# Patient Record
Sex: Male | Born: 1950 | Race: Black or African American | Hispanic: No | Smoking: Never smoker
Health system: Southern US, Community
[De-identification: ages and names within clinical notes are randomized; demographics above are authoritative.]

## PROBLEM LIST (undated history)

## (undated) DIAGNOSIS — N401 Enlarged prostate with lower urinary tract symptoms: Secondary | ICD-10-CM

## (undated) DIAGNOSIS — K3184 Gastroparesis: Secondary | ICD-10-CM

## (undated) DIAGNOSIS — G4730 Sleep apnea, unspecified: Secondary | ICD-10-CM

## (undated) DIAGNOSIS — N183 Chronic kidney disease, stage 3 unspecified: Secondary | ICD-10-CM

## (undated) DIAGNOSIS — M549 Dorsalgia, unspecified: Secondary | ICD-10-CM

## (undated) DIAGNOSIS — T8859XA Other complications of anesthesia, initial encounter: Secondary | ICD-10-CM

## (undated) DIAGNOSIS — K648 Other hemorrhoids: Secondary | ICD-10-CM

## (undated) DIAGNOSIS — E785 Hyperlipidemia, unspecified: Secondary | ICD-10-CM

## (undated) DIAGNOSIS — R42 Dizziness and giddiness: Secondary | ICD-10-CM

## (undated) DIAGNOSIS — I2510 Atherosclerotic heart disease of native coronary artery without angina pectoris: Secondary | ICD-10-CM

## (undated) DIAGNOSIS — I209 Angina pectoris, unspecified: Secondary | ICD-10-CM

## (undated) DIAGNOSIS — R5383 Other fatigue: Secondary | ICD-10-CM

## (undated) DIAGNOSIS — N138 Other obstructive and reflux uropathy: Secondary | ICD-10-CM

## (undated) DIAGNOSIS — I509 Heart failure, unspecified: Secondary | ICD-10-CM

## (undated) DIAGNOSIS — K625 Hemorrhage of anus and rectum: Secondary | ICD-10-CM

## (undated) DIAGNOSIS — T4145XA Adverse effect of unspecified anesthetic, initial encounter: Secondary | ICD-10-CM

## (undated) DIAGNOSIS — R6883 Chills (without fever): Secondary | ICD-10-CM

## (undated) DIAGNOSIS — Z8679 Personal history of other diseases of the circulatory system: Secondary | ICD-10-CM

## (undated) DIAGNOSIS — R0609 Other forms of dyspnea: Secondary | ICD-10-CM

## (undated) DIAGNOSIS — F4310 Post-traumatic stress disorder, unspecified: Secondary | ICD-10-CM

## (undated) DIAGNOSIS — T7840XA Allergy, unspecified, initial encounter: Secondary | ICD-10-CM

## (undated) DIAGNOSIS — C61 Malignant neoplasm of prostate: Secondary | ICD-10-CM

## (undated) DIAGNOSIS — R1310 Dysphagia, unspecified: Secondary | ICD-10-CM

## (undated) DIAGNOSIS — R110 Nausea: Secondary | ICD-10-CM

## (undated) DIAGNOSIS — I639 Cerebral infarction, unspecified: Secondary | ICD-10-CM

## (undated) DIAGNOSIS — I219 Acute myocardial infarction, unspecified: Secondary | ICD-10-CM

## (undated) DIAGNOSIS — R51 Headache: Secondary | ICD-10-CM

## (undated) DIAGNOSIS — R5381 Other malaise: Secondary | ICD-10-CM

## (undated) DIAGNOSIS — E669 Obesity, unspecified: Secondary | ICD-10-CM

## (undated) DIAGNOSIS — R0989 Other specified symptoms and signs involving the circulatory and respiratory systems: Secondary | ICD-10-CM

## (undated) DIAGNOSIS — I10 Essential (primary) hypertension: Secondary | ICD-10-CM

## (undated) DIAGNOSIS — M1990 Unspecified osteoarthritis, unspecified site: Secondary | ICD-10-CM

## (undated) HISTORY — DX: Allergy, unspecified, initial encounter: T78.40XA

## (undated) HISTORY — DX: Hyperlipidemia, unspecified: E78.5

## (undated) HISTORY — DX: Atherosclerotic heart disease of native coronary artery without angina pectoris: I25.10

## (undated) HISTORY — DX: Heart failure, unspecified: I50.9

## (undated) HISTORY — DX: Other hemorrhoids: K64.8

## (undated) HISTORY — DX: Post-traumatic stress disorder, unspecified: F43.10

## (undated) HISTORY — DX: Acute myocardial infarction, unspecified: I21.9

## (undated) HISTORY — DX: Nausea: R11.0

## (undated) HISTORY — PX: PROSTATE BIOPSY: SHX241

## (undated) HISTORY — PX: HEMORRHOID SURGERY: SHX153

## (undated) HISTORY — DX: Unspecified osteoarthritis, unspecified site: M19.90

## (undated) HISTORY — DX: Chronic kidney disease, stage 3 (moderate): N18.3

## (undated) HISTORY — DX: Dizziness and giddiness: R42

## (undated) HISTORY — DX: Other specified symptoms and signs involving the circulatory and respiratory systems: R09.89

## (undated) HISTORY — DX: Other obstructive and reflux uropathy: N13.8

## (undated) HISTORY — PX: AV FISTULA PLACEMENT: SHX1204

## (undated) HISTORY — DX: Benign prostatic hyperplasia with lower urinary tract symptoms: N40.1

## (undated) HISTORY — DX: Cerebral infarction, unspecified: I63.9

## (undated) HISTORY — DX: Chronic kidney disease, stage 3 unspecified: N18.30

## (undated) HISTORY — DX: Chills (without fever): R68.83

## (undated) HISTORY — DX: Other malaise: R53.81

## (undated) HISTORY — DX: Personal history of other diseases of the circulatory system: Z86.79

## (undated) HISTORY — PX: OTHER SURGICAL HISTORY: SHX169

## (undated) HISTORY — PX: ANAL FISSURECTOMY: SUR608

## (undated) HISTORY — DX: Hemorrhage of anus and rectum: K62.5

## (undated) HISTORY — DX: Obesity, unspecified: E66.9

## (undated) HISTORY — DX: Headache: R51

## (undated) HISTORY — DX: Gastroparesis: K31.84

## (undated) HISTORY — DX: Dorsalgia, unspecified: M54.9

## (undated) HISTORY — DX: Dysphagia, unspecified: R13.10

## (undated) HISTORY — DX: Other specified symptoms and signs involving the circulatory and respiratory systems: R06.09

## (undated) HISTORY — DX: Essential (primary) hypertension: I10

## (undated) HISTORY — DX: Other fatigue: R53.83

## (undated) HISTORY — PX: CARDIAC CATHETERIZATION: SHX172

---

## 2000-05-25 DIAGNOSIS — I219 Acute myocardial infarction, unspecified: Secondary | ICD-10-CM

## 2000-05-25 HISTORY — DX: Acute myocardial infarction, unspecified: I21.9

## 2000-06-30 ENCOUNTER — Encounter: Payer: Self-pay | Admitting: Emergency Medicine

## 2000-06-30 ENCOUNTER — Emergency Department (HOSPITAL_COMMUNITY): Admission: EM | Admit: 2000-06-30 | Discharge: 2000-06-30 | Payer: Self-pay | Admitting: Emergency Medicine

## 2000-07-01 ENCOUNTER — Encounter: Payer: Self-pay | Admitting: Family Medicine

## 2000-07-01 ENCOUNTER — Inpatient Hospital Stay (HOSPITAL_COMMUNITY): Admission: EM | Admit: 2000-07-01 | Discharge: 2000-07-05 | Payer: Self-pay | Admitting: Emergency Medicine

## 2000-07-02 ENCOUNTER — Encounter: Payer: Self-pay | Admitting: Internal Medicine

## 2000-07-02 ENCOUNTER — Encounter: Payer: Self-pay | Admitting: Family Medicine

## 2000-07-05 ENCOUNTER — Encounter: Payer: Self-pay | Admitting: Family Medicine

## 2001-03-31 ENCOUNTER — Encounter: Payer: Self-pay | Admitting: Emergency Medicine

## 2001-04-01 ENCOUNTER — Inpatient Hospital Stay (HOSPITAL_COMMUNITY): Admission: EM | Admit: 2001-04-01 | Discharge: 2001-04-03 | Payer: Self-pay | Admitting: Internal Medicine

## 2001-07-13 ENCOUNTER — Inpatient Hospital Stay (HOSPITAL_COMMUNITY): Admission: EM | Admit: 2001-07-13 | Discharge: 2001-07-15 | Payer: Self-pay | Admitting: Emergency Medicine

## 2001-07-13 ENCOUNTER — Encounter: Payer: Self-pay | Admitting: Emergency Medicine

## 2002-01-11 ENCOUNTER — Encounter: Payer: Self-pay | Admitting: Gastroenterology

## 2002-01-19 ENCOUNTER — Encounter: Payer: Self-pay | Admitting: *Deleted

## 2002-01-19 ENCOUNTER — Ambulatory Visit (HOSPITAL_COMMUNITY): Admission: RE | Admit: 2002-01-19 | Discharge: 2002-01-19 | Payer: Self-pay | Admitting: Gastroenterology

## 2002-01-19 ENCOUNTER — Emergency Department (HOSPITAL_COMMUNITY): Admission: EM | Admit: 2002-01-19 | Discharge: 2002-01-19 | Payer: Self-pay | Admitting: Emergency Medicine

## 2002-01-20 ENCOUNTER — Encounter: Payer: Self-pay | Admitting: *Deleted

## 2002-05-07 ENCOUNTER — Emergency Department (HOSPITAL_COMMUNITY): Admission: EM | Admit: 2002-05-07 | Discharge: 2002-05-07 | Payer: Self-pay | Admitting: Emergency Medicine

## 2002-05-07 ENCOUNTER — Encounter: Payer: Self-pay | Admitting: Emergency Medicine

## 2002-06-27 ENCOUNTER — Inpatient Hospital Stay (HOSPITAL_COMMUNITY): Admission: AD | Admit: 2002-06-27 | Discharge: 2002-06-28 | Payer: Self-pay | Admitting: Cardiology

## 2002-06-27 ENCOUNTER — Encounter: Payer: Self-pay | Admitting: Cardiology

## 2002-06-28 ENCOUNTER — Encounter: Payer: Self-pay | Admitting: Cardiology

## 2002-08-12 ENCOUNTER — Ambulatory Visit (HOSPITAL_COMMUNITY): Admission: RE | Admit: 2002-08-12 | Discharge: 2002-08-12 | Payer: Self-pay | Admitting: General Surgery

## 2002-09-04 ENCOUNTER — Encounter (HOSPITAL_COMMUNITY): Admission: RE | Admit: 2002-09-04 | Discharge: 2002-12-03 | Payer: Self-pay | Admitting: Cardiology

## 2002-12-04 ENCOUNTER — Encounter (HOSPITAL_COMMUNITY): Admission: RE | Admit: 2002-12-04 | Discharge: 2003-02-23 | Payer: Self-pay | Admitting: Cardiology

## 2002-12-29 ENCOUNTER — Encounter: Payer: Self-pay | Admitting: Emergency Medicine

## 2002-12-29 ENCOUNTER — Inpatient Hospital Stay (HOSPITAL_COMMUNITY): Admission: EM | Admit: 2002-12-29 | Discharge: 2003-01-01 | Payer: Self-pay | Admitting: Emergency Medicine

## 2003-01-01 ENCOUNTER — Encounter: Payer: Self-pay | Admitting: Cardiology

## 2003-03-12 ENCOUNTER — Encounter: Payer: Self-pay | Admitting: Emergency Medicine

## 2003-03-12 ENCOUNTER — Emergency Department (HOSPITAL_COMMUNITY): Admission: EM | Admit: 2003-03-12 | Discharge: 2003-03-12 | Payer: Self-pay | Admitting: Emergency Medicine

## 2003-05-01 ENCOUNTER — Ambulatory Visit (HOSPITAL_COMMUNITY): Admission: RE | Admit: 2003-05-01 | Discharge: 2003-05-01 | Payer: Self-pay | Admitting: Gastroenterology

## 2003-05-09 ENCOUNTER — Ambulatory Visit (HOSPITAL_COMMUNITY): Admission: RE | Admit: 2003-05-09 | Discharge: 2003-05-09 | Payer: Self-pay | Admitting: Gastroenterology

## 2003-07-16 ENCOUNTER — Ambulatory Visit (HOSPITAL_COMMUNITY): Admission: RE | Admit: 2003-07-16 | Discharge: 2003-07-16 | Payer: Self-pay | Admitting: Gastroenterology

## 2003-10-18 ENCOUNTER — Ambulatory Visit (HOSPITAL_COMMUNITY): Admission: RE | Admit: 2003-10-18 | Discharge: 2003-10-18 | Payer: Self-pay | Admitting: Cardiology

## 2003-11-15 ENCOUNTER — Ambulatory Visit (HOSPITAL_COMMUNITY): Admission: RE | Admit: 2003-11-15 | Discharge: 2003-11-15 | Payer: Self-pay | Admitting: General Surgery

## 2003-11-23 ENCOUNTER — Ambulatory Visit (HOSPITAL_COMMUNITY): Admission: RE | Admit: 2003-11-23 | Discharge: 2003-11-23 | Payer: Self-pay | Admitting: Cardiology

## 2004-01-03 ENCOUNTER — Ambulatory Visit (HOSPITAL_BASED_OUTPATIENT_CLINIC_OR_DEPARTMENT_OTHER): Admission: RE | Admit: 2004-01-03 | Discharge: 2004-01-03 | Payer: Self-pay | Admitting: Internal Medicine

## 2004-02-11 ENCOUNTER — Ambulatory Visit (HOSPITAL_COMMUNITY): Admission: RE | Admit: 2004-02-11 | Discharge: 2004-02-11 | Payer: Self-pay | Admitting: General Surgery

## 2004-03-25 ENCOUNTER — Ambulatory Visit: Payer: Self-pay | Admitting: Internal Medicine

## 2004-04-01 ENCOUNTER — Encounter: Admission: RE | Admit: 2004-04-01 | Discharge: 2004-04-01 | Payer: Self-pay | Admitting: Neurology

## 2004-04-10 ENCOUNTER — Encounter: Admission: RE | Admit: 2004-04-10 | Discharge: 2004-04-10 | Payer: Self-pay | Admitting: Neurology

## 2004-04-28 ENCOUNTER — Encounter: Admission: RE | Admit: 2004-04-28 | Discharge: 2004-04-28 | Payer: Self-pay | Admitting: Neurology

## 2004-05-12 ENCOUNTER — Ambulatory Visit: Payer: Self-pay | Admitting: Internal Medicine

## 2004-06-02 ENCOUNTER — Ambulatory Visit: Payer: Self-pay | Admitting: Internal Medicine

## 2004-06-09 ENCOUNTER — Ambulatory Visit: Payer: Self-pay | Admitting: Internal Medicine

## 2004-06-16 ENCOUNTER — Ambulatory Visit (HOSPITAL_BASED_OUTPATIENT_CLINIC_OR_DEPARTMENT_OTHER): Admission: RE | Admit: 2004-06-16 | Discharge: 2004-06-16 | Payer: Self-pay | Admitting: Internal Medicine

## 2004-06-16 ENCOUNTER — Ambulatory Visit: Payer: Self-pay | Admitting: Pulmonary Disease

## 2004-06-17 ENCOUNTER — Ambulatory Visit: Payer: Self-pay | Admitting: Cardiology

## 2004-07-01 ENCOUNTER — Ambulatory Visit: Payer: Self-pay | Admitting: Internal Medicine

## 2004-07-12 ENCOUNTER — Ambulatory Visit: Payer: Self-pay | Admitting: Cardiology

## 2004-07-13 ENCOUNTER — Inpatient Hospital Stay (HOSPITAL_COMMUNITY): Admission: EM | Admit: 2004-07-13 | Discharge: 2004-07-16 | Payer: Self-pay | Admitting: Emergency Medicine

## 2004-07-15 ENCOUNTER — Encounter: Payer: Self-pay | Admitting: Cardiology

## 2004-07-18 ENCOUNTER — Ambulatory Visit: Payer: Self-pay | Admitting: Internal Medicine

## 2004-07-25 ENCOUNTER — Ambulatory Visit: Payer: Self-pay | Admitting: Cardiology

## 2004-07-30 ENCOUNTER — Ambulatory Visit: Payer: Self-pay | Admitting: Internal Medicine

## 2004-08-04 ENCOUNTER — Encounter: Admission: RE | Admit: 2004-08-04 | Discharge: 2004-08-04 | Payer: Self-pay | Admitting: Internal Medicine

## 2004-08-20 ENCOUNTER — Ambulatory Visit: Payer: Self-pay | Admitting: Internal Medicine

## 2004-08-27 ENCOUNTER — Ambulatory Visit: Payer: Self-pay | Admitting: Cardiology

## 2004-09-03 ENCOUNTER — Ambulatory Visit: Payer: Self-pay | Admitting: Internal Medicine

## 2004-09-09 ENCOUNTER — Ambulatory Visit: Payer: Self-pay | Admitting: Cardiology

## 2004-09-22 ENCOUNTER — Ambulatory Visit: Payer: Self-pay | Admitting: Cardiology

## 2004-09-23 ENCOUNTER — Ambulatory Visit: Payer: Self-pay | Admitting: Internal Medicine

## 2004-10-16 ENCOUNTER — Ambulatory Visit: Payer: Self-pay | Admitting: Cardiology

## 2004-11-24 ENCOUNTER — Ambulatory Visit: Payer: Self-pay | Admitting: Internal Medicine

## 2004-12-25 ENCOUNTER — Ambulatory Visit: Payer: Self-pay | Admitting: Internal Medicine

## 2005-02-05 ENCOUNTER — Ambulatory Visit: Payer: Self-pay | Admitting: Internal Medicine

## 2005-02-24 ENCOUNTER — Ambulatory Visit: Payer: Self-pay | Admitting: Cardiology

## 2005-02-25 ENCOUNTER — Ambulatory Visit: Payer: Self-pay

## 2005-04-03 ENCOUNTER — Ambulatory Visit: Payer: Self-pay | Admitting: Internal Medicine

## 2005-04-06 ENCOUNTER — Ambulatory Visit: Payer: Self-pay | Admitting: Cardiology

## 2005-04-07 ENCOUNTER — Ambulatory Visit: Payer: Self-pay | Admitting: Internal Medicine

## 2005-05-07 ENCOUNTER — Ambulatory Visit: Payer: Self-pay | Admitting: Internal Medicine

## 2005-05-28 ENCOUNTER — Ambulatory Visit: Payer: Self-pay | Admitting: Internal Medicine

## 2005-06-22 ENCOUNTER — Ambulatory Visit: Payer: Self-pay | Admitting: Cardiology

## 2005-07-07 ENCOUNTER — Ambulatory Visit: Payer: Self-pay | Admitting: Internal Medicine

## 2005-07-27 ENCOUNTER — Ambulatory Visit: Payer: Self-pay | Admitting: Internal Medicine

## 2005-08-03 ENCOUNTER — Ambulatory Visit: Payer: Self-pay | Admitting: Internal Medicine

## 2005-08-17 ENCOUNTER — Ambulatory Visit: Payer: Self-pay | Admitting: Internal Medicine

## 2005-09-03 ENCOUNTER — Ambulatory Visit: Payer: Self-pay | Admitting: Cardiology

## 2005-09-21 ENCOUNTER — Ambulatory Visit: Payer: Self-pay | Admitting: Cardiology

## 2005-09-21 ENCOUNTER — Inpatient Hospital Stay (HOSPITAL_COMMUNITY): Admission: EM | Admit: 2005-09-21 | Discharge: 2005-09-26 | Payer: Self-pay | Admitting: Emergency Medicine

## 2005-09-24 ENCOUNTER — Ambulatory Visit: Payer: Self-pay | Admitting: Physical Medicine & Rehabilitation

## 2005-10-01 ENCOUNTER — Encounter: Admission: RE | Admit: 2005-10-01 | Discharge: 2005-11-26 | Payer: Self-pay | Admitting: Neurology

## 2005-10-15 ENCOUNTER — Ambulatory Visit: Payer: Self-pay | Admitting: Cardiology

## 2005-10-15 ENCOUNTER — Ambulatory Visit: Payer: Self-pay | Admitting: Internal Medicine

## 2005-12-01 ENCOUNTER — Encounter: Admission: RE | Admit: 2005-12-01 | Discharge: 2006-03-01 | Payer: Self-pay | Admitting: Neurology

## 2005-12-16 ENCOUNTER — Ambulatory Visit: Payer: Self-pay | Admitting: Internal Medicine

## 2005-12-16 ENCOUNTER — Encounter: Admission: RE | Admit: 2005-12-16 | Discharge: 2005-12-16 | Payer: Self-pay | Admitting: Internal Medicine

## 2006-02-16 ENCOUNTER — Ambulatory Visit: Payer: Self-pay | Admitting: Internal Medicine

## 2006-03-02 ENCOUNTER — Encounter: Admission: RE | Admit: 2006-03-02 | Discharge: 2006-04-13 | Payer: Self-pay | Admitting: Neurology

## 2006-04-27 ENCOUNTER — Ambulatory Visit: Payer: Self-pay | Admitting: Internal Medicine

## 2006-04-27 LAB — CONVERTED CEMR LAB
ALT: 19 units/L (ref 0–40)
AST: 19 units/L (ref 0–37)
Albumin: 4 g/dL (ref 3.5–5.2)
Alkaline Phosphatase: 70 units/L (ref 39–117)
VLDL: 75 mg/dL — ABNORMAL HIGH (ref 0–40)

## 2006-05-10 ENCOUNTER — Ambulatory Visit: Payer: Self-pay | Admitting: Cardiology

## 2006-06-14 ENCOUNTER — Ambulatory Visit: Payer: Self-pay | Admitting: Internal Medicine

## 2006-06-15 LAB — CONVERTED CEMR LAB
Creatinine, Ser: 2.2 mg/dL — ABNORMAL HIGH (ref 0.4–1.5)
Potassium: 3.8 meq/L (ref 3.5–5.1)

## 2006-08-12 ENCOUNTER — Ambulatory Visit: Payer: Self-pay | Admitting: Internal Medicine

## 2006-11-17 ENCOUNTER — Ambulatory Visit: Payer: Self-pay | Admitting: Cardiology

## 2006-12-02 ENCOUNTER — Encounter: Payer: Self-pay | Admitting: Internal Medicine

## 2006-12-27 ENCOUNTER — Encounter: Payer: Self-pay | Admitting: Internal Medicine

## 2006-12-30 ENCOUNTER — Encounter: Payer: Self-pay | Admitting: Internal Medicine

## 2007-01-17 ENCOUNTER — Ambulatory Visit: Payer: Self-pay | Admitting: Internal Medicine

## 2007-01-17 DIAGNOSIS — Z8673 Personal history of transient ischemic attack (TIA), and cerebral infarction without residual deficits: Secondary | ICD-10-CM | POA: Insufficient documentation

## 2007-01-17 DIAGNOSIS — I639 Cerebral infarction, unspecified: Secondary | ICD-10-CM | POA: Insufficient documentation

## 2007-01-17 DIAGNOSIS — I10 Essential (primary) hypertension: Secondary | ICD-10-CM | POA: Insufficient documentation

## 2007-01-31 ENCOUNTER — Ambulatory Visit: Payer: Self-pay

## 2007-02-02 ENCOUNTER — Ambulatory Visit: Payer: Self-pay

## 2007-02-18 ENCOUNTER — Ambulatory Visit: Payer: Self-pay | Admitting: Cardiology

## 2007-02-28 ENCOUNTER — Ambulatory Visit: Payer: Self-pay

## 2007-03-01 ENCOUNTER — Ambulatory Visit: Payer: Self-pay | Admitting: Internal Medicine

## 2007-03-01 DIAGNOSIS — J309 Allergic rhinitis, unspecified: Secondary | ICD-10-CM | POA: Insufficient documentation

## 2007-03-29 ENCOUNTER — Telehealth (INDEPENDENT_AMBULATORY_CARE_PROVIDER_SITE_OTHER): Payer: Self-pay | Admitting: *Deleted

## 2007-04-12 ENCOUNTER — Ambulatory Visit: Payer: Self-pay | Admitting: Cardiology

## 2007-06-09 ENCOUNTER — Ambulatory Visit: Payer: Self-pay | Admitting: Internal Medicine

## 2007-06-21 ENCOUNTER — Encounter: Payer: Self-pay | Admitting: Internal Medicine

## 2007-06-22 ENCOUNTER — Encounter: Payer: Self-pay | Admitting: Internal Medicine

## 2007-07-13 ENCOUNTER — Encounter: Payer: Self-pay | Admitting: Internal Medicine

## 2007-07-14 ENCOUNTER — Ambulatory Visit: Payer: Self-pay | Admitting: Gastroenterology

## 2007-07-26 ENCOUNTER — Ambulatory Visit (HOSPITAL_COMMUNITY): Admission: RE | Admit: 2007-07-26 | Discharge: 2007-07-26 | Payer: Self-pay | Admitting: Gastroenterology

## 2007-07-26 ENCOUNTER — Encounter: Payer: Self-pay | Admitting: Internal Medicine

## 2007-08-12 ENCOUNTER — Ambulatory Visit: Payer: Self-pay | Admitting: Gastroenterology

## 2007-08-18 ENCOUNTER — Ambulatory Visit (HOSPITAL_COMMUNITY): Admission: RE | Admit: 2007-08-18 | Discharge: 2007-08-18 | Payer: Self-pay | Admitting: Gastroenterology

## 2007-08-25 ENCOUNTER — Encounter: Payer: Self-pay | Admitting: Internal Medicine

## 2007-08-31 ENCOUNTER — Ambulatory Visit: Payer: Self-pay | Admitting: Gastroenterology

## 2007-09-05 ENCOUNTER — Ambulatory Visit: Payer: Self-pay | Admitting: Internal Medicine

## 2007-09-05 DIAGNOSIS — E0843 Diabetes mellitus due to underlying condition with diabetic autonomic (poly)neuropathy: Secondary | ICD-10-CM | POA: Insufficient documentation

## 2007-09-23 ENCOUNTER — Telehealth: Payer: Self-pay | Admitting: Gastroenterology

## 2007-09-23 ENCOUNTER — Encounter: Payer: Self-pay | Admitting: Gastroenterology

## 2007-10-19 ENCOUNTER — Ambulatory Visit: Payer: Self-pay | Admitting: Cardiology

## 2007-10-24 ENCOUNTER — Telehealth: Payer: Self-pay | Admitting: Internal Medicine

## 2007-11-03 ENCOUNTER — Ambulatory Visit: Payer: Self-pay | Admitting: Internal Medicine

## 2007-11-03 DIAGNOSIS — F4310 Post-traumatic stress disorder, unspecified: Secondary | ICD-10-CM | POA: Insufficient documentation

## 2007-11-03 LAB — CONVERTED CEMR LAB
Chloride: 99 meq/L (ref 96–112)
Creatinine, Ser: 2 mg/dL — ABNORMAL HIGH (ref 0.4–1.5)
Creatinine,U: 95.7 mg/dL
GFR calc non Af Amer: 37 mL/min
Hgb A1c MFr Bld: 8.6 % — ABNORMAL HIGH (ref 4.6–6.0)
Microalb Creat Ratio: 395 mg/g — ABNORMAL HIGH (ref 0.0–30.0)
Potassium: 3.2 meq/L — ABNORMAL LOW (ref 3.5–5.1)

## 2007-12-15 ENCOUNTER — Ambulatory Visit: Payer: Self-pay | Admitting: Internal Medicine

## 2007-12-15 DIAGNOSIS — M545 Low back pain, unspecified: Secondary | ICD-10-CM | POA: Insufficient documentation

## 2007-12-16 ENCOUNTER — Telehealth: Payer: Self-pay | Admitting: Internal Medicine

## 2007-12-21 ENCOUNTER — Encounter: Payer: Self-pay | Admitting: Internal Medicine

## 2007-12-28 ENCOUNTER — Encounter: Payer: Self-pay | Admitting: Internal Medicine

## 2008-01-11 ENCOUNTER — Encounter: Payer: Self-pay | Admitting: Internal Medicine

## 2008-01-16 ENCOUNTER — Ambulatory Visit (HOSPITAL_COMMUNITY): Admission: RE | Admit: 2008-01-16 | Discharge: 2008-01-16 | Payer: Self-pay | Admitting: Sports Medicine

## 2008-01-23 ENCOUNTER — Encounter: Admission: RE | Admit: 2008-01-23 | Discharge: 2008-01-23 | Payer: Self-pay | Admitting: Sports Medicine

## 2008-02-20 ENCOUNTER — Ambulatory Visit: Payer: Self-pay | Admitting: Internal Medicine

## 2008-04-11 ENCOUNTER — Ambulatory Visit: Payer: Self-pay | Admitting: Internal Medicine

## 2008-04-11 LAB — CONVERTED CEMR LAB
ALT: 14 units/L (ref 0–53)
AST: 16 units/L (ref 0–37)
Albumin: 3.5 g/dL (ref 3.5–5.2)
BUN: 29 mg/dL — ABNORMAL HIGH (ref 6–23)
Basophils Relative: 0.4 % (ref 0.0–3.0)
CO2: 27 meq/L (ref 19–32)
Chloride: 104 meq/L (ref 96–112)
Creatinine, Ser: 2.3 mg/dL — ABNORMAL HIGH (ref 0.4–1.5)
Eosinophils Absolute: 0.2 10*3/uL (ref 0.0–0.7)
Eosinophils Relative: 4.4 % (ref 0.0–5.0)
GFR calc non Af Amer: 31 mL/min
Glucose, Bld: 190 mg/dL — ABNORMAL HIGH (ref 70–99)
Hgb A1c MFr Bld: 8.9 % — ABNORMAL HIGH (ref 4.6–6.0)
MCV: 82.4 fL (ref 78.0–100.0)
Monocytes Relative: 9.5 % (ref 3.0–12.0)
Neutrophils Relative %: 63.5 % (ref 43.0–77.0)
RBC: 5.09 M/uL (ref 4.22–5.81)
Total Protein: 6.8 g/dL (ref 6.0–8.3)
VLDL: 36 mg/dL (ref 0–40)
WBC: 5.2 10*3/uL (ref 4.5–10.5)

## 2008-04-18 ENCOUNTER — Ambulatory Visit: Payer: Self-pay | Admitting: Internal Medicine

## 2008-06-19 ENCOUNTER — Ambulatory Visit: Payer: Self-pay | Admitting: Internal Medicine

## 2008-06-19 DIAGNOSIS — K3184 Gastroparesis: Secondary | ICD-10-CM | POA: Insufficient documentation

## 2008-06-25 LAB — CONVERTED CEMR LAB
BUN: 18 mg/dL (ref 6–23)
Calcium: 9.1 mg/dL (ref 8.4–10.5)
Creatinine, Ser: 2 mg/dL — ABNORMAL HIGH (ref 0.4–1.5)
GFR calc Af Amer: 44 mL/min
GFR calc non Af Amer: 37 mL/min
Hgb A1c MFr Bld: 9.3 % — ABNORMAL HIGH (ref 4.6–6.0)

## 2008-07-09 ENCOUNTER — Telehealth: Payer: Self-pay | Admitting: Internal Medicine

## 2008-07-11 ENCOUNTER — Ambulatory Visit: Payer: Self-pay | Admitting: Cardiology

## 2008-07-25 ENCOUNTER — Ambulatory Visit: Payer: Self-pay | Admitting: Internal Medicine

## 2008-09-18 ENCOUNTER — Ambulatory Visit: Payer: Self-pay | Admitting: Internal Medicine

## 2008-09-18 LAB — CONVERTED CEMR LAB
BUN: 21 mg/dL (ref 6–23)
Bilirubin, Direct: 0 mg/dL (ref 0.0–0.3)
Calcium: 8.9 mg/dL (ref 8.4–10.5)
Cholesterol: 142 mg/dL (ref 0–200)
Creatinine, Ser: 2.4 mg/dL — ABNORMAL HIGH (ref 0.4–1.5)
HDL: 35.8 mg/dL — ABNORMAL LOW (ref >39.00)
Hgb A1c MFr Bld: 8.2 % — ABNORMAL HIGH (ref 4.6–6.5)
Total Bilirubin: 0.8 mg/dL (ref 0.3–1.2)
Total CHOL/HDL Ratio: 4
Total Protein: 6.7 g/dL (ref 6.0–8.3)
Triglycerides: 252 mg/dL — ABNORMAL HIGH (ref 0.0–149.0)

## 2008-09-25 ENCOUNTER — Ambulatory Visit: Payer: Self-pay | Admitting: Internal Medicine

## 2008-09-25 DIAGNOSIS — R110 Nausea: Secondary | ICD-10-CM | POA: Insufficient documentation

## 2008-09-25 DIAGNOSIS — N400 Enlarged prostate without lower urinary tract symptoms: Secondary | ICD-10-CM | POA: Insufficient documentation

## 2008-09-25 LAB — CONVERTED CEMR LAB: Lipase: 16 units/L (ref 11.0–59.0)

## 2008-09-27 ENCOUNTER — Telehealth: Payer: Self-pay | Admitting: Speech Pathology

## 2008-10-16 DIAGNOSIS — K648 Other hemorrhoids: Secondary | ICD-10-CM

## 2008-10-16 HISTORY — DX: Other hemorrhoids: K64.8

## 2008-10-17 ENCOUNTER — Encounter: Payer: Self-pay | Admitting: Cardiology

## 2008-10-17 ENCOUNTER — Encounter: Payer: Self-pay | Admitting: Internal Medicine

## 2008-10-18 ENCOUNTER — Ambulatory Visit: Payer: Self-pay | Admitting: Gastroenterology

## 2008-10-18 DIAGNOSIS — R1310 Dysphagia, unspecified: Secondary | ICD-10-CM | POA: Insufficient documentation

## 2008-10-30 ENCOUNTER — Telehealth: Payer: Self-pay | Admitting: Gastroenterology

## 2008-11-16 ENCOUNTER — Encounter: Payer: Self-pay | Admitting: Internal Medicine

## 2008-11-16 ENCOUNTER — Ambulatory Visit (HOSPITAL_COMMUNITY): Admission: RE | Admit: 2008-11-16 | Discharge: 2008-11-16 | Payer: Self-pay | Admitting: Gastroenterology

## 2008-11-16 ENCOUNTER — Ambulatory Visit: Payer: Self-pay | Admitting: Gastroenterology

## 2008-11-19 ENCOUNTER — Ambulatory Visit: Payer: Self-pay | Admitting: Surgery

## 2008-12-05 ENCOUNTER — Telehealth: Payer: Self-pay | Admitting: Gastroenterology

## 2008-12-13 ENCOUNTER — Encounter (INDEPENDENT_AMBULATORY_CARE_PROVIDER_SITE_OTHER): Payer: Self-pay | Admitting: *Deleted

## 2008-12-17 ENCOUNTER — Encounter: Payer: Self-pay | Admitting: Cardiology

## 2008-12-17 ENCOUNTER — Ambulatory Visit: Payer: Self-pay | Admitting: Gastroenterology

## 2008-12-17 ENCOUNTER — Encounter: Payer: Self-pay | Admitting: Internal Medicine

## 2008-12-18 ENCOUNTER — Ambulatory Visit: Payer: Self-pay | Admitting: Vascular Surgery

## 2009-01-08 ENCOUNTER — Ambulatory Visit: Payer: Self-pay | Admitting: Cardiology

## 2009-01-08 DIAGNOSIS — R079 Chest pain, unspecified: Secondary | ICD-10-CM | POA: Insufficient documentation

## 2009-01-14 ENCOUNTER — Encounter: Payer: Self-pay | Admitting: Cardiology

## 2009-01-16 ENCOUNTER — Telehealth (INDEPENDENT_AMBULATORY_CARE_PROVIDER_SITE_OTHER): Payer: Self-pay

## 2009-01-17 ENCOUNTER — Encounter: Payer: Self-pay | Admitting: Cardiology

## 2009-01-17 ENCOUNTER — Ambulatory Visit: Payer: Self-pay

## 2009-02-04 ENCOUNTER — Ambulatory Visit: Payer: Self-pay | Admitting: Gastroenterology

## 2009-02-27 ENCOUNTER — Ambulatory Visit: Payer: Self-pay | Admitting: Internal Medicine

## 2009-02-27 DIAGNOSIS — N186 End stage renal disease: Secondary | ICD-10-CM | POA: Insufficient documentation

## 2009-02-27 DIAGNOSIS — Z992 Dependence on renal dialysis: Secondary | ICD-10-CM

## 2009-02-27 LAB — CONVERTED CEMR LAB
CO2: 29 meq/L (ref 19–32)
Calcium: 8.9 mg/dL (ref 8.4–10.5)
Chloride: 102 meq/L (ref 96–112)
Creatinine,U: 66.8 mg/dL
Glucose, Bld: 107 mg/dL — ABNORMAL HIGH (ref 70–99)
Microalb, Ur: 32.5 mg/dL — ABNORMAL HIGH (ref 0.0–1.9)
Sodium: 141 meq/L (ref 135–145)

## 2009-03-06 ENCOUNTER — Encounter (INDEPENDENT_AMBULATORY_CARE_PROVIDER_SITE_OTHER): Payer: Self-pay | Admitting: *Deleted

## 2009-03-06 ENCOUNTER — Encounter: Payer: Self-pay | Admitting: Cardiology

## 2009-03-21 ENCOUNTER — Telehealth (INDEPENDENT_AMBULATORY_CARE_PROVIDER_SITE_OTHER): Payer: Self-pay | Admitting: *Deleted

## 2009-03-22 ENCOUNTER — Ambulatory Visit (HOSPITAL_COMMUNITY): Admission: RE | Admit: 2009-03-22 | Discharge: 2009-03-22 | Payer: Self-pay | Admitting: Vascular Surgery

## 2009-03-22 ENCOUNTER — Ambulatory Visit: Payer: Self-pay | Admitting: Vascular Surgery

## 2009-03-26 ENCOUNTER — Encounter: Admission: RE | Admit: 2009-03-26 | Discharge: 2009-03-26 | Payer: Self-pay | Admitting: Sports Medicine

## 2009-03-27 ENCOUNTER — Ambulatory Visit: Payer: Self-pay | Admitting: Internal Medicine

## 2009-03-27 DIAGNOSIS — K625 Hemorrhage of anus and rectum: Secondary | ICD-10-CM | POA: Insufficient documentation

## 2009-03-27 LAB — HM DIABETES FOOT EXAM

## 2009-04-08 ENCOUNTER — Ambulatory Visit: Payer: Self-pay | Admitting: Cardiology

## 2009-04-08 ENCOUNTER — Telehealth: Payer: Self-pay | Admitting: Cardiology

## 2009-04-08 DIAGNOSIS — R5381 Other malaise: Secondary | ICD-10-CM | POA: Insufficient documentation

## 2009-04-08 DIAGNOSIS — R5383 Other fatigue: Secondary | ICD-10-CM

## 2009-04-09 DIAGNOSIS — I2510 Atherosclerotic heart disease of native coronary artery without angina pectoris: Secondary | ICD-10-CM | POA: Insufficient documentation

## 2009-04-15 ENCOUNTER — Telehealth (INDEPENDENT_AMBULATORY_CARE_PROVIDER_SITE_OTHER): Payer: Self-pay | Admitting: *Deleted

## 2009-04-19 ENCOUNTER — Ambulatory Visit (HOSPITAL_COMMUNITY): Admission: RE | Admit: 2009-04-19 | Discharge: 2009-04-19 | Payer: Self-pay | Admitting: Cardiology

## 2009-04-19 ENCOUNTER — Ambulatory Visit: Payer: Self-pay

## 2009-04-19 ENCOUNTER — Ambulatory Visit: Payer: Self-pay | Admitting: Internal Medicine

## 2009-04-19 ENCOUNTER — Ambulatory Visit: Payer: Self-pay | Admitting: Cardiology

## 2009-04-19 ENCOUNTER — Encounter: Payer: Self-pay | Admitting: Cardiology

## 2009-04-22 ENCOUNTER — Ambulatory Visit: Payer: Self-pay | Admitting: Cardiology

## 2009-04-23 LAB — CONVERTED CEMR LAB
AST: 11 units/L (ref 0–37)
Albumin: 3.7 g/dL (ref 3.5–5.2)
Bilirubin, Direct: 0.1 mg/dL (ref 0.0–0.3)
CO2: 30 meq/L (ref 19–32)
Calcium: 8.6 mg/dL (ref 8.4–10.5)
Chloride: 103 meq/L (ref 96–112)
Glucose, Bld: 101 mg/dL — ABNORMAL HIGH (ref 70–99)
HDL: 36 mg/dL — ABNORMAL LOW (ref >39)
Hemoglobin: 14.3 g/dL (ref 13.0–17.0)
LDL Cholesterol: 56 mg/dL (ref 0–99)
Lymphocytes Relative: 30 % (ref 12–46)
Lymphs Abs: 1 10*3/uL (ref 0.7–4.0)
MCHC: 32.7 g/dL (ref 30.0–36.0)
Monocytes Absolute: 0.4 10*3/uL (ref 0.1–1.0)
Monocytes Relative: 10 % (ref 3–12)
Neutro Abs: 2 10*3/uL (ref 1.7–7.7)
Neutrophils Relative %: 56 % (ref 43–77)
Potassium: 4 meq/L (ref 3.5–5.3)
Pro B Natriuretic peptide (BNP): 47 pg/mL (ref 0.0–100.0)
RBC: 5.36 M/uL (ref 4.22–5.81)
Sodium: 140 meq/L (ref 135–145)
TSH: 1.677 microintl units/mL (ref 0.350–4.500)
Total Bilirubin: 0.5 mg/dL (ref 0.3–1.2)
Total CHOL/HDL Ratio: 3.2
VLDL: 24 mg/dL (ref 0–40)
WBC: 3.6 10*3/uL — ABNORMAL LOW (ref 4.0–10.5)

## 2009-04-24 LAB — HM DIABETES EYE EXAM

## 2009-04-26 ENCOUNTER — Encounter (INDEPENDENT_AMBULATORY_CARE_PROVIDER_SITE_OTHER): Payer: Self-pay | Admitting: *Deleted

## 2009-05-07 ENCOUNTER — Ambulatory Visit: Payer: Self-pay | Admitting: Vascular Surgery

## 2009-06-28 ENCOUNTER — Encounter: Payer: Self-pay | Admitting: Cardiology

## 2009-06-28 ENCOUNTER — Encounter: Payer: Self-pay | Admitting: Internal Medicine

## 2009-07-10 ENCOUNTER — Ambulatory Visit: Payer: Self-pay | Admitting: Internal Medicine

## 2009-07-10 DIAGNOSIS — G4733 Obstructive sleep apnea (adult) (pediatric): Secondary | ICD-10-CM | POA: Insufficient documentation

## 2009-07-10 DIAGNOSIS — E291 Testicular hypofunction: Secondary | ICD-10-CM | POA: Insufficient documentation

## 2009-07-10 LAB — CONVERTED CEMR LAB
BUN: 30 mg/dL — ABNORMAL HIGH (ref 6–23)
Creatinine, Ser: 3.2 mg/dL — ABNORMAL HIGH (ref 0.4–1.5)
GFR calc non Af Amer: 25.55 mL/min (ref >60)
Hgb A1c MFr Bld: 7.9 % — ABNORMAL HIGH (ref 4.6–6.5)
Potassium: 4 meq/L (ref 3.5–5.1)
Testosterone: 159.69 ng/dL — ABNORMAL LOW (ref 350.00–890.00)

## 2009-09-25 ENCOUNTER — Encounter: Payer: Self-pay | Admitting: Internal Medicine

## 2009-09-25 ENCOUNTER — Encounter: Payer: Self-pay | Admitting: Cardiology

## 2009-11-12 ENCOUNTER — Telehealth: Payer: Self-pay | Admitting: Internal Medicine

## 2009-12-10 ENCOUNTER — Ambulatory Visit: Payer: Self-pay | Admitting: Internal Medicine

## 2009-12-16 LAB — CONVERTED CEMR LAB
BUN: 25 mg/dL — ABNORMAL HIGH (ref 6–23)
Creatinine, Ser: 2.9 mg/dL — ABNORMAL HIGH (ref 0.4–1.5)
GFR calc non Af Amer: 28.7 mL/min (ref >60)
Potassium: 3.9 meq/L (ref 3.5–5.1)

## 2009-12-20 ENCOUNTER — Encounter: Payer: Self-pay | Admitting: Internal Medicine

## 2009-12-22 DIAGNOSIS — M109 Gout, unspecified: Secondary | ICD-10-CM | POA: Insufficient documentation

## 2010-01-06 ENCOUNTER — Encounter: Payer: Self-pay | Admitting: Internal Medicine

## 2010-01-08 ENCOUNTER — Ambulatory Visit (HOSPITAL_BASED_OUTPATIENT_CLINIC_OR_DEPARTMENT_OTHER): Admission: RE | Admit: 2010-01-08 | Discharge: 2010-01-08 | Payer: Self-pay | Admitting: Internal Medicine

## 2010-01-08 ENCOUNTER — Encounter: Payer: Self-pay | Admitting: Internal Medicine

## 2010-01-15 ENCOUNTER — Ambulatory Visit: Payer: Self-pay | Admitting: Cardiology

## 2010-01-16 ENCOUNTER — Ambulatory Visit: Payer: Self-pay | Admitting: Pulmonary Disease

## 2010-01-28 ENCOUNTER — Telehealth: Payer: Self-pay | Admitting: Internal Medicine

## 2010-03-20 ENCOUNTER — Telehealth: Payer: Self-pay | Admitting: Internal Medicine

## 2010-04-25 ENCOUNTER — Telehealth: Payer: Self-pay | Admitting: Internal Medicine

## 2010-04-25 ENCOUNTER — Encounter: Payer: Self-pay | Admitting: Cardiology

## 2010-05-01 ENCOUNTER — Ambulatory Visit: Payer: Self-pay | Admitting: Internal Medicine

## 2010-05-01 LAB — CONVERTED CEMR LAB: Hgb A1c MFr Bld: 15.1 % — ABNORMAL HIGH (ref 4.6–6.5)

## 2010-06-16 ENCOUNTER — Encounter: Payer: Self-pay | Admitting: Sports Medicine

## 2010-06-26 NOTE — Progress Notes (Signed)
Summary: BS running high  Phone Note Call from Patient Call back at [phone] (cell)   Caller: Patient Call For: Stacie Glaze MD Reason for Call: Acute Illness Summary of Call: VM from pt reporting he had appt with nephrologist Dr Gaspar Skeeters this morning.  His fasting glucose was 569.  His appt was 8:45.  He had breakfast at noon and his BS now is 195 after his humulog. Pt has scheduled OV for 12/8, "he feels awful". Initial call taken by: Sid Falcon LPN,  April 25, 2010 3:20 PM  Follow-up for Phone Call        per dr Lovell Sheehan- increase humolog to 40 before breakfast ,lunch and dinner Follow-up by: Willy Eddy, LPN,  April 25, 2010 4:07 PM  Additional Follow-up for Phone Call Additional follow up Details #1::        pt informed me when I called him he had not taken his humalog thel night before becaiuse he didnt eat supper,but he did take his lantus Additional Follow-up by: Willy Eddy, LPN,  April 25, 2010 4:45 PM

## 2010-06-26 NOTE — Letter (Signed)
Summary: [facility] Office Note  [facility] Office Note   Imported By: Roderic Ovens 10/17/2009 15:32:38  _____________________________________________________________________  External Attachment:    Type:   Image     Comment:   External Document

## 2010-06-26 NOTE — Letter (Signed)
Summary: [facility]  [facility]   Imported By: Maryln Gottron 11/07/2009 15:26:47  _____________________________________________________________________  External Attachment:    Type:   Image     Comment:   External Document

## 2010-06-26 NOTE — Letter (Signed)
Summary: [facility]  [facility]   Imported By: Maryln Gottron 10/17/2009 15:04:50  _____________________________________________________________________  External Attachment:    Type:   Image     Comment:   External Document

## 2010-06-26 NOTE — Assessment & Plan Note (Signed)
Summary: 2 mo rov/mm----PT [facility] // RS   Vital Signs:  Patient profile:   60 year old male Height:      70 inches Weight:      292 pounds BMI:     42.05 Temp:     98.2 degrees F oral Pulse rate:   72 / minute Resp:     14 per minute BP sitting:   124 / 70  (left arm) Cuff size:   large  Vitals Entered By: Willy Eddy, LPN (July 10, 2009 10:39 AM) CC: roa   Primary Care Provider:  Darryll Capers, MD  CC:  roa.  History of Present Illness: The pt has seen two specialty clinics with [place] of lipids, liver, BNP creatinine that has been stable He is frustrated by his lack of energy, he reports episodic hypoglycemia and increased frequencies if he skip meals of is late to eat. his weight still is an issue the chest pain have largely stopped but his activity is low the notes from Dr's wall and renal are reviewed with the patient  Preventive Screening-Counseling & Management  Alcohol-Tobacco     Smoking Status: never  Problems Prior to Update: 1)  Cad, Native Vessel  (ICD-414.01) 2)  Fatigue / Malaise  (ICD-780.79) 3)  Chest Pain  (ICD-786.50) 4)  Dyspnea On Exertion/chronic  (ICD-786.09) 5)  Hypertension  (ICD-401.9) 6)  Transient Ischemic Attack, Hx of  (ICD-V12.50) 7)  Dysphagia Unspecified  (ICD-787.20) 8)  Internal Hemorrhoids  (ICD-455.0) 9)  Benign Prostatic Hypertrophy, With Urinary Obstruction  (ICD-600.01) 10)  Nausea  (ICD-787.02) 11)  Gastroparesis  (ICD-536.3) 12)  Back Pain, Chronic  (ICD-724.5) 13)  Chronic Kidney Disease Stage Iii (MODERATE)  (ICD-585.3) 14)  Posttraumatic Stress Disorder  (ICD-309.81) 15)  Diabetes Mellitus, Type II  (ICD-250.00) 16)  Hx of Rectal Bleeding  (ICD-569.3) 17)  Allergic Rhinitis  (ICD-477.9)  Current Problems (verified): 1)  Cad, Native Vessel  (ICD-414.01) 2)  Fatigue / Malaise  (ICD-780.79) 3)  Chest Pain  (ICD-786.50) 4)  Dyspnea On Exertion/chronic  (ICD-786.09) 5)  Hypertension  (ICD-401.9) 6)  Transient  Ischemic Attack, Hx of  (ICD-V12.50) 7)  Dysphagia Unspecified  (ICD-787.20) 8)  Internal Hemorrhoids  (ICD-455.0) 9)  Benign Prostatic Hypertrophy, With Urinary Obstruction  (ICD-600.01) 10)  Nausea  (ICD-787.02) 11)  Gastroparesis  (ICD-536.3) 12)  Back Pain, Chronic  (ICD-724.5) 13)  Chronic Kidney Disease Stage Iii (MODERATE)  (ICD-585.3) 14)  Posttraumatic Stress Disorder  (ICD-309.81) 15)  Diabetes Mellitus, Type II  (ICD-250.00) 16)  Hx of Rectal Bleeding  (ICD-569.3) 17)  Allergic Rhinitis  (ICD-477.9)  Medications Prior to Update: 1)  Adult Aspirin Low Strength 81 Mg  Tbdp (Aspirin) .... One By Mouth Daily 2)  Plavix 75 Mg  Tabs (Clopidogrel Bisulfate) .... One By Mouth Daily 3)  Crestor 40 Mg Tabs (Rosuvastatin Calcium) .... 1/2 Once Daily 4)  Hydralazine Hcl 50 Mg  Tabs (Hydralazine Hcl) .[name].. 1  By Mouth Bid 5)  Testosterone Cypionate 200 Mg/ml  Oil (Testosterone Cypionate) .... Every 2 Weeks 6)  Amlodipine Besylate 10 Mg  Tabs (Amlodipine Besylate) .... One By Mouth Daily 7)  Humalog Mix 50/50 Pen 50-50 % Susp (Insulin Lispro Prot & Lispro) .... 40 U Before Breakfast and Before Dinner 8)  Lantus 100 Unit/ml  Soln (Insulin Glargine) .... 90 Units At Bedtime 9)  Flomax 0.4 Mg  Cp24 (Tamsulosin Hcl) .... One By Mouth Daily 10)  Torsemide 100 Mg  Tabs (Torsemide) .... One By  Mouth in Am and 1/2 By Mouth in Pm 11)  Valium 5 Mg  Tabs (Diazepam) .... Once Daily As Needed 12)  Vesicare 10 Mg  Tabs (Solifenacin Succinate) .... Once Daily 13)  Calcitriol 0.25 Mcg  Caps (Calcitriol) .... One Tablet By Mouth Once Daily 14)  Klor-Con M20 20 Meq  Cr-Tabs (Potassium Chloride Crys Cr) .... One By Mouth Daily 15)  Bd U/f Short Pen Needle 31g X 8 Mm Misc (Insulin Pen Needle) .... To Use Qid With Insulin Pen 16)  Finasteride 5 Mg Tabs (Finasteride) .... One Tablet By Mouth Once Daily 17)  Isosorbide Mononitrate Cr 60 Mg Xr24h-Tab (Isosorbide Mononitrate) .... Take One & 1/2  Tablets By Mouth  Daily 18)  Metoprolol Tartrate 50 Mg Tabs (Metoprolol Tartrate) .... Take 150mg  Two Times A Day 19)  Vesicare 5 Mg Tabs (Solifenacin Succinate) .... Take 1 Tablet By Mouth Once A Day  Current Medications (verified): 1)  Adult Aspirin Low Strength 81 Mg  Tbdp (Aspirin) .... One By Mouth Daily 2)  Plavix 75 Mg  Tabs (Clopidogrel Bisulfate) .... One By Mouth Daily 3)  Crestor 40 Mg Tabs (Rosuvastatin Calcium) .... 1/2 Once Daily 4)  Hydralazine Hcl 50 Mg  Tabs (Hydralazine Hcl) .[name].. 1  By Mouth Bid 5)  Testosterone Cypionate 200 Mg/ml  Oil (Testosterone Cypionate) .... Every 2 Weeks 6)  Amlodipine Besylate 10 Mg  Tabs (Amlodipine Besylate) .... One By Mouth Daily 7)  Humalog Mix 50/50 Pen 50-50 % Susp (Insulin Lispro Prot & Lispro) .... 40 U Before Breakfast and Before Dinner 8)  Lantus 100 Unit/ml  Soln (Insulin Glargine) .... 90 Units At Bedtime 9)  Flomax 0.4 Mg  Cp24 (Tamsulosin Hcl) .... One By Mouth Daily 10)  Torsemide 100 Mg  Tabs (Torsemide) .... One By Mouth in Am and 1/2 By Mouth in Pm 11)  Valium 5 Mg  Tabs (Diazepam) .... Once Daily As Needed 12)  Vesicare 10 Mg  Tabs (Solifenacin Succinate) .... Once Daily 13)  Calcitriol 0.25 Mcg  Caps (Calcitriol) .... One Tablet By Mouth Once Daily 14)  Klor-Con M20 20 Meq  Cr-Tabs (Potassium Chloride Crys Cr) .... One By Mouth Daily 15)  Bd U/f Short Pen Needle 31g X 8 Mm Misc (Insulin Pen Needle) .... To Use Qid With Insulin Pen 16)  Finasteride 5 Mg Tabs (Finasteride) .... One Tablet By Mouth Once Daily 17)  Isosorbide Mononitrate Cr 60 Mg Xr24h-Tab (Isosorbide Mononitrate) .... Take One & 1/2  Tablets By Mouth Daily 18)  Metoprolol Tartrate 50 Mg Tabs (Metoprolol Tartrate) .... Take 150mg  Two Times A Day 19)  Vesicare 5 Mg Tabs (Solifenacin Succinate) .... Take 1 Tablet By Mouth Once A Day  Allergies (verified): No Known Drug Allergies  Past History:  Family History: Last updated: 02/04/2009 No FH of Colon Cancer: Family History of  Diabetes: 2 sisters, 4 aunts, 2 uncles Family History of Heart Disease:   Social History: Last updated: 01/04/2009 Never Smoked Alcohol use-no Retired Married quuit drinking caffeine May 4th.  Risk Factors: Exercise: yes (02/20/2008)  Risk Factors: Smoking Status: never (07/10/2009)  Past medical, surgical, family and social histories (including risk factors) reviewed, and no changes noted (except as noted below).  Past Medical History: Reviewed history from 04/09/2009 and no changes required. CAD, NATIVE VESSEL (ICD-414.01) FATIGUE / MALAISE (ICD-780.79) CHEST PAIN (ICD-786.50) DYSPNEA ON EXERTION/CHRONIC (ICD-786.09) HYPERTENSION (ICD-401.9) TRANSIENT ISCHEMIC ATTACK, HX OF (ICD-V12.50) DYSPHAGIA UNSPECIFIED (ICD-787.20) INTERNAL HEMORRHOIDS (ICD-455.0) BENIGN PROSTATIC HYPERTROPHY, WITH URINARY OBSTRUCTION (ICD-600.01) NAUSEA (  ICD-787.02) GASTROPARESIS (ICD-536.3) BACK PAIN, CHRONIC (ICD-724.5) CHRONIC KIDNEY DISEASE STAGE III (MODERATE) (ICD-585.3) POSTTRAUMATIC STRESS DISORDER (ICD-309.81) DIABETES MELLITUS, TYPE II (ICD-250.00) Hx of RECTAL BLEEDING (ICD-569.3) ALLERGIC RHINITIS (ICD-477.9)    Past Surgical History: Reviewed history from 01/04/2009 and no changes required. Hemorrhoidectomy fissure  Family History: Reviewed history from 02/04/2009 and no changes required. No FH of Colon Cancer: Family History of Diabetes: 2 sisters, 4 aunts, 2 uncles Family History of Heart Disease:   Social History: Reviewed history from 01/04/2009 and no changes required. Never Smoked Alcohol use-no Retired Married quuit drinking caffeine May 4th.  Review of Systems       The patient complains of dyspnea on exertion, headaches, and depression.  The patient denies anorexia, fever, weight loss, weight gain, vision loss, decreased hearing, hoarseness, chest pain, syncope, peripheral edema, prolonged cough, hemoptysis, abdominal pain, melena, hematochezia, severe  indigestion/heartburn, hematuria, incontinence, genital sores, muscle weakness, suspicious skin lesions, transient blindness, difficulty walking, unusual weight change, abnormal bleeding, enlarged lymph nodes, angioedema, breast masses, and testicular masses.         abdominal distention  Physical Exam  General:  obese.   Head:  normocephalic.   Eyes:  pupils equal and pupils round.   Ears:  R ear normal and L ear normal.   Nose:  no external deformity and no nasal discharge.   Mouth:  good dentition and pharynx pink and moist.   Neck:  Neck supple, no JVD. No masses, thyromegaly or abnormal cervical nodes. Lungs:  normal respiratory effort and no wheezes.   Heart:  normal rate and regular rhythm.   Abdomen:  soft, no rigidity, and distended.   Msk:  no joint tenderness and no joint swelling.   Extremities:  1+ left pedal edema and 1+ right pedal edema.   Neurologic:  alert & oriented X3 and gait normal.     Impression & Recommendations:  Problem # 1:  HYPERTENSION (ICD-401.9) Assessment Unchanged stable His updated medication list for this problem includes:    Hydralazine Hcl 50 Mg Tabs (Hydralazine hcl) .[name]... 1  by mouth bid    Amlodipine Besylate 10 Mg Tabs (Amlodipine besylate) ..... One by mouth daily    Torsemide 100 Mg Tabs (Torsemide) ..... One by mouth in am and 1/2 by mouth in pm    Metoprolol Tartrate 50 Mg Tabs (Metoprolol tartrate) .[name]... Take 150mg  two times a day  BP today: 124/70 Prior BP: 140/70 (04/22/2009)  Prior 10 Yr Risk Heart Disease: N/A (06/19/2008)  Labs Reviewed: K+: 4.0 (04/19/2009) Creat: : 2.61 (04/19/2009)   Chol: 116 (04/19/2009)   HDL: 36 (04/19/2009)   LDL: 56 (04/19/2009)   TG: 120 (04/19/2009)  Problem # 2:  CHRONIC KIDNEY DISEASE STAGE III (MODERATE) (ICD-585.3)  follow by Dr Eliott Nine stable creatinine weight stable  Labs Reviewed: BUN: 22 (04/19/2009)   Cr: 2.61 (04/19/2009)    Hgb: 14.3 (04/19/2009)   Hct: 43.8 (04/19/2009)   Ca++:  8.6 (04/19/2009)    TP: 6.2 (04/19/2009)   Alb: 3.7 (04/19/2009)  Problem # 3:  DIABETES MELLITUS, TYPE II (ICD-250.00)  His updated medication list for this problem includes:    Adult Aspirin Low Strength 81 Mg Tbdp (Aspirin) ..... One by mouth daily    Humalog Mix 50/50 Pen 50-50 % Susp (Insulin lispro prot & lispro) .Marland KitchenMarland KitchenMarland KitchenMarland Kitchen 40 u before breakfast and before dinner    Lantus 100 Unit/ml Soln (Insulin glargine) .[name]... 90 units at bedtime  Labs Reviewed: Creat: 2.61 (04/19/2009)  Last Eye Exam: normal (04/24/2009) Reviewed HgBA1c results: 7.8 (02/27/2009)  8.2 (09/18/2008)  Orders: Venipuncture (21308) TLB-BMP (Basic Metabolic Panel-BMET) (80048-METABOL) TLB-A1C / Hgb A1C (Glycohemoglobin) (83036-A1C)  Problem # 4:  SLEEP APNEA, OBSTRUCTIVE, MODERATE (ICD-327.23) has been on C pap for 4 years with no titration ( he has planned a visit with the [place]) we discussed changing to  local provider  Complete Medication List: 1)  Adult Aspirin Low Strength 81 Mg Tbdp (Aspirin) .... One by mouth daily 2)  Plavix 75 Mg Tabs (Clopidogrel bisulfate) .... One by mouth daily 3)  Crestor 40 Mg Tabs (Rosuvastatin calcium) .... 1/2 once daily 4)  Hydralazine Hcl 50 Mg Tabs (Hydralazine hcl) .[name].. 1  by mouth bid 5)  Testosterone Cypionate 200 Mg/ml Oil (Testosterone cypionate) .... Every 2 weeks 6)  Amlodipine Besylate 10 Mg Tabs (Amlodipine besylate) .... One by mouth daily 7)  Humalog Mix 50/50 Pen 50-50 % Susp (Insulin lispro prot & lispro) .... 40 u before breakfast and before dinner 8)  Lantus 100 Unit/ml Soln (Insulin glargine) .... 90 units at bedtime 9)  Flomax 0.4 Mg Cp24 (Tamsulosin hcl) .... One by mouth daily 10)  Torsemide 100 Mg Tabs (Torsemide) .... One by mouth in am and 1/2 by mouth in pm 11)  Valium 5 Mg Tabs (Diazepam) .... Once daily as needed 12)  Vesicare 10 Mg Tabs (Solifenacin succinate) .... Once daily 13)  Calcitriol 0.25 Mcg Caps (Calcitriol) .... One tablet by mouth once  daily 14)  Klor-con M20 20 Meq Cr-tabs (Potassium chloride crys cr) .... One by mouth daily 15)  Bd U/f Short Pen Needle 31g X 8 Mm Misc (Insulin pen needle) .... To use qid with insulin pen 16)  Finasteride 5 Mg Tabs (Finasteride) .... One tablet by mouth once daily 17)  Isosorbide Mononitrate Cr 60 Mg Xr24h-tab (Isosorbide mononitrate) .... Take one & 1/2  tablets by mouth daily 18)  Metoprolol Tartrate 50 Mg Tabs (Metoprolol tartrate) .... Take 150mg  two times a day 19)  Vesicare 5 Mg Tabs (Solifenacin succinate) .... Take 1 tablet by mouth once a day  Other Orders: TLB-Testosterone, Total (84403-TESTO)  Patient Instructions: 1)  Please schedule a follow-up appointment in 2 months. referral to teaching

## 2010-06-26 NOTE — Assessment & Plan Note (Signed)
Summary: follow up 6 months/mt  Medications Added VESICARE 5 MG TABS (SOLIFENACIN SUCCINATE) 1 tab once daily LOPRESSOR 50 MG TABS (METOPROLOL TARTRATE) 3 tabs two times a day DEPO-TESTOSTERONE 200 MG/ML OIL (TESTOSTERONE CYPIONATE) 1 injection q 2 wks      Allergies Added: NKDA  Visit Type:  6 mo f/u Referring Provider:  n/a Primary Provider:  Darryll Capers, MD  CC:  chest pain pt states he always has this says mostly when he goes up hill...sob.[name]..Marland Kitchenno edema.  History of Present Illness: Mr. Bones comes in today for followup of his coronary disease.  He is having no symptoms of angina or having to use much nitroglycerin. His blood pressures are best seen it. He has been walking quite a bit.  His creatinine is stable at 2.7 followed by nephrology. Blood work reviewed from today her office as well as Dr. Lovell Sheehan with patient.  Clinical Reports Reviewed:  Cardiac Cath:  11/23/2003: Cardiac Cath Findings:   CONCLUSIONS:  1. Nonobstructive coronary artery disease, but 40% narrowing of the diagonal     branch to the LAD, 70% narrowing in the ramus branches is circulatory,     and 90% stenosis in a small right ventricular branch of a nondominant     right coronary artery.  2. Marked left ventricular hypertrophy most probably related to hypertensive     cardiovascular disease with an elevated left ventricular pressure of 41.   RECOMMENDATIONS:  The patient has mainly nonobstructive coronary disease;  and, I think, this can be managed medically and it is not likely that he  __________ before symptoms.  He does have marked LVH and this may cause him  symptoms of exertional dyspnea.                                             Charlies Constable, M.D. [facility]  07/14/2001: Cardiac Cath Findings:  LEFT VENTRICULOGRAM: The left ventriculogram was obtained in the RAO projection. The EF was 65% with normal to hyperdynamic wall motion.  CONCLUSION: Single-vessel coronary artery disease in a  nondominant right coronary. This is not changed from his previous catheterization in November 2002.  PLAN: At this point, we should continue medical management. We could consider adding long-acting nitrate to his medical regimen. Dictated by:   Rollene Rotunda, M.D. [facility] Attending Physician:  Ron Parker DD:  07/14/01 TD:  07/15/01 Job: [id number]  04/01/2001: Cardiac Cath Findings:  IMPRESSIONS: 1. Significantly elevated left ventricular end-diastolic pressure secondary    to left ventricular hypertrophy. 2. Two-vessel coronary artery disease as described.  There is moderate but    nonobstructive disease in the obtuse marginal branch.  There is a diffuse    subtotal occlusion of the right ventricular branch of a nondominant    right coronary artery as described.  Because of the small size of this    vessel and diffuse nature of the disease, it is not amenable to    percutaneous intervention.  PLAN:  The patient will be managed with aggressive medical therapy.  He will be started on low-dose aspirin and long-term Plavix.  He will also be given aggressive anti-anginal therapy. Dictated by:   Daisey Must, M.D. [facility] Attending Physician:  Ron Parker DD:  04/01/01 TD:  04/03/01 Job: [id number]   Current Medications (verified): 1)  Adult Aspirin Low Strength 81 Mg  Tbdp (  Aspirin) .... One By Mouth Daily 2)  Plavix 75 Mg  Tabs (Clopidogrel Bisulfate) .... One By Mouth Daily 3)  Crestor 40 Mg Tabs (Rosuvastatin Calcium) .... 1/2 Once Daily 4)  Hydralazine Hcl 50 Mg  Tabs (Hydralazine Hcl) .[name].. 1  By Mouth Bid 5)  Amlodipine Besylate 10 Mg  Tabs (Amlodipine Besylate) .... One By Mouth Daily 6)  Humalog Mix 50/50 Pen 50-50 % Susp (Insulin Lispro Prot & Lispro) .... 40 U Before Lunch and Before Dinner ([employer] 7)  Lantus 100 Unit/ml  Soln (Insulin Glargine) .... 90 Units At Bedtime 8)  Flomax 0.4 Mg  Cp24 (Tamsulosin Hcl) .... One By Mouth  Daily 9)  Torsemide 100 Mg  Tabs (Torsemide) .... One By Mouth in Am and 1/2 By Mouth in Pm 10)  Valium 5 Mg  Tabs (Diazepam) .... Once Daily As Needed 11)  Vesicare 5 Mg Tabs (Solifenacin Succinate) .[name].. 1 Tab Once Daily 12)  Calcitriol 0.25 Mcg  Caps (Calcitriol) .... One Tablet By Mouth Once Daily 13)  Bd U/f Short Pen Needle 31g X 8 Mm Misc (Insulin Pen Needle) .... To Use Qid With Insulin Pen 14)  Finasteride 5 Mg Tabs (Finasteride) .... One Tablet By Mouth Once Daily 15)  Lopressor 50 Mg Tabs (Metoprolol Tartrate) .... 3 Tabs Two Times A Day 16)  Allopurinol 100 Mg Tabs (Allopurinol) .... One By Mouth A Bed Time 17)  Depo-Testosterone 200 Mg/ml Oil (Testosterone Cypionate) .[name].. 1 Injection Q 2 Wks  Allergies (verified): No Known Drug Allergies  Review of Systems       negative other than history of present illness  Vital Signs:  Patient profile:   60 year old male Height:      70 inches Weight:      291.8 pounds BMI:     42.02 Pulse rate:   65 / minute Pulse rhythm:   irregular BP sitting:   120 / 70  (right arm) Cuff size:   large  Vitals Entered By: Danielle Rankin, CMA (January 15, 2010 11:03 AM)  Physical Exam  General:  obese.   Head:  normocephalic and atraumatic Eyes:  wears glasses, baseline Neck:  Neck supple, no JVD. No masses, thyromegaly or abnormal cervical nodes. Lungs:  Clear bilaterally to auscultation and percussion. Heart:  PMI poorly appreciated, normal S1-S2, no gallop, carotids full without bruits Msk:  decreased ROM.   Pulses:  pulses normal in all 4 extremities Extremities:  trace left pedal edema and trace right pedal edema.   Neurologic:  Alert and oriented x 3. Skin:  Intact without lesions or rashes. Psych:  Normal affect.   EKG  Procedure date:  01/15/2010  Findings:      normal sinus rhythm with first-degree AV block, LVH with strain lateral changes. No acute changes.  Impression & Recommendations:  Problem # 1:  CAD, NATIVE VESSEL  (ICD-414.01) Assessment Unchanged  His updated medication list for this problem includes:    Adult Aspirin Low Strength 81 Mg Tbdp (Aspirin) ..... One by mouth daily    Plavix 75 Mg Tabs (Clopidogrel bisulfate) ..... One by mouth daily    Amlodipine Besylate 10 Mg Tabs (Amlodipine besylate) ..... One by mouth daily    Lopressor 50 Mg Tabs (Metoprolol tartrate) .Marland KitchenMarland KitchenMarland KitchenMarland Kitchen 3 tabs two times a day  Orders: EKG w/ Interpretation (93000)  Problem # 2:  CHEST PAIN (ICD-786.50) Assessment: Improved  His updated medication list for this problem includes:    Adult Aspirin Low  Strength 81 Mg Tbdp (Aspirin) ..... One by mouth daily    Plavix 75 Mg Tabs (Clopidogrel bisulfate) ..... One by mouth daily    Amlodipine Besylate 10 Mg Tabs (Amlodipine besylate) ..... One by mouth daily    Lopressor 50 Mg Tabs (Metoprolol tartrate) .Marland KitchenMarland KitchenMarland KitchenMarland Kitchen 3 tabs two times a day  Problem # 3:  HYPERTENSION (ICD-401.9) Assessment: Improved  His updated medication list for this problem includes:    Adult Aspirin Low Strength 81 Mg Tbdp (Aspirin) ..... One by mouth daily    Hydralazine Hcl 50 Mg Tabs (Hydralazine hcl) .[name]... 1  by mouth bid    Amlodipine Besylate 10 Mg Tabs (Amlodipine besylate) ..... One by mouth daily    Torsemide 100 Mg Tabs (Torsemide) ..... One by mouth in am and 1/2 by mouth in pm    Lopressor 50 Mg Tabs (Metoprolol tartrate) .Marland KitchenMarland KitchenMarland KitchenMarland Kitchen 3 tabs two times a day  Problem # 4:  CHRONIC KIDNEY DISEASE STAGE III (MODERATE) (ICD-585.3) Assessment: Unchanged  Problem # 5:  DIABETES MELLITUS, TYPE II (ICD-250.00) Assessment: Unchanged  His updated medication list for this problem includes:    Adult Aspirin Low Strength 81 Mg Tbdp (Aspirin) ..... One by mouth daily    Humalog Mix 50/50 Pen 50-50 % Susp (Insulin lispro prot & lispro) .Marland KitchenMarland KitchenMarland KitchenMarland Kitchen 40 u before lunch and before dinner (the two largest meals.)    Lantus 100 Unit/ml Soln (Insulin glargine) .[name]... 90 units at bedtime  Patient Instructions: 1)  Your physician  recommends that you schedule a follow-up appointment in: 6 months with Dr. Daleen Squibb 2)  Your physician recommends that you continue on your current medications as directed. Please refer to the Current Medication list given to you today.

## 2010-06-26 NOTE — Letter (Signed)
Summary: [facility] Office Note  [facility] Office Note   Imported By: Roderic Ovens 08/09/2009 14:19:49  _____________________________________________________________________  External Attachment:    Type:   Image     Comment:   External Document

## 2010-06-26 NOTE — Progress Notes (Signed)
Summary: SLEEP RESULT TEST  Phone Note Call from Patient Call back at Home Phone [phone]   Caller: Patient Call For: Stacie Glaze MD Summary of Call: PT NEEDS RESULTS OF SLEEP STUDY DONE AT [facility] ON 01-08-2010 ALSO PT IS REQ TO PICK UP A COPY OF REPORT TO TAKE TO [facility] Initial call taken by: Heron Sabins,  January 28, 2010 12:58 PM  Follow-up for Phone Call        debbie the sleep study is not in EMR check echart and see if it is complete if not call pulmonary to request results Follow-up by: Stacie Glaze MD,  January 29, 2010 9:04 AM  Additional Follow-up for Phone Call Additional follow up Details #1::        Results in [place], and on your desk, Dr. Lovell Sheehan. Copy made for pt. Additional Follow-up by: Lynann Beaver CMA,  January 29, 2010 10:30 AM

## 2010-06-26 NOTE — Progress Notes (Signed)
Summary: elevated BS  LMTCB 11/12/1009  Phone Note Call from Patient   Caller: Patient Call For: Stacie Glaze MD Reason for Call: Acute Illness Summary of Call: Pt states that his BSs are running high......Marland Kitchenhighest in 2 weeks :  425, today 368.......lowest 168.  Taking Lantus 90 units q hs, and 40 units of 50//50 Humalog before breakfast and before last meal at night. Complaining of nausea q am and not eating 3 meals.'[phone]  Ran out of Zofran.  Cannot eat anything before 11 am.  Weight holding steady.  Complaining of edema, and nephologist is working with him on that.  Initial call taken by: Lynann Beaver CMA,  November 12, 2009 10:45 AM  Follow-up for Phone Call        pts should change the humalog to the two meals that are larger ( lunch and dinner and not take it with breakfast since he gets up at 10 or 11 moniter the fingersticks before each injectons and record them caqll in these readings after 7 days to determin the adjustments insulin Follow-up by: Stacie Glaze MD,  November 12, 2009 1:38 PM  Additional Follow-up for Phone Call Additional follow up Details #1::        [facility] Additional Follow-up by: Lynann Beaver CMA,  November 12, 2009 1:59 PM    Additional Follow-up for Phone Call Additional follow up Details #2::    Pt given Dr. Lovell Sheehan instructions. Follow-up by: Lynann Beaver CMA,  November 12, 2009 3:49 PM  New/Updated Medications: HUMALOG MIX 50/50 PEN 50-50 % SUSP (INSULIN LISPRO PROT & LISPRO) 40 u before lunch and before dinner (the two largest meals.)

## 2010-06-26 NOTE — Assessment & Plan Note (Signed)
Summary: F/U ON SLEEP STUDY, DIABETIC CONCERNS // RS   Vital Signs:  Patient profile:   60 year old male Height:      70 inches Weight:      276 pounds BMI:     39.75 Temp:     98.2 degrees F oral Pulse rate:   72 / minute Resp:     16 per minute BP sitting:   140 / 70 Cuff size:   large  Vitals Entered By: Willy Eddy, LPN (May 01, 2010 10:53 AM) CC: roa- pt still only takies h umalog twice a day- was told to take before breakfast lunch, and supper but pt states he doesnt eat breakfast until 1pm because of nausea and then just eats supper--fasting bs this am was276, Hypertension Management Is Patient Diabetic? Yes Did you bring your meter with you today? No   Primary Care Provider:  Darryll Capers, MD  CC:  roa- pt still only takies h umalog twice a day- was told to take before breakfast lunch, and supper but pt states he doesnt eat breakfast until 1pm because of nausea and then just eats supper--fasting bs this am was276, and Hypertension Management.  History of Present Illness: the pt took the sleep study to the [place] and has a new mask but this has not improved the quality of his sleep. the working diagnosis is that pain interfers with sleep andhe hsa been using the valium every night. The pt's blood sugar was 564 at the nephrologist. he did not take the insulin ( he skipped dinner) The  process of skipping meals is contributing to his fatigue! The barrier to eating is the nausea and GERD finger stick this AM was 276  Hypertension History:      He denies headache, chest pain, palpitations, dyspnea with exertion, orthopnea, PND, peripheral edema, visual symptoms, neurologic problems, syncope, and side effects from treatment.        Positive major cardiovascular risk factors include male age 38 years old or older, diabetes, and hypertension.  Negative major cardiovascular risk factors include non-tobacco-user status.        Positive history for target organ damage include  ASHD (either angina/prior MI/prior CABG) and renal insufficiency.     Preventive Screening-Counseling & Management  Alcohol-Tobacco     Smoking Status: never  Problems Prior to Update: 1)  Gout, Unspecified  (ICD-274.9) 2)  Testicular Hypofunction  (ICD-257.2) 3)  Sleep Apnea, Obstructive, Moderate  (ICD-327.23) 4)  Cad, Native Vessel  (ICD-414.01) 5)  Fatigue / Malaise  (ICD-780.79) 6)  Chest Pain  (ICD-786.50) 7)  Dyspnea On Exertion/chronic  (ICD-786.09) 8)  Hypertension  (ICD-401.9) 9)  Transient Ischemic Attack, Hx of  (ICD-V12.50) 10)  Dysphagia Unspecified  (ICD-787.20) 11)  Internal Hemorrhoids  (ICD-455.0) 12)  Benign Prostatic Hypertrophy, With Urinary Obstruction  (ICD-600.01) 13)  Nausea  (ICD-787.02) 14)  Gastroparesis  (ICD-536.3) 15)  Back Pain, Chronic  (ICD-724.5) 16)  Chronic Kidney Disease Stage Iii (MODERATE)  (ICD-585.3) 17)  Posttraumatic Stress Disorder  (ICD-309.81) 18)  Diabetes Mellitus, Type II  (ICD-250.00) 19)  Hx of Rectal Bleeding  (ICD-569.3) 20)  Allergic Rhinitis  (ICD-477.9)  Current Problems (verified): 1)  Gout, Unspecified  (ICD-274.9) 2)  Testicular Hypofunction  (ICD-257.2) 3)  Sleep Apnea, Obstructive, Moderate  (ICD-327.23) 4)  Cad, Native Vessel  (ICD-414.01) 5)  Fatigue / Malaise  (ICD-780.79) 6)  Chest Pain  (ICD-786.50) 7)  Dyspnea On Exertion/chronic  (ICD-786.09) 8)  Hypertension  (ICD-401.9) 9)  Transient Ischemic Attack, Hx of  (ICD-V12.50) 10)  Dysphagia Unspecified  (ICD-787.20) 11)  Internal Hemorrhoids  (ICD-455.0) 12)  Benign Prostatic Hypertrophy, With Urinary Obstruction  (ICD-600.01) 13)  Nausea  (ICD-787.02) 14)  Gastroparesis  (ICD-536.3) 15)  Back Pain, Chronic  (ICD-724.5) 16)  Chronic Kidney Disease Stage Iii (MODERATE)  (ICD-585.3) 17)  Posttraumatic Stress Disorder  (ICD-309.81) 18)  Diabetes Mellitus, Type II  (ICD-250.00) 19)  Hx of Rectal Bleeding  (ICD-569.3) 20)  Allergic Rhinitis   (ICD-477.9)  Medications Prior to Update: 1)  Adult Aspirin Low Strength 81 Mg  Tbdp (Aspirin) .... One By Mouth Daily 2)  Plavix 75 Mg  Tabs (Clopidogrel Bisulfate) .... One By Mouth Daily 3)  Crestor 40 Mg Tabs (Rosuvastatin Calcium) .... 1/2 Once Daily 4)  Hydralazine Hcl 50 Mg  Tabs (Hydralazine Hcl) .[name].. 1  By Mouth Bid 5)  Amlodipine Besylate 10 Mg  Tabs (Amlodipine Besylate) .... One By Mouth Daily 6)  Humalog Mix 50/50 Pen 50-50 % Susp (Insulin Lispro Prot & Lispro) .... 40 U Before Lunch and Before Dinner ([employer] 7)  Lantus 100 Unit/ml  Soln (Insulin Glargine) .... 90 Units At Bedtime 8)  Flomax 0.4 Mg  Cp24 (Tamsulosin Hcl) .... One By Mouth Daily 9)  Torsemide 100 Mg  Tabs (Torsemide) .... One By Mouth in Am and 1/2 By Mouth in Pm 10)  Valium 5 Mg  Tabs (Diazepam) .... Once Daily As Needed 11)  Vesicare 5 Mg Tabs (Solifenacin Succinate) .[name].. 1 Tab Once Daily 12)  Calcitriol 0.25 Mcg  Caps (Calcitriol) .... One Tablet By Mouth Once Daily 13)  Bd U/f Short Pen Needle 31g X 8 Mm Misc (Insulin Pen Needle) .... To Use Qid With Insulin Pen 14)  Finasteride 5 Mg Tabs (Finasteride) .... One Tablet By Mouth Once Daily 15)  Lopressor 50 Mg Tabs (Metoprolol Tartrate) .... 3 Tabs Two Times A Day 16)  Allopurinol 100 Mg Tabs (Allopurinol) .... One By Mouth A Bed Time 17)  Depo-Testosterone 200 Mg/ml Oil (Testosterone Cypionate) .[name].. 1 Injection Q 2 Wks  Current Medications (verified): 1)  Adult Aspirin Low Strength 81 Mg  Tbdp (Aspirin) .... One By Mouth Daily 2)  Plavix 75 Mg  Tabs (Clopidogrel Bisulfate) .... One By Mouth Daily 3)  Crestor 40 Mg Tabs (Rosuvastatin Calcium) .... 1/2 Once Daily 4)  Hydralazine Hcl 50 Mg  Tabs (Hydralazine Hcl) .[name].. 1  By Mouth Bid 5)  Amlodipine Besylate 10 Mg  Tabs (Amlodipine Besylate) .... One By Mouth Daily 6)  Humalog Mix 50/50 Pen 50-50 % Susp (Insulin Lispro Prot & Lispro) .... 40 U Before Lunch and Before Dinner ([employer] 7)  Lantus 100 Unit/ml  Soln (Insulin Glargine) .... 90 Units At Bedtime 8)  Flomax 0.4 Mg  Cp24 (Tamsulosin Hcl) .... One By Mouth Daily 9)  Torsemide 100 Mg  Tabs (Torsemide) .... One By Mouth in Am and 1/2 By Mouth in Pm 10)  Valium 5 Mg  Tabs (Diazepam) .... Once Daily As Needed 11)  Vesicare 5 Mg Tabs (Solifenacin Succinate) .[name].. 1 Tab Once Daily 12)  Calcitriol 0.25 Mcg  Caps (Calcitriol) .... One Tablet By Mouth Once Daily 13)  Bd U/f Short Pen Needle 31g X 8 Mm Misc (Insulin Pen Needle) .... To Use Qid With Insulin Pen 14)  Finasteride 5 Mg Tabs (Finasteride) .... One Tablet By Mouth Once Daily 15)  Lopressor 50 Mg Tabs (Metoprolol Tartrate) .... 3 Tabs Two Times  A Day 16)  Allopurinol 100 Mg Tabs (Allopurinol) .... One By Mouth A Bed Time 17)  Depo-Testosterone 200 Mg/ml Oil (Testosterone Cypionate) .[name].. 1 Injection Q 2 Wks 18)  Metoclopramide Hcl 5 Mg Tabs (Metoclopramide Hcl) .... One By Mouth Three Times A Day 30 Min Before Meals  Allergies (verified): No Known Drug Allergies  Past History:  Family History: Last updated: 02/04/2009 No FH of Colon Cancer: Family History of Diabetes: 2 sisters, 4 aunts, 2 uncles Family History of Heart Disease:   Social History: Last updated: 01/04/2009 Never Smoked Alcohol use-no Retired Married quuit drinking caffeine May 4th.  Risk Factors: Exercise: yes (02/20/2008)  Risk Factors: Smoking Status: never (05/01/2010)  Past medical, surgical, family and social histories (including risk factors) reviewed, and no changes noted (except as noted below).  Past Medical History: Reviewed history from 04/09/2009 and no changes required. CAD, NATIVE VESSEL (ICD-414.01) FATIGUE / MALAISE (ICD-780.79) CHEST PAIN (ICD-786.50) DYSPNEA ON EXERTION/CHRONIC (ICD-786.09) HYPERTENSION (ICD-401.9) TRANSIENT ISCHEMIC ATTACK, HX OF (ICD-V12.50) DYSPHAGIA UNSPECIFIED (ICD-787.20) INTERNAL HEMORRHOIDS (ICD-455.0) BENIGN PROSTATIC  HYPERTROPHY, WITH URINARY OBSTRUCTION (ICD-600.01) NAUSEA (ICD-787.02) GASTROPARESIS (ICD-536.3) BACK PAIN, CHRONIC (ICD-724.5) CHRONIC KIDNEY DISEASE STAGE III (MODERATE) (ICD-585.3) POSTTRAUMATIC STRESS DISORDER (ICD-309.81) DIABETES MELLITUS, TYPE II (ICD-250.00) Hx of RECTAL BLEEDING (ICD-569.3) ALLERGIC RHINITIS (ICD-477.9)    Past Surgical History: Reviewed history from 01/04/2009 and no changes required. Hemorrhoidectomy fissure  Family History: Reviewed history from 02/04/2009 and no changes required. No FH of Colon Cancer: Family History of Diabetes: 2 sisters, 4 aunts, 2 uncles Family History of Heart Disease:   Social History: Reviewed history from 01/04/2009 and no changes required. Never Smoked Alcohol use-no Retired Married quuit drinking caffeine May 4th.  Review of Systems       The patient complains of abdominal pain and severe indigestion/heartburn.  The patient denies anorexia, fever, weight loss, weight gain, vision loss, decreased hearing, hoarseness, chest pain, syncope, dyspnea on exertion, peripheral edema, prolonged cough, headaches, hemoptysis, melena, hematochezia, hematuria, incontinence, genital sores, muscle weakness, suspicious skin lesions, transient blindness, difficulty walking, depression, unusual weight change, abnormal bleeding, enlarged lymph nodes, angioedema, breast masses, and testicular masses.    Physical Exam  General:  obese.   Head:  normocephalic.   Eyes:  pupils equal and pupils round.   Ears:  R ear normal and L ear normal.   Mouth:  good dentition and pharynx pink and moist.   Neck:  Neck supple, no JVD. No masses, thyromegaly or abnormal cervical nodes. Lungs:  normal respiratory effort and no wheezes.   Heart:  normal rate and regular rhythm.   Abdomen:  soft, no rigidity, and distended.   Msk:  no joint tenderness and no joint swelling.   Extremities:  1+ left pedal edema and 1+ right pedal edema.   Neurologic:  alert  & oriented X3 and gait normal.     Impression & Recommendations:  Problem # 1:  GASTROPARESIS (ICD-536.3) without the reglan the nausea and reflux has caused him to skip meals and skip insulin dosing with high record CBGs as high as 500. He need to eat 3 squares.... and we have added the reglan back at 5 mg three times a day and discussed potential side eefects  Problem # 2:  FATIGUE / MALAISE (ICD-780.79) due to meals and lack of exercize  Problem # 3:  CHRONIC KIDNEY DISEASE STAGE III (MODERATE) (ICD-585.3)  Labs Reviewed: BUN: 25 (12/10/2009)   Cr: 2.9 (12/10/2009)    Hgb: 14.3 (04/19/2009)   Hct: 43.8 (  04/19/2009)   Ca++: 8.7 (12/10/2009)    TP: 6.2 (04/19/2009)   Alb: 3.7 (04/19/2009)  Problem # 4:  DIABETES MELLITUS, TYPE II (ICD-250.00)  need to eat three meals a da and get the insulin His updated medication list for this problem includes:    Adult Aspirin Low Strength 81 Mg Tbdp (Aspirin) ..... One by mouth daily    Humalog Mix 50/50 Pen 50-50 % Susp (Insulin lispro prot & lispro) .Marland KitchenMarland KitchenMarland KitchenMarland Kitchen 40 u before lunch and before dinner (the two largest meals.)    Lantus 100 Unit/ml Soln (Insulin glargine) .[name]... 90 units at bedtime  Labs Reviewed: Creat: 2.9 (12/10/2009)     Last Eye Exam: normal (04/24/2009) Reviewed HgBA1c results: 7.9 (07/10/2009)  7.8 (02/27/2009)  Orders: Venipuncture (74081) TLB-A1C / Hgb A1C (Glycohemoglobin) (83036-A1C)  Complete Medication List: 1)  Adult Aspirin Low Strength 81 Mg Tbdp (Aspirin) .... One by mouth daily 2)  Plavix 75 Mg Tabs (Clopidogrel bisulfate) .... One by mouth daily 3)  Crestor 40 Mg Tabs (Rosuvastatin calcium) .... 1/2 once daily 4)  Hydralazine Hcl 50 Mg Tabs (Hydralazine hcl) .[name].. 1  by mouth bid 5)  Amlodipine Besylate 10 Mg Tabs (Amlodipine besylate) .... One by mouth daily 6)  Humalog Mix 50/50 Pen 50-50 % Susp (Insulin lispro prot & lispro) .... 40 u before lunch and before dinner (the two largest meals.) 7)  Lantus 100  Unit/ml Soln (Insulin glargine) .... 90 units at bedtime 8)  Flomax 0.4 Mg Cp24 (Tamsulosin hcl) .... One by mouth daily 9)  Torsemide 100 Mg Tabs (Torsemide) .... One by mouth in am and 1/2 by mouth in pm 10)  Valium 5 Mg Tabs (Diazepam) .... Once daily as needed 11)  Vesicare 5 Mg Tabs (Solifenacin succinate) .[name].. 1 tab once daily 12)  Calcitriol 0.25 Mcg Caps (Calcitriol) .... One tablet by mouth once daily 13)  Bd U/f Short Pen Needle 31g X 8 Mm Misc (Insulin pen needle) .... To use qid with insulin pen 14)  Finasteride 5 Mg Tabs (Finasteride) .... One tablet by mouth once daily 15)  Lopressor 50 Mg Tabs (Metoprolol tartrate) .... 3 tabs two times a day 16)  Allopurinol 100 Mg Tabs (Allopurinol) .... One by mouth a bed time 17)  Depo-testosterone 200 Mg/ml Oil (Testosterone cypionate) .[name].. 1 injection q 2 wks 18)  Metoclopramide Hcl 5 Mg Tabs (Metoclopramide hcl) .... One by mouth three times a day 30 min before meals  Hypertension Assessment/Plan:      The patient's hypertensive risk group is category C: Target organ damage and/or diabetes.  Today's blood pressure is 140/70.  His blood pressure goal is < 130/80.  Patient Instructions: 1)  Please schedule a follow-up appointment in 3 months. Prescriptions: METOCLOPRAMIDE HCL 5 MG TABS (METOCLOPRAMIDE HCL) one by mouth three times a day 30 min before meals  #90 x 6   Entered and Authorized by:   Stacie Glaze MD   Signed by:   Stacie Glaze MD on 05/01/2010   Method used:   Electronically to        [id number] (retail)       [address]       [place], [place]  [address]       Ph: [phone] or [phone]       Fax: [phone]   RxID:   [id number]    Orders Added: 1)  Est. Patient Level IV [47654] 2)  Venipuncture [65035] 3)  TLB-A1C / Hgb  A1C (Glycohemoglobin) [83036-A1C]  Appended Document: Orders Update     Clinical Lists Changes  Orders: Added new Service order of Specimen Handling (04540) -  Signed

## 2010-06-26 NOTE — Letter (Signed)
Summary: [facility] Patient Note   [facility] Patient Note   Imported By: Roderic Ovens 05/13/2010 12:15:38  _____________________________________________________________________  External Attachment:    Type:   Image     Comment:   External Document

## 2010-06-26 NOTE — Progress Notes (Signed)
Summary: REQUEST FOR RESULTS  Phone Note Call from Patient   Caller: Patient  [phone] Reason for Call: Acute Illness Summary of Call: Pt called to advise that he hasn't received the results of his sleep study - advised that he needs same so that he can send a copy to [place]...Marland KitchenMarland Kitchen Pt would like a return call at  [phone].  Initial call taken by: Debbra Riding,  March 20, 2010 2:47 PM  Follow-up for Phone Call        PT INFORMED- COPY UP FRONT READY FOR PICK UP Follow-up by: Willy Eddy, LPN,  March 20, 2010 3:08 PM

## 2010-06-26 NOTE — Letter (Signed)
Summary: [facility]  [facility]   Imported By: Maryln Gottron 08/01/2009 15:32:19  _____________________________________________________________________  External Attachment:    Type:   Image     Comment:   External Document

## 2010-06-26 NOTE — Letter (Signed)
Summary: [facility]  [facility]   Imported By: Maryln Gottron 02/17/2010 12:14:03  _____________________________________________________________________  External Attachment:    Type:   Image     Comment:   External Document

## 2010-06-26 NOTE — Assessment & Plan Note (Signed)
Summary: FOLLOW UP/CJR   Vital Signs:  Patient profile:   60 year old male Height:      70 inches Weight:      288 pounds BMI:     41.47 Temp:     98.2 degrees F oral Pulse rate:   80 / minute Pulse rhythm:   irregular Resp:     16 per minute BP sitting:   144 / 66  (left arm) Cuff size:   large  Vitals Entered By: Willy Eddy, LPN (December 10, 2009 4:34 PM) CC: roa-c/o elevated blood sugar due to being prednisone for gout, Hypertension Management Is Patient Diabetic? Yes Did you bring your meter with you today? No   Primary Care Provider:  Darryll Capers, MD  CC:  roa-c/o elevated blood sugar due to being prednisone for gout and Hypertension Management.  History of Present Illness: has cut out red meaqt and has not had any gout has not been exercizing due to increased pain knees feet and back ( relationship between gout and OA?) having trouble at night tolerating the c pap when sleeps on his side hip and should bursidis pain are a problem the CBG's are better   Hypertension History:      He denies headache, chest pain, palpitations, dyspnea with exertion, orthopnea, PND, peripheral edema, visual symptoms, neurologic problems, syncope, and side effects from treatment.        Positive major cardiovascular risk factors include male age 69 years old or older, diabetes, and hypertension.  Negative major cardiovascular risk factors include non-tobacco-user status.        Positive history for target organ damage include ASHD (either angina/prior MI/prior CABG) and renal insufficiency.     Preventive Screening-Counseling & Management  Alcohol-Tobacco     Smoking Status: never  Problems Prior to Update: 1)  Testicular Hypofunction  (ICD-257.2) 2)  Sleep Apnea, Obstructive, Moderate  (ICD-327.23) 3)  Cad, Native Vessel  (ICD-414.01) 4)  Fatigue / Malaise  (ICD-780.79) 5)  Chest Pain  (ICD-786.50) 6)  Dyspnea On Exertion/chronic  (ICD-786.09) 7)  Hypertension   (ICD-401.9) 8)  Transient Ischemic Attack, Hx of  (ICD-V12.50) 9)  Dysphagia Unspecified  (ICD-787.20) 10)  Internal Hemorrhoids  (ICD-455.0) 11)  Benign Prostatic Hypertrophy, With Urinary Obstruction  (ICD-600.01) 12)  Nausea  (ICD-787.02) 13)  Gastroparesis  (ICD-536.3) 14)  Back Pain, Chronic  (ICD-724.5) 15)  Chronic Kidney Disease Stage Iii (MODERATE)  (ICD-585.3) 16)  Posttraumatic Stress Disorder  (ICD-309.81) 17)  Diabetes Mellitus, Type II  (ICD-250.00) 18)  Hx of Rectal Bleeding  (ICD-569.3) 19)  Allergic Rhinitis  (ICD-477.9)  Current Problems (verified): 1)  Testicular Hypofunction  (ICD-257.2) 2)  Sleep Apnea, Obstructive, Moderate  (ICD-327.23) 3)  Cad, Native Vessel  (ICD-414.01) 4)  Fatigue / Malaise  (ICD-780.79) 5)  Chest Pain  (ICD-786.50) 6)  Dyspnea On Exertion/chronic  (ICD-786.09) 7)  Hypertension  (ICD-401.9) 8)  Transient Ischemic Attack, Hx of  (ICD-V12.50) 9)  Dysphagia Unspecified  (ICD-787.20) 10)  Internal Hemorrhoids  (ICD-455.0) 11)  Benign Prostatic Hypertrophy, With Urinary Obstruction  (ICD-600.01) 12)  Nausea  (ICD-787.02) 13)  Gastroparesis  (ICD-536.3) 14)  Back Pain, Chronic  (ICD-724.5) 15)  Chronic Kidney Disease Stage Iii (MODERATE)  (ICD-585.3) 16)  Posttraumatic Stress Disorder  (ICD-309.81) 17)  Diabetes Mellitus, Type II  (ICD-250.00) 18)  Hx of Rectal Bleeding  (ICD-569.3) 19)  Allergic Rhinitis  (ICD-477.9)  Medications Prior to Update: 1)  Adult Aspirin Low Strength 81 Mg  Tbdp (Aspirin) .Marland KitchenMarland KitchenMarland Kitchen  One By Mouth Daily 2)  Plavix 75 Mg  Tabs (Clopidogrel Bisulfate) .... One By Mouth Daily 3)  Crestor 40 Mg Tabs (Rosuvastatin Calcium) .... 1/2 Once Daily 4)  Hydralazine Hcl 50 Mg  Tabs (Hydralazine Hcl) .[name].. 1  By Mouth Bid 5)  Testosterone Cypionate 200 Mg/ml  Oil (Testosterone Cypionate) .... Every 2 Weeks 6)  Amlodipine Besylate 10 Mg  Tabs (Amlodipine Besylate) .... One By Mouth Daily 7)  Humalog Mix 50/50 Pen 50-50 % Susp  (Insulin Lispro Prot & Lispro) .... 40 U Before Lunch and Before Dinner ([employer] 8)  Lantus 100 Unit/ml  Soln (Insulin Glargine) .... 90 Units At Bedtime 9)  Flomax 0.4 Mg  Cp24 (Tamsulosin Hcl) .... One By Mouth Daily 10)  Torsemide 100 Mg  Tabs (Torsemide) .... One By Mouth in Am and 1/2 By Mouth in Pm 11)  Valium 5 Mg  Tabs (Diazepam) .... Once Daily As Needed 12)  Vesicare 10 Mg  Tabs (Solifenacin Succinate) .... Once Daily 13)  Calcitriol 0.25 Mcg  Caps (Calcitriol) .... One Tablet By Mouth Once Daily 14)  Klor-Con M20 20 Meq  Cr-Tabs (Potassium Chloride Crys Cr) .... One By Mouth Daily 15)  Bd U/f Short Pen Needle 31g X 8 Mm Misc (Insulin Pen Needle) .... To Use Qid With Insulin Pen 16)  Finasteride 5 Mg Tabs (Finasteride) .... One Tablet By Mouth Once Daily 17)  Isosorbide Mononitrate Cr 60 Mg Xr24h-Tab (Isosorbide Mononitrate) .... Take One & 1/2  Tablets By Mouth Daily 18)  Metoprolol Tartrate 50 Mg Tabs (Metoprolol Tartrate) .... Take 1 Two Times A Day 19)  Vesicare 5 Mg Tabs (Solifenacin Succinate) .... Take 1 Tablet By Mouth Once A Day  Current Medications (verified): 1)  Adult Aspirin Low Strength 81 Mg  Tbdp (Aspirin) .... One By Mouth Daily 2)  Plavix 75 Mg  Tabs (Clopidogrel Bisulfate) .... One By Mouth Daily 3)  Crestor 40 Mg Tabs (Rosuvastatin Calcium) .... 1/2 Once Daily 4)  Hydralazine Hcl 50 Mg  Tabs (Hydralazine Hcl) .[name].. 1  By Mouth Bid 5)  Amlodipine Besylate 10 Mg  Tabs (Amlodipine Besylate) .... One By Mouth Daily 6)  Humalog Mix 50/50 Pen 50-50 % Susp (Insulin Lispro Prot & Lispro) .... 40 U Before Lunch and Before Dinner ([employer] 7)  Lantus 100 Unit/ml  Soln (Insulin Glargine) .... 90 Units At Bedtime 8)  Flomax 0.4 Mg  Cp24 (Tamsulosin Hcl) .... One By Mouth Daily 9)  Torsemide 100 Mg  Tabs (Torsemide) .... One By Mouth in Am and 1/2 By Mouth in Pm 10)  Valium 5 Mg  Tabs (Diazepam) .... Once Daily As Needed 11)  Vesicare 10 Mg   Tabs (Solifenacin Succinate) .... Once Daily 12)  Calcitriol 0.25 Mcg  Caps (Calcitriol) .... One Tablet By Mouth Once Daily 13)  Bd U/f Short Pen Needle 31g X 8 Mm Misc (Insulin Pen Needle) .... To Use Qid With Insulin Pen 14)  Finasteride 5 Mg Tabs (Finasteride) .... One Tablet By Mouth Once Daily 15)  Metoprolol Tartrate 50 Mg Tabs (Metoprolol Tartrate) .... Take 1 Two Times A Day 16)  Allopurinol 100 Mg Tabs (Allopurinol) .... One By Mouth A Bed Time  Allergies (verified): No Known Drug Allergies  Past History:  Family History: Last updated: 02/04/2009 No FH of Colon Cancer: Family History of Diabetes: 2 sisters, 4 aunts, 2 uncles Family History of Heart Disease:   Social History: Last updated: 01/04/2009 Never Smoked Alcohol  use-no Retired Married quuit drinking caffeine May 4th.  Risk Factors: Exercise: yes (02/20/2008)  Risk Factors: Smoking Status: never (12/10/2009)  Past medical, surgical, family and social histories (including risk factors) reviewed, and no changes noted (except as noted below).  Past Medical History: Reviewed history from 04/09/2009 and no changes required. CAD, NATIVE VESSEL (ICD-414.01) FATIGUE / MALAISE (ICD-780.79) CHEST PAIN (ICD-786.50) DYSPNEA ON EXERTION/CHRONIC (ICD-786.09) HYPERTENSION (ICD-401.9) TRANSIENT ISCHEMIC ATTACK, HX OF (ICD-V12.50) DYSPHAGIA UNSPECIFIED (ICD-787.20) INTERNAL HEMORRHOIDS (ICD-455.0) BENIGN PROSTATIC HYPERTROPHY, WITH URINARY OBSTRUCTION (ICD-600.01) NAUSEA (ICD-787.02) GASTROPARESIS (ICD-536.3) BACK PAIN, CHRONIC (ICD-724.5) CHRONIC KIDNEY DISEASE STAGE III (MODERATE) (ICD-585.3) POSTTRAUMATIC STRESS DISORDER (ICD-309.81) DIABETES MELLITUS, TYPE II (ICD-250.00) Hx of RECTAL BLEEDING (ICD-569.3) ALLERGIC RHINITIS (ICD-477.9)    Past Surgical History: Reviewed history from 01/04/2009 and no changes required. Hemorrhoidectomy fissure  Family History: Reviewed history from 02/04/2009 and no  changes required. No FH of Colon Cancer: Family History of Diabetes: 2 sisters, 4 aunts, 2 uncles Family History of Heart Disease:   Social History: Reviewed history from 01/04/2009 and no changes required. Never Smoked Alcohol use-no Retired Married quuit drinking caffeine May 4th.  Review of Systems  The patient denies anorexia, fever, weight loss, weight gain, vision loss, decreased hearing, hoarseness, chest pain, syncope, dyspnea on exertion, peripheral edema, prolonged cough, headaches, hemoptysis, abdominal pain, melena, hematochezia, severe indigestion/heartburn, hematuria, incontinence, genital sores, muscle weakness, suspicious skin lesions, transient blindness, difficulty walking, depression, unusual weight change, abnormal bleeding, enlarged lymph nodes, angioedema, and breast masses.    Physical Exam  General:  obese.   Head:  normocephalic.   Eyes:  pupils equal and pupils round.   Ears:  R ear normal and L ear normal.   Nose:  no external deformity and no nasal discharge.   Mouth:  good dentition and pharynx pink and moist.   Neck:  Neck supple, no JVD. No masses, thyromegaly or abnormal cervical nodes. Lungs:  normal respiratory effort and no wheezes.   Heart:  normal rate and regular rhythm.   Abdomen:  soft, no rigidity, and distended.     Impression & Recommendations:  Problem # 1:  SLEEP APNEA, OBSTRUCTIVE, MODERATE (ICD-327.23) Assessment Deteriorated this may be compicatioing his CHF Orders: Sleep Disorder Referral (Sleep Disorder)  Problem # 2:  HYPERTENSION (ICD-401.9) Assessment: Unchanged weight and exercize are keys His updated medication list for this problem includes:    Hydralazine Hcl 50 Mg Tabs (Hydralazine hcl) .[name]... 1  by mouth bid    Amlodipine Besylate 10 Mg Tabs (Amlodipine besylate) ..... One by mouth daily    Torsemide 100 Mg Tabs (Torsemide) ..... One by mouth in am and 1/2 by mouth in pm    Metoprolol Tartrate 50 Mg Tabs  (Metoprolol tartrate) .[name]... Take 1 two times a day  BP today: 144/66 Prior BP: 124/70 (07/10/2009)  Prior 10 Yr Risk Heart Disease: N/A (06/19/2008)  Labs Reviewed: K+: 4.0 (07/10/2009) Creat: : 3.2 (07/10/2009)   Chol: 116 (04/19/2009)   HDL: 36 (04/19/2009)   LDL: 56 (04/19/2009)   TG: 120 (04/19/2009)  Problem # 3:  BACK PAIN, CHRONIC (ICD-724.5)  His updated medication list for this problem includes:    Adult Aspirin Low Strength 81 Mg Tbdp (Aspirin) ..... One by mouth daily  Discussed use of moist heat or ice, modified activities, medications, and stretching/strengthening exercises. Back care instructions given. To be seen in 2 weeks if no improvement; sooner if worsening of symptoms.   Problem # 4:  CHRONIC KIDNEY DISEASE STAGE III (MODERATE) (ICD-585.3)  Labs Reviewed: BUN: 30 (07/10/2009)   Cr: 3.2 (07/10/2009)    Hgb: 14.3 (04/19/2009)   Hct: 43.8 (04/19/2009)   Ca++: 8.9 (07/10/2009)    TP: 6.2 (04/19/2009)   Alb: 3.7 (04/19/2009)  Problem # 5:  GOUT, UNSPECIFIED (ICD-274.9) trial to see if low grade gout may be part of his OA pain His updated medication list for this problem includes:    Allopurinol 100 Mg Tabs (Allopurinol) ..... One by mouth a bed time  Elevate extremity; warm compresses, symptomatic relief and medication as directed.   Complete Medication List: 1)  Adult Aspirin Low Strength 81 Mg Tbdp (Aspirin) .... One by mouth daily 2)  Plavix 75 Mg Tabs (Clopidogrel bisulfate) .... One by mouth daily 3)  Crestor 40 Mg Tabs (Rosuvastatin calcium) .... 1/2 once daily 4)  Hydralazine Hcl 50 Mg Tabs (Hydralazine hcl) .[name].. 1  by mouth bid 5)  Amlodipine Besylate 10 Mg Tabs (Amlodipine besylate) .... One by mouth daily 6)  Humalog Mix 50/50 Pen 50-50 % Susp (Insulin lispro prot & lispro) .... 40 u before lunch and before dinner (the two largest meals.) 7)  Lantus 100 Unit/ml Soln (Insulin glargine) .... 90 units at bedtime 8)  Flomax 0.4 Mg Cp24 (Tamsulosin hcl)  .... One by mouth daily 9)  Torsemide 100 Mg Tabs (Torsemide) .... One by mouth in am and 1/2 by mouth in pm 10)  Valium 5 Mg Tabs (Diazepam) .... Once daily as needed 11)  Vesicare 10 Mg Tabs (Solifenacin succinate) .... Once daily 12)  Calcitriol 0.25 Mcg Caps (Calcitriol) .... One tablet by mouth once daily 13)  Bd U/f Short Pen Needle 31g X 8 Mm Misc (Insulin pen needle) .... To use qid with insulin pen 14)  Finasteride 5 Mg Tabs (Finasteride) .... One tablet by mouth once daily 15)  Metoprolol Tartrate 50 Mg Tabs (Metoprolol tartrate) .... Take 1 two times a day 16)  Allopurinol 100 Mg Tabs (Allopurinol) .... One by mouth a bed time  Other Orders: Specimen Handling (82956) TLB-BMP (Basic Metabolic Panel-BMET) (80048-METABOL)  Hypertension Assessment/Plan:      The patient's hypertensive risk group is category C: Target organ damage and/or diabetes.  Today's blood pressure is 144/66.  His blood pressure goal is < 130/80.  Patient Instructions: 1)  Please schedule a follow-up appointment in 2 months. Prescriptions: ALLOPURINOL 100 MG TABS (ALLOPURINOL) one by mouth a bed time  #30 x 11   Entered and Authorized by:   Stacie Glaze MD   Signed by:   Stacie Glaze MD on 12/10/2009   Method used:   Electronically to        [id number]* (retail)       [address]       [place], [place]  [address]       Ph: [phone] or [phone]       Fax: [phone]   RxID:   [id number] ALLOPURINOL 100 MG TABS (ALLOPURINOL) one by mouth a bed time  #30 x 11   Entered and Authorized by:   Stacie Glaze MD   Signed by:   Stacie Glaze MD on 12/10/2009   Method used:   Electronically to        [facility]* (retail)       [address]       [address] Shipping/mailing       [place], [place]  [address]  Ph: [phone]       Fax: [phone]   RxID:   [id number]

## 2010-07-31 ENCOUNTER — Encounter: Payer: Self-pay | Admitting: Internal Medicine

## 2010-08-01 ENCOUNTER — Ambulatory Visit: Payer: Self-pay | Admitting: Internal Medicine

## 2010-08-01 DIAGNOSIS — Z0289 Encounter for other administrative examinations: Secondary | ICD-10-CM

## 2010-08-05 ENCOUNTER — Other Ambulatory Visit: Payer: Self-pay | Admitting: Internal Medicine

## 2010-08-25 ENCOUNTER — Telehealth: Payer: Self-pay | Admitting: Internal Medicine

## 2010-08-25 NOTE — Telephone Encounter (Signed)
lmoam to return call °

## 2010-08-25 NOTE — Telephone Encounter (Signed)
Per d rjenkins- please order aic,lipid and liver

## 2010-08-25 NOTE — Telephone Encounter (Signed)
Walk in------pt has an appt on 10-15-2010 and would like to do labs for dm. Please order labs.

## 2010-08-28 LAB — GLUCOSE, CAPILLARY

## 2010-08-28 LAB — POCT I-STAT 4, (NA,K, GLUC, HGB,HCT)
Hemoglobin: 16.7 g/dL (ref 13.0–17.0)
Sodium: 140 mEq/L (ref 135–145)

## 2010-09-01 LAB — GLUCOSE, CAPILLARY: Glucose-Capillary: 148 mg/dL — ABNORMAL HIGH (ref 70–99)

## 2010-09-23 ENCOUNTER — Ambulatory Visit (INDEPENDENT_AMBULATORY_CARE_PROVIDER_SITE_OTHER): Payer: Medicare Other | Admitting: Vascular Surgery

## 2010-09-23 DIAGNOSIS — N186 End stage renal disease: Secondary | ICD-10-CM

## 2010-09-24 NOTE — Assessment & Plan Note (Signed)
OFFICE VISIT  [name], [name] DOB:  24-Oct-1950                                       09/23/2010 [id number]#:[id number]  Patient presents today for evaluation of AV access.  He is a pleasant 50- year black male with progressive renal insufficiency.  He had a left Cimino radiocephalic fistula placed by Dr. Hart Rochester in October 2010.  He has had stable renal function but recently has had some worsening of this.  He is seen today for evaluation of his access.  He reports that this in the past had an easily palpable thrill and less so recently.  PAST MEDICAL HISTORY:  Significant for hypertension, secondary hyperparathyroidism, insulin-dependent diabetes, coronary artery disease, morbid obesity, sleep apnea.  The only prior surgery was his left Cimino fistula.  SOCIAL HISTORY:  He is married with 3 children.  He is retired.  He does not smoke or drink alcohol.  FAMILY HISTORY:  Significant for premature atherosclerotic disease in his father and siblings.  REVIEW OF SYSTEMS:  Positive for weight loss.  He weighs 265 pounds.  He is 5 feet 10 inches tall.  PHYSICAL EXAMINATION:  A well-developed and well-nourished black male appearing his stated age in no acute stress.  Blood pressure 121/73, pulse 70, respirations 18.  His temperature is 98.6.  He is in no acute distress.  HEENT:  Normal.  He does have a palpable radial pulse bilaterally.  He does have a thrill in the wrist just above his Cimino fistula creation.  Neurologic:  No focal weakness or paresthesias.  Skin without ulcers or rashes.  I imaged his vein with SonoSite.  His cephalic vein is patent but small from the level of the arterial venous fistula up through the forearm. The vein becomes much better caliber at the antecubital space in the upper arm, although it is slightly deep in this area.  I discussed options with patient.  I do not see any evidence of correctable difficulty with his wrist  fistula, specifically no tributaries branches.  I have recommended placement of a new left upper arm AV fistula due to the large caliber of cephalic vein in the above- elbow position.  He understands the possibility of nonmaturation.  He wishes to defer this until after his son is out of home-schooling, in the first week of June.  He is scheduled for surgery on June 4 as an outpatient at [facility].    Larina Earthly, M.D. Electronically Signed  TFE/MEDQ  D:  09/23/2010  T:  09/24/2010  Job:  [id number]  cc:   Duke Salvia. Eliott Nine, M.D.

## 2010-09-26 ENCOUNTER — Emergency Department (HOSPITAL_COMMUNITY): Payer: Medicare Other

## 2010-09-26 ENCOUNTER — Emergency Department (HOSPITAL_COMMUNITY)
Admission: EM | Admit: 2010-09-26 | Discharge: 2010-09-26 | Disposition: A | Discharge status: Home / Self Care | Payer: Medicare Other | Attending: Emergency Medicine | Admitting: Emergency Medicine

## 2010-09-26 DIAGNOSIS — Z794 Long term (current) use of insulin: Secondary | ICD-10-CM | POA: Insufficient documentation

## 2010-09-26 DIAGNOSIS — I10 Essential (primary) hypertension: Secondary | ICD-10-CM | POA: Insufficient documentation

## 2010-09-26 DIAGNOSIS — E119 Type 2 diabetes mellitus without complications: Secondary | ICD-10-CM | POA: Insufficient documentation

## 2010-09-26 DIAGNOSIS — K219 Gastro-esophageal reflux disease without esophagitis: Secondary | ICD-10-CM | POA: Insufficient documentation

## 2010-09-26 DIAGNOSIS — M25579 Pain in unspecified ankle and joints of unspecified foot: Secondary | ICD-10-CM | POA: Insufficient documentation

## 2010-09-26 DIAGNOSIS — R0789 Other chest pain: Secondary | ICD-10-CM | POA: Insufficient documentation

## 2010-09-26 DIAGNOSIS — I2510 Atherosclerotic heart disease of native coronary artery without angina pectoris: Secondary | ICD-10-CM | POA: Insufficient documentation

## 2010-09-26 DIAGNOSIS — Y92009 Unspecified place in unspecified non-institutional (private) residence as the place of occurrence of the external cause: Secondary | ICD-10-CM | POA: Insufficient documentation

## 2010-09-26 DIAGNOSIS — W1789XA Other fall from one level to another, initial encounter: Secondary | ICD-10-CM | POA: Insufficient documentation

## 2010-10-07 NOTE — Assessment & Plan Note (Signed)
[facility]                            CARDIOLOGY OFFICE NOTE   NAME:[name], [name]                      MRN:          [id number]  DATE:10/19/2007                            DOB:          06/24/1950    Kiegan returns today for further management of his complex cardiac  history.   PROBLEM LIST:  1. Coronary artery disease.  He still has exertional angina.  His      stress Myoview February 02, 2007, was negative for scar ischemia.      His EF was around 36%.  2-D echocardiogram actually showed a normal      left ventricular systolic function, EF 65-70% dated February 28, 2007.  He is having no rest angina.  He has no orthopnea or PND.      His blood pressure has been under better control.  2. History of chronic diastolic congestive heart failure.  3. Severe and difficult to control hypertension.  4. Chronic renal insufficiency.  His last creatinine was 2.1 when      checked by Dr. Eliott Nine.  5. Hyperlipidemia followed by Dr. Lovell Sheehan.   MEDICATIONS:  His medications are listed in the chart and typed out for  his review today.  I have reviewed these and agree with his current plan  though it is extensive.   PHYSICAL EXAMINATION:  VITAL SIGNS:  His blood pressure today is 124/80.  This is remarkably good for Greggory Stallion.  His pulse is 72 and regular.  His  weight is 283 up 5.  HEENT:  Unchanged.  NECK:  Carotid upstrokes were equal bilaterally without bruits, no JVD.  Thyroid is not enlarged.  Trachea is midline.  LUNGS:  Clear.  HEART:  His PMI is difficult to palpate.  He has normal S1 and S2  without gallop.  ABDOMEN:  Exam is protuberant, good bowel sounds.  Organomegaly could  not be adequately assessed.  EXTREMITIES:  Reveal no cyanosis, clubbing or edema.  Pulses are  present.  NEUROLOGICAL:  Exam is intact.   I think Thaddus is doing pretty darn well considering.  He does have some  exertional chest discomfort which he probably will have  with his small  vessel disease.  I do not think there is any utility in repeating  another stress Myoview at this time.   He is on an excellent medical program and we have reviewed this today at  length.  I will plan on seeing him back again in 6 months.     Thomas C. Daleen Squibb, MD, [facility]  Electronically Signed    TCW/MedQ  DD: 10/19/2007  DT: 10/19/2007  Job #: [id number]   cc:   Duke Salvia. Eliott Nine, M.D.  Stacie Glaze, MD

## 2010-10-07 NOTE — Assessment & Plan Note (Signed)
OFFICE VISIT   [name], [name]  DOB:  05/06/1951                                       12/18/2008  EAVWU#:[id number]   This patient has been evaluated in the past for vascular access by Dr.  Darrick Penna 2-1/2  years ago.  He returns now with creatinine 2.4 and end-  stage renal disease but not yet on hemodialysis.  He is to be evaluated  for access at this time.[name]  He is right-handed.  He has never had  hemodialysis in the past.  A vein mapping  was performed today which  revealed an excellent cephalic vein on the left ranging in size from  0.49 to 0.68 cm in size.  This is larger than the cephalic vein on the  right side.   PHYSICAL EXAMINATION:  He does appear to have adequate cephalic vein in  the forearm for Cimino fistula and has an excellent brachial and radial  pulse palpable.  Blood pressure today is 130/66, heart rate 84,  respirations 14.   I have discussed with him the fistula and offered him a few options to  schedule this and he will check at home as to when he can schedule this  and be back in touch with [place] for a left forearm fistula.   Quita Skye Hart Rochester, M.D.  Electronically Signed   JDL/MEDQ  D:  12/18/2008  T:  12/19/2008  Job:  [id number]   cc:   Aram Beecham B. Eliott Nine, M.D.

## 2010-10-07 NOTE — Assessment & Plan Note (Signed)
[facility]                            CARDIOLOGY OFFICE NOTE   NAME:[name], [name]                      MRN:          [id number]  DATE:02/18/2007                            DOB:          11-28-1950    [name] comes in today because of increasing exertional chest tightness,  shortness of breath.   Because of this, we performed a stress Myoview on February 02, 2007.  He only exercised for 3 minutes and 32 seconds, though he showed no sign  or scar of ischemia.  His EF calculated at 36%.  This is comparable to a  stress study in 2006.  His EF is probably better than represented by the  stress scan.  Echocardiogram was recommended.   His last catheterization was November 23, 2003, which showed a 70% lesion in  a ramus branch, 40% in the diagonal, 90% in a small nondominant right.  His EF at that time was 60%.   His weight has increased, though [facility] without problems.  He has had some exertional chest  tightness, however, which is kind of intermittent, not consistent.   MEDICATIONS:  1. Amlodipine 10 mg daily.  2. Lopressor 150 mg p.o. b.i.d.  3. Aspirin 81 mg daily.  4. Plavix 75 mg daily.  5. Reglan 10 mg q.i.d.  6. Vytorin 10/40 daily.  7. Hydralazine 50 mg p.o. t.i.d.  8. Torsemide 100 mg in the morning, 50 in the evening.  9. Insulin.   His blood pressure today is 149/90.  His pulse is 64,  EKG shows normal  sinus rhythm with profound ST segment changes which are stable.  His  weight is 289 which is actually similar to what it was in December 2007.  HEENT:  He is bearded.  Normocephalic, atraumatic.  PERRLA.  Sclerae  muddy.  NECK:  Supple.  Carotids are full without bruits.  There is no JVD.  Thyroid is not enlarged.  Trachea is midline.  LUNGS:  Clear.  HEART:  A poorly appreciated PMI, normal S1 and S2 without gallop.  ABDOMEN:  Very protuberant.  No organomegaly appreciated.  EXTREMITIES:   Minimal edema.  Pulses were present bilaterally.  NEURO:  Intact.   ASSESSMENT:  [name] is having more exertional chest discomfort which  sounds like it could be some angina.  However, it is not always  consistent.  His stress Myoview is certainly comforting.   PLAN:  1. Add isosorbide mononitrate 60 mg p.o. q.a.m.  2. Check 2 D echocardiogram to assist left ventricular function.  3. See me back in about 2-3 weeks.     Thomas C. Daleen Squibb, MD, [facility]  Electronically Signed    TCW/MedQ  DD: 02/18/2007  DT: 02/18/2007  Job #: [id number]   cc:   Duke Salvia. Eliott Nine, M.D.  Orie Rout, M.D.

## 2010-10-07 NOTE — Procedures (Signed)
CEPHALIC VEIN MAPPING   INDICATION:  Preop evaluation.   HISTORY:  Chronic kidney disease.   EXAM:   The right cephalic vein is compressible.   Diameter measurements range from 0.23 to 0.57 cm.   The left cephalic vein is compressible.   Diameter measurements range from 0.49 to 0.81 cm.   See attached worksheet for all measurements.   IMPRESSION:  Patent bilateral cephalic veins with diameters as described  above and on the attached work sheet.   ___________________________________________  V. Charlena Cross, MD   CH/MEDQ  D:  11/19/2008  T:  11/19/2008  Job:  [id number]

## 2010-10-07 NOTE — Assessment & Plan Note (Signed)
[facility]                            CARDIOLOGY OFFICE NOTE   NAME:[name], [name]                      MRN:          [id number]  DATE:04/12/2007                            DOB:          10-17-1950    [name] comes in today for close followup of his multiple medical  problems.  Please see my note from February 18, 2007.   He had been having a fair amount of exertional angina.  We performed a  stress Myoview February 02, 2007, which showed his EF to be 36% with no  ischemia.  An echocardiogram was recommended to check on his left  ventricular function, which on February 28, 2007 showed normal left  ventricular systolic function.  He had mild LVH.   His Imdur was adjusted and his exertional angina is significantly  better.   MEDICATIONS:  Unchanged since last visit.  Please refer to that.  His  Imdur is at 60 mg a day.   He denies any orthopnea, PND, or peripheral edema.  He actually has been  walking on a regular basis.   EXAM:  Blood pressure today is 146/89, pulse 63 and regular, weight is  291.  HEENT:  Unchanged.  Carotid upstrokes are equal bilaterally without bruits.  No JVD.  Thyroid is not enlarged.  Trachea is midline.  LUNGS:  Clear to auscultation.  HEART:  Reveals a poorly-appreciated PMI.  He has a normal S1, S2  without S4.  ABDOMEN:  Protuberant with good bowel sounds.  EXTREMITIES:  No edema.  Pulses are present, but reduced.  NEURO:  Exam is intact.   [name] is stable from my standpoint.  I made no change in his  program.  I will see him back in 3 months.     Thomas C. Daleen Squibb, MD, [facility]  Electronically Signed    TCW/MedQ  DD: 04/12/2007  DT: 04/12/2007  Job #: [id number]   cc:   Aram Beecham B. Eliott Nine, M.D.

## 2010-10-07 NOTE — Assessment & Plan Note (Signed)
[facility]                         GASTROENTEROLOGY OFFICE NOTE   NAME:[name], [name]                      MRN:          [id number]  DATE:07/14/2007                            DOB:          1950-09-25    REASON FOR CONSULTATION:  Rectal bleeding and leakage.   HISTORY OF PRESENT ILLNESS:  [name] is a 60 year old African-  American male referred through the courtesy of Dr. Lovell Sheehan for  evaluation. He is suffering from constant rectal bleeding, consistent of  small amounts of blood staining his underclothes and coating his stools  and in the toilet water. He has trouble cleaning himself after a bowel  movement. He underwent a sphincterotomy for an anal fissure  approximately 4 years ago. He has also had banding in the past. He  underwent full colonoscopy by Dr. Charna Elizabeth in 2003. He reports no  change in his bowel habits. He is without rectal pain or itching. He  also complains of nausea. This is a constant and chronic problem. He  takes Reglan.   PAST MEDICAL HISTORY:  1. Hepatitis.  2. Hypertension.  3. Diabetes.  4. Coronary artery disease.  5. Status post myocardial infarction.  6. Arthritis.  7. Sleep apnea.  8. Renal insufficiency.  9. Status post hemorrhoidectomy.   FAMILY HISTORY:  Pertinent for heart disease in parents and siblings.   MEDICATIONS:  Include insulin, amlodipine, Lopressor, baby aspirin,  Plavix, Reglan, Vytorin, Torsemide, Vesicare and Flomax.   ALLERGIES:  CODEINE.   SOCIAL HISTORY:  He neither smokes or drinks. He is married and retired.   REVIEW OF SYSTEMS:  Positive for joint pain, back pain, and sleeping  problems.   PHYSICAL EXAMINATION:  VITAL SIGNS:  Pulse 60, blood pressure 128/70,  weight 278.  HEENT:  Extraocular movements intact.  Pupils equal, round, and reactive  to light and accommodation.  Sclerae are anicteric. Conjunctivae are  pink.  NECK:  Supple without tyromegaly, adenopathy, or  carotid bruits.  CHEST:  Clear to auscultation and percussion without adventitious  sounds.  CARDIAC:  Regular rhythm; normal S1, S2. There are no murmurs, gallops,  or rubs.  EXTREMITIES:  Full range of motion. No cyanosis, clubbing, or edema.  ABDOMEN:  Bowel sounds are normoactive.  Abdomen is soft, nontender.  There is a dime-sized midline reducible hernia, just superior to the  umbilicus. There are no abdominal masses or organomegaly.  RECTAL:  He has a fissure in the posterior midline that is relatively  non-tender. There are no rectal masses. Stool is Hemoccult negative.   IMPRESSION:  1. Rectal bleeding and soilage. I am not certain that this if from his      fissure in the absence of rectal pain. Internal hemorrhoids could      be causing his symptoms. More proximal colonic bleeding is less      likely.  2. Nausea. He could have an ulcer or non-ulcer dyspepsia, or      persistent gastroparesis despite Reglan.  3. Diabetes.  4. Coronary artery disease.   RECOMMENDATIONS:  1. Anusol HC suppositories.  2. Upper endoscopy and sigmoidoscopy (  to be done at the same time.)     [name], MD,FACG  Electronically Signed    RDK/MedQ  DD: 07/14/2007  DT: 07/14/2007  Job #: [id number]   cc:   Stacie Glaze, MD

## 2010-10-07 NOTE — Assessment & Plan Note (Signed)
[facility]                            CARDIOLOGY OFFICE NOTE   NAME:Top, [name]                      MRN:          [id number]  DATE:11/17/2006                            DOB:          Mar 10, 1951    [name] comes in today for followup and management of the following  issues.  1. Coronary artery disease.  He has his chronic stable angina.  He has      had no rest or nocturnal angina.  He has moderate left ventricular      systolic function with an EF of 35% to 40%.  2. Chronic systolic congestive heart failure.  3. Refractory hypertension which is actually under better control at      present.  4. Obesity.  5. Type 2 diabetes.  6. History of CVA, status post thrombolytic therapy.  7. Hyperlipidemia.   He has stopped working in [employer].  He is living off of his  retirement and disability.  He enjoys gardening which has relaxed him  quite a bit.  He says his blood pressure is the best it has ever been.   CURRENT MEDICATIONS:  1. Amlodipine 10 mg a day.  2. Lopressor 150 mg b.i.d.  3. Aspirin 81 mg a day.  4. Plavix 75 mg a day.  5. Reglan 10 mg q.i.d.  6. Vytorin 10/40 daily.  7. Hydralazine 50 mg p.o. t.i.d.  8. Humalog as directed.  9. Torsemide 100 mg in the morning, 50 mg in the evening.  10.Lantus 60 units at bedtime.  11.Vesicare 5 mg one cap daily.  12.Flomax 0.4 mg p.o. daily.  13.Testosterone every 2 weeks.   PHYSICAL EXAMINATION:  He is in no acute distress.  He is very pleasant  as always.  His blood pressure is 129/82, his pulse is 60 and regular.  His weight  is 290, down 6.  HEENT:  He wears glasses, sclerae are slightly muddy.  PERRLA.  Extraocular movements intact.  Facial symmetry is normal.  NECK:  Supple.  Carotid upstrokes are equal bilaterally without bruits.  There is no JVD.  Thyroid is not enlarged, trachea is midline.  LUNGS:  Clear.  HEART:  Reveals a poorly appreciated PMI.  He has a normal S1,  S2  without gallop.  ABDOMINAL EXAM:  Protuberant, good bowel sounds.  Organomegaly cannot be  assessed.  EXTREMITIES:  Reveal no obvious edema.  Pulses were present.  NEURO EXAM:  Intact.   EKG shows sinus brady with deep T-wave inversion in 1 aVL, also T-wave  inversion at V5 and V6.  Compared to previous ECG there has been no  significant change.   ASSESSMENT AND PLAN:  [name] is stable from a cardiovascular  standpoint.  I renewed sublingual nitroglycerin spray.  I have also  renewed his Plavix so he can get this through the [place].   I will plan on seeing him back again in October 2008.  At that time, he  will probably need a stress Myoview.     Thomas C. Daleen Squibb, MD, [facility]  Electronically Signed  TCW/MedQ  DD: 11/17/2006  DT: 11/17/2006  Job #: [id number]   cc:   Duke Salvia. Eliott Nine, M.D.

## 2010-10-07 NOTE — Assessment & Plan Note (Signed)
[facility]                         GASTROENTEROLOGY OFFICE NOTE   NAME:[name], [name]                      MRN:          [id number]  DATE:08/31/2007                            DOB:          [birth year]    PROBLEMS:  1. Nausea.  2. Anal fissure.   [name] has returned for evaluation of above.  Upper endoscopy was  unrevealing.  Gastric emptying scan was also normal.  [name]  continues to complain of persistent nausea.  Anoscopy and sigmoidoscopy  demonstrated an anal fissure.  [name] continues to complain of  rectal itching and discomfort following defecation.  He is scheduled to  see Dr. Ovidio Kin for consideration of a sphincterotomy.  He  continues on rectal suppositories.  He is on no new medications.   PHYSICAL EXAMINATION:  He is a healthy-appearing male.  Pulse 68, blood  pressure 120/76, weight 278.   IMPRESSION:  1. Persistent nausea.  Despite his negative gastric emptying scan, I      suspect that he may have gastroparesis causing his nausea.      Medication effect is also a consideration, though he has been on      his current medications for over a year.  2. Symptomatic anal fissure.   RECOMMENDATIONS:  1. Increase Reglan to 20 mg one-half hour a.c. and h.s.  2. Botox injection of his anal fissure.  If this does not improve his      symptoms sufficiently, he will keep his appointment with Dr. Ezzard Standing      for consideration of a sphincterotomy.     Barbette Hair. Arlyce Dice, MD,FACG  Electronically Signed    RDK/MedQ  DD: 08/31/2007  DT: 08/31/2007  Job #: [id number]

## 2010-10-07 NOTE — Assessment & Plan Note (Signed)
[facility]                            CARDIOLOGY OFFICE NOTE   NAME:[name], [name]                      MRN:          [id number]  DATE:07/11/2008                            DOB:          August 05, 1950    [name] comes in today for followup.   He had some exertional angina today while moving a couch with his son.  He was clearly in an isometric maneuver.   He has had really no other angina to speak of.  He does have his chronic  dyspnea on exertion.   The good news is that his blood pressure has been under remarkably good  control.  His creatinine is stable at around 2.0 per his visit with Dr.  Eliott Nine according to him.   He is on extensive medications which are outlined in the chart.  The  changes are he is now on higher dose of Avandamet 08/998 mg daily.  His  Lantus has been increased to 90 units nightly.  His Lopressor is now at  50 q.i.d., and his Vytorin has been discontinued.  He is on Crestor 20  mg p.o. daily.   Last stress Myoview was in September 2009 which was stable.   PHYSICAL EXAMINATION:  VITAL SIGNS:  His blood pressure today is 122/80,  his pulse is 61 and regular.  His electrocardiogram shows sinus rhythm  with ST-segment changes, particularly laterally which are basically  baseline.  He has an old inferior wall infarct pattern which is stable.  His weight is 283 which is stable.  HEENT:  Other than a beard and arcus senilis muddy sclerae are is  baseline.  NECK:  Supple.  Carotid upstrokes are equal bilaterally without bruits.  There is no JVD.  Thyroid is not enlarged.  CHEST:  Lungs are clear to auscultation.  HEART:  A poorly appreciated PMI.  Normal S1 and S2.  No gallop.  ABDOMEN:  Protuberant.  Good bowel sounds.  Organomegaly could not be  adequately assessed.  EXTREMITIES:  No significant edema.  Pulses were present, but reduced.  NEUROLOGIC:  Grossly intact.  SKIN:  Unremarkable.   ASSESSMENT AND PLAN:  [name]  is doing well for his baseline situation.  I am delighted with his blood pressure.  I have advised him to avoid  isometric lifting.  He will continue to carry his sublingual  nitroglycerin.  I will see him back in 6 months, at which time we will  need to objectively assess his coronary disease.    Thomas C. Daleen Squibb, MD, [facility]  Electronically Signed   TCW/MedQ  DD: 07/11/2008  DT: 07/11/2008  Job #: [id number]

## 2010-10-07 NOTE — Assessment & Plan Note (Signed)
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  Patient is not yet on hemodialysis.  On exam today the  fistula has a good pulse and palpable thrill with good flow up to the  antecubital area.  There is no evidence of steal distally.  His hand is  well-perfused.  Does occasionally have some numbness in digits 3, 4, and  5 of the left hand, but I do not think these are related to circulation.  This fistula could be utilized around February 1 if necessary but he is  not yet on dialysis and hopefully will have plenty of time to mature  before it is needed.  Return to see [place] on a p.r.n. basis.   Quita Skye Hart [place], M.D.  Electronically Signed   JDL/MEDQ  D:  05/07/2009  T:  05/08/2009  Job:  [id number]   cc:   Duke Salvia. Eliott Nine, M.D.

## 2010-10-08 ENCOUNTER — Other Ambulatory Visit (INDEPENDENT_AMBULATORY_CARE_PROVIDER_SITE_OTHER): Payer: Medicare Other | Admitting: Internal Medicine

## 2010-10-08 DIAGNOSIS — E119 Type 2 diabetes mellitus without complications: Secondary | ICD-10-CM

## 2010-10-08 LAB — BASIC METABOLIC PANEL
BUN: 56 mg/dL — ABNORMAL HIGH (ref 6–23)
Chloride: 88 mEq/L — ABNORMAL LOW (ref 96–112)
Creatinine, Ser: 4.8 mg/dL (ref 0.4–1.5)
Glucose, Bld: 277 mg/dL — ABNORMAL HIGH (ref 70–99)
Potassium: 3.1 mEq/L — ABNORMAL LOW (ref 3.5–5.1)

## 2010-10-08 LAB — HEMOGLOBIN A1C: Hgb A1c MFr Bld: 12.9 % — ABNORMAL HIGH (ref 4.6–6.5)

## 2010-10-08 LAB — LIPID PANEL
HDL: 47.9 mg/dL (ref >39.00)
VLDL: 113.8 mg/dL — ABNORMAL HIGH (ref 0.0–40.0)

## 2010-10-08 LAB — HEPATIC FUNCTION PANEL
ALT: 11 U/L (ref 0–53)
Alkaline Phosphatase: 92 U/L (ref 39–117)
Bilirubin, Direct: 0.1 mg/dL (ref 0.0–0.3)
Total Bilirubin: 0.4 mg/dL (ref 0.3–1.2)
Total Protein: 6.8 g/dL (ref 6.0–8.3)

## 2010-10-08 LAB — LDL CHOLESTEROL, DIRECT: Direct LDL: 99.5 mg/dL

## 2010-10-10 NOTE — Discharge Summary (Signed)
[name], [name] NO.:  [id number]   MEDICAL RECORD NO.:  [id number]                   PATIENT TYPE:  INP   LOCATION:  2004                                 FACILITY:  [facility]   PHYSICIAN:  Olga Millers, M.D.                DATE OF BIRTH:  01-29-1951   DATE OF ADMISSION:  12/29/2002  DATE OF DISCHARGE:  01/01/2003                           DISCHARGE SUMMARY - REFERRING   PROCEDURES:  Adenosine Cardiolite on January 01, 2003.   REASON FOR ADMISSION:  Please refer to dictated admission note.   LABORATORY DATA:  Cardiac enzymes:  Marginally elevated total CPK at 198 on  admission, otherwise normal, multiple MB and troponin-I marker levels.  BNP  less then 30.  Lipid profile:  Total cholesterol 170, triglycerides 273, HDL  53, LDL 62 (ratio 3.2).  TSH 2.03.  Normal CBC on admission.  INR 0.9.  Normal electrolytes.  BUN 17, creatinine 1.7 on admission.  Normal liver  enzymes.  Hemoglobin A1C of 6.9.   Chest x-ray:  Cardiomegaly;  mild pulmonary vascular congestion;  stable,  mild bronchitic changes.   HOSPITAL COURSE:  Following presentation to the emergency room with  complaint of progressive chest pain, in the context of known non-critical  coronary artery disease, the patient was admitted for further evaluation.  Multiple serial cardiac markers were normal.   Although the patient's presenting symptoms were felt to be typical/atypical,  he continued to have recurrent chest heaviness with radiation to the neck  which awoke him.  Electrocardiogram remained unchanged.  However, given the  recurrent symptoms, initial plan was to cancel the scheduled stress test and  to proceed with re-look coronary angiogram.  During the patient's mild renal  insufficiency, he was placed on hydration therapy and treated with Mucomyst  as well.   Repeat cardiac markers were again all within normal limits.  Upon further  discussion with the patient and his wife,  however, it was felt that they  would prefer not proceeding with re-look coronary angiogram.  He reported no  further chest pain, and, given that more then 24 hours had elapsed, Dr.  Diona Browner felt that the patient was stable enough to proceed with the  original plan of a stress Cardiolite.   The patient was scheduled for an Adenosine Cardiolite on scheduled day of  discharge.  He did note some throat tightness and sharp chest pain during  the procedure, but this resolved after recovery.  No significant ST changes  from the chronic, baseline T-wave changes were noted.  Subsequent review of  perfusion images revealed no evidence of ischemia with calculated ejection  fraction of 46%.  There was question of inferior wall scar versus  diaphragmatic attenuation.   No further cardiac workup was recommended, and plan was to continue with  medical therapy.   Medication adjustments this admission:  Adjustment of Humibid LA.   DISCHARGE MEDICATIONS:  1. Humibid LA 600 mg b.i.d. (new).  2. Amaryl 2 mg b.i.d.  3. Metoprolol 100 mg q.a.m./50 mg q.p.m.  4. Furosemide 40 mg b.i.d.  5. Diovan 320 mg daily.  6. Catapres TTS 0.2 mg every week.  7. Prilosec 20 mg daily.  8. Aspirin 81 mg daily.  9. Felodipine 5 mg b.i.d.  10.      Lipitor 40 mg daily.  11.      Pyridoxine 15 mg daily.  12.      Folic acid 2.5 mg daily.  13.      Cyanocobalamin 1 mg daily.  14.      Nitro-Dur (0.2 mg) p.r.n.  15.      Nitrostat 0.4 mg p.r.n.   ACTIVITY:  The patient is to resume baseline level of activity as tolerated.   DIET:  Maintain a low fat/cholesterol diet.   FOLLOWUP:  The patient is scheduled to follow up with [facility] on January 15, 2003, at 4 p.m.   DISCHARGE DIAGNOSES:  1. Non-ischemic chest pain.     a. Normal serial cardiac markers.     b. Negative Adenosine Cardiolite;  ejection fraction of 46%, January 01, 2003.     c. Non-critical coronary artery  disease/hyperdynamic left ventricular        function, coronary angiogram in February 2003.  2. Mild chronic renal insufficiency.  3. Hypertension.  4. Type 2 diabetes mellitus.  5. Dyslipidemia.  6. Gastroesophageal reflux disease.  7. Obesity.      Gene Serpe, P.A. [facility]                      Olga Millers, M.D.    GS/MEDQ  D:  01/01/2003  T:  01/02/2003  Job:  [id number]   cc:   Stacie Glaze, M.D. [facility]

## 2010-10-10 NOTE — Discharge Summary (Signed)
[name], CANNING NO.:  [id number]   MEDICAL RECORD NO.:  [id number]          PATIENT TYPE:  INP   LOCATION:  2017                         FACILITY:  [facility]   PHYSICIAN:  Olga Millers, M.D. [facility] OF BIRTH:  [birth year]   DATE OF ADMISSION:  09/21/2005  DATE OF DISCHARGE:  09/26/2005                           DISCHARGE SUMMARY - REFERRING   DISCHARGE DIAGNOSES:  1.  Left side cerebrovascular accident treated with tPA.  2.  Malignant hypertension.  3.  Chronic renal insufficiency.  4.  Moderate severe left ventricular hypertrophy.  5.  Hyperlipidemia.  6.  Obesity.  7.  Noncompliance.  8.  Hypokalemia.   History as noted below.   BRIEF HISTORY:  [name] is a 60 year old African-American male who  presents with consistent chest discomfort relieved with rest and possibly a  sublingual nitroglycerin.  However, approximately 4 days prior to admission  he noticed a change in the type of discomfort and he has taken 12  nitroglycerins over the preceding 4 days.  He feels that they occur with  minimal exertion; and he states that his discomfort begins in his head,  radiating down into his right eye and then into his chest and right arm.  He  feels that this is different from his usual angina.  He saw Dr. Marrian Salvage in  the office, today, and his systolic blood pressure was noted to be elevated;  thus, his admission.  At the time of admission to [facility] it was 224/137.   History is notable for coronary artery disease by catheterization in 2005  with recommended medical therapy and an EF of 50-55% by echocardiogram in  February 2006.  Diabetes, hypertension, hyperlipidemia, chronic renal  insufficiency with creatinine recently greater than 3, GERD, obstructive  sleep apnea on CPAP, osteoarthritis, peripheral neuropathy, history of  hepatitis, possibly a rectal bleed secondary to hemorrhoids, and GI  intolerance to aspirin.   LABORATORY DATA:  EKGs  throughout his stay showed normal sinus rhythm, left  axis deviation, LVH, left anterior fascicular block slightly delayed R wave,  nonspecific ST-T wave changes.   Chest x-ray, on admission, showed stable cardiomegaly without acute  findings.   Head CT:  Showed __________ decreased attenuation in the left cerebellar  hemisphere likely a remote infarct.  There is a third focus of indeterminate  low density in a left cerebellar hemisphere; it may be artifactual due to  adjacent beam hardening artifact. MRI was further recommended.  Mild atrophy  and chronic microvascular ischemic changes.  MRI was performed; it showed  mild chronic changes, no acute abnormality.  There is mild, atherosclerotic  disease in the right posterior cerebral artery with several areas of  stenosis, otherwise negative.  There is also mild internal carotid artery  stenosis bilaterally with both vertebral arteries that were patent.   Admission weight was 261.8 pounds; discharge weight is in kilograms of  115.6.  Admission H&H was 16.5 and 49.6, normal indices, platelets 257, WBCs  4.3.  Subsequent hematologies were essentially unremarkable.  At the time of  discharge H&H was 16.3/49.4, normal indices, platelets 315,  WBCs 5.0.  Admission PTT was 29, PT 12.5.  Sodium was 138, potassium 4.0, BUN 17,  creatinine 1.8, glucose 190, normal LFTs.  Albumin was slightly low at 3.3.  Magnesium was 1.9, calcium 9.3.  Subsequent chemistries showed an increase  in his BUN and creatinine on May 1; it was 27 and 3.2;  May 3 it was 43 and  3.3; on May 4 it was 42 and 3.1; and on May 5 it was 39 and 2.8.  Hemoglobin  A1c on May 2 it was 8.6; and on May 5 it was 8.5.  CK MBs were negative x2  troponin was 0.05 on admission.  BNP was 89.  TSH on admission was 4.218.  Fasting lipids on the 1st showed a total cholesterol of 238, triglycerides  204, HDL 47, LDL 150.  Homocystine levels were elevated at 20.4 on the 2nd.   HOSPITAL COURSE:   Mr. Brodhead was admitted to the unit 2000 by Lavella Hammock  and Dr. Daleen Squibb.  A cardiology consult was obtained.  CT was obtained prior to  beginning heparin, given his hypertension.  Renal consult was obtained on  09/21/2005.  Medications were adjusted and nitroglycerin drip was gradually  decreased.  The patient's wife states that the patient does not take his  medications on a regular basis; and he occasionally skips doses, although  the patient denied this.  There was also reports that the patient eats what  he wants.  Pharmacy assisted with medication adjustments and recommendation  given the use of polypharmacy.  The troponin was increased with a elevated  LDL.  With further medications his blood pressures improved.  Nephrology  continued to believe that his malignant hypertension was a large issue of  compliance of medications and diet.  Nutrition also saw the patient to  remind him of a low salt, diabetic diet.  Neurology consult was obtained on  May 1 for stroke.  He was found to have some right upper extremity and  lower extremity weakness; and right facial droop; tPA was administered.  Echocardiogram was performed; however, the reports are not to in the  computer at the time of this dictation.  Dr. Daleen Squibb noted moderate to severe  LVH.  Carotid duplexes were also obtained and revealed a right 40-60% ICA  stenosis nothing on the left.  Over the next several days his medications  were adjusted, as well as  potassium supplementation for low potassium.  Renal agreed with holding Avapro and minoxidil. OT and PT assisted with  ambulation.  By May 4 his blood pressure improved significantly.  Medications continued to be further adjusted; and discharge planning was  begun.  Home health with case management was assisted for PT and OT as well  as diabetic blood pressure needs.  By Sep 26, 2005 neurology, nephrology, and  cardiology all felt that the patient could be discharged  home.  DISPOSITION:  He was asked to maintain an ADA, renal, and low salt diet.  His activities were not restricted.  He will receive PT and OT sessions as  an outpatient.  He was asked to bring all medications to all appointments to  avoid polypharmacy.   DISCHARGE MEDICATIONS:  His new medications at the time of this chart  include:  1.  Amiodarone 5 mg daily.  2.  His Protonix was increased to 40 mg b.i.d.  3.  Reglan increased to 10 mg q.a.c. and h.s.  4.  Vytorin was increased to 10/80 q.h.s.  5.  His Demadex was decreased to 50 mg b.i.d.  6.  He was asked to continue Amaryl 2 mg b.i.d.  7.  Lopressor 150 mg b.i.d.  8.  Avandia 4 mg daily.  9.  Aspirin 81 mg daily.  10. Plavix 75 daily.  11. __________  75 mg t.i.d. as needed.  12. Fibercon 625 two tablets daily.  13. Lantus 30 units q.h.s.  14. Phenergan 25 mg b.i.d. p.r.n.  15. Darvocet p.r.n.  16. Lunesta 2 mg q.h.s. p.r.n.  17. He also received new prescription for potassium 20 mEq every day for 1      week per renal.   FOLLOWUP:  He will follow up with Dr. Daleen Squibb on May 24 at 12:15.  He was asked  to arrange follow up appointment with Dr. Pearlean Brownie for 2-3 months; and to  arrange follow up with Dr. Eliott Nine at [facility].  I have left a message  at the office that they need to call Mr. Askari at home to arrange a BMET  this week to reassess his potassium and his kidney function.  He was asked  to begin a blood pressure diary, and to bring these to all appointments as  well.   DISCHARGE TIME:  Greater than 30 minutes.      Joellyn Rued, P.A. [facility]    ______________________________  Olga Millers, M.D. [facility]    EW/MEDQ  D:  09/26/2005  T:  09/28/2005  Job:  [id number]   cc:   Thomas C. Wall, M.D.  [address]  [address]  [place]  [place] [address]   in Neurology Dr. Wynonia [facility]   Dr. Lovell Sheehan

## 2010-10-10 NOTE — H&P (Signed)
[place]  Patient:    [name], [name] Visit Number: [id number] MRN: [id number]          Service Type: MED Location: [phone] Attending Physician:  Ron Parker Dictated by:   Doylene Canning. Ladona Ridgel, M.D. [facility] Admit Date:  04/01/2001 Discharge Date: 04/03/2001   CC:         Stacie Glaze, M.D. [facility]   History and Physical  ADMITTING DIAGNOSIS:  Unstable angina.  HISTORY OF PRESENT ILLNESS:  The patient is a very pleasant 60 year old male with a history of coronary artery disease, hypertension (for 30 years), who is admitted for evaluation of chest pain.  The patient has a history of catheterization back in November of 2002 and at that time, he had high-grade non-dominant RCA disease and was treated medically.  His LV function was supranormal.  Since then, he has had stable exertional chest pain symptoms, typically developing shortness of breath and chest pressure when he exerts himself but resolves then with rest.  Today, he did some heavy manual labor and 15 minutes later while driving home, developed squeezing substernal chest pain radiating to the neck and jaw.  The pain also radiated into the shoulder and he also noted some tingling in his teeth.  The pain persisted until he took nitroglycerin, which resulted in resolution of his symptoms.  He denies any associated fevers, chills, diaphoresis, syncope or near-syncope.  He is admitted for additional evaluation.  PAST MEDICAL HISTORY: 1. Diabetes mellitus times a year. 2. He has a history of hypertension x24 years. 3. He has a history of chronic renal insufficiency presumed secondary to    #1 and 2. 4. He has a history of hyperlipidemia.  FAMILY HISTORY:  Notable for a mother who is alive and well, a father who died of renal and coronary disease at 34.  He has two brothers, one of whom died of complications of kidney dysfunction and heart disease.  He had one sister who died of a  massive MI at age 33.  SOCIAL HISTORY:  The patient is married.  He denies tobacco or ethanol abuse and works for [employer].  REVIEW OF SYSTEMS:  He denies any hearing problems.  He wears glasses for visual acuity.  He denies any swallowing problems.  He does have reflux symptoms but denies hematemesis, constipation or diarrhea or vomiting.  He has nocturia x3 but no hematuria.  He has arthritis in his ankles, feet, knees and hips.  Otherwise, his review of systems is negative.  MEDICATIONS: 1. Toprol-XL 100 mg a day. 2. Norvasc 10 mg a day. 3. Zocor 20 mg a day. 4. Glyburide 5 mg in the a.m. and p.m. 5. Aspirin per day. 6. Aciphex per day.  PHYSICAL EXAMINATION:  GENERAL:  He is a pleasant, well-appearing, moderately obese middle-aged man in no distress.  VITAL SIGNS:  His blood pressure today was 154/86.  The pulse is between 70 and 80.  The respirations are 16.  NECK:  No jugular venous distention.  There was no thyromegaly.  The trachea was midline.  HEENT:  Normocephalic and atraumatic.  Pupils equal and round.  Oropharynx was moist.  LUNGS:  Clear bilaterally to auscultation.  There was an S4 gallop.  There were no murmurs appreciated.  ABDOMEN:  Soft, nontender, nondistended.  EXTREMITIES:  No clubbing, cyanosis, or edema.  Pulses were 2+ and symmetric.  NEUROLOGIC:  He was alert and oriented x3 and his cranial nerves were grossly intact  and the strength was symmetric and normal throughout.  LABORATORY AND ACCESSORY DATA:  EKG demonstrates normal sinus rhythm with LVH and ST-T changes consistent with strain.  Compared to the previous EKGs, his strain changes are not as severe today.  His labs include a creatinine of 1.8, a platelet count of 238,000, a white count of 5.7.  IMPRESSION: 1. Recurrent chest pain and shortness of breath with symptoms worrisome for    unstable angina. 2. Known coronary artery disease with high-grade non-dominant right  coronary    artery disease. 3. Hypertension. 4. Diabetes. 5. Chronic renal insufficiency. 6. Hyperlipidemia.  DISCUSSION:  We will plan to admit the patient for serial cardiac enzymes.  If they are negative, we will proceed with exercise Cardiolite stress testing. If they are positive, then we would recommend left heart catheterization with pre-hydration prior to the catheterization secondary to his chronic renal insufficiency. Dictated by:   Doylene Canning. Ladona Ridgel, M.D. [facility] Attending Physician:  Ron Parker DD:  07/13/01 TD:  07/14/01 Job: [id number]

## 2010-10-10 NOTE — Discharge Summary (Signed)
[place]  Patient:    [name], [name]                        MRN: [id number] Adm. Date:  [id number] Disc. Date: [id number] Attending:  Angelena Sole. Dictator:   Janora Norlander, A.N.P.-C CC:         Dr. Geoffery [facility]  Dr. Georgina [facility]   Discharge Summary  ADMISSION NUMBER:  [id number].  DATE OF BIRTH:  05/31/1950.  ADMISSION DIAGNOSES: 1. Atypical chest pain. 2. Abdominal pain.  DISCHARGE DIAGNOSIS:  Malignant hypertension.  MEDICATIONS ON DISCHARGE: 1. Toprol XL one p.o. q.d. 2. Protonix 40 mg one p.o. q.d. 3. Accupril 40 mg p.o. q.d. first dose was July 05, 2000.  BRIEF HISTORY OF ADMISSION:  On July 01, 2000, the patient was admitted complaining of chest pain and abdominal pain.  He also complained of cough, congestion and shortness of breath and right-sided pain.  He complained of left-sided chest pressure radiating into his neck.  He felt very weak.  He states that he had leg pain and he felt that his legs were going to collapse. He was admitted via the ER to rule out an MI, rule out SBO, rule out PE.  He received serial EKGs, CPK, troponin.  His troponin  was 0.08 x 2 then normalized to 0.4.  His CPKs normalized.  He had an echo demonstrating overall left ventricular systolic function was normal, left ventricular ejection fraction was estimated in the 55 to 65% range, no left ventricular regional wall motion abnormalities, left ventricular wall thickness was markedly increased.  There was a mild MVR, there was normal left ventricular function, severe concentric left ventricular hypertrophy, __________ SAN, no LVOT gradient, mild MR.  On July 02, 2000, he also had noninvasive cardiology exercise stress test which was clinically positive but electrically negative for ischemia.  GI:  GI was consulted on July 03, 2000.  Examination was held pending results of cardiology. He received his CAT scan  of the abdomen and pelvis on July 04, 2000, the results of which were normal.  July 05, 2000, on examination the patients vital signs are 97.8; his orthostatics are lying down 170/100 with pulse of 86, sitting 204/107 with pulse of 76, standing 180/100 with pulse of 80. The patient reports that he experienced mild dizziness with standing.  His labs are sodium 140, potassium 4.4, glucose 112.  His creatinine is 1.8, BUN is 19.  PTT is 31.  CBC is normal except RDW of 15.3.  He is awake, alert and oriented x 3, bed and in chair.  Heart is regular rate and rhythm without a murmur.  Lungs are clear to auscultation bilaterally.  His abdomen has bowel sounds x 4.  He is experiencing abdominal pain over the right lower quadrant directly over some musculature.  Other than that, his abdomen is benign.  He is being discharged on the above medications.  ACTIVITY:  His activity is to increase as tolerated.  DIET:  His diet is to increase as tolerated.  FOLLOW-UP:  He is to follow up with Dr. Lovell Sheehan in two weeks. DD:  07/05/00 TD:  07/06/00 Job: [id number]

## 2010-10-10 NOTE — Assessment & Plan Note (Signed)
[facility]                            CARDIOLOGY OFFICE NOTE   NAME:[name], [name]                      MRN:          [id number]  DATE:05/10/2006                            DOB:          [birth year]    HISTORY OF PRESENT ILLNESS:  Mr.  [name] returns today for further  management of the following issues:  1. Coronary artery disease.  He is having no angina.  He has moderate      left ventricular systolic dysfunction with an EF around 35-40%.  2. Refractory hypertension.  3. Obesity.  4. Type 2 diabetes.  5. History of CVA status post thrombolytic therapy.  6. Hyperlipidemia.   I saw him last Oct 15, 2005.  He was recovering from his stroke.  He is  now enrolled at [facility] in the Diabetes  Rehabilitation and Management Program.  This was arranged by his wife  who is a nurse over there.   He is off Avandia per my recommendation.  He is now on Duetact which is  a combination of Actos and Amaryl.  He says his blood sugars are not  doing well.   CURRENT MEDICATIONS:  1. Amlodipine 10 mg daily.  2. Duetact 30/2 mg daily.  3. Lopressor 150 mg p.o. b.i.d.  4. Aspirin 81 mg daily.  5. Plavix 75 mg daily.  6. Reglan 10 mg q.i.d.  7. Vytorin 10/40 q.p.m.  8. Torsemide 100 mg in the morning, 50 mg in the evening.  9. Lantus 30 units a day.  10.Hydralazine 50 mg t.i.d.  11.Vesicare 5 mg daily.  12.Flomax 0.4 mg daily.  13.Androderm 5 mg daily.   PHYSICAL EXAMINATION:  GENERAL:  He looks good today.  His weight,  unfortunately, has gone upwards to 296.  VITAL SIGNS:  Blood pressure 148/77, pulse 70 and regular.  HEENT:  Unremarkable.  Carotid upstrokes are equal bilaterally with a  systolic sound on the right side.  LUNGS:  Clear.  HEART:  Nondisplaced PMI.  Normal S1, S2.  ABDOMEN:  Protuberant, good bowel sounds.  EXTREMITIES:  Edema 1+, left greater than right.  Pulses are poorly  appreciated.   STUDIES:   Electrocardiogram shows sinus rhythm with deep T wave ST  changes in the lateral leads.  This is unchanged.   ASSESSMENT/PLAN:  [name] seems to be doing fairly well from his  Cardiovascular status.  He has all kinds of ongoing issues as is usual.  I am delighted he is in the diabetic program.  Hopefully, he can loose  some weight and tighten his blood sugars.  His blood pressure has been  running around 130.  I have renewed his torsemide until he gets his  script from the [place].  I have made no other changes.  I plan on seeing him  back in six months.     Thomas C. Daleen Squibb, MD, [facility]     TCW/MedQ  DD: 05/10/2006  DT: 05/11/2006  Job #: [id number]   cc:   Duke Salvia. Eliott Nine, M.D.

## 2010-10-10 NOTE — Op Note (Signed)
[name], [name]                           ACCOUNT NO.:  [id number]   MEDICAL RECORD NO.:  [id number]                   PATIENT TYPE:  AMB   LOCATION:  ENDO                                 FACILITY:  [facility]   PHYSICIAN:  Charna Elizabeth, M.D.                   DATE OF BIRTH:  1950/07/02   DATE OF PROCEDURE:  01/11/2002  DATE OF DISCHARGE:                                 OPERATIVE REPORT   PROCEDURE PERFORMED:  Colonoscopy.   ENDOSCOPIST:  Charna Elizabeth, M.D.   INSTRUMENT USED:  Olympus video colonoscope.   INDICATIONS FOR PROCEDURE:  This 60 year old African-American male with  history of rectal bleeding.  Rule out colonic polyps, masses, hemorrhoids,  etc.   PREPROCEDURE PREPARATION:  Informed consent was procured from the patient.  The patient was fasted for eight hours prior to the procedure and prepped  with a bottle of magnesium citrate and a gallon of NuLytely the night prior  to the procedure.  </PREPROCDUREPREPROCEDURE  VITAL SIGNS:  The patient had stable vital signs.  NECK:  Supple.  CHEST:  Clear to auscultation.  S1 and S2 regular.  ABDOMEN:  Soft with normal bowel sounds.   DESCRIPTION OF PROCEDURE:  The patient was placed in the left lateral  decubitus position and sedated wtih 66 mg of Demwith and 6 mg of Versed  intravenously.  Once the patient was adequately sedated and maintained on  low-flow oxygen and continuous cardiac monitoring, the Olympus video  colonoscope was advanced from the rectum to the cecum and terminal ileum  without difficulty.  There was some residual stool in the colon.  Multiple  washings were done.  Visualization was adequate.  Prominent inflamed  internal hemorrhoids were seen on retroflexion.  These were moderate in  size.  There was no evidence of fresh bleeding from these hemorrhoids.  The  rest of the colonic mucosa of the cecum appeared normal except for a few  right-sided diverticula.  The terminal ileum was normal in  appearance.   IMPRESSION:  1. Moderate size inflamed internal hemorrhoids.  2. A few scattered right-sided diverticula.  3. Normal-appearing left and transverse colon and terminal ileum.   RECOMMENDATIONS:  1. Anusol-HC 2.5% suppositories have been advised for the patient, 1 p.o. q.     h.s. #30 have been presh. s.ed.  2. A high-fiber diet has been advocated.  3. Liberal fluid intake has been advised, along with a high-fiber diet.  4.     Outpatient followup in the next 2-3 weeks.  Further recommendations as     needed.  5. Repeat colorectal cancer screening in the next 5-10 years unless the     patient develops any abnormal symptoms in the interim.  Charna Elizabeth, M.D.    JM/MEDQ  D:  01/19/2002  T:  01/23/2002  Job:  [id number]   cc:   Stacie Glaze, M.D. [facility]   Maisie Fus C. Wall, M.D. [facility]

## 2010-10-10 NOTE — Discharge Summary (Signed)
NAMECHRISTIE, COPLEY NO.:  [id number]   MEDICAL RECORD NO.:  [id number]                   PATIENT TYPE:  INP   LOCATION:  3301                                 FACILITY:  [facility]   PHYSICIAN:  Learta Codding, M.D. [facility]             DATE OF BIRTH:  05-Aug-1950   DATE OF ADMISSION:  06/27/2002  DATE OF DISCHARGE:  06/28/2002                           DISCHARGE SUMMARY - REFERRING   PROCEDURES:  Exercise stress Cardiolite on 06/28/02.   REASON FOR ADMISSION:  The patient is a 60 year old male, with known  coronary artery disease by previous catheterizations, treated medically, and  followed by Dr. Valera Castle, with multiple cardiac risk factors, and chronic  renal insufficiency, who presented to the office with symptoms suggestive of  crescendo angina pectoris.  He was admitted directly to [facility]  for further evaluation and treatment of unstable angina pectoris.  Please  refer to dictated admission note for full details.   LABORATORY DATA:  CBC normal.  INR 0.9.  Sodium 137, potassium 3.7, glucose  82, BUN 18, creatinine 1.8 on admission.  Liver enzymes normal.  BUN 16,  creatinine 1.7 at discharge.  Potassium 3.3 at discharge.  Cardiac enzymes:  Normal MB and troponin-I markers (x3).  Lipid profile:  Total cholesterol  208, triglycerides 207, HDL 57, LDL 110 (ratio 3.6).  TSH 3.2.   HOSPITAL COURSE:  The patient was admitted directly from the office, where  he was started on intravenous nitroglycerin, and placed on intravenous  heparin at time of arrival.  Aspirin and beta blocker were continued.   Serial cardiac enzymes were negative for myocardial infarction, and the  patient was thus referred for a non-invasive workup.   The patient successfully completed an adequate treadmill test achieving 86%  of PMHR with 8.8 METS.  He exercised a total of 7 minutes and 15 seconds  with only mild chest pressure at peak exercise which resolved  spontaneously  during early recovery.  Blood pressure rose from 128/82 baseline to a  maximum of 196/99.  There were no significant ST changes from baseline.  Perfusion imaging revealed no evidence of ischemia;  calculated ejection  fraction was 29% with global and inferior wall hypokinesis.   These results were reviewed with Dr. Andee Lineman who recommended an outpatient 2-  D echocardiogram.  The patient had a prior history of normal, and in fact,  hyperdynamic left ventricular function by previous catheterization in 2/03.   Medication adjustments this admission:  Addition of Imdur 60 mg daily to  previous home medication regimen.   The patient was treated with supplemental potassium prior to discharge.   DISCHARGE MEDICATIONS:  1. Imdur 60 mg daily (new).  2. Aspirin 81 mg daily.  3. Aciphex 20 mg daily.  4. Glyburide 2.5 mg b.i.d.  5. Norvasc 10 mg daily.  6. Zocor 20 mg q.h.s.  7. Diovan 320 mg daily.  8. Toprol XL 100 mg b.i.d.  9. Lasix 40 mg b.i.d.  10.      Foltx one tablet daily.  11.      Nortriptyline as previous directed.   FOLLOWUP:  The patient is scheduled to follow up with Gene Serpe, P.A.-C.,  on Wednesday, 07/12/02 at 3 p.m.  Arrangements will be made for a 2-D  echocardiogram to be done prior to the office visit.   DISCHARGE DIAGNOSES:  1. Non-ischemic chest pain.     a. Normal serial cardiac markers.     b. Non-ischemic adequate exercise stress Cardiolite.  2. Single vessel coronary artery disease.     a. Previous catheterization in 2/03:  50% proximal right coronary artery;        80% long triple RV branch- cardiac catheterization 2/03.     b. Hyperdynamic left ventricular function.  3. Hypertension.  4. Chronic renal insufficiency.  5. Type 2 diabetes mellitus.  6. Dyslipidemia.  7. Hypokalemia.  8. Obesity.       Gene Serpe, P.A. [facility]                      Learta Codding, M.D. [facility]    GS/MEDQ  D:  06/28/2002  T:  06/28/2002  Job:  [id number]   cc:    Stacie Glaze, M.D. [facility]

## 2010-10-10 NOTE — Discharge Summary (Signed)
[facility]  Patient:    [name], [name] Visit Number: [id number] MRN: [id number]          Service Type: Attending:  Doylene Canning. Ladona Ridgel, M.D. [facility] Dictated by:   Delton See, P.A. Adm. Date:  07/13/01 Disc. Date: 07/15/01   CC:         Stacie Glaze, M.D. [facility]   Discharge Summary  BRIEF HISTORY:  This is a 60 year old male who was admitted to [facility] July 13, 2001, after he presented for evaluation of chest pain. The patient has a history of coronary artery disease as well as hypertension. He had a catheterization in November 2002 which showed high-grade nondominant RCA disease which was treated medically.  His left ventricular function was supranormal.  Since then, the patient had a exertional chest pain symptoms. He was admitted by Dr. Ladona Ridgel for further evaluation.  PAST MEDICAL HISTORY:  Significant for coronary artery disease, hypertension, diabetes mellitus, chronic renal insufficiency, and history of hyperlipidemia.  SOCIAL HISTORY:  The patient is married.  He denies tobacco or alcohol use. He works for [employer] in [place].  FAMILY HISTORY:  The patients father died from renal and coronary artery disease at age 27.  His mother is alive and well.  He has two brothers, one of whom died from complication of kidney dysfunction and heart disease.  He had one sister who died from a massive MI at age 68.  HOSPITAL COURSE:  As noted, this patient was admitted for further evaluation of chest pain.  He underwent cardiac catheterization on July 14, 2001, performed by Dr. Antoine Poche.  The LAD had a 30% mid stenosis.  The first diagonal had luminal irregularities.  The circumflex had irregularities  The first obtuse marginal was a large vessel and had a 40% lesion.  The RCA was nondominant and had a proximal 50% lesion.  The large RV branch had a long 80% lesion.  The ejection fraction was estimated to be 65%.  Dr.  Lindaann Slough impression was that the patient had single-vessel coronary artery disease in an nondominant RCA.  He recommended continued medical management.  The patient was discharged on July 15, 2001, in improved condition following adjustments of his medications.  LABORATORY DATA:  An EKG showed normal sinus rhythm with left atrial enlargement with ST-T wave abnormalities consistent with lateral ischemia.  A CBC was within normal limits.  Chemistries were unremarkable except for a glucose of 136.  On admission, creatinine was 1.8; this improved to 1.6. Cardiac enzymes were negative.  DISCHARGE MEDICATIONS: 1. The patients Toprol was increased to 100 mg a.m., 50 mg p.m. He was told to continue his other home medications as previously taken.  These included: 2. Norvasc 10 mg daily. 3. Zocor 20 mg daily. 4. Glyburide 5 mg b.i.d. 5. Aspirin daily. 6. Aciphex daily.  FOLLOWUP:  The patient was to follow up with Dr. Daleen Squibb on July 26, 2001.  PROBLEM LIST ON DISCHARGE: 1. Chest pain, myocardial infarction ruled out. 2. Cardiac catheterization performed this admission.  Please see results    as noted above.  Continued medical therapy recommended. 3. History of hypertension. 4. History of diabetes mellitus. 5. Chronic renal insufficiency. 6. History of hyperlipidemia. Dictated by:   Delton See, P.A. Attending:  Doylene Canning. Ladona Ridgel, M.D. [facility] DD:  09/06/01 TD:  09/06/01 Job: [id number]

## 2010-10-10 NOTE — Op Note (Signed)
NAME:  [name], [name] NO.:  [id number]   MEDICAL RECORD NO.:  [id number]                   PATIENT TYPE:  AMB   LOCATION:  DAY                                  FACILITY:  [facility]   PHYSICIAN:  Anselm Pancoast. Zachery Dakins, M.D.          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   POSTOPERATIVE DIAGNOSIS:  Chronic anal pain status post perirectal abscess.   OPERATION:  Examination under anesthesia with anal ultrasound and internal  sphincterotomy, general anesthesia, lithotomy position.   HISTORY:  [name] is a 60 year old, very heavy male who I first met  approximately 6-8 months ago when he had a large perirectal abscess and this  was drained in the operating room here at [facility].  At that  time, that demonstrated a large superficial abscess and a superficial  posterior fissure and I did I&D on the area and did a partial internal  sphincterotomy.  The patient appeared to be doing well and has been followed  but recently, he has had recurrent episodes of pain.  I saw him on one  occasion probably 2-3 months ago and he was having marked sphincter spasm  connected to the healing area of fluctuation and I sent him over to have a  rectal CT to see if we could demonstrate a perirectal abscess and there was  no abscess noted.  I saw him back in the office more recently and could feel  an area posterior that felt more like a fissure than an actual abscess and  he was, again, having episodes of pain but said he had had no purulent  bleeding or drainage.  I recommended that we examine him under anesthesia  and he is here for the planned procedure.  The patient is on Plavix because  of coronary artery stents and this has been held for approximately five  days.   DESCRIPTION OF PROCEDURE:   The patient was taken to the operating suite and  given a dose of antibiotics preoperatively and positioned on the OR table  and general anesthesia with an LOA tube.  I then placed him up in a  lithotomy position with yellow thin stirrups.  With an anoscope, you could  see the obvious posterior fissure where the old abscess had had I&D with the  sphincter muscle fibers exposed.  At this point, I took the anal ultrasound  and inserted it into the patient and then looked ands the only thing that we  could see is this little defect that I think is actually the fissure.  As  far as the orientation with the patient being in the lithotomy position  instead of left lateral position, trying to be sure that the area that we  were seeing was the  actual little gap in the skin and not an actual abscess,  we were trying to convince ourselves.  I then put a little hydrogen peroxide  with a blunt angiocath in the little area and then rescoped him and then I  could see the peroxide in the area but could not see actually in the other  deep abscess around the anus.  Then, with the bullet retractor and under  direct vision looking at all quadrants and palpation, etc., I could not see  anything else.  With this, I used some Betadine and used a cannula to go  under the sphincter and this is probably still the internal sphincter and  divided it with cautery.  I did not put any anesthetic agents in the area  but terminated the procedure at that point.  The patient is now in the  recovery room and states his  anal pain appears to have been subsided and, of course, I have done nothing  except examine him and the internal sphincter so, hopefully, this is a  recurrent posterior anal fissure as the source of his pain and not that of  an actual abscess.  I will follow him in the office and he will continue on  the sitz baths and tube soaks and stool softeners.      WJW/MEDQ  D:  02/11/2004  T:  02/11/2004  Job:   [id number]

## 2010-10-10 NOTE — Discharge Summary (Signed)
NAMEJERELD, [name] NO.:  [id number]   MEDICAL RECORD NO.:  [id number]          PATIENT TYPE:  INP   LOCATION:  2040                         FACILITY:  [facility]   PHYSICIAN:  [name] Dictator       DATE OF BIRTH:  [birth year]   DATE OF ADMISSION:  07/12/2004  DATE OF DISCHARGE:  07/16/2004                                 DISCHARGE SUMMARY   CARDIOLOGIST:  [name], M.D.   PRIMARY CARE PHYSICIAN:  Dr. Lovell [name].   DISCHARGING DIAGNOSES:  1.  Hypertensive urgency, improved.  2.  Renal insufficiency.  3.  Diabetes.  4.  Hypercholesterolemia.   PAST MEDICAL HISTORY:  1.  Coronary artery disease.  Last catheterized in July of 2005 where he was      found to have a 90% RV branch, a 40% D1 lesion and a 70% OM1 lesion,      which was felt not to be amendable to percutaneous revascularization.  2.  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  3.  Hyperlipidemia.  4.  Hypertension.  5.  Diabetes.  6.  Chronic renal insufficiency with a baseline creatinine between 1.7-1.9.   PROCEDURES THIS ADMISSION:  1.  2-D echocardiogram on July 15, 2004, showing an EF of 50-55%.  Study      inadequate for further evaluation of left ventricular regional [name]      motion.  Left ventricular [name] thickness was moderately to markedly      increased.  2.  Myoview stress test on July 13, 2004, with EF calculated at 32%.  No      evidence of myocardial ischemia.  Please note that echocardiogram was      done after the stress test was done with improved ejection fraction.   DISPOSITION:  The patient is being discharged home.   DISCHARGE MEDICATIONS:  He is to take his Lasix 80 mg p.o. b.i.d.  Otherwise  his medications will continue as prior to this admission, including:  1.  Amaryl 2 mg p.o. b.i.d.  2.  Aspirin 81 mg daily.  3.  Celexa daily.  4.  Vitamin B injections monthly.  5.  Imdur 60 mg  daily.  6.  Plavix 75 mg daily.  7.  Metoprolol 100 mg b.i.d.  8.  Diovan 160 mg daily.  9.  Clonidine 0.2 mg b.i.d.  10. Protonix 20 mg b.i.d.  11. Vytorin 10 mg daily.  12. Zetia 20 mg daily.  13. Simvastatin one tablet daily.  14. Folic acid 2.5 mg b.i.d.  15. Lunesta p.r.n.  16. Darvocet p.r.n.  17. Diltiazem 360 mg daily.  18. Lyrica 75 mg b.i.d. for peripheral neuropathy.   ACTIVITY:  Pain management as previously.   DIET:  As previously.   BLOOD WORK:  He will have a BMET drawn on Monday.   FOLLOWUP:  Followup appointment with Dr. Daleen [name] on July 25, 2004, at 11:30  a.m.  He will follow up with Dr. Lovell [name] for his primary care issues.  HISTORY OF PRESENT ILLNESS:  This 60 year old gentleman with a history of  nonobstructive coronary artery disease presented to the emergency room  complaining of angina and presyncope.  The patient stated that he had had  pain on and off the last few weeks with increased stress at work.  He notes  that his pain worsened on the morning and he took a nitroglycerin with some  relief.  Then later in the day developed more significant chest pain,  prompting him to check his blood pressure, which was noted to be 200/100.  The patient presented to the emergency room.  The initial EKG showed a  normal sinus rhythm with a rate of 77.  He had some fairly large impressive  T-wave inversions which are old.  Blood pressure initially 160-200/100-114  diastolic in the emergency room.  The patient was admitted.   HOSPITAL COURSE:  Cardiac enzymes cycled negative enzymes.  BP urgency  treated with medication.  The patient also noted a significant weight gain  and was treated with IV diuretics, clonidine being increased.  Cardiolite  stress test results as stated.  Echocardiogram results as stated.  On the  day of discharge, the patient was much improved.  Denied any chest pain or  shortness of breath.  Blood pressure 122/76.  Weight 285.  Laboratory work   with hemoglobin 14, hematocrit 41, potassium 3.8, BUN 16 and creatinine 2.0.  The patient is being discharged home with medications as stated above and  followup with Dr. Daleen [name]      MB/MEDQ  D:  07/16/2004  T:  07/16/2004  Job:  [id number]   cc:   [name], M.D.   Dr. Lovell [name]

## 2010-10-10 NOTE — Cardiovascular Report (Signed)
NAME:  [name], [name] NO.:  [id number]   MEDICAL RECORD NO.:  [id number]                   PATIENT TYPE:  OIB   LOCATION:  2899                                 FACILITY:  [facility]   PHYSICIAN:  Charlies Constable, M.D. [facility]              DATE OF BIRTH:  Aug 19, 1950   DATE OF PROCEDURE:  11/23/2003  DATE OF DISCHARGE:  11/23/2003                              CARDIAC CATHETERIZATION   CLINICAL HISTORY:  Mr. Mchan is 60 years old and has previously documented,  mostly nonobstructive coronary artery disease. He also has hypertension,  obesity, and marked LVH on ventriculography.  He has been having chest pain  recently and had a Cardiolite scan in May which was negative. Because of the  persistent symptoms he was brought in for evaluation angiography.  He has  also had an MRA to evaluate him for renal artery stenosis which was  negative.   CARDIOLOGIST:  Charlies Constable, M.D.   PROCEDURE:  The procedure was performed via the right femoral artery  utilizing an arterial sheath and 6-French preformed coronary catheters. A  front wall puncture was performed, and Omnipaque contrast was used. The  patient tolerated the procedure well, and left the laboratory in  satisfactory condition. The __________ were closed at the end of the  procedure.   RESULTS:  1. The aortic pressure was 157/101 with a mean of 126.  2. Left ventricular pressure 157/41.   LEFT MAIN CORONARY ARTERY:  The left main coronary artery was free of  significant disease.   LEFT ANTERIOR DESCENDING ARTERY:  The left anterior descending artery gave  rise to a diagonal branch, 2 sets of perforators and a second smaller  diagonal branch.  There was 40% narrowing in the first diagonal branch.   CIRCUMFLEX ARTERY: The circumflex artery gave rise to a ramus branch a  posterolateral branch, and a posterior descending branch.  There was 70%  narrowing of the ramus branch.   RIGHT CORONARY ARTERY: The right  coronary artery is a small, nondominant,  vessel that gave rise to 2 right ventricular branches, 1 of which had a 90%  stenosis.   LEFT VENTRICULOGRAM:  The left ventriculogram performed in the RAO  projection showed vigorous wall motion with marked left ventricular  hypertrophy and an estimated ejection fraction of 60-70%.   CONCLUSIONS:  1. Nonobstructive coronary artery disease, but 40% narrowing of the diagonal     branch to the LAD, 70% narrowing in the ramus branches is circulatory,     and 90% stenosis in a small right ventricular branch of a nondominant     right coronary artery.  2. Marked left ventricular hypertrophy most probably related to hypertensive     cardiovascular disease with an elevated left ventricular pressure of 41.   RECOMMENDATIONS:  The patient has mainly nonobstructive coronary disease;  and, I think, this can be managed medically and it is not  likely that he  __________ before symptoms.  He does have marked LVH and this may cause him  symptoms of exertional dyspnea.                                               Charlies Constable, M.D. [facility]    BB/MEDQ  D:  11/23/2003  T:  11/25/2003  Job:  [id number]   cc:   Thomas C. Wall, M.D.   Stacie Glaze, M.D. [facility]   Charlies Constable, M.D. [facility]   Cardiac Cath Lab

## 2010-10-10 NOTE — Procedures (Signed)
NAMEALBERTA, CAIRNS NO.:  [id number]   MEDICAL RECORD NO.:  [id number]          PATIENT TYPE:  OUT   LOCATION:  SLEEP CENTER                 FACILITY[facility]  [facility]   PHYSICIAN:  Marcelyn Bruins, M.D. [facility] DATE OF BIRTH:  [birth year]   DATE OF STUDY:  06/16/2004                              NOCTURNAL POLYSOMNOGRAM   REFERRING PHYSICIAN:  Dr. Darryll Capers   DATE OF STUDY:  June 16, 2004.   INDICATION FOR THE STUDY:  Known obstructive sleep apnea with hypersomnia.  The patient returns for re-titration. Epworth score is 10.   SLEEP ARCHITECTURE:  The patient had total sleep time 316 minutes with sleep  efficiency of 79%. He had adequate REM but never achieved slow wave sleep.  Sleep onset latency was normal and REM latency was somewhat shortened.   IMPRESSION:  1.  Good control of previously diagnosed obstructive sleep apnea with a CPAP      pressure of 19-22 cm. The patient required this amount of pressure not      only to control obstructive events but also snoring. He used an Theatre manager during the study.  2.  Rare premature atrial contractions and premature ventricular      contractions.  3.  Moderate numbers of leg jerks with very little sleep disruption.      KC/MEDQ  D:  06/18/2004 12:31:23  T:  06/18/2004 13:48:16  Job:  [id number]

## 2010-10-10 NOTE — Cardiovascular Report (Signed)
. [facility]  Patient:    [name], [name] Visit Number: [id number] MRN: [id number]          Service Type: MED Location: [id number] Attending Physician:  Ron Parker Dictated by:   Rollene Rotunda, M.D. [facility] Proc. Date: 07/14/01 Admit Date:  04/01/2001 Discharge Date: 04/03/2001   CC:         Stacie Glaze, M.D. [facility]  Maisie Fus C. Wall, M.D. [facility]   Cardiac Catheterization  DATE OF BIRTH: [birth year]  PRIMARY PHYSICIAN: Stacie Glaze, M.D.  CARDIOLOGIST: Jesse Sans. Wall, M.D.  PROCEDURE: Left heart cardiac catheterization/coronary arteriography.  INDICATIONS: Evaluate patient with chest pain and known coronary disease.  DESCRIPTION OF PROCEDURE: Left heart catheterization was performed via the right femoral artery.  The artery was cannulated using an anterior wall puncture.  A #6 French arterial sheath was inserted via the modified Seldinger technique.  Preformed Judkins and a pigtail catheter were utilized.  The patient tolerated the procedure well and left the lab in stable condition.  RESULTS:  HEMODYNAMICS: LV 159/38, AO 159/96.  CORONARY ARTERIOGRAPHY: Left main: The left main coronary artery was normal.  Left anterior descending: The LAD had 30% mid stenosis. The first diagonal had luminal irregularities.  Circumflex: The circumflex in AV groove had luminal irregularities. There was a large first obtuse marginal with a long 40% stenosis.  Right coronary artery: The right coronary artery was nondominant. It had proximal 50% stenosis. There was a large RV branch with a long 80% stenosis.  LEFT VENTRICULOGRAM: The left ventriculogram was obtained in the RAO projection. The EF was 65% with normal to hyperdynamic wall motion.  CONCLUSION: Single-vessel coronary artery disease in a nondominant right coronary. This is not changed from his previous catheterization in November 2002.  PLAN: At this point, we should continue  medical management. We could consider adding long-acting nitrate to his medical regimen. Dictated by:   Rollene Rotunda, M.D. [facility] Attending Physician:  Ron Parker DD:  07/14/01 TD:  07/15/01 Job: [id number]

## 2010-10-10 NOTE — Consult Note (Signed)
[place]  Patient:    [name], [name]                        MRN: [id number] Proc. Date: 07/02/00 Adm. Date:  [id number] Disc. Date: [id number] Attending:  Shelba Flake CC:         Veneda Melter, M.D. [facility]  Stacie Glaze, M.D. [facility]   Consultation Report  DATE OF BIRTH:  Sep 22, 1950  REASON FOR CONSULTATION:  Abdominal pain and left-sided chest pain.  ASSESSMENT:  1. Epigastric and periumbilical pain with an essentially normal gallbladder     ultrasound, rule out biliary dyskinesia versus peptic ulcer disease.  2. Chest pain with no evidence of ischemia, discussed with Dietrich Pates, M.D.     (that is somewhat confusing and is to be reevaluated by Dr. Jens Som in     the a.m.).  3. Hypertension for the last 30 years.  4. History of adult onset diabetes mellitus diagnosed earlier this year.  5. Renal insufficiency with creatinine of 2 on admission.  6. History of recent bronchitis treated with Z-Pak.  7. History of aspirin and Codeine intolerance.  8. History of occasional bright red blood per rectum secondary to     hemorrhoids.  9. 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 10. History of polyp removed several years ago from the colon. 11. History of multiple catheterizations done in the past, one of which at     [facility] and one at [facility]. 12. 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  RECOMMENDATIONS: 1. Will plan to do a Hiatus scan with half and half to calculate the    gallbladder ejection fraction once acute cardiac issues have been sorted    out. 2. If the hiatus is unrevealing, a CT scan of the abdomen and pelvis will    be required considering the patients creatinine normalizes. 3. I would continue Protonix and would change IV to p.o. Protonix. 4. EGD if hiatus scan and CT scan are unrevealing.  DISCUSSION:  [name] is a very pleasant,  60 year old African-American male who has had a previous history of chest pain, longstanding history of hypertension, recently diagnosed diabetes, who presented to [facility] emergency room on June 30, 2000, with chest pain associated with shortness of breath, diaphoresis, nausea, and weakness especially in the lower extremities.  His acute symptoms started about two weeks ago when he started experiencing nausea postprandially associated with abdominal discomfort and weakness.  He has also had several bouts of vomiting without coffeeground emesis or hematemesis.  He denies any melenic stool.  He has noted some blood and mucous in the stool which he attributes to his hemorrhoids.  His symptoms got progressively worse until June 27, 2000, when he experienced some chest discomfort and saw Dr. Ardeth Perfect in consultation (and because Dr. Lovell Sheehan, his primary care Younis Mathey, was not available).  The patient was diagnosed with acute bronchitis and started on a Z-Pak.  He subsequently took two to three Excedrins per day for three days starting February 4, to June 30, 2000, for fever.  He denies previous history of ulcer, jaundice, or colitis.  He does have a history of hepatitis, but cannot tell me the details on that.  This was several years ago. He denies history of any genitourinary complaints at this time.  He has no history of headaches.  He has occasional  dizziness and has had what he describes as two near syncopal events on June 30, 2000, prompting him to come to the hospital.  There were no acute ENT, dental, or ocular complaints. He denies any vascular or allergic problems except for an intolerance to aspirin and Codeine.  ALLERGIES:  GI upset with ASPIRIN.  Known allergy to CODEINE.  MEDICATIONS: (At home). 1. Norvasc. 2. Toprol. 3. Glucophage.  MEDICATIONS:  (In the hospital). 1. Heparin. 2. Protonix. 3. Ambien. 4. Nitroglycerin. 5. Zithromax. 6.  Prinivil. 7. Lasix. 8. Norvasc. 9. Toprol.  PAST MEDICAL HISTORY:  See list above.  SOCIAL HISTORY:  The patient is married and lives with his [place].  He is employed by [employer] as a Naval architect.  He has three children.  He denies history of alcohol, tobacco, or street drugs.  FAMILY HISTORY:  The patients father died at 78 of heart disease and renal failure.  The patients mother is in her 42s and is healthy.  He has two siblings who died in their late 34s of heart disease and renal failure as well. He has a sister who has had an aortic aneurysm repaired recently and a brother who suffers from diabetes and renal problems as well.  There is no known family history of cancer.  REVIEW OF SYSTEMS:  Chest pain with left upper extremity discomfort and neck pain on the left side.  Epigastric and periumbilical pain with nausea and vomiting.  Diarrhea off and on for last 10 days, alternating with some constipation.  History of occasional bright red blood per rectum, question hemorrhoids.  PHYSICAL EXAMINATION:  GENERAL:  Middle-aged African-American male lying comfortably in bed in no acute distress, wearing glasses.  VITAL SIGNS:  Temperature 98.7, blood pressure 130/80, pulse 78, respiratory rate 20 per minute.  HEENT:  PERRLA.  EOMI.  Oral mucosa without exudate.  NECK:  Supple.  No JVD, lymphadenopathy.  CHEST:  Clear to auscultation. No rales, rhonchi, or wheezing.  HEART:  S1 and S2 regular with no murmur.  ABDOMEN:  Soft with epigastric, periumbilical, right upper quadrant tenderness on palpation with guarding.  No rebound or rigidity.  No hepatosplenomegaly. No masses palpable.  RECTAL:  Refused by the patient as he states he had already had three such examinations on admission, however, no such documentation is available on the chart.  LABORATORY DATA:  White count 4.1 on admission with hemoglobin of 18.2,  hematocrit 54.5,  platelet count 203.  Today hemoglobin 15.4 with MCV 79.8, platelets 198,000.  Sodium from July 01, 2000, was 137, potassium 3.9, chloride 104, CO2 27, glucose 131, BUN 19, creatinine 2, total bilirubin 1.3, alkaline phosphatase 56, AST 34, ALT 24, total protein 5.9, albumin 2.7, calcium 8.5, amylase 47 and lipase 25.  CK-MB on admission, total CK 262 down to 152 today, CK-MB was 1 today and was 1.5 on admission.  Cholesterol was 178 mg/dl with triglycerides of 629, HDL 36, and LDL 107.  Considering this data and the fact that the gallbladder ultrasound was normal, recommendations have been made.  The patient is to be seen by Hedwig Morton. Juanda Chance, M.D. in crosscoverage over the weekend.  She will make further recommendations on my behalf.  I will continue to follow the patient with cardiology and internal medicine service and make further recommendations as need arises. DD:  07/02/00 TD:  07/04/00 Job: [id number]

## 2010-10-10 NOTE — Procedures (Signed)
NAME:  [name], [name] NO.:  [id number]   MEDICAL RECORD NO.:  [id number]          PATIENT TYPE:  OUT   LOCATION:  SLEEP CENTER                 FACILITY:  [facility]   PHYSICIAN:  Marcelyn Bruins, M.D. [facility] DATE OF BIRTH:  1950/12/07   DATE OF ADMISSION:  01/03/2004  DATE OF DISCHARGE:  01/03/2004                              NOCTURNAL POLYSOMNOGRAM   REFERRING PHYSICIAN:  Stacie Glaze, M.D.   INDICATION FOR THE STUDY:  Hypersomnia with sleep apnea.   SLEEP ARCHITECTURE:  The patient had a total sleep time of only 265 minutes.  There was paucity of REM and no slow wave sleep.  The patient had a very  rapid sleep onset at 2 minutes but had very prolong REM onset at 294  minutes.  The sleep was quite fragmented.   IMPRESSION:  1. Slit night study reveals moderate to severe obstructive sleep apnea with     81 obstructive events in the first 142 minutes of sleep.  This gave the     patient respiratory disturbance index extrapolated over that time period     to 34 per hour.  There was mild oxygen desaturation to 87%.  The events     were not positional, nor were they primarily REM related.  Severe snoring     was noted prior to the initiation of CPAP.  By split night protocol, the     patient was then placed on a large Respironics full face mask, and the     pressure was titrated to a final level of 16 cm with good control of     obstructive events and snoring, even through REM.  2. No clinically significant cardiac arrhythmias.  3. The patient had significant back pain throughout the study which     contributed to sleep disruption.                                   ______________________________                                Marcelyn Bruins, M.D. [facility]     KC/MEDQ  D:  01/15/2004 11:57:59  T:  01/16/2004 12:53:07  Job:  [id number]   cc:   Stacie Glaze, M.D. [facility]

## 2010-10-10 NOTE — H&P (Signed)
[name], [name] NO.:  [id number]   MEDICAL RECORD NO.:  [id number]          PATIENT TYPE:  INP   LOCATION:  2040                         FACILITY:  [facility]   PHYSICIAN:  Anna Genre. Maisie Fus, M.D. [facility] OF BIRTH:  04-Aug-1950   DATE OF ADMISSION:  07/12/2004  DATE OF DISCHARGE:                                HISTORY & PHYSICAL   PRIMARY CARDIOLOGIST:  Dr. Fuller Song.   PRIMARY MEDICAL DOCTOR:  Dr. Darryll Capers.   CHIEF COMPLAINT:  Angina/presyncope.   HISTORY OF PRESENT ILLNESS:  This is a 60 year old gentleman with a history  of nonobstructive coronary disease. Last catheterization in July of 2005,  where he was found to have a 90% RV branch, a 40% D1 lesion in his LAD, and  a 70% OM-1 lesion which was felt not to be amenable to percutaneous  revascularization.  He says his catheter in July he had done reasonably well  until recently in October when he was diagnosed with significant peripheral  neuropathy for which he has taken multiple medications.  His neuropathy was  thought to be secondary to degenerative disk disease as well as his  diabetes.  He notes that over the last few weeks particularly, he has had  more associated chest pain, mostly with exertion. 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  He notes that his  pain worsened this morning and took a nitroglycerin with relief and later on  in the day developed more significant chest pain, prompting him to check his  blood pressure at home. His wife who is an R. N. took his blood pressure and  noted it to be in the 200/100's and advised him to report to emergency room.   PAST MEDICAL HISTORY:  1.  Notable for coronary artery disease. He had a catheterization which was      described previously.  2.  History of  hyperlipidemia.  3.  Hypertension.  4.  Diabetes mellitus. He had a functional study in May of 2005 which was      reportedly negative.  5.  History of chronic renal insufficiency with a baseline creatinine that      ranged between 1.7 and 1.9.   MEDICATIONS:  1.  Amaryl 2 mg b.i.d.  2.  Lasix 40 mg b.i.d.  3.  Aspirin 81 daily.  4.  Celexa 20 mg daily.  5.  Vitamin B12 injections on a monthly basis.  6.  Imdur 60 mg daily.  7.  Plavix 75 mg daily.  8.  Metoprolol 100 mg b.i.d.  9.  Diovan 160 mg daily.  10. Clonidine 0.2 mg b.i.d.  11. Protonix 20 mg b.i.d.  12. Pamine forte 5 mg b.i.d.  13. Vytorin 10 mg.  14. Zetia 20 mg.  15. Simvastatin 1 tablet daily.  16. Folic acid 2.5 mg b.i.d.  17. Lunesta 2 mg p.r.n.  18. Darvocet 1 tablet q.4h for  back pain.  19. Diltiazem 360 mg daily.  20. Lyrica 75 mg b.i.d. for peripheral neuropathy.   SOCIAL HISTORY:  The patient lives in [place] with his wife. 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   FAMILY HISTORY:  Significant for coronary artery disease in her mother and  his father both of whom are deceased. Significant coronary disease in his  siblings. Two brothers and two sisters are deceased as well.   REVIEW OF SYSTEMS:  As per his cardiac HPI. He also has had some weakness  and numbness from a neuro psyche stand-point and has some neuropathy. He  also had an episode today which sounds like presyncope. The remainder of his  review of systems is negative. He is a full code.   PHYSICAL EXAMINATION:  VITAL SIGNS:  Pulse is in the 70's, respiratory rate  18, blood pressure has ranged from 160 to 200 over 100 to 114 diastolic in  the emergency department. Respiratory rate is 18.  GENERAL:  He is obese, not in apparent distress.  HEENT EXAM:  Within normal limits.  NECK:  Supple, there was no appreciable thyromegaly although difficult to  assess secondary to his neck size. His  JVP could be assessed. He had no  audible bruits. His pulse is 2+ and symmetric bilaterally with a normal  upstroke.  CARDIOVASCULAR EXAM:  Revealed a normal S1, S2, he did not have any audible  murmurs, rubs, or gallops.  His PMI appeared intact.  LUNGS:  Clear to auscultation bilaterally.  SKIN EXAM:  Without any significant abnormalities.  ABDOMEN:  Was soft, nontender, obese, no appreciable organomegaly.  GU/RECTAL:  Exams were not performed.  EXTREMITIES:  Showed 2+ edema, his peripheral pulses are 2+ and symmetric  bilaterally.  NEUROLOGIC:  He does have decreased sensation bilaterally in the lower  extremities and decreased proprioception on the right greater then left. The  remainder of his neurologic exam was intact.   EKG showed a rate of 77, normal sinus rhythm, he has left axis deviation.  His PR, QRS and QTC intervals were within normal limits. He has some lateral  fairly impressive T wave inversions which are old. He does have poor R wave  progression and possibly and old anteroseptal myocardial infarction.   His laboratory values were notable for a hematocrit of 50. His chem-7 is  notable for a creatinine of 1.9 which is close to his baseline.  Hemoglobin  A1C in January was 6.7. His cardiac markers - first set was negative, a  troponin I less than 0.05, MB of 1.1. His last cholesterol noted in January  of 2006: Total cholesterol of 153, triglycerides of 146, no LDL or HDL  reported.   ASSESSMENT/PLAN:  A 60 year old gentleman with history of coronary artery  disease, hypertension, obesity, hyperlipidemia, who presents with angina in  the setting of a hypertensive urgency. It is my opinion that the patient's  symptoms are primarily related to his hypertensive episode rather than true  angina. However he does have a history of coronary artery disease and a strong family history, so cannot rule out ischemia at this time.   Plan will be to monitor his blood pressure  inhouse. He reports compliance  with his home regimen. His blood pressure has been quite episodic here in  the emergency department. He reports at home it mostly runs in the 120's  over 80's. I have asked him to  document a log of that and will adjust his  blood pressure medications as appropriate during this hospitalization.  Currently he is receiving Clonidine 0.2 mg twice daily, Diltiazem 360 mg  daily, Metoprolol 100 mg twice daily, and Lasix 40 mg twice daily.   In the emergency department he had received one dose of Clonidine 0.2 mg  without resolution of his elevated blood pressure and currently is being  treated with Hydralazine 25 mg IV x1.  If his blood pressure continues to be  elevated I would recommend the following:  Switch his Diltiazem to  amlodipine 10 mg daily. Could titrate his Clonidine or add Hydralazine which  may be difficult to take as it is a t.i.d. drug. Also consider adding  Minoxidil to his regimen.  Given the episodic nature of his blood pressure,  would screen for pheochromocytoma with urine metanephrine's as well as a VMA  level. This is likely essential hypertension, however would hate to miss a  pheochromocytoma. The patient likely will need a functional study based on  his anginal symptoms. Will defer to his primary cardiologist to make that  decision.      KLT/MEDQ  D:  07/13/2004  T:  07/13/2004  Job:  [id number]   cc:   Thomas C. Wall, M.D.   Stacie Glaze, M.D. [facility]

## 2010-10-10 NOTE — H&P (Signed)
NAMEJEVEN, Ronald NO.:  [id number]   MEDICAL RECORD NO.:  [id number]                   PATIENT TYPE:  INP   LOCATION:  3301                                 FACILITY:  [facility]   PHYSICIAN:  Learta Codding, M.D. [facility]             DATE OF BIRTH:  07/26/1950   DATE OF ADMISSION:  06/27/2002  DATE OF DISCHARGE:                                HISTORY & PHYSICAL   REFERRING PHYSICIAN:  Dr. Darryll Capers.   CARDIOLOGIST:  Dr. Juanito Doom.   CURRENT COMPLAINTS:  New-onset substernal chest pain in a patient with known  coronary artery disease.   HISTORY OF PRESENT ILLNESS:  The patient is a 60 year old male followed by  Dr. Daleen Squibb with known coronary artery disease.  The patient has had two prior  cardiac catheterizations, most recent one July 13, 2001.  The patient  has multiple risk factors for coronary artery disease including diabetes  mellitus, hypertension, hypercholesterolemia, and a family history of CAD.  The patient was seen today in the office as an add-on due to symptoms of  crescendo angina.  The patient reports that although he is under a lot of  stress, the last three weeks or so he has experienced substernal chest  pressure which has been exertional in nature.  He describes it as a sharp  pain with some radiation to the jaw and the upper extremities and associated  shortness of breath over the last week or so, particularly the last two  days.  He reported chest pain at rest somewhat similar in nature to the pain  he experienced prior to his last cardiac catheterization.  He took up to  three nitroglycerin with relief of his symptoms.  He does not feel that his  symptoms are related to reflux.  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  In the office today he  still continues to complain of some chest pressure, albeit with new change  on his electrocardiogram.  The patient has a very abnormal  baseline EKG due  to left ventricular hypertrophy with marked strain pattern, but in carefully  reviewing the EKG there is no definite change from his prior tracings.   ALLERGIES:  CODEINE.   MEDICATIONS:  1. Aspirin 81 mg a day.  2. Aciphex 20 mg a day.  3. Glyburide 2.5 mg p.o. b.i.d.  4. Norvasc 10 mg a day.  5. Foltx.  6. Zocor 20 mg p.o. q.h.s.  7. Nortriptyline.  8. Diovan 320 mg p.o. daily.  9. Toprol XL 100 mg p.o. b.i.d.  10.      Lasix 40 mg p.o. b.i.d.   PAST MEDICAL HISTORY:  1. CAD treated medically.  Catheterization November 2002, and     catheterization February 2003.  Normal left main coronary artery, 30%     LAD, 40% first obtuse marginal,  50% proximal RCA which is a nondominant     vessel with long 80% stenosis in RV branch, ejection fraction was 65%.  2. History of hypertension.  3. Diabetes mellitus.  4. Hypercholesterolemia.  5. Chronic renal insufficiency at 1.8.  6. History of arthritis.  7. History of rectal bleed secondary to hemorrhoids.   SOCIAL HISTORY:  The patient lives in [place].  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  He is married and a 75-year-old son.  He  denies alcohol use or tobacco use.   FAMILY HISTORY:  Notable for father died at 51 of renal disease and three  brothers who died from cerebral aneurysm and one sister with lung  aneurysm.   REVIEW OF SYSTEMS:  No fever or chills.  No headache or sore throat.  Dyspnea on exertion as outlined above.  No frequency or dysuria.  No myalgia  or arthralgia.  Prior history of reflux symptoms.   PHYSICAL EXAMINATION:  VITAL SIGNS:  Blood pressure 159/94, heart rate is 58  beats per minute, respirations are 16 and unlabored.  GENERAL:  The patient is a 60 year old African-American male in no apparent  distress.  HEENT:  [place]/AT.  PERRLA.  NECK:  Supple.  No carotid upstroke, no carotid bruits.  HEART:  Regular rate and rhythm.  Normal S2 and S2.  No murmurs, rubs,  or  gallops.  LUNGS:  Clear.  SKIN:  No rash or lesions.  ABDOMEN:  Soft and nontender.  GENITOURINARY, RECTAL:  Deferred.  EXTREMITIES:  With 2+ peripheral pulses.  No cyanosis, clubbing, or edema.  NEUROLOGIC:  Patient alert, oriented.  Grossly nonfocal.   LABORATORY DATA:  Chest x-ray is pending.  Will be obtained in the hospital.   EKG:  Normal sinus rhythm.  Heart rate 58 beats per minute.  Left  ventricular hypertrophy.  Marked strain pattern.   Laboratories are pending.   IMPRESSION AND PLAN:  1. Rule out crescendo angina pectoris:  The patient's symptoms are     suspicious for angina, particularly in light of multiple risk factors.     However, symptoms are similar to his prior presentation both in 2003 and     2002, when he had  nonobstructive coronary artery disease.  I do feel     this course of action is to make sure the patient is treated aggressively     medically.  Will admit him to the hospital.  He will be started on     nitrates and heparin.  If he rules out for myocardial infarction and has     negative enzymes, we will obtain __________ /Cardiolite study in the     morning.  However, if enzymes are positive the patient will need to     proceed with repeat cardiac catheterization and should be given adequate     hydration if the latter option is selected.  2. Hypertension, poorly controlled:  Will adjust his medications with the     addition of nitrate.  3. Chronic renal insufficiency:  Baseline creatinine 1.8-2.  4. Diabetes mellitus.  5. Hypercholesterolemia.  6. Obesity.    DISPOSITION:  Will admit the patient to the hospital and rule out myocardial  infarction.  If positive enzymes will proceed with cardiac catheterization  after adequate hydration with the patient at high risk for contracting  __________ nephropathy.  Learta Codding, M.D. [facility]   GED/MEDQ  D:  06/27/2002  T:  06/27/2002  Job:  [id number]    cc:   Stacie Glaze, M.D. [facility]   Jesse Sans. Wall, M.D. [facility]  [address]  [place]  [place] [phone]  Fax: 1

## 2010-10-10 NOTE — Consult Note (Signed)
[name], CZAJKA NO.:  [id number]   MEDICAL RECORD NO.:  [id number]          PATIENT TYPE:  INP   LOCATION:  3107                         FACILITY:  [facility]   PHYSICIAN:  Broadus [name] T. Pickard II, MDDATE OF BIRTH:  1951-01-19   DATE OF CONSULTATION:  09/21/2005  DATE OF DISCHARGE:                                   CONSULTATION   REQUESTING PHYSICIAN:  Jesse Sans. Wall, M.D., cardiology.   REASON FOR CONSULTATION:  Chronic kidney disease and malignant hypertension.   HISTORY OF PRESENT ILLNESS:  Patient is a 60 year old African-American male  with a history of nonobstructive coronary artery disease with end-stage III  chronic kidney disease, who presents to the ED today with increasing  frequency of chest pain.  He describes it as similar to the characteristic  of his normal chest pain but more intense, associated with dyspnea and  radiation to his head, neck, and right arm.  It is related to minimal  exertion.  He also reports pain in his right eye.  Diovan was recently held  secondary to his elevated creatinine.   PAST MEDICAL HISTORY:  1.  Malignant hypertension.  Patient has had an MRA negative in May, 2005      for renal artery stenosis.  He has also had a workup negative for      pheochromocytoma in February, 2006.  2.  Chronic kidney disease with creatinine of 2.2 at baseline, stage III.      Holding Diovan for a creatinine elevated at 3.5 on July 22, 2005.  3.  Hyperlipidemia.  4.  Coronary artery disease.  Patient had a cath in July, 2005 that showed a      90% occlusion of the RCA, 40% occlusion of the D1 branch, and a 70%      occlusion of the OM I branch.  Echo of June 26, 2004 shows an EF of      50-55%, a Myoview of 2006 shows no ischemia with an EF of 32%.  5.  Obstructive sleep apnea, on CPAP.  6.  Diabetes mellitus.  7.  Perirectal cyst.  8.  Secondary hyperparathyroidism.  9.  Left ventricular systolic dysfunction/congestive heart  failure.   MEDICATIONS:  1.  Metoprolol 100 mg p.o. b.i.d.  2.  Torsemide 100 mg p.o. q.a.m., 50 mg p.o. q.p.m.  3.  Catapres 1 patch per week.  4.  Metolazone 2.5 mg p.o. daily.  5.  Minoxidil 10 mg p.o. b.i.d.  6.  __________ 20/40 b.i.d.  7.  Hydralazine 50 mg p.o. t.i.d.  8.  Diovan 160 mg p.o. b.i.d., which is being held.  9.  Diltiazem 360 mg p.o. daily.  10. Ergocalciferol 50,000 units weekly.  11. Plavix 75 mg p.o. q.h.s.  12. Lantus 30 units subcu q.h.s.  13. Vytorin 10/40 1 p.o. daily.  14. Isosorbide 60 mg p.o. b.i.d.  15. Aspirin 81 mg p.o. daily.  16. Protonix 20 mg p.o. b.i.d.  17. Avandia 4 mg p.o. daily.  18. Amaryl 2 mg p.o. b.i.d.  19. Lunesta p.r.n.  20. Citalopram p.r.n.  21. Lyrica  75 mg t.i.d. p.r.n.  22. Fiber-Con daily.  23. Cyanocobalamin 1 mg p.o. daily.  24. Folic acid 2.5 mg p.o. daily.  25. Pyridoxine 50 mg p.o. daily.  26. Morphine 30 mg p.o. b.i.d. p.r.n.  27. Reglan 5 mg p.o. q.a.c. and q.h.s.   ALLERGIES:  CODEINE.   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  He denies  any tobacco, alcohol or drugs.  He is married to his second wife.  He has a  32-year-old son with her and two daughters with his first wife.   FAMILY HISTORY:  Dad had chronic kidney disease and died from that.  Mother  died at age 12.  He had two sisters who are both dead with chronic kidney  disease.  One sister did have a PE.  He also has two brothers who are dead,  who had chronic kidney disease.  One brother died from an aneurysm.   PHYSICAL EXAMINATION:  VITAL SIGNS:  Temperature 97.2, pulse 91, respiratory  rate 20, blood pressure 163/123.  CBG is 194, satting at 93% on room air.  Weight is 118.8 kg.  HEENT:  Atraumatic and normocephalic.  Pupils are equal, round and reactive  to light.  Extraocular movements are intact.  NECK:  No JVD.  No thyromegaly.  No lymphadenopathy.  PULMONARY:  Clear to auscultation bilaterally.  No wheezes, crackles,  or  rales.  CARDIOVASCULAR:  Regular rate and rhythm.  Distant S1 and S2 with no  murmurs, rubs or gallops.  ABDOMEN:  Soft, nontender, nondistended.  Positive bowel sounds.  EXTREMITIES:  No clubbing, cyanosis or edema.  NEUROLOGIC:  Cranial nerves II-XII are grossly intact and moves all four  extremities.   CT of the head shows two lesions in the left cerebellum.  They are possible  remote infarcts.  He has mild atrophy and some chronic white matter disease.   Chest x-ray shows stable cardiomegaly.   LABS:  White count is 4.3, hemoglobin is 16.5, hematocrit is 49.6.  PT is  12.5, INR 0.9, PTT 29.  Sodium 138, potassium 4, chloride 107, bicarb 25,  BUN 17, creatinine 1.8, glucose 190, AST 28, ALT 12, total protein 6.3,  albumin 3.3, calcium 9.3, mag 1.9, CK 135, MB 2.5, index 1.9.  Troponin  0.05.   ASSESSMENT/PLAN:  Patient is a 60 year old African-American male with chest  pain unrelated to exertion, headache, and malignant hypertension.  Problem 1:  Chronic kidney disease, stage III:  He is at his baseline and  actually slightly better off the Diovan.  We will consider resuming the  Diovan since the patient's creatinine is much better, and he is already on  high dose Lotrel.  We will follow his renal panel daily once he restarts the  Diovan.  Problem 2:  Malignant hypertension:  At present, he is on a nitroglycerin  drip.  We are holding isosorbide.  He is on his hydralazine, clonidine,  Minoxidil, Lotrel, Cardizem, Demadex, metoprolol, and metolazone.  We will  resume these meds and watch for decreasing trend and the possible ability to  wean off the nitroglycerin drip as tolerated.  Agree with increasing the  Lopressor, given his heart rate from 100 b.i.d. to 150 b.i.d.  We will also  reconsider restarting the Diovan, just given the fact that his renal  function is so good, he is already taking 40 mg of benazepril twice daily with no problems.  Problem 3:  Chest pain, rule  out.  Per cardiology, likely secondary to  increased after-load strain.  Will follow cardiac enzymes.  Problem 4:  Diabetes mellitus:  On Lantus.  Problem 5:  Secondary hyperparathyroidism:  Will resume his ergocalciferol  in the hospital.  Problem 6:  Headaches:  This is likely secondary to a nitroglycerin.  Will  follow.  Head CT is negative.  He does have a family history of aneurysms  but headache is not classic.  Consider MRA of the head if it worsens.  Also  consider ophthalmology consult if eye pain worsens or returns.  Right now,  he denies any symptoms consistent with acute angle-closure glaucoma.  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  He  is not on any drips for that.  I do doubt acute angle-closure.  This was  likely referred from the chest pain and hypertension and is not complicated  by the nitroglycerin drip.      Broadus [name] T. Pamalee Leyden, MD     WTP/MEDQ  D:  09/23/2005  T:  09/24/2005  Job:  [id number]

## 2010-10-10 NOTE — Cardiovascular Report (Signed)
[place]  Patient:    Ronald, [name] Visit Number: [id number] MRN: [id number]          Service Type: MED Location: [id number] Attending Physician:  Ron Parker Dictated by:   Daisey Must, M.D. [facility] Proc. Date: 04/01/01 Admit Date:  04/01/2001 Discharge Date: 04/03/2001   CC:         Stacie Glaze, M.D. [facility]  Luis Abed, M.D. [facility]  Cardiac Catheterization Lab   Cardiac Catheterization  PROCEDURE:  Left heart catheterization with coronary angiography.  CARDIOLOGIST:  Daisey Must, M.D. [facility]  INDICATION:  [name] is a 60 year old male with diabetes and hypertension. He was admitted to the hospital with recurrent episodes of substernal chest pain.  He has history of known severe left ventricular hypertrophy.  He also has chronic renal insufficiency with creatinine of 2.0.  However, he has had recurrent episodes of substernal chest pain while in the hospital.  An EKG showed anterolateral T wave inversions.  Because of recurrent nature of his chest pain, we opted to proceed with cardiac catheterization.  The patient was pretreated with Mucomyst and intravenous fluids.  PROCEDURE NOTE:  A 6-French sheath was placed in the right femoral artery. Catheters utilized included a 6-French JL4, 6-French JR4, and 6-French AL1 ultimately used to engage the right coronary artery because it had a high anterior takeoff.  Left ventricular pressure was measured with an angled pigtail.  Contrast was Omnipaque.  There were no complications.  CATHETERIZATION RESULTS:  HEMODYNAMICS:  Left ventricular pressure 148/38, aortic pressure 148/100. There was minimal aortic valve gradient on pullback.  Left ventriculography was not performed due to renal insufficiency.  CORONARY ANGIOGRAPHY: (Left dominant).  Left main is normal.  Left anterior descending artery has a 30% stenosis in the mid vessel.  The LAD is a large vessel which curls  the Apex and supplies the distal portion of the inferior septum.  The LAD gives rise to a normal size first diagonal which has a 30% stenosis proximally and a small second diagonal which has a 50% stenosis proximally.  The left circumflex is a large, dominant vessel.  In the very distal vessel is a 30% stenosis prior to the second posterolateral branch.  The circumflex gives rise to a large first obtuse marginal which has a diffuse 60% stenosis. There was a small second obtuse marginal.  The distal circumflex gives rise to a normal size first posterolateral branch, normal size second posterolateral branch, and a small posterior descending artery.  The right coronary artery is a relatively small codominant vessel.  It does give rise to a long but slender right ventricular branch which does appear to give some small terminal branches too the mid portion of the inferior septum. In the proximal to mid portion of the right ventricular branch, there is a diffuse 99% stenosis with TIMI-2 flow beyond this.  Further down in the right ventricular branch are tandem 60% stenoses followed by an 80 to 90% stenosis in the very distal portion of this branch.  IMPRESSIONS: 1. Significantly elevated left ventricular end-diastolic pressure secondary    to left ventricular hypertrophy. 2. Two-vessel coronary artery disease as described.  There is moderate but    nonobstructive disease in the obtuse marginal branch.  There is a diffuse    subtotal occlusion of the right ventricular branch of a nondominant    right coronary artery as described.  Because of the small size of this  vessel and diffuse nature of the disease, it is not amenable to    percutaneous intervention.  PLAN:  The patient will be managed with aggressive medical therapy.  He will be started on low-dose aspirin and long-term Plavix.  He will also be given aggressive anti-anginal therapy. Dictated by:   Daisey Must, M.D.  [facility] Attending Physician:  Ron Parker DD:  04/01/01 TD:  04/03/01 Job: [id number]

## 2010-10-10 NOTE — H&P (Signed)
[name], KULZER NO.:  [id number]   MEDICAL RECORD NO.:  [id number]          PATIENT TYPE:  INP   LOCATION:  1829                         FACILITY:  [facility]   PHYSICIAN:  Jesse Sans. Wall, M.D.   DATE OF BIRTH:  Oct 01, 1950   DATE OF ADMISSION:  09/21/2005  DATE OF DISCHARGE:                                HISTORY & PHYSICAL   PRIMARY CARE PHYSICIAN:  Dr. Lovell Sheehan   PRIMARY CARDIOLOGIST:  Dr. Daleen Squibb   NEPHROLOGIST:  Dr. Eliott Nine   CHIEF COMPLAINT:  Chest pain.   HISTORY OF PRESENT ILLNESS:  [name] is a 60 year old African-American  male with a history of nonobstructive coronary artery disease with medical  therapy recommended by catheterization in 2005.  He states that he has been  having consistent exertional chest pain which is generally relieved by rest  and possibly one sublingual nitroglycerin.  However, 4 days ago he began  experiencing an increase in his usual chest pain as well as a change in the  quality of the pain.  He has had to take 12 nitroglycerin tablets over the  last 4 days and each episode on Saturday and Sunday required three  sublingual nitroglycerin to relieve.  His symptoms are with minimal exertion  and the pain has changed in that it is now starting in his head, radiating  down into his right eye, then down into his chest and down his right arm.  His exertional angina is generally substernal or on the left side of his  chest and radiates down his left arm at its most severe.  [name] states  that the chest pain he has been having over the last 4 days reaches a 9/10  at its worst and associated with shortness of breath.  He has never had head  or eye pain before and never had pain in his right arm before either.  He is  currently symptom free.  He saw his nephrologist, Dr. Dunham, today and  because his systolic was so high and because he was having recurrent  episodes of chest pain he was sent to the emergency room.   PAST MEDICAL  HISTORY:  1.  Status post cardiac catheterization in 2005 with a 40% first diagonal,      70 % OM-1 and a 90% RV branch; medical therapy recommended.  2.  Status post echocardiogram February 2006 with an EF of 50-55%.  3.  Diabetes.  4.  Hypertension.  5.  Hyperlipidemia.  6.  Family history of coronary artery disease.  7.  Chronic renal insufficiency with creatinine recently greater than 3.  8.  Gastroesophageal reflux disease symptoms.  9.  Obstructive sleep apnea on CPAP.  10. Osteoarthritis.  11. Peripheral neuropathy.  12. 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  13. History of rectal bleed secondary to hemorrhoids.   SURGICAL HISTORY:  He has had perirectal abscess and repair as well as  cardiac catheterizations, colonoscopy, and polypectomy.   ALLERGIES:  He has had  GI symptoms secondary to ASPIRIN and is allergic or  intolerant to CODEINE.   CURRENT MEDICATIONS:  1.  Amaryl 4 mg one-half tablet at 6 a.m. and q.a.c. supper  2.  Metoprolol 100 mg one-and-a-half tablets daily at 6 a.m. and at supper.  3.  Torsemide 100 mg q.a.m. and one-half tablet at supper.  4.  Avandia 4 mg at 6 a.m.  5.  Catapres patch 0.4 on Sundays.  6.  Protonix 20 mg b.i.d.  7.  Aspirin 81 mg a day.  8.  Isosorbide 60 mg in a.m. and 5 p.m.  9.  Vytorin 10/40 daily at supper.  10. Pyridoxine 50 mg, folic acid 1 mg and cyanocobalamin 1000 mg are taken      inconsistently  11. Lunesta 2 mg q.h.s. p.r.n.  12. Plavix 75 mg daily at supper time.  13. Darvocet p.r.n.  14. Fibercon two tablets at 6 a.m.  15. Cardizem CD 360 mg daily at suppertime.  16. Lyrica 75 mg t.i.d. p.r.n.  17. Metolazone 2.5 mg at 10 a.m.  18. Lotrel 20/40 at 10 a.m. and daily at suppertime.  19. Celexa p.r.n., rarely taken.  20. Reglan 10 mg q.h.s. only.  21. Phenergan 25 mg b.i.d. p.r.n.  22..  Minoxidil 5 mg one tablet at 6 a.m.,  one tablet 10 a.m., and two  tablets at supper.  1.  Morphine p.r.n.  2.  Hydralazine 50 mg one tablet 6 a.m., one tablet 10 a.m., and one tablet      at supper.  3.  Lantus 30 units q.h.s.   SOCIAL HISTORY:  He lives in [place] with his wife and is not currently  working.  He has no history of alcohol, tobacco or drug abuse.   FAMILY HISTORY:  His mother died at age 65 without a history of heart  disease.  His father died at age 34 with a history of heart disease.  His  siblings are all deceased, four died of aneurysms and one died of a heart  attack.   REVIEW OF SYSTEMS:  He has some vision loss.  He has some numbness that is  episodic to the right side of his face.  He has chronic arthralgias and back  pain.  He takes Phenergan in the morning to keep from vomiting but says that  he generally throws up once or twice a day and feels that it is secondary to  all of his medication.  He has occasional reflux symptoms.  He was not aware  that he was supposed to be taking the Reglan four times a day.  The chest  pain is described above.  His lower extremity edema has resolved; he is very  happy about that.  Review of systems is otherwise negative.   PHYSICAL EXAMINATION:  VITAL SIGNS:  Temperature is 98.3, blood pressure is  224/137 with a heart rate of 98, respiratory rate 22, O2 saturation 98% on  room air.  GENERAL:  He is a well-developed, obese African-American male in no acute  distress.  HEENT:  Head is normocephalic and atraumatic with pupils equal, round,  reactive to light and accommodation.  Extraocular movements intact.  Sclerae  clear.  Nares without discharge.  NECK:  There is no lymphadenopathy, thyromegaly, bruit, or JVD noted.  CARDIOVASCULAR:  His heart is regular in rate and rhythm with S1, S2 and a  soft systolic murmur is noted.  His distal pulses are 2+ and no femoral  bruits are appreciated.  LUNGS:  Clear to auscultation bilaterally.  SKIN:  No rashes or  lesions are noted.  ABDOMEN:  Soft and nontender with active bowel sounds and hepatosplenomegaly  by percussion.  EXTREMITIES:  There is no cyanosis, clubbing or edema.  MUSCULOSKELETAL:  There is no joint deformity or effusions, no spine or CVA  tenderness.  NEUROLOGIC:  He is alert and oriented with cranial nerves II-XII grossly  intact.   Chest x-ray shows stable cardiac enlargement with no acute disease.  EKG:  Sinus rhythm, rate 99, no acute changes.   LABORATORY VALUES:  Hemoglobin 16.5, hematocrit 49.6, wbc's 4.3, platelets  257.  INR 0.9, PTT 29.  Sodium 138, potassium 4.3, chloride 104, CO2 30, BUN  18, creatinine 2.0, glucose 256.  Other CMET values within normal limits  except for an albumin minimally low at 3.3.  Initial CK-MB 135/2.5 with a  troponin of 0.05 and a BNP of 89.   IMPRESSION:  1.  Chest pain:  This is probably subendocardial ischemia.  His CK-MB will      be cycled.  His troponins are likely chronically elevated secondary to      his renal insufficiency.  Will recheck an echocardiogram as well.  At      this time, continued medical therapy for coronary artery disease is his      best option.  Will add IV nitroglycerin and hold his Imdur.  2.  Hypertension:  He will be continued on home medications and Dr. Elza Rafter      group will be consulted for further assistance in managing his pressure.  3.  Episodic facial numbness:  Will check a CT scan without contrast.  Will      add heparin to his medication regimen once his blood pressure is better      controlled.  4.  Obesity:  Will ask nutrition to see regarding heart-healthy diet.  Mr.      Yadav states that he eats between 1800 and 2200 calories per day and is      compliant with a diabetic diet.  Will get the nutritionist to review      this with him.  5.  Compliance:  Mr. Nester states that he is compliant with his medications      but he also says he has had some problems with nausea and vomiting which       he feels is secondary to the medication.  He takes Phenergan on a p.r.n.      basis for this but he was also supposed to be taking Reglan q.a.c. and      h.s. and was not aware of this and has only been taking it q.h.s.  We      will change the Reglan to the prescribed dosage and will follow this.   Mr. Withers is otherwise stable and will be continued on his home  medications.   Dr. Juanito Doom saw the patient and determined the plan of care.      Theodore Demark, P.A. [facility]      Thomas C. Wall, M.D.  Electronically Signed   RB/MEDQ  D:  09/21/2005  T:  09/21/2005  Job:  [id number]

## 2010-10-10 NOTE — H&P (Signed)
NAMEORA, MCNATT NO.:  [id number]   MEDICAL RECORD NO.:  [id number]                   PATIENT TYPE:  INP   LOCATION:  2004                                 FACILITY:  [facility]   PHYSICIAN:  Olga Millers, M.D.                DATE OF BIRTH:  1951-04-05   DATE OF ADMISSION:  12/29/2002  DATE OF DISCHARGE:                                HISTORY & PHYSICAL   HISTORY OF PRESENT ILLNESS:  Mr. Gadson is a 60 year old male, with a  history of noncritical coronary artery disease, followed by Dr. Valera Castle,  who was last hospitalized here in February of this year for chest pain.  Serial cardiac markers were normal, and he underwent a stress Cardiolite  test which showed no evidence of ischemia but depressed LV function.  Since  then, he has enrolled and completed a cardiac rehabilitation program.   The patient now presents with new-onset chest discomfort at rest.  Although  he reports intermittent history of exertional chest discomfort relieved with  rest, this morning he awoke with upper chest discomfort.  This was a new  finding for him.  The chest discomfort was associated with radiation to the  left neck and shoulder region but with no radiation to the upper  extremities.  He did report associated dyspnea but no diaphoresis or nausea.  He took a total of six sublingual nitroglycerin tablets during the course of  the day, all resulting in initial prompt relief.  However, his symptoms were  recurrent and, following consultation with the office, he was directed to  the emergency room.   On presentation the patient noted some mild chest pain which was then  relieved after initiation of intravenous nitroglycerin.  He is currently  pain-free.  His electrocardiogram shows a deep symmetric T-wave inversion  anterolaterally.  This is unchanged since his previous study.   ALLERGIES:  CODEINE.   CURRENT MEDICATIONS:  1. Amaryl 2 mg b.i.d.  2. Metoprolol 100  mg q.a.m., 50 mg q.p.m.  3. Furosemide 40 mg b.i.d.  4. Diovan 320 mg daily.  5. Catapres-TTS 0.2 mg each week.  6. Prilosec 20 mg daily.  7. Aspirin 81 mg daily.  8. Felodipine 5 mg b.i.d.  9. Nitro-Dur 0.2 mg daily.  10.      Lipitor 40 mg daily.  11.      Pyridoxine 15 mg daily.  12.      Folic acid 2.5 mg daily.  13.      Cyanocobalamin 1 mg daily.   PAST MEDICAL HISTORY:  1. Coronary artery disease.     A. Noncritical coronary artery disease/hyperdynamic LV function, cardiac        catheterization February 2003.     B. Nonischemic exercise Cardiolite; EF 29%, February 2004.  2. Hypertension.  3. Type 2 diabetes mellitus.  4. Dyslipidemia.  5. Mild renal insufficiency.  6.  Gastroesophageal reflux disease.  7. History of rectal bleed.     A. Secondary to hemorrhoids.  8. Status post rectal abscess surgery.   REVIEW OF SYSTEMS:  Negative for any recent fever, productive cough, or  chills.  Notes progressive exertional dyspnea over the recent past.  Denies  any orthopnea, PND, or pedal edema.  Denies any recent epistaxis,  hemoptysis, hematochezia, or melena.  Denies any recent symptoms of  heartburn.  Otherwise, as per HPI.  Remaining systems negative.   SOCIAL HISTORY:  Denies tobacco or alcohol use.   FAMILY HISTORY:  Father deceased at age 23, history of renal and coronary  artery disease.  Has three brothers who are deceased, with reported history  of cerebral aneurysm.  One sister deceased, again, with reported history of  an aneurysm.   LABORATORY DATA:  WBC 4.6, HGB 15, HCT 45.4, platelets 226.  Sodium 140,  potassium 4.2, glucose 102, BUN 17, creatinine 1.7.  Total CPK 198.  MB and  troponin I currently pending.   Electrocardiogram:  NSR at 60 bpm with left axis deviation; deep symmetric T-  wave inversion anterolaterally (unchanged since February 2003.   Chest x-ray:  Mild vascular congestion; mild chronic bronchitic changes.   PHYSICAL EXAMINATION:  VITAL  SIGNS:  Blood pressure 173/87, pulse 68,  respirations 20, temperature 99.  SaO2 of 98% (room air).  GENERAL:  The patient is a 60 year old male in no apparent distress.  HEENT:  Normocephalic, atraumatic.  NECK:  There are equal bilateral carotid pulses without bruits.  LUNGS:  Diminished breath sounds, without crackles or wheezes.  HEART:  Regular rate and rhythm (S1, S2).  No murmurs or rubs or gallops.  ABDOMEN:  Protuberant, nontender.  Intact bowel sounds.  EXTREMITIES:  Preserved distal pulses with no significant pedal edema.  NEUROLOGIC:  Alert and oriented.   IMPRESSION:  The patient is a 60 year old male presenting with symptoms  typical/atypical for ischemic etiology, with known history of noncritical  coronary artery disease treated medically, and multiple cardiac risk  factors.    PLAN:  The patient will be admitted, continued on aspirin and beta blocker,  and maintained on intravenous nitroglycerin and heparin.  The plan is to  proceed with an exercise stress Cardiolite in the morning if serial cardiac  markers are normal.  If the patient rules in for myocardial infarction, then  recommendation is to proceed with diagnostic coronary angiogram on Monday.      Gene Serpe, P.A. [facility]                      Olga Millers, M.D.    GS/MEDQ  D:  12/29/2002  T:  12/29/2002  Job:  [id number]   cc:   Stacie Glaze, M.D. [facility]

## 2010-10-10 NOTE — Op Note (Signed)
NAMEZACHARIAH, [name] NO.:  [id number]   MEDICAL RECORD NO.:  [id number]                   PATIENT TYPE:  OUT   LOCATION:  DFTL                                 FACILITY:  [facility]   PHYSICIAN:  Anselm Pancoast. Zachery Dakins, M.D.          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   POSTOPERATIVE DIAGNOSIS:  Anal fistula with abscess.   OPERATION:  1. Internal sphincterotomy.  2. Drainage of abscess, posterior.   SURGEON:  Anselm Pancoast. Zachery Dakins, M.D.   HISTORY:  [name] is a 60 year old, 260 pound, male, whom I saw in the  office yesterday after being referred by Stacie Glaze, M.D. for  hemorrhoids of about six weeks duration.  The patient states that he will  have bleeding off and on.  He had a lot of anal pain and will sometimes have  purulent drainage and on examination, he was in fairly marked sphincter  spasm, but you could see frank purulence coming from an internal opening  within the anal canal.  As far as trying to do a definite anoscopic exam  because of the pain, it was very limited visibility but with the frank  purulence coming from this opening, I recommend that we do an examination  under anesthesia and drainage of the infection and possibly a  sphincterotomy.  The patient is a known cardiac patient, having for the last  month been off work because of angina but had been released to return to  work, and I contacted [employer]. [name], M.D., who stated that yes he knew the  patient well, and he basically was a stable angina patient, and it would be  satisfactory to proceed with surgery if surgery was needed.  The patient has  been NPO after midnight.  I did start him on Augmentin yesterday, and he  comes in this morning.   DESCRIPTION OF PROCEDURE:  He first voided, and then we  gave him 3 g of  Unasyn and positioned on the OR table, induction of general anesthesia via  endotracheal tube.  We put him the [place] stirrups and then with the  patient relaxed, you could see this internal opening posterior that today  does not have the purulence like it was yesterday, and he has some stool in  his rectum.  We did a careful rectal examination, revealing the various  quadrants, etc.  I could not locate any other actual pockets of undrained  infection, and I elected to place a hemostat under the sphincter exteriorly  and then do a sphincterotomy with cautery.  I extended the skin incision to  the outside a little so that now when he has a bowel movement, it will not  have this little pocket that he pushes stool in  that is causing this  infection off and on.  Hemostasis was done with the cautery.  I did not  actually place any chromic sutures.  As he was kind of becoming lighter, I  again reexamined him and could not find any evidence of what I think is an  actually undrained pocket of infection in any of the quadrants and also  watching to make sure that we did not get the purulence like was coming out  yesterday.  I feel no further drainage is necessary, but I will be  rechecking him in the office early next week to make sure there are not  other areas that are not visible because of his large size.  Surgery was  completed.  He was placed back flat on the OR table.  I did place Xylocaine  ointment and a gauze 4 x 4  to kind of minimize the immediate spasm as I had not injected any Xylocaine  in the perianal sphincter, etc.  The surgery completed.  If he is stable in  the recovery room, I think he can be released, continue on his chronic  medications plus Tylox for pain, and I will see him back in the office for a  follow-up in approximately three days.                                               Anselm Pancoast. Zachery Dakins, M.D.    WJW/MEDQ  D:  08/12/2002  T:   08/12/2002  Job:  [id number]   cc:   Stacie Glaze, M.D. [facility]   [name], M.D. [facility]

## 2010-10-10 NOTE — Discharge Summary (Signed)
[place]  Patient:    [name], [name] Visit Number: [id number] MRN: [id number]          Service Type: Attending:  Luis Abed, M.D. [facility] Dictated by:   Delton See, P.A. Adm. Date:  03/31/01 Disc. Date: 04/03/01   CC:         Stacie Glaze, M.D. [facility]   Referring Physician Discharge [place]  BRIEF HISTORY:  [name] is a 60 year old male admitted to [facility] on March 31, 2001 by Dr. Lovell Sheehan for evaluation of chest pain.  He was seen in consultation by Dr. Myrtis Ser and eventually transferred to the cardiology service.  PAST MEDICAL HISTORY:  The patient had a Cardiolite in February 2002 that showed no ischemia, EF 38%, with diaphragmatic attenuation.  He had a 2-D echo around that same time that showed an ejection fraction of 55-65% with severe LVH with near cavity obliteration but no left ventricular outflow tract gradient.  A catheterization was not recommended at that time.  However, apparently the patient was recently seen at [facility] and it was felt that a cardiac catheterization was needed; however, this was not performed.  Other history significant for hypertension, diabetes mellitus, CODEINE intolerance, history of hepatitis, and recurrent chest pain, as well as anxiety.  ALLERGIES:  ASPIRIN and CODEINE.  SOCIAL HISTORY:  The patient lives with his wife in [place].  He does not smoke or use alcohol.  HOSPITAL COURSE:  As noted, this patient was admitted for evaluation of chest pain.  He was seen in consultation by Dr. Myrtis Ser.  A decision was made to proceed with cardiac catheterization on April 01, 2001.  This was performed by Dr. Gerri Spore.  The patient was found to have two-vessel coronary artery disease; however, medical treatment was recommended.  The patients medications were adjusted and arrangements were made for discharge in improved condition on April 03, 2001.  LABORATORY DATA:  A CBC  on November 10 revealed hemoglobin 14.6; hematocrit 44.3; wbcs 4.6; platelets 198,000.  Chemistries on November 9 were unremarkable except for a creatinine of 1.8; the BUN was 16.  Cardiac enzymes were negative.  A cholesterol profile revealed cholesterol 211, triglycerides 301, HDL 46, LDL 105, very low density lipids were elevated at 60.  A CT scan of the chest was negative for pulmonary embolus.  Lower extremity exam was negative for DVT.  DISCHARGE MEDICATIONS:  1. Metoprolol 100 mg b.i.d.  2. Aciphex as previously taken.  3. Glyburide 5 mg as previously taken.  4. Nitroglycerin p.r.n.  5. The patient was taken off lisinopril.  6. He was to continue enteric-coated aspirin 81 mg daily.  7. Plavix 75 mg daily.  8. Norvasc 10 mg daily.  9. Catapres 0.1 mg b.i.d. 10. Imdur 30 mg daily.  ACTIVITY:  As tolerated.  DIET:  Low salt/low fat diet.  WOUND CARE:  He was told to call the office if he had any increased pain, swelling, or bleeding from his groin.  FOLLOW-UP:  He was to follow up with Dr. Daleen Squibb and Dr. Lovell Sheehan.  PROBLEM LIST AT TIME OF DISCHARGE:  1. Cardiac catheterization performed April 01, 2001 revealing two-vessel     coronary artery disease; medical treatment recommended.  There was a     diffuse subtotal occlusion of the right ventricular branch of a     nondominant right coronary artery which was not felt to be amenable     for intervention.  A small second  diagonal off the left anterior     descending artery had a 50% stenosis.  There were several 30% stenoses in     the left anterior descending artery.  The circumflex had an obtuse     marginal that had diffuse 60% stenosis.  2. Hypertension.  3. Diabetes mellitus.  4. Anxiety.  5. History of hepatitis B.  6. History of peptic ulcer disease.  7. 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 Dictated  by:   Delton See, P.A. Attending:  Luis Abed, M.D. [facility] DD:  09/22/01 TD:  09/22/01 Job: [id number]

## 2010-10-14 ENCOUNTER — Ambulatory Visit (INDEPENDENT_AMBULATORY_CARE_PROVIDER_SITE_OTHER): Payer: Medicare Other | Admitting: Internal Medicine

## 2010-10-14 ENCOUNTER — Encounter: Payer: Self-pay | Admitting: Internal Medicine

## 2010-10-14 DIAGNOSIS — R110 Nausea: Secondary | ICD-10-CM

## 2010-10-14 DIAGNOSIS — E119 Type 2 diabetes mellitus without complications: Secondary | ICD-10-CM

## 2010-10-14 DIAGNOSIS — G5790 Unspecified mononeuropathy of unspecified lower limb: Secondary | ICD-10-CM

## 2010-10-14 DIAGNOSIS — E1140 Type 2 diabetes mellitus with diabetic neuropathy, unspecified: Secondary | ICD-10-CM | POA: Insufficient documentation

## 2010-10-14 DIAGNOSIS — M549 Dorsalgia, unspecified: Secondary | ICD-10-CM

## 2010-10-14 DIAGNOSIS — N183 Chronic kidney disease, stage 3 unspecified: Secondary | ICD-10-CM

## 2010-10-14 MED ORDER — PREGABALIN 75 MG PO CAPS: 75.0000 mg | ORAL_CAPSULE | Freq: Two times a day (BID) | ORAL | Status: DC

## 2010-10-14 MED ORDER — LINAGLIPTIN 5 MG PO TABS: 5.0000 mg | ORAL_TABLET | Freq: Every day | ORAL | Status: DC

## 2010-10-14 MED ORDER — ONDANSETRON HCL 4 MG PO TABS: 4.0000 mg | ORAL_TABLET | Freq: Every day | ORAL | Status: DC | PRN

## 2010-10-14 NOTE — Assessment & Plan Note (Signed)
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

## 2010-10-14 NOTE — Progress Notes (Signed)
Subjective:    Patient ID: [name], male    DOB: February 12, 1951, 60 y.o.   MRN: [id number]  HPI is an extremely ill-appearing 60 year old was having trouble with his gait numbness in his feet pain down his legs bilaterally that has worsened dramatically over the past several weeks.  He states he saw his renal doctor and a pull and edema BX was increased. Over the past 2 months we have seen a marked worsening of his diabetes renal failure has worsened a shunt was placed and failed in his right arm.  He denies any fever chills but has persistent nausea and vomiting and states that his by mouth intake has been poor. Review of laboratory data shows marked worsening creatinine marked increase of BUN uncle is his electrolyte abnormalities.  I suspect that pending dialysis as needed    Review of Systems Review of systems was significant for positive findings in all systems he has felt extremely weak and lethargic he has pain radiating down his legs edema in his lower extremities he has had some shortness of breath denies chest pain he has had nausea and vomiting but denies diarrhea he has had urinary frequency and increased thirst he has had some moments of confusion but is oriented to person place and time today    Past Medical History  Diagnosis Date  . Coronary atherosclerosis of native coronary artery   . Other malaise and fatigue   . Chest pain, unspecified   . Other dyspnea and respiratory abnormality   . Unspecified essential hypertension   . Personal history of unspecified circulatory disease   . Dysphagia, unspecified   . Internal hemorrhoids without mention of complication   . Hypertrophy of prostate with urinary obstruction and other lower urinary tract symptoms (LUTS)   . Nausea alone   . Gastroparesis   . Backache, unspecified   . Chronic kidney disease, stage III (moderate)   . Posttraumatic stress disorder   . Hemorrhage of rectum and anus   . Allergy    Past Surgical  History  Procedure Date  . Hemorrhoid surgery   . Anal fissurectomy     reports that he has never smoked. He does not have any smokeless tobacco history on file. He reports that he does not drink alcohol or use illicit drugs. family history includes Diabetes in his maternal aunt, maternal uncle, paternal aunt, paternal uncle, and sister and Heart disease in an unspecified family member. No Known Allergies  Objective:   Physical Exam    BP 110/70  Pulse 72  Temp 98.2 F (36.8 C)  Resp 20  Ht 5\' 10"  (1.778 m)  Wt 254 lb (115.214 kg)  BMI 36.45 kg/m2 On physical examination he is a obese African American male in moderate distress he has central abdominal obesity his vital signs were reviewed nursing note noted HEENT showed arcus senilis neck was supple without JVD or bruit lung fields were clear heart examination revealed a regular rate and rhythm abdomen was distended and tense without focal masses extremity examination revealed 1+ edema neurological examination of the revealed him to be appropriate alert and oriented with full cognition    Assessment & Plan:  I am concerned about impending complete renal failure with need for urgent dialysis to bump up in his creatinine after his Demadex was increased is of great concern I have adjusted his diuretic dose given him instructions for by mouth hydration medications to control his nausea added a new diabetic agent to improve  control that we'll not need renal dosing Placed a referral back to his renal doctor for an as soon as possible consult. Added Lyrica for the numbness and burning in his feet I discussed with the patient a very low threshold for presentation at the emergency room for admission should the adjustment of the diuretic not result in improvement of symptoms and instructed him to go to the emergency room we will see if we can get his renal doctor to see him this week and ordered a basic metabolic panel for next Tuesday for a  followup visit with [place] 45 minutes were spent face-to-face with this patient discussing his problems and counseling him on appropriate care of which over half was for counseling

## 2010-10-14 NOTE — Assessment & Plan Note (Signed)
Control the underlying disease by improving diabetic control significant worsening of diabetic control the last year with the onset of renal failure. Control neuropathic pain with Lyrica begin with an evening dose of 75mg  titrate  to 75 mg twice a day

## 2010-10-14 NOTE — Patient Instructions (Signed)
Do not take the evening dose of Demadex then tomorrow begin taking one tablet in the morning and one half tablet in the evening you may use the new nausea medications to control your nausea so that he can increase his fluid intake.  You will have lab work in one week and see me in 2 weeks we have asked Dr. Elza Rafter office to get you an appointment within that timeframe as well

## 2010-10-14 NOTE — Assessment & Plan Note (Signed)
Markedly elevated A1c from 15.1 has reduced to 12.9 but still extremely elevated with the perception that he is not eating D2 chronic nausea.  Fasting glucose in the 277 range with severe electrolyte abnormality due to hyperglycemia dehydration and alkalosis due to contraction of volume.  Triglycerides are 569.  Intervention will include control and nausea so he can orally hydrate if not we may have to use IV hydration monitor potassium after IV hydration severe worsening of renal function noted. I do not have access to the labs from the renal physician from the second week in April but his creatinine has increased from 2.9 to 4.8

## 2010-10-18 ENCOUNTER — Ambulatory Visit
Admission: RE | Admit: 2010-10-18 | Discharge: 2010-10-18 | Disposition: A | Discharge status: Home / Self Care | Payer: Medicare Other | Source: Ambulatory Visit | Attending: Internal Medicine | Admitting: Internal Medicine

## 2010-10-18 DIAGNOSIS — M549 Dorsalgia, unspecified: Secondary | ICD-10-CM

## 2010-10-22 ENCOUNTER — Other Ambulatory Visit (INDEPENDENT_AMBULATORY_CARE_PROVIDER_SITE_OTHER): Payer: Medicare Other

## 2010-10-22 DIAGNOSIS — E119 Type 2 diabetes mellitus without complications: Secondary | ICD-10-CM

## 2010-10-22 LAB — BASIC METABOLIC PANEL
BUN: 45 mg/dL — ABNORMAL HIGH (ref 6–23)
CO2: 31 mEq/L (ref 19–32)
Calcium: 9.3 mg/dL (ref 8.4–10.5)
Chloride: 98 mEq/L (ref 96–112)
Creatinine, Ser: 5.8 mg/dL (ref 0.4–1.5)

## 2010-10-23 ENCOUNTER — Ambulatory Visit (HOSPITAL_COMMUNITY)
Admission: RE | Admit: 2010-10-23 | Discharge: 2010-10-23 | Disposition: A | Discharge status: Home / Self Care | Payer: Medicare Other | Source: Ambulatory Visit | Attending: Vascular Surgery | Admitting: Vascular Surgery

## 2010-10-23 ENCOUNTER — Encounter (HOSPITAL_COMMUNITY)
Admission: RE | Admit: 2010-10-23 | Discharge: 2010-10-23 | Disposition: A | Discharge status: Home / Self Care | Payer: Medicare Other | Source: Ambulatory Visit | Attending: Vascular Surgery | Admitting: Vascular Surgery

## 2010-10-23 ENCOUNTER — Other Ambulatory Visit: Payer: Self-pay | Admitting: Vascular Surgery

## 2010-10-23 DIAGNOSIS — N186 End stage renal disease: Secondary | ICD-10-CM

## 2010-10-23 DIAGNOSIS — Z01811 Encounter for preprocedural respiratory examination: Secondary | ICD-10-CM | POA: Insufficient documentation

## 2010-10-23 DIAGNOSIS — Z01810 Encounter for preprocedural cardiovascular examination: Secondary | ICD-10-CM | POA: Insufficient documentation

## 2010-10-23 DIAGNOSIS — I517 Cardiomegaly: Secondary | ICD-10-CM | POA: Insufficient documentation

## 2010-10-23 DIAGNOSIS — Z01812 Encounter for preprocedural laboratory examination: Secondary | ICD-10-CM | POA: Insufficient documentation

## 2010-10-23 LAB — POCT I-STAT 4, (NA,K, GLUC, HGB,HCT)
Glucose, Bld: 186 mg/dL — ABNORMAL HIGH (ref 70–99)
HCT: 44 % (ref 39.0–52.0)
Hemoglobin: 15 g/dL (ref 13.0–17.0)

## 2010-10-24 ENCOUNTER — Other Ambulatory Visit: Payer: Self-pay | Admitting: Internal Medicine

## 2010-10-24 DIAGNOSIS — M5416 Radiculopathy, lumbar region: Secondary | ICD-10-CM

## 2010-10-27 ENCOUNTER — Ambulatory Visit (HOSPITAL_COMMUNITY)
Admission: RE | Admit: 2010-10-27 | Discharge: 2010-10-27 | Disposition: A | Discharge status: Home / Self Care | Payer: Medicare Other | Source: Ambulatory Visit | Attending: Vascular Surgery | Admitting: Vascular Surgery

## 2010-10-27 DIAGNOSIS — I252 Old myocardial infarction: Secondary | ICD-10-CM | POA: Insufficient documentation

## 2010-10-27 DIAGNOSIS — N189 Chronic kidney disease, unspecified: Secondary | ICD-10-CM | POA: Insufficient documentation

## 2010-10-27 DIAGNOSIS — N186 End stage renal disease: Secondary | ICD-10-CM

## 2010-10-27 DIAGNOSIS — K219 Gastro-esophageal reflux disease without esophagitis: Secondary | ICD-10-CM | POA: Insufficient documentation

## 2010-10-27 DIAGNOSIS — E1129 Type 2 diabetes mellitus with other diabetic kidney complication: Secondary | ICD-10-CM | POA: Insufficient documentation

## 2010-10-27 DIAGNOSIS — I120 Hypertensive chronic kidney disease with stage 5 chronic kidney disease or end stage renal disease: Secondary | ICD-10-CM

## 2010-10-27 DIAGNOSIS — I129 Hypertensive chronic kidney disease with stage 1 through stage 4 chronic kidney disease, or unspecified chronic kidney disease: Secondary | ICD-10-CM | POA: Insufficient documentation

## 2010-10-27 DIAGNOSIS — G4733 Obstructive sleep apnea (adult) (pediatric): Secondary | ICD-10-CM | POA: Insufficient documentation

## 2010-10-27 LAB — GLUCOSE, CAPILLARY
Glucose-Capillary: 109 mg/dL — ABNORMAL HIGH (ref 70–99)
Glucose-Capillary: 96 mg/dL (ref 70–99)
Glucose-Capillary: 73 mg/dL (ref 70–99)

## 2010-10-29 ENCOUNTER — Encounter: Payer: Self-pay | Admitting: Internal Medicine

## 2010-10-29 ENCOUNTER — Ambulatory Visit (INDEPENDENT_AMBULATORY_CARE_PROVIDER_SITE_OTHER): Payer: Medicare Other | Admitting: Internal Medicine

## 2010-10-29 VITALS — BP 116/68 | HR 76 | Temp 98.8°F | Resp 16 | Ht 70.0 in | Wt 272.0 lb

## 2010-10-29 DIAGNOSIS — I10 Essential (primary) hypertension: Secondary | ICD-10-CM

## 2010-10-29 DIAGNOSIS — E876 Hypokalemia: Secondary | ICD-10-CM

## 2010-10-29 DIAGNOSIS — G4733 Obstructive sleep apnea (adult) (pediatric): Secondary | ICD-10-CM

## 2010-10-29 DIAGNOSIS — E119 Type 2 diabetes mellitus without complications: Secondary | ICD-10-CM

## 2010-10-29 LAB — POCT I-STAT 4, (NA,K, GLUC, HGB,HCT)
Glucose, Bld: 92 mg/dL (ref 70–99)
HCT: 43 % (ref 39.0–52.0)
Hemoglobin: 14.6 g/dL (ref 13.0–17.0)

## 2010-10-29 MED ORDER — POTASSIUM CHLORIDE ER 8 MEQ PO TBCR: 8.0000 meq | EXTENDED_RELEASE_TABLET | Freq: Two times a day (BID) | ORAL | Status: DC

## 2010-10-29 NOTE — Progress Notes (Signed)
Subjective:    Patient ID: [name], male    DOB: December 14, 1950, 60 y.o.   MRN: [id number]  HPI Patient seen for followup of chronic kidney disease.  He had a left antecubital fistula placed. It will not be usable for 12 weeks.  During this hospitalization he was noted to have a low potassium of 2.9 which will need to correct today but his capillary blood glucoses have been doing quite well in the 100 range prior to surgery He still predialysis with good blood pressure control. He believes his blood sugar has been in good control since his surgery.  He presents for a wound check and for appropriate monitoring   Review of Systems  Constitutional: Negative for fever and fatigue.  HENT: Positive for congestion and rhinorrhea. Negative for hearing loss, neck pain and postnasal drip.   Eyes: Negative for discharge, redness and visual disturbance.  Respiratory: Negative for cough, shortness of breath and wheezing.   Cardiovascular: Positive for leg swelling.  Gastrointestinal: Negative for abdominal pain, constipation and abdominal distention.  Genitourinary: Negative for urgency and frequency.  Musculoskeletal: Negative for joint swelling and arthralgias.  Skin: Negative for color change and rash.       History and place on left antecubital  Neurological: Negative for weakness and light-headedness.  Hematological: Negative for adenopathy.  Psychiatric/Behavioral: Negative for behavioral problems.   Past Medical History  Diagnosis Date  . Coronary atherosclerosis of native coronary artery   . Other malaise and fatigue   . Chest pain, unspecified   . Other dyspnea and respiratory abnormality   . Unspecified essential hypertension   . Personal history of unspecified circulatory disease   . Dysphagia, unspecified   . Internal hemorrhoids without mention of complication   . Hypertrophy of prostate with urinary obstruction and other lower urinary tract symptoms (LUTS)   . Nausea  alone   . Gastroparesis   . Backache, unspecified   . Chronic kidney disease, stage III (moderate)   . Posttraumatic stress disorder   . Hemorrhage of rectum and anus   . Allergy    Past Surgical History  Procedure Date  . Hemorrhoid surgery   . Anal fissurectomy     reports that he has never smoked. He does not have any smokeless tobacco history on file. He reports that he does not drink alcohol or use illicit drugs. family history includes Diabetes in his maternal aunt, maternal uncle, paternal aunt, paternal uncle, and sister and Heart disease in an unspecified family member. No Known Allergies     Objective:   Physical Exam    Blood pressure 116/68, pulse 76, temperature 98.8 F (37.1 C), resp. rate 16, height 5\' 10"  (1.778 m), weight 272 lb (123.378 kg).   On physical examination he is a pleasant 60 year old male in no apparent distress.  He has surgical site on his left antecubital site is well approximated without signs of infection Steri-Strips remain in place there is a palpable thrill.  His lungs are clear to auscultation and percussion his heart examination revealed regular rate and rhythm abdomen is soft but protuberant extremity examination reveals persistent edema  Assessment & Plan:  Patient had extensive blood work drawn by Dr. Eliott Nine yesterday therefore we'll not repeat much of the blood work however his potassium was 2.9 at Hospital and was low before that so we will need to replace his potassium.  I am hopeful that Dr. Eliott Nine do a hemoglobin A1c and her blood work  His blood  pressure is stable  His blood sugars appear to be stable  His fistula appears to be functioning. Replace the  potassium

## 2010-11-04 ENCOUNTER — Ambulatory Visit (HOSPITAL_COMMUNITY): Payer: Medicare Other | Attending: Nephrology

## 2010-11-04 DIAGNOSIS — E876 Hypokalemia: Secondary | ICD-10-CM | POA: Insufficient documentation

## 2010-11-11 NOTE — Op Note (Signed)
  [name], [name] NO.:  [id number]  MEDICAL RECORD NO.:  [id number]  LOCATION:  [facility]                         FACILITY:  [facility]  PHYSICIAN:  Larina Earthly, M.D.    DATE OF BIRTH:  Mar 15, 1951  DATE OF PROCEDURE:  10/27/2010 DATE OF DISCHARGE:  10/27/2010                              OPERATIVE REPORT   PREOPERATIVE DIAGNOSIS:  Chronic renal insufficiency.  POSTOPERATIVE DIAGNOSIS:  Chronic renal insufficiency.  PROCEDURE:  Left upper arm arteriovenous fistula creation.  SURGEON:  Larina Earthly, MD  ASSISTANT:  Pecola Leisure, PA-C  ANESTHESIA:  MAC.  COMPLICATIONS:  None.  DISPOSITION:  To recovery in stable.  PROCEDURE IN DETAIL:  The patient was taken to the operating room and placed in supine position where the left arm was prepped and draped in the usual sterile fashion.  An incision was made over the antecubital space after imaging the vein with ultrasound showing a large cephalic vein.  The vein was mobilized as the antecubital vein.  The basilic vein was ligated with 2-0 silk ties and divided as it came off the antecubital plane.  The brachial artery was exposed through the same incision and was of excellent caliber.  The cephalic vein was ligated distally and was divided and was mobilized to the level of the brachial artery.  The brachial artery was occluded proximal and distally and was opened with an 11 blade and extended longitudinally with Potts scissors. The vein was cut to appropriate length.  It was spatulated and sewn end to side to the artery with a running 6-0 Prolene suture.  Clamp was removed and excellent thrill was noted.  The wound was irrigated with saline.  Hemostasis was achieve with electrocautery.  Wound was closed with 3-0 Vicryl on the subcutaneous and subcuticular tissue.  Benzoin and Steri-Strips were applied.     Larina Earthly, M.D.     TFE/MEDQ  D:  10/27/2010  T:  10/28/2010  Job:   [id number]  Electronically Signed by Evvie Behrmann M.D. on 11/11/2010 01:10:54 PM

## 2010-11-25 ENCOUNTER — Ambulatory Visit (INDEPENDENT_AMBULATORY_CARE_PROVIDER_SITE_OTHER): Payer: Medicare Other | Admitting: Vascular Surgery

## 2010-11-25 DIAGNOSIS — N186 End stage renal disease: Secondary | ICD-10-CM

## 2010-11-25 NOTE — Assessment & Plan Note (Signed)
OFFICE VISIT  [name], NEEDLE DOB:  [birth year]                                       11/25/2010 WGNFA#:[id number]  The patient presents today for followup of his left upper arm AV fistula creation by myself on 10/27/2010.  He has an excellent thrill in his cephalic vein fistula and good Shavonte Zhao maturation.  He does have some tortuosity.  Despite his obesity the vein is relatively superficial.  He will continue observation of this.  His incision itself is well-healed and has no further discomfort related to the surgical incision.  I plan to see him again in 2 months for continued followup.  He did have a question regarding peritoneal dialysis and I have asked him to address this with Dr. Eliott Nine.    Larina Earthly, M.D. Electronically Signed  TFE/MEDQ  D:  11/25/2010  T:  11/25/2010  Job:  [id number]  cc:   Duke Salvia. Eliott Nine, M.D.

## 2010-12-10 ENCOUNTER — Other Ambulatory Visit: Payer: Self-pay | Admitting: Internal Medicine

## 2010-12-29 ENCOUNTER — Ambulatory Visit (INDEPENDENT_AMBULATORY_CARE_PROVIDER_SITE_OTHER): Payer: Medicare Other | Admitting: Internal Medicine

## 2010-12-29 ENCOUNTER — Encounter: Payer: Self-pay | Admitting: Internal Medicine

## 2010-12-29 VITALS — BP 132/70 | Temp 98.2°F | Ht 70.5 in | Wt 277.0 lb

## 2010-12-29 DIAGNOSIS — E1165 Type 2 diabetes mellitus with hyperglycemia: Secondary | ICD-10-CM

## 2010-12-29 DIAGNOSIS — I10 Essential (primary) hypertension: Secondary | ICD-10-CM

## 2010-12-29 DIAGNOSIS — E1169 Type 2 diabetes mellitus with other specified complication: Secondary | ICD-10-CM

## 2010-12-29 DIAGNOSIS — G4733 Obstructive sleep apnea (adult) (pediatric): Secondary | ICD-10-CM

## 2010-12-29 DIAGNOSIS — E785 Hyperlipidemia, unspecified: Secondary | ICD-10-CM

## 2010-12-29 LAB — LIPID PANEL
Cholesterol: 219 mg/dL — ABNORMAL HIGH (ref 0–200)
VLDL: 39.6 mg/dL (ref 0.0–40.0)

## 2010-12-29 LAB — LDL CHOLESTEROL, DIRECT: Direct LDL: 136.5 mg/dL

## 2010-12-29 LAB — HEMOGLOBIN A1C: Hgb A1c MFr Bld: 10 % — ABNORMAL HIGH (ref 4.6–6.5)

## 2010-12-29 NOTE — Progress Notes (Signed)
  Subjective:    Patient ID: [name], male    DOB: Dec 03, 1950, 60 y.o.   MRN: [id number]  HPI Follow up DM and HTN Acute complaint of hoarseness decreased appetite and missing insulin due to skipping meals Resumed testosterone  Was treated with iron IV by renal New fistula with swelling     Review of Systems  Constitutional: Negative for fever and fatigue.  HENT: Negative for hearing loss, congestion, neck pain and postnasal drip.   Eyes: Negative for discharge, redness and visual disturbance.  Respiratory: Negative for cough, shortness of breath and wheezing.   Cardiovascular: Negative for leg swelling.  Gastrointestinal: Negative for abdominal pain, constipation and abdominal distention.  Genitourinary: Negative for urgency and frequency.  Musculoskeletal: Negative for joint swelling and arthralgias.  Skin: Negative for color change and rash.  Neurological: Negative for weakness and light-headedness.  Hematological: Negative for adenopathy.  Psychiatric/Behavioral: Negative for behavioral problems.   Past Medical History  Diagnosis Date  . Coronary atherosclerosis of native coronary artery   . Other malaise and fatigue   . Chest pain, unspecified   . Other dyspnea and respiratory abnormality   . Unspecified essential hypertension   . Personal history of unspecified circulatory disease   . Dysphagia, unspecified   . Internal hemorrhoids without mention of complication   . Hypertrophy of prostate with urinary obstruction and other lower urinary tract symptoms (LUTS)   . Nausea alone   . Gastroparesis   . Backache, unspecified   . Chronic kidney disease, stage III (moderate)   . Posttraumatic stress disorder   . Hemorrhage of rectum and anus   . Allergy   . Diabetes mellitus 2004  . Stroke   . CHF (congestive heart failure)   . Myocardial infarction 2002  . Hyperlipidemia   . Arthritis   . Dizziness   . Headache   . Obesity    Past Surgical History    Procedure Date  . Hemorrhoid surgery   . Anal fissurectomy   . Av fistula placement     reports that he has never smoked. He has never used smokeless tobacco. He reports that he does not drink alcohol or use illicit drugs. family history includes Diabetes in his maternal aunt, maternal uncle, paternal aunt, paternal uncle, and sister and Heart disease in his brother, father, sister, and unspecified family member. No Known Allergies     Objective:   Physical Exam  Nursing note and vitals reviewed. Constitutional: He appears well-developed and well-nourished.  HENT:  Head: Normocephalic and atraumatic.  Eyes: Conjunctivae are normal. Pupils are equal, round, and reactive to light.  Neck: Normal range of motion. Neck supple.  Cardiovascular: Normal rate and regular rhythm.   Pulmonary/Chest: Effort normal and breath sounds normal.  Abdominal: Soft. Bowel sounds are normal.          Assessment & Plan:  Patient is followed by the renal service and will begin dialysis soon his fistula has been placed and is working. He has obstructive sleep apnea and is encouraged to faithfully wear his CPAP at night his blood pressure is stable on his current medications he denies any new chest pain shortness of breath PND orthopnea hemoglobin A1c will be monitored today for diabetic monitoring and concerned that he is not eating appropriately based upon weight gain and lack of exercise I believe his A1c will show an elevation to

## 2010-12-31 ENCOUNTER — Encounter: Payer: Self-pay | Admitting: Cardiology

## 2011-01-07 ENCOUNTER — Encounter: Payer: Self-pay | Admitting: Vascular Surgery

## 2011-01-23 ENCOUNTER — Encounter: Payer: Self-pay | Admitting: Vascular Surgery

## 2011-01-27 ENCOUNTER — Encounter: Payer: Self-pay | Admitting: Internal Medicine

## 2011-01-27 ENCOUNTER — Ambulatory Visit: Payer: Medicare Other | Admitting: Vascular Surgery

## 2011-02-02 ENCOUNTER — Encounter: Payer: Self-pay | Admitting: Vascular Surgery

## 2011-02-03 ENCOUNTER — Ambulatory Visit (INDEPENDENT_AMBULATORY_CARE_PROVIDER_SITE_OTHER): Payer: Medicare Other | Admitting: Vascular Surgery

## 2011-02-03 ENCOUNTER — Encounter: Payer: Self-pay | Admitting: Vascular Surgery

## 2011-02-03 VITALS — BP 125/76 | HR 67 | Resp 16 | Wt 265.0 lb

## 2011-02-03 DIAGNOSIS — N186 End stage renal disease: Secondary | ICD-10-CM

## 2011-02-03 NOTE — Progress Notes (Signed)
The patient presents today for followup of his AV fistula. He had a left upper arm fistula creation by myself in 10/27/2010. He has not progressed to renal failure. He has no changes in his medical history social history or review of systems.  Past Medical History  Diagnosis Date  . Coronary atherosclerosis of native coronary artery   . Other malaise and fatigue   . Chest pain, unspecified   . Other dyspnea and respiratory abnormality   . Unspecified essential hypertension   . Personal history of unspecified circulatory disease   . Dysphagia, unspecified   . Internal hemorrhoids without mention of complication   . Hypertrophy of prostate with urinary obstruction and other lower urinary tract symptoms (LUTS)   . Nausea alone   . Gastroparesis   . Backache, unspecified   . Chronic kidney disease, stage III (moderate)   . Posttraumatic stress disorder   . Hemorrhage of rectum and anus   . Allergy   . Diabetes mellitus 2004  . Stroke   . CHF (congestive heart failure)   . Myocardial infarction 2002  . Hyperlipidemia   . Arthritis   . Dizziness   . Headache   . Obesity     History  Substance Use Topics  . Smoking status: Never Smoker   . Smokeless tobacco: Never Used  . Alcohol Use: No    Family History  Problem Relation Age of Onset  . Diabetes Sister   . Heart disease Sister   . Diabetes Maternal Aunt   . Diabetes Maternal Uncle   . Diabetes Paternal Aunt   . Diabetes Paternal Uncle   . Heart disease    . Heart disease Father   . Heart disease Brother     No Known Allergies  Current outpatient prescriptions:allopurinol (ZYLOPRIM) 100 MG tablet, Take 100 mg by mouth 2 (two) times daily. , Disp: , Rfl: ;  aspirin 81 MG tablet, Take 81 mg by mouth daily.  , Disp: , Rfl: ;  calcitRIOL (ROCALTROL) 0.25 MCG capsule, Take 0.5 mcg by mouth daily. , Disp: , Rfl: ;  clopidogrel (PLAVIX) 75 MG tablet, Take 75 mg by mouth daily.  , Disp: , Rfl: ;  diazepam (VALIUM) 5 MG tablet,  Take 5 mg by mouth daily as needed.  , Disp: , Rfl:  HUMALOG MIX 50/50 KWIKPEN (50-50) 100 UNIT/ML SUSP, USE 40 UNITS BEFORE BREAKFAST AND BEFORE DINNER, Disp: 30 Syringe, Rfl: 11;  hydrALAZINE (APRESOLINE) 50 MG tablet, Take 50 mg by mouth 2 (two) times daily.  , Disp: , Rfl: ;  insulin glargine (LANTUS) 100 UNIT/ML injection, Inject 90 Units into the skin at bedtime.  , Disp: , Rfl:  Insulin Pen Needle 31G X 8 MM MISC, by Does not apply route. Bd u/f short pen needle 31gx73mm misc(insulin pen needle)--to use 4 times a day with insulin pen , Disp: , Rfl: ;  metoprolol (LOPRESSOR) 50 MG tablet, Take 50 mg by mouth 2 (two) times daily.  , Disp: , Rfl: ;  ondansetron (ZOFRAN) 4 MG tablet, Take 1 tablet (4 mg total) by mouth daily as needed for nausea., Disp: 30 tablet, Rfl: 1 potassium chloride (KLOR-CON) 8 MEQ tablet, Take 1 tablet (8 mEq total) by mouth 2 (two) times daily., Disp: 60 tablet, Rfl: 11;  pregabalin (LYRICA) 75 MG capsule, Take 1 capsule (75 mg total) by mouth 2 (two) times daily., Disp: 90 capsule, Rfl: 2;  rosuvastatin (CRESTOR) 40 MG tablet, Take 40 mg by mouth daily.  ,  Disp: , Rfl: ;  solifenacin (VESICARE) 5 MG tablet, Take 10 mg by mouth daily.  , Disp: , Rfl:  Tamsulosin HCl (FLOMAX) 0.4 MG CAPS, Take by mouth.  , Disp: , Rfl: ;  testosterone cypionate (DEPO-TESTOSTERONE) 200 MG/ML injection, Inject into the muscle every 14 (fourteen) days.  , Disp: , Rfl: ;  torsemide (DEMADEX) 100 MG tablet, Take 100 mg by mouth 2 (two) times daily at 10 AM and 5 PM. 1 in am and 1/2 in pm, Disp: , Rfl: ;  traMADol (ULTRAM) 50 MG tablet, Take 50 mg by mouth every 6 (six) hours as needed.  , Disp: , Rfl:  VITAMIN D, ERGOCALCIFEROL, PO, Take by mouth daily.  , Disp: , Rfl: ;  amLODipine (NORVASC) 10 MG tablet, Take 10 mg by mouth daily.  , Disp: , Rfl: ;  ISOSORBIDE PO, Take 90 mg by mouth daily.  , Disp: , Rfl:   BP 125/76  Pulse 67  Resp 16  Wt 265 lb (120.203 kg)  There is no height on file to  calculate BMI.  Physical exam: Well-developed obese black male in no acute distress. 2+ left radial pulse. He does have tortuous cephalic vein fistula in the upper arm with excellent thrill. I imaged this with the SonoSite ultrasound. He has very good size maturation of the vein fistula. This is somewhat deep and is quite tortuous.  Impression and plan: I discussed options with the patient. I have recommended revision of his fistula rather than continued observation. I have recommended mobilization of the cephalic vein fistula and translocation to straighten this and make it more superficial. I feel that an excellent chance for long-term use of this left upper arm AV fistula. We will schedule this at his convenience and he will contact our office for surgical scheduling.

## 2011-02-13 ENCOUNTER — Encounter (HOSPITAL_COMMUNITY)
Admission: RE | Admit: 2011-02-13 | Discharge: 2011-02-13 | Disposition: A | Discharge status: Home / Self Care | Payer: Medicare Other | Source: Ambulatory Visit | Attending: Vascular Surgery | Admitting: Vascular Surgery

## 2011-02-23 ENCOUNTER — Ambulatory Visit (HOSPITAL_COMMUNITY)
Admission: RE | Admit: 2011-02-23 | Discharge: 2011-02-23 | Disposition: A | Discharge status: Home / Self Care | Payer: Medicare Other | Source: Ambulatory Visit | Attending: Vascular Surgery | Admitting: Vascular Surgery

## 2011-02-23 DIAGNOSIS — N189 Chronic kidney disease, unspecified: Secondary | ICD-10-CM | POA: Insufficient documentation

## 2011-02-23 DIAGNOSIS — Z01812 Encounter for preprocedural laboratory examination: Secondary | ICD-10-CM | POA: Insufficient documentation

## 2011-02-23 DIAGNOSIS — N186 End stage renal disease: Secondary | ICD-10-CM

## 2011-02-23 LAB — POCT I-STAT 4, (NA,K, GLUC, HGB,HCT)
HCT: 46 % (ref 39.0–52.0)
Hemoglobin: 15.6 g/dL (ref 13.0–17.0)
Sodium: 137 mEq/L (ref 135–145)

## 2011-02-23 LAB — GLUCOSE, CAPILLARY
Glucose-Capillary: 158 mg/dL — ABNORMAL HIGH (ref 70–99)
Glucose-Capillary: 115 mg/dL — ABNORMAL HIGH (ref 70–99)

## 2011-02-27 ENCOUNTER — Emergency Department (HOSPITAL_COMMUNITY): Payer: Medicare Other

## 2011-02-27 ENCOUNTER — Emergency Department (HOSPITAL_COMMUNITY)
Admission: EM | Admit: 2011-02-27 | Discharge: 2011-02-28 | Disposition: A | Discharge status: Home / Self Care | Payer: Medicare Other | Attending: Emergency Medicine | Admitting: Emergency Medicine

## 2011-02-27 DIAGNOSIS — F3289 Other specified depressive episodes: Secondary | ICD-10-CM | POA: Insufficient documentation

## 2011-02-27 DIAGNOSIS — E119 Type 2 diabetes mellitus without complications: Secondary | ICD-10-CM | POA: Insufficient documentation

## 2011-02-27 DIAGNOSIS — I129 Hypertensive chronic kidney disease with stage 1 through stage 4 chronic kidney disease, or unspecified chronic kidney disease: Secondary | ICD-10-CM | POA: Insufficient documentation

## 2011-02-27 DIAGNOSIS — N189 Chronic kidney disease, unspecified: Secondary | ICD-10-CM | POA: Insufficient documentation

## 2011-02-27 DIAGNOSIS — F329 Major depressive disorder, single episode, unspecified: Secondary | ICD-10-CM | POA: Insufficient documentation

## 2011-02-27 DIAGNOSIS — R0602 Shortness of breath: Secondary | ICD-10-CM | POA: Insufficient documentation

## 2011-02-27 DIAGNOSIS — K219 Gastro-esophageal reflux disease without esophagitis: Secondary | ICD-10-CM | POA: Insufficient documentation

## 2011-02-27 DIAGNOSIS — Z79899 Other long term (current) drug therapy: Secondary | ICD-10-CM | POA: Insufficient documentation

## 2011-02-27 DIAGNOSIS — R079 Chest pain, unspecified: Secondary | ICD-10-CM | POA: Insufficient documentation

## 2011-02-27 DIAGNOSIS — Z794 Long term (current) use of insulin: Secondary | ICD-10-CM | POA: Insufficient documentation

## 2011-02-27 DIAGNOSIS — I2510 Atherosclerotic heart disease of native coronary artery without angina pectoris: Secondary | ICD-10-CM | POA: Insufficient documentation

## 2011-02-27 DIAGNOSIS — Z7982 Long term (current) use of aspirin: Secondary | ICD-10-CM | POA: Insufficient documentation

## 2011-02-27 DIAGNOSIS — R51 Headache: Secondary | ICD-10-CM | POA: Insufficient documentation

## 2011-02-27 DIAGNOSIS — M6281 Muscle weakness (generalized): Secondary | ICD-10-CM | POA: Insufficient documentation

## 2011-02-27 DIAGNOSIS — I10 Essential (primary) hypertension: Secondary | ICD-10-CM | POA: Insufficient documentation

## 2011-02-27 LAB — BASIC METABOLIC PANEL
CO2: 32 mEq/L (ref 19–32)
Calcium: 9.4 mg/dL (ref 8.4–10.5)
Chloride: 98 mEq/L (ref 96–112)
GFR calc Af Amer: 15 mL/min — ABNORMAL LOW (ref >90)
Sodium: 138 mEq/L (ref 135–145)

## 2011-02-27 LAB — CBC
HCT: 42.2 % (ref 39.0–52.0)
Hemoglobin: 13.7 g/dL (ref 13.0–17.0)
WBC: 4.7 10*3/uL (ref 4.0–10.5)

## 2011-02-27 LAB — POCT I-STAT TROPONIN I: Troponin i, poc: 0.02 ng/mL (ref 0.00–0.08)

## 2011-02-27 LAB — DIFFERENTIAL
Basophils Absolute: 0 10*3/uL (ref 0.0–0.1)
Basophils Relative: 0 % (ref 0–1)
Lymphocytes Relative: 25 % (ref 12–46)
Neutro Abs: 2.7 10*3/uL (ref 1.7–7.7)
Neutrophils Relative %: 57 % (ref 43–77)

## 2011-03-06 ENCOUNTER — Encounter: Payer: Self-pay | Admitting: Internal Medicine

## 2011-03-09 ENCOUNTER — Encounter: Payer: Self-pay | Admitting: Internal Medicine

## 2011-03-09 ENCOUNTER — Ambulatory Visit (INDEPENDENT_AMBULATORY_CARE_PROVIDER_SITE_OTHER): Payer: Medicare Other | Admitting: Internal Medicine

## 2011-03-09 VITALS — BP 130/80 | HR 80 | Temp 98.2°F | Resp 20 | Ht 70.5 in | Wt 254.0 lb

## 2011-03-09 DIAGNOSIS — J329 Chronic sinusitis, unspecified: Secondary | ICD-10-CM

## 2011-03-09 MED ORDER — DIAZEPAM 5 MG PO TABS: 5.0000 mg | ORAL_TABLET | Freq: Every day | ORAL | Status: DC | PRN

## 2011-03-09 MED ORDER — AZITHROMYCIN 250 MG PO TABS: ORAL_TABLET | ORAL | Status: AC

## 2011-03-09 NOTE — Progress Notes (Signed)
  Subjective:    Patient ID: [name], male    DOB: 1950/07/11, 60 y.o.   MRN: [id number]  HPI  Head ache and post anesthesia head ache Scans normal Symptoms of URI  Review of Systems     Objective:   Physical Exam        Assessment & Plan:  Symptomatic treatment for her upper respiratory tract infection with over-the-counter medication

## 2011-03-09 NOTE — Patient Instructions (Signed)
Robitussin fast max cough and cold liquid takes 1 cap full 3 times a day

## 2011-03-13 NOTE — Op Note (Signed)
  [name], [name] NO.:  [id number]  MEDICAL RECORD NO.:  [id number]  LOCATION:  [facility]                         FACILITY:  [facility]  PHYSICIAN:  Larina Earthly, M.D.    DATE OF BIRTH:  07/27/1950  DATE OF PROCEDURE:  02/23/2011 DATE OF DISCHARGE:  02/23/2011                              OPERATIVE REPORT   PREOPERATIVE DIAGNOSIS:  Chronic renal insufficiency with tortuous and deepened left upper arm arteriovenous fistula.  POSTOPERATIVE DIAGNOSIS:  Chronic renal insufficiency with tortuous and deepened left upper arm arteriovenous fistula.  PROCEDURE:  Cephalic vein transposition.  SURGEON:  Larina Earthly, MD  ASSISTANT:  Newton Pigg, PA-C  ANESTHESIA:  General endotracheal.  COMPLICATIONS:  None.  DISPOSITION:  To recovery room, stable.  INDICATIONS FOR PROCEDURE:  The patient is a 61 year old gentleman with progressive chronic renal insufficiency, had a left upper arm AV fistula creation approximately 2 months ago.  He has had excellent maturation of this fistula.  He is a large man and the vein ran deep under the fascia and was tortuous.  For this reason, the cephalic vein transposition was recommended to the patient.  PROCEDURE IN DETAIL:  The patient was taken to the operating room and placed in supine position where the area of the left arm was prepped and draped in usual sterile fashion.  Incision was made over the antecubital space carried down to isolate the brachial artery to cephalic vein fistula.  The patient had a very large fistula.  Separate incisions were made from this area up to the area of the shoulder and the cephalic vein was mobilized circumferentially and tributary branches were ligated with 3-0 and 4-0 silk ties and divided.  After this was completely mobilized, the vein was marked to prevent twisting.  The vein was occluded at the brachial artery anastomosis and at the shoulder, and the vein was divided at the level of  the brachial artery anastomosis.  The vein was brought out of the tunnel and left intact at the level of the shoulder. The vein was gently dilated and was excellent caliber.  A subcutaneous tunnel was brought from the level of the antecubital space up to the shoulder at the area of the cephalic vein and the cephalic vein was brought through this tunnel.  This was lateral to the skin incisions.  The vein was cut to the appropriate length and sewn end-to- end to the vein at the brachial artery with a running 6-0 Prolene suture.  Clamps were removed and excellent thrill was noted.  The wounds were closed with 3-0 Vicryl in the subcutaneous and subcuticular tissue. Benzoin and Steri-Strips were applied.     Larina Earthly, M.D.     TFE/MEDQ  D:  02/23/2011  T:  02/23/2011  Job:  [id number]  Electronically Signed by TODD EARLY M.D. on 03/13/2011 12:55:06 PM

## 2011-03-25 ENCOUNTER — Encounter: Payer: Self-pay | Admitting: Physician Assistant

## 2011-03-25 ENCOUNTER — Ambulatory Visit (INDEPENDENT_AMBULATORY_CARE_PROVIDER_SITE_OTHER): Payer: Medicare Other | Admitting: Physician Assistant

## 2011-03-25 VITALS — BP 137/74 | HR 90 | Resp 20 | Ht 70.0 in | Wt 240.0 lb

## 2011-03-25 DIAGNOSIS — N186 End stage renal disease: Secondary | ICD-10-CM

## 2011-03-25 NOTE — Progress Notes (Signed)
[facility] OFFICE NOTE  CC:  F/U TO left cephalic vein transposition  HPI:  This is a 60 y.o. male here for f/u to a left cephalic vein transposition.  He states that he has an occasional bit of numbness on the tip of his 5th finger, otherwise motor and sensation are in tact.  He is not yet on HD, but the plan is to start once his fistula is mature according to the pt.  No Known Allergies  Current Outpatient Prescriptions  Medication Sig Dispense Refill  . allopurinol (ZYLOPRIM) 100 MG tablet Take 100 mg by mouth 2 (two) times daily.       [name] aspirin 81 MG tablet Take 81 mg by mouth daily.        . calcitRIOL (ROCALTROL) 0.25 MCG capsule Take 0.5 mcg by mouth daily.       . clopidogrel (PLAVIX) 75 MG tablet Take 75 mg by mouth daily.        . diazepam (VALIUM) 5 MG tablet Take 1 tablet (5 mg total) by mouth daily as needed.  30 tablet  3  . HUMALOG MIX 50/50 KWIKPEN (50-50) 100 UNIT/ML SUSP USE 40 UNITS BEFORE BREAKFAST AND BEFORE DINNER  30 Syringe  11  . hydrOXYzine (ATARAX/VISTARIL) 25 MG tablet Take 25 mg by mouth. 1-2 tabs  In pm       . insulin glargine (LANTUS) 100 UNIT/ML injection Inject 90 Units into the skin at bedtime.        . Insulin Pen Needle 31G X 8 MM MISC by Does not apply route. Bd u/f short pen needle 31gx76mm misc(insulin pen needle)--to use 4 times a day with insulin pen       . ISOSORBIDE PO Take 90 mg by mouth daily.        . metoCLOPramide (REGLAN) 5 MG tablet Take 5 mg by mouth 4 (four) times daily.        . metoprolol (LOPRESSOR) 50 MG tablet Take 50 mg by mouth 2 (two) times daily.        . ondansetron (ZOFRAN) 4 MG tablet Take 1 tablet (4 mg total) by mouth daily as needed for nausea.  30 tablet  1  . potassium chloride (KLOR-CON) 8 MEQ tablet Take 1 tablet (8 mEq total) by mouth 2 (two) times daily.  60 tablet  11  . pregabalin (LYRICA) 75 MG capsule Take 1 capsule (75 mg total) by mouth 2 (two) times daily.  90 capsule  2  . rosuvastatin  (CRESTOR) 40 MG tablet Take 40 mg by mouth daily.        . solifenacin (VESICARE) 5 MG tablet Take 10 mg by mouth daily.        . Tamsulosin HCl (FLOMAX) 0.4 MG CAPS Take by mouth.        . testosterone cypionate (DEPO-TESTOSTERONE) 200 MG/ML injection Inject into the muscle every 14 (fourteen) days.        [name] torsemide (DEMADEX) 100 MG tablet Take 100 mg by mouth 2 (two) times daily at 10 AM and 5 PM. 1 in am and 1/2 in pm      . traMADol (ULTRAM) 50 MG tablet Take 50 mg by mouth every 6 (six) hours as needed.        . triamcinolone (KENALOG) 0.1 % cream Apply topically 2 (two) times daily.        [name] VITAMIN D, ERGOCALCIFEROL, PO Take by mouth daily.  ROS:  See HPI  Physical Exam:  Filed Vitals:   03/25/11 1519  BP: 137/74  Pulse: 90  Resp: 20   His incision is well healed. 2+ left radial pulse. Motor and sensation are intact. He has a good bruit and thrill in his fistula.  A/P:  This is a 60 y.o. male here for f/u to his cephalic vein transposition on 02/23/11.  He is doing well and his AVF may be used.  He will f/u with [place] on a prn basis.  Newton Pigg, PA-C  Clinic MD:  Edilia Bo

## 2011-04-14 ENCOUNTER — Telehealth: Payer: Self-pay | Admitting: *Deleted

## 2011-04-14 NOTE — Telephone Encounter (Signed)
Pt started with diarrhea and vomiting at 230 during dialysis.  Has not stopped, Advised him to call his Nephologist or on call MD re: this as Dr. Lovell Sheehan has left for the day.

## 2011-04-17 NOTE — Telephone Encounter (Signed)
Spoke to pt and he is doing much better.

## 2011-05-11 ENCOUNTER — Ambulatory Visit (INDEPENDENT_AMBULATORY_CARE_PROVIDER_SITE_OTHER): Payer: Medicare Other | Admitting: Internal Medicine

## 2011-05-11 ENCOUNTER — Encounter: Payer: Self-pay | Admitting: Internal Medicine

## 2011-05-11 VITALS — BP 120/70 | HR 76 | Temp 98.3°F | Resp 16 | Ht 68.0 in | Wt 240.0 lb

## 2011-05-11 DIAGNOSIS — I10 Essential (primary) hypertension: Secondary | ICD-10-CM

## 2011-05-11 DIAGNOSIS — E119 Type 2 diabetes mellitus without complications: Secondary | ICD-10-CM

## 2011-05-11 DIAGNOSIS — R5381 Other malaise: Secondary | ICD-10-CM

## 2011-05-11 DIAGNOSIS — N184 Chronic kidney disease, stage 4 (severe): Secondary | ICD-10-CM

## 2011-05-11 DIAGNOSIS — R5383 Other fatigue: Secondary | ICD-10-CM

## 2011-05-11 DIAGNOSIS — K429 Umbilical hernia without obstruction or gangrene: Secondary | ICD-10-CM

## 2011-05-11 MED ORDER — LINAGLIPTIN 5 MG PO TABS: 5.0000 mg | ORAL_TABLET | Freq: Every day | ORAL | Status: DC

## 2011-05-11 NOTE — Progress Notes (Signed)
Subjective:    Patient ID: [name], male    DOB: 1950-06-10, 60 y.o.   MRN: [id number]  HPI This is a 60 year old American male who presents for followup of diabetes on referral from the dialysis clinic.  He has done exceptionally well with dialysis having had severe end stage renal disease her progress for the need for hemofiltration.  The patient's weight is 55 pounds from his maximum weight recorded over the past 2 years and I believe that he is no longer going to be insulin requiring for his diabetes.  He has discontinued the insulin at this point but the elevation in blood glucoses will require some sort of intervention In addition to the lantus  His A1c was 10 in August and 9 last month a dialysis   Review of Systems  Constitutional: Negative for fever and fatigue.  HENT: Negative for hearing loss, congestion, neck pain and postnasal drip.   Eyes: Negative for discharge, redness and visual disturbance.  Respiratory: Negative for cough, shortness of breath and wheezing.   Cardiovascular: Negative for leg swelling.  Gastrointestinal: Negative for abdominal pain, constipation and abdominal distention.  Genitourinary: Negative for urgency and frequency.  Musculoskeletal: Negative for joint swelling and arthralgias.  Skin: Negative for color change and rash.  Neurological: Negative for weakness and light-headedness.  Hematological: Negative for adenopathy.  Psychiatric/Behavioral: Negative for behavioral problems.   Past Medical History  Diagnosis Date  . Coronary atherosclerosis of native coronary artery   . Other malaise and fatigue   . Chest pain, unspecified   . Snoring disorder   . Unspecified essential hypertension   . Personal history of unspecified circulatory disease   . Dysphagia, unspecified   . Internal hemorrhoids without mention of complication   . Hypertrophy of prostate with urinary obstruction and other lower urinary tract symptoms (LUTS)   . Nausea  alone   . Gastroparesis   . Backache, unspecified   . Chronic kidney disease, stage III (moderate)   . Posttraumatic stress disorder   . Hemorrhage of rectum and anus   . Allergy   . Diabetes mellitus 2004  . Stroke   . CHF (congestive heart failure)   . Myocardial infarction 2002  . Hyperlipidemia   . Arthritis   . Dizziness   . Headache   . Obesity     History   Social History  . Marital Status: Married    Spouse Name: N/A    Number of Children: N/A  . Years of Education: N/A   Occupational History  . disabled    Social History Main Topics  . Smoking status: Never Smoker   . Smokeless tobacco: Never Used  . Alcohol Use: No  . Drug Use: No  . Sexually Active: Not Currently   Other Topics Concern  . Not on file   Social History Narrative  . No narrative on file    Past Surgical History  Procedure Date  . Hemorrhoid surgery   . Anal fissurectomy   . Av fistula placement   . Revision of fistula     renal failure    Family History  Problem Relation Age of Onset  . Diabetes Sister   . Heart disease Sister   . Diabetes Maternal Aunt   . Diabetes Maternal Uncle   . Diabetes Paternal Aunt   . Diabetes Paternal Uncle   . Heart disease    . Heart disease Father   . Heart disease Brother     No  Known Allergies  Current Outpatient Prescriptions on File Prior to Visit  Medication Sig Dispense Refill  . allopurinol (ZYLOPRIM) 100 MG tablet Take 100 mg by mouth 2 (two) times daily.       [name] aspirin 81 MG tablet Take 81 mg by mouth daily.        . clopidogrel (PLAVIX) 75 MG tablet Take 75 mg by mouth daily.        . diazepam (VALIUM) 5 MG tablet Take 1 tablet (5 mg total) by mouth daily as needed.  30 tablet  3  . HUMALOG MIX 50/50 KWIKPEN (50-50) 100 UNIT/ML SUSP USE 40 UNITS BEFORE BREAKFAST AND BEFORE DINNER  30 Syringe  11  . hydrOXYzine (ATARAX/VISTARIL) 25 MG tablet Take 25 mg by mouth. 1-2 tabs  In pm       . insulin glargine (LANTUS) 100 UNIT/ML  injection Inject 90 Units into the skin at bedtime.        . Insulin Pen Needle 31G X 8 MM MISC by Does not apply route. Bd u/f short pen needle 31gx65mm misc(insulin pen needle)--to use 4 times a day with insulin pen       . rosuvastatin (CRESTOR) 40 MG tablet Take 40 mg by mouth daily.        . solifenacin (VESICARE) 5 MG tablet Take 10 mg by mouth daily.        . Tamsulosin HCl (FLOMAX) 0.4 MG CAPS Take by mouth.        . testosterone cypionate (DEPO-TESTOSTERONE) 200 MG/ML injection Inject into the muscle every 14 (fourteen) days.        [name] torsemide (DEMADEX) 100 MG tablet Take 100 mg by mouth 2 (two) times daily at 10 AM and 5 PM. 1 in am and 1/2 in pm      . traMADol (ULTRAM) 50 MG tablet Take 50 mg by mouth every 6 (six) hours as needed.        . triamcinolone (KENALOG) 0.1 % cream Apply topically 2 (two) times daily.          BP 120/70  Pulse 76  Temp 98.3 F (36.8 C)  Resp 16  Ht 5\' 8"  (1.727 m)  Wt 240 lb (108.863 kg)  BMI 36.49 kg/m2        Objective:   Physical Exam  Nursing note and vitals reviewed. Constitutional: He appears well-developed and well-nourished.  HENT:  Head: Normocephalic and atraumatic.  Eyes: Conjunctivae are normal. Pupils are equal, round, and reactive to light.  Neck: Normal range of motion. Neck supple.  Cardiovascular: Normal rate and regular rhythm.   Pulmonary/Chest: Effort normal and breath sounds normal.  Abdominal: Soft. Bowel sounds are normal.          Assessment & Plan:    Added tradjenta 5 mg for control of DM Weight loss has significantly imporoved control issues Want to resume testosterone here Gout stable blood pressure stable   There medications past medical history social history problem list and allergies were reviewed in detail.   Goals were established with regard to weight loss exercise diet in compliance with medications

## 2011-05-11 NOTE — Patient Instructions (Signed)
The patient is instructed to continue all medications as prescribed. Schedule followup with check out clerk upon leaving the clinic  

## 2011-06-05 ENCOUNTER — Ambulatory Visit (INDEPENDENT_AMBULATORY_CARE_PROVIDER_SITE_OTHER): Payer: Medicare Other | Admitting: Surgery

## 2011-06-05 ENCOUNTER — Encounter (INDEPENDENT_AMBULATORY_CARE_PROVIDER_SITE_OTHER): Payer: Self-pay | Admitting: Surgery

## 2011-06-05 DIAGNOSIS — K439 Ventral hernia without obstruction or gangrene: Secondary | ICD-10-CM

## 2011-06-05 NOTE — Progress Notes (Signed)
Patient ID: [name], male   DOB: 06/03/1950, 61 y.o.   MRN: [id number]  Chief Complaint  Patient presents with  . Other    Eval peri-umbilicial hernia    HPI [name] is a 61 y.o. male.   HPI The patient presents today at the request of Dr. [name] Jenkins due to a hernia above his umbilicus. This has been present for at least 7 years. The patient has lost a considerable amount of weight and the hernia is much more prominent. He is beginning because mild discomfort and becoming difficult for the patient to reduce. He denies any recent nausea or vomiting. He is on hemodialysis and doing well. His diabetes is much improved with his weight loss. He denies any nausea vomiting or severe abdominal pain.  Past Medical History  Diagnosis Date  . Coronary atherosclerosis of native coronary artery   . Other malaise and fatigue   . Chest pain, unspecified   . Snoring disorder   . Unspecified essential hypertension   . Personal history of unspecified circulatory disease   . Dysphagia, unspecified   . Internal hemorrhoids without mention of complication   . Hypertrophy of prostate with urinary obstruction and other lower urinary tract symptoms (LUTS)   . Nausea alone   . Gastroparesis   . Backache, unspecified   . Chronic kidney disease, stage III (moderate)   . Posttraumatic stress disorder   . Hemorrhage of rectum and anus   . Allergy   . Diabetes mellitus 2004  . Stroke   . CHF (congestive heart failure)   . Myocardial infarction 2002  . Hyperlipidemia   . Arthritis   . Dizziness   . Headache   . Obesity   . Chills     Past Surgical History  Procedure Date  . Hemorrhoid surgery   . Anal fissurectomy   . Av fistula placement   . Revision of fistula     renal failure    Family History  Problem Relation Age of Onset  . Diabetes Sister   . Heart disease Sister   . Diabetes Maternal Aunt   . Diabetes Maternal Uncle   . Diabetes Paternal Aunt   . Diabetes Paternal  Uncle   . Heart disease    . Heart disease Father   . Heart disease Brother     Social History History  Substance Use Topics  . Smoking status: Never Smoker   . Smokeless tobacco: Never Used  . Alcohol Use: No    No Known Allergies  Current Outpatient Prescriptions  Medication Sig Dispense Refill  . allopurinol (ZYLOPRIM) 100 MG tablet Take 100 mg by mouth 2 (two) times daily.       . aspirin 81 MG tablet Take 81 mg by mouth daily.        . clopidogrel (PLAVIX) 75 MG tablet Take 75 mg by mouth daily.        . diazepam (VALIUM) 5 MG tablet Take 1 tablet (5 mg total) by mouth daily as needed.  30 tablet  3  . hydrOXYzine (ATARAX/VISTARIL) 25 MG tablet Take 25 mg by mouth. 1-2 tabs  In pm       . insulin glargine (LANTUS) 100 UNIT/ML injection Inject 90 Units into the skin at bedtime.        . Insulin Pen Needle 31G X 8 MM MISC by Does not apply route. Bd u/f short pen needle 31gx8mm misc(insulin pen needle)--to use 4 times a day with   insulin pen       . linagliptin (TRADJENTA) 5 MG TABS tablet Take 1 tablet (5 mg total) by mouth daily.  30 tablet  0  . rosuvastatin (CRESTOR) 40 MG tablet Take 40 mg by mouth daily.        . solifenacin (VESICARE) 5 MG tablet Take 10 mg by mouth daily.        . Tamsulosin HCl (FLOMAX) 0.4 MG CAPS Take by mouth.        . testosterone cypionate (DEPO-TESTOSTERONE) 200 MG/ML injection Inject into the muscle every 14 (fourteen) days.        . torsemide (DEMADEX) 100 MG tablet Take 100 mg by mouth 2 (two) times daily at 10 AM and 5 PM. 1 in am and 1/2 in pm      . traMADol (ULTRAM) 50 MG tablet Take 50 mg by mouth every 6 (six) hours as needed.        . triamcinolone (KENALOG) 0.1 % cream Apply topically 2 (two) times daily.          Review of Systems Review of Systems  Constitutional: Positive for unexpected weight change. Negative for fever and fatigue.  HENT: Negative.   Eyes: Negative.   Respiratory: Negative.   Cardiovascular: Negative.     Gastrointestinal: Negative.   Genitourinary: Negative.   Musculoskeletal: Negative.   Neurological: Negative.   Hematological: Negative.   Psychiatric/Behavioral: Negative.     Blood pressure 146/98, pulse 72, temperature 97.8 F (36.6 C), temperature source Temporal, resp. rate 18, height 5' 10" (1.778 m), weight 240 lb 6.4 oz (109.045 kg).  Physical Exam Physical Exam  Constitutional: He is oriented to person, place, and time. He appears well-developed and well-nourished.  HENT:  Head: Normocephalic and atraumatic.  Eyes: EOM are normal. Pupils are equal, round, and reactive to light.  Neck: Normal range of motion. Neck supple. No tracheal deviation present.  Cardiovascular: Normal rate and regular rhythm.  Exam reveals no gallop and no friction rub.   Murmur heard. Pulmonary/Chest: Effort normal and breath sounds normal. No stridor.  Abdominal: Soft. Bowel sounds are normal. He exhibits no distension. There is no tenderness.    Musculoskeletal: Normal range of motion.       Arms: Neurological: He is alert and oriented to person, place, and time. He has normal strength. He is not disoriented. Gait normal. GCS eye subscore is 4. GCS verbal subscore is 5. GCS motor subscore is 6.  Skin: Skin is warm and dry.  Psychiatric: He has a normal mood and affect. His speech is normal and behavior is normal.    Data Reviewed Dr Jenkin's notes Assessment    2 CM ventral hernia symptomatic    Plan    I had a lengthy discussion with the patient today about options of repair versus observation as well as the pros and cons of each. He has multiple medical problems including renal failure brought he is well managed and well-controlled and doing well with dialysis. The hernia is more symptomatic and he would like it repaired I think is reasonable. Risks, benefits and nonoperative treatments discussed. He would like to proceed with repair of his 2 cm ventral hernia just above his  umbilicus.The risk of hernia repair include bleeding,  Infection,   Recurrence of the hernia,  Mesh use, chronic pain,  Organ injury,  Bowel injury,  Bladder injury,   nerve injury with numbness around the incision,  Death,  and worsening of preexisting  medical   problems.  The alternatives to surgery have been discussed as well..  Long term expectations of both operative and non operative treatments have been discussed.   The patient agrees to proceed.       Jady Braggs A. 06/05/2011, 10:56 AM    

## 2011-06-05 NOTE — Patient Instructions (Signed)
Hernia °A hernia occurs when an internal organ pushes out through a weak spot in the abdominal wall. Hernias most commonly occur in the groin and around the navel. Hernias often can be pushed back into place (reduced). Most hernias tend to get worse over time. Some abdominal hernias can get stuck in the opening (irreducible or incarcerated hernia) and cannot be reduced. An irreducible abdominal hernia which is tightly squeezed into the opening is at risk for impaired blood supply (strangulated hernia). A strangulated hernia is a medical emergency. Because of the risk for an irreducible or strangulated hernia, surgery may be recommended to repair a hernia. °CAUSES  °· Heavy lifting.  °· Prolonged coughing.  °· Straining to have a bowel movement.  °· A cut (incision) made during an abdominal surgery.  °HOME CARE INSTRUCTIONS  °· Bed rest is not required. You may continue your normal activities.  °· Avoid lifting more than 10 pounds (4.5 kg) or straining.  °· Cough gently. If you are a smoker it is best to stop. Even the best hernia repair can break down with the continual strain of coughing. Even if you do not have your hernia repaired, a cough will continue to aggravate the problem.  °· Do not wear anything tight over your hernia. Do not try to keep it in with an outside bandage or truss. These can damage abdominal contents if they are trapped within the hernia sac.  °· Eat a normal diet.  °· Avoid constipation. Straining over long periods of time will increase hernia size and encourage breakdown of repairs. If you cannot do this with diet alone, stool softeners may be used.  °SEEK IMMEDIATE MEDICAL CARE IF:  °· You have a fever.  °· You develop increasing abdominal pain.  °· You feel nauseous or vomit.  °· Your hernia is stuck outside the abdomen, looks discolored, feels hard, or is tender.  °· You have any changes in your bowel habits or in the hernia that are unusual for you.  °· You have increased pain or  swelling around the hernia.  °· You cannot push the hernia back in place by applying gentle pressure while lying down.  °MAKE SURE YOU:  °· Understand these instructions.  °· Will watch your condition.  °· Will get help right away if you are not doing well or get worse.  °Document Released: 05/11/2005 Document Revised: 01/21/2011 Document Reviewed: 12/29/2007 °[employer]® Patient Information ©2012 [employer]. °

## 2011-06-18 ENCOUNTER — Encounter (HOSPITAL_COMMUNITY): Payer: Self-pay | Admitting: Respiratory Therapy

## 2011-06-24 ENCOUNTER — Encounter (HOSPITAL_COMMUNITY): Payer: Self-pay

## 2011-06-24 ENCOUNTER — Encounter (HOSPITAL_COMMUNITY)
Admission: RE | Admit: 2011-06-24 | Discharge: 2011-06-24 | Disposition: A | Discharge status: Home / Self Care | Payer: Medicare Other | Source: Ambulatory Visit | Attending: Surgery | Admitting: Surgery

## 2011-06-24 HISTORY — DX: Sleep apnea, unspecified: G47.30

## 2011-06-24 HISTORY — DX: Adverse effect of unspecified anesthetic, initial encounter: T41.45XA

## 2011-06-24 HISTORY — DX: Other complications of anesthesia, initial encounter: T88.59XA

## 2011-06-24 HISTORY — DX: Angina pectoris, unspecified: I20.9

## 2011-06-24 LAB — CBC
Hemoglobin: 15.7 g/dL (ref 13.0–17.0)
MCV: 85.1 fL (ref 78.0–100.0)
Platelets: 250 10*3/uL (ref 150–400)
RBC: 5.69 MIL/uL (ref 4.22–5.81)
WBC: 6.3 10*3/uL (ref 4.0–10.5)

## 2011-06-24 LAB — COMPREHENSIVE METABOLIC PANEL
ALT: 14 U/L (ref 0–53)
AST: 13 U/L (ref 0–37)
CO2: 32 mEq/L (ref 19–32)
Chloride: 94 mEq/L — ABNORMAL LOW (ref 96–112)
GFR calc Af Amer: 13 mL/min — ABNORMAL LOW (ref >90)
GFR calc non Af Amer: 11 mL/min — ABNORMAL LOW (ref >90)
Glucose, Bld: 129 mg/dL — ABNORMAL HIGH (ref 70–99)
Sodium: 139 mEq/L (ref 135–145)
Total Bilirubin: 0.5 mg/dL (ref 0.3–1.2)

## 2011-06-24 LAB — DIFFERENTIAL
Basophils Absolute: 0 10*3/uL (ref 0.0–0.1)
Basophils Relative: 0 % (ref 0–1)
Eosinophils Absolute: 0.1 10*3/uL (ref 0.0–0.7)
Eosinophils Relative: 2 % (ref 0–5)
Monocytes Absolute: 0.9 10*3/uL (ref 0.1–1.0)

## 2011-06-24 NOTE — Pre-Procedure Instructions (Signed)
20 RONI SCOW  06/24/2011   Your procedure is scheduled on:  Wed, Feb 6 @ 1000 AM  Report to [facility] at 0800 AM.  Call this number if you have problems the morning of surgery: [phone]   Remember:   Do not eat food:After Midnight.  May have clear liquids: up to 4 Hours before arrival.  Clear liquids include soda, tea, black coffee, apple or grape juice, broth.  Take these medicines the morning of surgery with A SIP OF WATER: Allopurinol,Diazepam,Flomax,and Tramadol(if needed)   Do not wear jewelry, make-up or nail polish.  Do not wear lotions, powders, or perfumes. You may wear deodorant.  Do not shave 48 hours prior to surgery.  Do not bring valuables to the hospital.  Contacts, dentures or bridgework may not be worn into surgery.  Leave suitcase in the car. After surgery it may be brought to your room.  For patients admitted to the hospital, checkout time is 11:00 AM the day of discharge.   Patients discharged the day of surgery will not be allowed to drive home.  Name and phone number of your driver:   Special Instructions: CHG Shower Use Special Wash: 1/2 bottle night before surgery and 1/2 bottle morning of surgery.   Please read over the following fact sheets that you were given: Pain Booklet, Coughing and Deep Breathing, MRSA Information and Surgical Site Infection Prevention

## 2011-06-25 NOTE — Consult Note (Signed)
Anesthesia:  Patient is a 61 year old male scheduled for a ventral hernia repair on 07/01/11.  History includes non-obstructive CAD with marked LVH (by 11/23/03 cath), dysphagia, BPH, gastroparesis, PTSD, DM on insulin, HLD, CHF, obesity with recent weight loss, CVA, HTN, ESRD on HD (LUE AVF), OSA (Sleep Study 01/08/10 under Notes tab).  His PCP is Dr. Darryll Capers.  His Cardiologist is Dr. Daleen Squibb with last visit being on 01/15/10 (See Encounters Tab).  EKG on 02/27/11 showed NSR, possible LAE, LAD, possible anterior infarct, lateral T wave abnormality/inversion.  Lateral T wave abnormality was also present on his last [facility] EKG from August 2011.  No CV symptoms were reported at his PAT visit.  Echo on 04/19/09 showed: - Left ventricle: LV cavity near apex is small. There is near cavity obliteration in systole. The cavity size was normal. Wall thickness was increased in a pattern of moderate LVH. Systolic function was vigorous. The estimated ejection fraction was in the range of 65% to 70%. Features are consistent with a pseudonormal left ventricular filling pattern, with concomitant abnormal relaxation and increased filling pressure (grade 2 diastolic dysfunction). - Aorta: Aortic root is normal at 34 mm. 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  His last stress test was in 2006.  CXR from 02/27/11 showed "Prominent cardiomediastinal contours, similar to prior. Mild bibasilar left greater than right scarring versus atelectasis. Bihilar fullness is likely vascular in origin as without significant interval change. No pleural effusion or pneumothorax. No acute osseous abnormality.   Labs noted.  He will get an ISTAT 4 on arrival.  I reviewed his cardiac history and echocardiogram findings  with Anesthesiologist Dr. Krista Blue.  If no new CV symptoms, then plan to proceed.

## 2011-06-30 MED ORDER — CEFAZOLIN SODIUM-DEXTROSE 2-3 GM-% IV SOLR
2.0000 g | INTRAVENOUS | Status: AC
  Administered 2011-07-01: 2 g via INTRAVENOUS
  Filled 2011-06-30: qty 50

## 2011-06-30 MED ORDER — CHLORHEXIDINE GLUCONATE 4 % EX LIQD
1.0000 "application " | Freq: Once | CUTANEOUS | Status: DC
  Filled 2011-06-30: qty 15

## 2011-06-30 MED ORDER — CEFAZOLIN SODIUM 1-5 GM-% IV SOLN: 1.0000 g | INTRAVENOUS | Status: DC

## 2011-07-01 ENCOUNTER — Encounter (HOSPITAL_COMMUNITY): Payer: Self-pay | Admitting: Vascular Surgery

## 2011-07-01 ENCOUNTER — Encounter (HOSPITAL_COMMUNITY): Payer: Self-pay | Admitting: *Deleted

## 2011-07-01 ENCOUNTER — Encounter (HOSPITAL_COMMUNITY)
Admission: RE | Disposition: A | Discharge status: Home / Self Care | Payer: Self-pay | Source: Ambulatory Visit | Attending: Surgery

## 2011-07-01 ENCOUNTER — Ambulatory Visit (HOSPITAL_COMMUNITY)
Admission: RE | Admit: 2011-07-01 | Discharge: 2011-07-01 | Disposition: A | Discharge status: Home / Self Care | Payer: Medicare Other | Source: Ambulatory Visit | Attending: Surgery | Admitting: Surgery

## 2011-07-01 ENCOUNTER — Ambulatory Visit (HOSPITAL_COMMUNITY): Payer: Medicare Other | Admitting: Vascular Surgery

## 2011-07-01 DIAGNOSIS — Z01812 Encounter for preprocedural laboratory examination: Secondary | ICD-10-CM | POA: Insufficient documentation

## 2011-07-01 DIAGNOSIS — Z8679 Personal history of other diseases of the circulatory system: Secondary | ICD-10-CM

## 2011-07-01 DIAGNOSIS — I129 Hypertensive chronic kidney disease with stage 1 through stage 4 chronic kidney disease, or unspecified chronic kidney disease: Secondary | ICD-10-CM | POA: Insufficient documentation

## 2011-07-01 DIAGNOSIS — K625 Hemorrhage of anus and rectum: Secondary | ICD-10-CM

## 2011-07-01 DIAGNOSIS — R079 Chest pain, unspecified: Secondary | ICD-10-CM

## 2011-07-01 DIAGNOSIS — G4733 Obstructive sleep apnea (adult) (pediatric): Secondary | ICD-10-CM

## 2011-07-01 DIAGNOSIS — I10 Essential (primary) hypertension: Secondary | ICD-10-CM

## 2011-07-01 DIAGNOSIS — N138 Other obstructive and reflux uropathy: Secondary | ICD-10-CM

## 2011-07-01 DIAGNOSIS — J309 Allergic rhinitis, unspecified: Secondary | ICD-10-CM

## 2011-07-01 DIAGNOSIS — R51 Headache: Secondary | ICD-10-CM | POA: Insufficient documentation

## 2011-07-01 DIAGNOSIS — I252 Old myocardial infarction: Secondary | ICD-10-CM | POA: Insufficient documentation

## 2011-07-01 DIAGNOSIS — I69998 Other sequelae following unspecified cerebrovascular disease: Secondary | ICD-10-CM | POA: Insufficient documentation

## 2011-07-01 DIAGNOSIS — Z992 Dependence on renal dialysis: Secondary | ICD-10-CM | POA: Insufficient documentation

## 2011-07-01 DIAGNOSIS — K3184 Gastroparesis: Secondary | ICD-10-CM

## 2011-07-01 DIAGNOSIS — M109 Gout, unspecified: Secondary | ICD-10-CM

## 2011-07-01 DIAGNOSIS — R110 Nausea: Secondary | ICD-10-CM

## 2011-07-01 DIAGNOSIS — E119 Type 2 diabetes mellitus without complications: Secondary | ICD-10-CM

## 2011-07-01 DIAGNOSIS — R1310 Dysphagia, unspecified: Secondary | ICD-10-CM

## 2011-07-01 DIAGNOSIS — I509 Heart failure, unspecified: Secondary | ICD-10-CM | POA: Insufficient documentation

## 2011-07-01 DIAGNOSIS — I209 Angina pectoris, unspecified: Secondary | ICD-10-CM | POA: Insufficient documentation

## 2011-07-01 DIAGNOSIS — N189 Chronic kidney disease, unspecified: Secondary | ICD-10-CM | POA: Insufficient documentation

## 2011-07-01 DIAGNOSIS — R5383 Other fatigue: Secondary | ICD-10-CM

## 2011-07-01 DIAGNOSIS — E109 Type 1 diabetes mellitus without complications: Secondary | ICD-10-CM | POA: Insufficient documentation

## 2011-07-01 DIAGNOSIS — I2510 Atherosclerotic heart disease of native coronary artery without angina pectoris: Secondary | ICD-10-CM

## 2011-07-01 DIAGNOSIS — G4730 Sleep apnea, unspecified: Secondary | ICD-10-CM | POA: Insufficient documentation

## 2011-07-01 DIAGNOSIS — N401 Enlarged prostate with lower urinary tract symptoms: Secondary | ICD-10-CM

## 2011-07-01 DIAGNOSIS — E291 Testicular hypofunction: Secondary | ICD-10-CM

## 2011-07-01 DIAGNOSIS — K648 Other hemorrhoids: Secondary | ICD-10-CM

## 2011-07-01 DIAGNOSIS — G5790 Unspecified mononeuropathy of unspecified lower limb: Secondary | ICD-10-CM

## 2011-07-01 DIAGNOSIS — K439 Ventral hernia without obstruction or gangrene: Secondary | ICD-10-CM | POA: Insufficient documentation

## 2011-07-01 DIAGNOSIS — F4310 Post-traumatic stress disorder, unspecified: Secondary | ICD-10-CM

## 2011-07-01 DIAGNOSIS — R5381 Other malaise: Secondary | ICD-10-CM

## 2011-07-01 DIAGNOSIS — N184 Chronic kidney disease, stage 4 (severe): Secondary | ICD-10-CM

## 2011-07-01 DIAGNOSIS — M549 Dorsalgia, unspecified: Secondary | ICD-10-CM

## 2011-07-01 HISTORY — PX: VENTRAL HERNIA REPAIR: SHX424

## 2011-07-01 SURGERY — REPAIR, HERNIA, VENTRAL
Anesthesia: General | Site: Abdomen | Wound class: Clean

## 2011-07-01 MED ORDER — OXYCODONE HCL 5 MG PO TABS: 5.0000 mg | ORAL_TABLET | ORAL | Status: DC | PRN

## 2011-07-01 MED ORDER — PROPOFOL 10 MG/ML IV EMUL
INTRAVENOUS | Status: DC | PRN
  Administered 2011-07-01: 200 mg via INTRAVENOUS

## 2011-07-01 MED ORDER — ONDANSETRON HCL 4 MG/2ML IJ SOLN
INTRAMUSCULAR | Status: DC | PRN
  Administered 2011-07-01: 4 mg via INTRAVENOUS

## 2011-07-01 MED ORDER — SODIUM CHLORIDE 0.9 % IJ SOLN: 3.0000 mL | Freq: Two times a day (BID) | INTRAMUSCULAR | Status: DC

## 2011-07-01 MED ORDER — HYDROMORPHONE HCL PF 1 MG/ML IJ SOLN: 0.2500 mg | INTRAMUSCULAR | Status: DC | PRN

## 2011-07-01 MED ORDER — 0.9 % SODIUM CHLORIDE (POUR BTL) OPTIME
TOPICAL | Status: DC | PRN
  Administered 2011-07-01: 1000 mL

## 2011-07-01 MED ORDER — SODIUM CHLORIDE 0.9 % IJ SOLN: 3.0000 mL | INTRAMUSCULAR | Status: DC | PRN

## 2011-07-01 MED ORDER — PROMETHAZINE HCL 25 MG/ML IJ SOLN: 12.5000 mg | Freq: Four times a day (QID) | INTRAMUSCULAR | Status: DC | PRN

## 2011-07-01 MED ORDER — FENTANYL CITRATE 0.05 MG/ML IJ SOLN
INTRAMUSCULAR | Status: DC | PRN
  Administered 2011-07-01: 150 ug via INTRAVENOUS

## 2011-07-01 MED ORDER — ACETAMINOPHEN 325 MG PO TABS: 650.0000 mg | ORAL_TABLET | ORAL | Status: DC | PRN

## 2011-07-01 MED ORDER — NEOSTIGMINE METHYLSULFATE 1 MG/ML IJ SOLN
INTRAMUSCULAR | Status: DC | PRN
  Administered 2011-07-01: 4 mg via INTRAVENOUS

## 2011-07-01 MED ORDER — VECURONIUM BROMIDE 10 MG IV SOLR
INTRAVENOUS | Status: DC | PRN
  Administered 2011-07-01: 5 mg via INTRAVENOUS

## 2011-07-01 MED ORDER — GLYCOPYRROLATE 0.2 MG/ML IJ SOLN
INTRAMUSCULAR | Status: DC | PRN
  Administered 2011-07-01: .8 mg via INTRAVENOUS

## 2011-07-01 MED ORDER — ONDANSETRON HCL 4 MG/2ML IJ SOLN: 4.0000 mg | Freq: Four times a day (QID) | INTRAMUSCULAR | Status: DC | PRN

## 2011-07-01 MED ORDER — MORPHINE SULFATE 2 MG/ML IJ SOLN: 1.0000 mg | INTRAMUSCULAR | Status: DC | PRN

## 2011-07-01 MED ORDER — SODIUM CHLORIDE 0.9 % IV SOLN
INTRAVENOUS | Status: DC
  Administered 2011-07-01: 10:00:00 via INTRAVENOUS

## 2011-07-01 MED ORDER — DROPERIDOL 2.5 MG/ML IJ SOLN: 0.6250 mg | INTRAMUSCULAR | Status: DC | PRN

## 2011-07-01 MED ORDER — ACETAMINOPHEN 650 MG RE SUPP: 650.0000 mg | RECTAL | Status: DC | PRN

## 2011-07-01 MED ORDER — BUPIVACAINE-EPINEPHRINE PF 0.25-1:200000 % IJ SOLN
INTRAMUSCULAR | Status: DC | PRN
  Administered 2011-07-01: 30 mL

## 2011-07-01 MED ORDER — HYDROCODONE-ACETAMINOPHEN 5-500 MG PO TABS
1.0000 | ORAL_TABLET | ORAL | Status: DC | PRN
Start: 2011-07-01 — End: 2011-07-20

## 2011-07-01 MED ORDER — SODIUM CHLORIDE 0.9 % IV SOLN
INTRAVENOUS | Status: DC | PRN
  Administered 2011-07-01: 10:00:00 via INTRAVENOUS

## 2011-07-01 MED ORDER — SODIUM CHLORIDE 0.9 % IV SOLN: 250.0000 mL | INTRAVENOUS | Status: DC | PRN

## 2011-07-01 SURGICAL SUPPLY — 47 items
ADH SKN CLS APL DERMABOND .7 (GAUZE/BANDAGES/DRESSINGS) ×1
BLADE SURG ROTATE 9660 (MISCELLANEOUS) IMPLANT
CANISTER SUCTION 2500CC (MISCELLANEOUS) ×2 IMPLANT
CHLORAPREP W/TINT 26ML (MISCELLANEOUS) ×2 IMPLANT
CLOTH BEACON ORANGE TIMEOUT ST (SAFETY) ×2 IMPLANT
COVER SURGICAL LIGHT HANDLE (MISCELLANEOUS) ×2 IMPLANT
DERMABOND ADVANCED (GAUZE/BANDAGES/DRESSINGS) ×1
DERMABOND ADVANCED .7 DNX12 (GAUZE/BANDAGES/DRESSINGS) IMPLANT
DRAPE LAPAROSCOPIC ABDOMINAL (DRAPES) ×2 IMPLANT
DRAPE UTILITY 15X26 W/TAPE STR (DRAPE) ×4 IMPLANT
ELECT CAUTERY BLADE 6.4 (BLADE) ×2 IMPLANT
ELECT REM PT RETURN 9FT ADLT (ELECTROSURGICAL) ×2
ELECTRODE REM PT RTRN 9FT ADLT (ELECTROSURGICAL) ×1 IMPLANT
GLOVE BIO SURGEON STRL SZ8 (GLOVE) ×2 IMPLANT
GLOVE BIOGEL PI IND STRL 7.0 (GLOVE) IMPLANT
GLOVE BIOGEL PI IND STRL 8 (GLOVE) ×1 IMPLANT
GLOVE BIOGEL PI INDICATOR 7.0 (GLOVE) ×2
GLOVE BIOGEL PI INDICATOR 8 (GLOVE) ×2
GLOVE SURG SS PI 6.5 STRL IVOR (GLOVE) ×2 IMPLANT
GOWN STRL NON-REIN LRG LVL3 (GOWN DISPOSABLE) ×6 IMPLANT
KIT BASIN OR (CUSTOM PROCEDURE TRAY) ×2 IMPLANT
KIT ROOM TURNOVER OR (KITS) ×2 IMPLANT
NDL HYPO 25GX1X1/2 BEV (NEEDLE) IMPLANT
NEEDLE HYPO 25GX1X1/2 BEV (NEEDLE) ×2 IMPLANT
NS IRRIG 1000ML POUR BTL (IV SOLUTION) ×2 IMPLANT
PACK GENERAL/GYN (CUSTOM PROCEDURE TRAY) ×2 IMPLANT
PAD ARMBOARD 7.5X6 YLW CONV (MISCELLANEOUS) ×3 IMPLANT
PATCH VENTRAL SMALL 4.3 (Mesh Specialty) ×1 IMPLANT
SPONGE GAUZE 4X4 12PLY (GAUZE/BANDAGES/DRESSINGS) IMPLANT
STAPLER VISISTAT 35W (STAPLE) IMPLANT
SUT MNCRL AB 4-0 PS2 18 (SUTURE) ×1 IMPLANT
SUT NOVA 1 T20/GS 25DT (SUTURE) ×2 IMPLANT
SUT PDS AB 1 TP1 96 (SUTURE) IMPLANT
SUT PROLENE 1 CT (SUTURE) IMPLANT
SUT SILK 2 0 (SUTURE)
SUT SILK 2-0 18XBRD TIE 12 (SUTURE) IMPLANT
SUT SILK 3 0 (SUTURE)
SUT SILK 3-0 18XBRD TIE 12 (SUTURE) IMPLANT
SUT VIC AB 3-0 54X BRD REEL (SUTURE) IMPLANT
SUT VIC AB 3-0 BRD 54 (SUTURE)
SUT VIC AB 3-0 SH 27 (SUTURE) ×2
SUT VIC AB 3-0 SH 27XBRD (SUTURE) IMPLANT
SYR CONTROL 10ML LL (SYRINGE) ×1 IMPLANT
TOWEL OR 17X24 6PK STRL BLUE (TOWEL DISPOSABLE) ×2 IMPLANT
TOWEL OR 17X26 10 PK STRL BLUE (TOWEL DISPOSABLE) ×2 IMPLANT
TRAY FOLEY CATH 14FRSI W/METER (CATHETERS) IMPLANT
WATER STERILE IRR 1000ML POUR (IV SOLUTION) IMPLANT

## 2011-07-01 NOTE — Interval H&P Note (Signed)
History and Physical Interval Note:  07/01/2011 10:11 AM  [name]  has presented today for surgery, with the diagnosis of ventral hernia  The various methods of treatment have been discussed with the patient and family. After consideration of risks, benefits and other options for treatment, the patient has consented to  Procedure(s): HERNIA REPAIR VENTRAL ADULT as a surgical intervention .  The patients' history has been reviewed, patient examined, no change in status, stable for surgery.  I have reviewed the patients' chart and labs.  Questions were answered to the patient's satisfaction.     [name] A.

## 2011-07-01 NOTE — Progress Notes (Signed)
Pt has good thrill and bruit noted in left upper arm...Marland KitchenMarland KitchenMarland Kitchenumbilical incision clean & dry, covered with dermabond.Marland KitchenMarland KitchenMarland Kitchen

## 2011-07-01 NOTE — Preoperative (Signed)
Beta Blockers   Reason not to administer Beta Blockers:Not Applicable 

## 2011-07-01 NOTE — Anesthesia Postprocedure Evaluation (Signed)
Anesthesia Post Note  Patient: [name]  Procedure(s) Performed:  HERNIA REPAIR VENTRAL ADULT  Anesthesia type: general  Patient location: PACU  Post pain: Pain level controlled  Post assessment: Patient's Cardiovascular Status Stable  Last Vitals:  Filed Vitals:   07/01/11 1249  BP: 149/92  Pulse: 79  Temp: 36.1 C  Resp: 20    Post vital signs: Reviewed and stable  Level of consciousness: sedated  Complications: No apparent anesthesia complications

## 2011-07-01 NOTE — H&P (View-Only) (Signed)
Patient ID: [name], male   DOB: 1950-09-26, 61 y.o.   MRN: [id number]  Chief Complaint  Patient presents with  . Other    Eval peri-umbilicial hernia    HPI [name] is a 61 y.o. male.   HPI The patient presents today at the request of Dr. Darryll Capers due to a hernia above his umbilicus. This has been present for at least 7 years. The patient has lost a considerable amount of weight and the hernia is much more prominent. He is beginning because mild discomfort and becoming difficult for the patient to reduce. He denies any recent nausea or vomiting. He is on hemodialysis and doing well. His diabetes is much improved with his weight loss. He denies any nausea vomiting or severe abdominal pain.  Past Medical History  Diagnosis Date  . Coronary atherosclerosis of native coronary artery   . Other malaise and fatigue   . Chest pain, unspecified   . Snoring disorder   . Unspecified essential hypertension   . Personal history of unspecified circulatory disease   . Dysphagia, unspecified   . Internal hemorrhoids without mention of complication   . Hypertrophy of prostate with urinary obstruction and other lower urinary tract symptoms (LUTS)   . Nausea alone   . Gastroparesis   . Backache, unspecified   . Chronic kidney disease, stage III (moderate)   . Posttraumatic stress disorder   . Hemorrhage of rectum and anus   . Allergy   . Diabetes mellitus 2004  . Stroke   . CHF (congestive heart failure)   . Myocardial infarction 2002  . Hyperlipidemia   . Arthritis   . Dizziness   . Headache   . Obesity   . Chills     Past Surgical History  Procedure Date  . Hemorrhoid surgery   . Anal fissurectomy   . Av fistula placement   . Revision of fistula     renal failure    Family History  Problem Relation Age of Onset  . Diabetes Sister   . Heart disease Sister   . Diabetes Maternal Aunt   . Diabetes Maternal Uncle   . Diabetes Paternal Aunt   . Diabetes Paternal  Uncle   . Heart disease    . Heart disease Father   . Heart disease Brother     Social History History  Substance Use Topics  . Smoking status: Never Smoker   . Smokeless tobacco: Never Used  . Alcohol Use: No    No Known Allergies  Current Outpatient Prescriptions  Medication Sig Dispense Refill  . allopurinol (ZYLOPRIM) 100 MG tablet Take 100 mg by mouth 2 (two) times daily.       [name] aspirin 81 MG tablet Take 81 mg by mouth daily.        . clopidogrel (PLAVIX) 75 MG tablet Take 75 mg by mouth daily.        . diazepam (VALIUM) 5 MG tablet Take 1 tablet (5 mg total) by mouth daily as needed.  30 tablet  3  . hydrOXYzine (ATARAX/VISTARIL) 25 MG tablet Take 25 mg by mouth. 1-2 tabs  In pm       . insulin glargine (LANTUS) 100 UNIT/ML injection Inject 90 Units into the skin at bedtime.        . Insulin Pen Needle 31G X 8 MM MISC by Does not apply route. Bd u/f short pen needle 31gx31mm misc(insulin pen needle)--to use 4 times a day with  insulin pen       . linagliptin (TRADJENTA) 5 MG TABS tablet Take 1 tablet (5 mg total) by mouth daily.  30 tablet  0  . rosuvastatin (CRESTOR) 40 MG tablet Take 40 mg by mouth daily.        . solifenacin (VESICARE) 5 MG tablet Take 10 mg by mouth daily.        . Tamsulosin HCl (FLOMAX) 0.4 MG CAPS Take by mouth.        . testosterone cypionate (DEPO-TESTOSTERONE) 200 MG/ML injection Inject into the muscle every 14 (fourteen) days.        [name] torsemide (DEMADEX) 100 MG tablet Take 100 mg by mouth 2 (two) times daily at 10 AM and 5 PM. 1 in am and 1/2 in pm      . traMADol (ULTRAM) 50 MG tablet Take 50 mg by mouth every 6 (six) hours as needed.        . triamcinolone (KENALOG) 0.1 % cream Apply topically 2 (two) times daily.          Review of Systems Review of Systems  Constitutional: Positive for unexpected weight change. Negative for fever and fatigue.  HENT: Negative.   Eyes: Negative.   Respiratory: Negative.   Cardiovascular: Negative.     Gastrointestinal: Negative.   Genitourinary: Negative.   Musculoskeletal: Negative.   Neurological: Negative.   Hematological: Negative.   Psychiatric/Behavioral: Negative.     Blood pressure 146/98, pulse 72, temperature 97.8 F (36.6 C), temperature source Temporal, resp. rate 18, height 5\' 10"  (1.778 m), weight 240 lb 6.4 oz (109.045 kg).  Physical Exam Physical Exam  Constitutional: He is oriented to person, place, and time. He appears well-developed and well-nourished.  HENT:  Head: Normocephalic and atraumatic.  Eyes: EOM are normal. Pupils are equal, round, and reactive to light.  Neck: Normal range of motion. Neck supple. No tracheal deviation present.  Cardiovascular: Normal rate and regular rhythm.  Exam reveals no gallop and no friction rub.   Murmur heard. Pulmonary/Chest: Effort normal and breath sounds normal. No stridor.  Abdominal: Soft. Bowel sounds are normal. He exhibits no distension. There is no tenderness.    Musculoskeletal: Normal range of motion.       Arms: Neurological: He is alert and oriented to person, place, and time. He has normal strength. He is not disoriented. Gait normal. GCS eye subscore is 4. GCS verbal subscore is 5. GCS motor subscore is 6.  Skin: Skin is warm and dry.  Psychiatric: He has a normal mood and affect. His speech is normal and behavior is normal.    Data Reviewed Dr York Ram notes Assessment    2 CM ventral hernia symptomatic    Plan    I had a lengthy discussion with the patient today about options of repair versus observation as well as the pros and cons of each. He has multiple medical problems including renal failure brought he is well managed and well-controlled and doing well with dialysis. The hernia is more symptomatic and he would like it repaired I think is reasonable. Risks, benefits and nonoperative treatments discussed. He would like to proceed with repair of his 2 cm ventral hernia just above his  umbilicus.The risk of hernia repair include bleeding,  Infection,   Recurrence of the hernia,  Mesh use, chronic pain,  Organ injury,  Bowel injury,  Bladder injury,   nerve injury with numbness around the incision,  Death,  and worsening of preexisting  medical  problems.  The alternatives to surgery have been discussed as well..  Long term expectations of both operative and non operative treatments have been discussed.   The patient agrees to proceed.       Gaile Allmon A. 06/05/2011, 10:56 AM

## 2011-07-01 NOTE — Op Note (Signed)
Preoperative diagnosis: 2 cm ventral hernia symptomatic  Postoperative diagnosis: Same  Procedure: Repair of 2 cm ventral hernia with mesh  Surgeon: Harriette Bouillon M.D.  Anesthesia: Gen. Endotracheal anesthesia with 0.25% Sensorcaine with epinephrine  Drains: None  EBL: Less than 50 cc  Specimens none  IV fluids: Approximately 500 cc crystalloid  Drains none  Indications for procedure: The patient is a 61 year old male with a symptomatic 2 cm ventral hernia just above his umbilicus. This is causing discomfort and he wished to have it fixed. Risks, benefits, and alternative therapies were discussed.The risk of hernia repair include bleeding,  Infection,   Recurrence of the hernia,  Mesh use, chronic pain,  Organ injury,  Bowel injury,  Bladder injury,   nerve injury with numbness around the incision,  Death,  and worsening of preexisting  medical problems.  The alternatives to surgery have been discussed as well..  Long term expectations of both operative and non operative treatments have been discussed.   The patient agrees to proceed.  Description of procedure: The patient was seen in the holding area. Questions are answered. He is taken back to the operating room in the supine position. After induction of general endotracheal anesthesia, the abdomen was prepped and draped in a sterile fashion. Timeout was done. He received IV antibiotics. The hernia defect was just above the umbilicus and measured 2 cm. Incision was made in the midline above the umbilicus. Dissection was carried down hernia sac was identified. The fascia was cleaned off around the hernia sac in the axial defect measured approximately 15 mm. Pre-peritoneal fat was in the hernia sac and this was reduced. A 4.3 by 4.3 cm proceed mesh was used. This was secured to the undersurface of the fascia with #1 Novafil suture. The fascia was closed over the mesh with #1 Novafil. The wound was irrigated. Deep tissue closed with 3-0 Vicryl.  4 Monocryl used to close skin. Dermabond applied. All final counts sponge, needles and this was found to be correct. The patient was awoke taken to recovery in satisfactory condition.

## 2011-07-01 NOTE — Anesthesia Preprocedure Evaluation (Addendum)
Anesthesia Evaluation  Patient identified by MRN, date of birth, ID band Patient awake    Reviewed: Allergy & Precautions, H&P , NPO status , Patient's Chart, lab work & pertinent test results, reviewed documented beta blocker date and time   Airway Mallampati: II TM Distance: >3 FB     Dental  (+) Teeth Intact and Dental Advisory Given   Pulmonary sleep apnea ,  clear to auscultation  Pulmonary exam normal       Cardiovascular hypertension, - angina+ CAD, + Past MI and +CHF Regular Normal    Neuro/Psych  Headaches, PSYCHIATRIC DISORDERS CVA, Residual Symptoms    GI/Hepatic negative GI ROS, Neg liver ROS,   Endo/Other  Diabetes mellitus-, Well Controlled, Type 1, Insulin Dependent  Renal/GU CRF and DialysisRenal disease     Musculoskeletal   Abdominal   Peds  Hematology   Anesthesia Other Findings   Reproductive/Obstetrics                           Anesthesia Physical Anesthesia Plan  ASA: III  Anesthesia Plan: General   Post-op Pain Management:    Induction: Intravenous  Airway Management Planned: LMA and Oral ETT  Additional Equipment:   Intra-op Plan:   Post-operative Plan: Extubation in OR  Informed Consent: I have reviewed the patients History and Physical, chart, labs and discussed the procedure including the risks, benefits and alternatives for the proposed anesthesia with the patient or authorized representative who has indicated his/her understanding and acceptance.   Dental advisory given and Dental Advisory Given  Plan Discussed with: CRNA, Anesthesiologist and Surgeon  Anesthesia Plan Comments:        Anesthesia Quick Evaluation

## 2011-07-01 NOTE — Anesthesia Procedure Notes (Signed)
Procedure Name: Intubation Date/Time: 07/01/2011 10:26 AM Performed by: Carmela Rima Pre-anesthesia Checklist: Patient identified, Timeout performed, Emergency Drugs available, Suction available and Patient being monitored Patient Re-evaluated:Patient Re-evaluated prior to inductionOxygen Delivery Method: Circle System Utilized Preoxygenation: Pre-oxygenation with 100% oxygen Intubation Type: IV induction Ventilation: Mask ventilation without difficulty Laryngoscope Size: Mac and 3 Grade View: Grade I Tube type: Oral Tube size: 7.5 mm Number of attempts: 1 Placement Confirmation: ETT inserted through vocal cords under direct vision Secured at: 24 cm Tube secured with: Tape Dental Injury: Teeth and Oropharynx as per pre-operative assessment

## 2011-07-01 NOTE — Transfer of Care (Signed)
Immediate Anesthesia Transfer of Care Note  Patient: [name]  Procedure(s) Performed:  HERNIA REPAIR VENTRAL ADULT  Patient Location: PACU  Anesthesia Type: General  Level of Consciousness: awake, alert  and oriented  Airway & Oxygen Therapy: Patient Spontanous Breathing and Patient connected to face mask oxygen  Post-op Assessment: Report given to PACU RN  Post vital signs: Reviewed and stable  Complications: No apparent anesthesia complications

## 2011-07-02 LAB — POCT I-STAT 4, (NA,K, GLUC, HGB,HCT)
Glucose, Bld: 164 mg/dL — ABNORMAL HIGH (ref 70–99)
HCT: 47 % (ref 39.0–52.0)
Hemoglobin: 16 g/dL (ref 13.0–17.0)

## 2011-07-03 ENCOUNTER — Encounter (HOSPITAL_COMMUNITY): Payer: Self-pay | Admitting: Surgery

## 2011-07-10 ENCOUNTER — Ambulatory Visit: Payer: Self-pay | Admitting: Cardiology

## 2011-07-15 ENCOUNTER — Ambulatory Visit (INDEPENDENT_AMBULATORY_CARE_PROVIDER_SITE_OTHER): Payer: Medicare Other | Admitting: Internal Medicine

## 2011-07-15 ENCOUNTER — Encounter: Payer: Self-pay | Admitting: Internal Medicine

## 2011-07-15 VITALS — BP 140/78 | HR 76 | Temp 98.2°F | Resp 18 | Ht 70.0 in | Wt 232.0 lb

## 2011-07-15 DIAGNOSIS — E785 Hyperlipidemia, unspecified: Secondary | ICD-10-CM

## 2011-07-15 DIAGNOSIS — E668 Other obesity: Secondary | ICD-10-CM

## 2011-07-15 DIAGNOSIS — IMO0001 Reserved for inherently not codable concepts without codable children: Secondary | ICD-10-CM

## 2011-07-15 DIAGNOSIS — T887XXA Unspecified adverse effect of drug or medicament, initial encounter: Secondary | ICD-10-CM

## 2011-07-15 DIAGNOSIS — E349 Endocrine disorder, unspecified: Secondary | ICD-10-CM

## 2011-07-15 DIAGNOSIS — I10 Essential (primary) hypertension: Secondary | ICD-10-CM

## 2011-07-15 NOTE — Patient Instructions (Signed)
The patient is instructed to continue all medications as prescribed. Schedule followup with check out clerk upon leaving the clinic  

## 2011-07-15 NOTE — Progress Notes (Signed)
Subjective:    Patient ID: [name], male    DOB: 12-09-1950, 61 y.o.   MRN: [id number]  HPI Since the patient has been on dialysis he is feeling significantly better his weight continues to gradually decline with exercise and and dietary control.  He is followed by [place] for his diabetes and for a history of gout as well as testosterone replacement.  He also uses a CPAP for sleep apnea. His CBGs are ranging between 80 and 120. He believes that his A1c is around 6 at the dialysis center we do not have access to that number    Review of Systems  Constitutional: Negative for fever and fatigue.  HENT: Negative for hearing loss, congestion, neck pain and postnasal drip.   Eyes: Negative for discharge, redness and visual disturbance.  Respiratory: Negative for cough, shortness of breath and wheezing.   Cardiovascular: Negative for leg swelling.  Gastrointestinal: Negative for abdominal pain, constipation and abdominal distention.  Genitourinary: Negative for urgency and frequency.  Musculoskeletal: Negative for joint swelling and arthralgias.  Skin: Negative for color change and rash.  Neurological: Negative for weakness and light-headedness.  Hematological: Negative for adenopathy.  Psychiatric/Behavioral: Negative for behavioral problems.   Past Medical History  Diagnosis Date  . Coronary atherosclerosis of native coronary artery   . Other malaise and fatigue   . Chest pain, unspecified   . Other dyspnea and respiratory abnormality   . Personal history of unspecified circulatory disease   . Dysphagia, unspecified   . Internal hemorrhoids without mention of complication   . Hypertrophy of prostate with urinary obstruction and other lower urinary tract symptoms (LUTS)   . Nausea alone   . Gastroparesis   . Backache, unspecified   . Posttraumatic stress disorder   . Hemorrhage of rectum and anus   . Allergy   . Diabetes mellitus 2004  . CHF (congestive heart failure)   .  Hyperlipidemia   . Arthritis   . Dizziness   . Headache   . Obesity   . Chills   . Complication of anesthesia     01/2011 could not eat,, hospt x2, was placed on hd and cleared up  . Myocardial infarction 2002  . Stroke     2007  . Unspecified essential hypertension     hx htn   . Chronic kidney disease, stage III (moderate)     HD T- TH-SAT  . Sleep apnea     USES CPAP   . Angina     LAST HAD CP 2 MO AGO  DR WALL [place]    History   Social History  . Marital Status: Married    Spouse Name: N/A    Number of Children: N/A  . Years of Education: N/A   Occupational History  . disabled    Social History Main Topics  . Smoking status: Never Smoker   . Smokeless tobacco: Never Used  . Alcohol Use: No  . Drug Use: No  . Sexually Active: Not Currently   Other Topics Concern  . Not on file   Social History Narrative  . No narrative on file    Past Surgical History  Procedure Date  . Hemorrhoid surgery   . Anal fissurectomy   . Av fistula placement   . Revision of fistula     renal failure  . Cardiac catheterization     2005 DR BRODIE   . Ventral hernia repair 07/01/2011    Procedure: HERNIA REPAIR VENTRAL  ADULT;  Surgeon: Clovis Pu. Cornett, MD;  Location: [facility] OR;  Service: General;  Laterality: N/A;    Family History  Problem Relation Age of Onset  . Diabetes Sister   . Heart disease Sister   . Diabetes Maternal Aunt   . Diabetes Maternal Uncle   . Diabetes Paternal Aunt   . Diabetes Paternal Uncle   . Heart disease    . Heart disease Father   . Heart disease Brother     No Known Allergies  Current Outpatient Prescriptions on File Prior to Visit  Medication Sig Dispense Refill  . allopurinol (ZYLOPRIM) 100 MG tablet Take 100 mg by mouth 2 (two) times daily.       [name] aspirin 81 MG tablet Take 81 mg by mouth daily.        . clopidogrel (PLAVIX) 75 MG tablet Take 75 mg by mouth daily.        . diazepam (VALIUM) 5 MG tablet Take 5 mg by mouth daily as  needed.      [name] HYDROcodone-acetaminophen (VICODIN) 5-500 MG per tablet Take 1 tablet by mouth every 4 (four) hours as needed for pain.  30 tablet  0  . insulin glargine (LANTUS) 100 UNIT/ML injection Inject 90 Units into the skin at bedtime.        . Insulin Pen Needle 31G X 8 MM MISC by Does not apply route. Bd u/f short pen needle 31gx14mm misc(insulin pen needle)--to use 4 times a day with insulin pen       . linagliptin (TRADJENTA) 5 MG TABS tablet Take 5 mg by mouth daily.      . rosuvastatin (CRESTOR) 40 MG tablet Take 40 mg by mouth daily.        . solifenacin (VESICARE) 5 MG tablet Take 5 mg by mouth daily.       . Tamsulosin HCl (FLOMAX) 0.4 MG CAPS Take 0.4 mg by mouth daily.       [name] testosterone cypionate (DEPO-TESTOSTERONE) 200 MG/ML injection Inject into the muscle every 14 (fourteen) days.        . traMADol (ULTRAM) 50 MG tablet Take 50 mg by mouth every 6 (six) hours as needed.        . triamcinolone (KENALOG) 0.1 % cream Apply topically 2 (two) times daily.          BP 140/78  Pulse 76  Temp 98.2 F (36.8 C)  Resp 18  Ht 5\' 10"  (1.778 m)  Wt 232 lb (105.235 kg)  BMI 33.29 kg/m2       Objective:   Physical Exam  Nursing note and vitals reviewed. Constitutional: He appears well-developed and well-nourished.  HENT:  Head: Normocephalic and atraumatic.  Eyes: Conjunctivae are normal. Pupils are equal, round, and reactive to light.  Neck: Normal range of motion. Neck supple.  Cardiovascular: Normal rate and regular rhythm.   Pulmonary/Chest: Effort normal and breath sounds normal.  Abdominal: Soft. Bowel sounds are normal.          Assessment & Plan:  Patient's blood pressure is well controlled he continues to do well at dialysis.  His diabetes is well controlled on his current medication with stable A1c in good CBGs. He is currently on Crestor 40 mg by mouth daily he is on aspirin for anticoagulation and he is on a combination of Lantus and Tragenta  He  reports no hypoglycemic  Episodes  He has done well postoperatively and has a followup appointment with his surgeon  next week

## 2011-07-16 ENCOUNTER — Other Ambulatory Visit: Payer: Medicare Other

## 2011-07-20 ENCOUNTER — Encounter (INDEPENDENT_AMBULATORY_CARE_PROVIDER_SITE_OTHER): Payer: Self-pay | Admitting: Surgery

## 2011-07-20 ENCOUNTER — Ambulatory Visit (INDEPENDENT_AMBULATORY_CARE_PROVIDER_SITE_OTHER): Payer: Medicare Other | Admitting: Surgery

## 2011-07-20 VITALS — BP 140/80 | HR 80 | Temp 98.2°F | Resp 12 | Ht 70.0 in | Wt 232.0 lb

## 2011-07-20 DIAGNOSIS — Z9889 Other specified postprocedural states: Secondary | ICD-10-CM

## 2011-07-20 NOTE — Patient Instructions (Signed)
Resume full activity in 2 weeks.  

## 2011-07-20 NOTE — Progress Notes (Signed)
NAME: [name]                                            DOB: 09-03-1950 DATE: 07/20/2011                                                  MRN: [id number]  CC: Post op   HPI: This patient comes in for post op follow-up.Heunderwent ventral hernia repair with mesh on 07/01/2011 He feels that he is doing well.  PE: General: The patient appears to be healthy, NAD Wound clean dry intact  DATA REVIEWED:  IMPRESSION: The patient is doing well S/P ventral hernia    PLAN: Return prn.  Resume full activity in 2 weeks

## 2011-07-24 ENCOUNTER — Other Ambulatory Visit: Payer: Self-pay | Admitting: Internal Medicine

## 2011-07-24 MED ORDER — DIAZEPAM 5 MG PO TABS: 5.0000 mg | ORAL_TABLET | Freq: Every day | ORAL | Status: DC | PRN

## 2011-07-24 NOTE — Telephone Encounter (Signed)
[facility]. Pt would like a 30 day supply call into [facility]

## 2011-07-29 ENCOUNTER — Other Ambulatory Visit: Payer: Medicare Other

## 2011-07-29 LAB — BASIC METABOLIC PANEL
CO2: 33 mEq/L — ABNORMAL HIGH (ref 19–32)
Calcium: 9.1 mg/dL (ref 8.4–10.5)
Glucose, Bld: 214 mg/dL — ABNORMAL HIGH (ref 70–99)
Sodium: 137 mEq/L (ref 135–145)

## 2011-07-29 LAB — HEPATIC FUNCTION PANEL
AST: 14 U/L (ref 0–37)
Alkaline Phosphatase: 65 U/L (ref 39–117)
Total Bilirubin: 0.4 mg/dL (ref 0.3–1.2)

## 2011-07-29 LAB — LIPID PANEL: Total CHOL/HDL Ratio: 5

## 2011-07-29 NOTE — Progress Notes (Signed)
Addended by: Rita Ohara R on: 07/29/2011 01:22 PM   Modules accepted: Orders

## 2011-07-30 LAB — TESTOSTERONE, FREE, TOTAL, SHBG
Sex Hormone Binding: 27 nmol/L (ref 13–71)
Testosterone: 232.77 ng/dL — ABNORMAL LOW (ref 250–890)

## 2011-10-14 ENCOUNTER — Ambulatory Visit: Payer: Medicare Other | Admitting: Internal Medicine

## 2011-10-26 ENCOUNTER — Ambulatory Visit (INDEPENDENT_AMBULATORY_CARE_PROVIDER_SITE_OTHER): Payer: Medicare Other | Admitting: Internal Medicine

## 2011-10-26 ENCOUNTER — Encounter: Payer: Self-pay | Admitting: Internal Medicine

## 2011-10-26 VITALS — BP 122/70 | HR 72 | Temp 98.2°F | Resp 72 | Ht 69.75 in | Wt 224.0 lb

## 2011-10-26 DIAGNOSIS — I10 Essential (primary) hypertension: Secondary | ICD-10-CM

## 2011-10-26 DIAGNOSIS — N184 Chronic kidney disease, stage 4 (severe): Secondary | ICD-10-CM

## 2011-10-26 DIAGNOSIS — G4733 Obstructive sleep apnea (adult) (pediatric): Secondary | ICD-10-CM

## 2011-10-26 DIAGNOSIS — E119 Type 2 diabetes mellitus without complications: Secondary | ICD-10-CM

## 2011-10-26 MED ORDER — OMEGA 3 1000 MG PO CAPS: 1.0000 | ORAL_CAPSULE | Freq: Every day | ORAL | Status: DC

## 2011-10-26 NOTE — Patient Instructions (Signed)
The patient is instructed to continue all medications as prescribed. Schedule followup with check out clerk upon leaving the clinic  

## 2011-10-26 NOTE — Progress Notes (Signed)
  Subjective:    Patient ID: [name], male    DOB: [birth year], 61 y.o.   MRN: [id number]  HPI  The patient is a 61 year old male who is followed for chronic renal failure on dialysis hypertension hyperlipidemia and adult onset diabetes.  He is significantly improved and controlled with his blood pressures diabetes with dialysis he states that his A1c is down to a single digit and at that he would bring those records to his next office visit his weight is down his primary complaint today is persistent per for neuropathy and we discussed the use of omega-3 supplements and peripheral neuropathy  Review of Systems  Constitutional: Negative for fever and fatigue.  HENT: Negative for hearing loss, congestion, neck pain and postnasal drip.   Eyes: Negative for discharge, redness and visual disturbance.  Respiratory: Negative for cough, shortness of breath and wheezing.   Cardiovascular: Negative for leg swelling.  Gastrointestinal: Negative for abdominal pain, constipation and abdominal distention.  Genitourinary: Negative for urgency and frequency.  Musculoskeletal: Negative for joint swelling and arthralgias.  Skin: Negative for color change and rash.  Neurological: Negative for weakness and light-headedness.  Hematological: Negative for adenopathy.  Psychiatric/Behavioral: Negative for behavioral problems.       Objective:   Physical Exam  Nursing note and vitals reviewed. Constitutional: He appears well-developed and well-nourished.  HENT:  Head: Normocephalic and atraumatic.  Eyes: Conjunctivae are normal. Pupils are equal, round, and reactive to light.  Neck: Normal range of motion. Neck supple.  Cardiovascular: Normal rate and regular rhythm.   Pulmonary/Chest: Effort normal and breath sounds normal.  Abdominal: Soft. Bowel sounds are normal.          Assessment & Plan:  Stable blood pressure on dialysis.  He has begun a phosphate binder and Calcitrol for calcium  and phosphorus metabolism.  He has persistent peripheral neuropathy for which we're using a omega-3 supplement.  He also continues to use tramadol as needed for pain. We are told that the continued weight loss will result in the ability to titrate some of his medicines

## 2011-12-28 ENCOUNTER — Encounter: Payer: Self-pay | Admitting: Family

## 2011-12-28 ENCOUNTER — Telehealth: Payer: Self-pay | Admitting: Internal Medicine

## 2011-12-28 ENCOUNTER — Ambulatory Visit (INDEPENDENT_AMBULATORY_CARE_PROVIDER_SITE_OTHER): Payer: Medicare Other | Admitting: Family

## 2011-12-28 VITALS — BP 156/90 | HR 51 | Temp 98.1°F | Wt 226.0 lb

## 2011-12-28 DIAGNOSIS — M25552 Pain in left hip: Secondary | ICD-10-CM

## 2011-12-28 DIAGNOSIS — M25559 Pain in unspecified hip: Secondary | ICD-10-CM

## 2011-12-28 DIAGNOSIS — E119 Type 2 diabetes mellitus without complications: Secondary | ICD-10-CM

## 2011-12-28 MED ORDER — METHYLPREDNISOLONE 4 MG PO KIT: PACK | ORAL | Status: AC

## 2011-12-28 MED ORDER — METHYLPREDNISOLONE ACETATE 80 MG/ML IJ SUSP
80.0000 mg | Freq: Once | INTRAMUSCULAR | Status: AC
  Administered 2011-12-28: 80 mg via INTRAMUSCULAR

## 2011-12-28 NOTE — Progress Notes (Signed)
Subjective:    Patient ID: [name], male    DOB: [birth year], 61 y.o.   MRN: [id number]  HPI 61 year old African American male, patient of Dr. Lovell Sheehan is in today with complaints of pain in his left hip and left leg x2-3 weeks. Rates the pain a 10 out of 10, describes it as constant, that radiates down the left leg. In the past he's responded well to tramadol and Valium that is not helping. He denies any recent injury. He has a history of degenerative disc disease and low back pain. At one point he was getting injections into his back to help with those symptoms, but has not had one in about 3-4 years.   Review of Systems  Constitutional: Negative.   HENT: Negative.   Eyes: Negative.   Cardiovascular: Negative.   Gastrointestinal: Negative.   Genitourinary: Negative.   Musculoskeletal: Positive for arthralgias.       Left hip and leg pain.  Skin: Negative.   Neurological: Negative.   Hematological: Negative.   Psychiatric/Behavioral: Negative.    Past Medical History  Diagnosis Date  . Coronary atherosclerosis of native coronary artery   . Other malaise and fatigue   . Chest pain, unspecified   . Other dyspnea and respiratory abnormality   . Personal history of unspecified circulatory disease   . Dysphagia, unspecified   . Internal hemorrhoids without mention of complication   . Hypertrophy of prostate with urinary obstruction and other lower urinary tract symptoms (LUTS)   . Nausea alone   . Gastroparesis   . Backache, unspecified   . Posttraumatic stress disorder   . Hemorrhage of rectum and anus   . Allergy   . Diabetes mellitus 2004  . CHF (congestive heart failure)   . Hyperlipidemia   . Arthritis   . Dizziness   . Headache   . Obesity   . Chills   . Complication of anesthesia     01/2011 could not eat,, hospt x2, was placed on hd and cleared up  . Myocardial infarction 2002  . Stroke     2007  . Unspecified essential hypertension     hx htn   . Chronic  kidney disease, stage III (moderate)     HD T- TH-SAT  . Sleep apnea     USES CPAP   . Angina     LAST HAD CP 2 MO AGO  DR WALL [place]    History   Social History  . Marital Status: Married    Spouse Name: N/A    Number of Children: N/A  . Years of Education: N/A   Occupational History  . disabled    Social History Main Topics  . Smoking status: Never Smoker   . Smokeless tobacco: Never Used  . Alcohol Use: No  . Drug Use: No  . Sexually Active: Not Currently   Other Topics Concern  . Not on file   Social History Narrative  . No narrative on file    Past Surgical History  Procedure Date  . Hemorrhoid surgery   . Anal fissurectomy   . Av fistula placement   . Revision of fistula     renal failure  . Cardiac catheterization     2005 DR BRODIE   . Ventral hernia repair 07/01/2011    Procedure: HERNIA REPAIR VENTRAL ADULT;  Surgeon: Clovis Pu. Cornett, MD;  Location: [facility] OR;  Service: General;  Laterality: N/A;    Family History  Problem Relation Age  of Onset  . Diabetes Sister   . Heart disease Sister   . Diabetes Maternal Aunt   . Diabetes Maternal Uncle   . Diabetes Paternal Aunt   . Diabetes Paternal Uncle   . Heart disease    . Heart disease Father   . Heart disease Brother     No Known Allergies  Current Outpatient Prescriptions on File Prior to Visit  Medication Sig Dispense Refill  . allopurinol (ZYLOPRIM) 100 MG tablet Take 100 mg by mouth 2 (two) times daily.       [name] aspirin 81 MG tablet Take 81 mg by mouth daily.        . calcium acetate, Phos Binder, (PHOSLYRA) 667 MG/5ML SOLN Take by mouth 3 (three) times daily with meals.      . clopidogrel (PLAVIX) 75 MG tablet Take 75 mg by mouth daily.        . diazepam (VALIUM) 5 MG tablet Take 1 tablet (5 mg total) by mouth daily as needed.  30 tablet  1  . insulin glargine (LANTUS) 100 UNIT/ML injection Inject 90 Units into the skin at bedtime.        . Insulin Pen Needle 31G X 8 MM MISC by Does not  apply route. Bd u/f short pen needle 31gx29mm misc(insulin pen needle)--to use 4 times a day with insulin pen       . linagliptin (TRADJENTA) 5 MG TABS tablet Take 5 mg by mouth daily.      . multivitamin (RENA-VIT) TABS tablet Take 1 tablet by mouth daily.      . OMEGA 3 1000 MG CAPS Take 1 capsule (1,000 mg total) by mouth daily.  90 each    . rosuvastatin (CRESTOR) 40 MG tablet Take 40 mg by mouth daily.        . solifenacin (VESICARE) 5 MG tablet Take 5 mg by mouth daily.       . Tamsulosin HCl (FLOMAX) 0.4 MG CAPS Take 0.4 mg by mouth daily.       [name] testosterone cypionate (DEPO-TESTOSTERONE) 200 MG/ML injection Inject into the muscle every 14 (fourteen) days.        . traMADol (ULTRAM) 50 MG tablet Take 50 mg by mouth every 6 (six) hours as needed.        . triamcinolone (KENALOG) 0.1 % cream Apply topically 2 (two) times daily.         No current facility-administered medications on file prior to visit.    BP 156/90  Pulse 51  Temp 98.1 F (36.7 C) (Oral)  Wt 226 lb (102.513 kg)  SpO2 94%chart    Objective:   Physical Exam  Constitutional: He is oriented to person, place, and time. He appears well-developed and well-nourished.  Neck: Normal range of motion. Neck supple.  Cardiovascular: Normal rate, regular rhythm and normal heart sounds.   Pulmonary/Chest: Effort normal and breath sounds normal.  Abdominal: Soft. Bowel sounds are normal.  Musculoskeletal: Normal range of motion.       Tenderness to palpation of the left greater trochanter. He has very limited range of motion to his left hip due to pain. Pedal pulses are 2 out of 2 skin is warm and dry. Popliteal pulses 2/2  Neurological: He is alert and oriented to person, place, and time.  Skin: Skin is warm and dry.  Psychiatric: He has a normal mood and affect.          Assessment & Plan:  Assessment: Left hip  pain, left leg pain, type 2 diabetes  Plan: Depo-Medrol 80 mg IM x1. Medrol Dosepak as directed over the  next 6 days. Consider an MRI. X-ray of the left hip. I believe the origin of his pain is his left. However, we'll consider an MRI of the low back of his symptoms persist. As of note, patient's unable to tolerate narcotic medications.

## 2011-12-28 NOTE — Patient Instructions (Signed)
Hip Pain  The hips join the upper legs to the lower pelvis. The bones, cartilage, tendons, and muscles of the hip joint perform a lot of work each day holding your body weight and allowing you to move around.  Hip pain is a common symptom. It can range from a minor ache to severe pain on 1 or both hips. Pain may be felt on the inside of the hip joint near the groin, or the outside near the buttocks and upper thigh. There may be swelling or stiffness as well. It occurs more often when a person walks or performs activity. There are many reasons hip pain can develop.  CAUSES   It is important to work with your caregiver to identify the cause since many conditions can impact the bones, cartilage, muscles, and tendons of the hips. Causes for hip pain include:   Broken (fractured) bones.   Separation of the thighbone from the hip socket (dislocation).   Torn cartilage of the hip joint.   Swelling (inflammation) of a tendon (tendonitis), the sac within the hip joint (bursitis), or a joint.   A weakening in the abdominal wall (hernia), affecting the nerves to the hip.   Arthritis in the hip joint or lining of the hip joint.   Pinched nerves in the back, hip, or upper thigh.   A bulging disc in the spine (herniated disc).   Rarely, bone infection or cancer.  DIAGNOSIS   The location of your hip pain will help your caregiver understand what may be causing the pain. A diagnosis is based on your medical history, your symptoms, results from your physical exam, and results from diagnostic tests. Diagnostic tests may include X-ray exams, a computerized magnetic scan (magnetic resonance imaging, MRI), or bone scan.  TREATMENT   Treatment will depend on the cause of your hip pain. Treatment may include:   Limiting activities and resting until symptoms improve.   Crutches or other walking supports (a cane or brace).   Ice, elevation, and compression.   Physical therapy or home exercises.   Shoe inserts or special  shoes.   Losing weight.   Medications to reduce pain.   Undergoing surgery.  HOME CARE INSTRUCTIONS    Only take over-the-counter or prescription medicines for pain, discomfort, or fever as directed by your caregiver.   Put ice on the injured area:   Put ice in a plastic bag.   Place a towel between your skin and the bag.   Leave the ice on for 15 to 20 minutes at a time, 3 to 4 times a day.   Keep your leg raised (elevated) when possible to lessen swelling.   Avoid activities that cause pain.   Follow specific exercises as directed by your caregiver.   Sleep with a pillow between your legs on your most comfortable side.   Record how often you have hip pain, the location of the pain, and what it feels like. This information may be helpful to you and your caregiver.   Ask your caregiver about returning to work or sports and whether you should drive.   Follow up with your caregiver for further exams, therapy, or testing as directed.  SEEK MEDICAL CARE IF:    Your pain or swelling continues or worsens after 1 week.   You are feeling unwell or have chills.   You have increasing difficulty with walking.   You have a loss of sensation or other new symptoms.   You have questions   or concerns.  SEEK IMMEDIATE MEDICAL CARE IF:    You cannot put weight on the affected hip.   You have fallen.   You have a sudden increase in pain and swelling in your hip.   You have a fever.  MAKE SURE YOU:    Understand these instructions.   Will watch your condition.   Will get help right away if you are not doing well or get worse.  Document Released: 10/29/2009 Document Revised: 04/30/2011 Document Reviewed: 10/29/2009  [employer] Patient Information 2012 [employer]

## 2011-12-28 NOTE — Telephone Encounter (Signed)
Caller: [name]/Patient; Primary Care Physician: Darryll Capers; Juliann Pulse number: [phone]; ; ; Emergent Call regarding Severe Pain in the left Leg. Pt Is On Dialysis.  Onset of pain 3 weeks ago, but pain has worsened over past 3 sessions of dialysis, and on arising AM 12/26/11 notes it is very painful to bear weight on the left leg.  States discussed this with Dr. Lovell Sheehan and he was considering doing an MRI, and patient would like to pursue this.  States he is not sleeping due to pain, and does not know how much tramedol he can safely take.  Pain radiates from top of left hip down the thigh to the top of the left foot.  Per protocol, emergent symptoms denied; advised appt within 24 hours.   Appointment sched 12/28/11 0945 with Ms. Orvan Falconer.

## 2012-01-07 ENCOUNTER — Telehealth: Payer: Self-pay | Admitting: Family Medicine

## 2012-01-07 NOTE — Telephone Encounter (Signed)
Notification from Radiology: this pt did not complete the hip xray ordered on 12/28/11.

## 2012-01-12 ENCOUNTER — Other Ambulatory Visit: Payer: Self-pay | Admitting: Sports Medicine

## 2012-01-12 DIAGNOSIS — M549 Dorsalgia, unspecified: Secondary | ICD-10-CM

## 2012-01-13 ENCOUNTER — Ambulatory Visit: Payer: Medicare Other | Admitting: Internal Medicine

## 2012-01-14 ENCOUNTER — Telehealth: Payer: Self-pay | Admitting: Internal Medicine

## 2012-01-14 NOTE — Telephone Encounter (Signed)
Patient called stating that he would like to be referred to a Mental Health professional for depression and some indifferent thoughts(per pt non-suicidal). Patient states he has a sense of hopelessness due to feeling overwhelming with bills and illnesses. Please assist.

## 2012-01-15 ENCOUNTER — Other Ambulatory Visit: Payer: Self-pay | Admitting: Internal Medicine

## 2012-01-15 DIAGNOSIS — F32A Depression, unspecified: Secondary | ICD-10-CM

## 2012-01-15 DIAGNOSIS — F329 Major depressive disorder, single episode, unspecified: Secondary | ICD-10-CM

## 2012-01-15 NOTE — Telephone Encounter (Signed)
Referral to terri for pt to see susan

## 2012-01-20 ENCOUNTER — Ambulatory Visit (INDEPENDENT_AMBULATORY_CARE_PROVIDER_SITE_OTHER): Payer: 59 | Admitting: Licensed Clinical Social Worker

## 2012-01-20 DIAGNOSIS — F411 Generalized anxiety disorder: Secondary | ICD-10-CM

## 2012-01-20 DIAGNOSIS — F4310 Post-traumatic stress disorder, unspecified: Secondary | ICD-10-CM

## 2012-01-20 DIAGNOSIS — F331 Major depressive disorder, recurrent, moderate: Secondary | ICD-10-CM

## 2012-01-22 ENCOUNTER — Encounter: Payer: Self-pay | Admitting: Internal Medicine

## 2012-01-22 ENCOUNTER — Ambulatory Visit (INDEPENDENT_AMBULATORY_CARE_PROVIDER_SITE_OTHER): Payer: Medicare Other | Admitting: Internal Medicine

## 2012-01-22 VITALS — BP 120/86 | HR 58 | Temp 98.7°F | Wt 218.0 lb

## 2012-01-22 DIAGNOSIS — E119 Type 2 diabetes mellitus without complications: Secondary | ICD-10-CM

## 2012-01-22 DIAGNOSIS — Z9181 History of falling: Secondary | ICD-10-CM

## 2012-01-22 DIAGNOSIS — M549 Dorsalgia, unspecified: Secondary | ICD-10-CM

## 2012-01-22 DIAGNOSIS — G5790 Unspecified mononeuropathy of unspecified lower limb: Secondary | ICD-10-CM

## 2012-01-22 DIAGNOSIS — I10 Essential (primary) hypertension: Secondary | ICD-10-CM

## 2012-01-22 NOTE — Progress Notes (Signed)
Subjective:    Patient ID: [name], male    DOB: 1950/07/07, 61 y.o.   MRN: [id number]  HPI Patient is a 61 year old male who is followed for diabetes chronic back pain failure.  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  In multilevel degeneration.  He appears to be having increased episodes of fall and fall risk due to the instability of his gait and progressive weakness and inability to exercise   Review of Systems  Constitutional: Positive for activity change and fatigue. Negative for fever.  HENT: Positive for congestion and rhinorrhea. Negative for hearing loss, neck pain and postnasal drip.   Eyes: Negative for discharge, redness and visual disturbance.  Respiratory: Positive for shortness of breath. Negative for cough and wheezing.   Cardiovascular: Negative for leg swelling.  Gastrointestinal: Negative for abdominal pain, constipation and abdominal distention.  Genitourinary: Negative for urgency and frequency.  Musculoskeletal: Positive for myalgias, back pain and joint swelling. Negative for arthralgias.  Skin: Negative for color change and rash.  Neurological: Negative for weakness and light-headedness.  Hematological: Negative for adenopathy.  Psychiatric/Behavioral: Positive for dysphoric mood. Negative for behavioral problems.   Past Medical History  Diagnosis Date  . Coronary atherosclerosis of native coronary artery   . Other malaise and fatigue   . Chest pain, unspecified   . Other dyspnea and respiratory abnormality   . Personal history of unspecified  circulatory disease   . Dysphagia, unspecified   . Internal hemorrhoids without mention of complication   . Hypertrophy of prostate with urinary obstruction and other lower urinary tract symptoms (LUTS)   . Nausea alone   . Gastroparesis   . Backache, unspecified   . Posttraumatic stress disorder   . Hemorrhage of rectum and anus   . Allergy   . Diabetes mellitus 2004  . CHF (congestive heart failure)   . Hyperlipidemia   . Arthritis   . Dizziness   . Headache   . Obesity   . Chills   . Complication of anesthesia     01/2011 could not eat,, hospt x2, was placed on hd and cleared up  . Myocardial infarction 2002  . Stroke     2007  . Unspecified essential hypertension     hx htn   . Chronic kidney disease, stage III (moderate)     HD T- TH-SAT  . Sleep apnea     USES CPAP   . Angina     LAST HAD CP 2 MO AGO  DR WALL [place]    History   Social History  . Marital Status: Married    Spouse Name: N/A    Number of Children: N/A  . Years of Education: N/A   Occupational History  . disabled    Social History Main Topics  . Smoking status: Never Smoker   . Smokeless tobacco: Never Used  . Alcohol Use: No  . Drug Use: No  . Sexually Active: Not Currently   Other Topics Concern  . Not on file   Social History Narrative  . No narrative on file  Past Surgical History  Procedure Date  . Hemorrhoid surgery   . Anal fissurectomy   . Av fistula placement   . Revision of fistula     renal failure  . Cardiac catheterization     2005 DR BRODIE   . Ventral hernia repair 07/01/2011    Procedure: HERNIA REPAIR VENTRAL ADULT;  Surgeon: Clovis Pu. Cornett, MD;  Location: [facility] OR;  Service: General;  Laterality: N/A;    Family History  Problem Relation Age of Onset  . Diabetes Sister   . Heart disease Sister   . Diabetes Maternal Aunt   . Diabetes Maternal Uncle   . Diabetes Paternal Aunt   . Diabetes Paternal Uncle   . Heart disease    . Heart disease Father   .  Heart disease Brother     No Known Allergies  Current Outpatient Prescriptions on File Prior to Visit  Medication Sig Dispense Refill  . Testosterone 1.25 GM/ACT (1%) GEL Place onto the skin.      [name] allopurinol (ZYLOPRIM) 100 MG tablet Take 100 mg by mouth 2 (two) times daily.       [name] aspirin 81 MG tablet Take 81 mg by mouth daily.        . calcium acetate, Phos Binder, (PHOSLYRA) 667 MG/5ML SOLN Take by mouth 3 (three) times daily with meals.      . clopidogrel (PLAVIX) 75 MG tablet Take 75 mg by mouth daily.        . diazepam (VALIUM) 5 MG tablet Take 1 tablet (5 mg total) by mouth daily as needed.  30 tablet  1  . insulin glargine (LANTUS) 100 UNIT/ML injection Inject 90 Units into the skin at bedtime.        . Insulin Pen Needle 31G X 8 MM MISC by Does not apply route. Bd u/f short pen needle 31gx73mm misc(insulin pen needle)--to use 4 times a day with insulin pen       . linagliptin (TRADJENTA) 5 MG TABS tablet Take 5 mg by mouth daily.      . multivitamin (RENA-VIT) TABS tablet Take 1 tablet by mouth daily.      . OMEGA 3 1000 MG CAPS Take 1 capsule (1,000 mg total) by mouth daily.  90 each    . rosuvastatin (CRESTOR) 40 MG tablet Take 40 mg by mouth daily.        . solifenacin (VESICARE) 5 MG tablet Take 5 mg by mouth daily.       . Tamsulosin HCl (FLOMAX) 0.4 MG CAPS Take 0.4 mg by mouth daily.       . traMADol (ULTRAM) 50 MG tablet Take 50 mg by mouth every 6 (six) hours as needed.        . triamcinolone (KENALOG) 0.1 % cream Apply topically 2 (two) times daily.          BP 120/86  Pulse 58  Temp 98.7 F (37.1 C) (Oral)  Wt 218 lb (98.884 kg)       Objective:   Physical Exam  Nursing note and vitals reviewed. Constitutional: He appears well-developed and well-nourished.  HENT:  Head: Normocephalic and atraumatic.  Eyes: Conjunctivae and EOM are normal.  Neck: Normal range of motion. Neck supple.  Cardiovascular: Normal rate.   Murmur heard. Pulmonary/Chest: He has  no wheezes. He has no rales.  Abdominal: He exhibits no distension. There is no tenderness.  Musculoskeletal: He exhibits tenderness.  Neurological: He is alert.  Psychiatric:  depressed          Assessment & Plan:  Depressed chronic medical condition and home stresses.  I do recommend we consider beginning a low-dose antidepressant one that might help with pain such as Cymbalta we'll start with samples of Cymbalta 30 mg one by mouth daily area and this may also help the back pain he has a combined coiled back pain which has to do with degenerative arthritis multilevel disc disease in balance and gait due to residual muscle weakness from stroke and peripheral neuropathy from diabetes.  The combination of these for factors created instability of gait and a high risk for fall as evidenced by the frequency of his falls have increased.  I will prescribe a rolling walker with a seat to aid in fall prevention.  I'm concerned that the Crestor may be contributing to some of the muscle pain even in light of his lipids at this point I think we should hold Crestor for a six-week period of time and see a pain is significantly reduced there is a significant reduction in pain we will attempt to find another modality of control his lipids if there is no change in his pain we will resume the Crestor at the previous dose

## 2012-01-22 NOTE — Patient Instructions (Signed)
Hold Crestor for at least a month to monitor the amount of inflammatory pain you're experiencing it significantly improves continue to hold the Crestor if you notice no difference in the level of musculoskeletal pain resume the Crestor for protection of your lipids.  We'll begin Cymbalta 30 mg by mouth daily for the next 6-8 weeks I will give you samples this should help with both pain tolerance and back pain

## 2012-01-27 ENCOUNTER — Ambulatory Visit
Admission: RE | Admit: 2012-01-27 | Discharge: 2012-01-27 | Disposition: A | Discharge status: Home / Self Care | Payer: Medicare Other | Source: Ambulatory Visit | Attending: Sports Medicine | Admitting: Sports Medicine

## 2012-01-27 ENCOUNTER — Ambulatory Visit (INDEPENDENT_AMBULATORY_CARE_PROVIDER_SITE_OTHER): Payer: 59 | Admitting: Licensed Clinical Social Worker

## 2012-01-27 DIAGNOSIS — M549 Dorsalgia, unspecified: Secondary | ICD-10-CM

## 2012-01-27 DIAGNOSIS — F331 Major depressive disorder, recurrent, moderate: Secondary | ICD-10-CM

## 2012-01-27 DIAGNOSIS — F4310 Post-traumatic stress disorder, unspecified: Secondary | ICD-10-CM

## 2012-01-27 MED ORDER — METHYLPREDNISOLONE ACETATE 40 MG/ML INJ SUSP (RADIOLOG
120.0000 mg | Freq: Once | INTRAMUSCULAR | Status: AC
  Administered 2012-01-27: 120 mg via EPIDURAL

## 2012-01-27 MED ORDER — IOHEXOL 180 MG/ML  SOLN
1.0000 mL | Freq: Once | INTRAMUSCULAR | Status: AC | PRN
  Administered 2012-01-27: 1 mL via EPIDURAL

## 2012-02-10 DIAGNOSIS — N2581 Secondary hyperparathyroidism of renal origin: Secondary | ICD-10-CM | POA: Insufficient documentation

## 2012-02-10 DIAGNOSIS — Z862 Personal history of diseases of the blood and blood-forming organs and certain disorders involving the immune mechanism: Secondary | ICD-10-CM | POA: Insufficient documentation

## 2012-02-17 ENCOUNTER — Ambulatory Visit: Payer: Self-pay | Admitting: Licensed Clinical Social Worker

## 2012-02-19 ENCOUNTER — Ambulatory Visit: Payer: Self-pay | Admitting: Physician Assistant

## 2012-03-03 ENCOUNTER — Encounter (HOSPITAL_COMMUNITY): Payer: Self-pay | Admitting: *Deleted

## 2012-03-03 ENCOUNTER — Emergency Department (HOSPITAL_COMMUNITY)
Admission: EM | Admit: 2012-03-03 | Discharge: 2012-03-04 | Disposition: A | Discharge status: Home / Self Care | Payer: Medicare Other | Attending: Emergency Medicine | Admitting: Emergency Medicine

## 2012-03-03 DIAGNOSIS — Z794 Long term (current) use of insulin: Secondary | ICD-10-CM | POA: Insufficient documentation

## 2012-03-03 DIAGNOSIS — E119 Type 2 diabetes mellitus without complications: Secondary | ICD-10-CM | POA: Insufficient documentation

## 2012-03-03 DIAGNOSIS — R404 Transient alteration of awareness: Secondary | ICD-10-CM | POA: Insufficient documentation

## 2012-03-03 DIAGNOSIS — N183 Chronic kidney disease, stage 3 unspecified: Secondary | ICD-10-CM | POA: Insufficient documentation

## 2012-03-03 DIAGNOSIS — M25559 Pain in unspecified hip: Secondary | ICD-10-CM | POA: Insufficient documentation

## 2012-03-03 DIAGNOSIS — M545 Low back pain, unspecified: Secondary | ICD-10-CM | POA: Insufficient documentation

## 2012-03-03 DIAGNOSIS — R29898 Other symptoms and signs involving the musculoskeletal system: Secondary | ICD-10-CM | POA: Insufficient documentation

## 2012-03-03 DIAGNOSIS — Z8673 Personal history of transient ischemic attack (TIA), and cerebral infarction without residual deficits: Secondary | ICD-10-CM | POA: Insufficient documentation

## 2012-03-03 DIAGNOSIS — E785 Hyperlipidemia, unspecified: Secondary | ICD-10-CM | POA: Insufficient documentation

## 2012-03-03 DIAGNOSIS — G4733 Obstructive sleep apnea (adult) (pediatric): Secondary | ICD-10-CM | POA: Insufficient documentation

## 2012-03-03 DIAGNOSIS — W010XXA Fall on same level from slipping, tripping and stumbling without subsequent striking against object, initial encounter: Secondary | ICD-10-CM | POA: Insufficient documentation

## 2012-03-03 DIAGNOSIS — W19XXXA Unspecified fall, initial encounter: Secondary | ICD-10-CM

## 2012-03-03 DIAGNOSIS — M542 Cervicalgia: Secondary | ICD-10-CM | POA: Insufficient documentation

## 2012-03-03 DIAGNOSIS — Z7982 Long term (current) use of aspirin: Secondary | ICD-10-CM | POA: Insufficient documentation

## 2012-03-03 DIAGNOSIS — M129 Arthropathy, unspecified: Secondary | ICD-10-CM | POA: Insufficient documentation

## 2012-03-03 DIAGNOSIS — S161XXA Strain of muscle, fascia and tendon at neck level, initial encounter: Secondary | ICD-10-CM

## 2012-03-03 DIAGNOSIS — R51 Headache: Secondary | ICD-10-CM | POA: Insufficient documentation

## 2012-03-03 DIAGNOSIS — I509 Heart failure, unspecified: Secondary | ICD-10-CM | POA: Insufficient documentation

## 2012-03-03 DIAGNOSIS — I2510 Atherosclerotic heart disease of native coronary artery without angina pectoris: Secondary | ICD-10-CM | POA: Insufficient documentation

## 2012-03-03 DIAGNOSIS — I129 Hypertensive chronic kidney disease with stage 1 through stage 4 chronic kidney disease, or unspecified chronic kidney disease: Secondary | ICD-10-CM | POA: Insufficient documentation

## 2012-03-03 DIAGNOSIS — Z79899 Other long term (current) drug therapy: Secondary | ICD-10-CM | POA: Insufficient documentation

## 2012-03-03 DIAGNOSIS — S0990XA Unspecified injury of head, initial encounter: Secondary | ICD-10-CM | POA: Insufficient documentation

## 2012-03-03 DIAGNOSIS — Y92009 Unspecified place in unspecified non-institutional (private) residence as the place of occurrence of the external cause: Secondary | ICD-10-CM | POA: Insufficient documentation

## 2012-03-03 DIAGNOSIS — S139XXA Sprain of joints and ligaments of unspecified parts of neck, initial encounter: Secondary | ICD-10-CM | POA: Insufficient documentation

## 2012-03-03 NOTE — ED Notes (Addendum)
Patient transported to CT 

## 2012-03-03 NOTE — ED Provider Notes (Signed)
History     CSN: [id number]  Arrival date & time 03/03/12  2319   First MD Initiated Contact with Patient 03/03/12 2334      Chief Complaint  Patient presents with  . Fall    (Consider location/radiation/quality/duration/timing/severity/associated sxs/prior treatment) HPI Comments: Mr. Kniskern was standing in his kitchen preparing tea when he slipped, falling backwards, striking his head against a washing machine.  He reports that he blacked-out and was laying on the floor for some time before waking up.  He realized he was on the floor but was unable to stand on his own.  He alerted family to his condition by banging on the washer until help arrived.  He reports that he has had progressively worsening weakness in his legs and would have used his wheelchair except his kitchen is a confined area that is inaccessible to the chair forcing him to use the walker.  He denies any blood loss, CP, or SOB.  He is certain that it was an awkward step combined with the weakness in his legs and not a syncopal event.  Patient is a 61 y.o. male presenting with fall. The history is provided by the patient. No language interpreter was used.  Fall The accident occurred 1 to 2 hours ago. The fall occurred while walking and while standing. He landed on a hard floor. There was no blood loss. The point of impact was the head and right shoulder. The pain is present in the head and neck. He was not ambulatory at the scene. There was no entrapment after the fall. Associated symptoms include headaches and loss of consciousness. Pertinent negatives include no visual change, no fever, no numbness, no abdominal pain, no bowel incontinence, no nausea, no vomiting, no hearing loss and no tingling. Treatment on scene includes a c-collar and a backboard.    Past Medical History  Diagnosis Date  . Coronary atherosclerosis of native coronary artery   . Other malaise and fatigue   . Chest pain, unspecified   . Other dyspnea  and respiratory abnormality   . Personal history of unspecified circulatory disease   . Dysphagia, unspecified   . Internal hemorrhoids without mention of complication   . Hypertrophy of prostate with urinary obstruction and other lower urinary tract symptoms (LUTS)   . Nausea alone   . Gastroparesis   . Backache, unspecified   . Posttraumatic stress disorder   . Hemorrhage of rectum and anus   . Allergy   . Diabetes mellitus 2004  . CHF (congestive heart failure)   . Hyperlipidemia   . Arthritis   . Dizziness   . Headache   . Obesity   . Chills   . Complication of anesthesia     01/2011 could not eat,, hospt x2, was placed on hd and cleared up  . Myocardial infarction 2002  . Stroke     2007  . Unspecified essential hypertension     hx htn   . Chronic kidney disease, stage III (moderate)     HD T- TH-SAT  . Sleep apnea     USES CPAP   . Angina     LAST HAD CP 2 MO AGO  DR WALL [place]    Past Surgical History  Procedure Date  . Hemorrhoid surgery   . Anal fissurectomy   . Av fistula placement   . Revision of fistula     renal failure  . Cardiac catheterization     2005 DR BRODIE   .  Ventral hernia repair 07/01/2011    Procedure: HERNIA REPAIR VENTRAL ADULT;  Surgeon: Clovis Pu. Cornett, MD;  Location: [facility] OR;  Service: General;  Laterality: N/A;    Family History  Problem Relation Age of Onset  . Diabetes Sister   . Heart disease Sister   . Diabetes Maternal Aunt   . Diabetes Maternal Uncle   . Diabetes Paternal Aunt   . Diabetes Paternal Uncle   . Heart disease    . Heart disease Father   . Heart disease Brother     History  Substance Use Topics  . Smoking status: Never Smoker   . Smokeless tobacco: Never Used  . Alcohol Use: No      Review of Systems  Constitutional: Negative for fever.  HENT: Positive for neck pain.   Gastrointestinal: Negative for nausea, vomiting, abdominal pain and bowel incontinence.  Musculoskeletal: Positive for back pain,  arthralgias and gait problem.        Chronic, none acute  Neurological: Positive for loss of consciousness and headaches. Negative for tingling and numbness.  All other systems reviewed and are negative.    Allergies  Review of patient's allergies indicates no known allergies.  Home Medications   Current Outpatient Rx  Name Route Sig Dispense Refill  . ALLOPURINOL 100 MG PO TABS Oral Take 100 mg by mouth 2 (two) times daily.     . ASPIRIN 81 MG PO TABS Oral Take 81 mg by mouth daily.      [name] CALCIUM ACETATE (PHOS BINDER) 667 MG/5ML PO SOLN Oral Take by mouth 3 (three) times daily with meals.    . CLOPIDOGREL BISULFATE 75 MG PO TABS Oral Take 75 mg by mouth daily.      [name] DIAZEPAM 5 MG PO TABS Oral Take 1 tablet (5 mg total) by mouth daily as needed. 30 tablet 1  . INSULIN GLARGINE 100 UNIT/ML [place] SOLN Subcutaneous Inject 90 Units into the skin at bedtime.      . INSULIN PEN NEEDLE 31G X 8 MM MISC Does not apply by Does not apply route. Bd u/f short pen needle 31gx77mm misc(insulin pen needle)--to use 4 times a day with insulin pen     . LINAGLIPTIN 5 MG PO TABS Oral Take 5 mg by mouth daily.    [name] RENA-VITE PO TABS Oral Take 1 tablet by mouth daily.    . OMEGA 3 1000 MG PO CAPS Oral Take 1 capsule (1,000 mg total) by mouth daily. 90 each   . ROSUVASTATIN CALCIUM 40 MG PO TABS Oral Take 40 mg by mouth daily.      [name] SOLIFENACIN SUCCINATE 5 MG PO TABS Oral Take 5 mg by mouth daily.     [name] TAMSULOSIN HCL 0.4 MG PO CAPS Oral Take 0.4 mg by mouth daily.     . TESTOSTERONE 1.25 GM/ACT (1%) TD GEL Transdermal Place onto the skin.    [name] TRAMADOL HCL 50 MG PO TABS Oral Take 50 mg by mouth every 6 (six) hours as needed.      . TRIAMCINOLONE ACETONIDE 0.1 % EX CREA Topical Apply topically 2 (two) times daily.        BP 126/72  Pulse 87  Temp 99 F (37.2 C) (Oral)  Resp 16  SpO2 98%  Physical Exam  Nursing note and vitals reviewed. Constitutional: He is oriented to person, place, and time. Vital  signs are normal. He appears well-developed and well-nourished. He is active and cooperative.  Non-toxic appearance.  He does not have a sickly appearance. He does not appear ill. No distress. Cervical collar in place.  HENT:  Head: Normocephalic.  Right Ear: External ear normal.  Left Ear: External ear normal.  Nose: Nose normal.  Mouth/Throat: Oropharynx is clear and moist. No oropharyngeal exudate.       + occipital pain, no significant swelling or palpable deformity  Eyes: Conjunctivae normal and EOM are normal. Pupils are equal, round, and reactive to light. Right eye exhibits no discharge. Left eye exhibits no discharge. No scleral icterus.  Neck: No JVD present. No tracheal deviation present.  Cardiovascular: Normal rate, regular rhythm, normal heart sounds and intact distal pulses.  Exam reveals no gallop and no friction rub.   No murmur heard. Pulmonary/Chest: Effort normal and breath sounds normal. No stridor. No respiratory distress. He has no wheezes. He has no rales. He exhibits no tenderness.  Abdominal: Soft. Bowel sounds are normal. He exhibits no distension and no mass. There is no tenderness. There is no rebound and no guarding.  Musculoskeletal: Normal range of motion. He exhibits tenderness (at hips and lower back, no deformity, intact ROM). He exhibits no edema.  Lymphadenopathy:    He has no cervical adenopathy.  Neurological: He is alert and oriented to person, place, and time. No cranial nerve deficit.  Skin: Skin is warm and dry. No rash noted. No erythema. No pallor.  Psychiatric: He has a normal mood and affect. His behavior is normal.    ED Course  Procedures (including critical care time)  Labs Reviewed - No data to display No results found.   No diagnosis found.    MDM  Pt presents s/p a mechanical fall.  He had a pos LOC.  He is currently A&O, note stable VS, NAD.  He does take asa and plavix and c/o head and neck pain.  Will obtain a CT of head and  c-spine, place on telemetry, and perform serial exams.  He reports that he has developed an unsteady gait over the last several months.  He first used a walking stick, then a walker, and has now been splitting time between a walker and a wheelchair.  He has been seen by a neurologist and is undergoing testing to further evaluate this issue.  0100.  Pt stable, NAD.  CT negative for acute intercranial or C-spine fracture.  Collar removed, no midline tenderness observed.  Pt uses neurontin, gabapentin, tramadol, and valium for pain currently.  Will not prescribe any new medications. Plan discharge home.      Tobin Chad, MD 03/04/12 [phone]

## 2012-03-03 NOTE — ED Notes (Addendum)
Patient was getting up to make some tea and his leg gave out and he fell backwards hitting his head on washer/dryer.  Patient states that had positive LOC and was out for about an hour.  Patient states that when he woke up from the fall he was banging on the door for help.  Patient now c/o headache and neck pain.  Alert and oriented x 3.  Patient is a dialysis patient.

## 2012-03-04 ENCOUNTER — Emergency Department (HOSPITAL_COMMUNITY): Payer: Medicare Other

## 2012-03-04 ENCOUNTER — Encounter (HOSPITAL_COMMUNITY): Payer: Self-pay | Admitting: Radiology

## 2012-03-04 NOTE — ED Notes (Signed)
Patient returned from CT.  Patient put back on monitor.

## 2012-03-07 ENCOUNTER — Other Ambulatory Visit: Payer: Self-pay | Admitting: Internal Medicine

## 2012-03-07 MED ORDER — INSULIN LISPRO PROT & LISPRO (50-50 MIX) 100 UNIT/ML ~~LOC~~ SUSP: 40.0000 [IU] | Freq: Two times a day (BID) | SUBCUTANEOUS | Status: DC

## 2012-03-07 NOTE — Telephone Encounter (Signed)
Talked with pt and he wants to start back on humulin insulin 50/50 40 units bid because bs in 300 in afternoon- wants to stop tradjenta--ok to do this per dr Lovell Sheehan and med sent in

## 2012-03-07 NOTE — Telephone Encounter (Signed)
Pt needs new rx humalog call into [facility] [address]

## 2012-03-23 ENCOUNTER — Encounter: Payer: Self-pay | Admitting: Internal Medicine

## 2012-03-23 ENCOUNTER — Ambulatory Visit (INDEPENDENT_AMBULATORY_CARE_PROVIDER_SITE_OTHER): Payer: Medicare Other | Admitting: Internal Medicine

## 2012-03-23 VITALS — BP 146/82 | HR 76 | Temp 98.3°F | Resp 18 | Ht 69.75 in | Wt 213.8 lb

## 2012-03-23 DIAGNOSIS — Z9181 History of falling: Secondary | ICD-10-CM

## 2012-03-23 DIAGNOSIS — M109 Gout, unspecified: Secondary | ICD-10-CM

## 2012-03-23 DIAGNOSIS — E119 Type 2 diabetes mellitus without complications: Secondary | ICD-10-CM

## 2012-03-23 DIAGNOSIS — I10 Essential (primary) hypertension: Secondary | ICD-10-CM

## 2012-03-23 DIAGNOSIS — G5790 Unspecified mononeuropathy of unspecified lower limb: Secondary | ICD-10-CM

## 2012-03-23 MED ORDER — INSULIN PEN NEEDLE 31G X 5 MM MISC: 1.0000 | Freq: Two times a day (BID) | Status: DC

## 2012-03-23 MED ORDER — INSULIN LISPRO PROT & LISPRO (75-25 MIX) 100 UNIT/ML ~~LOC~~ SUSP: 50.0000 [IU] | Freq: Two times a day (BID) | SUBCUTANEOUS | Status: DC

## 2012-03-23 NOTE — Progress Notes (Signed)
Subjective:    Patient ID: [name], male    DOB: 1950-10-09, 61 y.o.   MRN: [id number]  HPI  This is a 61 year old male with chronic renal failure hypertension diabetes and most recently increased episodes of falling and instability of gait.  He is followed by neurology and has a history of a stroke.  Patient also has complicating factors of peripheral neuropathy and orthostatic hypotension related to dialysis. Has an acute complaint today of gout in his left great toe he is on allopurinol for uric acid reduction   Review of Systems  Constitutional: Positive for activity change and appetite change. Negative for fever and fatigue.  HENT: Negative for hearing loss, congestion, neck pain and postnasal drip.   Eyes: Negative for discharge, redness and visual disturbance.  Respiratory: Positive for shortness of breath. Negative for cough and wheezing.   Cardiovascular: Negative for leg swelling.  Gastrointestinal: Negative for abdominal pain, constipation and abdominal distention.  Genitourinary: Negative for urgency and frequency.  Musculoskeletal: Positive for joint swelling and gait problem. Negative for arthralgias.  Skin: Negative for color change and rash.  Neurological: Positive for dizziness, syncope, weakness and numbness. Negative for light-headedness.  Hematological: Negative for adenopathy.  Psychiatric/Behavioral: Negative for behavioral problems.       Past Medical History  Diagnosis Date  . Coronary atherosclerosis of native coronary artery   . Other malaise and fatigue   . Chest pain, unspecified   . Other dyspnea and respiratory abnormality   . Personal history of unspecified circulatory disease   . Dysphagia, unspecified   . Internal hemorrhoids without mention of complication   . Hypertrophy of prostate with urinary obstruction and other lower urinary tract symptoms (LUTS)   . Nausea alone   . Gastroparesis   . Backache, unspecified   . Posttraumatic  stress disorder   . Hemorrhage of rectum and anus   . Allergy   . Diabetes mellitus 2004  . CHF (congestive heart failure)   . Hyperlipidemia   . Arthritis   . Dizziness   . Headache   . Obesity   . Chills   . Complication of anesthesia     01/2011 could not eat,, hospt x2, was placed on hd and cleared up  . Myocardial infarction 2002  . Stroke     2007  . Unspecified essential hypertension     hx htn   . Chronic kidney disease, stage III (moderate)     HD T- TH-SAT  . Sleep apnea     USES CPAP   . Angina     LAST HAD CP 2 MO AGO  DR WALL [place]    History   Social History  . Marital Status: Married    Spouse Name: N/A    Number of Children: N/A  . Years of Education: N/A   Occupational History  . disabled    Social History Main Topics  . Smoking status: Never Smoker   . Smokeless tobacco: Never Used  . Alcohol Use: No  . Drug Use: No  . Sexually Active: Not Currently   Other Topics Concern  . Not on file   Social History Narrative  . No narrative on file    Past Surgical History  Procedure Date  . Hemorrhoid surgery   . Anal fissurectomy   . Av fistula placement   . Revision of fistula     renal failure  . Cardiac catheterization     2005 DR BRODIE   .  Ventral hernia repair 07/01/2011    Procedure: HERNIA REPAIR VENTRAL ADULT;  Surgeon: Clovis Pu. Cornett, MD;  Location: [facility] OR;  Service: General;  Laterality: N/A;    Family History  Problem Relation Age of Onset  . Diabetes Sister   . Heart disease Sister   . Diabetes Maternal Aunt   . Diabetes Maternal Uncle   . Diabetes Paternal Aunt   . Diabetes Paternal Uncle   . Heart disease    . Heart disease Father   . Heart disease Brother     No Known Allergies  Current Outpatient Prescriptions on File Prior to Visit  Medication Sig Dispense Refill  . allopurinol (ZYLOPRIM) 100 MG tablet Take 100 mg by mouth 2 (two) times daily.       [name] aspirin 81 MG tablet Take 81 mg by mouth daily.        .  calcium acetate (PHOSLO) 667 MG capsule Take 667 mg by mouth 3 (three) times daily with meals.      . clopidogrel (PLAVIX) 75 MG tablet Take 75 mg by mouth daily.        . diazepam (VALIUM) 5 MG tablet Take 5 mg by mouth every 6 (six) hours as needed. For anxiety      . gabapentin (NEURONTIN) 300 MG capsule Take 300 mg by mouth 2 (two) times daily.      . insulin glargine (LANTUS) 100 UNIT/ML injection Inject 90 Units into the skin at bedtime.        [name] linagliptin (TRADJENTA) 5 MG TABS tablet Take 5 mg by mouth daily.      . metoprolol tartrate (LOPRESSOR) 25 MG tablet Take 12.5 mg by mouth daily.      . multivitamin (RENA-VIT) TABS tablet Take 1 tablet by mouth daily.      [name] omeprazole (PRILOSEC) 20 MG capsule Take 20 mg by mouth daily.      . solifenacin (VESICARE) 5 MG tablet Take 5 mg by mouth daily.       . Tamsulosin HCl (FLOMAX) 0.4 MG CAPS Take 0.4 mg by mouth daily.       [name] testosterone (ANDROGEL) 50 MG/5GM GEL Place 5 g onto the skin daily.      . traMADol (ULTRAM) 50 MG tablet Take 50 mg by mouth every 6 (six) hours as needed. For pain        BP 146/82  Pulse 76  Temp 98.3 F (36.8 C)  Resp 18  Ht 5' 9.75" (1.772 m)  Wt 213 lb 13.5 oz (97 kg)  BMI 30.90 kg/m2     Objective:   Physical Exam  Constitutional: He appears well-developed and well-nourished.  HENT:  Head: Normocephalic and atraumatic.  Eyes: Conjunctivae normal are normal. Pupils are equal, round, and reactive to light.  Neck: Normal range of motion. Neck supple.  Cardiovascular: Normal rate and regular rhythm.   Murmur heard. Pulmonary/Chest: Breath sounds normal.  Abdominal: Soft. Bowel sounds are normal.  Musculoskeletal: He exhibits edema and tenderness.  Skin: Skin is warm and dry.          Assessment & Plan:  Changing his insulin from [phone] for a longer action since he used it twice daily and hopefully better control.  We will continue Lantus at the current dose we'll continue the  drainage and the because it does not have to be adjusted for renal failure.  I believe his falls are multifactorial I believe neuropathy plays a role height with tension related  to his dialysis as well as vascular insufficiency related to his stroke.

## 2012-03-23 NOTE — Patient Instructions (Addendum)
Going to stop the 50-50 insulin mixture and go to 7525 insulin mixture to give you better control centrally inject twice a day

## 2012-04-15 ENCOUNTER — Other Ambulatory Visit: Payer: Self-pay | Admitting: Neurology

## 2012-04-15 DIAGNOSIS — I6990 Unspecified sequelae of unspecified cerebrovascular disease: Secondary | ICD-10-CM

## 2012-04-17 ENCOUNTER — Other Ambulatory Visit: Payer: Self-pay | Admitting: Internal Medicine

## 2012-04-26 ENCOUNTER — Other Ambulatory Visit: Payer: Self-pay | Admitting: Internal Medicine

## 2012-04-27 ENCOUNTER — Telehealth: Payer: Self-pay | Admitting: Internal Medicine

## 2012-04-27 MED ORDER — FLUCONAZOLE 100 MG PO TABS: 100.0000 mg | ORAL_TABLET | Freq: Every day | ORAL | Status: DC

## 2012-04-27 NOTE — Telephone Encounter (Signed)
PER DR Ruby Cola diflucan 100 qd for 7 days-pt informed and med sent in

## 2012-04-27 NOTE — Telephone Encounter (Signed)
Patient Information:  Caller Name: [name]  Phone: [phone]  Patient: [name], [name]  Gender: Male  DOB: 06-28-1950  Age: 61 Years  PCP: [name] (Adults only)   Symptoms  Reason For Call & Symptoms: [name] to dentist on 04/25/12 with dentist telling patient he had a yeast infection.  Had symptoms but didn't think anything of them.  Burning on tongue and roof of mouth, sensitive with coating on roof of mouth--mostly now on tongue and throat.  Reviewed Health History In EMR: Yes  Reviewed Medications In EMR: Yes  Reviewed Allergies In EMR: Yes  Reviewed Surgeries / Procedures: Yes  Date of Onset of Symptoms: 04/14/2012  Treatments Tried: Mouthwash from the past; but has not helped tongue or throat.  Mouth wash was some given by dentist some time ago.  Treatments Tried Worked: No  Guideline(s) Used:  No Protocol Available - Sick Adult  Disposition Per Guideline:   Discuss with PCP and Callback by Nurse Today  Reason For Disposition Reached:   Nursing judgment  Advice Given:  N/A  Office Follow Up:  Does the office need to follow up with this patient?: Yes  Instructions For The Office: Was diagnosed by Dentist on 04/25/12 with oral yeast on tongue and back of throat.  Dentist unable to prescribe treatment was told that PCP would call something in for patient.  RN Note:  Patient is able to eat and drink, but it burns and is uncomfortable.  Sees coating on back of tongue, but dentist says the bad breath and throat sensations is coming from an oral yeast infection.  Was given antibiotic by urologist recently for prostate infection. Triaged using [facility] in [facility]. With a disposition to call provider within 24 hours due to no standing orders.  Patient is requesting something be called in to [facility] on [address] [phone]) to treat the symptoms.  Last visit was 03/23/12  Patient is on dialysis and gets monthly blood work.

## 2012-04-29 ENCOUNTER — Ambulatory Visit
Admission: RE | Admit: 2012-04-29 | Discharge: 2012-04-29 | Disposition: A | Discharge status: Home / Self Care | Payer: Medicare Other | Source: Ambulatory Visit | Attending: Neurology | Admitting: Neurology

## 2012-04-29 DIAGNOSIS — I6990 Unspecified sequelae of unspecified cerebrovascular disease: Secondary | ICD-10-CM

## 2012-05-13 ENCOUNTER — Encounter (HOSPITAL_COMMUNITY): Payer: Self-pay | Admitting: *Deleted

## 2012-05-13 ENCOUNTER — Emergency Department (HOSPITAL_COMMUNITY)
Admission: EM | Admit: 2012-05-13 | Discharge: 2012-05-13 | Disposition: A | Discharge status: Home / Self Care | Payer: Medicare Other | Attending: Emergency Medicine | Admitting: Emergency Medicine

## 2012-05-13 ENCOUNTER — Emergency Department (HOSPITAL_COMMUNITY): Payer: Medicare Other

## 2012-05-13 DIAGNOSIS — I509 Heart failure, unspecified: Secondary | ICD-10-CM | POA: Insufficient documentation

## 2012-05-13 DIAGNOSIS — Z8709 Personal history of other diseases of the respiratory system: Secondary | ICD-10-CM | POA: Insufficient documentation

## 2012-05-13 DIAGNOSIS — R110 Nausea: Secondary | ICD-10-CM | POA: Insufficient documentation

## 2012-05-13 DIAGNOSIS — Z9981 Dependence on supplemental oxygen: Secondary | ICD-10-CM | POA: Insufficient documentation

## 2012-05-13 DIAGNOSIS — Z8719 Personal history of other diseases of the digestive system: Secondary | ICD-10-CM | POA: Insufficient documentation

## 2012-05-13 DIAGNOSIS — Z87448 Personal history of other diseases of urinary system: Secondary | ICD-10-CM | POA: Insufficient documentation

## 2012-05-13 DIAGNOSIS — Z8739 Personal history of other diseases of the musculoskeletal system and connective tissue: Secondary | ICD-10-CM | POA: Insufficient documentation

## 2012-05-13 DIAGNOSIS — Z8673 Personal history of transient ischemic attack (TIA), and cerebral infarction without residual deficits: Secondary | ICD-10-CM | POA: Insufficient documentation

## 2012-05-13 DIAGNOSIS — Z8679 Personal history of other diseases of the circulatory system: Secondary | ICD-10-CM | POA: Insufficient documentation

## 2012-05-13 DIAGNOSIS — E119 Type 2 diabetes mellitus without complications: Secondary | ICD-10-CM | POA: Insufficient documentation

## 2012-05-13 DIAGNOSIS — I2510 Atherosclerotic heart disease of native coronary artery without angina pectoris: Secondary | ICD-10-CM | POA: Insufficient documentation

## 2012-05-13 DIAGNOSIS — Z7982 Long term (current) use of aspirin: Secondary | ICD-10-CM | POA: Insufficient documentation

## 2012-05-13 DIAGNOSIS — W1809XA Striking against other object with subsequent fall, initial encounter: Secondary | ICD-10-CM | POA: Insufficient documentation

## 2012-05-13 DIAGNOSIS — Z7902 Long term (current) use of antithrombotics/antiplatelets: Secondary | ICD-10-CM | POA: Insufficient documentation

## 2012-05-13 DIAGNOSIS — I120 Hypertensive chronic kidney disease with stage 5 chronic kidney disease or end stage renal disease: Secondary | ICD-10-CM | POA: Insufficient documentation

## 2012-05-13 DIAGNOSIS — I252 Old myocardial infarction: Secondary | ICD-10-CM | POA: Insufficient documentation

## 2012-05-13 DIAGNOSIS — E785 Hyperlipidemia, unspecified: Secondary | ICD-10-CM | POA: Insufficient documentation

## 2012-05-13 DIAGNOSIS — Z794 Long term (current) use of insulin: Secondary | ICD-10-CM | POA: Insufficient documentation

## 2012-05-13 DIAGNOSIS — Z79899 Other long term (current) drug therapy: Secondary | ICD-10-CM | POA: Insufficient documentation

## 2012-05-13 DIAGNOSIS — R55 Syncope and collapse: Secondary | ICD-10-CM | POA: Insufficient documentation

## 2012-05-13 DIAGNOSIS — N186 End stage renal disease: Secondary | ICD-10-CM | POA: Insufficient documentation

## 2012-05-13 DIAGNOSIS — R209 Unspecified disturbances of skin sensation: Secondary | ICD-10-CM | POA: Insufficient documentation

## 2012-05-13 DIAGNOSIS — S060X9A Concussion with loss of consciousness of unspecified duration, initial encounter: Secondary | ICD-10-CM | POA: Insufficient documentation

## 2012-05-13 DIAGNOSIS — G4730 Sleep apnea, unspecified: Secondary | ICD-10-CM | POA: Insufficient documentation

## 2012-05-13 DIAGNOSIS — IMO0002 Reserved for concepts with insufficient information to code with codable children: Secondary | ICD-10-CM | POA: Insufficient documentation

## 2012-05-13 DIAGNOSIS — Z992 Dependence on renal dialysis: Secondary | ICD-10-CM | POA: Insufficient documentation

## 2012-05-13 DIAGNOSIS — S0990XA Unspecified injury of head, initial encounter: Secondary | ICD-10-CM

## 2012-05-13 DIAGNOSIS — Y9389 Activity, other specified: Secondary | ICD-10-CM | POA: Insufficient documentation

## 2012-05-13 DIAGNOSIS — Z9861 Coronary angioplasty status: Secondary | ICD-10-CM | POA: Insufficient documentation

## 2012-05-13 DIAGNOSIS — E669 Obesity, unspecified: Secondary | ICD-10-CM | POA: Insufficient documentation

## 2012-05-13 DIAGNOSIS — Y9289 Other specified places as the place of occurrence of the external cause: Secondary | ICD-10-CM | POA: Insufficient documentation

## 2012-05-13 DIAGNOSIS — Z8659 Personal history of other mental and behavioral disorders: Secondary | ICD-10-CM | POA: Insufficient documentation

## 2012-05-13 LAB — CBC WITH DIFFERENTIAL/PLATELET
Basophils Absolute: 0 10*3/uL (ref 0.0–0.1)
Basophils Relative: 0 % (ref 0–1)
Eosinophils Absolute: 0.1 10*3/uL (ref 0.0–0.7)
HCT: 41.2 % (ref 39.0–52.0)
Hemoglobin: 13.9 g/dL (ref 13.0–17.0)
MCH: 29.2 pg (ref 26.0–34.0)
MCHC: 33.7 g/dL (ref 30.0–36.0)
Monocytes Absolute: 0.4 10*3/uL (ref 0.1–1.0)
Monocytes Relative: 8 % (ref 3–12)
Neutrophils Relative %: 57 % (ref 43–77)
RDW: 14.1 % (ref 11.5–15.5)

## 2012-05-13 LAB — BASIC METABOLIC PANEL
BUN: 38 mg/dL — ABNORMAL HIGH (ref 6–23)
Creatinine, Ser: 7.49 mg/dL — ABNORMAL HIGH (ref 0.50–1.35)
GFR calc Af Amer: 8 mL/min — ABNORMAL LOW (ref >90)
GFR calc non Af Amer: 7 mL/min — ABNORMAL LOW (ref >90)

## 2012-05-13 MED ORDER — TRAMADOL HCL 50 MG PO TABS: 50.0000 mg | ORAL_TABLET | Freq: Three times a day (TID) | ORAL | Status: DC | PRN

## 2012-05-13 MED ORDER — OXYCODONE-ACETAMINOPHEN 5-325 MG PO TABS
1.0000 | ORAL_TABLET | Freq: Once | ORAL | Status: AC
  Administered 2012-05-13: 1 via ORAL
  Filled 2012-05-13: qty 1

## 2012-05-13 NOTE — ED Notes (Signed)
Patient transported to X-ray 

## 2012-05-13 NOTE — ED Notes (Signed)
Pt working in backyard this evening, tripped over [employer] wall. Larey Seat backwards and landed on back and hit back of head. Reports LOC for approximately 1 hr. 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 Pt is a dialysis pt with fistula in left arm. Pt is alert and oriented x 4 and neuro intact at baseline. 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

## 2012-05-13 NOTE — ED Provider Notes (Signed)
History     CSN: [id number]  Arrival date & time 05/13/12  1949   First MD Initiated Contact with Patient 05/13/12 1955      Chief Complaint  Patient presents with  . Fall    (Consider location/radiation/quality/duration/timing/severity/associated sxs/prior treatment) HPI The patient presents after a fall with subsequent loss of consciousness.  He states that he was in his usual state of health prior to the fall.  He has a history of significant neuropathy, uses a walker at baseline.  Arrival the patient was not using his walker, tripped, fell.  He struck his head against a brick.  He was unconscious for some time, awoke, and noticed pain in the occiput.  He also noticed a more severe pain in his low back. The patient has baseline poor sensation and strength in his lower extremities.  He states that this is unchanged.  He denies ongoing nausea, vomiting, dyspnea, chest pain, abdominal pain. The patient is a dialysis patient.  He notes no recent concerning events regarding this.  No medication taken for relief thus far.  Symptoms are worse with motion, or palpate  Past Medical History  Diagnosis Date  . Coronary atherosclerosis of native coronary artery   . Other malaise and fatigue   . Chest pain, unspecified   . Other dyspnea and respiratory abnormality   . Personal history of unspecified circulatory disease   . Dysphagia, unspecified   . Internal hemorrhoids without mention of complication   . Hypertrophy of prostate with urinary obstruction and other lower urinary tract symptoms (LUTS)   . Nausea alone   . Gastroparesis   . Backache, unspecified   . Posttraumatic stress disorder   . Hemorrhage of rectum and anus   . Allergy   . Diabetes mellitus 2004  . CHF (congestive heart failure)   . Hyperlipidemia   . Arthritis   . Dizziness   . Headache   . Obesity   . Chills   . Complication of anesthesia     01/2011 could not eat,, hospt x2, was placed on hd and cleared up    . Myocardial infarction 2002  . Stroke     2007  . Unspecified essential hypertension     hx htn   . Chronic kidney disease, stage III (moderate)     HD T- TH-SAT  . Sleep apnea     USES CPAP   . Angina     LAST HAD CP 2 MO AGO  DR WALL [place]    Past Surgical History  Procedure Date  . Hemorrhoid surgery   . Anal fissurectomy   . Av fistula placement   . Revision of fistula     renal failure  . Cardiac catheterization     2005 DR BRODIE   . Ventral hernia repair 07/01/2011    Procedure: HERNIA REPAIR VENTRAL ADULT;  Surgeon: Clovis Pu. Cornett, MD;  Location: [facility] OR;  Service: General;  Laterality: N/A;    Family History  Problem Relation Age of Onset  . Diabetes Sister   . Heart disease Sister   . Diabetes Maternal Aunt   . Diabetes Maternal Uncle   . Diabetes Paternal Aunt   . Diabetes Paternal Uncle   . Heart disease    . Heart disease Father   . Heart disease Brother     History  Substance Use Topics  . Smoking status: Never Smoker   . Smokeless tobacco: Never Used  . Alcohol Use: No  Review of Systems  Constitutional:       Per HPI, otherwise negative  HENT:       Per HPI, otherwise negative  Eyes: Negative.   Respiratory:       Per HPI, otherwise negative  Cardiovascular:       Per HPI, otherwise negative  Gastrointestinal: Positive for nausea. Negative for vomiting.  Genitourinary: Negative.   Musculoskeletal:       Per HPI, otherwise negative  Skin: Negative.   Neurological: Positive for syncope, numbness and headaches. Negative for light-headedness.    Allergies  Review of patient's allergies indicates no known allergies.  Home Medications   Current Outpatient Rx  Name  Route  Sig  Dispense  Refill  . ALLOPURINOL 100 MG PO TABS   Oral   Take 100 mg by mouth 2 (two) times daily.          . ASPIRIN 81 MG PO TABS   Oral   Take 81 mg by mouth daily.           [name] CALCIUM ACETATE 667 MG PO CAPS   Oral   Take 667 mg by mouth  3 (three) times daily with meals.         . CLOPIDOGREL BISULFATE 75 MG PO TABS   Oral   Take 75 mg by mouth at bedtime.          [name] DIAZEPAM 5 MG PO TABS               . GABAPENTIN 300 MG PO CAPS   Oral   Take 300 mg by mouth 2 (two) times daily.         . INSULIN GLARGINE 100 UNIT/ML [place] SOLN   Subcutaneous   Inject 90 Units into the skin at bedtime.           . INSULIN LISPRO PROT & LISPRO (75-25) 100 UNIT/ML [place] SUSP   Subcutaneous   Inject 50 Units into the skin 2 (two) times daily with a meal.   10 mL   12   . LINAGLIPTIN 5 MG PO TABS   Oral   Take 5 mg by mouth daily.         [name] METOPROLOL TARTRATE 25 MG PO TABS   Oral   Take 12.5 mg by mouth daily.         [name] RENA-VITE PO TABS   Oral   Take 1 tablet by mouth daily.         [name] OMEPRAZOLE 20 MG PO CPDR   Oral   Take 20 mg by mouth daily.         [name] SOLIFENACIN SUCCINATE 5 MG PO TABS   Oral   Take 5 mg by mouth daily.          [name] TAMSULOSIN HCL 0.4 MG PO CAPS   Oral   Take 0.4 mg by mouth daily.          . TRAMADOL HCL 50 MG PO TABS   Oral   Take 50 mg by mouth 2 (two) times daily. For pain. Take everyday per patient           BP 144/88  Pulse 80  Temp 98.1 F (36.7 C) (Oral)  Resp 10  SpO2 98%  Physical Exam  Nursing note and vitals reviewed. Constitutional: He is oriented to person, place, and time. He appears well-developed. No distress.  HENT:  Head: Normocephalic and atraumatic.    Eyes:  Conjunctivae normal and EOM are normal.  Neck: Neck supple. No spinous process tenderness and no muscular tenderness present. No erythema and normal range of motion present.  Cardiovascular: Normal rate and regular rhythm.   Pulmonary/Chest: Effort normal. No stridor. No respiratory distress.  Abdominal: He exhibits no distension.  Musculoskeletal: He exhibits no edema.       Arms: Neurological: He is alert and oriented to person, place, and time. He displays atrophy. He displays no  tremor. No cranial nerve deficit or sensory deficit. He exhibits abnormal muscle tone. He displays no seizure activity. Coordination normal.       The patient states that he has no change in his weakness and sensation loss in his lower tremors.  He has 3/5 strength bilaterally, symmetrically.  Skin: Skin is warm and dry.  Psychiatric: He has a normal mood and affect.    ED Course  Procedures (including critical care time)   Labs Reviewed  CBC WITH DIFFERENTIAL  BASIC METABOLIC PANEL   No results found.   No diagnosis found.  Cardiac: 80 sinus rhythm normal Pulse ox 99% room air normal    Date: 05/13/2012  Rate: 81  Rhythm: normal sinus rhythm  QRS Axis: left  Intervals: PR prolonged  ST/T Wave abnormalities: nonspecific T wave changes  Conduction Disutrbances:first-degree A-V block   Narrative Interpretation:   Old EKG Reviewed: changes noted T-wave inversions laterally   MDM  This elderly appearing male, with chronic kidney disease presents after a mechanical fall with subsequent loss of consciousness, that lasted for several minutes.  On exam the patient is awake alert, though he has a notable hematoma to his posterior scalp.  Given this, the patient had CT imaging, as well as lab studies.  The patient's studies were consistent with his prior results, including a demonstration of ongoing renal dysfunction.  The patient is neurologically intact distally, and with no notable CT head findings, and appropriate for discharge with close outpatient followup.   Gerhard Munch, MD 05/13/12 [phone]

## 2012-05-13 NOTE — ED Notes (Signed)
Pt getting dressed.

## 2012-06-13 ENCOUNTER — Other Ambulatory Visit (HOSPITAL_COMMUNITY): Payer: Self-pay | Admitting: Nephrology

## 2012-06-13 DIAGNOSIS — N186 End stage renal disease: Secondary | ICD-10-CM

## 2012-06-15 ENCOUNTER — Ambulatory Visit (HOSPITAL_COMMUNITY)
Admission: RE | Admit: 2012-06-15 | Discharge: 2012-06-15 | Disposition: A | Discharge status: Home / Self Care | Payer: Medicare Other | Source: Ambulatory Visit | Attending: Nephrology | Admitting: Nephrology

## 2012-06-15 DIAGNOSIS — Y832 Surgical operation with anastomosis, bypass or graft as the cause of abnormal reaction of the patient, or of later complication, without mention of misadventure at the time of the procedure: Secondary | ICD-10-CM | POA: Insufficient documentation

## 2012-06-15 DIAGNOSIS — T82898A Other specified complication of vascular prosthetic devices, implants and grafts, initial encounter: Secondary | ICD-10-CM | POA: Insufficient documentation

## 2012-06-15 DIAGNOSIS — N186 End stage renal disease: Secondary | ICD-10-CM

## 2012-06-15 DIAGNOSIS — Z992 Dependence on renal dialysis: Secondary | ICD-10-CM | POA: Insufficient documentation

## 2012-06-15 MED ORDER — IOHEXOL 300 MG/ML  SOLN
100.0000 mL | Freq: Once | INTRAMUSCULAR | Status: AC | PRN
  Administered 2012-06-15: 40 mL via INTRAVENOUS

## 2012-06-22 ENCOUNTER — Ambulatory Visit: Payer: Medicare Other | Admitting: Internal Medicine

## 2012-06-22 ENCOUNTER — Ambulatory Visit: Payer: Self-pay | Admitting: Internal Medicine

## 2012-07-13 ENCOUNTER — Telehealth: Payer: Self-pay | Admitting: Internal Medicine

## 2012-07-13 NOTE — Telephone Encounter (Signed)
Patient called stating that he need samples of tradjenta 5mg  1poqd. Please assist.

## 2012-07-14 NOTE — Telephone Encounter (Signed)
Left message on machine Samples up front

## 2012-08-03 ENCOUNTER — Telehealth: Payer: Self-pay | Admitting: Internal Medicine

## 2012-08-03 NOTE — Telephone Encounter (Signed)
Patient states he needs a "prescription for a handicap placard". He states he was mailed a form for it, but lost it. States that last time, Dr. Lovell Sheehan wrote him a rx for a handicap permit? Please advise and call to.

## 2012-08-03 NOTE — Telephone Encounter (Signed)
Pt will pick up form and bring to be completed

## 2012-08-10 ENCOUNTER — Encounter: Payer: Self-pay | Admitting: Internal Medicine

## 2012-08-10 ENCOUNTER — Ambulatory Visit: Payer: Medicare Other | Admitting: Internal Medicine

## 2012-08-10 ENCOUNTER — Ambulatory Visit (INDEPENDENT_AMBULATORY_CARE_PROVIDER_SITE_OTHER): Payer: Medicare Other | Admitting: Internal Medicine

## 2012-08-10 VITALS — BP 140/80 | HR 70 | Temp 98.4°F | Wt 222.0 lb

## 2012-08-10 DIAGNOSIS — E119 Type 2 diabetes mellitus without complications: Secondary | ICD-10-CM

## 2012-08-10 DIAGNOSIS — R5381 Other malaise: Secondary | ICD-10-CM

## 2012-08-10 DIAGNOSIS — E291 Testicular hypofunction: Secondary | ICD-10-CM

## 2012-08-10 DIAGNOSIS — I10 Essential (primary) hypertension: Secondary | ICD-10-CM

## 2012-08-10 NOTE — Progress Notes (Signed)
Subjective:    Patient ID: [name], male    DOB: 06/15/1950, 62 y.o.   MRN: [id number]  HPI  The patient has DM and ESRD on dialysis Had a hypotensive episode after dialysis [facility]  Review of Systems  Constitutional: Negative for fever and fatigue.  HENT: Negative for hearing loss, congestion, neck pain and postnasal drip.   Eyes: Negative for discharge, redness and visual disturbance.  Respiratory: Negative for cough, shortness of breath and wheezing.   Cardiovascular: Negative for leg swelling.  Gastrointestinal: Negative for abdominal pain, constipation and abdominal distention.  Genitourinary: Negative for urgency and frequency.  Musculoskeletal: Negative for joint swelling and arthralgias.  Skin: Negative for color change and rash.  Neurological: Negative for weakness and light-headedness.  Hematological: Negative for adenopathy.  Psychiatric/Behavioral: Negative for behavioral problems.   Past Medical History  Diagnosis Date  . Coronary atherosclerosis of native coronary artery   . Other malaise and fatigue   . Chest pain, unspecified   . Other dyspnea and respiratory abnormality   . Personal history of unspecified circulatory disease   . Dysphagia, unspecified   . Internal hemorrhoids without mention of complication   . Hypertrophy of prostate with urinary obstruction and other lower urinary tract symptoms (LUTS)   . Nausea alone   . Gastroparesis   . Backache, unspecified   . Posttraumatic stress disorder   . Hemorrhage of rectum and anus   . Allergy   . Diabetes mellitus 2004  . CHF (congestive heart failure)   . Hyperlipidemia   . Arthritis   . Dizziness   . Headache   . Obesity   . Chills   . Complication of anesthesia     01/2011 could not eat,, hospt x2, was placed on hd and cleared up  . Myocardial infarction 2002  . Stroke     2007  . Unspecified essential hypertension     hx htn   . Chronic kidney  disease, stage III (moderate)     HD T- TH-SAT  . Sleep apnea     USES CPAP   . Angina     LAST HAD CP 2 MO AGO  DR WALL [place]    History   Social History  . Marital Status: Married    Spouse Name: N/A    Number of Children: N/A  . Years of Education: N/A   Occupational History  . disabled    Social History Main Topics  . Smoking status: Never Smoker   . Smokeless tobacco: Never Used  . Alcohol Use: No  . Drug Use: No  . Sexually Active: Not Currently   Other Topics Concern  . Not on file   Social History Narrative  . No narrative on file    Past Surgical History  Procedure Laterality Date  . Hemorrhoid surgery    . Anal fissurectomy    . Av fistula placement    . Revision of fistula      renal failure  . Cardiac catheterization      2005 DR BRODIE   . Ventral hernia repair  07/01/2011    Procedure: HERNIA REPAIR VENTRAL ADULT;  Surgeon: Clovis Pu. Cornett, MD;  Location: [facility] OR;  Service: General;  Laterality: N/A;    Family History  Problem Relation Age of Onset  . Diabetes Sister   . Heart disease Sister   . Diabetes Maternal Aunt   . Diabetes Maternal Uncle   .  Diabetes Paternal Aunt   . Diabetes Paternal Uncle   . Heart disease    . Heart disease Father   . Heart disease Brother     No Known Allergies  Current Outpatient Prescriptions on File Prior to Visit  Medication Sig Dispense Refill  . allopurinol (ZYLOPRIM) 100 MG tablet Take 100 mg by mouth 2 (two) times daily.       [name] aspirin 81 MG tablet Take 81 mg by mouth daily.        . calcium acetate (PHOSLO) 667 MG capsule Take 667 mg by mouth 3 (three) times daily with meals.      . clopidogrel (PLAVIX) 75 MG tablet Take 75 mg by mouth at bedtime.       . diazepam (VALIUM) 5 MG tablet       . gabapentin (NEURONTIN) 300 MG capsule Take 300 mg by mouth 2 (two) times daily.      . insulin glargine (LANTUS) 100 UNIT/ML injection Inject 90 Units into the skin at bedtime.        . insulin lispro  protamine-insulin lispro (HUMALOG MIX 75/25 KWIKPEN) (75-25) 100 UNIT/ML SUSP Inject 50 Units into the skin 2 (two) times daily with a meal.  10 mL  12  . linagliptin (TRADJENTA) 5 MG TABS tablet Take 5 mg by mouth daily.      . metoprolol tartrate (LOPRESSOR) 25 MG tablet Take 12.5 mg by mouth daily.      . multivitamin (RENA-VIT) TABS tablet Take 1 tablet by mouth daily.      [name] omeprazole (PRILOSEC) 20 MG capsule Take 20 mg by mouth daily.      . solifenacin (VESICARE) 5 MG tablet Take 5 mg by mouth daily.       . Tamsulosin HCl (FLOMAX) 0.4 MG CAPS Take 0.4 mg by mouth daily.       . traMADol (ULTRAM) 50 MG tablet Take 1 tablet (50 mg total) by mouth every 8 (eight) hours as needed for pain. For pain. Take everyday per patient  15 tablet  0   No current facility-administered medications on file prior to visit.    BP 140/80  Pulse 70  Temp(Src) 98.4 F (36.9 C) (Oral)  Wt 222 lb (100.699 kg)  BMI 32.07 kg/m2        Objective:   Physical Exam  Nursing note and vitals reviewed. Constitutional: He appears well-developed and well-nourished.  HENT:  Head: Normocephalic and atraumatic.  Eyes: Conjunctivae are normal. Pupils are equal, round, and reactive to light.  Neck: Normal range of motion. Neck supple.  Cardiovascular: Normal rate and regular rhythm.   Murmur heard. Pulmonary/Chest: Effort normal and breath sounds normal.  Abdominal: Soft. Bowel sounds are normal.          Assessment & Plan:  The patient has HTN now partly controlled with dialysis and medications The patient has lost weight and has been stable with no reported excessive chest pain The aggressive dialysis yesterday resulted in the weakness from rapid volume shifts Lads to be faxed and we will revied Obesity and HTN contributed to DM and renal failure

## 2012-08-29 ENCOUNTER — Ambulatory Visit: Payer: Medicare Other | Admitting: Internal Medicine

## 2012-08-29 ENCOUNTER — Telehealth: Payer: Self-pay | Admitting: Internal Medicine

## 2012-08-29 MED ORDER — LINAGLIPTIN 5 MG PO TABS: 5.0000 mg | ORAL_TABLET | Freq: Every day | ORAL | Status: DC

## 2012-08-29 NOTE — Telephone Encounter (Signed)
Pt would like more samples of tradjenta 5 mg if none avail [facility] [address]

## 2012-08-29 NOTE — Telephone Encounter (Signed)
None avialable- sent to pharmacy and pt informed

## 2012-09-03 ENCOUNTER — Other Ambulatory Visit: Payer: Self-pay | Admitting: Neurology

## 2012-09-28 ENCOUNTER — Other Ambulatory Visit (HOSPITAL_COMMUNITY): Payer: Self-pay | Admitting: Nephrology

## 2012-09-28 DIAGNOSIS — N186 End stage renal disease: Secondary | ICD-10-CM

## 2012-09-30 ENCOUNTER — Encounter (HOSPITAL_COMMUNITY): Payer: Self-pay

## 2012-09-30 ENCOUNTER — Ambulatory Visit (HOSPITAL_COMMUNITY)
Admission: RE | Admit: 2012-09-30 | Discharge: 2012-09-30 | Disposition: A | Discharge status: Home / Self Care | Payer: Medicare Other | Source: Ambulatory Visit | Attending: Nephrology | Admitting: Nephrology

## 2012-09-30 ENCOUNTER — Other Ambulatory Visit (HOSPITAL_COMMUNITY): Payer: Self-pay | Admitting: Nephrology

## 2012-09-30 ENCOUNTER — Ambulatory Visit (HOSPITAL_COMMUNITY): Admission: RE | Admit: 2012-09-30 | Payer: Medicare Other | Source: Ambulatory Visit

## 2012-09-30 DIAGNOSIS — I509 Heart failure, unspecified: Secondary | ICD-10-CM | POA: Insufficient documentation

## 2012-09-30 DIAGNOSIS — M129 Arthropathy, unspecified: Secondary | ICD-10-CM | POA: Insufficient documentation

## 2012-09-30 DIAGNOSIS — Z794 Long term (current) use of insulin: Secondary | ICD-10-CM | POA: Insufficient documentation

## 2012-09-30 DIAGNOSIS — Z7982 Long term (current) use of aspirin: Secondary | ICD-10-CM | POA: Insufficient documentation

## 2012-09-30 DIAGNOSIS — N138 Other obstructive and reflux uropathy: Secondary | ICD-10-CM | POA: Insufficient documentation

## 2012-09-30 DIAGNOSIS — Z8249 Family history of ischemic heart disease and other diseases of the circulatory system: Secondary | ICD-10-CM | POA: Insufficient documentation

## 2012-09-30 DIAGNOSIS — G4730 Sleep apnea, unspecified: Secondary | ICD-10-CM | POA: Insufficient documentation

## 2012-09-30 DIAGNOSIS — N186 End stage renal disease: Secondary | ICD-10-CM

## 2012-09-30 DIAGNOSIS — E119 Type 2 diabetes mellitus without complications: Secondary | ICD-10-CM | POA: Insufficient documentation

## 2012-09-30 DIAGNOSIS — F4310 Post-traumatic stress disorder, unspecified: Secondary | ICD-10-CM | POA: Insufficient documentation

## 2012-09-30 DIAGNOSIS — E669 Obesity, unspecified: Secondary | ICD-10-CM | POA: Insufficient documentation

## 2012-09-30 DIAGNOSIS — I2510 Atherosclerotic heart disease of native coronary artery without angina pectoris: Secondary | ICD-10-CM | POA: Insufficient documentation

## 2012-09-30 DIAGNOSIS — Z79899 Other long term (current) drug therapy: Secondary | ICD-10-CM | POA: Insufficient documentation

## 2012-09-30 DIAGNOSIS — N401 Enlarged prostate with lower urinary tract symptoms: Secondary | ICD-10-CM | POA: Insufficient documentation

## 2012-09-30 DIAGNOSIS — I871 Compression of vein: Secondary | ICD-10-CM | POA: Insufficient documentation

## 2012-09-30 DIAGNOSIS — I120 Hypertensive chronic kidney disease with stage 5 chronic kidney disease or end stage renal disease: Secondary | ICD-10-CM | POA: Insufficient documentation

## 2012-09-30 DIAGNOSIS — Y832 Surgical operation with anastomosis, bypass or graft as the cause of abnormal reaction of the patient, or of later complication, without mention of misadventure at the time of the procedure: Secondary | ICD-10-CM | POA: Insufficient documentation

## 2012-09-30 DIAGNOSIS — I252 Old myocardial infarction: Secondary | ICD-10-CM | POA: Insufficient documentation

## 2012-09-30 DIAGNOSIS — N139 Obstructive and reflux uropathy, unspecified: Secondary | ICD-10-CM | POA: Insufficient documentation

## 2012-09-30 DIAGNOSIS — T82898A Other specified complication of vascular prosthetic devices, implants and grafts, initial encounter: Secondary | ICD-10-CM | POA: Insufficient documentation

## 2012-09-30 DIAGNOSIS — K3184 Gastroparesis: Secondary | ICD-10-CM | POA: Insufficient documentation

## 2012-09-30 MED ORDER — IOHEXOL 300 MG/ML  SOLN
100.0000 mL | Freq: Once | INTRAMUSCULAR | Status: AC | PRN
  Administered 2012-09-30: 80 mL via INTRAVENOUS

## 2012-09-30 MED ORDER — HEPARIN SODIUM (PORCINE) 1000 UNIT/ML IJ SOLN
INTRAMUSCULAR | Status: AC
  Administered 2012-09-30: 3000 [IU] via INTRAVENOUS
  Filled 2012-09-30: qty 1

## 2012-09-30 MED ORDER — HEPARIN SODIUM (PORCINE) 1000 UNIT/ML IJ SOLN
3000.0000 [IU] | Freq: Once | INTRAMUSCULAR | Status: AC
  Administered 2012-09-30: 3000 [IU] via INTRAVENOUS

## 2012-09-30 NOTE — H&P (Signed)
HPI: [name] is an 62 y.o. male with ESRD and a (L)UE AVF. Has had poor access flows past few HD sessions. Referred for shuntogram and possible intervention if necessary. Shuntogram shows abnormality and pt will require angioplasty, possible stent placement. PMHx and meds reviewed  Past Medical History:  Past Medical History  Diagnosis Date  . Coronary atherosclerosis of native coronary artery   . Other malaise and fatigue   . Chest pain, unspecified   . Other dyspnea and respiratory abnormality   . Personal history of unspecified circulatory disease   . Dysphagia, unspecified   . Internal hemorrhoids without mention of complication   . Hypertrophy of prostate with urinary obstruction and other lower urinary tract symptoms (LUTS)   . Nausea alone   . Gastroparesis   . Backache, unspecified   . Posttraumatic stress disorder   . Hemorrhage of rectum and anus   . Allergy   . Diabetes mellitus 2004  . CHF (congestive heart failure)   . Hyperlipidemia   . Arthritis   . Dizziness   . Headache   . Obesity   . Chills   . Complication of anesthesia     01/2011 could not eat,, hospt x2, was placed on hd and cleared up  . Myocardial infarction 2002  . Stroke     2007  . Unspecified essential hypertension     hx htn   . Chronic kidney disease, stage III (moderate)     HD T- TH-SAT  . Sleep apnea     USES CPAP   . Angina     LAST HAD CP 2 MO AGO  DR WALL [place]    Past Surgical History:  Past Surgical History  Procedure Laterality Date  . Hemorrhoid surgery    . Anal fissurectomy    . Av fistula placement    . Revision of fistula      renal failure  . Cardiac catheterization      2005 DR BRODIE   . Ventral hernia repair  07/01/2011    Procedure: HERNIA REPAIR VENTRAL ADULT;  Surgeon: Clovis Pu. Cornett, MD;  Location: [facility] OR;  Service: General;  Laterality: N/A;    Family History:  Family History  Problem Relation Age of Onset  . Diabetes Sister   . Heart  disease Sister   . Diabetes Maternal Aunt   . Diabetes Maternal Uncle   . Diabetes Paternal Aunt   . Diabetes Paternal Uncle   . Heart disease    . Heart disease Father   . Heart disease Brother     Social History:  reports that he has never smoked. He has never used smokeless tobacco. He reports that he does not drink alcohol or use illicit drugs.  Allergies: No Known Allergies  Medications: Current Outpatient Prescriptions on File Prior to Visit   Medication  Sig  Dispense  Refill   .  allopurinol (ZYLOPRIM) 100 MG tablet  Take 100 mg by mouth 2 (two) times daily.     [name]  aspirin 81 MG tablet  Take 81 mg by mouth daily.     .  calcium acetate (PHOSLO) 667 MG capsule  Take 667 mg by mouth 3 (three) times daily with meals.     .  clopidogrel (PLAVIX) 75 MG tablet  Take 75 mg by mouth at bedtime.     .  diazepam (VALIUM) 5 MG tablet      .  gabapentin (NEURONTIN) 300 MG capsule  Take  300 mg by mouth 2 (two) times daily.     .  insulin glargine (LANTUS) 100 UNIT/ML injection  Inject 90 Units into the skin at bedtime.     .  insulin lispro protamine-insulin lispro (HUMALOG MIX 75/25 KWIKPEN) (75-25) 100 UNIT/ML SUSP  Inject 50 Units into the skin 2 (two) times daily with a meal.  10 mL  12   .  linagliptin (TRADJENTA) 5 MG TABS tablet  Take 5 mg by mouth daily.     .  metoprolol tartrate (LOPRESSOR) 25 MG tablet  Take 12.5 mg by mouth daily.     .  multivitamin (RENA-VIT) TABS tablet  Take 1 tablet by mouth daily.     [name]  omeprazole (PRILOSEC) 20 MG capsule  Take 20 mg by mouth daily.     .  solifenacin (VESICARE) 5 MG tablet  Take 5 mg by mouth daily.     .  Tamsulosin HCl (FLOMAX) 0.4 MG CAPS  Take 0.4 mg by mouth daily.     .  traMADol (ULTRAM) 50 MG tablet  Take 1 tablet (50 mg total) by mouth every 8 (eight) hours as needed for pain. For pain. Take everyday per patient  15 tablet  0      Please HPI for pertinent positives, otherwise complete 10 system ROS negative.  Physical  Exam: There were no vitals taken for this visit. There is no weight on file to calculate BMI.   General Appearance:  Alert, cooperative, no distress, appears stated age  Head:  Normocephalic, without obvious abnormality, atraumatic  ENT: Unremarkable  Neck: Supple, symmetrical, trachea midline,   Lungs:   Clear to auscultation bilaterally, no w/r/r, respirations unlabored without use of accessory muscles.  Chest Wall:  No tenderness or deformity  Heart:  Regular rate and rhythm, S1, S2 normal, no murmur, rub or gallop.   Extremities: Palpable (L)uperr arm AVF, good pulse, thrill  Neurologic: Normal affect, no gross deficits.   No results found for this or any previous visit (from the past 48 hour(s)). No results found.  Assessment/Plan ESRD Poor access flows from (L)UE AVF For completion fistulogram with possible angioplasty, stent Discussed procedure, has been NPO but has no driver and thinks he will be fine without use of sedation Consent signed in chart  Brayton El PA-C 09/30/2012, 10:18 AM

## 2012-10-03 ENCOUNTER — Ambulatory Visit: Payer: Self-pay | Admitting: Nurse Practitioner

## 2012-11-30 ENCOUNTER — Telehealth: Payer: Self-pay | Admitting: Neurology

## 2012-12-12 ENCOUNTER — Ambulatory Visit: Payer: Medicare Other | Admitting: Internal Medicine

## 2012-12-25 ENCOUNTER — Emergency Department (HOSPITAL_COMMUNITY)
Admission: EM | Admit: 2012-12-25 | Discharge: 2012-12-25 | Disposition: A | Discharge status: Home / Self Care | Payer: Medicare Other | Attending: Emergency Medicine | Admitting: Emergency Medicine

## 2012-12-25 ENCOUNTER — Encounter (HOSPITAL_COMMUNITY): Payer: Self-pay | Admitting: Emergency Medicine

## 2012-12-25 DIAGNOSIS — E785 Hyperlipidemia, unspecified: Secondary | ICD-10-CM | POA: Insufficient documentation

## 2012-12-25 DIAGNOSIS — Z992 Dependence on renal dialysis: Secondary | ICD-10-CM | POA: Insufficient documentation

## 2012-12-25 DIAGNOSIS — E1149 Type 2 diabetes mellitus with other diabetic neurological complication: Secondary | ICD-10-CM | POA: Insufficient documentation

## 2012-12-25 DIAGNOSIS — R112 Nausea with vomiting, unspecified: Secondary | ICD-10-CM

## 2012-12-25 DIAGNOSIS — I252 Old myocardial infarction: Secondary | ICD-10-CM | POA: Insufficient documentation

## 2012-12-25 DIAGNOSIS — Z8719 Personal history of other diseases of the digestive system: Secondary | ICD-10-CM | POA: Insufficient documentation

## 2012-12-25 DIAGNOSIS — Z7902 Long term (current) use of antithrombotics/antiplatelets: Secondary | ICD-10-CM | POA: Insufficient documentation

## 2012-12-25 DIAGNOSIS — Z8669 Personal history of other diseases of the nervous system and sense organs: Secondary | ICD-10-CM | POA: Insufficient documentation

## 2012-12-25 DIAGNOSIS — K3184 Gastroparesis: Secondary | ICD-10-CM | POA: Insufficient documentation

## 2012-12-25 DIAGNOSIS — Z7982 Long term (current) use of aspirin: Secondary | ICD-10-CM | POA: Insufficient documentation

## 2012-12-25 DIAGNOSIS — Z79899 Other long term (current) drug therapy: Secondary | ICD-10-CM | POA: Insufficient documentation

## 2012-12-25 DIAGNOSIS — I209 Angina pectoris, unspecified: Secondary | ICD-10-CM | POA: Insufficient documentation

## 2012-12-25 DIAGNOSIS — E669 Obesity, unspecified: Secondary | ICD-10-CM | POA: Insufficient documentation

## 2012-12-25 DIAGNOSIS — N186 End stage renal disease: Secondary | ICD-10-CM | POA: Insufficient documentation

## 2012-12-25 DIAGNOSIS — F4310 Post-traumatic stress disorder, unspecified: Secondary | ICD-10-CM | POA: Insufficient documentation

## 2012-12-25 DIAGNOSIS — I120 Hypertensive chronic kidney disease with stage 5 chronic kidney disease or end stage renal disease: Secondary | ICD-10-CM | POA: Insufficient documentation

## 2012-12-25 DIAGNOSIS — Z794 Long term (current) use of insulin: Secondary | ICD-10-CM | POA: Insufficient documentation

## 2012-12-25 DIAGNOSIS — I509 Heart failure, unspecified: Secondary | ICD-10-CM | POA: Insufficient documentation

## 2012-12-25 DIAGNOSIS — Z8679 Personal history of other diseases of the circulatory system: Secondary | ICD-10-CM | POA: Insufficient documentation

## 2012-12-25 DIAGNOSIS — I2510 Atherosclerotic heart disease of native coronary artery without angina pectoris: Secondary | ICD-10-CM | POA: Insufficient documentation

## 2012-12-25 DIAGNOSIS — G4730 Sleep apnea, unspecified: Secondary | ICD-10-CM | POA: Insufficient documentation

## 2012-12-25 DIAGNOSIS — M549 Dorsalgia, unspecified: Secondary | ICD-10-CM | POA: Insufficient documentation

## 2012-12-25 LAB — CBC
HCT: 45.6 % (ref 39.0–52.0)
Hemoglobin: 15.2 g/dL (ref 13.0–17.0)
MCV: 86.2 fL (ref 78.0–100.0)
RBC: 5.29 MIL/uL (ref 4.22–5.81)
WBC: 4.5 10*3/uL (ref 4.0–10.5)

## 2012-12-25 LAB — COMPREHENSIVE METABOLIC PANEL
BUN: 29 mg/dL — ABNORMAL HIGH (ref 6–23)
CO2: 27 mEq/L (ref 19–32)
Chloride: 91 mEq/L — ABNORMAL LOW (ref 96–112)
Creatinine, Ser: 7.13 mg/dL — ABNORMAL HIGH (ref 0.50–1.35)
GFR calc non Af Amer: 7 mL/min — ABNORMAL LOW (ref >90)
Total Bilirubin: 0.6 mg/dL (ref 0.3–1.2)

## 2012-12-25 LAB — URINALYSIS, ROUTINE W REFLEX MICROSCOPIC
Hgb urine dipstick: NEGATIVE
Ketones, ur: 15 mg/dL — AB
Protein, ur: 100 mg/dL — AB
Urobilinogen, UA: 1 mg/dL (ref 0.0–1.0)

## 2012-12-25 LAB — URINE MICROSCOPIC-ADD ON

## 2012-12-25 MED ORDER — SODIUM CHLORIDE 0.9 % IV BOLUS (SEPSIS)
250.0000 mL | Freq: Once | INTRAVENOUS | Status: AC
  Administered 2012-12-25: 250 mL via INTRAVENOUS

## 2012-12-25 MED ORDER — METOCLOPRAMIDE HCL 10 MG PO TABS: 10.0000 mg | ORAL_TABLET | Freq: Three times a day (TID) | ORAL | Status: DC | PRN

## 2012-12-25 MED ORDER — PANTOPRAZOLE SODIUM 40 MG IV SOLR
40.0000 mg | Freq: Once | INTRAVENOUS | Status: AC
  Administered 2012-12-25: 40 mg via INTRAVENOUS
  Filled 2012-12-25 (×2): qty 40

## 2012-12-25 MED ORDER — CEPHALEXIN 250 MG PO CAPS
500.0000 mg | ORAL_CAPSULE | Freq: Once | ORAL | Status: AC
  Administered 2012-12-25: 500 mg via ORAL
  Filled 2012-12-25: qty 2

## 2012-12-25 MED ORDER — CEPHALEXIN 500 MG PO CAPS: 500.0000 mg | ORAL_CAPSULE | Freq: Every day | ORAL | Status: DC

## 2012-12-25 MED ORDER — ONDANSETRON HCL 4 MG/2ML IJ SOLN
4.0000 mg | Freq: Once | INTRAMUSCULAR | Status: AC
  Administered 2012-12-25: 4 mg via INTRAVENOUS
  Filled 2012-12-25: qty 2

## 2012-12-25 MED ORDER — METOCLOPRAMIDE HCL 5 MG/ML IJ SOLN
10.0000 mg | Freq: Once | INTRAMUSCULAR | Status: AC
  Administered 2012-12-25: 10 mg via INTRAVENOUS
  Filled 2012-12-25: qty 2

## 2012-12-25 NOTE — ED Notes (Signed)
Pt ambulatory upon d/c. Pt given d/c teaching via Josie Dixon, RN. NAD noted upon d/c. Pt leaving with d/c paperwork.

## 2012-12-25 NOTE — ED Notes (Signed)
PT ambulated with baseline gait; VSS; A&Ox3; no signs of distress; respirations even and unlabored; skin warm and dry; no questions upon discharge.  

## 2012-12-25 NOTE — ED Provider Notes (Addendum)
CSN: [id number]     Arrival date & time 12/25/12  1122 History     First MD Initiated Contact with Patient 12/25/12 1129     No chief complaint on file.  (Consider location/radiation/quality/duration/timing/severity/associated sxs/prior Treatment) Patient is a 62 y.o. male presenting with vomiting. The history is provided by the patient.  Emesis Associated symptoms: no abdominal pain, no chills and no headaches   pt with hx esrd/hd t/th/sat, diabetes, gastroparesis, c/o nv for the past 3 days. Episodic. States now w nausea, dry heaves, no actual emesis. Denies any episodes of bloody or bilious emesis. Had a small bm a couple days ago. Denies severe constipation. No abd distension or pain. Denies fever or chills. States at baseline urinates a few times per day, and continues to urinate of normal frequency/amount.  Prior abd surgery includes remote hx umbilical/ventral hernia repair, no swelling or pain at site.   Denies recent change in meds, compliant w normal meds x states hasnt taken today.    Past Medical History  Diagnosis Date  . Coronary atherosclerosis of native coronary artery   . Other malaise and fatigue   . Chest pain, unspecified   . Other dyspnea and respiratory abnormality   . Personal history of unspecified circulatory disease   . Dysphagia, unspecified(787.20)   . Internal hemorrhoids without mention of complication   . Hypertrophy of prostate with urinary obstruction and other lower urinary tract symptoms (LUTS)   . Nausea alone   . Gastroparesis   . Backache, unspecified   . Posttraumatic stress disorder   . Hemorrhage of rectum and anus   . Allergy   . Diabetes mellitus 2004  . CHF (congestive heart failure)   . Hyperlipidemia   . Arthritis   . Dizziness   . Headache(784.0)   . Obesity   . Chills   . Complication of anesthesia     01/2011 could not eat,, hospt x2, was placed on hd and cleared up  . Myocardial infarction 2002  . Stroke     2007  .  Unspecified essential hypertension     hx htn   . Chronic kidney disease, stage III (moderate)     HD T- TH-SAT  . Sleep apnea     USES CPAP   . Angina     LAST HAD CP 2 MO AGO  DR WALL Anderson   Past Surgical History  Procedure Laterality Date  . Hemorrhoid surgery    . Anal fissurectomy    . Av fistula placement    . Revision of fistula      renal failure  . Cardiac catheterization      2005 DR BRODIE   . Ventral hernia repair  07/01/2011    Procedure: HERNIA REPAIR VENTRAL ADULT;  Surgeon: Clovis Pu. Cornett, MD;  Location: [facility] OR;  Service: General;  Laterality: N/A;   Family History  Problem Relation Age of Onset  . Diabetes Sister   . Heart disease Sister   . Diabetes Maternal Aunt   . Diabetes Maternal Uncle   . Diabetes Paternal Aunt   . Diabetes Paternal Uncle   . Heart disease    . Heart disease Father   . Heart disease Brother    History  Substance Use Topics  . Smoking status: Never Smoker   . Smokeless tobacco: Never Used  . Alcohol Use: No    Review of Systems  Constitutional: Negative for fever and chills.  HENT: Negative for neck pain.  Eyes: Negative for redness.  Respiratory: Negative for cough and shortness of breath.   Cardiovascular: Negative for chest pain and leg swelling.  Gastrointestinal: Positive for nausea and vomiting. Negative for abdominal pain.  Genitourinary: Negative for dysuria and flank pain.  Musculoskeletal: Negative for back pain.  Skin: Negative for rash.  Neurological: Negative for headaches.  Hematological: Does not bruise/bleed easily.  Psychiatric/Behavioral: Negative for confusion.    Allergies  Review of patient's allergies indicates no known allergies.  Home Medications   Current Outpatient Rx  Name  Route  Sig  Dispense  Refill  . allopurinol (ZYLOPRIM) 100 MG tablet   Oral   Take 100 mg by mouth 2 (two) times daily.          [name] aspirin 81 MG tablet   Oral   Take 81 mg by mouth daily.           .  calcium acetate (PHOSLO) 667 MG capsule   Oral   Take 667 mg by mouth 3 (three) times daily with meals.         . clopidogrel (PLAVIX) 75 MG tablet   Oral   Take 75 mg by mouth at bedtime.          . diazepam (VALIUM) 5 MG tablet               . gabapentin (NEURONTIN) 300 MG capsule   Oral   Take 300 mg by mouth 2 (two) times daily.         . insulin glargine (LANTUS) 100 UNIT/ML injection   Subcutaneous   Inject 90 Units into the skin at bedtime.           . insulin lispro protamine-insulin lispro (HUMALOG MIX 75/25 KWIKPEN) (75-25) 100 UNIT/ML SUSP   Subcutaneous   Inject 50 Units into the skin 2 (two) times daily with a meal.   10 mL   12   . linagliptin (TRADJENTA) 5 MG TABS tablet   Oral   Take 1 tablet (5 mg total) by mouth daily.   30 tablet   6   . metoprolol tartrate (LOPRESSOR) 25 MG tablet   Oral   Take 12.5 mg by mouth daily.         . multivitamin (RENA-VIT) TABS tablet   Oral   Take 1 tablet by mouth daily.         [name] omeprazole (PRILOSEC) 20 MG capsule   Oral   Take 20 mg by mouth daily.         . solifenacin (VESICARE) 5 MG tablet   Oral   Take 5 mg by mouth daily.          . Tamsulosin HCl (FLOMAX) 0.4 MG CAPS   Oral   Take 0.4 mg by mouth daily.          [name] topiramate (TOPAMAX) 50 MG tablet      TAKE 1 TABLET AT NIGHT FOR 1 WEEK, THEN TWICE A DAY   180 tablet   3   . traMADol (ULTRAM) 50 MG tablet   Oral   Take 1 tablet (50 mg total) by mouth every 8 (eight) hours as needed for pain. For pain. Take everyday per patient   15 tablet   0    BP 142/86  Pulse 80  Temp(Src) 98.8 F (37.1 C) (Oral)  Resp 18  SpO2 100% Physical Exam  Nursing note and vitals reviewed. Constitutional: He is oriented to person, place, and  time. He appears well-developed and well-nourished. No distress.  HENT:  Head: Atraumatic.  Mouth/Throat: Oropharynx is clear and moist.  Eyes: Conjunctivae are normal. No scleral icterus.  Neck:  Neck supple. No tracheal deviation present.  Cardiovascular: Normal rate, regular rhythm, normal heart sounds and intact distal pulses.   Pulmonary/Chest: Effort normal and breath sounds normal. No accessory muscle usage. No respiratory distress.  Abdominal: Soft. Bowel sounds are normal. He exhibits no distension and no mass. There is no tenderness. There is no rebound and no guarding.  No hernia  Genitourinary:  No cva tenderness  Musculoskeletal: Normal range of motion. He exhibits no edema and no tenderness.  Neurological: He is alert and oriented to person, place, and time.  Skin: Skin is warm and dry.  Psychiatric: He has a normal mood and affect.    ED Course   Procedures (including critical care time)  Results for orders placed during the hospital encounter of 12/25/12  CBC      Result Value Range   WBC 4.5  4.0 - 10.5 K/uL   RBC 5.29  4.22 - 5.81 MIL/uL   Hemoglobin 15.2  13.0 - 17.0 g/dL   HCT 40.9  81.1 - 91.4 %   MCV 86.2  78.0 - 100.0 fL   MCH 28.7  26.0 - 34.0 pg   MCHC 33.3  30.0 - 36.0 g/dL   RDW 78.2  95.6 - 21.3 %   Platelets 195  150 - 400 K/uL  COMPREHENSIVE METABOLIC PANEL      Result Value Range   Sodium 135  135 - 145 mEq/L   Potassium 4.6  3.5 - 5.1 mEq/L   Chloride 91 (*) 96 - 112 mEq/L   CO2 27  19 - 32 mEq/L   Glucose, Bld 102 (*) 70 - 99 mg/dL   BUN 29 (*) 6 - 23 mg/dL   Creatinine, Ser 0.86 (*) 0.50 - 1.35 mg/dL   Calcium 57.8  8.4 - 46.9 mg/dL   Total Protein 7.7  6.0 - 8.3 g/dL   Albumin 3.8  3.5 - 5.2 g/dL   AST 19  0 - 37 U/L   ALT 9  0 - 53 U/L   Alkaline Phosphatase 69  39 - 117 U/L   Total Bilirubin 0.6  0.3 - 1.2 mg/dL   GFR calc non Af Amer 7 (*) >90 mL/min   GFR calc Af Amer 8 (*) >90 mL/min  URINALYSIS, ROUTINE W REFLEX MICROSCOPIC      Result Value Range   Color, Urine AMBER (*) YELLOW   APPearance CLOUDY (*) CLEAR   Specific Gravity, Urine 1.017  1.005 - 1.030   pH 6.0  5.0 - 8.0   Glucose, UA NEGATIVE  NEGATIVE mg/dL    Hgb urine dipstick NEGATIVE  NEGATIVE   Bilirubin Urine MODERATE (*) NEGATIVE   Ketones, ur 15 (*) NEGATIVE mg/dL   Protein, ur 629 (*) NEGATIVE mg/dL   Urobilinogen, UA 1.0  0.0 - 1.0 mg/dL   Nitrite NEGATIVE  NEGATIVE   Leukocytes, UA MODERATE (*) NEGATIVE  URINE MICROSCOPIC-ADD ON      Result Value Range   Squamous Epithelial / LPF RARE  RARE   WBC, UA 7-10  <3 WBC/hpf   Casts HYALINE CASTS (*) NEGATIVE   Urine-Other MUCOUS PRESENT         MDM  Iv ns 250 cc bolus.   reglan iv. zofran iv. protonix iv.   Labs.  Reviewed nursing notes and prior  charts for additional history.    Date: 12/25/2012  Rate: 81  Rhythm: normal sinus rhythm  QRS Axis: left  Intervals: normal  ST/T Wave abnormalities: nonspecific ST/T changes  Conduction Disutrbances:none  Narrative Interpretation:   Old EKG Reviewed: unchanged   Additional ns 250 cc iv..  Recheck abd soft nt.    No nv. Tolerating po.    Pt states feels much improved.  Pt appears stable for d/c.   Possible uti on labs. u cx. Keflex po. Pt is hd pt, will rx keflex.     Suzi Roots, MD 12/25/12 [phone]

## 2012-12-25 NOTE — ED Notes (Signed)
Pt states nausea and vomiting since Wednesday. Pt states he is less than his dry weight. Pt went to dialysis yesterday. Pt states feeling sick and weak. Pt alert and mentating appropriately.

## 2012-12-30 ENCOUNTER — Ambulatory Visit: Payer: Medicare Other | Admitting: Internal Medicine

## 2013-01-04 ENCOUNTER — Ambulatory Visit: Payer: Medicare Other | Admitting: Internal Medicine

## 2013-01-13 ENCOUNTER — Ambulatory Visit: Payer: Medicare Other | Admitting: Family

## 2013-01-13 DIAGNOSIS — Z0289 Encounter for other administrative examinations: Secondary | ICD-10-CM

## 2013-04-03 DIAGNOSIS — R93429 Abnormal radiologic findings on diagnostic imaging of unspecified kidney: Secondary | ICD-10-CM | POA: Insufficient documentation

## 2013-04-03 DIAGNOSIS — E669 Obesity, unspecified: Secondary | ICD-10-CM | POA: Insufficient documentation

## 2013-04-03 DIAGNOSIS — Z8673 Personal history of transient ischemic attack (TIA), and cerebral infarction without residual deficits: Secondary | ICD-10-CM | POA: Insufficient documentation

## 2013-04-03 DIAGNOSIS — Z955 Presence of coronary angioplasty implant and graft: Secondary | ICD-10-CM | POA: Insufficient documentation

## 2013-04-03 DIAGNOSIS — N529 Male erectile dysfunction, unspecified: Secondary | ICD-10-CM | POA: Insufficient documentation

## 2013-04-06 ENCOUNTER — Other Ambulatory Visit: Payer: Self-pay | Admitting: Nephrology

## 2013-04-06 DIAGNOSIS — N289 Disorder of kidney and ureter, unspecified: Secondary | ICD-10-CM

## 2013-04-19 ENCOUNTER — Ambulatory Visit
Admission: RE | Admit: 2013-04-19 | Discharge: 2013-04-19 | Disposition: A | Discharge status: Home / Self Care | Payer: Non-veteran care | Source: Ambulatory Visit | Attending: Nephrology | Admitting: Nephrology

## 2013-04-19 DIAGNOSIS — N289 Disorder of kidney and ureter, unspecified: Secondary | ICD-10-CM

## 2013-04-27 ENCOUNTER — Telehealth: Payer: Self-pay | Admitting: Internal Medicine

## 2013-04-27 ENCOUNTER — Other Ambulatory Visit: Payer: Self-pay | Admitting: *Deleted

## 2013-04-27 DIAGNOSIS — E119 Type 2 diabetes mellitus without complications: Secondary | ICD-10-CM

## 2013-04-27 MED ORDER — INSULIN LISPRO PROT & LISPRO (75-25 MIX) 100 UNIT/ML ~~LOC~~ SUSP: 50.0000 [IU] | Freq: Two times a day (BID) | SUBCUTANEOUS | Status: DC

## 2013-04-27 NOTE — Telephone Encounter (Signed)
Done

## 2013-04-27 NOTE — Telephone Encounter (Signed)
Pt request refill: insulin lispro protamine-insulin lispro (HUMALOG MIX 75/25 KWIKPEN) (75-25) 100 UNIT/ML SUSP And the needles that go w/ it. [facility]

## 2013-05-02 ENCOUNTER — Encounter: Payer: Self-pay | Admitting: Neurology

## 2013-05-02 ENCOUNTER — Ambulatory Visit (INDEPENDENT_AMBULATORY_CARE_PROVIDER_SITE_OTHER): Payer: Medicare Other | Admitting: Neurology

## 2013-05-02 ENCOUNTER — Encounter (INDEPENDENT_AMBULATORY_CARE_PROVIDER_SITE_OTHER): Payer: Self-pay

## 2013-05-02 ENCOUNTER — Ambulatory Visit: Payer: Self-pay | Admitting: Neurology

## 2013-05-02 VITALS — BP 121/73 | HR 91 | Temp 99.9°F | Ht 70.0 in | Wt 225.0 lb

## 2013-05-02 DIAGNOSIS — M21372 Foot drop, left foot: Secondary | ICD-10-CM | POA: Insufficient documentation

## 2013-05-02 DIAGNOSIS — M216X9 Other acquired deformities of unspecified foot: Secondary | ICD-10-CM

## 2013-05-02 MED ORDER — TOPIRAMATE 50 MG PO TABS: 100.0000 mg | ORAL_TABLET | Freq: Two times a day (BID) | ORAL | Status: DC

## 2013-05-02 NOTE — Progress Notes (Signed)
[facility] [address] [place]. [place] [address] [phone]       OFFICE FOLLOW-UP NOTE  [name] Date of Birth:  15-Mar-1951 Medical Record Number:  [id number]   HPI:  40 year African American male with chronic low back pain and bilateral sciatica from L5-S1 radiculopathy secondary to degenerative lumbar spine disease with refractory pain despite several epidural steroid injections . He is seen today urgently upon his request as he showed up without an appointment asking to be seen. She was last seen on 06/29/12 by Darrol Angel nurse practitioner. He states in the last couple of months his left leg weakness, numbness, pain and leg giving out has bothered him. He is falling several times because his leg he is weak and gives out from the hip down. He has no control over his toes and her any feeling in his foot. He complains of severe back pain and ankle pain intermittently as well as cramps and pain and paresthesias in his left foot mainly at night. He is currently taking gabapentin 300 mg twice daily and Topamax 50 mg twice daily and tolerating them well. In the past the Topamax dose has not been increased because of concerns from his nephrologist about hurting his kidneys. History trying to get onto the transplant list but the final decision from [facility] is pending. He has not had any recent imaging studies done. He ambulates with a wheeled walker feels that at times is like can give out and is scared that is going to fall.  ROS:   14 system review of systems is positive for fatigue, lites and 30, double vision, like swelling, excessive thirst, rectal bleeding, joint pain, swelling, back pain, aching muscle, muscle cramps, walking difficulty and coordination problems and all other systems are negative.  PMH:  Past Medical History  Diagnosis Date  . Coronary atherosclerosis of native coronary artery   . Other malaise and fatigue   . Chest pain,  unspecified   . Other dyspnea and respiratory abnormality   . Personal history of unspecified circulatory disease   . Dysphagia, unspecified(787.20)   . Internal hemorrhoids without mention of complication   . Hypertrophy of prostate with urinary obstruction and other lower urinary tract symptoms (LUTS)   . Nausea alone   . Gastroparesis   . Backache, unspecified   . Posttraumatic stress disorder   . Hemorrhage of rectum and anus   . Allergy   . Diabetes mellitus 2004  . CHF (congestive heart failure)   . Hyperlipidemia   . Arthritis   . Dizziness   . Headache(784.0)   . Obesity   . Chills   . Complication of anesthesia     01/2011 could not eat,, hospt x2, was placed on hd and cleared up  . Myocardial infarction 2002  . Stroke     2007  . Unspecified essential hypertension     hx htn   . Chronic kidney disease, stage III (moderate)     HD T- TH-SAT  . Sleep apnea     USES CPAP   . Angina     LAST HAD CP 2 MO AGO  DR WALL Burket    Social History:  History   Social History  . Marital Status: Married    Spouse Name: N/A    Number of Children: N/A  . Years of Education: N/A   Occupational History  . disabled    Social History Main Topics  . Smoking status:  Never Smoker   . Smokeless tobacco: Never Used  . Alcohol Use: No  . Drug Use: No  . Sexual Activity: Not Currently   Other Topics Concern  . Not on file   Social History Narrative   Patient lives at home with    Medications:   Current Outpatient Prescriptions on File Prior to Visit  Medication Sig Dispense Refill  . allopurinol (ZYLOPRIM) 100 MG tablet Take 100 mg by mouth 2 (two) times daily.       [name] aspirin 81 MG tablet Take 81 mg by mouth daily.        . calcium acetate (PHOSLO) 667 MG capsule Take 667 mg by mouth 3 (three) times daily with meals.      . clopidogrel (PLAVIX) 75 MG tablet Take 75 mg by mouth at bedtime.       . diazepam (VALIUM) 5 MG tablet Take 5 mg by mouth at bedtime.       .  gabapentin (NEURONTIN) 300 MG capsule Take 300 mg by mouth 2 (two) times daily.      . insulin glargine (LANTUS) 100 UNIT/ML injection Inject 90 Units into the skin at bedtime.        . insulin lispro protamine-lispro (HUMALOG 75/25) (75-25) 100 UNIT/ML SUSP injection Inject 50 Units into the skin 2 (two) times daily with a meal.  30 mL  12  . metoCLOPramide (REGLAN) 10 MG tablet Take 1 tablet (10 mg total) by mouth 3 (three) times daily as needed.  20 tablet  0  . metoprolol tartrate (LOPRESSOR) 25 MG tablet Take 12.5 mg by mouth daily.      . multivitamin (RENA-VIT) TABS tablet Take 1 tablet by mouth every morning.       [name] omeprazole (PRILOSEC) 20 MG capsule Take 20 mg by mouth daily.      . solifenacin (VESICARE) 5 MG tablet Take 5 mg by mouth every morning.       . Tamsulosin HCl (FLOMAX) 0.4 MG CAPS Take 0.4 mg by mouth every morning.       . traMADol (ULTRAM) 50 MG tablet Take 1 tablet (50 mg total) by mouth every 8 (eight) hours as needed for pain. For pain. Take everyday per patient  15 tablet  0   No current facility-administered medications on file prior to visit.    Allergies:  No Known Allergies  Physical Exam General: well developed, well nourished, seated, in no evident distress Head: head normocephalic and atraumatic. Orohparynx benign Neck: supple with no carotid or supraclavicular bruits Cardiovascular: regular rate and rhythm, no murmurs Musculoskeletal: no deformity Skin:  no rash/petichiae Vascular:  Normal pulses all extremities Filed Vitals:   05/02/13 1558  BP: 121/73  Pulse: 91  Temp: 99.9 F (37.7 C)    Neurologic Exam Mental Status: Awake and fully alert. Oriented to place and time. Recent and remote memory intact. Attention span, concentration and fund of knowledge appropriate. Mood and affect appropriate.  Cranial Nerves: Fundoscopic exam reveals sharp disc margins. Pupils equal, briskly reactive to light. Extraocular movements full without nystagmus.  Visual fields full to confrontation. Hearing intact. Facial sensation intact. Face, tongue, palate moves normally and symmetrically.  Motor: Diminished bulk and tone in the left leg from ankle down. Left knee extensors and flexors 4/5 Left foot drop with 0/5 ankle dorsiflexors 1/5 ankle plantar flexors and left leg evertors Left leg invertors are preserved. Sensory.: Diminished touch and pin prick sensation in the left leg from the knee  down and right leg from ankle down. Absent vibration sensation in the left foot and ankle and diminished on the right. Position sense is impaired in the left foot. Romberg sign is positive.  Coordination: Rapid alternating movements normal in all extremities. Finger-to-nose and heel-to-shin performed accurately bilaterally. Gait and Station: Arises from chair without difficulty. Stance is broad-based. Gait demonstrates mild ataxia with left foot drop and dragging of the left leg  Reflexes: 1+ and symmetric except ankle jerks depressed bilaterally. Toes downgoing.       ASSESSMENT: 19 year African American male with chronic low back pain and bilateral sciatica from L5-S1 radiculopathy secondary to degenerative lumbar spine disease with refractory pain despite several epidural steroid injections with new complaints of left leg giving out and foot drop over the last 3 months. Differentials include peroneal neuropathy versus left L5-S1 radiculopathy. Remote history of stroke in 2007. Underlying diabetic peripheral neuropathy as well.     PLAN: Increase Topamax to 50 mg in the morning and 100 mg at night for 1 week and then if tolerated without side effects 100 twice a day to help with this pain and paresthesias. Check EMG nerve conduction studies as well as MRI scan lumbar spine to evaluate his new left foot drop. Continue to use a wheeled walker at all times and avoid falls and injuries. Return for followup in 6 weeks her call earlier if necessary.

## 2013-05-02 NOTE — Patient Instructions (Signed)
Increase Topamax to 50 mg in the morning and 100 mg at night for 1 week and then if tolerated without side effects 100 twice a day to help with this pain and paresthesias. Check EMG nerve conduction studies as well as MRI scan lumbar spine to evaluate his new left foot drop. Continue to use a wheeled walker at all times and avoid falls and injuries. Return for followup in 6 weeks her call earlier if necessary.

## 2013-05-03 ENCOUNTER — Telehealth: Payer: Self-pay | Admitting: Neurology

## 2013-05-03 NOTE — Telephone Encounter (Signed)
Left message for patient to schedule Nerve conduction and EMG per Dr Pearlean Brownie.

## 2013-05-23 ENCOUNTER — Inpatient Hospital Stay
Admission: RE | Admit: 2013-05-23 | Discharge: 2013-05-23 | Disposition: A | Discharge status: Home / Self Care | Payer: Non-veteran care | Source: Ambulatory Visit | Attending: Neurology | Admitting: Neurology

## 2013-05-31 ENCOUNTER — Telehealth: Payer: Self-pay | Admitting: Neurology

## 2013-05-31 MED ORDER — TOPIRAMATE 50 MG PO TABS: 100.0000 mg | ORAL_TABLET | Freq: Two times a day (BID) | ORAL | Status: DC

## 2013-05-31 NOTE — Telephone Encounter (Signed)
It appears Rx ws sent for 15 day supply at last OV in error.  Thee Rx has been updated to a 30 day supply and resent. I called the patient.  He is aware.

## 2013-06-08 ENCOUNTER — Telehealth: Payer: Self-pay | Admitting: Neurology

## 2013-06-08 NOTE — Telephone Encounter (Signed)
Pt is trying to get his Rx's through the [place].  Tye Maryland stated in order to do that Dr Atha Starks would need the most recent offices notes that state why the patient is on Topamax.  Tye Maryland asked if these notes could be faxed to [phone] Attn: Dr Atha Starks or Tye Maryland.  Tye Maryland did not have a direct extension and stated it would a while for anyone to get through to her thru their 800#.

## 2013-06-08 NOTE — Telephone Encounter (Signed)
I do not think we can send OV notes to another practice without a release form.  Will verify this info with Medical Records.

## 2013-06-16 ENCOUNTER — Ambulatory Visit
Admission: RE | Admit: 2013-06-16 | Discharge: 2013-06-16 | Disposition: A | Discharge status: Home / Self Care | Payer: Medicare Other | Source: Ambulatory Visit | Attending: Neurology | Admitting: Neurology

## 2013-06-16 ENCOUNTER — Telehealth: Payer: Self-pay | Admitting: Internal Medicine

## 2013-06-16 DIAGNOSIS — M216X9 Other acquired deformities of unspecified foot: Secondary | ICD-10-CM

## 2013-06-16 DIAGNOSIS — M21372 Foot drop, left foot: Secondary | ICD-10-CM

## 2013-06-16 NOTE — Telephone Encounter (Signed)
Yes ,put them with coumadin clinic on mon or thursday

## 2013-06-16 NOTE — Telephone Encounter (Signed)
Pt will have  6 teeth extractions soon and his dental would like for him to have INR done. Pt is taking plavix. Can I sch?

## 2013-06-16 NOTE — Telephone Encounter (Signed)
Pt sch for monday

## 2013-06-19 ENCOUNTER — Other Ambulatory Visit (INDEPENDENT_AMBULATORY_CARE_PROVIDER_SITE_OTHER): Payer: Medicare Other

## 2013-06-19 ENCOUNTER — Other Ambulatory Visit: Payer: Self-pay | Admitting: *Deleted

## 2013-06-19 ENCOUNTER — Ambulatory Visit: Payer: Medicare Other

## 2013-06-19 DIAGNOSIS — Z9289 Personal history of other medical treatment: Secondary | ICD-10-CM

## 2013-06-19 DIAGNOSIS — Z8673 Personal history of transient ischemic attack (TIA), and cerebral infarction without residual deficits: Secondary | ICD-10-CM

## 2013-06-19 NOTE — Addendum Note (Signed)
Addended by: Donnita Falls on: 06/19/2013 09:13 AM   Modules accepted: Orders

## 2013-06-20 ENCOUNTER — Ambulatory Visit: Payer: Medicare Other | Admitting: Family

## 2013-06-22 ENCOUNTER — Other Ambulatory Visit: Payer: Self-pay

## 2013-06-22 MED ORDER — TOPIRAMATE 50 MG PO TABS: 100.0000 mg | ORAL_TABLET | Freq: Two times a day (BID) | ORAL | Status: DC

## 2013-07-21 ENCOUNTER — Telehealth: Payer: Self-pay | Admitting: Internal Medicine

## 2013-07-21 ENCOUNTER — Other Ambulatory Visit: Payer: Self-pay | Admitting: *Deleted

## 2013-07-21 DIAGNOSIS — E119 Type 2 diabetes mellitus without complications: Secondary | ICD-10-CM

## 2013-07-21 MED ORDER — INSULIN LISPRO PROT & LISPRO (75-25 MIX) 100 UNIT/ML ~~LOC~~ SUSP: 50.0000 [IU] | Freq: Two times a day (BID) | SUBCUTANEOUS | Status: DC

## 2013-07-21 NOTE — Telephone Encounter (Signed)
done

## 2013-07-21 NOTE — Telephone Encounter (Signed)
Pt is needing new rx for insulin lispro protamine-lispro (HUMALOG 75/25) (75-25) 100 UNIT/ML SUSP injection, pt states pharmacy is stating that his prescription is old and they are requesting a new rx. Pt states the reason being is because he only eats 2 meals a day so he is not using it as fast as he should. [facility] [address].

## 2013-08-02 LAB — HM DIABETES EYE EXAM

## 2013-08-08 ENCOUNTER — Telehealth: Payer: Self-pay | Admitting: Neurology

## 2013-08-08 MED ORDER — TOPIRAMATE 100 MG PO TABS: 100.0000 mg | ORAL_TABLET | Freq: Two times a day (BID) | ORAL | Status: DC

## 2013-08-08 NOTE — Telephone Encounter (Signed)
Rx has been updated and sent.  Total dose remains the same.

## 2013-08-08 NOTE — Telephone Encounter (Signed)
[facility] called and stated that the 50 mg tabs for Topomax has been on BO for awhile.  She asked if it could be changed to the 100 mg tabs that they have in stock.  Please call to discuss as necessary.  Thank you.

## 2013-10-24 ENCOUNTER — Encounter: Payer: Self-pay | Admitting: Internal Medicine

## 2013-11-16 ENCOUNTER — Other Ambulatory Visit: Payer: Self-pay | Admitting: *Deleted

## 2013-11-16 MED ORDER — ACCU-CHEK AVIVA DEVI
Status: AC
Start: 2013-11-16 — End: 2014-11-16

## 2013-11-16 MED ORDER — ACCU-CHEK SOFTCLIX LANCETS MISC: Status: DC

## 2013-11-16 MED ORDER — GLUCOSE BLOOD VI STRP: ORAL_STRIP | Status: DC

## 2014-01-03 ENCOUNTER — Ambulatory Visit (INDEPENDENT_AMBULATORY_CARE_PROVIDER_SITE_OTHER): Payer: Medicare Other | Admitting: Family Medicine

## 2014-01-03 ENCOUNTER — Encounter: Payer: Self-pay | Admitting: Family Medicine

## 2014-01-03 VITALS — BP 112/74 | HR 94 | Temp 100.1°F | Ht 69.0 in | Wt 234.0 lb

## 2014-01-03 DIAGNOSIS — M6530 Trigger finger, unspecified finger: Secondary | ICD-10-CM

## 2014-01-03 DIAGNOSIS — M65311 Trigger thumb, right thumb: Secondary | ICD-10-CM | POA: Insufficient documentation

## 2014-01-03 NOTE — Progress Notes (Signed)
Garret Reddish, MD Phone: [phone]  Subjective:   [name] is a 63 y.o. year old very pleasant male patient who presents with the following:  Right thumb pain Left arm has not been largely functional due to dialysis and trouble with coordination.   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  Trouble putting on clothes with buttoning buttons. Son is having to help him get dressed. CMC and MCP joints both very painful and also notices a click with moving IP joint. Pain shoots to the wrist when this happens. Pain stays about 8/10 with sharp pains.  ROS- states was drinking coffee before came in-denies fevers/chills. No redness at joint.   Past Medical History- Previously Dr. Arnoldo Morale had sent him to [facility] orthopedics after trial of celebrex. Had PT and had some improvement for osme time and later converted to tylenol ibuprofen.  ESRD. History of gout on allopurinol and that is usually in the feet. DM followed by endocrine. Left foot drop. PTSD. HTN. Hx TIA. BPH. Neuropathy.   Medications- reviewed and updated Current Outpatient Prescriptions  Medication Sig Dispense Refill  . ACCU-CHEK SOFTCLIX LANCETS lancets Use three times daily  200 each  3  . allopurinol (ZYLOPRIM) 100 MG tablet Take 100 mg by mouth 2 (two) times daily.       [name] aspirin 81 MG tablet Take 81 mg by mouth daily.        . Blood Glucose Monitoring Suppl (ACCU-CHEK AVIVA) device Use as instructed  1 each  0  . calcium acetate (PHOSLO) 667 MG capsule Take 667 mg by mouth 3 (three) times daily with meals.      . clopidogrel (PLAVIX) 75 MG tablet Take 75 mg by mouth at bedtime.       . diazepam (VALIUM) 5 MG tablet Take 5 mg by mouth at bedtime.       . gabapentin (NEURONTIN) 300 MG capsule Take 300 mg by mouth 2 (two) times daily.      [name] glucose blood (ACCU-CHEK AVIVA PLUS) test strip Test three times daily  200 each  3  . insulin glargine (LANTUS) 100 UNIT/ML injection  Inject 90 Units into the skin at bedtime.        . insulin lispro protamine-lispro (HUMALOG 75/25 MIX) (75-25) 100 UNIT/ML SUSP injection Inject 50 Units into the skin 2 (two) times daily with a meal.  30 mL  12  . metoprolol tartrate (LOPRESSOR) 25 MG tablet Take 12.5 mg by mouth daily.      . multivitamin (RENA-VIT) TABS tablet Take 1 tablet by mouth every morning.       [name] omeprazole (PRILOSEC) 20 MG capsule Take 20 mg by mouth daily.      . solifenacin (VESICARE) 5 MG tablet Take 5 mg by mouth every morning.       . Tamsulosin HCl (FLOMAX) 0.4 MG CAPS Take 0.4 mg by mouth every morning.       . topiramate (TOPAMAX) 100 MG tablet Take 1 tablet (100 mg total) by mouth 2 (two) times daily.  60 tablet  5  . traMADol (ULTRAM) 50 MG tablet Take 1 tablet (50 mg total) by mouth every 8 (eight) hours as needed for pain. For pain. Take everyday per patient  15 tablet  0   No current facility-administered medications for this visit.    Objective: BP 112/74  Pulse 94  Temp(Src) 100.1 F (37.8 C)  Ht 5\' 9"  (  1.753 m)  Wt 234 lb (106.142 kg)  BMI 34.54 kg/m2 Gen: NAD, resting comfortably in chair Right hand: Pain on all sides of MCP but most severe on palmar side. Click noted at MCP when patient moves IP joint. Very mild tenderness at [facility]. Full range of motion. Intact distal sensation.   Assessment/Plan:  Trigger thumb of right hand Attempted to place patient in splint but none available in office. Gave handwritten Rx and advised to try [facility]. Advised icing. Refer to Dr. Charlann Boxer of [facility] for consideration of trigger point injection. Patient declined trial of oral steroids (of note is diabetic and we discussed even injections can raise CBGs). Patient states his diabetes has bene well controlled since establishing with endocrine.   No nsaids as ESRD. Tylenol prn.    Orders Placed This Encounter  Procedures  . Ambulatory referral to Sports Medicine     Referral Priority:  Routine    Referral Type:  Consultation    Referred to Provider:  Lyndal Pulley, DO    Number of Visits Requested:  1

## 2014-01-03 NOTE — Assessment & Plan Note (Signed)
Attempted to place patient in splint but none available in office. Gave handwritten Rx and advised to try [facility]. Advised icing. Refer to Dr. Charlann Boxer of [facility] for consideration of trigger point injection. Patient declined trial of oral steroids (of note is diabetic and we discussed even injections can raise CBGs). Patient states his diabetes has bene well controlled since establishing with endocrine.

## 2014-01-03 NOTE — Patient Instructions (Signed)
I am referring you to Dr. Charlann Boxer. He will consider an injection. Please ice and use splint (if they can find you one) until you get to the appointment with him. Drop off your forms and I will complete what I am able to do. Likely, we will need to have our face to face visit in November to complete everything needed.   Trigger Finger Trigger finger (digital tendinitis and stenosing tenosynovitis) is a common disorder that causes an often painful catching of the fingers or thumb. It occurs as a clicking, snapping, or locking of a finger in the palm of the hand. This is caused by a problem with the tendons that flex or bend the fingers sliding smoothly through their sheaths. The condition may occur in any finger or a couple fingers at the same time.  The finger may lock with the finger curled or suddenly straighten out with a snap. This is more common in patients with rheumatoid arthritis and diabetes. Left untreated, the condition may get worse to the point where the finger becomes locked in flexion, like making a fist, or less commonly locked with the finger straightened out. CAUSES   Inflammation and scarring that lead to swelling around the tendon sheath.  Repeated or forceful movements.  Rheumatoid arthritis, an autoimmune disease that affects joints.  Gout.  Diabetes mellitus. SIGNS AND SYMPTOMS  Soreness and swelling of your finger.  A painful clicking or snapping as you bend and straighten your finger. DIAGNOSIS  Your health care provider will do a physical exam of your finger to diagnose trigger finger. TREATMENT   Splinting for 6-8 weeks may be helpful.  Nonsteroidal anti-inflammatory medicines (NSAIDs) can help to relieve the pain and inflammation.  Cortisone injections, along with splinting, may speed up recovery. Several injections may be required. Cortisone may give relief after one injection.  Surgery is another treatment that may be used if conservative treatments do  not work. Surgery can be minor, without incisions (a cut does not have to be made), and can be done with a needle through the skin.  Other surgical choices involve an open procedure in which the surgeon opens the hand through a small incision and cuts the pulley so the tendon can again slide smoothly. Your hand will still work fine. HOME CARE INSTRUCTIONS  Apply ice to the injured area, twice per day:  Put ice in a plastic bag.  Place a towel between your skin and the bag.  Leave the ice on for 20 minutes, 3-4 times a day.  Rest your hand often. MAKE SURE YOU:   Understand these instructions.  Will watch your condition.  Will get help right away if you are not doing well or get worse. Document Released: 02/29/2004 Document Revised: 01/11/2013 Document Reviewed: 10/11/2012 [facility] Patient Information 2015 [place], [place]. This information is not intended to replace advice given to you by your health care provider. Make sure you discuss any questions you have with your health care provider.

## 2014-01-05 ENCOUNTER — Telehealth: Payer: Self-pay | Admitting: Family Medicine

## 2014-01-05 ENCOUNTER — Ambulatory Visit: Payer: Medicare Other | Admitting: Family Medicine

## 2014-01-05 DIAGNOSIS — Z0289 Encounter for other administrative examinations: Secondary | ICD-10-CM

## 2014-01-05 NOTE — Telephone Encounter (Signed)
Patient no showed for new patient appt on 08/14.  Please advise.

## 2014-01-05 NOTE — Telephone Encounter (Signed)
Noted  

## 2014-01-15 ENCOUNTER — Telehealth: Payer: Self-pay | Admitting: Internal Medicine

## 2014-01-15 NOTE — Telephone Encounter (Signed)
erro

## 2014-01-24 ENCOUNTER — Other Ambulatory Visit (INDEPENDENT_AMBULATORY_CARE_PROVIDER_SITE_OTHER): Payer: Medicare Other

## 2014-01-24 ENCOUNTER — Ambulatory Visit (INDEPENDENT_AMBULATORY_CARE_PROVIDER_SITE_OTHER): Payer: Medicare Other | Admitting: Family Medicine

## 2014-01-24 ENCOUNTER — Encounter: Payer: Self-pay | Admitting: Family Medicine

## 2014-01-24 VITALS — BP 120/70 | HR 86 | Ht 70.5 in | Wt 233.0 lb

## 2014-01-24 DIAGNOSIS — M79644 Pain in right finger(s): Secondary | ICD-10-CM

## 2014-01-24 DIAGNOSIS — M65311 Trigger thumb, right thumb: Secondary | ICD-10-CM

## 2014-01-24 DIAGNOSIS — M79609 Pain in unspecified limb: Secondary | ICD-10-CM

## 2014-01-24 DIAGNOSIS — M6530 Trigger finger, unspecified finger: Secondary | ICD-10-CM

## 2014-01-24 NOTE — Progress Notes (Addendum)
  [facility] [address], [place] [address] Phone: [phone] Subjective:    I'm seeing this patient by the request  of:  Dr. Yong Channel CC: Right thumb pain  IOE:[id number] GUNNISON [name] is a 63 y.o. male coming in with complaint of right thumb pain. Patient states that overall he continues to have significant pain in his thumb. Patient states his pain is mostly over the [facility] joint as well as the metacarpal phalangeal joint. The patient states it is very painful for any type of movement. States he is noticing weakness associated with it as well and from time to time if he can get stuck in certain positions. Patient states that the pain is 8/10 in severity. Denies any redness of the joint, denies any swelling. Stitches this is a severe pain they can keep him up at night. Patient was sent to do for medical supply for splint but unfortunately was unable to obtain.     Past medical history, social, surgical and family history all reviewed in electronic medical record.   Review of Systems: No headache, visual changes, nausea, vomiting, diarrhea, constipation, dizziness, abdominal pain, skin rash, fevers, chills, night sweats, weight loss, swollen lymph nodes, body aches, joint swelling, muscle aches, chest pain, shortness of breath, mood changes.   Objective Blood pressure 120/70, pulse 86, height 5' 10.5" (1.791 m), weight 233 lb (105.688 kg), SpO2 96.00%.  General: No apparent distress alert and oriented x3 mood and affect normal, dressed appropriately.  HEENT: Pupils equal, extraocular movements intact  Respiratory: Patient's speak in full sentences and does not appear short of breath  Cardiovascular: No lower extremity edema, non tender, no erythema  Skin: Warm dry intact with no signs of infection or rash on extremities or on axial skeleton.  Abdomen: Soft nontender  Neuro: Cranial nerves II through XII are intact, neurovascularly intact in all  extremities with 2+ DTRs and 2+ pulses.  Lymph: No lymphadenopathy of posterior or anterior cervical chain or axillae bilaterally.  Gait normal with good balance and coordination.  MSK:  Non tender with full range of motion and good stability and symmetric strength and tone of shoulders, elbows, wrist, hip, knee and ankles bilaterally.  Hand exam shows that patient does have severe tenderness over the MCP joint as well as the [facility] joint. Patient does have a triggering of the A2 pulley on the MCP joint. Neurovascularly intact distally.  Limited muscular skeletal ultrasound was performed and interpreted by Hulan Saas, M  Limited ultrasound shows the patient does have a trigger nodule noted at the MCP joint. Patient also notes a moderate to severe osteophytic changes of the [facility] joint and a trigger nodule also noted at the [facility] joint.  Procedure  After verbal consent patient was prepped with alcohol swabs and with a 25-gauge 1 inch needle patient was given injection over the trigger nodule with 0.5 cc of 40 mg/dL of Kenalog.. Patient tolerated the procedure well. Postinjection instructions given.  Impression: Trigger nodule tendon sheath injection successfully done.   Impression and Recommendations:     This case required medical decision making of moderate complexity.

## 2014-01-24 NOTE — Assessment & Plan Note (Signed)
Patient was given injection today and was put in a thumb spica removable brace that he will wear daily and nitroglycerin one week and nightly for one week. Patient was given home exercise program that I think will be beneficial as well. Patient was given a trial topical anti-inflammatories. Patient does have kidney disease but is already on dialysis, history of rectal bleeding but states that this was secondary to herniated. Patient also has a history of transient ischemic attack with coronary artery disease the patient states no other over-the-counter medications have been beneficial and likely topical is more safe and oral anti-inflammatories. Patient will try this as well as vitamin D supplementation. Patient and will come back and see me again in 2-3 weeks for further evaluation and treatment.

## 2014-01-24 NOTE — Patient Instructions (Signed)
It is an honor to meet you.  Ice 20 minutes 2 times a day can help Wear brace day and night for the next week then nightly for a week.  Exercises daily in the handout.  Try the pennsaid up to 2 times daily.  Stop it if it hurts your stomach.  Vitamin D 2000 IU daily Turmeric 500mg  twice daily Tylenol 500mg  3 times daily.  Come back again in 2 weeks to make sure you are better.

## 2014-02-12 ENCOUNTER — Ambulatory Visit: Payer: Medicare Other | Admitting: Family Medicine

## 2014-02-12 DIAGNOSIS — Z0289 Encounter for other administrative examinations: Secondary | ICD-10-CM

## 2014-02-19 ENCOUNTER — Encounter: Payer: Self-pay | Admitting: Family Medicine

## 2014-02-19 ENCOUNTER — Ambulatory Visit (INDEPENDENT_AMBULATORY_CARE_PROVIDER_SITE_OTHER): Payer: Medicare Other | Admitting: Family Medicine

## 2014-02-19 ENCOUNTER — Ambulatory Visit (INDEPENDENT_AMBULATORY_CARE_PROVIDER_SITE_OTHER)
Admission: RE | Admit: 2014-02-19 | Discharge: 2014-02-19 | Disposition: A | Discharge status: Home / Self Care | Payer: Medicare Other | Source: Ambulatory Visit | Attending: Family Medicine | Admitting: Family Medicine

## 2014-02-19 ENCOUNTER — Other Ambulatory Visit (INDEPENDENT_AMBULATORY_CARE_PROVIDER_SITE_OTHER): Payer: Medicare Other

## 2014-02-19 VITALS — BP 132/72 | HR 78 | Ht 70.5 in | Wt 241.0 lb

## 2014-02-19 DIAGNOSIS — M65311 Trigger thumb, right thumb: Secondary | ICD-10-CM

## 2014-02-19 DIAGNOSIS — M19049 Primary osteoarthritis, unspecified hand: Secondary | ICD-10-CM

## 2014-02-19 DIAGNOSIS — M6530 Trigger finger, unspecified finger: Secondary | ICD-10-CM

## 2014-02-19 DIAGNOSIS — M19041 Primary osteoarthritis, right hand: Secondary | ICD-10-CM

## 2014-02-19 NOTE — Assessment & Plan Note (Signed)
Patient does have significant other comorbidities. We did do another injection today but this time in the MCP joint. Patient tolerated the procedure well with more resolution pain than he had previously with the trigger finger injection. We discussed an icing regimen as well as wearing the brace today and that the next 3 days then nightly for 2 weeks. We will get formal physical therapy involved as well. Patient was referred today. Patient can try some topical anti-inflammatory and we discussed not using on a regular basis secondary to his other comorbidities. Patient will try these interventions and come see me again in 3 weeks.  Spent greater than 25 minutes with patient face-to-face and had greater than 50% of counseling including as described above in assessment and plan.

## 2014-02-19 NOTE — Progress Notes (Signed)
  [facility] [place] [address], [place] [address] Phone: [phone] Subjective:    I'm seeing this patient by the request  of:  Dr. Yong Channel CC: Right thumb pain  BTD:[id number] [name] is a 63 y.o. male coming in with complaint of right thumb pain. Patient states that overall he continues to have significant pain in his thumb. Patient states his pain is mostly over the [facility] joint as well as the metacarpal phalangeal joint. Patient was then have a trigger finger. Patient was given an injection and states that he did make some improvement. Patient unfortunately continued to have difficulties. Patient states that he is noticing some mild weakness of this thumbnail. Patient continues to wear the brace and has been doing some exercises but finds it difficult to be motivated to do it on a regular basis. Patient has not been doing the icing. Still taking tramadol fairly regularly for pain relief.   X-rays were ordered and interpreted by me today. X-rays show no significant bony abnormality.    Past medical history, social, surgical and family history all reviewed in electronic medical record.   Review of Systems: No headache, visual changes, nausea, vomiting, diarrhea, constipation, dizziness, abdominal pain, skin rash, fevers, chills, night sweats, weight loss, swollen lymph nodes, body aches, joint swelling, muscle aches, chest pain, shortness of breath, mood changes.   Objective Blood pressure 132/72, pulse 78, weight 241 lb (109.317 kg), SpO2 97.00%.  General: No apparent distress alert and oriented x3 mood and affect normal, dressed appropriately.  HEENT: Pupils equal, extraocular movements intact  Respiratory: Patient's speak in full sentences and does not appear short of breath  Cardiovascular: No lower extremity edema, non tender, no erythema  Skin: Warm dry intact with no signs of infection or rash on extremities or on axial skeleton.  Abdomen: Soft  nontender  Neuro: Cranial nerves II through XII are intact, neurovascularly intact in all extremities with 2+ DTRs and 2+ pulses.  Lymph: No lymphadenopathy of posterior or anterior cervical chain or axillae bilaterally.  Gait normal with good balance and coordination.  MSK:  Non tender with full range of motion and good stability and symmetric strength and tone of shoulders, elbows, wrist, hip, knee and ankles bilaterally.  Hand exam shows that patient does have severe tenderness over the MCP joint as well as the [facility] joint. Patient does have a triggering of the A2 pulley on the MCP joint. More pain over the MCP joint today. Limited muscular skeletal ultrasound was performed and interpreted by Hulan Saas, M  Limited ultrasound shows the patient does have a trigger nodule noted at the MCP joint and severe arthritis.   Procedure  After verbal consent patient was prepped with alcohol swabs and with a 25-gauge 1 inch needle patient was given injection over the trigger nodule with 0.5 cc of 40 mg/dL of Kenalog.. Patient tolerated the procedure well. Postinjection instructions given.  Impression: MP of right thumb injection.     Impression and Recommendations:     This case required medical decision making of moderate complexity.

## 2014-02-19 NOTE — Patient Instructions (Signed)
Good to see you I am sorry you are still having pain.  We tried an injection in the joint this time Get xrays downstairs.  Ice is your friend Brace day and night for next 3 days then nightly Physical therapy will be calling you.  See me again in 3 weeks.

## 2014-02-21 ENCOUNTER — Other Ambulatory Visit: Payer: Self-pay | Admitting: Neurology

## 2014-02-22 LAB — HEMOGLOBIN A1C: Hgb A1c MFr Bld: 6.7 % — AB (ref 4.0–6.0)

## 2014-03-09 ENCOUNTER — Ambulatory Visit: Payer: Medicare Other | Attending: Internal Medicine | Admitting: Occupational Therapy

## 2014-03-26 LAB — BASIC METABOLIC PANEL
BUN: 66 mg/dL — AB (ref 4–21)
SODIUM: 136 mmol/L — AB (ref 137–147)

## 2014-03-26 LAB — HEPATIC FUNCTION PANEL
ALT: 13 U/L (ref 10–40)
AST: 8 U/L — AB (ref 14–40)

## 2014-05-16 ENCOUNTER — Encounter: Payer: Self-pay | Admitting: Family Medicine

## 2014-05-16 ENCOUNTER — Ambulatory Visit (INDEPENDENT_AMBULATORY_CARE_PROVIDER_SITE_OTHER): Payer: Medicare Other | Admitting: Family Medicine

## 2014-05-16 VITALS — BP 120/74 | HR 84 | Temp 99.0°F | Wt 238.0 lb

## 2014-05-16 DIAGNOSIS — G4700 Insomnia, unspecified: Secondary | ICD-10-CM

## 2014-05-16 DIAGNOSIS — F4310 Post-traumatic stress disorder, unspecified: Secondary | ICD-10-CM

## 2014-05-16 DIAGNOSIS — G4733 Obstructive sleep apnea (adult) (pediatric): Secondary | ICD-10-CM

## 2014-05-16 DIAGNOSIS — I10 Essential (primary) hypertension: Secondary | ICD-10-CM

## 2014-05-16 DIAGNOSIS — K219 Gastro-esophageal reflux disease without esophagitis: Secondary | ICD-10-CM | POA: Insufficient documentation

## 2014-05-16 MED ORDER — DIAZEPAM 5 MG PO TABS: 5.0000 mg | ORAL_TABLET | Freq: Every day | ORAL | Status: DC

## 2014-05-16 NOTE — Assessment & Plan Note (Signed)
Well controlled on metoprolol 12.5mg  BID

## 2014-05-16 NOTE — Patient Instructions (Signed)
Refilled valium, blood pressure looks great, sending you for evaluation for CPAP.

## 2014-05-16 NOTE — Assessment & Plan Note (Signed)
We will refer to sleep medicine at this time. Hopeful this issue will be able to get resolved more quickly through this process rather through [place] and he has checked with [facility] and was told it would be covered.

## 2014-05-16 NOTE — Assessment & Plan Note (Signed)
Treated with valium as needed for sleep. Daytime issues are minimal. Refilled valium at this time.

## 2014-05-16 NOTE — Progress Notes (Signed)
Garret Reddish, MD Phone: [phone]  Subjective:  Patient presents today to establish care with me as their new primary care provider. Patient was formerly a patient of Dr. Arnoldo Morale. Chief complaint-noted.   Hypertension-controlled  BP Readings from Last 3 Encounters:  05/16/14 120/74  02/19/14 132/72  01/24/14 120/70  Home BP monitoring-no Compliant with medications-yes without side effects ROS-Denies any CP, HA, SOB, blurry vision, LE edema (occasionally on day before dialysis will have)  OSA- poor control Diagnosed previously by the [place] but was told he would need repeat evaluation before placing him on CPAP. He would like to do this outside the [place] if possible .  ROS-Snores nightly and daytime sleepiness noted.   PTSD/insomnia- stable Daytime issues are minimal unless he sees something on tv. Main trouble is nightmares. Without valium he is unable to sleep and stays up at night.  ROS- no SI/HI  The following were reviewed and entered/updated in epic: Past Medical History  Diagnosis Date  . Coronary atherosclerosis of native coronary artery   . Other malaise and fatigue   . Chest pain, unspecified   . Other dyspnea and respiratory abnormality   . Personal history of unspecified circulatory disease   . Dysphagia, unspecified(787.20)   . Internal hemorrhoids without mention of complication   . Hypertrophy of prostate with urinary obstruction and other lower urinary tract symptoms (LUTS)   . Nausea alone   . Gastroparesis   . Backache, unspecified   . Posttraumatic stress disorder   . Hemorrhage of rectum and anus   . Allergy   . Diabetes mellitus 2004  . CHF (congestive heart failure)   . Hyperlipidemia   . Arthritis   . Dizziness   . Headache(784.0)   . Obesity   . Chills   . Complication of anesthesia     01/2011 could not eat,, hospt x2, was placed on hd and cleared up  . Myocardial infarction 2002  . Stroke     2007  . Unspecified essential hypertension    hx htn   . Chronic kidney disease, stage III (moderate)     HD T- TH-SAT  . Sleep apnea     USES CPAP   . Angina     LAST HAD CP 2 MO AGO  DR WALL [place]  . INTERNAL HEMORRHOIDS 10/16/2008    Qualifier: Diagnosis of  By: Nolon Rod CMA Deborra Medina), Robin     Patient Active Problem List   Diagnosis Date Noted  . CAD, NATIVE VESSEL 04/09/2009    Priority: High  . ESRD on dialysis 02/27/2009    Priority: High  . Poorly controlled diabetes mellitus 09/05/2007    Priority: High  . Gout 12/22/2009    Priority: Medium  . Obstructive sleep apnea 07/10/2009    Priority: Medium  . BPH (benign prostatic hyperplasia) 09/25/2008    Priority: Medium  . Low back pain 12/15/2007    Priority: Medium  . Posttraumatic stress disorder 11/03/2007    Priority: Medium  . Essential hypertension 01/17/2007    Priority: Medium  . CVA (cerebral infarction) 01/17/2007    Priority: Medium  . GERD (gastroesophageal reflux disease) 05/16/2014    Priority: Low  . Left foot drop 05/02/2013    Priority: Low  . Risk for falls 01/22/2012    Priority: Low  . Lower extremity neuropathy 10/14/2010    Priority: Low  . Gastroparesis 06/19/2008    Priority: Low  . Allergic rhinitis 03/01/2007    Priority: Low  . Trigger thumb  of right hand 01/03/2014   Past Surgical History  Procedure Laterality Date  . Hemorrhoid surgery    . Anal fissurectomy    . Av fistula placement    . Revision of fistula      renal failure  . Cardiac catheterization      2005 DR BRODIE  . Ventral hernia repair  07/01/2011    Procedure: HERNIA REPAIR VENTRAL ADULT;  Surgeon: Joyice Faster. Cornett, MD;  Location: [facility];  Service: General;  Laterality: N/A;    Family History  Problem Relation Age of Onset  . Diabetes Sister   . Heart disease Sister   . Diabetes Maternal Aunt   . Diabetes Maternal Uncle   . Diabetes Paternal Aunt   . Diabetes Paternal Uncle   . Heart disease    . Heart disease Father   . Renal Disease Father   .  Heart disease Brother   . Dementia Mother     Medications- reviewed and updated Current Outpatient Prescriptions  Medication Sig Dispense Refill  . ACCU-CHEK SOFTCLIX LANCETS lancets Use three times daily 200 each 3  . allopurinol (ZYLOPRIM) 100 MG tablet Take 100 mg by mouth 2 (two) times daily.     [name] aspirin 81 MG tablet Take 81 mg by mouth daily.      . Blood Glucose Monitoring Suppl (ACCU-CHEK AVIVA) device Use as instructed 1 each 0  . calcium acetate (PHOSLO) 667 MG capsule Take 667 mg by mouth 3 (three) times daily with meals.    . clopidogrel (PLAVIX) 75 MG tablet Take 75 mg by mouth at bedtime.     . gabapentin (NEURONTIN) 300 MG capsule Take 300 mg by mouth 2 (two) times daily.    [name] glucose blood (ACCU-CHEK AVIVA PLUS) test strip Test three times daily 200 each 3  . insulin glargine (LANTUS) 100 UNIT/ML injection Inject 90 Units into the skin at bedtime.      . insulin lispro protamine-lispro (HUMALOG 75/25 MIX) (75-25) 100 UNIT/ML SUSP injection Inject 50 Units into the skin 2 (two) times daily with a meal. 30 mL 12  . metoprolol tartrate (LOPRESSOR) 25 MG tablet Take 12.5 mg by mouth daily.    . multivitamin (RENA-VIT) TABS tablet Take 1 tablet by mouth every morning.     [name] omeprazole (PRILOSEC) 20 MG capsule Take 20 mg by mouth daily.    . solifenacin (VESICARE) 5 MG tablet Take 5 mg by mouth every morning.     . Tamsulosin HCl (FLOMAX) 0.4 MG CAPS Take 0.4 mg by mouth every morning.     . topiramate (TOPAMAX) 100 MG tablet TAKE 1 TABLET (100 MG TOTAL) BY MOUTH 2 (TWO) TIMES DAILY. 180 tablet 0  . diazepam (VALIUM) 5 MG tablet Take 5 mg by mouth at bedtime.     . traMADol (ULTRAM) 50 MG tablet Take 1 tablet (50 mg total) by mouth every 8 (eight) hours as needed for pain. For pain. Take everyday per patient (Patient not taking: Reported on 05/16/2014) 15 tablet 0   Allergies-reviewed and updated No Known Allergies  History   Social History  . Marital Status: Married     Spouse Name: N/A    Number of Children: N/A  . Years of Education: N/A   Occupational History  . disabled    Social History Main Topics  . Smoking status: Never Smoker   . Smokeless tobacco: Never Used  . Alcohol Use: No  . Drug Use: No  . Sexual  Activity: Not Currently   Other Topics Concern  . Not on file   Social History Narrative   Married 1998 but in process of divorce. 97 year old son in 3. 1 granddaughter from [place].       Retired from [employer] 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  CV: RRR no murmurs rubs or gallops Lungs: CTAB no crackles, wheeze, rhonchi Abdomen: soft/nontender/nondistended/normal bowel sounds.  Ext: no edema, AVF left arm Skin: warm, dry Neuro: grossly normal, moves all extremities, some short term memory issues, 4/5 strength right arm   Assessment/Plan:  Essential hypertension Well controlled on metoprolol 12.5mg  BID  Obstructive sleep apnea We will refer to sleep medicine at this time. Hopeful this issue will be able to get resolved more quickly through this process rather through [place] and he has checked with [facility] and was told it would be covered.   Posttraumatic stress disorder Insomnia Treated with valium as needed for sleep. Daytime issues are minimal. Refilled valium at this time.   Orders Placed This Encounter  Procedures  . Ambulatory referral to Sleep Studies    Referral Priority:  Routine    Referral Type:  Consultation    Referral Reason:  Specialty Services Required    Number of Visits Requested:  1   Meds ordered this encounter  Medications  . diazepam (VALIUM) 5 MG tablet    Sig: Take 1 tablet (5 mg total) by mouth at bedtime.     Dispense:  30 tablet    Refill:  5

## 2014-05-25 ENCOUNTER — Other Ambulatory Visit: Payer: Self-pay | Admitting: Neurology

## 2014-06-25 ENCOUNTER — Telehealth: Payer: Self-pay | Admitting: Family Medicine

## 2014-06-25 MED ORDER — GABAPENTIN 300 MG PO CAPS: 300.0000 mg | ORAL_CAPSULE | Freq: Two times a day (BID) | ORAL | Status: DC

## 2014-06-25 NOTE — Telephone Encounter (Signed)
Pt [facility] and will take 10 days to 2 weeks for him to get  Pt would like to know if we can send in 2 wks supply  To [facility] [address]  To get him through to he gets his meds

## 2014-06-26 ENCOUNTER — Institutional Professional Consult (permissible substitution): Payer: Medicare Other | Admitting: Pulmonary Disease

## 2014-06-28 ENCOUNTER — Other Ambulatory Visit: Payer: Self-pay | Admitting: Neurology

## 2014-06-28 NOTE — Telephone Encounter (Signed)
Patient has not been seen in over 1 year.  I called, got no answer.  Left message.

## 2014-07-19 LAB — CBC AND DIFFERENTIAL
HCT: 36 % — AB (ref 41–53)
HEMOGLOBIN: 12.1 g/dL — AB (ref 13.5–17.5)
WBC: 6.5 10^3/mL

## 2014-07-19 LAB — BASIC METABOLIC PANEL
CREATININE: 10.8 mg/dL — AB (ref <=1.3)
Creatinine: 10.8 mg/dL — AB (ref <=1.3)
Glucose: 126 mg/dL
Glucose: 126 mg/dL
Potassium: 4.8 mmol/L (ref 3.4–5.3)
Potassium: 4.8 mmol/L (ref 3.4–5.3)
SODIUM: 143 mmol/L (ref 137–147)
Sodium: 5 mmol/L — AB (ref 137–147)

## 2014-07-25 ENCOUNTER — Encounter: Payer: Self-pay | Admitting: Family Medicine

## 2014-07-25 ENCOUNTER — Other Ambulatory Visit: Payer: Self-pay

## 2014-07-25 ENCOUNTER — Telehealth: Payer: Self-pay | Admitting: Family Medicine

## 2014-07-25 ENCOUNTER — Ambulatory Visit (INDEPENDENT_AMBULATORY_CARE_PROVIDER_SITE_OTHER): Payer: Medicare Other | Admitting: Family Medicine

## 2014-07-25 VITALS — BP 100/80 | HR 70 | Temp 98.2°F | Wt 238.0 lb

## 2014-07-25 DIAGNOSIS — E1165 Type 2 diabetes mellitus with hyperglycemia: Secondary | ICD-10-CM

## 2014-07-25 DIAGNOSIS — G5792 Unspecified mononeuropathy of left lower limb: Secondary | ICD-10-CM | POA: Diagnosis not present

## 2014-07-25 DIAGNOSIS — G5791 Unspecified mononeuropathy of right lower limb: Secondary | ICD-10-CM | POA: Diagnosis not present

## 2014-07-25 DIAGNOSIS — G5793 Unspecified mononeuropathy of bilateral lower limbs: Secondary | ICD-10-CM

## 2014-07-25 MED ORDER — TRAMADOL HCL 50 MG PO TABS: 50.0000 mg | ORAL_TABLET | Freq: Three times a day (TID) | ORAL | Status: DC | PRN

## 2014-07-25 MED ORDER — TOPIRAMATE 100 MG PO TABS: ORAL_TABLET | ORAL | Status: DC

## 2014-07-25 NOTE — Telephone Encounter (Signed)
Dr. Yong Channel is aware of endocrinologist as well as it was updated in Care Teams.

## 2014-07-25 NOTE — Telephone Encounter (Signed)
FYI Pt is calling to let dr hunter know his endocrinologist is dr Jeanann Lewandowsky. Pt will come in and sign medical release form to have records sent to dr hunter

## 2014-07-25 NOTE — Assessment & Plan Note (Signed)
Abstracted last a1c in. Continued neuropathic pain with reasonable control on topamax, gabapentin, and tramadol under care of [facility]. Plan to complete foot exam at next visit.

## 2014-07-25 NOTE — Progress Notes (Signed)
Garret Reddish, MD Phone: [phone]  Subjective:   [name] is a 64 y.o. year old very pleasant male patient who presents with the following:  Neuropathy stable Stable on topamax for years also has tramadol for as needed pain and requests refill until can see neurology next month  Diabetes mellitus controlled Patient sees endocrine but unclear of name. Thought it was [facility] but no records. Does have a1c report from October but has seen endocrine since that time.   ROS- denies worsening leg weakness. no fecal incontinence  Past Medical History- Patient Active Problem List   Diagnosis Date Noted  . CAD, NATIVE VESSEL 04/09/2009    Priority: High  . ESRD on dialysis 02/27/2009    Priority: High  . Poorly controlled diabetes mellitus 09/05/2007    Priority: High  . Gout 12/22/2009    Priority: Medium  . Obstructive sleep apnea 07/10/2009    Priority: Medium  . BPH (benign prostatic hyperplasia) 09/25/2008    Priority: Medium  . Low back pain 12/15/2007    Priority: Medium  . Posttraumatic stress disorder 11/03/2007    Priority: Medium  . Essential hypertension 01/17/2007    Priority: Medium  . CVA (cerebral infarction) 01/17/2007    Priority: Medium  . GERD (gastroesophageal reflux disease) 05/16/2014    Priority: Low  . Trigger thumb of right hand 01/03/2014    Priority: Low  . Left foot drop 05/02/2013    Priority: Low  . Risk for falls 01/22/2012    Priority: Low  . Lower extremity neuropathy 10/14/2010    Priority: Low  . Gastroparesis 06/19/2008    Priority: Low  . Allergic rhinitis 03/01/2007    Priority: Low   Medications- reviewed and updated Current Outpatient Prescriptions  Medication Sig Dispense Refill  . ACCU-CHEK SOFTCLIX LANCETS lancets Use three times daily 200 each 3  . allopurinol (ZYLOPRIM) 100 MG tablet Take 100 mg by mouth 2 (two) times daily.     [name] aspirin 81 MG tablet Take 81 mg by mouth daily.      . Blood Glucose Monitoring  Suppl (ACCU-CHEK AVIVA) device Use as instructed 1 each 0  . calcium acetate (PHOSLO) 667 MG capsule Take 667 mg by mouth 3 (three) times daily with meals.    . clopidogrel (PLAVIX) 75 MG tablet Take 75 mg by mouth at bedtime.     . diazepam (VALIUM) 5 MG tablet Take 1 tablet (5 mg total) by mouth at bedtime. 30 tablet 5  . gabapentin (NEURONTIN) 300 MG capsule Take 1 capsule (300 mg total) by mouth 2 (two) times daily. 28 capsule 0  . glucose blood (ACCU-CHEK AVIVA PLUS) test strip Test three times daily 200 each 3  . insulin glargine (LANTUS) 100 UNIT/ML injection Inject 90 Units into the skin at bedtime.      . insulin lispro protamine-lispro (HUMALOG 75/25 MIX) (75-25) 100 UNIT/ML SUSP injection Inject 50 Units into the skin 2 (two) times daily with a meal. 30 mL 12  . metoprolol tartrate (LOPRESSOR) 25 MG tablet Take 12.5 mg by mouth daily.    . multivitamin (RENA-VIT) TABS tablet Take 1 tablet by mouth every morning.     [name] omeprazole (PRILOSEC) 20 MG capsule Take 20 mg by mouth daily.    . solifenacin (VESICARE) 5 MG tablet Take 5 mg by mouth every morning.     . Tamsulosin HCl (FLOMAX) 0.4 MG CAPS Take 0.4 mg by mouth every morning.     . topiramate (TOPAMAX)  100 MG tablet TAKE 1 TABLET (100 MG TOTAL) BY MOUTH 2 (TWO) TIMES DAILY. 60 tablet 1  . traMADol (ULTRAM) 50 MG tablet Take 1 tablet (50 mg total) by mouth every 8 (eight) hours as needed. For pain. Take everyday per patient 30 tablet 1   No current facility-administered medications for this visit.    Objective: BP 100/80 mmHg  Pulse 70  Temp(Src) 98.2 F (36.8 C) (Oral)  Wt 238 lb (107.956 kg)  SpO2 97% Gen: NAD, resting comfortably, appears older than stated age CV: RRR no murmurs rubs or gallops Lungs: CTAB no crackles, wheeze, rhonchi Abdomen: soft/nontender/nondistended/normal bowel sounds.  Ext: no edema Skin: warm, dry, no rash   Assessment/Plan:  Lower extremity neuropathy Refill x 2 months tramadol and topamax  to allow patient to get into the [place] for neurology follow up.    Poorly controlled diabetes mellitus Abstracted last a1c in. Continued neuropathic pain with reasonable control on topamax, gabapentin, and tramadol under care of [facility]y. Plan to complete foot exam at next visit.    Follow up within a month to update. Lars Mage is looking into reported colonoscopy at [facility] which I cannot find. Received Td and zostavax at [facility] so will have them fax to [place].  Health Maintenance Due  Topic Date Due  . HIV Screening  11/[birth year]/1963  . TETANUS/TDAP  11/23/1967  . COLONOSCOPY  11/23/1998  . ZOSTAVAX  11/22/2008  . FOOT EXAM  03/27/2010   Meds ordered this encounter  Medications  . topiramate (TOPAMAX) 100 MG tablet    Sig: TAKE 1 TABLET (100 MG TOTAL) BY MOUTH 2 (TWO) TIMES DAILY.    Dispense:  60 tablet    Refill:  1    Please Schedule Appt.  Thank you.  . traMADol (ULTRAM) 50 MG tablet    Sig: Take 1 tablet (50 mg total) by mouth every 8 (eight) hours as needed. For pain. Take everyday per patient    Dispense:  30 tablet    Refill:  1

## 2014-07-25 NOTE — Progress Notes (Signed)
Pre visit review using our clinic review tool, if applicable. No additional management support is needed unless otherwise documented below in the visit note. 

## 2014-07-25 NOTE — Patient Instructions (Signed)
Refilled topamax and tramadol until you can get back into neurology  Encourage you to call for sleep evaluation  Try to get copy of colonoscopy  Try to figure out who your endocrinologist is  See me within a month or two and we will continue to update the following. Ok to bring your osn to visit so we can meet.   Health Maintenance Due  Topic Date Due  . HIV Screening  11/23/1963  . TETANUS/TDAP  11/23/1967  . COLONOSCOPY  11/23/1998  . ZOSTAVAX  11/22/2008  . FOOT EXAM  03/27/2010  . HEMOGLOBIN A1C  07/01/2011

## 2014-07-25 NOTE — Assessment & Plan Note (Signed)
Refill x 2 months tramadol and topamax to allow patient to get into the [place] for neurology follow up.

## 2014-07-26 ENCOUNTER — Encounter: Payer: Self-pay | Admitting: Family Medicine

## 2014-07-27 DIAGNOSIS — R972 Elevated prostate specific antigen [PSA]: Secondary | ICD-10-CM | POA: Diagnosis not present

## 2014-08-02 ENCOUNTER — Encounter: Payer: Self-pay | Admitting: Family Medicine

## 2014-08-03 DIAGNOSIS — N138 Other obstructive and reflux uropathy: Secondary | ICD-10-CM | POA: Diagnosis not present

## 2014-08-03 DIAGNOSIS — N2889 Other specified disorders of kidney and ureter: Secondary | ICD-10-CM | POA: Diagnosis not present

## 2014-08-03 DIAGNOSIS — R972 Elevated prostate specific antigen [PSA]: Secondary | ICD-10-CM | POA: Diagnosis not present

## 2014-08-03 DIAGNOSIS — R3912 Poor urinary stream: Secondary | ICD-10-CM | POA: Diagnosis not present

## 2014-08-09 ENCOUNTER — Encounter: Payer: Self-pay | Admitting: Nephrology

## 2014-08-22 ENCOUNTER — Other Ambulatory Visit: Payer: Self-pay | Admitting: *Deleted

## 2014-08-22 MED ORDER — TOPIRAMATE 100 MG PO TABS: ORAL_TABLET | ORAL | Status: DC

## 2014-10-01 DIAGNOSIS — K0262 Dental caries on smooth surface penetrating into dentin: Secondary | ICD-10-CM | POA: Diagnosis not present

## 2014-10-10 DIAGNOSIS — H4322 Crystalline deposits in vitreous body, left eye: Secondary | ICD-10-CM | POA: Diagnosis not present

## 2014-10-10 DIAGNOSIS — H4321 Crystalline deposits in vitreous body, right eye: Secondary | ICD-10-CM | POA: Diagnosis not present

## 2014-10-10 DIAGNOSIS — H524 Presbyopia: Secondary | ICD-10-CM | POA: Diagnosis not present

## 2014-10-10 LAB — HM DIABETES EYE EXAM

## 2014-10-16 ENCOUNTER — Encounter: Payer: Self-pay | Admitting: Family Medicine

## 2014-10-23 ENCOUNTER — Emergency Department (HOSPITAL_COMMUNITY): Payer: Medicare Other

## 2014-10-23 ENCOUNTER — Observation Stay (HOSPITAL_COMMUNITY)
Admission: EM | Admit: 2014-10-23 | Discharge: 2014-10-23 | Disposition: A | Discharge status: Home / Self Care | Payer: Medicare Other | Attending: Internal Medicine | Admitting: Internal Medicine

## 2014-10-23 DIAGNOSIS — N186 End stage renal disease: Secondary | ICD-10-CM | POA: Diagnosis not present

## 2014-10-23 DIAGNOSIS — R079 Chest pain, unspecified: Secondary | ICD-10-CM | POA: Diagnosis present

## 2014-10-23 DIAGNOSIS — I120 Hypertensive chronic kidney disease with stage 5 chronic kidney disease or end stage renal disease: Secondary | ICD-10-CM | POA: Insufficient documentation

## 2014-10-23 DIAGNOSIS — R0789 Other chest pain: Secondary | ICD-10-CM | POA: Diagnosis not present

## 2014-10-23 DIAGNOSIS — I712 Thoracic aortic aneurysm, without rupture, unspecified: Secondary | ICD-10-CM | POA: Diagnosis present

## 2014-10-23 DIAGNOSIS — Z9989 Dependence on other enabling machines and devices: Secondary | ICD-10-CM | POA: Diagnosis not present

## 2014-10-23 DIAGNOSIS — I77810 Thoracic aortic ectasia: Secondary | ICD-10-CM | POA: Diagnosis present

## 2014-10-23 DIAGNOSIS — G4733 Obstructive sleep apnea (adult) (pediatric): Secondary | ICD-10-CM | POA: Diagnosis not present

## 2014-10-23 DIAGNOSIS — I953 Hypotension of hemodialysis: Secondary | ICD-10-CM | POA: Insufficient documentation

## 2014-10-23 DIAGNOSIS — F4310 Post-traumatic stress disorder, unspecified: Secondary | ICD-10-CM | POA: Diagnosis not present

## 2014-10-23 DIAGNOSIS — R252 Cramp and spasm: Secondary | ICD-10-CM | POA: Insufficient documentation

## 2014-10-23 DIAGNOSIS — Z7982 Long term (current) use of aspirin: Secondary | ICD-10-CM | POA: Diagnosis not present

## 2014-10-23 DIAGNOSIS — Z992 Dependence on renal dialysis: Secondary | ICD-10-CM | POA: Diagnosis not present

## 2014-10-23 DIAGNOSIS — I2510 Atherosclerotic heart disease of native coronary artery without angina pectoris: Secondary | ICD-10-CM | POA: Insufficient documentation

## 2014-10-23 DIAGNOSIS — E785 Hyperlipidemia, unspecified: Secondary | ICD-10-CM | POA: Diagnosis not present

## 2014-10-23 DIAGNOSIS — E119 Type 2 diabetes mellitus without complications: Secondary | ICD-10-CM | POA: Diagnosis not present

## 2014-10-23 DIAGNOSIS — E0843 Diabetes mellitus due to underlying condition with diabetic autonomic (poly)neuropathy: Secondary | ICD-10-CM

## 2014-10-23 DIAGNOSIS — I252 Old myocardial infarction: Secondary | ICD-10-CM | POA: Diagnosis not present

## 2014-10-23 DIAGNOSIS — I10 Essential (primary) hypertension: Secondary | ICD-10-CM | POA: Diagnosis present

## 2014-10-23 DIAGNOSIS — R072 Precordial pain: Secondary | ICD-10-CM

## 2014-10-23 DIAGNOSIS — Z794 Long term (current) use of insulin: Secondary | ICD-10-CM | POA: Insufficient documentation

## 2014-10-23 DIAGNOSIS — Z8673 Personal history of transient ischemic attack (TIA), and cerebral infarction without residual deficits: Secondary | ICD-10-CM | POA: Diagnosis not present

## 2014-10-23 DIAGNOSIS — J9811 Atelectasis: Secondary | ICD-10-CM | POA: Diagnosis not present

## 2014-10-23 DIAGNOSIS — Z955 Presence of coronary angioplasty implant and graft: Secondary | ICD-10-CM | POA: Diagnosis not present

## 2014-10-23 DIAGNOSIS — E1140 Type 2 diabetes mellitus with diabetic neuropathy, unspecified: Secondary | ICD-10-CM | POA: Diagnosis present

## 2014-10-23 DIAGNOSIS — I509 Heart failure, unspecified: Secondary | ICD-10-CM | POA: Insufficient documentation

## 2014-10-23 LAB — CBC WITH DIFFERENTIAL/PLATELET
BASOS ABS: 0 10*3/uL (ref 0.0–0.1)
Basophils Relative: 0 % (ref 0–1)
EOS PCT: 2 % (ref 0–5)
Eosinophils Absolute: 0.1 10*3/uL (ref 0.0–0.7)
HCT: 39.8 % (ref 39.0–52.0)
Hemoglobin: 13 g/dL (ref 13.0–17.0)
LYMPHS PCT: 13 % (ref 12–46)
Lymphs Abs: 0.7 10*3/uL (ref 0.7–4.0)
MCH: 29.1 pg (ref 26.0–34.0)
MCHC: 32.7 g/dL (ref 30.0–36.0)
MCV: 89.2 fL (ref 78.0–100.0)
Monocytes Absolute: 0.4 10*3/uL (ref 0.1–1.0)
Monocytes Relative: 8 % (ref 3–12)
NEUTROS PCT: 77 % (ref 43–77)
Neutro Abs: 4.2 10*3/uL (ref 1.7–7.7)
Platelets: 220 10*3/uL (ref 150–400)
RBC: 4.46 MIL/uL (ref 4.22–5.81)
RDW: 15 % (ref 11.5–15.5)
WBC: 5.5 10*3/uL (ref 4.0–10.5)

## 2014-10-23 LAB — TROPONIN I
Troponin I: <0.03 ng/mL (ref <0.031)
Troponin I: <0.03 ng/mL (ref <0.031)

## 2014-10-23 LAB — BASIC METABOLIC PANEL
Anion gap: 16 — ABNORMAL HIGH (ref 5–15)
BUN: 28 mg/dL — ABNORMAL HIGH (ref 6–20)
CALCIUM: 7.9 mg/dL — AB (ref 8.9–10.3)
CHLORIDE: 88 mmol/L — AB (ref 101–111)
CO2: 30 mmol/L (ref 22–32)
CREATININE: 7.06 mg/dL — AB (ref 0.61–1.24)
GFR calc Af Amer: 8 mL/min — ABNORMAL LOW (ref >60)
GFR calc non Af Amer: 7 mL/min — ABNORMAL LOW (ref >60)
GLUCOSE: 112 mg/dL — AB (ref 65–99)
Potassium: 4.1 mmol/L (ref 3.5–5.1)
Sodium: 134 mmol/L — ABNORMAL LOW (ref 135–145)

## 2014-10-23 LAB — CBG MONITORING, ED: Glucose-Capillary: 187 mg/dL — ABNORMAL HIGH (ref 65–99)

## 2014-10-23 MED ORDER — INSULIN ASPART 100 UNIT/ML ~~LOC~~ SOLN
0.0000 [IU] | Freq: Three times a day (TID) | SUBCUTANEOUS | Status: DC
  Administered 2014-10-23: 3 [IU] via SUBCUTANEOUS
  Filled 2014-10-23: qty 1

## 2014-10-23 MED ORDER — TRAMADOL HCL 50 MG PO TABS
50.0000 mg | ORAL_TABLET | Freq: Four times a day (QID) | ORAL | Status: DC | PRN
  Administered 2014-10-23: 50 mg via ORAL
  Filled 2014-10-23: qty 1

## 2014-10-23 MED ORDER — TRAMADOL 5 MG/ML ORAL SUSPENSION: 50.0000 mg | Freq: Four times a day (QID) | ORAL | Status: DC | PRN

## 2014-10-23 MED ORDER — NITROGLYCERIN 2 % TD OINT
1.0000 [in_us] | TOPICAL_OINTMENT | Freq: Once | TRANSDERMAL | Status: AC
  Administered 2014-10-23: 1 [in_us] via TOPICAL
  Filled 2014-10-23: qty 1

## 2014-10-23 MED ORDER — INSULIN ASPART 100 UNIT/ML ~~LOC~~ SOLN: 0.0000 [IU] | Freq: Every day | SUBCUTANEOUS | Status: DC

## 2014-10-23 MED ORDER — NITROGLYCERIN 2 % TD OINT: 1.0000 [in_us] | TOPICAL_OINTMENT | Freq: Four times a day (QID) | TRANSDERMAL | Status: DC

## 2014-10-23 MED ORDER — SODIUM CHLORIDE 0.9 % IV SOLN
Freq: Once | INTRAVENOUS | Status: AC
  Administered 2014-10-23: 12:00:00 via INTRAVENOUS

## 2014-10-23 MED ORDER — NITROGLYCERIN 0.4 MG SL SUBL
0.4000 mg | SUBLINGUAL_TABLET | SUBLINGUAL | Status: DC | PRN
  Administered 2014-10-23 (×2): 0.4 mg via SUBLINGUAL
  Filled 2014-10-23: qty 1

## 2014-10-23 NOTE — ED Provider Notes (Signed)
CSN: [id number]     Arrival date & time 10/23/14  1038 History   First MD Initiated Contact with Patient 10/23/14 1048     Chief Complaint  Patient presents with  . Chest Pain  . Extremity Pain     (Consider location/radiation/quality/duration/timing/severity/associated sxs/prior Treatment) HPI complains of pain in chest, jaw, and bilateral legs, onset 9:42 AM. Crampy in nature. Associated symptoms include shortness of breath and nausea no vomiting no diaphoresis. Brought by EMS. Treated with aspirin en route. No other associated symptoms. . Pain is 8 on a scale of 1-10 presently. Nothing makes symptoms better or worse. Patient had taken his Plavix earlier today. Past Medical History  Diagnosis Date  . Coronary atherosclerosis of native coronary artery   . Other malaise and fatigue   . Chest pain, unspecified   . Other dyspnea and respiratory abnormality   . Personal history of unspecified circulatory disease   . Dysphagia, unspecified(787.20)   . Internal hemorrhoids without mention of complication   . Hypertrophy of prostate with urinary obstruction and other lower urinary tract symptoms (LUTS)   . Nausea alone   . Gastroparesis   . Backache, unspecified   . Posttraumatic stress disorder   . Hemorrhage of rectum and anus   . Allergy   . Diabetes mellitus 2004  . CHF (congestive heart failure)   . Hyperlipidemia   . Arthritis   . Dizziness   . Headache(784.0)   . Obesity   . Chills   . Complication of anesthesia     01/2011 could not eat,, hospt x2, was placed on hd and cleared up  . Myocardial infarction 2002  . Stroke     2007  . Unspecified essential hypertension     hx htn   . Chronic kidney disease, stage III (moderate)     HD T- TH-SAT  . Sleep apnea     USES CPAP   . Angina     LAST HAD CP 2 MO AGO  DR [facility] [place]  . INTERNAL HEMORRHOIDS 10/16/2008    Qualifier: Diagnosis of  By: Nolon Rod CMA Deborra Medina), Shirlean Mylar     Past Surgical History  Procedure Laterality  Date  . Hemorrhoid surgery    . Anal fissurectomy    . Av fistula placement    . Revision of fistula      renal failure  . Cardiac catheterization      2005 DR BRODIE  . Ventral hernia repair  07/01/2011    Procedure: HERNIA REPAIR VENTRAL ADULT;  Surgeon: Joyice Faster. Cornett, MD;  Location: [place] OR;  Service: General;  Laterality: N/A;   Family History  Problem Relation Age of Onset  . Diabetes Sister   . Heart disease Sister   . Diabetes Maternal Aunt   . Diabetes Maternal Uncle   . Diabetes Paternal Aunt   . Diabetes Paternal Uncle   . Heart disease    . Heart disease Father   . Renal Disease Father   . Heart disease Brother   . Dementia Mother    History  Substance Use Topics  . Smoking status: Never Smoker   . Smokeless tobacco: Never Used  . Alcohol Use: No    Review of Systems  Constitutional: Negative.   HENT: Negative.   Respiratory: Positive for shortness of breath.   Cardiovascular: Positive for chest pain.  Gastrointestinal: Positive for nausea.  Musculoskeletal: Positive for myalgias and gait problem.       Chronic limp right leg  Skin: Negative.   Neurological: Positive for numbness.       Chronic numbness of right leg  Psychiatric/Behavioral: Negative.   All other systems reviewed and are negative.     Allergies  Review of patient's allergies indicates no known allergies.  Home Medications   Prior to Admission medications   Medication Sig Start Date End Date Taking? Authorizing Provider  ACCU-CHEK SOFTCLIX LANCETS lancets Use three times daily 11/16/13   Ricard Dillon, MD  allopurinol (ZYLOPRIM) 100 MG tablet Take 100 mg by mouth 2 (two) times daily.     Historical Provider, MD  aspirin 81 MG tablet Take 81 mg by mouth daily.      Historical Provider, MD  Blood Glucose Monitoring Suppl (ACCU-CHEK AVIVA) device Use as instructed 11/16/13 11/16/14  Ricard Dillon, MD  calcium acetate (PHOSLO) 667 MG capsule Take 667 mg by mouth 3 (three) times daily  with meals.    Historical Provider, MD  clopidogrel (PLAVIX) 75 MG tablet Take 75 mg by mouth at bedtime.     Historical Provider, MD  diazepam (VALIUM) 5 MG tablet Take 1 tablet (5 mg total) by mouth at bedtime. 05/16/14   [name], MD  gabapentin (NEURONTIN) 300 MG capsule Take 1 capsule (300 mg total) by mouth 2 (two) times daily. 06/25/14   [name], MD  glucose blood (ACCU-CHEK AVIVA PLUS) test strip Test three times daily 11/16/13   Ricard Dillon, MD  insulin glargine (LANTUS) 100 UNIT/ML injection Inject 90 Units into the skin at bedtime.      Historical Provider, MD  insulin lispro protamine-lispro (HUMALOG 75/25 MIX) (75-25) 100 UNIT/ML SUSP injection Inject 50 Units into the skin 2 (two) times daily with a meal. 07/21/13   Ricard Dillon, MD  metoprolol tartrate (LOPRESSOR) 25 MG tablet Take 12.5 mg by mouth daily.    Historical Provider, MD  multivitamin (RENA-VIT) TABS tablet Take 1 tablet by mouth every morning.     Historical Provider, MD  omeprazole (PRILOSEC) 20 MG capsule Take 20 mg by mouth daily.    Historical Provider, MD  solifenacin (VESICARE) 5 MG tablet Take 5 mg by mouth every morning.     Historical Provider, MD  Tamsulosin HCl (FLOMAX) 0.4 MG CAPS Take 0.4 mg by mouth every morning.     Historical Provider, MD  topiramate (TOPAMAX) 100 MG tablet TAKE 1 TABLET (100 MG TOTAL) BY MOUTH 2 (TWO) TIMES DAILY. 08/22/14   [name], MD  traMADol (ULTRAM) 50 MG tablet Take 1 tablet (50 mg total) by mouth every 8 (eight) hours as needed. For pain. Take everyday per patient 07/25/14   [name], MD   BP 129/74 mmHg  Pulse 77  Temp(Src) 97.9 F (36.6 C) (Oral)  Resp 13  SpO2 100% Physical Exam  Constitutional: He appears well-developed and well-nourished.  HENT:  Head: Normocephalic and atraumatic.  Eyes: Conjunctivae are normal. Pupils are equal, round, and reactive to light.  Neck: Neck supple. No tracheal deviation present. No thyromegaly present.   Cardiovascular: Normal rate and regular rhythm.   No murmur heard. Pulmonary/Chest: Effort normal and breath sounds normal.  Abdominal: Soft. Bowel sounds are normal. He exhibits no distension. There is no tenderness.  Musculoskeletal: Normal range of motion. He exhibits no edema or tenderness.  Dialysis fistula left arm with good thrill  Neurological: He is alert. Coordination normal.  Skin: Skin is warm and dry. No rash noted.  Psychiatric: He has a  normal mood and affect.  Nursing note and vitals reviewed.  ED Course  Procedures (including critical care time) Labs Review Labs Reviewed  CBC WITH DIFFERENTIAL/PLATELET  BASIC METABOLIC PANEL  TROPONIN I    Imaging Review No results found.   EKG Interpretation   Date/Time:  Tuesday Oct 23 2014 10:57:57 EDT Ventricular Rate:  76 PR Interval:  185 QRS Duration: 97 QT Interval:  417 QTC Calculation: 469 R Axis:   -36 Text Interpretation:  Sinus rhythm Ventricular premature complex Inferior  infarct, old Lateral leads are also involved Baseline wander in lead(s) V1  No significant change since last tracing Confirmed by Winfred Leeds  MD, Richa Shor  [phone]) on 10/23/2014 11:06:43 AM      Chest xray viewed by me 12:25 PM pain somewhat improved after treatment with one sublingual nitroglycerin 140 p.m. chest pain free after treatment with 2 sublingual nitroglycerin. chest x-ray viewed by me Results for orders placed or performed during the hospital encounter of 10/23/14  CBC with Differential/Platelet  Result Value Ref Range   WBC 5.5 4.0 - 10.5 K/uL   RBC 4.46 4.22 - 5.81 MIL/uL   Hemoglobin 13.0 13.0 - 17.0 g/dL   HCT 39.8 39.0 - 52.0 %   MCV 89.2 78.0 - 100.0 fL   MCH 29.1 26.0 - 34.0 pg   MCHC 32.7 30.0 - 36.0 g/dL   RDW 15.0 11.5 - 15.5 %   Platelets 220 150 - 400 K/uL   Neutrophils Relative % 77 43 - 77 %   Neutro Abs 4.2 1.7 - 7.7 K/uL   Lymphocytes Relative 13 12 - 46 %   Lymphs Abs 0.7 0.7 - 4.0 K/uL   Monocytes  Relative 8 3 - 12 %   Monocytes Absolute 0.4 0.1 - 1.0 K/uL   Eosinophils Relative 2 0 - 5 %   Eosinophils Absolute 0.1 0.0 - 0.7 K/uL   Basophils Relative 0 0 - 1 %   Basophils Absolute 0.0 0.0 - 0.1 K/uL  Basic metabolic panel  Result Value Ref Range   Sodium 134 (L) 135 - 145 mmol/L   Potassium 4.1 3.5 - 5.1 mmol/L   Chloride 88 (L) 101 - 111 mmol/L   CO2 30 22 - 32 mmol/L   Glucose, Bld 112 (H) 65 - 99 mg/dL   BUN 28 (H) 6 - 20 mg/dL   Creatinine, Ser 7.06 (H) 0.61 - 1.24 mg/dL   Calcium 7.9 (L) 8.9 - 10.3 mg/dL   GFR calc non Af Amer 7 (L) >60 mL/min   GFR calc Af Amer 8 (L) >60 mL/min   Anion gap 16 (H) 5 - 15  Troponin I  Result Value Ref Range   Troponin I <0.03 <0.031 ng/mL   Dg Chest Port 1 View  10/23/2014   CLINICAL DATA:  Development of cramps and soreness in his extremities this morning during dialysis, history CHF, diabetes, essential hypertension, coronary artery disease post MI  EXAM: PORTABLE CHEST - 1 VIEW  COMPARISON:  Portable exam 1146 hours compared to 02/27/2011  FINDINGS: Enlargement of cardiac silhouette.  Mediastinal contours and pulmonary vascularity normal.  With minimal RIGHT basilar atelectasis.  Lungs otherwise clear.  No pleural effusion or pneumothorax.  No acute osseous findings.  IMPRESSION: Enlargement of cardiac silhouette with minimal RIGHT basilar atelectasis.   Electronically Signed   By: Lavonia Dana M.D.   On: 10/23/2014 12:19    MDM  2pm spoke with hospitalist service who will arrange for inpt stay ntg ointment  ordered  Final diagnoses:  Chest pain   Dx chest pain     Orlie Dakin, MD 10/23/14 1416

## 2014-10-23 NOTE — H&P (Deleted)
[facility] Consultation note                                                                                    [name], is a 64 y.o. male  MRN: [id number]   DOB - 29-Aug-1950  Admit Date - 10/23/2014  Outpatient Primary MD for the patient is Garret Reddish, MD  Referring MD: Winfred Leeds / ER  Consulting MD: Burt Knack / Cardiology  With History of -  Past Medical History  Diagnosis Date  . Coronary atherosclerosis of native coronary artery   . Other malaise and fatigue   . Chest pain, unspecified   . Other dyspnea and respiratory abnormality   . Personal history of unspecified circulatory disease   . Dysphagia, unspecified(787.20)   . Internal hemorrhoids without mention of complication   . Hypertrophy of prostate with urinary obstruction and other lower urinary tract symptoms (LUTS)   . Nausea alone   . Gastroparesis   . Backache, unspecified   . Posttraumatic stress disorder   . Hemorrhage of rectum and anus   . Allergy   . Diabetes mellitus 2004  . CHF (congestive heart failure)   . Hyperlipidemia   . Arthritis   . Dizziness   . Headache(784.0)   . Obesity   . Chills   . Complication of anesthesia     01/2011 could not eat,, hospt x2, was placed on hd and cleared up  . Myocardial infarction 2002  . Stroke     2007  . Unspecified essential hypertension     hx htn   . Chronic kidney disease, stage III (moderate)     HD T- TH-SAT  . Sleep apnea     USES CPAP   . Angina     LAST HAD CP 2 MO AGO  DR WALL Adams  . INTERNAL HEMORRHOIDS 10/16/2008    Qualifier: Diagnosis of  By: Nolon Rod CMA Deborra Medina), Shirlean Mylar        Past Surgical History  Procedure Laterality Date  . Hemorrhoid surgery    . Anal fissurectomy    . Av fistula placement    . Revision of fistula      renal failure  . Cardiac catheterization      2005 DR BRODIE  . Ventral hernia repair  07/01/2011    Procedure: HERNIA REPAIR VENTRAL ADULT;  Surgeon: Joyice Faster. Cornett, MD;  Location: [facility];  Service: General;  Laterality: N/A;    in for   Chief Complaint  Patient presents with  . Chest Pain  . 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 Patient presents after developing chest pain while on dialysis. Patient reports he was asleep when he was awakened by the staff who reported that his blood pressure was low.  After awakening he developed leg and feet cramps and then started developing cramps in his left chest which later became more dependent intense and also radiated to the left side. After blood pressure was improved he was given SL nitroglycerin 2 with improvement in the chest pain but he developed a headache. At the same time he was experiencing facial jaw and teeth aching which has not resolved despite nitroglycerin. He said during the episode he became extremely diaphoretic to the point his clothing had to be removed. He is currently not having any chest pain he has nitroglycerin ointment inch in place. He is primarily complaining of the jaw and teeth achiness. He is currently not short of breath and diaphoresis has resolved. EKG in the ER showed no acute ischemic changes and only old inferior infarct this compared with previous EKG August 2014. He was afebrile with blood pressure 129/74 maintain room air sats of 100%. He apparently completed his dialysis today and creatinine 7.06 with an anion gap of 16, troponin less than 0.03, CBC normal, glucose 112. Patient reports that at least one time per week he walks equivalent of 1 mile through his  neighborhood and does not have any chest pain or shortness of breath while walking.   **ADDENDUM** Dr. Burt Knack with Cardiology has evaluated this pt/knows him from redent evaluation. CP resolved and sx's are atypical. If next TNI is negative OK from Cardiology standpoint to dc. No other reason to remain IP so plan is to dc. TNI due no so will ck stat  DC PLAN: 1) Discharge to home 2) no medication changes 3) Resume HD Thursday 4) Follow up with your Cardiologist at [place]   In addition to the HPI above,  No Fever-chills, myalgias or other constitutional symptoms No changes with Vision or hearing, new weakness, tingling, numbness in any extremity, No problems swallowing food or Liquids, indigestion/reflux No Cough or Shortness of Breath, palpitations, orthopnea or DOE No Abdominal pain, N/V; no melena or hematochezia, no dark tarry stools, Bowel movements are regular, No dysuria, hematuria or flank pain No new skin rashes, lesions, masses or bruises, No new joints pains-aches No recent weight gain or loss No polyuria, polydypsia or polyphagia,  *A full 10 point Review of Systems was done, except as stated above, all other Review of Systems were negative.  Social History History  Substance Use Topics  . Smoking status: Never Smoker   . Smokeless tobacco: Never Used  . Alcohol Use: No    Resides at: Private residence  Lives with: Wife  Ambulatory status: Ambulatory for the most part without assistive devices but occasionally requires a walker because of exacerbation of peripheral neuropathy   Family History Family History  Problem Relation Age of Onset  . Diabetes Sister   . Heart disease Sister   . Diabetes Maternal Aunt   . Diabetes Maternal Uncle   . Diabetes Paternal Aunt   . Diabetes Paternal Uncle   . Heart disease    . Heart disease Father   . Renal Disease Father   . Heart disease Brother   . Dementia Mother      Prior to Admission  medications   Medication Sig Start Date End Date Taking? Authorizing Provider  ACCU-CHEK SOFTCLIX LANCETS lancets Use three times daily 11/16/13  Yes Ricard Dillon, MD  allopurinol (ZYLOPRIM) 100 MG tablet Take 100 mg by mouth 2 (two) times daily.    Yes Historical Provider, MD  aspirin 81 MG  tablet Take 81 mg by mouth daily.     Yes Historical Provider, MD  Blood Glucose Monitoring Suppl (ACCU-CHEK AVIVA) device Use as instructed 11/16/13 11/16/14 Yes Ricard Dillon, MD  calcium acetate (PHOSLO) 667 MG capsule Take 667 mg by mouth 3 (three) times daily with meals.   Yes Historical Provider, MD  clopidogrel (PLAVIX) 75 MG tablet Take 75 mg by mouth once.    Yes Historical Provider, MD  diazepam (VALIUM) 5 MG tablet Take 1 tablet (5 mg total) by mouth at bedtime. 05/16/14  Yes [name], MD  gabapentin (NEURONTIN) 300 MG capsule Take 1 capsule (300 mg total) by mouth 2 (two) times daily. 06/25/14  Yes [name], MD  glucose blood (ACCU-CHEK AVIVA PLUS) test strip Test three times daily 11/16/13  Yes Ricard Dillon, MD  insulin glargine (LANTUS) 100 UNIT/ML injection Inject 90 Units into the skin at bedtime.     Yes Historical Provider, MD  insulin lispro protamine-lispro (HUMALOG 75/25 MIX) (75-25) 100 UNIT/ML SUSP injection Inject 50 Units into the skin 2 (two) times daily with a meal. 07/21/13  Yes Ricard Dillon, MD  multivitamin (RENA-VIT) TABS tablet Take 1 tablet by mouth every morning.    Yes Historical Provider, MD  omeprazole (PRILOSEC) 20 MG capsule Take 20 mg by mouth daily.   Yes Historical Provider, MD  solifenacin (VESICARE) 5 MG tablet Take 5 mg by mouth every morning.    Yes Historical Provider, MD  Tamsulosin HCl (FLOMAX) 0.4 MG CAPS Take 0.4 mg by mouth every morning.    Yes Historical Provider, MD  topiramate (TOPAMAX) 100 MG tablet TAKE 1 TABLET (100 MG TOTAL) BY MOUTH 2 (TWO) TIMES DAILY. 08/22/14  Yes [name], MD  traMADol (ULTRAM) 50 MG tablet Take 1 tablet (50  mg total) by mouth every 8 (eight) hours as needed. For pain. Take everyday per patient 07/25/14  Yes [name], MD    No Known Allergies  Physical Exam  Vitals  Blood pressure 102/57, pulse 74, temperature 97.9 F (36.6 C), temperature source Oral, resp. rate 11, SpO2 99 %.   General:  In mild distress as evidenced by headache and ongoing jaw and teeth pain  Psych:  Normal affect, Denies Suicidal or Homicidal ideations, Awake Alert, Oriented X 3. Speech and thought patterns are clear and appropriate, no apparent short term memory deficits; low delivered speech pattern  Neuro:   No focal neurological deficits, CN II through XII intact, Strength 5/5 all 4 extremities, Sensation intact all 4 extremities.  ENT:  Ears and Eyes appear Normal, Conjunctivae clear, PER. Moist oral mucosa without erythema or exudates.  Neck:  Supple, No lymphadenopathy appreciated  Respiratory:  Symmetrical chest wall movement, Good air movement bilaterally, CTAB. Room Air  Cardiac:  RRR, No Murmurs, no LE edema noted, no JVD, No carotid bruits, peripheral pulses palpable at 2+  Abdomen:  Positive bowel sounds, Soft, Non tender, Non distended,  No masses appreciated, no obvious hepatosplenomegaly  Skin:  No Cyanosis, Normal Skin Turgor, No Skin Rash or Bruise.  Extremities: Symmetrical without obvious trauma or injury,  no effusions.  Data Review  CBC  Recent Labs Lab 10/23/14 1131  WBC 5.5  HGB 13.0  HCT 39.8  PLT 220  MCV 89.2  MCH 29.1  MCHC 32.7  RDW 15.0  LYMPHSABS 0.7  MONOABS 0.4  EOSABS 0.1  BASOSABS 0.0    Chemistries   Recent Labs Lab 10/23/14 1131  NA  134*  K 4.1  CL 88*  CO2 30  GLUCOSE 112*  BUN 28*  CREATININE 7.06*  CALCIUM 7.9*    CrCl cannot be calculated (Unknown ideal weight.).  No results for input(s): TSH, T4TOTAL, T3FREE, THYROIDAB in the last 72 hours.  Invalid input(s): FREET3  Coagulation profile No results for input(s): INR, PROTIME in  the last 168 hours.  No results for input(s): DDIMER in the last 72 hours.  Cardiac Enzymes  Recent Labs Lab 10/23/14 1131  TROPONINI <0.03    Invalid input(s): POCBNP  Urinalysis    Component Value Date/Time   COLORURINE AMBER* 12/25/2012 1412   APPEARANCEUR CLOUDY* 12/25/2012 1412   LABSPEC 1.017 12/25/2012 1412   PHURINE 6.0 12/25/2012 1412   GLUCOSEU NEGATIVE 12/25/2012 1412   HGBUR NEGATIVE 12/25/2012 1412   BILIRUBINUR MODERATE* 12/25/2012 1412   KETONESUR 15* 12/25/2012 1412   PROTEINUR 100* 12/25/2012 1412   UROBILINOGEN 1.0 12/25/2012 1412   NITRITE NEGATIVE 12/25/2012 1412   LEUKOCYTESUR MODERATE* 12/25/2012 1412    Imaging results:   Dg Chest Port 1 View  10/23/2014   CLINICAL DATA:  Development of cramps and soreness in his extremities this morning during dialysis, history CHF, diabetes, essential hypertension, coronary artery disease post MI  EXAM: PORTABLE CHEST - 1 VIEW  COMPARISON:  Portable exam 1146 hours compared to 02/27/2011  FINDINGS: Enlargement of cardiac silhouette.  Mediastinal contours and pulmonary vascularity normal.  With minimal RIGHT basilar atelectasis.  Lungs otherwise clear.  No pleural effusion or pneumothorax.  No acute osseous findings.  IMPRESSION: Enlargement of cardiac silhouette with minimal RIGHT basilar atelectasis.   Electronically Signed   By: Lavonia Dana M.D.   On: 10/23/2014 12:19     EKG: (Independently reviewed) sinus rhythm, occasional PVC, old inferior infarct, essentially unchanged from EKG August 2014, QTC 469 ms   Assessment & Plan  Principal Problem:   Chest pain/CAD -s/p DES to marginal vessel at [facility] -Admit to telemetry 3 W. with option to upgrade to stepdown status if recurrent chest pain becomes problematic -Consult cardiology -2-D echocardiogram -Cycle enzymes -Continue preadmission aspirin 81 mg with Plavix -Heart score = 5 -Continue Nitrol paste but may need to discontinue if headache becomes  problematic -Nothing by mouth until evaluated by cardiology  Active Problems:   Diabetes mellitus with insulin therapy -Medication reconciliation has not been completed by pharmacist and patient has both Lantus as well as 75/25 insulin listed -I have ordered the Lantus high-dose at hour of sleep for now -Provide sliding scale insulin -Check hemoglobin A1c    Essential hypertension -Blood pressure currently controlled -Not on definitive anti-hypertensive therapies noting only on Flomax for BPH -Not on beta blocker prior to admission    ESRD on dialysis -Usual dialysis days are TTS -Anticipate will discharge within next 24 hour so did not consult nephrology -Patient is on the transplant list    Obstructive sleep apnea -Provide CPAP in the event patient uses at hour of sleep    Lower extremity neuropathy -Continue preadmission Ultram; a shot reports does not tolerate narcotic pain medications -Continue Neurontin    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 ANP on 10/23/2014 at 2:55 PM  Between 7am to 7pm - Pager - [phone]  After 7pm go to [email or web address] - password [email or web address]  And look for the night coverage person covering me after hours  [facility]

## 2014-10-23 NOTE — Consult Note (Signed)
[facility] Consultation note                                                                                    [name], is a 64 y.o. male  MRN: [id number]   DOB - September 13, 1950  Admit Date - 10/23/2014  Outpatient Primary MD for the patient is Garret Reddish, MD  Referring MD: Winfred Leeds / ER  Consulting MD: Burt Knack / Cardiology  With History of -  Past Medical History  Diagnosis Date  . Coronary atherosclerosis of native coronary artery   . Other malaise and fatigue   . Chest pain, unspecified   . Other dyspnea and respiratory abnormality   . Personal history of unspecified circulatory disease   . Dysphagia, unspecified(787.20)   . Internal hemorrhoids without mention of complication   . Hypertrophy of prostate with urinary obstruction and other lower urinary tract symptoms (LUTS)   . Nausea alone   . Gastroparesis   . Backache, unspecified   . Posttraumatic stress disorder   . Hemorrhage of rectum and anus   . Allergy   . Diabetes mellitus 2004  . CHF (congestive heart failure)   . Hyperlipidemia   . Arthritis   . Dizziness   . Headache(784.0)   . Obesity   . Chills   . Complication of anesthesia     01/2011 could not eat,, hospt x2, was placed on hd and cleared up  . Myocardial infarction 2002  . Stroke     2007  . Unspecified essential hypertension     hx htn   . Chronic kidney disease, stage III (moderate)     HD T- TH-SAT  . Sleep apnea     USES CPAP   . Angina     LAST HAD CP 2 MO AGO  DR WALL   . INTERNAL HEMORRHOIDS 10/16/2008    Qualifier: Diagnosis of  By: Nolon Rod CMA Deborra Medina), Shirlean Mylar        Past Surgical History  Procedure Laterality Date  . Hemorrhoid surgery    . Anal fissurectomy    . Av fistula placement    . Revision of fistula      renal failure  . Cardiac catheterization      2005 DR BRODIE  . Ventral hernia repair  07/01/2011    Procedure: HERNIA REPAIR VENTRAL ADULT;  Surgeon: Joyice Faster. Cornett, MD;  Location: [place];  Service: General;  Laterality: N/A;    in for   Chief Complaint  Patient presents with  . Chest Pain  . 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 Patient presents after developing chest pain while on dialysis. Patient reports he was asleep when he was awakened by the staff who reported that his blood pressure was low.  After awakening he developed leg and feet cramps and then started developing cramps in his left chest which later became more dependent intense and also radiated to the left side. After blood pressure was improved he was given SL nitroglycerin 2 with improvement in the chest pain but he developed a headache. At the same time he was experiencing facial jaw and teeth aching which has not resolved despite nitroglycerin. He said during the episode he became extremely diaphoretic to the point his clothing had to be removed. He is currently not having any chest pain he has nitroglycerin ointment inch in place. He is primarily complaining of the jaw and teeth achiness. He is currently not short of breath and diaphoresis has resolved. EKG in the ER showed no acute ischemic changes and only old inferior infarct this compared with previous EKG August 2014. He was afebrile with blood pressure 129/74 maintain room air sats of 100%. He apparently completed his dialysis today and creatinine 7.06 with an anion gap of 16, troponin less than 0.03, CBC normal, glucose 112. Patient reports that at least one time per week he walks equivalent of 1 mile through his  neighborhood and does not have any chest pain or shortness of breath while walking.   **ADDENDUM** Dr. Burt Knack with Cardiology has evaluated this pt/knows him from recent evaluation. CP resolved and sx's are atypical. If next TNI is negative OK from Cardiology standpoint to dc. No other reason to remain IP so plan is to dc. TNI due now so will ck stat  DC PLAN: 1) Discharge to home 2) no medication changes 3) Resume HD Thursday 4) Follow up with your Cardiologist at [place]   In addition to the HPI above,  No Fever-chills, myalgias or other constitutional symptoms No changes with Vision or hearing, new weakness, tingling, numbness in any extremity, No problems swallowing food or Liquids, indigestion/reflux No Cough or Shortness of Breath, palpitations, orthopnea or DOE No Abdominal pain, N/V; no melena or hematochezia, no dark tarry stools, Bowel movements are regular, No dysuria, hematuria or flank pain No new skin rashes, lesions, masses or bruises, No new joints pains-aches No recent weight gain or loss No polyuria, polydypsia or polyphagia,  *A full 10 point Review of Systems was done, except as stated above, all other Review of Systems were negative.  Social History History  Substance Use Topics  . Smoking status: Never Smoker   . Smokeless tobacco: Never Used  . Alcohol Use: No    Resides at: Private residence  Lives with: Wife  Ambulatory status: Ambulatory for the most part without assistive devices but occasionally requires a walker because of exacerbation of peripheral neuropathy   Family History Family History  Problem Relation Age of Onset  . Diabetes Sister   . Heart disease Sister   . Diabetes Maternal Aunt   . Diabetes Maternal Uncle   . Diabetes Paternal Aunt   . Diabetes Paternal Uncle   . Heart disease    . Heart disease Father   . Renal Disease Father   . Heart disease Brother   . Dementia Mother      Prior to Admission  medications   Medication Sig Start Date End Date Taking? Authorizing Provider  ACCU-CHEK SOFTCLIX LANCETS lancets Use three times daily 11/16/13  Yes Ricard Dillon, MD  allopurinol (ZYLOPRIM) 100 MG tablet Take 100 mg by mouth 2 (two) times daily.    Yes Historical Provider, MD  aspirin 81 MG  tablet Take 81 mg by mouth daily.     Yes Historical Provider, MD  Blood Glucose Monitoring Suppl (ACCU-CHEK AVIVA) device Use as instructed 11/16/13 11/16/14 Yes Ricard Dillon, MD  calcium acetate (PHOSLO) 667 MG capsule Take 667 mg by mouth 3 (three) times daily with meals.   Yes Historical Provider, MD  clopidogrel (PLAVIX) 75 MG tablet Take 75 mg by mouth once.    Yes Historical Provider, MD  diazepam (VALIUM) 5 MG tablet Take 1 tablet (5 mg total) by mouth at bedtime. 05/16/14  Yes [name], MD  gabapentin (NEURONTIN) 300 MG capsule Take 1 capsule (300 mg total) by mouth 2 (two) times daily. 06/25/14  Yes [name], MD  glucose blood (ACCU-CHEK AVIVA PLUS) test strip Test three times daily 11/16/13  Yes Ricard Dillon, MD  insulin glargine (LANTUS) 100 UNIT/ML injection Inject 90 Units into the skin at bedtime.     Yes Historical Provider, MD  insulin lispro protamine-lispro (HUMALOG 75/25 MIX) (75-25) 100 UNIT/ML SUSP injection Inject 50 Units into the skin 2 (two) times daily with a meal. 07/21/13  Yes Ricard Dillon, MD  multivitamin (RENA-VIT) TABS tablet Take 1 tablet by mouth every morning.    Yes Historical Provider, MD  omeprazole (PRILOSEC) 20 MG capsule Take 20 mg by mouth daily.   Yes Historical Provider, MD  solifenacin (VESICARE) 5 MG tablet Take 5 mg by mouth every morning.    Yes Historical Provider, MD  Tamsulosin HCl (FLOMAX) 0.4 MG CAPS Take 0.4 mg by mouth every morning.    Yes Historical Provider, MD  topiramate (TOPAMAX) 100 MG tablet TAKE 1 TABLET (100 MG TOTAL) BY MOUTH 2 (TWO) TIMES DAILY. 08/22/14  Yes [name], MD  traMADol (ULTRAM) 50 MG tablet Take 1 tablet (50  mg total) by mouth every 8 (eight) hours as needed. For pain. Take everyday per patient 07/25/14  Yes [name], MD    No Known Allergies  Physical Exam  Vitals  Blood pressure 112/70, pulse 93, temperature 97.9 F (36.6 C), temperature source Oral, resp. rate 13, SpO2 95 %.   General:  In mild distress as evidenced by headache and ongoing jaw and teeth pain  Psych:  Normal affect, Denies Suicidal or Homicidal ideations, Awake Alert, Oriented X 3. Speech and thought patterns are clear and appropriate, no apparent short term memory deficits; low delivered speech pattern  Neuro:   No focal neurological deficits, CN II through XII intact, Strength 5/5 all 4 extremities, Sensation intact all 4 extremities.  ENT:  Ears and Eyes appear Normal, Conjunctivae clear, PER. Moist oral mucosa without erythema or exudates.  Neck:  Supple, No lymphadenopathy appreciated  Respiratory:  Symmetrical chest wall movement, Good air movement bilaterally, CTAB. Room Air  Cardiac:  RRR, No Murmurs, no LE edema noted, no JVD, No carotid bruits, peripheral pulses palpable at 2+  Abdomen:  Positive bowel sounds, Soft, Non tender, Non distended,  No masses appreciated, no obvious hepatosplenomegaly  Skin:  No Cyanosis, Normal Skin Turgor, No Skin Rash or Bruise.  Extremities: Symmetrical without obvious trauma or injury,  no effusions.  Data Review  CBC  Recent Labs Lab 10/23/14 1131  WBC 5.5  HGB 13.0  HCT 39.8  PLT 220  MCV 89.2  MCH 29.1  MCHC 32.7  RDW 15.0  LYMPHSABS 0.7  MONOABS 0.4  EOSABS 0.1  BASOSABS 0.0    Chemistries   Recent Labs Lab 10/23/14 1131  NA  134*  K 4.1  CL 88*  CO2 30  GLUCOSE 112*  BUN 28*  CREATININE 7.06*  CALCIUM 7.9*    CrCl cannot be calculated (Unknown ideal weight.).  No results for input(s): TSH, T4TOTAL, T3FREE, THYROIDAB in the last 72 hours.  Invalid input(s): FREET3  Coagulation profile No results for input(s): INR, PROTIME in  the last 168 hours.  No results for input(s): DDIMER in the last 72 hours.  Cardiac Enzymes  Recent Labs Lab 10/23/14 1131  TROPONINI <0.03    Invalid input(s): POCBNP  Urinalysis    Component Value Date/Time   COLORURINE AMBER* 12/25/2012 1412   APPEARANCEUR CLOUDY* 12/25/2012 1412   LABSPEC 1.017 12/25/2012 1412   PHURINE 6.0 12/25/2012 1412   GLUCOSEU NEGATIVE 12/25/2012 1412   HGBUR NEGATIVE 12/25/2012 1412   BILIRUBINUR MODERATE* 12/25/2012 1412   KETONESUR 15* 12/25/2012 1412   PROTEINUR 100* 12/25/2012 1412   UROBILINOGEN 1.0 12/25/2012 1412   NITRITE NEGATIVE 12/25/2012 1412   LEUKOCYTESUR MODERATE* 12/25/2012 1412    Imaging results:   Dg Chest Port 1 View  10/23/2014   CLINICAL DATA:  Development of cramps and soreness in his extremities this morning during dialysis, history CHF, diabetes, essential hypertension, coronary artery disease post MI  EXAM: PORTABLE CHEST - 1 VIEW  COMPARISON:  Portable exam 1146 hours compared to 02/27/2011  FINDINGS: Enlargement of cardiac silhouette.  Mediastinal contours and pulmonary vascularity normal.  With minimal RIGHT basilar atelectasis.  Lungs otherwise clear.  No pleural effusion or pneumothorax.  No acute osseous findings.  IMPRESSION: Enlargement of cardiac silhouette with minimal RIGHT basilar atelectasis.   Electronically Signed   By: Lavonia Dana M.D.   On: 10/23/2014 12:19     EKG: (Independently reviewed) sinus rhythm, occasional PVC, old inferior infarct, essentially unchanged from EKG August 2014, QTC 469 ms   Assessment & Plan  Principal Problem:   Chest pain/CAD -s/p DES to marginal vessel at [facility] cardiology-see their note -Cycle enzymes x 1 more collection -Heart score = 5 **see above re: discharge from ER if 2nd TNI normal  Active Problems:   Diabetes mellitus with insulin therapy -Provided sliding scale insulin until dc    Essential hypertension -Blood pressure currently  controlled -Not on definitive anti-hypertensive therapies noting only on Flomax for BPH -Not on beta blocker prior to admission    ESRD on dialysis -Usual dialysis days are TTS -Patient is on the transplant list    Obstructive sleep apnea -Provide CPAP in the event patient uses at hour of sleep    Lower extremity neuropathy -Continue preadmission Ultram -Continue Neurontin    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 ANP on 10/23/2014 at 5:05 PM  Between 7am to 7pm - Pager - [phone]  After 7pm go to [email or web address] - password [email or web address]  And look for the night coverage person covering me after hours  [facility]

## 2014-10-23 NOTE — ED Notes (Signed)
Refused further nitro at this time. States it makes his eyes hurt. Reports it did help his pain go from an 8/10 to a 7/10.

## 2014-10-23 NOTE — ED Notes (Signed)
Pt presents with Chest and Bilateral leg cramping while in dialysis. Pt denies and SHOB, N/V, Diaphoresis. pt received 324 asa by EMS.

## 2014-10-23 NOTE — Consult Note (Signed)
CARDIOLOGY CONSULT NOTE   Patient ID: [name] MRN: [id number], DOB/AGE: 1950/11/26   Admit date: 10/23/2014 Date of Consult: 10/23/2014   Primary Physician: Garret Reddish, MD Primary Cardiologist: Dr. Rich Reining at [place]. Profile  64 year old morbidly obese AA male with PMH of HTN, history of CAD s/p multiple PCI, ESRD on HD TTS (followed by Dr. Lorrene Reid), OSA on CPAP, history of CVA, ascending aortic aneurysm, and history of right carotid artery disease present with CP and hypotension during HD   Problem List  Past Medical History  Diagnosis Date  . Coronary atherosclerosis of native coronary artery   . Other malaise and fatigue   . Chest pain, unspecified   . Other dyspnea and respiratory abnormality   . Personal history of unspecified circulatory disease   . Dysphagia, unspecified(787.20)   . Internal hemorrhoids without mention of complication   . Hypertrophy of prostate with urinary obstruction and other lower urinary tract symptoms (LUTS)   . Nausea alone   . Gastroparesis   . Backache, unspecified   . Posttraumatic stress disorder   . Hemorrhage of rectum and anus   . Allergy   . Diabetes mellitus 2004  . CHF (congestive heart failure)   . Hyperlipidemia   . Arthritis   . Dizziness   . Headache(784.0)   . Obesity   . Chills   . Complication of anesthesia     01/2011 could not eat,, hospt x2, was placed on hd and cleared up  . Myocardial infarction 2002  . Stroke     2007  . Unspecified essential hypertension     hx htn   . Chronic kidney disease, stage III (moderate)     HD T- TH-SAT  . Sleep apnea     USES CPAP   . Angina     LAST HAD CP 2 MO AGO  DR WALL [place]  . INTERNAL HEMORRHOIDS 10/16/2008    Qualifier: Diagnosis of  By: Nolon Rod CMA Deborra Medina), Shirlean Mylar      Past Surgical History  Procedure Laterality Date  . Hemorrhoid surgery    . Anal fissurectomy    . Av fistula placement    . Revision of fistula      renal failure  .  Cardiac catheterization      2005 DR BRODIE  . Ventral hernia repair  07/01/2011    Procedure: HERNIA REPAIR VENTRAL ADULT;  Surgeon: Joyice Faster. Cornett, MD;  Location: [facility] OR;  Service: General;  Laterality: N/A;     Allergies  No Known Allergies  HPI   This is a 64 year old morbidly obese AA male with PMH of HTN, history of CAD s/p multiple PCI, ESRD on HD TTS (followed by Dr. Lorrene Reid), OSA on CPAP, history of CVA, ascending aortic aneurysm, and history of right carotid artery disease. Patient has been previously followed by Dr. Verl Blalock, and has had cardiac catheterization in 2002, 2003 and 2005. He has since switched his cardiologist to Dr. Rich Reining at [facility] after Dr. Verl Blalock retired. His most recent cardiac catheterization in 2014 showed 25% left main, 50% mid LAD, 50% D1, 75% OM1, 50% mid left circumflex, 95% nondominant RCA stenosis. He underwent PCI/DES placement to OM1. He has since been doing okay at home. He denies any strenuous activity but states he has been fairly active for his body size and has not experienced any exertional chest discomfort. He underwent a dobutamine stress echo in November 2014 which was negative for ischemia at target heart  rate. Regular echocardiogram obtained in November 2014 showed EF 65-70%, moderate asymmetric left ventricular hypertrophy, moderately dilated ascending aorta measuring 4.7 cm. He has been seen by cardiothoracic surgery for aneurysm and has been followed up as outpatient in preparation for surgery once size >5.5cm. He has been on the renal transplant list.   According to the patient, during his hemodialysis on TTS, his average ultrafiltration should be between 3.5-4 L. He attended his regular hemodialysis session in the morning of 10/23/2014 and was told that he will need to have 5 L drawn off. During hemodialysis, he initially felt well and eventually fell asleep. He woke up around 9:20 AM during hemodialysis and was told by the nursing staff he had low  blood pressure with systolic blood pressure of 74. That's when he started having a right-sided sharp stabbing like chest pain radiating to the jaw and neck. According to the patient this is different from his previous anginal pain. The episode of chest pain lasted roughly 6 hours before improving. Patient states the chest discomfort felt more like cramping and insist on finishing her hemodialysis, however hemodialysis center decided to contact EMS and transferred the patient to [facility] for further evaluation.  On arrival, significant laboratory finding include creatinine of 7.06, normal CBC, negative troponin. Chest x-ray was negative for acute process other than minimal right basilar atelectasis. EKG showed normal sinus rhythm with T-wave inversion in the lateral leads, and poor R-wave progression. EKG has been unchanged when compared to the previous EKG in August 2014. Cardiology has been consulted for atypical chest pain.   Inpatient Medications  . insulin aspart  0-15 Units Subcutaneous TID WC  . insulin aspart  0-5 Units Subcutaneous QHS  . nitroGLYCERIN  1 inch Topical 4 times per day    Family History Family History  Problem Relation Age of Onset  . Diabetes Sister   . Heart disease Sister   . Diabetes Maternal Aunt   . Diabetes Maternal Uncle   . Diabetes Paternal Aunt   . Diabetes Paternal Uncle   . Heart disease    . Heart disease Father   . Renal Disease Father   . Heart disease Brother   . Dementia Mother      Social History History   Social History  . Marital Status: Married    Spouse Name: N/A  . Number of Children: N/A  . Years of Education: N/A   Occupational History  . disabled    Social History Main Topics  . Smoking status: Never Smoker   . Smokeless tobacco: Never Used  . Alcohol Use: No  . Drug Use: No  . Sexual Activity: Not Currently   Other Topics Concern  . Not on file   Social History Narrative   Married 6845. 73 year old son in  78. 1 granddaughter from [place].       Retired from [employer]. 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  Cardiovascular:  No dyspnea on exertion, edema, orthopnea, palpitations, paroxysmal nocturnal dyspnea. +R sided sharp chest pain worse with palpation. Dermatological: No rash, lesions/masses Respiratory: No cough, dyspnea Urologic: No hematuria, dysuria Abdominal:   No nausea, vomiting, diarrhea, bright red blood per rectum, melena, or hematemesis Neurologic:  No visual changes, wkns, changes in mental status. All  other systems reviewed and are otherwise negative except as noted above.  Physical Exam  Blood pressure 120/71, pulse 84, temperature 97.9 F (36.6 C), temperature source Oral, resp. rate 11, SpO2 94 %.  General: Pleasant, NAD Psych: Normal affect. Neuro: Alert and oriented X 3. Moves all extremities spontaneously. HEENT: Normal  Neck: Supple without bruits or JVD. Lungs:  Resp regular and unlabored. R basilar rale, otherwise CTA Heart: RRR no s3, s4, or murmurs. L arm AVF noted.  Abdomen: Soft, non-tender, non-distended, BS + x 4.  Extremities: No clubbing, cyanosis or edema. DP/PT/Radials 2+ and equal bilaterally.  Labs   Recent Labs  10/23/14 1131  TROPONINI <0.03   Lab Results  Component Value Date   WBC 5.5 10/23/2014   HGB 13.0 10/23/2014   HCT 39.8 10/23/2014   MCV 89.2 10/23/2014   PLT 220 10/23/2014    Recent Labs Lab 10/23/14 1131  NA 134*  K 4.1  CL 88*  CO2 30  BUN 28*  CREATININE 7.06*  CALCIUM 7.9*  GLUCOSE 112*   Lab Results  Component Value Date   CHOL 233* 07/29/2011   HDL 48.20 07/29/2011   LDLCALC 56 04/19/2009   TRIG 223.0* 07/29/2011   No results found for: DDIMER  Radiology/Studies  Dg Chest Port 1 View  10/23/2014   CLINICAL  DATA:  Development of cramps and soreness in his extremities this morning during dialysis, history CHF, diabetes, essential hypertension, coronary artery disease post MI  EXAM: PORTABLE CHEST - 1 VIEW  COMPARISON:  Portable exam 1146 hours compared to 02/27/2011  FINDINGS: Enlargement of cardiac silhouette.  Mediastinal contours and pulmonary vascularity normal.  With minimal RIGHT basilar atelectasis.  Lungs otherwise clear.  No pleural effusion or pneumothorax.  No acute osseous findings.  IMPRESSION: Enlargement of cardiac silhouette with minimal RIGHT basilar atelectasis.   Electronically Signed   By: Lavonia Dana M.D.   On: 10/23/2014 12:19   Cath Result  12/2012 Cardiac Cath Finding COMMENTS: LM 25% LAD mid 50%, D1 50% LCX OM1 75%, mid LCX 50% RCA non dom, 95%  PCI OM1 to 0%, 2.5x32 DES   FINAL RESULTS:  Lesion Detail: 1st Marginal  Pretreatment: Balloon Primary: Drug Eluting Stent  Vessel Results: 1 vessel(s) attempted. 1 vessel(s) successfully dilated.  Lesion Results: 1 lesion(s) attempted. 1 lesion(s) successfully dilated.   Successful single vessel percutaneous coronary intervention  11/23/2003: Cardiac Cath Findings: CONCLUSIONS: 1. Nonobstructive coronary artery disease, but 40% narrowing of the diagonal  branch to the LAD, 70% narrowing in the ramus branches is circulatory,  and 90% stenosis in a small right ventricular branch of a nondominant  right coronary artery. 2. Marked left ventricular hypertrophy most probably related to hypertensive  cardiovascular disease with an elevated left ventricular pressure of 41.  RECOMMENDATIONS: The patient has mainly nonobstructive coronary disease; and, I think, this can be managed medically and it is not likely that he __________ before symptoms. He does have marked LVH and this may cause him symptoms of exertional dyspnea.   Eustace Quail, M.D.  [facility]  07/14/2001: Cardiac Cath Findings: LEFT VENTRICULOGRAM: The left ventriculogram was obtained in the RAO projection. The EF was 65% with normal to hyperdynamic wall motion.  CONCLUSION: Single-vessel coronary artery disease in a nondominant right coronary. This is not changed from his previous catheterization in November 2002.  PLAN: At this point, we should continue medical management. We could consider adding long-acting nitrate to his medical regimen. Dictated by: Minus Breeding, M.D. [place] Attending  Physician: Theressa Millard DD: 07/14/01 TD: 07/15/01 Job: [id number]  04/01/2001: Cardiac Cath Findings: IMPRESSIONS: 1. Significantly elevated left ventricular end-diastolic pressure secondary  to left ventricular hypertrophy. 2. Two-vessel coronary artery disease as described. There is moderate but  nonobstructive disease in the obtuse marginal branch. There is a diffuse  subtotal occlusion of the right ventricular branch of a nondominant  right coronary artery as described. Because of the small size of this  vessel and diffuse nature of the disease, it is not amenable to  percutaneous intervention.  PLAN: The patient will be managed with aggressive medical therapy. He will be started on low-dose aspirin and long-term Plavix. He will also be given aggressive anti-anginal therapy.  ECG  NSR with TWI in lateral leads, unchanged from previous EKG in 2014  ASSESSMENT AND PLAN  1. Atypical chest pain  - Right-sided, worse with palpation, question musculoskeletal vs demand ischemia in the setting hypotension  - different from previous angina.    - low suspicion for ACS, if trop negative overnight, likely can be discharged to followup with his cardiologist in [facility]  2. CAD s/p multiple PCI  - last cath 01/04/2013 25% left main, 50% mid LAD, 50% D1, 75% OM1, 50% mid left circumflex, 95% nondominant RCA stenosis. He underwent PCI/DES  placement to OM1  3.  ESRD on HD TTS (followed by Dr. Lorrene Reid) 4. OSA on CPAP 5. history of CVA 6. ascending aortic aneurysm 7. H/o right carotid artery disease  Signed, Woodward Ku 10/23/2014, 4:29 PM  Patient seen, examined. Available data reviewed. Agree with findings, assessment, and plan as outlined by Almyra Deforest, PA-C. The patient is independently interviewed and examined. He reports severe diffuse muscle spasms including the chest during dialysis today. He notes that more fluid was removed been normal. He continues to have some mild discomfort with tenderness to palpation over the right chest wall. He otherwise feels well now. He denies any recent exertional chest pain or pressure. No shortness of breath or edema. He was noted to be hypotensive today at dialysis. Troponin is negative. Exam is unremarkable with the exception of chest wall tenderness. This patient has highly atypical chest pain. I do not think he requires hospital admission. We are going to repeat a troponin level. If it is negative, he should be able to be safely discharged home. I recommended that he follow-up with his primary physician and cardiologist. Unless he has recurrent chest discomfort, I would not think that stress testing is indicated. His second troponin is currently pending and as long as it is negative, he can be discharged from my perspective.  Sherren Mocha, M.D. 10/23/2014 5:58 PM

## 2014-10-23 NOTE — ED Provider Notes (Signed)
Patient not seen by me. I received a request from admitting physician, Dr. Waldron Labs to discharge the patient. Patient seen by cardiology and internal medicine. I did confirm with Dr. Waldron Labs that he had discussed the case with Dr. Burt Knack, cardiology and it was recommended the patient to be discharged after the second troponin. Second troponin was negative. Patient to be discharged to follow-up with his cardiologist in [place].  Orpah Greek, MD 10/23/14 [phone]

## 2014-10-23 NOTE — ED Notes (Signed)
Spoke with Dr. Waldron Labs who stated that since the patient's troponin was negative he felt comfortable discharging him home.

## 2014-10-23 NOTE — Discharge Instructions (Signed)

## 2014-10-23 NOTE — ED Notes (Signed)
MD at bedside. 

## 2014-10-24 LAB — HEMOGLOBIN A1C
Hgb A1c MFr Bld: 7.6 % — ABNORMAL HIGH (ref 4.8–5.6)
MEAN PLASMA GLUCOSE: 171 mg/dL

## 2014-10-31 DIAGNOSIS — E785 Hyperlipidemia, unspecified: Secondary | ICD-10-CM | POA: Diagnosis not present

## 2014-10-31 DIAGNOSIS — I10 Essential (primary) hypertension: Secondary | ICD-10-CM | POA: Diagnosis not present

## 2014-10-31 DIAGNOSIS — I2510 Atherosclerotic heart disease of native coronary artery without angina pectoris: Secondary | ICD-10-CM | POA: Diagnosis not present

## 2014-11-01 ENCOUNTER — Encounter (HOSPITAL_COMMUNITY): Payer: Self-pay | Admitting: *Deleted

## 2014-11-01 ENCOUNTER — Telehealth: Payer: Self-pay | Admitting: Family Medicine

## 2014-11-01 ENCOUNTER — Emergency Department (HOSPITAL_COMMUNITY): Payer: Medicare Other

## 2014-11-01 ENCOUNTER — Emergency Department (HOSPITAL_COMMUNITY)
Admission: EM | Admit: 2014-11-01 | Discharge: 2014-11-01 | Disposition: A | Discharge status: Home / Self Care | Payer: Medicare Other | Attending: Emergency Medicine | Admitting: Emergency Medicine

## 2014-11-01 ENCOUNTER — Encounter: Payer: Self-pay | Admitting: Family Medicine

## 2014-11-01 DIAGNOSIS — I509 Heart failure, unspecified: Secondary | ICD-10-CM | POA: Diagnosis not present

## 2014-11-01 DIAGNOSIS — Z7982 Long term (current) use of aspirin: Secondary | ICD-10-CM | POA: Insufficient documentation

## 2014-11-01 DIAGNOSIS — I25119 Atherosclerotic heart disease of native coronary artery with unspecified angina pectoris: Secondary | ICD-10-CM | POA: Diagnosis not present

## 2014-11-01 DIAGNOSIS — Z8673 Personal history of transient ischemic attack (TIA), and cerebral infarction without residual deficits: Secondary | ICD-10-CM | POA: Diagnosis not present

## 2014-11-01 DIAGNOSIS — Z792 Long term (current) use of antibiotics: Secondary | ICD-10-CM | POA: Diagnosis not present

## 2014-11-01 DIAGNOSIS — Z87438 Personal history of other diseases of male genital organs: Secondary | ICD-10-CM | POA: Diagnosis not present

## 2014-11-01 DIAGNOSIS — I120 Hypertensive chronic kidney disease with stage 5 chronic kidney disease or end stage renal disease: Secondary | ICD-10-CM | POA: Insufficient documentation

## 2014-11-01 DIAGNOSIS — Z8719 Personal history of other diseases of the digestive system: Secondary | ICD-10-CM | POA: Diagnosis not present

## 2014-11-01 DIAGNOSIS — E119 Type 2 diabetes mellitus without complications: Secondary | ICD-10-CM | POA: Diagnosis not present

## 2014-11-01 DIAGNOSIS — Z7902 Long term (current) use of antithrombotics/antiplatelets: Secondary | ICD-10-CM | POA: Diagnosis not present

## 2014-11-01 DIAGNOSIS — N186 End stage renal disease: Secondary | ICD-10-CM

## 2014-11-01 DIAGNOSIS — Z79899 Other long term (current) drug therapy: Secondary | ICD-10-CM | POA: Insufficient documentation

## 2014-11-01 DIAGNOSIS — Z794 Long term (current) use of insulin: Secondary | ICD-10-CM | POA: Diagnosis not present

## 2014-11-01 DIAGNOSIS — Z9889 Other specified postprocedural states: Secondary | ICD-10-CM | POA: Diagnosis not present

## 2014-11-01 DIAGNOSIS — Z8709 Personal history of other diseases of the respiratory system: Secondary | ICD-10-CM | POA: Diagnosis not present

## 2014-11-01 DIAGNOSIS — I639 Cerebral infarction, unspecified: Secondary | ICD-10-CM

## 2014-11-01 DIAGNOSIS — E785 Hyperlipidemia, unspecified: Secondary | ICD-10-CM | POA: Diagnosis not present

## 2014-11-01 DIAGNOSIS — M1990 Unspecified osteoarthritis, unspecified site: Secondary | ICD-10-CM | POA: Diagnosis not present

## 2014-11-01 DIAGNOSIS — R531 Weakness: Secondary | ICD-10-CM | POA: Insufficient documentation

## 2014-11-01 DIAGNOSIS — E669 Obesity, unspecified: Secondary | ICD-10-CM | POA: Diagnosis not present

## 2014-11-01 DIAGNOSIS — R51 Headache: Secondary | ICD-10-CM | POA: Diagnosis not present

## 2014-11-01 DIAGNOSIS — Z992 Dependence on renal dialysis: Secondary | ICD-10-CM | POA: Diagnosis not present

## 2014-11-01 DIAGNOSIS — R29898 Other symptoms and signs involving the musculoskeletal system: Secondary | ICD-10-CM | POA: Diagnosis not present

## 2014-11-01 LAB — RAPID URINE DRUG SCREEN, HOSP PERFORMED
AMPHETAMINES: NOT DETECTED
BARBITURATES: NOT DETECTED
Benzodiazepines: POSITIVE — AB
COCAINE: NOT DETECTED
Opiates: NOT DETECTED
Tetrahydrocannabinol: NOT DETECTED

## 2014-11-01 LAB — URINALYSIS, ROUTINE W REFLEX MICROSCOPIC
Glucose, UA: NEGATIVE mg/dL
HGB URINE DIPSTICK: NEGATIVE
Ketones, ur: NEGATIVE mg/dL
Nitrite: NEGATIVE
PH: 7.5 (ref 5.0–8.0)
Protein, ur: 100 mg/dL — AB
Specific Gravity, Urine: 1.018 (ref 1.005–1.030)
Urobilinogen, UA: 1 mg/dL (ref 0.0–1.0)

## 2014-11-01 LAB — I-STAT CHEM 8, ED
BUN: 15 mg/dL (ref 6–20)
CHLORIDE: 95 mmol/L — AB (ref 101–111)
CREATININE: 5.5 mg/dL — AB (ref 0.61–1.24)
Calcium, Ion: 0.91 mmol/L — ABNORMAL LOW (ref 1.13–1.30)
GLUCOSE: 122 mg/dL — AB (ref 65–99)
HCT: 44 % (ref 39.0–52.0)
Hemoglobin: 15 g/dL (ref 13.0–17.0)
Potassium: 4.8 mmol/L (ref 3.5–5.1)
SODIUM: 136 mmol/L (ref 135–145)
TCO2: 27 mmol/L (ref 0–100)

## 2014-11-01 LAB — I-STAT TROPONIN, ED: TROPONIN I, POC: 0.03 ng/mL (ref 0.00–0.08)

## 2014-11-01 LAB — URINE MICROSCOPIC-ADD ON

## 2014-11-01 LAB — ETHANOL

## 2014-11-01 MED ORDER — AMOXICILLIN 500 MG PO CAPS
500.0000 mg | ORAL_CAPSULE | Freq: Three times a day (TID) | ORAL | Status: DC
  Administered 2014-11-01: 500 mg via ORAL
  Filled 2014-11-01: qty 1

## 2014-11-01 MED ORDER — ASPIRIN 81 MG PO CHEW
324.0000 mg | CHEWABLE_TABLET | Freq: Once | ORAL | Status: AC
  Administered 2014-11-01: 324 mg via ORAL
  Filled 2014-11-01: qty 4

## 2014-11-01 NOTE — Discharge Instructions (Signed)

## 2014-11-01 NOTE — Consult Note (Signed)
Referring Physician: Pollina    Chief Complaint: Right sided headache and right sided pain  HPI: [name] is an 64 y.o. male who reports that after dialysis today noted pain on his right side and right sided headache.  Reports the headache is above his right eyebrow.  Also feels the sensation is not intact on the right.  This is usually his good side.  He feels as if a neuropathy is starting on the right side.  At baseline he has poor sensation in the left arm and in the left leg particularly below the knee.  Although his symptoms stared quite a bit earlier today he did not present because he did not want to come to he hospital. Initial NIHSS of 5.    Date last known well: Date: 11/01/2014 Time last known well: Time: 12:45 tPA Given: No: Outside time window MRankin: 1  Past Medical History  Diagnosis Date  . Coronary atherosclerosis of native coronary artery   . Other malaise and fatigue   . Chest pain, unspecified   . Other dyspnea and respiratory abnormality   . Personal history of unspecified circulatory disease   . Dysphagia, unspecified(787.20)   . Internal hemorrhoids without mention of complication   . Hypertrophy of prostate with urinary obstruction and other lower urinary tract symptoms (LUTS)   . Nausea alone   . Gastroparesis   . Backache, unspecified   . Posttraumatic stress disorder   . Hemorrhage of rectum and anus   . Allergy   . Diabetes mellitus 2004  . CHF (congestive heart failure)   . Hyperlipidemia   . Arthritis   . Dizziness   . Headache(784.0)   . Obesity   . Chills   . Complication of anesthesia     01/2011 could not eat,, hospt x2, was placed on hd and cleared up  . Myocardial infarction 2002  . Stroke     2007  . Unspecified essential hypertension     hx htn   . Chronic kidney disease, stage III (moderate)     HD T- TH-SAT  . Sleep apnea     USES CPAP   . Angina     LAST HAD CP 2 MO AGO  DR WALL [place]  . INTERNAL HEMORRHOIDS 10/16/2008     Qualifier: Diagnosis of  By: Nolon Rod CMA Deborra Medina), Shirlean Mylar      Past Surgical History  Procedure Laterality Date  . Hemorrhoid surgery    . Anal fissurectomy    . Av fistula placement    . Revision of fistula      renal failure  . Cardiac catheterization      2005 DR BRODIE  . Ventral hernia repair  07/01/2011    Procedure: HERNIA REPAIR VENTRAL ADULT;  Surgeon: Joyice Faster. Cornett, MD;  Location: [facility];  Service: General;  Laterality: N/A;    Family History  Problem Relation Age of Onset  . Diabetes Sister   . Heart disease Sister   . Diabetes Maternal Aunt   . Diabetes Maternal Uncle   . Diabetes Paternal Aunt   . Diabetes Paternal Uncle   . Heart disease    . Heart disease Father   . Renal Disease Father   . Heart disease Brother   . Dementia Mother    Social History:  reports that he has never smoked. He has never used smokeless tobacco. He reports that he does not drink alcohol or use illicit drugs.  Allergies: No Known Allergies  Medications: I have reviewed the patient's current medications. Prior to Admission:  Current outpatient prescriptions:  .  ACCU-CHEK SOFTCLIX LANCETS lancets, Use three times daily, Disp: 200 each, Rfl: 3 .  allopurinol (ZYLOPRIM) 100 MG tablet, Take 100 mg by mouth 2 (two) times daily. , Disp: , Rfl:  .  aspirin 81 MG tablet, Take 81 mg by mouth daily.  , Disp: , Rfl:  .  Blood Glucose Monitoring Suppl (ACCU-CHEK AVIVA) device, Use as instructed, Disp: 1 each, Rfl: 0 .  calcium acetate (PHOSLO) 667 MG capsule, Take 667 mg by mouth 3 (three) times daily with meals., Disp: , Rfl:  .  clopidogrel (PLAVIX) 75 MG tablet, Take 75 mg by mouth once. , Disp: , Rfl:  .  diazepam (VALIUM) 5 MG tablet, Take 1 tablet (5 mg total) by mouth at bedtime., Disp: 30 tablet, Rfl: 5 .  gabapentin (NEURONTIN) 300 MG capsule, Take 1 capsule (300 mg total) by mouth 2 (two) times daily., Disp: 28 capsule, Rfl: 0 .  glucose blood (ACCU-CHEK AVIVA PLUS) test strip,  Test three times daily, Disp: 200 each, Rfl: 3 .  insulin glargine (LANTUS) 100 UNIT/ML injection, Inject 90 Units into the skin at bedtime.  , Disp: , Rfl:  .  insulin lispro protamine-lispro (HUMALOG 75/25 MIX) (75-25) 100 UNIT/ML SUSP injection, Inject 50 Units into the skin 2 (two) times daily with a meal., Disp: 30 mL, Rfl: 12 .  multivitamin (RENA-VIT) TABS tablet, Take 1 tablet by mouth every morning. , Disp: , Rfl:  .  omeprazole (PRILOSEC) 20 MG capsule, Take 20 mg by mouth daily., Disp: , Rfl:  .  solifenacin (VESICARE) 5 MG tablet, Take 5 mg by mouth every morning. , Disp: , Rfl:  .  Tamsulosin HCl (FLOMAX) 0.4 MG CAPS, Take 0.4 mg by mouth every morning. , Disp: , Rfl:  .  topiramate (TOPAMAX) 100 MG tablet, TAKE 1 TABLET (100 MG TOTAL) BY MOUTH 2 (TWO) TIMES DAILY., Disp: 90 tablet, Rfl: 1 .  traMADol (ULTRAM) 50 MG tablet, Take 1 tablet (50 mg total) by mouth every 8 (eight) hours as needed. For pain. Take everyday per patient, Disp: 30 tablet, Rfl: 1  ROS: History obtained from the patient  General ROS: negative for - chills, fatigue, fever, night sweats, weight gain or weight loss Psychological ROS: negative for - behavioral disorder, hallucinations, memory difficulties, mood swings or suicidal ideation Ophthalmic ROS: negative for - blurry vision, double vision, eye pain or loss of vision ENT ROS: negative for - epistaxis, nasal discharge, oral lesions, sore throat, tinnitus or vertigo Allergy and Immunology ROS: negative for - hives or itchy/watery eyes Hematological and Lymphatic ROS: negative for - bleeding problems, bruising or swollen lymph nodes Endocrine ROS: negative for - galactorrhea, hair pattern changes, polydipsia/polyuria or temperature intolerance Respiratory ROS: negative for - cough, hemoptysis, shortness of breath or wheezing Cardiovascular ROS: negative for - chest pain, dyspnea on exertion, edema or irregular heartbeat Gastrointestinal ROS: negative for -  abdominal pain, diarrhea, hematemesis, nausea/vomiting or stool incontinence Genito-Urinary ROS: negative for - dysuria, hematuria, incontinence or urinary frequency/urgency Musculoskeletal ROS: left leg neuropathy Neurological ROS: as noted in HPI Dermatological ROS: negative for rash and skin lesion changes  Physical Examination: Blood pressure 146/92, pulse 83, temperature 98.7 F (37.1 C), temperature source Oral, resp. rate 16, height 5' 10.5" (1.791 m), weight 104.781 kg (231 lb), SpO2 98 %.  HEENT-  Normocephalic, no lesions, without obvious abnormality.  Normal external eye and conjunctiva.  Normal TM's bilaterally.  Normal auditory canals and external ears. Normal external nose, mucus membranes and septum.  Normal pharynx. Cardiovascular- S1, S2 normal, pulses palpable throughout   Lungs- chest clear, no wheezing, rales, normal symmetric air entry Abdomen- soft, non-tender; bowel sounds normal; no masses,  no organomegaly Extremities- no edema Lymph-no adenopathy palpable Musculoskeletal-no joint tenderness, deformity or swelling Skin-warm and dry, no hyperpigmentation, vitiligo, or suspicious lesions  Neurological Examination Mental Status: Alert, oriented, thought content appropriate.  Speech fluent without evidence of aphasia.  Able to follow 3 step commands without difficulty. Cranial Nerves: II: Discs flat bilaterally; Visual fields grossly normal, pupils equal, round, reactive to light and accommodation III,IV, VI: ptosis not present, extra-ocular motions intact bilaterally V,VII: decreased right NLF, facial light touch sensation on the right side of the face VIII: hearing normal bilaterally IX,X: gag reflex present XI: bilateral shoulder shrug XII: midline tongue extension Motor: Right : Upper extremity   5/5    Left:     Upper extremity   5/5  Lower extremity   5/5     Lower extremity   Will not lift off the bed or give any effort, some movement noted   spontaneously Tone and bulk:normal tone throughout; no atrophy noted Sensory: Pinprick and light touch impaired all over but worse on the right as compared to the left except below the left knee where no sensation appreciated Deep Tendon Reflexes: 2+ in the upper extremities and absent in the lower extremities Plantars: Right: mute   Left: mute Cerebellar: normal finger-to-nose testing bilaterally and normal heel-to-shin testing on the right but will not attempt on the left but holds left leg above right without difficulty  Gait: not tested due to lack of cooperation    Laboratory Studies:  Basic Metabolic Panel:  Recent Labs Lab 11/01/14 1743  NA 136  K 4.8  CL 95*  GLUCOSE 122*  BUN 15  CREATININE 5.50*    Liver Function Tests: No results for input(s): AST, ALT, ALKPHOS, BILITOT, PROT, ALBUMIN in the last 168 hours. No results for input(s): LIPASE, AMYLASE in the last 168 hours. No results for input(s): AMMONIA in the last 168 hours.  CBC:  Recent Labs Lab 11/01/14 1743  HGB 15.0  HCT 44.0    Cardiac Enzymes: No results for input(s): CKTOTAL, CKMB, CKMBINDEX, TROPONINI in the last 168 hours.  BNP: Invalid input(s): POCBNP  CBG: No results for input(s): GLUCAP in the last 168 hours.  Microbiology: Results for orders placed or performed during the hospital encounter of 06/24/11  Surgical pcr screen     Status: None   Collection Time: 06/24/11  9:45 AM  Result Value Ref Range Status   MRSA, PCR NEGATIVE NEGATIVE Final   Staphylococcus aureus NEGATIVE NEGATIVE Final    Comment:        The Xpert SA Assay (FDA approved for NASAL specimens only), is one component of a comprehensive surveillance program.  It is not intended to diagnose infection nor to guide or monitor treatment.    Coagulation Studies: No results for input(s): LABPROT, INR in the last 72 hours.  Urinalysis: No results for input(s): COLORURINE, LABSPEC, PHURINE, GLUCOSEU, HGBUR,  BILIRUBINUR, KETONESUR, PROTEINUR, UROBILINOGEN, NITRITE, LEUKOCYTESUR in the last 168 hours.  Invalid input(s): APPERANCEUR  Lipid Panel:    Component Value Date/Time   CHOL 233* 07/29/2011 1322   TRIG 223.0* 07/29/2011 1322   TRIG 373* 04/27/2006 1123   HDL 48.20 07/29/2011 1322   CHOLHDL 5 07/29/2011 1322  CHOLHDL 5.5 CALC 04/27/2006 1123   VLDL 44.6* 07/29/2011 1322   LDLCALC 56 04/19/2009 1729    HgbA1C:  Lab Results  Component Value Date   HGBA1C 7.6* 10/23/2014    Urine Drug Screen:  No results found for: LABOPIA, COCAINSCRNUR, LABBENZ, AMPHETMU, THCU, LABBARB  Alcohol Level: No results for input(s): ETH in the last 168 hours.  Other results: EKG: normal sinus rhythm at 85 bpm, left axis devation.  Imaging: Ct Head Wo Contrast  11/01/2014   CLINICAL DATA:  Code stroke. RIGHT-sided weakness. Last seen normal at 1300 hr.  EXAM: CT HEAD WITHOUT CONTRAST  TECHNIQUE: Contiguous axial images were obtained from the base of the skull through the vertex without intravenous contrast.  COMPARISON:  CT head 04/2012.  FINDINGS: Generalized atrophy premature for age. Chronic microvascular ischemic change of a mild-to-moderate degree.  There is asymmetric hypoattenuation of the posterior limb internal capsule on the LEFT, not present previously. Other areas of suspected acute infarction affect the LEFT frontal operculum and LEFT superior temporal gyrus. Slight hyperattenuation of a sylvian fissure LEFT MCA M2 branch on image 14. No definite large vessel occlusion.  Calvarium is intact. BILATERAL maxillary sinus retention cysts. No sinus air-fluid level.  IMPRESSION: Suspected areas of acute infarction affecting LEFT posterior limb internal capsule, LEFT frontal operculum, and LEFT superior temporal lobe. No large vessel occlusion but suspected hyper attenuation possibly representing thrombus of a LEFT MCA M2 branch.  No intracranial hemorrhage.  Atrophy and small vessel disease similar priors.   Critical Value/emergent results were called by telephone at the time of interpretation on 11/01/2014 at 6:05 pm to Dr. Doy Mince, who verbally acknowledged these results.   Electronically Signed   By: Rolla Flatten M.D.   On: 11/01/2014 18:12    Assessment: 64 y.o. male presenting with complaints of right sided pain and headache.  No new significant weakness noted.  Head CT personally reviewed and shows some areas of hypodensity n the left internal capsule, frontal operculum and superior temporal lobe.  Patient outside time window for tPA.  Neurological examination not consistent with a large vessel event.  Patient on Plavix at home.  Further work up recommended.    Stroke Risk Factors - diabetes mellitus and hyperlipidemia  Plan: 1. HgbA1c, fasting lipid panel 2. MRI, MRA  of the brain without contrast 3. PT consult, OT consult, Speech consult 4. Echocardiogram 5. Carotid dopplers 6. Prophylactic therapy-continue Plavix at 75mg  daily 7. NPO until RN stroke swallow screen 8. Telemetry monitoring 9. Frequent neuro checks  Case discussed with Dr. Charlann Noss, MD [facility] [phone] 11/01/2014, 6:20 PM

## 2014-11-01 NOTE — ED Notes (Signed)
Pt states he completed his dialysis today, drove himself home.  He began having R sided pain and weakness at 1300.  No facial droop.  No weakness.  Pt c/o R sided headache.  Dr Betsey Holiday stated to call code stroke.

## 2014-11-01 NOTE — ED Notes (Signed)
Pt returned from MRI °

## 2014-11-01 NOTE — ED Notes (Signed)
Pt to room from CT  Dr. Doy Mince at bedside for assessment

## 2014-11-01 NOTE — ED Notes (Signed)
Pt stable, ambulatory, states understanding of discharge instructions 

## 2014-11-01 NOTE — ED Provider Notes (Signed)
CSN: [id number]     Arrival date & time 11/01/14  1700 History   First MD Initiated Contact with Patient 11/01/14 1738     Chief Complaint  Patient presents with  . Weakness    post-dialysis     (Consider location/radiation/quality/duration/timing/severity/associated sxs/prior Treatment) HPI Comments: Patient presents to the emergency department for evaluation of weakness. Patient reports that he had a full dialysis session today. He felt slightly weak after the dialysis, but was able to drive himself home. He then started to notice increasing weakness, predominantly right-sided weakness. He also has a right-sided headache. There is no neck pain or stiffness. He does not have a fever.  Patient is a 64 y.o. male presenting with weakness.  Weakness Associated symptoms include headaches.    Past Medical History  Diagnosis Date  . Coronary atherosclerosis of native coronary artery   . Other malaise and fatigue   . Chest pain, unspecified   . Other dyspnea and respiratory abnormality   . Personal history of unspecified circulatory disease   . Dysphagia, unspecified(787.20)   . Internal hemorrhoids without mention of complication   . Hypertrophy of prostate with urinary obstruction and other lower urinary tract symptoms (LUTS)   . Nausea alone   . Gastroparesis   . Backache, unspecified   . Posttraumatic stress disorder   . Hemorrhage of rectum and anus   . Allergy   . Diabetes mellitus 2004  . CHF (congestive heart failure)   . Hyperlipidemia   . Arthritis   . Dizziness   . Headache(784.0)   . Obesity   . Chills   . Complication of anesthesia     01/2011 could not eat,, hospt x2, was placed on hd and cleared up  . Myocardial infarction 2002  . Stroke     2007  . Unspecified essential hypertension     hx htn   . Chronic kidney disease, stage III (moderate)     HD T- TH-SAT  . Sleep apnea     USES CPAP   . Angina     LAST HAD CP 2 MO AGO  DR W[facility] [place] [facility]  . INTERNAL  HEMORRHOIDS 10/16/2008    Qualifier: Diagnosis of  By: Nolon Rod CMA Deborra Medina), Shirlean Mylar     Past Surgical History  Procedure Laterality Date  . Hemorrhoid surgery    . Anal fissurectomy    . Av fistula placement    . Revision of fistula      renal failure  . Cardiac catheterization      2005 DR BRODIE  . Ventral hernia repair  07/01/2011    Procedure: HERNIA REPAIR VENTRAL ADULT;  Surgeon: Joyice Faster. Cornett, MD;  Location: [facility];  Service: General;  Laterality: N/A;   Family History  Problem Relation Age of Onset  . Diabetes Sister   . Heart disease Sister   . Diabetes Maternal Aunt   . Diabetes Maternal Uncle   . Diabetes Paternal Aunt   . Diabetes Paternal Uncle   . Heart disease    . Heart disease Father   . Renal Disease Father   . Heart disease Brother   . Dementia Mother    History  Substance Use Topics  . Smoking status: Never Smoker   . Smokeless tobacco: Never Used  . Alcohol Use: No    Review of Systems  Neurological: Positive for weakness and headaches.  All other systems reviewed and are negative.     Allergies  Review of patient's allergies indicates  no known allergies.  Home Medications   Prior to Admission medications   Medication Sig Start Date End Date Taking? Authorizing Provider  allopurinol (ZYLOPRIM) 100 MG tablet Take 100 mg by mouth 2 (two) times daily.    Yes Historical Provider, MD  amoxicillin (AMOXIL) 500 MG capsule Take 500 mg by mouth 3 (three) times daily. 10/29/14  Yes Historical Provider, MD  aspirin 81 MG tablet Take 81 mg by mouth at bedtime.    Yes Historical Provider, MD  atorvastatin (LIPITOR) 80 MG tablet Take 40 mg by mouth daily.   Yes Historical Provider, MD  calcium acetate (PHOSLO) 667 MG capsule Take 667 mg by mouth 3 (three) times daily with meals.   Yes Historical Provider, MD  cinacalcet (SENSIPAR) 60 MG tablet Take 60 mg by mouth every other day.   Yes Historical Provider, MD  clopidogrel (PLAVIX) 75 MG tablet Take 75 mg by  mouth daily.    Yes Historical Provider, MD  diazepam (VALIUM) 5 MG tablet Take 1 tablet (5 mg total) by mouth at bedtime. 05/16/14  Yes [name], MD  docusate sodium (COLACE) 100 MG capsule Take 100 mg by mouth daily.   Yes Historical Provider, MD  gabapentin (NEURONTIN) 300 MG capsule Take 1 capsule (300 mg total) by mouth 2 (two) times daily. 06/25/14  Yes [name], MD  insulin glargine (LANTUS) 100 UNIT/ML injection Inject 90 Units into the skin at bedtime.     Yes Historical Provider, MD  insulin lispro protamine-lispro (HUMALOG 75/25 MIX) (75-25) 100 UNIT/ML SUSP injection Inject 50 Units into the skin 2 (two) times daily with a meal. Patient taking differently: Inject 15 Units into the skin 2 (two) times daily with a meal.  07/21/13  Yes Ricard Dillon, MD  metoprolol succinate (TOPROL-XL) 25 MG 24 hr tablet Take 12.5 mg by mouth daily.   Yes Historical Provider, MD  multivitamin (RENA-VIT) TABS tablet Take 1 tablet by mouth every morning.    Yes Historical Provider, MD  omeprazole (PRILOSEC) 20 MG capsule Take 20 mg by mouth daily.   Yes Historical Provider, MD  solifenacin (VESICARE) 5 MG tablet Take 5 mg by mouth every morning.    Yes Historical Provider, MD  tadalafil (CIALIS) 5 MG tablet Take 5 mg by mouth every evening.   Yes Historical Provider, MD  Tamsulosin HCl (FLOMAX) 0.4 MG CAPS Take 0.4 mg by mouth every morning.    Yes Historical Provider, MD  topiramate (TOPAMAX) 100 MG tablet TAKE 1 TABLET (100 MG TOTAL) BY MOUTH 2 (TWO) TIMES DAILY. 08/22/14  Yes [name], MD  traMADol (ULTRAM) 50 MG tablet Take 1 tablet (50 mg total) by mouth every 8 (eight) hours as needed. For pain. Take everyday per patient Patient taking differently: Take 50 mg by mouth every 8 (eight) hours as needed (takes with amoxicillin). For pain. Take everyday per patient 07/25/14  Yes [name], MD  ACCU-CHEK [facility] LANCETS lancets Use three times daily 11/16/13   Ricard Dillon, MD   Blood Glucose Monitoring Suppl (ACCU-CHEK AVIVA) device Use as instructed 11/16/13 11/16/14  Ricard Dillon, MD  glucose blood (ACCU-CHEK AVIVA PLUS) test strip Test three times daily 11/16/13   Ricard Dillon, MD   BP 148/85 mmHg  Pulse 68  Temp(Src) 98.5 F (36.9 C) (Oral)  Resp 13  Ht 5' 10.5" (1.791 m)  Wt 231 lb (104.781 kg)  BMI 32.67 kg/m2  SpO2 98% Physical Exam  Constitutional:  He is oriented to person, place, and time. He appears well-developed and well-nourished. No distress.  HENT:  Head: Normocephalic and atraumatic.  Right Ear: Hearing normal.  Left Ear: Hearing normal.  Nose: Nose normal.  Mouth/Throat: Oropharynx is clear and moist and mucous membranes are normal.  Eyes: Conjunctivae and EOM are normal. Pupils are equal, round, and reactive to light.  Neck: Normal range of motion. Neck supple.  Cardiovascular: Regular rhythm, S1 normal and S2 normal.  Exam reveals no gallop and no friction rub.   No murmur heard. Pulmonary/Chest: Effort normal and breath sounds normal. No respiratory distress. He exhibits no tenderness.  Abdominal: Soft. Normal appearance and bowel sounds are normal. There is no hepatosplenomegaly. There is no tenderness. There is no rebound, no guarding, no tenderness at McBurney's point and negative Murphy's sign. No hernia.  Musculoskeletal: Normal range of motion.  Neurological: He is alert and oriented to person, place, and time. He has normal strength. No cranial nerve deficit or sensory deficit. Coordination normal. GCS eye subscore is 4. GCS verbal subscore is 5. GCS motor subscore is 6.  No facial droop noted  Grip strength diminished on right compared to left  No significant decreased sensation noted  Skin: Skin is warm, dry and intact. No rash noted. No cyanosis.  Psychiatric: He has a normal mood and affect. His speech is normal and behavior is normal. Thought content normal.  Nursing note and vitals reviewed.   ED Course  Procedures  (including critical care time) Labs Review Labs Reviewed  URINE RAPID DRUG SCREEN (HOSP PERFORMED) NOT AT [facility] - Abnormal; Notable for the following:    Benzodiazepines POSITIVE (*)    All other components within normal limits  URINALYSIS, ROUTINE W REFLEX MICROSCOPIC (NOT AT [facility]) - Abnormal; Notable for the following:    Color, Urine AMBER (*)    APPearance CLOUDY (*)    Bilirubin Urine SMALL (*)    Protein, ur 100 (*)    Leukocytes, UA SMALL (*)    All other components within normal limits  URINE MICROSCOPIC-ADD ON - Abnormal; Notable for the following:    Casts HYALINE CASTS (*)    All other components within normal limits  I-STAT CHEM 8, ED - Abnormal; Notable for the following:    Chloride 95 (*)    Creatinine, Ser 5.50 (*)    Glucose, Bld 122 (*)    Calcium, Ion 0.91 (*)    All other components within normal limits  ETHANOL  I-STAT TROPOININ, ED  I-STAT TROPOININ, ED  I-STAT CHEM 8, ED    Imaging Review Ct Head Wo Contrast  11/01/2014   CLINICAL DATA:  Code stroke. RIGHT-sided weakness. Last seen normal at 1300 hr.  EXAM: CT HEAD WITHOUT CONTRAST  TECHNIQUE: Contiguous axial images were obtained from the base of the skull through the vertex without intravenous contrast.  COMPARISON:  CT head 04/2012.  FINDINGS: Generalized atrophy premature for age. Chronic microvascular ischemic change of a mild-to-moderate degree.  There is asymmetric hypoattenuation of the posterior limb internal capsule on the LEFT, not present previously. Other areas of suspected acute infarction affect the LEFT frontal operculum and LEFT superior temporal gyrus. Slight hyperattenuation of a sylvian fissure LEFT MCA M2 branch on image 14. No definite large vessel occlusion.  Calvarium is intact. BILATERAL maxillary sinus retention cysts. No sinus air-fluid level.  IMPRESSION: Suspected areas of acute infarction affecting LEFT posterior limb internal capsule, LEFT frontal operculum, and LEFT superior temporal  lobe. No large  vessel occlusion but suspected hyper attenuation possibly representing thrombus of a LEFT MCA M2 branch.  No intracranial hemorrhage.  Atrophy and small vessel disease similar priors.  Critical Value/emergent results were called by telephone at the time of interpretation on 11/01/2014 at 6:05 pm to Dr. Doy Mince, who verbally acknowledged these results.   Electronically Signed   By: Rolla Flatten M.D.   On: 11/01/2014 18:12   Mr Brain Wo Contrast  11/01/2014   CLINICAL DATA:  64 year old male with right side head pain following dialysis today. New abnormal sensation on the right side of his body. Initial encounter.  EXAM: MRI HEAD WITHOUT CONTRAST  TECHNIQUE: Multiplanar, multiecho pulse sequences of the brain and surrounding structures were obtained without intravenous contrast.  COMPARISON:  Head CT without contrast 1734 hr today. Brain MRI 04/29/2012.  FINDINGS: Cerebral volume is not significantly changed since 2013. Major intracranial vascular flow voids are stable. No restricted diffusion to suggest acute infarction. No midline shift, mass effect, evidence of mass lesion, ventriculomegaly, extra-axial collection or acute intracranial hemorrhage. Cervicomedullary junction and pituitary are within normal limits. Negative visualized cervical spine.  Stable minimal to mild for age white matter T2 and FLAIR hyperintensity. No supratentorial encephalomalacia or chronic blood products identified in the brain. Deep gray matter nuclei and brainstem within normal limits. There are small chronic infarcts in the left cerebellum which are unchanged.  Visible internal auditory structures appear normal. Mastoids are clear. Chronic maxillary sinus mucosal thickening, mucous retention cysts. Orbits soft tissues appear normal. Visualized scalp soft tissues are within normal limits. Bone marrow signal is within normal limits.  IMPRESSION: 1.  No acute intracranial abnormality. 2. Stable chronic left cerebellar  ischemia.   Electronically Signed   By: Genevie Ann M.D.   On: 11/01/2014 19:41     EKG Interpretation   Date/Time:  Thursday November 01 2014 17:22:45 EDT Ventricular Rate:  85 PR Interval:  180 QRS Duration: 82 QT Interval:  386 QTC Calculation: 459 R Axis:   -49 Text Interpretation:  Normal sinus rhythm Left axis deviation Low voltage  QRS Cannot rule out Anterior infarct , age undetermined T wave  abnormality, consider lateral ischemia Abnormal ECG No significant change  since last tracing Confirmed by POLLINA  MD, CHRISTOPHER [phone]) on  11/01/2014 5:39:34 PM      MDM   Final diagnoses:  Stroke   generalized weakness  Patient presents to the ER for evaluation of headache, generalized weakness, specifically right-sided weakness. Patient reports symptoms began at dialysis it worsened after he got home. Examination revealed some mild weakness of the right upper extremity compared to the left. Code stroke was initiated. CT showed areas of hypodensity that are concerning for acute stroke. Patient underwent MRI which showed no acute stroke.  Case was discussed with Dr. Armida Sans, on for neurology. Dr. Doy Mince had initially performed the consultation recommended admission based on the CT findings. Dr. Armida Sans, however, confirm that with the negative MRI, patient was safe for discharge, follow-up with PCP. I discussed with the patient, he would prefer to be discharged.    Orpah Greek, MD 11/01/14 2118

## 2014-11-01 NOTE — Code Documentation (Signed)
64 year old male presents to [facility] via pvt vehicle with c/o new right side weakness and headache.  States he noticed some right side weakness and decreased sensation at 1245 today after HD.  He drove himself home - symptoms became worse and he presents to the ED.  Code stroke was called in triage.  NIHSS 5.  LSW 1245 today.  See flow sheet for details.  Dr. Doy Mince present.  No acute treatment -outside tPA window.  Handoff to [employer].

## 2014-11-01 NOTE — Telephone Encounter (Signed)
Patient Name: [name] DOB: 07-03-1950 Initial Comment Caller just left Dialysis , now having pain in arm, unusual pain. Has pain after wards but now more so. Nurse Assessment Nurse: Marcelline Deist, RN, Lynda Date/Time (Eastern Time): 11/01/2014 1:33:51 PM Confirm and document reason for call. If symptomatic, describe symptoms. ---Caller just left Dialysis - hemodialysis. Now, having pain in both arms & into his right leg, unusual pain. Has pain more so on the right arm. Has the patient traveled out of the country within the last 30 days? ---Not Applicable Does the patient require triage? ---Yes Related visit to physician within the last 2 weeks? ---Yes Does the PT have any chronic conditions? (i.e. diabetes, asthma, etc.) ---Yes List chronic conditions. ---on dialysis, stroke hx, heart attacks in past/stent Guidelines Guideline Title Affirmed Question Affirmed Notes Arm Pain [1] Age > 40 AND [2] no obvious cause AND [3] pain even when not moving the arm (Exception: pain is clearly made worse by moving arm or bending neck) Final Disposition User Go to ED Now Marcelline Deist, RN, Lynda Comments Caller describes pain as if he has a headache in his arm. States last week, they had to transport him to the ER following dialysis because they pulled off the wrong amount of fluid & took him off too soon/quickly.

## 2014-11-01 NOTE — ED Notes (Signed)
Dr Doy Mince reading CT

## 2014-11-01 NOTE — ED Notes (Signed)
Pt to MRI at this time.

## 2014-11-01 NOTE — Telephone Encounter (Signed)
Please advise if would like to use home advice for relief or request appointment to be seen.

## 2014-11-01 NOTE — Telephone Encounter (Signed)
This was not brought to my attention before the end of the work day as to needing disposition. Please make sure this information gets to me before all clinical staff is gone if possible. I had asked for it to be triaged at lunch time but did not know of the follow up.   I tried to call patient but the first number did not ring and the second number did not allow message to be left. Reviewed chart at that point and appears patient went to ED

## 2014-11-01 NOTE — Telephone Encounter (Signed)
See below

## 2014-12-19 DIAGNOSIS — Z01818 Encounter for other preprocedural examination: Secondary | ICD-10-CM | POA: Diagnosis not present

## 2014-12-19 DIAGNOSIS — I77819 Aortic ectasia, unspecified site: Secondary | ICD-10-CM | POA: Diagnosis not present

## 2014-12-19 DIAGNOSIS — N2889 Other specified disorders of kidney and ureter: Secondary | ICD-10-CM | POA: Diagnosis not present

## 2014-12-19 DIAGNOSIS — N261 Atrophy of kidney (terminal): Secondary | ICD-10-CM | POA: Diagnosis not present

## 2014-12-19 DIAGNOSIS — I517 Cardiomegaly: Secondary | ICD-10-CM | POA: Diagnosis not present

## 2014-12-19 DIAGNOSIS — Z992 Dependence on renal dialysis: Secondary | ICD-10-CM | POA: Diagnosis not present

## 2014-12-19 DIAGNOSIS — I358 Other nonrheumatic aortic valve disorders: Secondary | ICD-10-CM | POA: Diagnosis not present

## 2014-12-19 DIAGNOSIS — N186 End stage renal disease: Secondary | ICD-10-CM | POA: Diagnosis not present

## 2015-01-15 ENCOUNTER — Inpatient Hospital Stay (HOSPITAL_COMMUNITY)
Admission: EM | Admit: 2015-01-15 | Discharge: 2015-01-16 | DRG: 312 | Disposition: A | Discharge status: Home / Self Care | Payer: Medicare Other | Attending: Internal Medicine | Admitting: Internal Medicine

## 2015-01-15 ENCOUNTER — Encounter (HOSPITAL_COMMUNITY): Payer: Self-pay | Admitting: Emergency Medicine

## 2015-01-15 DIAGNOSIS — Z833 Family history of diabetes mellitus: Secondary | ICD-10-CM | POA: Diagnosis not present

## 2015-01-15 DIAGNOSIS — E669 Obesity, unspecified: Secondary | ICD-10-CM | POA: Diagnosis not present

## 2015-01-15 DIAGNOSIS — G4733 Obstructive sleep apnea (adult) (pediatric): Secondary | ICD-10-CM | POA: Diagnosis present

## 2015-01-15 DIAGNOSIS — I2510 Atherosclerotic heart disease of native coronary artery without angina pectoris: Secondary | ICD-10-CM | POA: Diagnosis not present

## 2015-01-15 DIAGNOSIS — N138 Other obstructive and reflux uropathy: Secondary | ICD-10-CM | POA: Diagnosis present

## 2015-01-15 DIAGNOSIS — R531 Weakness: Secondary | ICD-10-CM | POA: Diagnosis not present

## 2015-01-15 DIAGNOSIS — E785 Hyperlipidemia, unspecified: Secondary | ICD-10-CM | POA: Diagnosis present

## 2015-01-15 DIAGNOSIS — I252 Old myocardial infarction: Secondary | ICD-10-CM | POA: Diagnosis not present

## 2015-01-15 DIAGNOSIS — E1142 Type 2 diabetes mellitus with diabetic polyneuropathy: Secondary | ICD-10-CM | POA: Diagnosis not present

## 2015-01-15 DIAGNOSIS — M1990 Unspecified osteoarthritis, unspecified site: Secondary | ICD-10-CM | POA: Diagnosis present

## 2015-01-15 DIAGNOSIS — Z794 Long term (current) use of insulin: Secondary | ICD-10-CM

## 2015-01-15 DIAGNOSIS — I10 Essential (primary) hypertension: Secondary | ICD-10-CM | POA: Diagnosis not present

## 2015-01-15 DIAGNOSIS — Z8673 Personal history of transient ischemic attack (TIA), and cerebral infarction without residual deficits: Secondary | ICD-10-CM | POA: Diagnosis not present

## 2015-01-15 DIAGNOSIS — K3184 Gastroparesis: Secondary | ICD-10-CM | POA: Diagnosis present

## 2015-01-15 DIAGNOSIS — Z955 Presence of coronary angioplasty implant and graft: Secondary | ICD-10-CM

## 2015-01-15 DIAGNOSIS — Y92009 Unspecified place in unspecified non-institutional (private) residence as the place of occurrence of the external cause: Secondary | ICD-10-CM

## 2015-01-15 DIAGNOSIS — E1122 Type 2 diabetes mellitus with diabetic chronic kidney disease: Secondary | ICD-10-CM | POA: Diagnosis present

## 2015-01-15 DIAGNOSIS — Z8249 Family history of ischemic heart disease and other diseases of the circulatory system: Secondary | ICD-10-CM | POA: Diagnosis not present

## 2015-01-15 DIAGNOSIS — N186 End stage renal disease: Secondary | ICD-10-CM | POA: Diagnosis present

## 2015-01-15 DIAGNOSIS — E1143 Type 2 diabetes mellitus with diabetic autonomic (poly)neuropathy: Secondary | ICD-10-CM | POA: Diagnosis not present

## 2015-01-15 DIAGNOSIS — E1140 Type 2 diabetes mellitus with diabetic neuropathy, unspecified: Secondary | ICD-10-CM | POA: Diagnosis present

## 2015-01-15 DIAGNOSIS — Z7982 Long term (current) use of aspirin: Secondary | ICD-10-CM

## 2015-01-15 DIAGNOSIS — I120 Hypertensive chronic kidney disease with stage 5 chronic kidney disease or end stage renal disease: Secondary | ICD-10-CM | POA: Diagnosis not present

## 2015-01-15 DIAGNOSIS — F4310 Post-traumatic stress disorder, unspecified: Secondary | ICD-10-CM | POA: Diagnosis present

## 2015-01-15 DIAGNOSIS — Z6832 Body mass index (BMI) 32.0-32.9, adult: Secondary | ICD-10-CM | POA: Diagnosis not present

## 2015-01-15 DIAGNOSIS — R1310 Dysphagia, unspecified: Secondary | ICD-10-CM | POA: Diagnosis not present

## 2015-01-15 DIAGNOSIS — E0843 Diabetes mellitus due to underlying condition with diabetic autonomic (poly)neuropathy: Secondary | ICD-10-CM

## 2015-01-15 DIAGNOSIS — I951 Orthostatic hypotension: Secondary | ICD-10-CM | POA: Diagnosis present

## 2015-01-15 DIAGNOSIS — N401 Enlarged prostate with lower urinary tract symptoms: Secondary | ICD-10-CM | POA: Diagnosis not present

## 2015-01-15 DIAGNOSIS — Z7902 Long term (current) use of antithrombotics/antiplatelets: Secondary | ICD-10-CM

## 2015-01-15 DIAGNOSIS — I509 Heart failure, unspecified: Secondary | ICD-10-CM | POA: Diagnosis present

## 2015-01-15 DIAGNOSIS — Z79899 Other long term (current) drug therapy: Secondary | ICD-10-CM

## 2015-01-15 DIAGNOSIS — M109 Gout, unspecified: Secondary | ICD-10-CM | POA: Diagnosis not present

## 2015-01-15 DIAGNOSIS — W19XXXA Unspecified fall, initial encounter: Secondary | ICD-10-CM | POA: Diagnosis present

## 2015-01-15 DIAGNOSIS — E119 Type 2 diabetes mellitus without complications: Secondary | ICD-10-CM

## 2015-01-15 DIAGNOSIS — Z992 Dependence on renal dialysis: Secondary | ICD-10-CM

## 2015-01-15 DIAGNOSIS — Z79891 Long term (current) use of opiate analgesic: Secondary | ICD-10-CM | POA: Diagnosis not present

## 2015-01-15 DIAGNOSIS — I712 Thoracic aortic aneurysm, without rupture, unspecified: Secondary | ICD-10-CM | POA: Diagnosis present

## 2015-01-15 DIAGNOSIS — R079 Chest pain, unspecified: Secondary | ICD-10-CM

## 2015-01-15 DIAGNOSIS — I77810 Thoracic aortic ectasia: Secondary | ICD-10-CM | POA: Diagnosis present

## 2015-01-15 DIAGNOSIS — R404 Transient alteration of awareness: Secondary | ICD-10-CM | POA: Diagnosis not present

## 2015-01-15 LAB — GLUCOSE, CAPILLARY
Glucose-Capillary: 156 mg/dL — ABNORMAL HIGH (ref 65–99)
Glucose-Capillary: 189 mg/dL — ABNORMAL HIGH (ref 65–99)

## 2015-01-15 LAB — CBC
HEMATOCRIT: 41.9 % (ref 39.0–52.0)
Hemoglobin: 13.9 g/dL (ref 13.0–17.0)
MCH: 29.6 pg (ref 26.0–34.0)
MCHC: 33.2 g/dL (ref 30.0–36.0)
MCV: 89.1 fL (ref 78.0–100.0)
Platelets: 266 10*3/uL (ref 150–400)
RBC: 4.7 MIL/uL (ref 4.22–5.81)
RDW: 15.2 % (ref 11.5–15.5)
WBC: 5.3 10*3/uL (ref 4.0–10.5)

## 2015-01-15 LAB — URINE MICROSCOPIC-ADD ON

## 2015-01-15 LAB — BASIC METABOLIC PANEL
Anion gap: 16 — ABNORMAL HIGH (ref 5–15)
BUN: 26 mg/dL — AB (ref 6–20)
CHLORIDE: 93 mmol/L — AB (ref 101–111)
CO2: 25 mmol/L (ref 22–32)
Calcium: 10.3 mg/dL (ref 8.9–10.3)
Creatinine, Ser: 6.43 mg/dL — ABNORMAL HIGH (ref 0.61–1.24)
GFR calc Af Amer: 9 mL/min — ABNORMAL LOW (ref >60)
GFR calc non Af Amer: 8 mL/min — ABNORMAL LOW (ref >60)
GLUCOSE: 96 mg/dL (ref 65–99)
Potassium: 4.4 mmol/L (ref 3.5–5.1)
Sodium: 134 mmol/L — ABNORMAL LOW (ref 135–145)

## 2015-01-15 LAB — URINALYSIS, ROUTINE W REFLEX MICROSCOPIC
BILIRUBIN URINE: NEGATIVE
Glucose, UA: NEGATIVE mg/dL
Ketones, ur: NEGATIVE mg/dL
NITRITE: NEGATIVE
PH: 5.5 (ref 5.0–8.0)
Protein, ur: 30 mg/dL — AB
SPECIFIC GRAVITY, URINE: 1.017 (ref 1.005–1.030)
Urobilinogen, UA: 0.2 mg/dL (ref 0.0–1.0)

## 2015-01-15 LAB — I-STAT TROPONIN, ED: TROPONIN I, POC: 0.04 ng/mL (ref 0.00–0.08)

## 2015-01-15 LAB — CBG MONITORING, ED: Glucose-Capillary: 92 mg/dL (ref 65–99)

## 2015-01-15 MED ORDER — TOPIRAMATE 100 MG PO TABS
100.0000 mg | ORAL_TABLET | Freq: Two times a day (BID) | ORAL | Status: DC
  Administered 2015-01-16: 100 mg via ORAL
  Filled 2015-01-15 (×2): qty 1

## 2015-01-15 MED ORDER — PANTOPRAZOLE SODIUM 40 MG PO TBEC
40.0000 mg | DELAYED_RELEASE_TABLET | Freq: Every day | ORAL | Status: DC
  Administered 2015-01-15 – 2015-01-16 (×2): 40 mg via ORAL
  Filled 2015-01-15 (×2): qty 1

## 2015-01-15 MED ORDER — SODIUM CHLORIDE 0.9 % IJ SOLN: 3.0000 mL | Freq: Two times a day (BID) | INTRAMUSCULAR | Status: DC

## 2015-01-15 MED ORDER — ASPIRIN EC 81 MG PO TBEC
81.0000 mg | DELAYED_RELEASE_TABLET | Freq: Every day | ORAL | Status: DC
  Filled 2015-01-15: qty 1

## 2015-01-15 MED ORDER — DIAZEPAM 5 MG PO TABS
5.0000 mg | ORAL_TABLET | Freq: Every day | ORAL | Status: DC
  Filled 2015-01-15: qty 1

## 2015-01-15 MED ORDER — DARIFENACIN HYDROBROMIDE ER 7.5 MG PO TB24
7.5000 mg | ORAL_TABLET | Freq: Every morning | ORAL | Status: DC
  Administered 2015-01-16: 7.5 mg via ORAL
  Filled 2015-01-15: qty 1

## 2015-01-15 MED ORDER — CALCIUM ACETATE (PHOS BINDER) 667 MG PO CAPS
2668.0000 mg | ORAL_CAPSULE | Freq: Three times a day (TID) | ORAL | Status: DC
  Administered 2015-01-15 – 2015-01-16 (×2): 2668 mg via ORAL
  Filled 2015-01-15 (×2): qty 4

## 2015-01-15 MED ORDER — ATORVASTATIN CALCIUM 40 MG PO TABS
40.0000 mg | ORAL_TABLET | Freq: Every day | ORAL | Status: DC
  Administered 2015-01-16: 40 mg via ORAL
  Filled 2015-01-15 (×2): qty 1

## 2015-01-15 MED ORDER — SODIUM CHLORIDE 0.9 % IV SOLN
INTRAVENOUS | Status: AC
  Administered 2015-01-15: 15:00:00 via INTRAVENOUS

## 2015-01-15 MED ORDER — SODIUM CHLORIDE 0.9 % IV SOLN: INTRAVENOUS | Status: DC

## 2015-01-15 MED ORDER — TRAMADOL HCL 50 MG PO TABS
50.0000 mg | ORAL_TABLET | Freq: Two times a day (BID) | ORAL | Status: DC | PRN
  Administered 2015-01-15 – 2015-01-16 (×2): 50 mg via ORAL
  Filled 2015-01-15 (×2): qty 1

## 2015-01-15 MED ORDER — CLOPIDOGREL BISULFATE 75 MG PO TABS
75.0000 mg | ORAL_TABLET | Freq: Every day | ORAL | Status: DC
  Administered 2015-01-16: 75 mg via ORAL
  Filled 2015-01-15 (×2): qty 1

## 2015-01-15 MED ORDER — ACETAMINOPHEN 650 MG RE SUPP: 650.0000 mg | Freq: Four times a day (QID) | RECTAL | Status: DC | PRN

## 2015-01-15 MED ORDER — ALLOPURINOL 100 MG PO TABS
100.0000 mg | ORAL_TABLET | Freq: Two times a day (BID) | ORAL | Status: DC
  Administered 2015-01-16: 100 mg via ORAL
  Filled 2015-01-15 (×2): qty 1

## 2015-01-15 MED ORDER — GABAPENTIN 300 MG PO CAPS
300.0000 mg | ORAL_CAPSULE | Freq: Two times a day (BID) | ORAL | Status: DC
  Administered 2015-01-16: 300 mg via ORAL
  Filled 2015-01-15 (×2): qty 1

## 2015-01-15 MED ORDER — HEPARIN SODIUM (PORCINE) 5000 UNIT/ML IJ SOLN
5000.0000 [IU] | Freq: Three times a day (TID) | INTRAMUSCULAR | Status: DC
  Administered 2015-01-15 – 2015-01-16 (×2): 5000 [IU] via SUBCUTANEOUS
  Filled 2015-01-15: qty 1

## 2015-01-15 MED ORDER — CINACALCET HCL 30 MG PO TABS
60.0000 mg | ORAL_TABLET | Freq: Every day | ORAL | Status: DC
  Administered 2015-01-15: 60 mg via ORAL
  Filled 2015-01-15: qty 2

## 2015-01-15 MED ORDER — ACETAMINOPHEN 325 MG PO TABS
650.0000 mg | ORAL_TABLET | Freq: Four times a day (QID) | ORAL | Status: DC | PRN
  Filled 2015-01-15: qty 2

## 2015-01-15 MED ORDER — INSULIN ASPART 100 UNIT/ML ~~LOC~~ SOLN: 0.0000 [IU] | Freq: Every day | SUBCUTANEOUS | Status: DC

## 2015-01-15 MED ORDER — RENA-VITE PO TABS
1.0000 | ORAL_TABLET | Freq: Every morning | ORAL | Status: DC
  Filled 2015-01-15: qty 1

## 2015-01-15 MED ORDER — TAMSULOSIN HCL 0.4 MG PO CAPS: 0.4000 mg | ORAL_CAPSULE | Freq: Every morning | ORAL | Status: DC

## 2015-01-15 MED ORDER — INSULIN ASPART 100 UNIT/ML ~~LOC~~ SOLN: 0.0000 [IU] | Freq: Three times a day (TID) | SUBCUTANEOUS | Status: DC

## 2015-01-15 MED ORDER — INSULIN GLARGINE 100 UNIT/ML ~~LOC~~ SOLN
90.0000 [IU] | Freq: Every day | SUBCUTANEOUS | Status: DC
  Administered 2015-01-15: 90 [IU] via SUBCUTANEOUS
  Filled 2015-01-15 (×2): qty 0.9

## 2015-01-15 NOTE — ED Notes (Signed)
MD at bedside. 

## 2015-01-15 NOTE — ED Provider Notes (Signed)
CSN: [id number]     Arrival date & time 01/15/15  1236 History   First MD Initiated Contact with Patient 01/15/15 1250     Chief Complaint  Patient presents with  . Hypotension     (Consider location/radiation/quality/duration/timing/severity/associated sxs/prior Treatment) HPI Patient states his blood pressure got low at dialysis. He did have a full treatment and he reports that they took off quite a bit of volume today. He states that he was very weak at that time and that his blood pressure didn't come above 80 systolic for a while. He notes that this morning before going to dialysis he fell in his driveway. He states that he has a problem with falling due to pre-existing neurologic weakness. 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 Thus he fell forward and did a "five point" roll landing on his shoulders and back. He was able to protect his head and never struck his head. He denies his headache. He reports he does have pain across the backs of his shoulders and upper back as well as his lower back. He does not have any additional shortness of breath or chest pain. He reports he has chronic problems with pain in his lower back and radiating into his legs for which she intermittently gets lumbar injections. Past Medical History  Diagnosis Date  . Coronary atherosclerosis of native coronary artery   . Other malaise and fatigue   . Chest pain, unspecified   . Other dyspnea and respiratory abnormality   . Personal history of unspecified circulatory disease   . Dysphagia, unspecified(787.20)   . Internal hemorrhoids without mention of complication   . Hypertrophy of prostate with urinary obstruction and other lower urinary tract symptoms (LUTS)   . Nausea alone   . Gastroparesis   . Backache, unspecified   . Posttraumatic stress disorder   . Hemorrhage of rectum and anus   . Allergy   . Diabetes mellitus 2004  . CHF (congestive heart failure)    . Hyperlipidemia   . Arthritis   . Dizziness   . Headache(784.0)   . Obesity   . Chills   . Complication of anesthesia     01/2011 could not eat,, hospt x2, was placed on hd and cleared up  . Myocardial infarction 2002  . Stroke     2007  . Unspecified essential hypertension     hx htn   . Chronic kidney disease, stage III (moderate)     HD T- TH-SAT  . Sleep apnea     USES CPAP   . Angina     LAST HAD CP 2 MO AGO  DR WALL [place]  . INTERNAL HEMORRHOIDS 10/16/2008    Qualifier: Diagnosis of  By: Nolon Rod CMA Deborra Medina), Shirlean Mylar     Past Surgical History  Procedure Laterality Date  . Hemorrhoid surgery    . Anal fissurectomy    . Av fistula placement    . Revision of fistula      renal failure  . Cardiac catheterization      2005 DR BRODIE  . Ventral hernia repair  07/01/2011    Procedure: HERNIA REPAIR VENTRAL ADULT;  Surgeon: Joyice Faster. Cornett, MD;  Location: [place] [facility];  Service: General;  Laterality: N/A;   Family History  Problem Relation Age of Onset  . Diabetes Sister   . Heart disease Sister   . Diabetes Maternal Aunt   . Diabetes Maternal Uncle   .  Diabetes Paternal Aunt   . Diabetes Paternal Uncle   . Heart disease    . Heart disease Father   . Renal Disease Father   . Heart disease Brother   . Dementia Mother    Social History  Substance Use Topics  . Smoking status: Never Smoker   . Smokeless tobacco: Never Used  . Alcohol Use: No    Review of Systems  10 Systems reviewed and are negative for acute change except as noted in the HPI.   Allergies  Review of patient's allergies indicates no known allergies.  Home Medications   Prior to Admission medications   Medication Sig Start Date End Date Taking? Authorizing Provider  ACCU-CHEK SOFTCLIX LANCETS lancets Use three times daily 11/16/13   Ricard Dillon, MD  allopurinol (ZYLOPRIM) 100 MG tablet Take 100 mg by mouth 2 (two) times daily.     Historical Provider, MD  amoxicillin (AMOXIL) 500 MG capsule  Take 500 mg by mouth 3 (three) times daily. 10/29/14   Historical Provider, MD  aspirin 81 MG tablet Take 81 mg by mouth at bedtime.     Historical Provider, MD  atorvastatin (LIPITOR) 80 MG tablet Take 40 mg by mouth daily.    Historical Provider, MD  calcium acetate (PHOSLO) 667 MG capsule Take 667 mg by mouth 3 (three) times daily with meals.    Historical Provider, MD  cinacalcet (SENSIPAR) 60 MG tablet Take 60 mg by mouth every other day.    Historical Provider, MD  clopidogrel (PLAVIX) 75 MG tablet Take 75 mg by mouth daily.     Historical Provider, MD  diazepam (VALIUM) 5 MG tablet Take 1 tablet (5 mg total) by mouth at bedtime. 05/16/14   [name], MD  docusate sodium (COLACE) 100 MG capsule Take 100 mg by mouth daily.    Historical Provider, MD  gabapentin (NEURONTIN) 300 MG capsule Take 1 capsule (300 mg total) by mouth 2 (two) times daily. 06/25/14   [name], MD  glucose blood (ACCU-CHEK AVIVA PLUS) test strip Test three times daily 11/16/13   Ricard Dillon, MD  insulin glargine (LANTUS) 100 UNIT/ML injection Inject 90 Units into the skin at bedtime.      Historical Provider, MD  insulin lispro protamine-lispro (HUMALOG 75/25 MIX) (75-25) 100 UNIT/ML SUSP injection Inject 50 Units into the skin 2 (two) times daily with a meal. Patient taking differently: Inject 15 Units into the skin 2 (two) times daily with a meal.  07/21/13   Ricard Dillon, MD  metoprolol succinate (TOPROL-XL) 25 MG 24 hr tablet Take 12.5 mg by mouth daily.    Historical Provider, MD  multivitamin (RENA-VIT) TABS tablet Take 1 tablet by mouth every morning.     Historical Provider, MD  omeprazole (PRILOSEC) 20 MG capsule Take 20 mg by mouth daily.    Historical Provider, MD  solifenacin (VESICARE) 5 MG tablet Take 5 mg by mouth every morning.     Historical Provider, MD  tadalafil (CIALIS) 5 MG tablet Take 5 mg by mouth every evening.    Historical Provider, MD  Tamsulosin HCl (FLOMAX) 0.4 MG CAPS Take 0.4  mg by mouth every morning.     Historical Provider, MD  topiramate (TOPAMAX) 100 MG tablet TAKE 1 TABLET (100 MG TOTAL) BY MOUTH 2 (TWO) TIMES DAILY. 08/22/14   [name], MD  traMADol (ULTRAM) 50 MG tablet Take 1 tablet (50 mg total) by mouth every 8 (eight) hours as  needed. For pain. Take everyday per patient Patient taking differently: Take 50 mg by mouth every 8 (eight) hours as needed (takes with amoxicillin). For pain. Take everyday per patient 07/25/14   [name], MD   BP 104/69 mmHg  Pulse 77  Resp 14  Ht 5\' 10"  (1.778 m)  Wt 229 lb 4.4 oz (103.998 kg)  BMI 32.90 kg/m2  SpO2 98% Physical Exam  Constitutional: He is oriented to person, place, and time. He appears well-developed and well-nourished.  HENT:  Head: Normocephalic and atraumatic.  Eyes: EOM are normal. Pupils are equal, round, and reactive to light.  Neck: Neck supple.  Cardiovascular: Normal rate, regular rhythm, normal heart sounds and intact distal pulses.   Pulmonary/Chest: Effort normal and breath sounds normal.  Abdominal: Soft. Bowel sounds are normal. He exhibits no distension. There is no tenderness.  Musculoskeletal: Normal range of motion. He exhibits no edema.  Object initially there are no contusions or abrasions to the back. He does endorse tenderness to palpation over the upper back diffusely. There is no localizing over the vertebral bodies. No abrasions or contusions to the knees or the elbows. Intact range of motion of both upper and lower extremities.  Neurological: He is alert and oriented to person, place, and time. He has normal strength. No cranial nerve deficit. He exhibits normal muscle tone. Coordination normal. GCS eye subscore is 4. GCS verbal subscore is 5. GCS motor subscore is 6.  Skin: Skin is warm, dry and intact.  Psychiatric: He has a normal mood and affect.    ED Course  Procedures (including critical care time) Labs Review Labs Reviewed  BASIC METABOLIC PANEL -  Abnormal; Notable for the following:    Sodium 134 (*)    Chloride 93 (*)    BUN 26 (*)    Creatinine, Ser 6.43 (*)    GFR calc non Af Amer 8 (*)    GFR calc Af Amer 9 (*)    Anion gap 16 (*)    All other components within normal limits  CBC  URINALYSIS, ROUTINE W REFLEX MICROSCOPIC (NOT AT [facility])  CBG MONITORING, ED  I-STAT TROPOININ, ED    Imaging Review No results found. I have personally reviewed and evaluated these images and lab results as part of my medical decision-making.   EKG Interpretation   Date/Time:  Tuesday January 15 2015 12:53:57 EDT Ventricular Rate:  80 PR Interval:  191 QRS Duration: 95 QT Interval:  376 QTC Calculation: 434 R Axis:   -34 Text Interpretation:  Sinus rhythm Abnormal R-wave progression, late  transition Inferior infarct, old Lateral leads are also involved no  significant change from old Confirmed by Johnney Killian, MD, Jeannie Done [phone]) on  01/15/2015 1:18:47 PM      MDM   Final diagnoses:  Orthostatic hypotension  ESRD (end stage renal disease)  Chest pain, unspecified chest pain type  Fall, initial encounter   Patient had a mechanical fall prior to going to dialysis this morning. He does report he was experiencing some thoracic pain after this fall. His fall did not precipitate his presentation to the emergency department. He went on to dialysis and there is no evident serious traumatic injury. He reports having been able to protect his head and rolled onto his shoulders and back. Objectively, there are no soft tissue injuries and patient has intact range of motion of extremities. After completing his treatment became very lethargic and hypotensive. Patient's systolic blood pressure remained in the 80s. He  was transported via EMS to the emergency department. Here the patient is appropriate and has a clear mental status. He does not give history since suggest infectious etiology or sepsis. Concern is for lethargy likely due to volume depletion  postdialysis and thoracic pain that is likely secondary to strain after the patient's fall, however due to patient's risk factors, consideration is given to possible cardiac related chest pain.    Charlesetta Shanks, MD 01/15/15 1447

## 2015-01-15 NOTE — H&P (Signed)
[facility] History and Physical                                                                                    Markcus Lazenby, is a 64 y.o. male  MRN: [id number]   DOB - 08/25/1950  Admit Date - 01/15/2015  Outpatient Primary MD for the patient is Garret Reddish, MD  Referring MD: Johnney Killian / ER   With History of -  Past Medical History  Diagnosis Date  . Coronary atherosclerosis of native coronary artery   . Other malaise and fatigue   . Chest pain, unspecified   . Other dyspnea and respiratory abnormality   . Personal history of unspecified circulatory disease   . Dysphagia, unspecified(787.20)   . Internal hemorrhoids without mention of complication   . Hypertrophy of prostate with urinary obstruction and other lower urinary tract symptoms (LUTS)   . Nausea alone   . Gastroparesis   . Backache, unspecified   . Posttraumatic stress disorder   . Hemorrhage of rectum and anus   . Allergy   . Diabetes mellitus 2004  . CHF (congestive heart failure)   . Hyperlipidemia   . Arthritis   . Dizziness   . Headache(784.0)   . Obesity   . Chills   . Complication of anesthesia     01/2011 could not eat,, hospt x2, was placed on hd and cleared up  . Myocardial infarction 2002  . Stroke     2007  . Unspecified essential hypertension     hx htn   . Chronic kidney disease, stage III (moderate)     HD T- TH-SAT  . Sleep apnea     USES CPAP   . Angina     LAST HAD CP 2 MO AGO  DR WALL   . INTERNAL HEMORRHOIDS 10/16/2008    Qualifier: Diagnosis of  By: Nolon Rod CMA Deborra Medina), Shirlean Mylar        Past Surgical History  Procedure Laterality Date  . Hemorrhoid surgery    . Anal fissurectomy    . Av fistula placement    . Revision of fistula      renal failure  . Cardiac catheterization      2005 DR BRODIE  . Ventral hernia repair  07/01/2011    Procedure: HERNIA REPAIR VENTRAL ADULT;  Surgeon: Joyice Faster. Cornett, MD;  Location: [facility];  Service: General;  Laterality:  N/A;    in for   Chief Complaint  Patient presents with  . Hypotension     HPI This is a 64-year-old male patient with known chronic kidney disease on dialysis, diabetes on insulin with significant peripheral neuropathy, history of hypertension off medications since on dialysis, known CAD with last drug-eluting stent in 2014 at [facility], sleep apnea, and gout. Patient presents to the ER today with complaints of weakness after dialysis. His systolic blood pressures dipped into the 80s during dialysis and he was weak with standing. He also was weak prior to dialysis today and fell in his driveway before tanning dialysis. Since the fall he is then experiencing pain across the back of his shoulders and upper back as well as  in his lower back. He has not had any palpitations, or chest pain. He reports several weeks of feeling clumsy which he later clarifies as being weak and dizzy. Review of the old records shows that in early June patient had a mild CVA. He has not started any new medications. He is no longer on his Lopressor. He reports a few weeks ago while playing with the child at a local daycare that he caught an upper respiratory viral syndrome from the child. This was primarily manifested as sort throat and rhinorrhea and significant coughing with yellow sputum without fevers. He described this as feeling like the flu. He had previously been prescribed antibiotics for dental abscess during this same time period and completed a week of antibiotics. He is no longer having any upper respiratory symptoms. He is also complaining of significant cramping postdialysis treatment.  In the ER patient was afebrile. His supine blood pressure was 116/70. Patient was somewhat orthostatic when checked with blood pressure dropping to 82/47 with standing, minimal increase in pulse to 82. Patient has been started on IV fluids at 75 mL per hour in the ER. Letter will panel reveals sodium 134, BUN 26 creatinine  6.43 normal potassium, anion gap 16, troponin 0.04, normal CBC, glucose 96. EKG with sinus rhythm low voltage R waves in the inferior leads transitioning somewhat to the septal leads, QTC 434 ms, unchanged from previous EKGs.   Review of Systems   In addition to the HPI above,  No Fever-chills, myalgias or other constitutional symptoms No Headache, changes with Vision or hearing, new weakness, tingling, numbness in any extremity, No problems swallowing food or Liquids, indigestion/reflux No Chest pain, Cough or Shortness of Breath, palpitations, orthopnea or DOE No Abdominal pain, N/V; no melena or hematochezia, no dark tarry stools, Bowel movements are regular, No dysuria, hematuria or flank pain No new skin rashes, lesions, masses or bruises, No new joints pains-aches No recent weight gain or loss No polyuria, polydypsia or polyphagia,  *A full 10 point Review of Systems was done, except as stated above, all other Review of Systems were negative.  Social History Social History  Substance Use Topics  . Smoking status: Never Smoker   . Smokeless tobacco: Never Used  . Alcohol Use: No    Resides at: Private residence  Lives with: Wife  Ambulatory status: Typically without assistive devices   Family History Family History  Problem Relation Age of Onset  . Diabetes Sister   . Heart disease Sister   . Diabetes Maternal Aunt   . Diabetes Maternal Uncle   . Diabetes Paternal Aunt   . Diabetes Paternal Uncle   . Heart disease    . Heart disease Father   . Renal Disease Father   . Heart disease Brother   . Dementia Mother      Prior to Admission medications   Medication Sig Start Date End Date Taking? Authorizing Provider  ACCU-CHEK SOFTCLIX LANCETS lancets Use three times daily 11/16/13   Ricard Dillon, MD  allopurinol (ZYLOPRIM) 100 MG tablet Take 100 mg by mouth 2 (two) times daily.     Historical Provider, MD  amoxicillin (AMOXIL) 500 MG capsule Take 500 mg by mouth  3 (three) times daily. 10/29/14   Historical Provider, MD  aspirin 81 MG tablet Take 81 mg by mouth at bedtime.     Historical Provider, MD  atorvastatin (LIPITOR) 80 MG tablet Take 40 mg by mouth daily.    Historical Provider, MD  calcium acetate (PHOSLO) 667 MG capsule Take 667 mg by mouth 3 (three) times daily with meals.    Historical Provider, MD  cinacalcet (SENSIPAR) 60 MG tablet Take 60 mg by mouth every other day.    Historical Provider, MD  clopidogrel (PLAVIX) 75 MG tablet Take 75 mg by mouth daily.     Historical Provider, MD  diazepam (VALIUM) 5 MG tablet Take 1 tablet (5 mg total) by mouth at bedtime. 05/16/14   [name], MD  docusate sodium (COLACE) 100 MG capsule Take 100 mg by mouth daily.    Historical Provider, MD  gabapentin (NEURONTIN) 300 MG capsule Take 1 capsule (300 mg total) by mouth 2 (two) times daily. 06/25/14   [name], MD  glucose blood (ACCU-CHEK AVIVA PLUS) test strip Test three times daily 11/16/13   Ricard Dillon, MD  insulin glargine (LANTUS) 100 UNIT/ML injection Inject 90 Units into the skin at bedtime.      Historical Provider, MD  insulin lispro protamine-lispro (HUMALOG 75/25 MIX) (75-25) 100 UNIT/ML SUSP injection Inject 50 Units into the skin 2 (two) times daily with a meal. Patient taking differently: Inject 15 Units into the skin 2 (two) times daily with a meal.  07/21/13   Ricard Dillon, MD  metoprolol succinate (TOPROL-XL) 25 MG 24 hr tablet Take 12.5 mg by mouth daily.    Historical Provider, MD  multivitamin (RENA-VIT) TABS tablet Take 1 tablet by mouth every morning.     Historical Provider, MD  omeprazole (PRILOSEC) 20 MG capsule Take 20 mg by mouth daily.    Historical Provider, MD  solifenacin (VESICARE) 5 MG tablet Take 5 mg by mouth every morning.     Historical Provider, MD  tadalafil (CIALIS) 5 MG tablet Take 5 mg by mouth every evening.    Historical Provider, MD  Tamsulosin HCl (FLOMAX) 0.4 MG CAPS Take 0.4 mg by mouth every  morning.     Historical Provider, MD  topiramate (TOPAMAX) 100 MG tablet TAKE 1 TABLET (100 MG TOTAL) BY MOUTH 2 (TWO) TIMES DAILY. 08/22/14   [name], MD  traMADol (ULTRAM) 50 MG tablet Take 1 tablet (50 mg total) by mouth every 8 (eight) hours as needed. For pain. Take everyday per patient Patient taking differently: Take 50 mg by mouth every 8 (eight) hours as needed (takes with amoxicillin). For pain. Take everyday per patient 07/25/14   [name], MD    No Known Allergies  Physical Exam  Vitals  Blood pressure 104/69, pulse 77, resp. rate 14, height 5\' 10"  (1.778 m), weight 229 lb 4.4 oz (103.998 kg), SpO2 98 %.   General:  In no acute distress although mildly lethargic although easily arousable and able to converse appropriately  Psych:  Normal affect, Denies Suicidal or Homicidal ideations,  Oriented X 3. Speech and thought patterns are clear and appropriate, no apparent short term memory deficits  Neuro:   No focal neurological deficits, CN II through XII intact, Strength 5/5 all 4 extremities, Sensation intact all 4 extremities.  ENT:  Ears and Eyes appear Normal, Conjunctivae clear, PER. Moist oral mucosa without erythema or exudates.  Neck:  Supple, No lymphadenopathy appreciated  Respiratory:  Symmetrical chest wall movement, Good air movement bilaterally, CTAB. Room Air  Cardiac:  RRR, No Murmurs, no LE edema noted, no JVD, No carotid bruits, peripheral pulses palpable at 2+  Abdomen:  Positive bowel sounds, Soft, Non tender, Non distended,  No masses appreciated, no  obvious hepatosplenomegaly  Skin:  No Cyanosis, Normal Skin Turgor, No Skin Rash or Bruise.  Extremities: Symmetrical without obvious trauma or injury,  no effusions.  Data Review  CBC  Recent Labs Lab 01/15/15 1308  WBC 5.3  HGB 13.9  HCT 41.9  PLT 266  MCV 89.1  MCH 29.6  MCHC 33.2  RDW 15.2    Chemistries   Recent Labs Lab 01/15/15 1308  NA 134*  K 4.4  CL 93*  CO2  25  GLUCOSE 96  BUN 26*  CREATININE 6.43*  CALCIUM 10.3    estimated creatinine clearance is 13.7 mL/min (by C-G formula based on Cr of 6.43).  No results for input(s): TSH, T4TOTAL, T3FREE, THYROIDAB in the last 72 hours.  Invalid input(s): FREET3  Coagulation profile No results for input(s): INR, PROTIME in the last 168 hours.  No results for input(s): DDIMER in the last 72 hours.  Cardiac Enzymes No results for input(s): CKMB, TROPONINI, MYOGLOBIN in the last 168 hours.  Invalid input(s): CK  Invalid input(s): POCBNP  Urinalysis    Component Value Date/Time   COLORURINE AMBER* 11/01/2014 1957   APPEARANCEUR CLOUDY* 11/01/2014 1957   LABSPEC 1.018 11/01/2014 1957   PHURINE 7.5 11/01/2014 1957   GLUCOSEU NEGATIVE 11/01/2014 1957   HGBUR NEGATIVE 11/01/2014 1957   BILIRUBINUR SMALL* 11/01/2014 1957   KETONESUR NEGATIVE 11/01/2014 1957   PROTEINUR 100* 11/01/2014 1957   UROBILINOGEN 1.0 11/01/2014 1957   NITRITE NEGATIVE 11/01/2014 1957   LEUKOCYTESUR SMALL* 11/01/2014 1957    Imaging results:   No results found.   EKG: (Independently reviewed) sinus rhythm low voltage R waves in the inferior leads transitioning somewhat to the septal leads, QTC 434 ms, unchanged from previous EKGs.   Assessment & Plan  Principal Problem:   Orthostatic hypotension -Admit to telemetry -Reported dry weight 104 kg and apparently started at 308 kg today; uncertain if dry weight needs to change or if patient has developed innominate dysfunction and would benefit from midodrine at least on dialysis days -We'll give normal saline at 75 mL per hour 12 hours then proceed with when necessary boluses for recurrent hypotension -Repeat OVS in a.m. -Did not take any anti-hypertensive medications prior to admission -Also takes Flomax for apparent underlying BPH; since on dialysis if is not making significant amount of urine at baseline likely can discontinue this medication since this can  contribute to orthostatic hypotension  Active Problems:   ESRD on dialysis -Completed a full session of dialysis on 8/23 so not due again until 8/25    Fall at home  -Suspect related to orthostasis prior to dialysis treatment which is further suggestive of underlying autonomic dysfunction -Follow-up on OVS -PT evaluation    Diabetes mellitus with insulin therapy -Medication reconciliation shows patient both on Lantus as well as 75/25 mixed insulin -Need to clarify if is actually taking both of these medications at home -I have only ordered the Lantus with sliding scale insulin -Last hemoglobin A1c was 7.6 in May 2016    Essential hypertension -Has been off antihypertensives medications for greater than 1 year since on dialysis with lower blood pressures while on dialysis    CAD -s/p DES to marginal vessel at [facility] 2014 -No typical chest pain symptoms and admission EKG and troponin negative -No indication to cycle troponin at this time    Obstructive sleep apnea -Monitor for nocturnal hypoxemia    Lower extremity neuropathy -Resume preadmission medications    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 ANP on 01/15/2015 at 3:06 PM  Between 7am to 7pm - Pager - [phone]  After 7pm go to [email or web address] - password [email or web address]  And look for the night coverage person covering me after hours  [facility]  Addendum I personally evaluated patient on 01/15/2015 and agree with the above findings. Mr. Jefferys is a 64 year old gentleman  with a past medical history of chronic kidney disease on hemodialysis, history of coronary artery disease status post percutaneous intervention, hypertension, who was transferred to the emergency department from the dialysis center with complaints of generalized weakness. He was found to be hypotensive having systolic blood pressures in the 80s. On admission he was found to be orthostatic with systolic blood pressures falling by nearly 20 mHg from laying to standing. Will provide gentle IV fluid hydration overnight, repeat orthostatics in a.m. On physical exam he appears weak. On physical examination had regular rate and rhythm, lungs were clear to auscultation. There is no extremity edema.

## 2015-01-15 NOTE — ED Notes (Signed)
Pt arrives via [facility] from dialysis where he had his full treatment today. Dialysis reported that patient became very lethargic, had syncopal episode with LOC, did not report how long patient was out. Reported bp of 80/40. BP 120/84 with ems. Pt also had a fall this am in his driveway and c/o hip and back pain, did not hit his head or have LOC with fall. Pt alert, oriented.

## 2015-01-16 DIAGNOSIS — I951 Orthostatic hypotension: Principal | ICD-10-CM

## 2015-01-16 LAB — BASIC METABOLIC PANEL
Anion gap: 14 (ref 5–15)
BUN: 38 mg/dL — AB (ref 6–20)
CO2: 24 mmol/L (ref 22–32)
CREATININE: 8.37 mg/dL — AB (ref 0.61–1.24)
Calcium: 8.8 mg/dL — ABNORMAL LOW (ref 8.9–10.3)
Chloride: 93 mmol/L — ABNORMAL LOW (ref 101–111)
GFR calc Af Amer: 7 mL/min — ABNORMAL LOW (ref >60)
GFR, EST NON AFRICAN AMERICAN: 6 mL/min — AB (ref >60)
Glucose, Bld: 156 mg/dL — ABNORMAL HIGH (ref 65–99)
POTASSIUM: 5 mmol/L (ref 3.5–5.1)
SODIUM: 131 mmol/L — AB (ref 135–145)

## 2015-01-16 LAB — MRSA PCR SCREENING: MRSA BY PCR: NEGATIVE

## 2015-01-16 LAB — GLUCOSE, CAPILLARY
GLUCOSE-CAPILLARY: 110 mg/dL — AB (ref 65–99)
GLUCOSE-CAPILLARY: 120 mg/dL — AB (ref 65–99)

## 2015-01-16 MED ORDER — RENA-VITE PO TABS: 1.0000 | ORAL_TABLET | Freq: Every day | ORAL | Status: DC

## 2015-01-16 MED ORDER — TAMSULOSIN HCL 0.4 MG PO CAPS
0.4000 mg | ORAL_CAPSULE | Freq: Every day | ORAL | Status: DC
  Administered 2015-01-16: 0.4 mg via ORAL
  Filled 2015-01-16: qty 1

## 2015-01-16 NOTE — Progress Notes (Signed)
[name] to be D/C'd Home per MD order.  Discussed prescriptions and follow up appointments with the patient. Prescriptions given to patient, medication list explained in detail. Pt verbalized understanding.    Medication List    TAKE these medications        ACCU-CHEK SOFTCLIX LANCETS lancets  Use three times daily     allopurinol 100 MG tablet  Commonly known as:  ZYLOPRIM  Take 100 mg by mouth 2 (two) times daily.     aspirin 81 MG tablet  Take 81 mg by mouth at bedtime.     atorvastatin 80 MG tablet  Commonly known as:  LIPITOR  Take 40 mg by mouth daily.     calcium acetate 667 MG capsule  Commonly known as:  PHOSLO  Take 2,668 mg by mouth 3 (three) times daily with meals.     cinacalcet 60 MG tablet  Commonly known as:  SENSIPAR  Take 60 mg by mouth daily.     clopidogrel 75 MG tablet  Commonly known as:  PLAVIX  Take 75 mg by mouth daily.     diazepam 5 MG tablet  Commonly known as:  VALIUM  Take 1 tablet (5 mg total) by mouth at bedtime.     FLOMAX 0.4 MG Caps capsule  Generic drug:  tamsulosin  Take 0.4 mg by mouth every morning.     gabapentin 300 MG capsule  Commonly known as:  NEURONTIN  Take 1 capsule (300 mg total) by mouth 2 (two) times daily.     glucose blood test strip  Commonly known as:  ACCU-CHEK AVIVA PLUS  Test three times daily     insulin glargine 100 UNIT/ML injection  Commonly known as:  LANTUS  Inject 90 Units into the skin at bedtime.     insulin lispro protamine-lispro (75-25) 100 UNIT/ML Susp injection  Commonly known as:  HUMALOG 75/25 MIX  Inject 50 Units into the skin 2 (two) times daily with a meal.     multivitamin Tabs tablet  Take 1 tablet by mouth every morning.     omeprazole 20 MG capsule  Commonly known as:  PRILOSEC  Take 20 mg by mouth daily.     solifenacin 5 MG tablet  Commonly known as:  VESICARE  Take 5 mg by mouth every morning.     topiramate 100 MG tablet  Commonly known as:  TOPAMAX  TAKE  1 TABLET (100 MG TOTAL) BY MOUTH 2 (TWO) TIMES DAILY.     traMADol 50 MG tablet  Commonly known as:  ULTRAM  Take 1 tablet (50 mg total) by mouth every 8 (eight) hours as needed. For pain. Take everyday per patient        Filed Vitals:   01/16/15 1016  BP:   Pulse: 86  Temp: 98.4 F (36.9 C)  Resp: 18    Skin clean, dry and intact without evidence of skin break down, no evidence of skin tears noted. IV catheter discontinued intact. Site without signs and symptoms of complications. Dressing and pressure applied. Pt denies pain at this time. No complaints noted.  An After Visit Summary was printed and given to the patient. Patient escorted via [place], and D/C home via private auto.  Marijean Heath C 01/16/2015 12:28 PM

## 2015-01-16 NOTE — Discharge Instructions (Signed)

## 2015-01-16 NOTE — Evaluation (Signed)
Physical Therapy Evaluation & Discharge Patient Details Name: [name] MRN: [id number] DOB: [birth year] Today's Date: 01/16/2015   History of Present Illness  Patient is a 64 y/o male admitted with syncope after dialysis admitted with orthostatic hypotension.  PMH positive for chronic kidney disease on dialysis, diabetes on insulin with significant peripheral neuropathy, history of hypertension off medications since on dialysis, known CAD with last drug-eluting stent in 2014 at [facility], sleep apnea, and gout  Clinical Impression  Patient presents with mobility limited mainly by pain after fall, but mostly presents at his functional baseline.  Feel no further skilled PT intervention necessary at this time.    Follow Up Recommendations No PT follow up    Equipment Recommendations  None recommended by PT    Recommendations for Other Services       Precautions / Restrictions Precautions Precautions: Fall      Mobility  Bed Mobility Overal bed mobility: Modified Independent             General bed mobility comments: able to sit up straight, but c/o pain so educated on sitting up through sidelying  Transfers Overall transfer level: Modified independent Equipment used: Rolling walker (2 wheeled)                Ambulation/Gait Ambulation/Gait assistance: Modified independent (Device/Increase time) Ambulation Distance (Feet): 400 Feet Assistive device: Rolling walker (2 wheeled) Gait Pattern/deviations: Step-through pattern;Decreased stride length     General Gait Details: balances well with walker and, though slower on turns and c/o UE fatigue with ambulation, did not demonstrate loss of balance or unsafe practices even on turns  Stairs            Wheelchair Mobility    Modified Rankin (Stroke Patients Only)       Balance Overall balance assessment: History of Falls;Needs assistance         Standing balance support: No upper extremity  supported Standing balance-Leahy Scale: Fair Standing balance comment: standing intially without UE support, walker for ambulation                             Pertinent Vitals/Pain Pain Assessment: 0-10 Pain Score: 8  Pain Location: lower back, shoulders Pain Descriptors / Indicators: Aching;Sore Pain Intervention(s): Monitored during session;Patient requesting pain meds-RN notified    Home Living Family/patient expects to be discharged to:: Private residence Living Arrangements: Spouse/significant other Available Help at Discharge: Family Type of Home: House Home Access: Level entry (at back of home)     Home Layout: Able to live on main level with bedroom/bathroom;Multi-level Home Equipment: Walker - 2 wheels      Prior Function Level of Independence: Independent with assistive device(s)         Comments: reports only using walker in the home     Hand Dominance        Extremity/Trunk Assessment               Lower Extremity Assessment: RLE deficits/detail;LLE deficits/detail RLE Deficits / Details: c/o sorness limited manual resistance, strength at least 3+/5 throughout LLE Deficits / Details: c/o sorness limited manual resistance, strength at least 3+/5 throughout     Communication   Communication: No difficulties  Cognition Arousal/Alertness: Awake/alert Behavior During Therapy: WFL for tasks assessed/performed Overall Cognitive Status: Within Functional Limits for tasks assessed  General Comments      Exercises        Assessment/Plan    PT Assessment Patent does not need any further PT services  PT Diagnosis Difficulty walking;Acute pain   PT Problem List    PT Treatment Interventions     PT Goals (Current goals can be found in the Care Plan section) Acute Rehab PT Goals PT Goal Formulation: All assessment and education complete, DC therapy    Frequency     Barriers to discharge         Co-evaluation               End of Session Equipment Utilized During Treatment: Gait belt Activity Tolerance: Patient tolerated treatment well Patient left: in bed;with call bell/phone within reach      Functional Assessment Tool Used: Clinical Judgement Functional Limitation: Mobility: Walking and moving around Mobility: Walking and Moving Around Current Status (U1314): At least 1 percent but less than 20 percent impaired, limited or restricted Mobility: Walking and Moving Around Goal Status [phone]): At least 1 percent but less than 20 percent impaired, limited or restricted Mobility: Walking and Moving Around Discharge Status [phone]): At least 1 percent but less than 20 percent impaired, limited or restricted    Time: 1030-1107 PT Time Calculation (min) (ACUTE ONLY): 37 min   Charges:   PT Evaluation $Initial PT Evaluation Tier I: 1 Procedure PT Treatments $Gait Training: 8-22 mins   PT G Codes:   PT G-Codes **NOT FOR INPATIENT CLASS** Functional Assessment Tool Used: Clinical Judgement Functional Limitation: Mobility: Walking and moving around Mobility: Walking and Moving Around Current Status (Q2060): At least 1 percent but less than 20 percent impaired, limited or restricted Mobility: Walking and Moving Around Goal Status [phone]): At least 1 percent but less than 20 percent impaired, limited or restricted Mobility: Walking and Moving Around Discharge Status [phone]): At least 1 percent but less than 20 percent impaired, limited or restricted    [facility] 01/16/2015, 1:54 PM  [place], [place] 01/16/2015

## 2015-01-16 NOTE — Discharge Summary (Signed)
[facility]  Physician Discharge Summary   Patient ID: [name] MRN: [id number] DOB/AGE: 10-11-1950 64 y.o.  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 Patient is to resume his dialysis starting tomorrow.   DISCHARGE CONDITION: fair  Diet recommendation: As before  Filed Weights   01/15/15 1242  Weight: 103.998 kg (229 lb 4.4 oz)    INITIAL HISTORY: 64 -year-old African-American male with a past medical history of end-stage renal disease on hemodialysis and diabetes along with other medical problems presented with complaints of dizziness after dialysis session.  Consultations:  None  Procedures:  None  HOSPITAL COURSE:   Orthostatic hypotension Possibly due to excessive fluid pull off during dialysis. The EKG did not show any concerning changes. Patient did not have any chest pain. Telemetry does not show any arrhythmias. Patient is feeling much better this morning. He has ambulated without any difficulties. Okay for discharge today. He may go for his usual dialysis tomorrow. If his symptoms recur, then may need to look at his home medications to adjust some of the doses or discontinue some of the medications such as Flomax.  End-stage renal disease on hemodialysis He may resume outpatient dialysis schedule starting tomorrow. Discussed with Dr. Jonnie Finner with nephrology. He agrees with plan.  Recent fall at home Mechanical fall, per patient. Seen by physical therapy. No need for any home health.  History of diabetes mellitus  on insulin Continue with his home medication regimen.  History of essential hypertension Has been off of antihypertensives medications for over a year.  History of coronary artery disease Stable  Other medical conditions are all stable.  Discussed in detail with the patient. Okay for discharge today.    PERTINENT LABS:  The results of significant diagnostics from this hospitalization (including imaging, microbiology, ancillary and laboratory) are listed below for reference.    Microbiology: Recent Results (from the past 240 hour(s))  MRSA PCR Screening     Status: None   Collection Time: 01/16/15  6:44 AM  Result Value Ref Range Status   MRSA by PCR NEGATIVE NEGATIVE Final    Comment:        The GeneXpert MRSA Assay (FDA approved for NASAL specimens only), is one component of a comprehensive MRSA colonization surveillance program. It is not intended to diagnose MRSA infection nor to guide or monitor treatment for MRSA infections.      Labs: Basic Metabolic Panel:  Recent Labs Lab 01/15/15 1308 01/16/15 0513  NA 134* 131*  K 4.4 5.0  CL 93* 93*  CO2 25 24  GLUCOSE 96 156*  BUN 26* 38*  CREATININE 6.43* 8.37*  CALCIUM 10.3 8.8*   CBC:  Recent Labs Lab 01/15/15 1308  WBC 5.3  HGB 13.9  HCT 41.9  MCV 89.1  PLT 266   CBG:  Recent Labs Lab 01/15/15 1308 01/15/15 1823 01/15/15 2102 01/16/15 0744 01/16/15 1133  GLUCAP 92 156* 189* 110* 120*     IMAGING STUDIES No results found.  DISCHARGE EXAMINATION: Filed Vitals:   01/15/15 1730 01/15/15 2048 01/16/15  0631 01/16/15 1016  BP: 119/80  107/79   Pulse: 95 86 78 86  Temp:  98.5 F (36.9 C) 98 F (36.7 C) 98.4 F (36.9 C)  TempSrc:  Oral Oral Oral  Resp: 13 16 18 18   Height:      Weight:      SpO2: 98% 95% 100% 100%   General appearance: alert, cooperative, appears stated age and no distress Resp: clear to auscultation bilaterally Cardio: regular rate and rhythm, S1, S2 normal, no  murmur, click, rub or gallop GI: soft, non-tender; bowel sounds normal; no masses,  no organomegaly  DISPOSITION: Home  Discharge Instructions    Call MD for:  difficulty breathing, headache or visual disturbances    Complete by:  As directed      Call MD for:  extreme fatigue    Complete by:  As directed      Call MD for:  persistant dizziness or light-headedness    Complete by:  As directed      Call MD for:  persistant nausea and vomiting    Complete by:  As directed      Call MD for:  severe uncontrolled pain    Complete by:  As directed      Diet Carb Modified    Complete by:  As directed      Discharge instructions    Complete by:  As directed   Go for dialysis as per usual schedule starting tomorrow 8/25. You medications may need to be readjusted by your doctor if you continue to have symptoms of dizziness after dialysis.   You were cared for by a hospitalist during your hospital stay. If you have any questions about your discharge medications or the care you received while you were in the hospital after you are discharged, you can call the unit and asked to speak with the hospitalist on call if the hospitalist that took care of you is not available. Once you are discharged, your primary care physician will handle any further medical issues. Please note that NO REFILLS for any discharge medications will be authorized once you are discharged, as it is imperative that you return to your primary care physician (or establish a relationship with a primary care physician if you do not have one) for your aftercare needs so that they can reassess your need for medications and monitor your lab values. If you do not have a primary care physician, you can call [phone] for a physician referral.     Increase activity slowly    Complete by:  As directed            ALLERGIES: No Known Allergies   Discharge Medication List as of 01/16/2015 12:38 PM    CONTINUE these medications which have  NOT CHANGED   Details  allopurinol (ZYLOPRIM) 100 MG tablet Take 100 mg by mouth 2 (two) times daily. , Until Discontinued, Historical Med    aspirin 81 MG tablet Take 81 mg by mouth at bedtime. , Until Discontinued, Historical Med    atorvastatin (LIPITOR) 80 MG tablet Take 40 mg by mouth daily., Until Discontinued, Historical Med    calcium acetate (PHOSLO) 667 MG capsule Take 2,668 mg by mouth 3 (three) times daily with meals. , Until Discontinued, Historical Med    cinacalcet (SENSIPAR) 60 MG tablet Take 60 mg by mouth daily. , Until Discontinued, Historical Med    clopidogrel (PLAVIX) 75 MG tablet Take 75 mg by mouth daily. , Until Discontinued,  Historical Med    diazepam (VALIUM) 5 MG tablet Take 1 tablet (5 mg total) by mouth at bedtime., Starting 05/16/2014, Until Discontinued, Print    gabapentin (NEURONTIN) 300 MG capsule Take 1 capsule (300 mg total) by mouth 2 (two) times daily., Starting 06/25/2014, Until Discontinued, Normal    insulin glargine (LANTUS) 100 UNIT/ML injection Inject 90 Units into the skin at bedtime.  , Until Discontinued, Historical Med    insulin lispro protamine-lispro (HUMALOG 75/25 MIX) (75-25) 100 UNIT/ML SUSP injection Inject 50 Units into the skin 2 (two) times daily with a meal., Starting 07/21/2013, Until Discontinued, Normal    multivitamin (RENA-VIT) TABS tablet Take 1 tablet by mouth every morning. , Until Discontinued, Historical Med    omeprazole (PRILOSEC) 20 MG capsule Take 20 mg by mouth daily., Until Discontinued, Historical Med    solifenacin (VESICARE) 5 MG tablet Take 5 mg by mouth every morning. , Until Discontinued, Historical Med    Tamsulosin HCl (FLOMAX) 0.4 MG CAPS Take 0.4 mg by mouth every morning. , Until Discontinued, Historical Med    topiramate (TOPAMAX) 100 MG tablet TAKE 1 TABLET (100 MG TOTAL) BY MOUTH 2 (TWO) TIMES DAILY., Normal    traMADol (ULTRAM) 50 MG tablet Take 1 tablet (50 mg total) by mouth every 8 (eight) hours  as needed. For pain. Take everyday per patient, Starting 07/25/2014, Until Discontinued, Print    ACCU-CHEK SOFTCLIX LANCETS lancets Use three times daily, Normal    glucose blood (ACCU-CHEK AVIVA PLUS) test strip Test three times daily, Normal       Follow-up Information    Follow up with Garret Reddish, MD. Schedule an appointment as soon as possible for a visit in 1 week.   Specialty:  Family Medicine   Why:  post hospitalization follow up   Contact information:   [address] [place] [address] [phone]       TOTAL DISCHARGE TIME: 65 mins  [facility] Pager [phone]  01/16/2015, 3:21 PM

## 2015-01-17 ENCOUNTER — Telehealth: Payer: Self-pay | Admitting: *Deleted

## 2015-01-17 NOTE — Telephone Encounter (Signed)
Attempted to call patient on home/cell number, but unable to leave a message.  Transitional Care Management

## 2015-01-17 NOTE — Telephone Encounter (Signed)
Pt states he is at dialysis . If you could cb 1pm or after he will be at home.

## 2015-01-18 NOTE — Telephone Encounter (Signed)
Patient was discharged 01/16/15 Patient was discharged to home Patient has an appointment 01/21/15 with Dr Yong Channel

## 2015-01-18 NOTE — Telephone Encounter (Signed)
Pt is returning rachel call °

## 2015-01-18 NOTE — Telephone Encounter (Signed)
Transition Care Management Follow-up Telephone Call  How have you been since you were released from the hospital? All right but tired   Do you understand why you were in the hospital? yes   Do you understand the discharge instrcutions? yes  Items Reviewed:  Medications reviewed: yes  Allergies reviewed: yes  Dietary changes reviewed: yes  Referrals reviewed: yes   Functional Questionnaire:   Activities of Daily Living (ADLs):   He states they are independent in the following: ambulation, bathing and hygiene, feeding, continence, grooming, toileting and dressing States they require assistance with the following: none   Any transportation issues/concerns?: no   Any patient concerns? Yes - balance concerns   Confirmed importance and date/time of follow-up visits scheduled: yes   Confirmed with patient if condition begins to worsen call PCP or go to the ER.  Patient was given the Call-a-Nurse line [phone]: yes Spoke with patient and he will call back

## 2015-01-21 ENCOUNTER — Encounter: Payer: Self-pay | Admitting: Family Medicine

## 2015-01-21 ENCOUNTER — Ambulatory Visit (INDEPENDENT_AMBULATORY_CARE_PROVIDER_SITE_OTHER): Payer: Medicare Other | Admitting: Family Medicine

## 2015-01-21 VITALS — BP 118/60 | HR 77 | Temp 98.3°F | Wt 240.0 lb

## 2015-01-21 DIAGNOSIS — R413 Other amnesia: Secondary | ICD-10-CM

## 2015-01-21 DIAGNOSIS — R42 Dizziness and giddiness: Secondary | ICD-10-CM | POA: Diagnosis not present

## 2015-01-21 DIAGNOSIS — R2689 Other abnormalities of gait and mobility: Secondary | ICD-10-CM

## 2015-01-21 DIAGNOSIS — I951 Orthostatic hypotension: Secondary | ICD-10-CM | POA: Diagnosis not present

## 2015-01-21 DIAGNOSIS — G5793 Unspecified mononeuropathy of bilateral lower limbs: Secondary | ICD-10-CM

## 2015-01-21 DIAGNOSIS — R29818 Other symptoms and signs involving the nervous system: Secondary | ICD-10-CM

## 2015-01-21 DIAGNOSIS — G5792 Unspecified mononeuropathy of left lower limb: Secondary | ICD-10-CM

## 2015-01-21 DIAGNOSIS — G5791 Unspecified mononeuropathy of right lower limb: Secondary | ICD-10-CM

## 2015-01-21 MED ORDER — GABAPENTIN 100 MG PO CAPS: 100.0000 mg | ORAL_CAPSULE | Freq: Every day | ORAL | Status: DC

## 2015-01-21 NOTE — Patient Instructions (Signed)
Change gabapentin to 100mg  daily. You are currently taking 600mg  daily and max dose for dialysis patients is 150mg  per uptodate. If Dr. Lorrene Reid is ok with higher doses, please have her send me a note or give [place] a call and I can adjust up.   We are going to make sure you did not have a stroke given continued dizziness issues. We will call you about your referral to get MRI. If you do not hear within 1 week, give [place] a call. Not clear that there would be more we could do if you had a stroke given already on plavix on aspirin but may lead to further investigation.   I would advise follow up in 2-3 weeks (same day slot ok for front desk)

## 2015-01-21 NOTE — Progress Notes (Addendum)
Ronald Reddish, MD  Subjective:  [name] is a 64 y.o. year old very pleasant male patient who presents for transitional care management and hospital follow up for orthostatic hypotension/dizziness. Patient was hospitalized from 01/15/15 to 01/16/15. A TCM phone call was completed on 01/17/15. Medical complexity high.   Review of hospital records shows: 64 year old male with a history of end-stage renal disease on hemodialysis. Patient presented to the hospital with dizziness after dialysis session and was found to have orthostatic hypotension there was concern this was due to excessive fluid in equaled in dialysis. He had EKG that did not show any concerning changes. Patient denied chest pain. No palpitations and telemetry did not show arrhythmias. It was noted that patient was feeling much better the following morning. He ambulated without any difficulty and was deemed safe to discharge home. He was to return to dialysis the next day. Discussed with patient that he had recurrent symptoms he might need to consider adjusting home medicine such as Flomax through PCP. Patient also had a mechanical fall at home previously he was seen by physical therapy and did not need home health services. He was continued on his home diabetes regimen in the hospital. His blood pressure was controlled in the hospital and has been since being on dialysis despite not being on medications. Labs show mild hyponatremia consistent with baseline. Elevated creatinine but known ESRD patient. I stat troponin not elevated. CBC normal. CBGs <200.   Patient tells me today that he was not feeling any better on the day of discharge. He states he just wanted to get out of the hospital so he misinformed medical staff. Patient states he did not want to have dialysis in the hospital in much prefer to have his home location. I strongly advised patient never to lie to medical professionals.   Patient is concerned he may have had another  stroke. He complains of balance issues increasing from baseline. He feels more dizzy than normal. Does not feel different after dialysis. He is using a rolling walker to help get around since getting home. He feels like the left side of his face is weak. States he has had water drip out left corner of his mouth. Also complains of generalized weakness. No significant increased short-term memory loss. Denies any recurrent falls at home despite reported balance issues.  ROS- no chest pain or shortness of breath. He feels overall weak but Denies unilateral extremity weakness more than normal.   Past Medical History-  Patient Active Problem List   Diagnosis Date Noted  . CAD -s/p DES to marginal vessel at [facility] 04/09/2009    Priority: High  . ESRD on dialysis 02/27/2009    Priority: High  . Diabetes mellitus with insulin therapy 09/05/2007    Priority: High  . Gout 12/22/2009    Priority: Medium  . Obstructive sleep apnea 07/10/2009    Priority: Medium  . BPH (benign prostatic hyperplasia) 09/25/2008    Priority: Medium  . Low back pain 12/15/2007    Priority: Medium  . Posttraumatic stress disorder 11/03/2007    Priority: Medium  . Essential hypertension 01/17/2007    Priority: Medium  . CVA (cerebral infarction) 01/17/2007    Priority: Medium  . GERD (gastroesophageal reflux disease) 05/16/2014    Priority: Low  . Trigger thumb of right hand 01/03/2014    Priority: Low  . Left foot drop 05/02/2013    Priority: Low  . Risk for falls 01/22/2012    Priority:  Low  . Lower extremity neuropathy 10/14/2010    Priority: Low  . Gastroparesis 06/19/2008    Priority: Low  . Allergic rhinitis 03/01/2007    Priority: Low  . Orthostatic hypotension 01/15/2015  . Fall at home 01/15/2015  . 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 ACCU-CHEK SOFTCLIX  LANCETS lancets Use three times daily 200 each 3  . allopurinol (ZYLOPRIM) 100 MG tablet Take 100 mg by mouth 2 (two) times daily.     [name] aspirin 81 MG tablet Take 81 mg by mouth at bedtime.     [name] atorvastatin (LIPITOR) 80 MG tablet Take 40 mg by mouth daily.    . calcium acetate (PHOSLO) 667 MG capsule Take 2,668 mg by mouth 3 (three) times daily with meals.     . cinacalcet (SENSIPAR) 60 MG tablet Take 60 mg by mouth daily.     . clopidogrel (PLAVIX) 75 MG tablet Take 75 mg by mouth daily.     . diazepam (VALIUM) 5 MG tablet Take 1 tablet (5 mg total) by mouth at bedtime. 30 tablet 5  . glucose blood (ACCU-CHEK AVIVA PLUS) test strip Test three times daily 200 each 3  . insulin glargine (LANTUS) 100 UNIT/ML injection Inject 90 Units into the skin at bedtime.      . insulin lispro protamine-lispro (HUMALOG 75/25 MIX) (75-25) 100 UNIT/ML SUSP injection Inject 50 Units into the skin 2 (two) times daily with a meal. (Patient taking differently: Inject 15 Units into the skin 2 (two) times daily with a meal. ) 30 mL 12  . multivitamin (RENA-VIT) TABS tablet Take 1 tablet by mouth every morning.     [name] omeprazole (PRILOSEC) 20 MG capsule Take 20 mg by mouth daily.    . solifenacin (VESICARE) 5 MG tablet Take 5 mg by mouth every morning.     . Tamsulosin HCl (FLOMAX) 0.4 MG CAPS Take 0.4 mg by mouth every morning.     . topiramate (TOPAMAX) 100 MG tablet TAKE 1 TABLET (100 MG TOTAL) BY MOUTH 2 (TWO) TIMES DAILY. 90 tablet 1  . traMADol (ULTRAM) 50 MG tablet Take 1 tablet (50 mg total) by mouth every 8 (eight) hours as needed. For pain. Take everyday per patient (Patient taking differently: Take 50 mg by mouth every 8 (eight) hours as needed (takes with amoxicillin). For pain. Take everyday per patient) 30 tablet 1  . gabapentin (NEURONTIN) 100 MG capsule Take 1 capsule (100 mg total) by mouth daily. 30 capsule 3   No current facility-administered medications for this visit.    Objective: BP 118/60  mmHg  Pulse 77  Temp(Src) 98.3 F (36.8 C)  Wt 240 lb (108.863 kg) Gen: NAD, resting comfortably in chair, rolling walker by his side.  CV: RRR no murmurs rubs or gallops, HR remains RRR even when dizzy Lungs: CTAB no crackles, wheeze, rhonchi Abdomen: soft/nontender/nondistended/normal bowel sounds. No rebound or guarding.  Ext: no edema Skin: warm, dry, no rash Neuro: CN II-XII intact- no facial droop on left specifically, sensation and reflexes normal throughout, 4/5 muscle strength in RLE, 4+/5 on RUE. 4/5 LLE, 5/5 LUE. [name] Normal finger to nose. Normal rapid alternating movements. No pronator drift. Falls to right on romberg.   Assessment/Plan:  Dizzy/Short-term memory loss/Balance problem  - Plan: MR Brain Wo Contrast to rule out CVA This should have been done in hospital likely but patient  wanted to leave hospital so did not tell MDs there about persistent symptoms. Patient had CVA 2007 with residual memory issues and some R sided hemiplegia as well as dysphagia at times. Patient effort was poor during exam and unclear what his baseline neuro exam is anyway. I did not note facial droop that patient reported. Already on plavix and aspirin (discussed likely only needs to be on plavix but could discuss with cards). If had stroke through this, plan for neuro eval. Could consider aggrenox. Would need further investigation if stroke positive. Balance issues are regardless of changing positions so doubt this is orthostatic hypotension at this poitn.   Discussed other concerns like flomax and valium contributing to dizziness as well as potential gait and balance training. Patient would liek to pursue MRI before evaluating those further.   Lower extremity neuropathy I had continued the gabapentin prescribed by the [place]. Noted today this is too high given he is on dialysis. Will reduce dose to 600 mg total per day to 100 mg daily max. He is to discuss with his nephrologist   Return precautions  advised.   Orders Placed This Encounter  Procedures  . MR Brain Wo Contrast    EPIC ORDER/ [id number]/NOT CLAUS/NO PREV SX/NO PACEMAKER,STIMULATOR OR DEFIBRILLATOR/NO METAL IN EYES/ PT IS ON DIALYSIS/ PT HAS 2 HEART STENTS/NO PENILE IMPLANT OR OTHER IMPLANT/NO NEEDS/INS-UHC/CLC/PT    Standing Status: Future     Number of Occurrences:      Standing Expiration Date: 03/22/2016    Order Specific Question:  Reason for Exam (SYMPTOM  OR DIAGNOSIS REQUIRED)    Answer:  concern for stroke. Balance issuse, short term memory loss, dizzy    Order Specific Question:  Preferred imaging location?    Answer:  [id number]    Order Specific Question:  Does the patient have a pacemaker or implanted devices?    Answer:  No    Order Specific Question:  What is the patient's sedation requirement?    Answer:  No Sedation    Meds ordered this encounter  Medications  . gabapentin (NEURONTIN) 100 MG capsule    Sig: Take 1 capsule (100 mg total) by mouth daily.    Dispense:  30 capsule    Refill:  3

## 2015-01-22 NOTE — Assessment & Plan Note (Signed)
I had continued the gabapentin prescribed by the [place]. Noted today this is too high given he is on dialysis. Will reduce dose to 600 mg total per day to 100 mg daily max. He is to discuss with his nephrologist

## 2015-02-01 ENCOUNTER — Ambulatory Visit
Admission: RE | Admit: 2015-02-01 | Discharge: 2015-02-01 | Disposition: A | Discharge status: Home / Self Care | Payer: Medicare Other | Source: Ambulatory Visit | Attending: Family Medicine | Admitting: Family Medicine

## 2015-02-01 DIAGNOSIS — R2689 Other abnormalities of gait and mobility: Secondary | ICD-10-CM

## 2015-02-01 DIAGNOSIS — R42 Dizziness and giddiness: Secondary | ICD-10-CM

## 2015-02-01 DIAGNOSIS — R413 Other amnesia: Secondary | ICD-10-CM

## 2015-02-18 ENCOUNTER — Ambulatory Visit: Payer: Medicare Other | Attending: Nephrology | Admitting: Physical Therapy

## 2015-02-18 ENCOUNTER — Encounter: Payer: Self-pay | Admitting: Physical Therapy

## 2015-02-18 DIAGNOSIS — R269 Unspecified abnormalities of gait and mobility: Secondary | ICD-10-CM | POA: Diagnosis not present

## 2015-02-18 DIAGNOSIS — R2681 Unsteadiness on feet: Secondary | ICD-10-CM | POA: Insufficient documentation

## 2015-02-20 ENCOUNTER — Encounter: Payer: Self-pay | Admitting: Physical Therapy

## 2015-02-20 NOTE — Therapy (Signed)
[facility] [address] [place], [place], [address] Phone: [phone]   Fax:  [phone]  Physical Therapy Evaluation  Patient Details  Name: [name] MRN: [id number] Date of Birth: October 29, 1950 Referring Ginnette Gates:  Jamal Maes, MD  Encounter Date: 02/18/2015      PT End of Session - 02/20/15 1110    Visit Number 1  G1   Number of Visits 9   Date for PT Re-Evaluation 03/20/15   Authorization Type [employer] Medicare   PT Start Time 8299   PT Stop Time 1447   PT Time Calculation (min) 42 min      Past Medical History  Diagnosis Date  . Coronary atherosclerosis of native coronary artery   . Other malaise and fatigue   . Chest pain, unspecified   . Other dyspnea and respiratory abnormality   . Personal history of unspecified circulatory disease   . Dysphagia, unspecified(787.20)   . Internal hemorrhoids without mention of complication   . Hypertrophy of prostate with urinary obstruction and other lower urinary tract symptoms (LUTS)   . Nausea alone   . Gastroparesis   . Backache, unspecified   . Posttraumatic stress disorder   . Hemorrhage of rectum and anus   . Allergy   . Diabetes mellitus 2004  . CHF (congestive heart failure)   . Hyperlipidemia   . Arthritis   . Dizziness   . Headache(784.0)   . Obesity   . Chills   . Complication of anesthesia     01/2011 could not eat,, hospt x2, was placed on hd and cleared up  . Myocardial infarction 2002  . Stroke     2007  . Unspecified essential hypertension     hx htn   . Chronic kidney disease, stage III (moderate)     HD T- TH-SAT  . Sleep apnea     USES CPAP   . Angina     LAST HAD CP 2 MO AGO  DR W[facility] [place]  . INTERNAL HEMORRHOIDS 10/16/2008    Qualifier: Diagnosis of  By: Nolon Rod CMA Deborra Medina), Shirlean Mylar      Past Surgical History  Procedure Laterality Date  . Hemorrhoid surgery    . Anal fissurectomy    . Av fistula placement    .  Revision of fistula      renal failure  . Cardiac catheterization      2005 DR BRODIE  . Ventral hernia repair  07/01/2011    Procedure: HERNIA REPAIR VENTRAL ADULT;  Surgeon: Joyice Faster. Cornett, MD;  Location: [facility];  Service: General;  Laterality: N/A;    There were no vitals filed for this visit.  Visit Diagnosis:  Abnormality of gait - Plan: PT plan of care cert/re-cert  Unsteadiness on feet - Plan: PT plan of care cert/re-cert      Subjective Assessment - 02/20/15 1058    Subjective Pt reports feeling of weakness and c/o balance problems with having had several falls; states he usually uses a cane for assist. with amb. but left in a hurry today for this appt and did not bring cane with him ; pt states he is on kidney recpipient list and does not want to have another fall "I have to stay well"  Pertinent History DM:  CHF:  Cardiac catherization 2005; back pain:  PTSD (does not want to work on step negotiation due to PTSD triggers); Peripheral neuropathy; chronic LBP with L5S1 radiculopathy; old CVA with no residual deficits   Patient Stated Goals improve balance and reduce the falls   Currently in Pain? Yes   Pain Score 8    Pain Location Back   Pain Orientation Lower   Pain Descriptors / Indicators Shooting;Sharp   Pain Type Chronic pain   Pain Onset More than a month ago   Pain Frequency Intermittent   Aggravating Factors  unsure   Pain Relieving Factors meditation and taking meds on schedule   Multiple Pain Sites No            [facility] PT Assessment - 02/20/15 0001    Assessment   Medical Diagnosis ESRD   Precautions   Precautions Fall   Balance Screen   Has the patient fallen in the past 6 months Yes   How many times? 4   Has the patient had  a decrease in activity level because of a fear of falling?  No   Is the patient reluctant to leave their home because of a fear of falling?  No   Home Environment   Living Environment Private residence   Type of Bruce Access Stairs to enter   Home Layout Two level   Alternate Level Stairs-Number of Steps 1   Alternate Level Stairs-Rails None   Home Equipment Walker - 4 wheels   Prior Function   Level of Independence Independent   Observation/Other Assessments   Focus on Therapeutic Outcomes (FOTO)  49/100; risk adjusted 46/100  physical fear 19 and ABC 21.5   Ambulation/Gait   Ambulation/Gait Yes   Ambulation/Gait Assistance 6: Modified independent (Device/Increase time)   Ambulation Distance (Feet) 100 Feet   Assistive device None   Gait Pattern Within Functional Limits   Ambulation Surface Level;Indoor   Gait velocity 2.86  11.47 secs no device   Stairs No  pt requests not to address due to PTSD trigger   Berg Balance Test   Sit to Stand Able to stand  independently using hands   Standing Unsupported Able to stand 2 minutes with supervision   Sitting with Back Unsupported but Feet Supported on Floor or Stool Able to sit safely and securely 2 minutes   Stand to Sit Controls descent by using hands   Transfers Able to transfer safely, definite need of hands   Standing Unsupported with Eyes Closed Able to stand 10 seconds with supervision   Standing Ubsupported with Feet Together Needs help to attain position and unable to hold for 15 seconds   From Standing, Reach Forward with Outstretched Arm Can reach confidently >25 cm (10")   From Standing Position, Pick up Object from Floor Unable to try/needs assist to keep balance   From Standing Position, Turn to Look Behind Over each Shoulder Turn sideways only but maintains balance   Turn 360 Degrees Needs close supervision or verbal cueing   Standing Unsupported, Alternately Place Feet on Step/Stool Able to complete >2  steps/needs minimal assist   Standing Unsupported, One Foot in Front Able to take small step independently and hold 30 seconds   Standing on One Leg Tries to lift leg/unable to hold 3 seconds but remains standing independently   Total Score 30   Timed Up and Go Test   Normal TUG (seconds) 12.43  PT Long Term Goals - 02/20/15 1117    PT LONG TERM GOAL #1   Title Improve Berg balance test score to >/= 35/56 to reduce fall risk.  (03-20-15)   Baseline 30/56   Time 4   Period Weeks   Status New   PT LONG TERM GOAL #2   Title Improve TUG score to </= 10 secs for imrpvoed mobility. (03-20-15)   Baseline 12.43 secs no device   Time 4   Period Weeks   Status New   PT LONG TERM GOAL #3   Title Incr. gait velocity to >/= 3.0 ft/sec with cane for incr. gait efficiency.  (03-20-15)   Baseline 2.86 ft/sec no device   Time 4   Period Weeks   Status New   PT LONG TERM GOAL #4   Title Independent in HEP for balance and strengthening exercises.  (03-20-15)   Time 4   Period Weeks   Status New               Plan - 02/20/15 1111    Clinical Impression Statement Pt is a 64 yr old gentleman with ESRD who states he started using a RW about 6 months ago, however is not using any asst. device today due to forgetting his cane; pt states feeling of general weakness with dialysis 3 days/week; pt reports neuropathy in feet with numbness in L foot;  pt states he walks a mile once a week with use of his cane                                                                                                                                                                                                                                                 Pt will benefit from skilled therapeutic intervention in order to improve on the following deficits Abnormal gait;Decreased activity tolerance;Decreased balance;Decreased mobility;Decreased strength;Impaired  sensation;Pain   Rehab Potential Good   PT Frequency 2x / week   PT Duration 4 weeks   PT Treatment/Interventions ADLs/Self Care Home Management;Functional mobility training;Stair training;Gait training;Therapeutic activities;Therapeutic exercise;Balance training;Neuromuscular re-education;Patient/family education   PT Next Visit Plan begin balance HEP and lower extremity strengthening exercises   PT Home Exercise Plan see above   Consulted and Agree with Plan of Care Patient         Problem List Patient Active Problem  List   Diagnosis Date Noted  . Orthostatic hypotension 01/15/2015  . Fall at home 01/15/2015  . 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 GERD (gastroesophageal reflux disease) 05/16/2014  . Trigger thumb of right hand 01/03/2014  . Left foot drop 05/02/2013  . Risk for falls 01/22/2012  . Lower extremity neuropathy 10/14/2010  . Gout 12/22/2009  . Obstructive sleep apnea 07/10/2009  . CAD -s/p DES to marginal vessel at [facility] 2014 04/09/2009  . ESRD on dialysis 02/27/2009  . BPH (benign prostatic hyperplasia) 09/25/2008  . Gastroparesis 06/19/2008  . Low back pain 12/15/2007  . Posttraumatic stress disorder 11/03/2007  . Diabetes mellitus with insulin therapy 09/05/2007  . Allergic rhinitis 03/01/2007  . Essential hypertension 01/17/2007  . CVA (cerebral infarction) 01/17/2007    Dilday, [place], PT 02/20/2015, 11:28 AM  [facility] [address] [place], [place], [address] Phone: [phone]   Fax:  [phone]

## 2015-02-25 ENCOUNTER — Ambulatory Visit: Payer: Medicare Other | Admitting: Physical Therapy

## 2015-02-27 ENCOUNTER — Ambulatory Visit: Payer: Medicare Other | Admitting: Physical Therapy

## 2015-03-01 ENCOUNTER — Ambulatory Visit: Payer: Self-pay | Admitting: Physical Therapy

## 2015-03-04 ENCOUNTER — Ambulatory Visit: Payer: Self-pay | Admitting: Physical Therapy

## 2015-03-06 ENCOUNTER — Ambulatory Visit: Payer: Self-pay | Admitting: Physical Therapy

## 2015-03-11 ENCOUNTER — Ambulatory Visit: Payer: Self-pay | Admitting: Physical Therapy

## 2015-03-13 ENCOUNTER — Ambulatory Visit: Payer: Self-pay | Admitting: Physical Therapy

## 2015-03-18 ENCOUNTER — Ambulatory Visit: Payer: Self-pay | Admitting: Physical Therapy

## 2015-03-20 ENCOUNTER — Ambulatory Visit: Payer: Self-pay | Admitting: Physical Therapy

## 2015-03-20 NOTE — Telephone Encounter (Signed)
Error

## 2015-03-25 ENCOUNTER — Ambulatory Visit: Payer: Self-pay | Admitting: Physical Therapy

## 2015-04-08 ENCOUNTER — Encounter: Payer: Self-pay | Admitting: Family Medicine

## 2015-04-08 ENCOUNTER — Ambulatory Visit (INDEPENDENT_AMBULATORY_CARE_PROVIDER_SITE_OTHER): Payer: Medicare Other | Admitting: Family Medicine

## 2015-04-08 VITALS — BP 122/74 | HR 78 | Temp 98.4°F | Wt 240.0 lb

## 2015-04-08 DIAGNOSIS — M7022 Olecranon bursitis, left elbow: Secondary | ICD-10-CM | POA: Diagnosis not present

## 2015-04-08 NOTE — Progress Notes (Signed)
Garret Reddish, MD  Subjective:  [name] is a 64 y.o. year old very pleasant male patient who presents for/with See problem oriented charting ROS- no shortness of breath, no chest pain. No erythema up or down arm.   Past Medical History-  Patient Active Problem List   Diagnosis Date Noted  . CAD -s/p DES to marginal vessel at [facility] 04/09/2009    Priority: High  . ESRD on dialysis [facility]) 02/27/2009    Priority: High  . Diabetes mellitus with insulin therapy ([place]) 09/05/2007    Priority: High  . Gout 12/22/2009    Priority: Medium  . Obstructive sleep apnea 07/10/2009    Priority: Medium  . BPH (benign prostatic hyperplasia) 09/25/2008    Priority: Medium  . Low back pain 12/15/2007    Priority: Medium  . Posttraumatic stress disorder 11/03/2007    Priority: Medium  . Essential hypertension 01/17/2007    Priority: Medium  . CVA (cerebral infarction) 01/17/2007    Priority: Medium  . GERD (gastroesophageal reflux disease) 05/16/2014    Priority: Low  . Trigger thumb of right hand 01/03/2014    Priority: Low  . Left foot drop 05/02/2013    Priority: Low  . Risk for falls 01/22/2012    Priority: Low  . Lower extremity neuropathy 10/14/2010    Priority: Low  . Gastroparesis 06/19/2008    Priority: Low  . Allergic rhinitis 03/01/2007    Priority: Low  . Orthostatic hypotension 01/15/2015  . Fall at home 01/15/2015  . 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 2014 10/23/2014    Medications- reviewed and updated Current Outpatient Prescriptions  Medication Sig Dispense Refill  . ACCU-CHEK SOFTCLIX LANCETS lancets Use three times daily 200 each 3  . allopurinol (ZYLOPRIM) 100 MG tablet Take 100 mg by mouth 2 (two) times daily.     [name] aspirin 81 MG tablet Take 81 mg by mouth at bedtime.     [name] atorvastatin (LIPITOR) 80 MG tablet Take 40 mg by mouth daily.    . calcium acetate (PHOSLO) 667 MG capsule Take 2,668 mg by mouth 3 (three) times daily with  meals.     . cinacalcet (SENSIPAR) 60 MG tablet Take 60 mg by mouth daily.     . clopidogrel (PLAVIX) 75 MG tablet Take 75 mg by mouth daily.     . diazepam (VALIUM) 5 MG tablet Take 1 tablet (5 mg total) by mouth at bedtime. 30 tablet 5  . gabapentin (NEURONTIN) 100 MG capsule Take 1 capsule (100 mg total) by mouth daily. 30 capsule 3  . glucose blood (ACCU-CHEK AVIVA PLUS) test strip Test three times daily 200 each 3  . insulin glargine (LANTUS) 100 UNIT/ML injection Inject 90 Units into the skin at bedtime.      . insulin lispro protamine-lispro (HUMALOG 75/25 MIX) (75-25) 100 UNIT/ML SUSP injection Inject 50 Units into the skin 2 (two) times daily with a meal. (Patient taking differently: Inject 15 Units into the skin 2 (two) times daily with a meal. ) 30 mL 12  . multivitamin (RENA-VIT) TABS tablet Take 1 tablet by mouth every morning.     [name] omeprazole (PRILOSEC) 20 MG capsule Take 20 mg by mouth daily.    . solifenacin (VESICARE) 5 MG tablet Take 5 mg by mouth every morning.     . Tamsulosin HCl (FLOMAX) 0.4 MG CAPS Take 0.4 mg by mouth every morning.     . topiramate (TOPAMAX) 100 MG tablet TAKE  1 TABLET (100 MG TOTAL) BY MOUTH 2 (TWO) TIMES DAILY. 90 tablet 1  . traMADol (ULTRAM) 50 MG tablet Take 1 tablet (50 mg total) by mouth every 8 (eight) hours as needed. For pain. Take everyday per patient (Patient taking differently: Take 50 mg by mouth every 8 (eight) hours as needed (takes with amoxicillin). For pain. Take everyday per patient) 30 tablet 1   No current facility-administered medications for this visit.    Objective: BP 122/74 mmHg  Pulse 78  Temp(Src) 98.4 F (36.9 C)  Wt 240 lb (108.863 kg) Gen: NAD, resting comfortably CV: RRR no murmurs rubs or gallops Lungs: CTAB no crackles, wheeze, rhonchi Abdomen: soft/nontender/nondistended/normal bowel sounds. No rebound or guarding.  Ext: 1+ edema pitting (dialysis tomorrow) Skin: warm, dry, no rash or erythema over elbow MSK:  mild swelling and effusion over left olecranon bursa. Mild to moderate tenderness to palpation.  Neuro:  using rolling walker  Assessment/Plan:  Olecranon bursitis of left elbow S: 4 days ago Pulled trash cans in with bedroom shoes and right after dialysis One of traschans hit back of his right heel and fell onto left arm Did not hit head. No presyncope or syncope.  Really sore in left arm and side a few days later.  Went to dialysis on Saturday and no issues with graft Left elbow with mild to moderate aching pain. Had apparently had issues years ago where had steroid injection but sounds like was more severe.  Pain getting slightly worse but not significantly.  No treatments have been tried A/P: No signs or symptoms of infection. Obvious traumatic cause of this. Discussed conservative measures with icing elbow x 3 days then switching to heat. Can use tylenol as needed for pain. Regardless- Return precautions advised. Patient does have history of gout but doubt gout given appearance. Also high risk patient on ESRD and has dialysis tomorrow

## 2015-04-08 NOTE — Patient Instructions (Addendum)
Would ice left elbow 3 x a day for 15-20 minutes Can use tylenol as needed for pain Might make sense to just take 650 mg with each meal for next 1-2 days then just use as needed  Elbow Bursitis A bursa is a fluid-filled sac that covers and protects a joint. Bursitis is when the fluid-filled sac gets puffy and sore (inflamed). Elbow bursitis, also called olecranon bursitis, happens over your elbow. This may be caused by:  Injury (acute trauma) to your elbow.   Leaning on hard surfaces for long periods of time.   Infection from an injury that breaks the skin near your elbow.  A bone growth (spur) that forms at the tip of your elbow.   A medical condition that causes inflammation in your body, such as:  Gout.  Rheumatoid arthritis. Sometimes the cause is not known. HOME CARE  Take medicines only as told by your doctor.  If you were prescribed an antibiotic medicine, finish all of it even if you start to feel better.   If your bursitis is caused by an injury, rest your elbow and wear your bandage as told by your doctor. You may also apply ice to the injured area as told by your doctor:   Put ice in a plastic bag.   Place a towel between your skin and the bag.   Leave the ice on for 20 minutes, 2-3 times per day.   Do not do any activities that cause pain to your elbow.  Use elbow pads or wraps to cushion your elbow.  GET HELP IF:  You have a fever.   Your symptoms do not get better with treatment.   Your pain or swelling gets worse.   Your pain or swelling goes away and then comes back.  You have drainage of pus from the swollen area over your elbow.   This information is not intended to replace advice given to you by your health care provider. Make sure you discuss any questions you have with your health care provider.   Document Released: 10/29/2009 Document Revised: 01/30/2015 Document Reviewed: 01/17/2014 Elsevier Interactive Patient Education [employer]

## 2015-04-09 NOTE — Telephone Encounter (Signed)
error 

## 2015-05-01 DIAGNOSIS — Z955 Presence of coronary angioplasty implant and graft: Secondary | ICD-10-CM | POA: Diagnosis not present

## 2015-05-01 DIAGNOSIS — Z7982 Long term (current) use of aspirin: Secondary | ICD-10-CM | POA: Diagnosis not present

## 2015-05-01 DIAGNOSIS — I712 Thoracic aortic aneurysm, without rupture: Secondary | ICD-10-CM | POA: Diagnosis not present

## 2015-05-01 DIAGNOSIS — I252 Old myocardial infarction: Secondary | ICD-10-CM | POA: Diagnosis not present

## 2015-05-01 DIAGNOSIS — I77810 Thoracic aortic ectasia: Secondary | ICD-10-CM | POA: Diagnosis not present

## 2015-05-01 DIAGNOSIS — I7100 Dissection of unspecified site of aorta: Secondary | ICD-10-CM | POA: Diagnosis not present

## 2015-05-01 DIAGNOSIS — I2510 Atherosclerotic heart disease of native coronary artery without angina pectoris: Secondary | ICD-10-CM | POA: Diagnosis not present

## 2015-05-15 DIAGNOSIS — Z95818 Presence of other cardiac implants and grafts: Secondary | ICD-10-CM | POA: Diagnosis not present

## 2015-05-15 DIAGNOSIS — Z992 Dependence on renal dialysis: Secondary | ICD-10-CM | POA: Diagnosis not present

## 2015-05-15 DIAGNOSIS — Q248 Other specified congenital malformations of heart: Secondary | ICD-10-CM | POA: Diagnosis not present

## 2015-05-15 DIAGNOSIS — N2889 Other specified disorders of kidney and ureter: Secondary | ICD-10-CM | POA: Diagnosis not present

## 2015-05-15 DIAGNOSIS — Z8673 Personal history of transient ischemic attack (TIA), and cerebral infarction without residual deficits: Secondary | ICD-10-CM | POA: Diagnosis not present

## 2015-05-15 DIAGNOSIS — N184 Chronic kidney disease, stage 4 (severe): Secondary | ICD-10-CM | POA: Diagnosis not present

## 2015-05-15 DIAGNOSIS — I252 Old myocardial infarction: Secondary | ICD-10-CM | POA: Diagnosis not present

## 2015-05-15 DIAGNOSIS — E1122 Type 2 diabetes mellitus with diabetic chronic kidney disease: Secondary | ICD-10-CM | POA: Diagnosis not present

## 2015-05-15 DIAGNOSIS — N281 Cyst of kidney, acquired: Secondary | ICD-10-CM | POA: Diagnosis not present

## 2015-05-15 DIAGNOSIS — Z7982 Long term (current) use of aspirin: Secondary | ICD-10-CM | POA: Diagnosis not present

## 2015-05-15 DIAGNOSIS — Z794 Long term (current) use of insulin: Secondary | ICD-10-CM | POA: Diagnosis not present

## 2015-05-15 DIAGNOSIS — Z01818 Encounter for other preprocedural examination: Secondary | ICD-10-CM | POA: Diagnosis not present

## 2015-05-15 DIAGNOSIS — Z79899 Other long term (current) drug therapy: Secondary | ICD-10-CM | POA: Diagnosis not present

## 2015-05-15 DIAGNOSIS — N186 End stage renal disease: Secondary | ICD-10-CM | POA: Diagnosis not present

## 2015-05-15 DIAGNOSIS — R972 Elevated prostate specific antigen [PSA]: Secondary | ICD-10-CM | POA: Diagnosis not present

## 2015-05-15 DIAGNOSIS — E785 Hyperlipidemia, unspecified: Secondary | ICD-10-CM | POA: Diagnosis not present

## 2015-05-15 DIAGNOSIS — I120 Hypertensive chronic kidney disease with stage 5 chronic kidney disease or end stage renal disease: Secondary | ICD-10-CM | POA: Diagnosis not present

## 2015-05-15 DIAGNOSIS — N261 Atrophy of kidney (terminal): Secondary | ICD-10-CM | POA: Diagnosis not present

## 2015-05-15 DIAGNOSIS — I2510 Atherosclerotic heart disease of native coronary artery without angina pectoris: Secondary | ICD-10-CM | POA: Diagnosis not present

## 2015-06-06 ENCOUNTER — Emergency Department (HOSPITAL_COMMUNITY): Payer: Medicare Other

## 2015-06-06 ENCOUNTER — Encounter (HOSPITAL_COMMUNITY): Payer: Self-pay

## 2015-06-06 ENCOUNTER — Emergency Department (HOSPITAL_COMMUNITY)
Admission: EM | Admit: 2015-06-06 | Discharge: 2015-06-06 | Disposition: A | Discharge status: Home / Self Care | Payer: Medicare Other | Attending: Emergency Medicine | Admitting: Emergency Medicine

## 2015-06-06 DIAGNOSIS — R0602 Shortness of breath: Secondary | ICD-10-CM | POA: Diagnosis not present

## 2015-06-06 DIAGNOSIS — I712 Thoracic aortic aneurysm, without rupture: Secondary | ICD-10-CM | POA: Diagnosis not present

## 2015-06-06 DIAGNOSIS — Z7982 Long term (current) use of aspirin: Secondary | ICD-10-CM | POA: Diagnosis not present

## 2015-06-06 DIAGNOSIS — I129 Hypertensive chronic kidney disease with stage 1 through stage 4 chronic kidney disease, or unspecified chronic kidney disease: Secondary | ICD-10-CM | POA: Insufficient documentation

## 2015-06-06 DIAGNOSIS — I25119 Atherosclerotic heart disease of native coronary artery with unspecified angina pectoris: Secondary | ICD-10-CM | POA: Insufficient documentation

## 2015-06-06 DIAGNOSIS — R51 Headache: Secondary | ICD-10-CM | POA: Insufficient documentation

## 2015-06-06 DIAGNOSIS — Z79899 Other long term (current) drug therapy: Secondary | ICD-10-CM | POA: Diagnosis not present

## 2015-06-06 DIAGNOSIS — R0789 Other chest pain: Secondary | ICD-10-CM | POA: Diagnosis not present

## 2015-06-06 DIAGNOSIS — G4730 Sleep apnea, unspecified: Secondary | ICD-10-CM | POA: Insufficient documentation

## 2015-06-06 DIAGNOSIS — N183 Chronic kidney disease, stage 3 (moderate): Secondary | ICD-10-CM | POA: Diagnosis not present

## 2015-06-06 DIAGNOSIS — E669 Obesity, unspecified: Secondary | ICD-10-CM | POA: Insufficient documentation

## 2015-06-06 DIAGNOSIS — E785 Hyperlipidemia, unspecified: Secondary | ICD-10-CM | POA: Diagnosis not present

## 2015-06-06 DIAGNOSIS — I509 Heart failure, unspecified: Secondary | ICD-10-CM | POA: Diagnosis not present

## 2015-06-06 DIAGNOSIS — I719 Aortic aneurysm of unspecified site, without rupture: Secondary | ICD-10-CM | POA: Diagnosis not present

## 2015-06-06 DIAGNOSIS — R079 Chest pain, unspecified: Secondary | ICD-10-CM | POA: Diagnosis not present

## 2015-06-06 DIAGNOSIS — I7121 Aneurysm of the ascending aorta, without rupture: Secondary | ICD-10-CM

## 2015-06-06 DIAGNOSIS — I252 Old myocardial infarction: Secondary | ICD-10-CM | POA: Diagnosis not present

## 2015-06-06 DIAGNOSIS — Z7902 Long term (current) use of antithrombotics/antiplatelets: Secondary | ICD-10-CM | POA: Diagnosis not present

## 2015-06-06 DIAGNOSIS — Z9981 Dependence on supplemental oxygen: Secondary | ICD-10-CM | POA: Insufficient documentation

## 2015-06-06 DIAGNOSIS — E119 Type 2 diabetes mellitus without complications: Secondary | ICD-10-CM | POA: Insufficient documentation

## 2015-06-06 LAB — CBC
HEMATOCRIT: 36.4 % — AB (ref 39.0–52.0)
Hemoglobin: 11.9 g/dL — ABNORMAL LOW (ref 13.0–17.0)
MCH: 29.2 pg (ref 26.0–34.0)
MCHC: 32.7 g/dL (ref 30.0–36.0)
MCV: 89.2 fL (ref 78.0–100.0)
Platelets: 202 10*3/uL (ref 150–400)
RBC: 4.08 MIL/uL — ABNORMAL LOW (ref 4.22–5.81)
RDW: 13.8 % (ref 11.5–15.5)
WBC: 4.4 10*3/uL (ref 4.0–10.5)

## 2015-06-06 LAB — BASIC METABOLIC PANEL
Anion gap: 16 — ABNORMAL HIGH (ref 5–15)
BUN: 35 mg/dL — AB (ref 6–20)
CALCIUM: 9.6 mg/dL (ref 8.9–10.3)
CO2: 24 mmol/L (ref 22–32)
Chloride: 99 mmol/L — ABNORMAL LOW (ref 101–111)
Creatinine, Ser: 9.59 mg/dL — ABNORMAL HIGH (ref 0.61–1.24)
GFR calc Af Amer: 6 mL/min — ABNORMAL LOW (ref >60)
GFR, EST NON AFRICAN AMERICAN: 5 mL/min — AB (ref >60)
GLUCOSE: 136 mg/dL — AB (ref 65–99)
POTASSIUM: 4.4 mmol/L (ref 3.5–5.1)
Sodium: 139 mmol/L (ref 135–145)

## 2015-06-06 LAB — I-STAT TROPONIN, ED
TROPONIN I, POC: 0.03 ng/mL (ref 0.00–0.08)
Troponin i, poc: 0.04 ng/mL (ref 0.00–0.08)

## 2015-06-06 LAB — D-DIMER, QUANTITATIVE (NOT AT ARMC): D DIMER QUANT: 0.54 ug{FEU}/mL — AB (ref 0.00–0.50)

## 2015-06-06 MED ORDER — IOHEXOL 350 MG/ML SOLN
100.0000 mL | Freq: Once | INTRAVENOUS | Status: AC | PRN
  Administered 2015-06-06: 100 mL via INTRAVENOUS

## 2015-06-06 MED ORDER — TRAMADOL HCL 50 MG PO TABS
50.0000 mg | ORAL_TABLET | Freq: Once | ORAL | Status: AC
  Administered 2015-06-06: 50 mg via ORAL
  Filled 2015-06-06: qty 1

## 2015-06-06 NOTE — ED Notes (Signed)
Pt states he started having swelling to the left leg earlier yesterday and it is bigger than his right. Has been on his feet shoveling snow. Also reports a headache and some chest pain after arguing at home. Gets dialysis on T, Th, and Sat and will get it tomorrow.

## 2015-06-06 NOTE — ED Notes (Signed)
Transported patient to CT.

## 2015-06-06 NOTE — ED Notes (Signed)
Patient resting at this time, watching television

## 2015-06-06 NOTE — ED Provider Notes (Signed)
CSN: [id number]     Arrival date & time 06/06/15  0056 History  By signing my name below, I, Altamease Oiler, attest that this documentation has been prepared under the direction and in the presence of Merryl Hacker, MD. Electronically Signed: Altamease Oiler, ED Scribe. 06/06/2015. 3:40 AM   Chief Complaint  Patient presents with  . Chest Pain    The history is provided by the patient. No language interpreter was used.   [name] is a 65 y.o. 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 Denies any ripping or tearing nature to the pain. Episode self-limited. Currently chest pain-free. Associated symptoms include headache and an episode of SOB that has resolved. Pt also complains of left leg pain and swelling since yesterday afternoon. He denies injuring the leg but notes sitting awkwardly in his chair while working for 3 hours and shoveling snow afterwards. Reports pain is 7 out of 10. Denies recent hospitalization, long travel, injury.  Past Medical History  Diagnosis Date  . Coronary atherosclerosis of native coronary artery   . Other malaise and fatigue   . Chest pain, unspecified   . Other dyspnea and respiratory abnormality   . Personal history of unspecified circulatory disease   . Dysphagia, unspecified(787.20)   . Internal hemorrhoids without mention of complication   . Hypertrophy of prostate with urinary obstruction and other lower urinary tract symptoms (LUTS)   . Nausea alone   . Gastroparesis   . Backache, unspecified   . Posttraumatic stress disorder   . Hemorrhage of rectum and anus   . Allergy   . Diabetes mellitus 2004  . CHF (congestive heart failure) ([place])   . Hyperlipidemia   . Arthritis   . Dizziness   . Headache(784.0)   . Obesity   . Chills    . Complication of anesthesia     01/2011 could not eat,, hospt x2, was placed on hd and cleared up  . Myocardial infarction ([place]) 2002  . Stroke [facility])     2007  . Unspecified essential hypertension     hx htn   . Chronic kidney disease, stage III (moderate)     HD T- TH-SAT  . Sleep apnea     USES CPAP   . Angina     LAST HAD CP 2 MO AGO  DR WALL [place]  . INTERNAL HEMORRHOIDS 10/16/2008    Qualifier: Diagnosis of  By: Nolon Rod CMA Deborra Medina), Shirlean Mylar     Past Surgical History  Procedure Laterality Date  . Hemorrhoid surgery    . Anal fissurectomy    . Av fistula placement    . Revision of fistula      renal failure  . Cardiac catheterization      2005 DR BRODIE  . Ventral hernia repair  07/01/2011    Procedure: HERNIA REPAIR VENTRAL ADULT;  Surgeon: Joyice Faster. Cornett, MD;  Location: [place] OR;  Service: General;  Laterality: N/A;   Family History  Problem Relation Age of Onset  . Diabetes Sister   . Heart disease Sister   . Diabetes Maternal Aunt   . Diabetes Maternal Uncle   . Diabetes Paternal Aunt   . Diabetes Paternal Uncle   . Heart disease    . Heart disease Father   .  Renal Disease Father   . Heart disease Brother   . Dementia Mother    Social History  Substance Use Topics  . Smoking status: Never Smoker   . Smokeless tobacco: Never Used  . Alcohol Use: No    Review of Systems  Constitutional: Negative for fever and chills.  Respiratory: Positive for shortness of breath. Negative for cough.   Cardiovascular: Positive for chest pain.  Musculoskeletal:       Left leg pain and swelling  Skin: Negative for wound.  Neurological: Positive for headaches.  All other systems reviewed and are negative.  Allergies  Review of patient's allergies indicates no known allergies.  Home Medications   Prior to Admission medications   Medication Sig Start Date End Date Taking? Authorizing Provider  ACCU-CHEK SOFTCLIX LANCETS lancets Use three times daily 11/16/13  Yes Ricard Dillon, MD  allopurinol (ZYLOPRIM) 100 MG tablet Take 100 mg by mouth 2 (two) times daily.    Yes Historical Provider, MD  aspirin 81 MG tablet Take 81 mg by mouth at bedtime.    Yes Historical Provider, MD  atorvastatin (LIPITOR) 80 MG tablet Take 40 mg by mouth daily.   Yes Historical Provider, MD  calcium acetate (PHOSLO) 667 MG capsule Take 2,668 mg by mouth 3 (three) times daily with meals.    Yes Historical Provider, MD  cinacalcet (SENSIPAR) 60 MG tablet Take 60 mg by mouth every other day.    Yes Historical Provider, MD  clopidogrel (PLAVIX) 75 MG tablet Take 75 mg by mouth daily.    Yes Historical Provider, MD  diazepam (VALIUM) 5 MG tablet Take 1 tablet (5 mg total) by mouth at bedtime. 05/16/14  Yes [name], MD  gabapentin (NEURONTIN) 100 MG capsule Take 1 capsule (100 mg total) by mouth daily. 01/21/15  Yes [name], MD  glucose blood (ACCU-CHEK AVIVA PLUS) test strip Test three times daily 11/16/13  Yes Ricard Dillon, MD  insulin glargine (LANTUS) 100 UNIT/ML injection Inject 90 Units into the skin at bedtime.     Yes Historical Provider, MD  insulin lispro protamine-lispro (HUMALOG 75/25 MIX) (75-25) 100 UNIT/ML SUSP injection Inject 50 Units into the skin 2 (two) times daily with a meal. Patient taking differently: Inject 15 Units into the skin 2 (two) times daily with a meal.  07/21/13  Yes Ricard Dillon, MD  multivitamin (RENA-VIT) TABS tablet Take 1 tablet by mouth every morning.    Yes Historical Provider, MD  omeprazole (PRILOSEC) 20 MG capsule Take 20 mg by mouth daily.   Yes Historical Provider, MD  solifenacin (VESICARE) 5 MG tablet Take 5 mg by mouth every morning.    Yes Historical Provider, MD  Tamsulosin HCl (FLOMAX) 0.4 MG CAPS Take 0.4 mg by mouth every morning.    Yes Historical Provider, MD  topiramate (TOPAMAX) 100 MG tablet TAKE 1 TABLET (100 MG TOTAL) BY MOUTH 2 (TWO) TIMES DAILY. Patient taking differently: Take 100 mg by mouth daily.  08/22/14   Yes [name], MD  traMADol (ULTRAM) 50 MG tablet Take 1 tablet (50 mg total) by mouth every 8 (eight) hours as needed. For pain. Take everyday per patient Patient taking differently: Take 50 mg by mouth every 8 (eight) hours as needed (takes with amoxicillin). For pain. Take everyday per patient 07/25/14  Yes [name], MD   BP 120/75 mmHg  Pulse 86  Temp(Src) 98.8 F (37.1 C) (Oral)  Resp 18  SpO2  100% Physical Exam  Constitutional: He is oriented to person, place, and time. He appears well-developed and well-nourished. No distress.  HENT:  Head: Normocephalic and atraumatic.  Cardiovascular: Normal rate, regular rhythm and normal heart sounds.   No murmur heard. Fistula left upper extremity with positive thrill  Pulmonary/Chest: Effort normal and breath sounds normal. No respiratory distress. He has no wheezes. He has no rales.  Abdominal: Soft. Bowel sounds are normal. There is no tenderness. There is no rebound.  Musculoskeletal: He exhibits edema.  2+ bilateral lower extremity pitting edema, left greater than right  Neurological: He is alert and oriented to person, place, and time.  Skin: Skin is warm and dry.  Psychiatric: He has a normal mood and affect.  Nursing note and vitals reviewed.   ED Course  Procedures (including critical care time) DIAGNOSTIC STUDIES: Oxygen Saturation is 100% on RA,  normal by my interpretation.    COORDINATION OF CARE: 3:35 AM Discussed treatment plan which includes lab work, CXR, EKG, and tramadol with pt at bedside and pt agreed to plan.  Labs Review Labs Reviewed  BASIC METABOLIC PANEL - Abnormal; Notable for the following:    Chloride 99 (*)    Glucose, Bld 136 (*)    BUN 35 (*)    Creatinine, Ser 9.59 (*)    GFR calc non Af Amer 5 (*)    GFR calc Af Amer 6 (*)    Anion gap 16 (*)    All other components within normal limits  CBC - Abnormal; Notable for the following:    RBC 4.08 (*)    Hemoglobin 11.9 (*)    HCT  36.4 (*)    All other components within normal limits  D-DIMER, QUANTITATIVE (NOT AT [facility]) - Abnormal; Notable for the following:    D-Dimer, Quant 0.54 (*)    All other components within normal limits  I-STAT TROPOININ, ED  Randolm Idol, ED    Imaging Review Dg Chest 2 View  06/06/2015  CLINICAL DATA:  65 year old male with chest pain and shortness of breath EXAM: CHEST  2 VIEW COMPARISON:  Radiograph dated 10/23/2014 FINDINGS: Two views of the chest do not demonstrate a focal consolidation. There is no pleural effusion or pneumothorax. Minimal bibasilar atelectatic changes. Stable cardiac silhouette. The osseous structures appear unremarkable with IMPRESSION: No active cardiopulmonary disease. Electronically Signed   By: Anner Crete M.D.   On: 06/06/2015 02:22   Ct Angio Chest Pe W/cm &/or Wo Cm  06/06/2015  ADDENDUM REPORT: 06/06/2015 07:21 ADDENDUM: Please note there is no evidence of aortic dissection on this study. However, evaluation of the aorta is somewhat limited due to suboptimal opacification of the aorta as this study was performed with PE protocol. If there is high clinical suspicion for aortic dissection a CT aortogram is recommended for better evaluation. Electronically Signed   By: Anner Crete M.D.   On: 06/06/2015 07:21  06/06/2015  CLINICAL DATA:  65 year old male with shortness of breath and positive D-dimer. EXAM: CT ANGIOGRAPHY CHEST WITH CONTRAST TECHNIQUE: Multidetector CT imaging of the chest was performed using the standard protocol during bolus administration of intravenous contrast. Multiplanar CT image reconstructions and MIPs were obtained to evaluate the vascular anatomy. CONTRAST:  151mL OMNIPAQUE IOHEXOL 350 MG/ML SOLN COMPARISON:  Chest radiograph dated 06/06/2015 FINDINGS: The lungs are clear. No pleural effusion or pneumothorax. The central airways are patent. There is aneurysmal dilatation of the ascending aorta measuring up to 4.7 cm in diameter. No  CT evidence of pulmonary embolism. Top-normal cardiac size. No pericardial effusion. There is coronary vascular calcification. There is no hilar or mediastinal adenopathy. The esophagus is grossly unremarkable. There is asymmetric prominence of the right thyroid lobe. The chest wall soft tissues appear unremarkable. No axillary adenopathy. Mild degenerative changes of the spine. The osseous structures are otherwise intact. Colonic diverticulosis. A 3 mm vascular calcification versus nonobstructing right renal calculus. The visualized upper abdomen is otherwise unremarkable. Review of the MIP images confirms the above findings. IMPRESSION: No CT evidence of pulmonary embolism. A 4.7 cm aneurysmal dilatation of the ascending aorta. Recommend semi-annual imaging followup by CTA or MRA and referral to cardiothoracic surgery if not already obtained. This recommendation follows 2010 ACCF/AHA/AATS/ACR/ASA/SCA/SCAI/SIR/STS/SVM Guidelines for the Diagnosis and Management of Patients With Thoracic Aortic Disease. Circulation. 2010; [id number] Electronically Signed: By: Anner Crete M.D. On: 06/06/2015 06:51   I have personally reviewed and evaluated these images and lab results as part of my medical decision-making.   EKG Interpretation   Date/Time:  Thursday June 06 2015 01:01:55 EST Ventricular Rate:  91 PR Interval:  180 QRS Duration: 86 QT Interval:  372 QTC Calculation: 457 R Axis:   -24 Text Interpretation:  Normal sinus rhythm Low voltage QRS Cannot rule out  Anterior infarct , age undetermined T wave abnormality, consider lateral  ischemia Abnormal ECG Confirmed by HORTON  MD, COURTNEY ([id number]) on  06/06/2015 3:20:21 AM      MDM   Final diagnoses:  Other chest pain  Ascending aortic aneurysm ([place])    Patient presents with leg pain, swelling, and limited episode of chest pain. He does have a history of coronary artery disease. Is currently chest pain-free. Pain started at 10 PM. No  other risk factors for DVT. Initial EKG without change and no evidence of acute ischemia. Troponin, d-dimer, and basic labwork sent. Initial troponin 0.04. D-dimer mildly elevated at 0.53. Given leg swelling, we'll send for PE study. Patient is due for dialysis later today and will have contrast dialyzed off.  Initial troponin was obtained a proximally 4 hours after chest pain onset. This is negative. Patient has been in the waiting room and was not seen until approximately 6 hours after chest pain onset and continues to be chest pain-free. While he has a history of coronary artery disease, feel at this time his pain is somewhat atypical and will get a delta troponin.  Repeat troponin at 8 1/2 hours after onset of pain is negative. CT PE protocol negative for PE but does show aneurysmal dilation of the ascending aorta to 4.7 cm. Patient is in no acute distress and having no pain at this time. Discussed with CT surgery Dr. Servando Snare who requested the patient have a definitive study to evaluate for dissection. Discussed with radiologist. While there is no evidence of dissection on the PE protocol study, this cannot be ruled out 100%. While I have low clinical suspicion, repeat CT was ordered to rule out dissection. Given that the patient is on dialysis, feel he can have repeat contrast load.  Study is pending. This is signed out to Dr. Tyrone Nine. Discussed the diagnosis and follow-up with the patient. He will need to follow-up with cardiology and cardiothoracic surgery.  He is due for dialysis and will need to proceed there if he is discharged. No indication for emergent dialysis at this time.  I personally performed the services described in this documentation, which was scribed in my presence. The recorded information has been  reviewed and is accurate.    Merryl Hacker, MD 06/06/15 [phone]

## 2015-06-22 DIAGNOSIS — Z79899 Other long term (current) drug therapy: Secondary | ICD-10-CM | POA: Diagnosis not present

## 2015-06-22 DIAGNOSIS — Z955 Presence of coronary angioplasty implant and graft: Secondary | ICD-10-CM | POA: Diagnosis not present

## 2015-06-22 DIAGNOSIS — Z7982 Long term (current) use of aspirin: Secondary | ICD-10-CM | POA: Diagnosis not present

## 2015-06-22 DIAGNOSIS — Z5181 Encounter for therapeutic drug level monitoring: Secondary | ICD-10-CM | POA: Diagnosis not present

## 2015-06-22 DIAGNOSIS — E119 Type 2 diabetes mellitus without complications: Secondary | ICD-10-CM | POA: Diagnosis not present

## 2015-06-22 DIAGNOSIS — N419 Inflammatory disease of prostate, unspecified: Secondary | ICD-10-CM | POA: Diagnosis not present

## 2015-06-22 DIAGNOSIS — D62 Acute posthemorrhagic anemia: Secondary | ICD-10-CM | POA: Diagnosis not present

## 2015-06-22 DIAGNOSIS — N186 End stage renal disease: Secondary | ICD-10-CM | POA: Diagnosis not present

## 2015-06-22 DIAGNOSIS — E1029 Type 1 diabetes mellitus with other diabetic kidney complication: Secondary | ICD-10-CM | POA: Diagnosis not present

## 2015-06-22 DIAGNOSIS — I2510 Atherosclerotic heart disease of native coronary artery without angina pectoris: Secondary | ICD-10-CM | POA: Diagnosis not present

## 2015-06-22 DIAGNOSIS — Z87442 Personal history of urinary calculi: Secondary | ICD-10-CM | POA: Diagnosis not present

## 2015-06-22 DIAGNOSIS — M109 Gout, unspecified: Secondary | ICD-10-CM | POA: Diagnosis not present

## 2015-06-22 DIAGNOSIS — G629 Polyneuropathy, unspecified: Secondary | ICD-10-CM | POA: Diagnosis not present

## 2015-06-22 DIAGNOSIS — Z452 Encounter for adjustment and management of vascular access device: Secondary | ICD-10-CM | POA: Diagnosis not present

## 2015-06-22 DIAGNOSIS — Z940 Kidney transplant status: Secondary | ICD-10-CM | POA: Diagnosis not present

## 2015-06-22 DIAGNOSIS — Z833 Family history of diabetes mellitus: Secondary | ICD-10-CM | POA: Diagnosis not present

## 2015-06-22 DIAGNOSIS — M7989 Other specified soft tissue disorders: Secondary | ICD-10-CM | POA: Diagnosis not present

## 2015-06-22 DIAGNOSIS — E873 Alkalosis: Secondary | ICD-10-CM | POA: Diagnosis not present

## 2015-06-22 DIAGNOSIS — Z794 Long term (current) use of insulin: Secondary | ICD-10-CM | POA: Diagnosis not present

## 2015-06-22 DIAGNOSIS — I10 Essential (primary) hypertension: Secondary | ICD-10-CM | POA: Diagnosis not present

## 2015-06-22 DIAGNOSIS — I252 Old myocardial infarction: Secondary | ICD-10-CM | POA: Diagnosis not present

## 2015-06-22 DIAGNOSIS — Z841 Family history of disorders of kidney and ureter: Secondary | ICD-10-CM | POA: Diagnosis not present

## 2015-06-22 DIAGNOSIS — I120 Hypertensive chronic kidney disease with stage 5 chronic kidney disease or end stage renal disease: Secondary | ICD-10-CM | POA: Diagnosis not present

## 2015-06-22 DIAGNOSIS — Z9181 History of falling: Secondary | ICD-10-CM | POA: Diagnosis not present

## 2015-06-22 DIAGNOSIS — Z4822 Encounter for aftercare following kidney transplant: Secondary | ICD-10-CM | POA: Diagnosis not present

## 2015-06-22 DIAGNOSIS — R000 Tachycardia, unspecified: Secondary | ICD-10-CM | POA: Diagnosis not present

## 2015-06-22 DIAGNOSIS — E1122 Type 2 diabetes mellitus with diabetic chronic kidney disease: Secondary | ICD-10-CM | POA: Diagnosis not present

## 2015-06-22 DIAGNOSIS — G4733 Obstructive sleep apnea (adult) (pediatric): Secondary | ICD-10-CM | POA: Diagnosis not present

## 2015-06-22 DIAGNOSIS — E785 Hyperlipidemia, unspecified: Secondary | ICD-10-CM | POA: Diagnosis not present

## 2015-06-22 DIAGNOSIS — Z8249 Family history of ischemic heart disease and other diseases of the circulatory system: Secondary | ICD-10-CM | POA: Diagnosis not present

## 2015-06-22 DIAGNOSIS — Z992 Dependence on renal dialysis: Secondary | ICD-10-CM | POA: Diagnosis not present

## 2015-06-27 DIAGNOSIS — D849 Immunodeficiency, unspecified: Secondary | ICD-10-CM | POA: Insufficient documentation

## 2015-06-27 DIAGNOSIS — Z940 Kidney transplant status: Secondary | ICD-10-CM | POA: Insufficient documentation

## 2015-06-27 LAB — BASIC METABOLIC PANEL
BUN: 50 mg/dL — AB (ref 4–21)
CREATININE: 3.5 mg/dL — AB (ref 0.6–1.3)
Glucose: 80 mg/dL
POTASSIUM: 3.7 mmol/L (ref 3.4–5.3)
Sodium: 138 mmol/L (ref 137–147)

## 2015-06-27 LAB — CBC AND DIFFERENTIAL
HEMATOCRIT: 34 % — AB (ref 41–53)
Hemoglobin: 10.9 g/dL — AB (ref 13.5–17.5)
WBC: 5.1 10*3/mL

## 2015-06-28 ENCOUNTER — Encounter: Payer: Self-pay | Admitting: Family Medicine

## 2015-07-01 DIAGNOSIS — I25110 Atherosclerotic heart disease of native coronary artery with unstable angina pectoris: Secondary | ICD-10-CM | POA: Diagnosis not present

## 2015-07-01 DIAGNOSIS — D649 Anemia, unspecified: Secondary | ICD-10-CM | POA: Diagnosis not present

## 2015-07-01 DIAGNOSIS — Z79899 Other long term (current) drug therapy: Secondary | ICD-10-CM | POA: Diagnosis not present

## 2015-07-01 DIAGNOSIS — Z4822 Encounter for aftercare following kidney transplant: Secondary | ICD-10-CM | POA: Diagnosis not present

## 2015-07-01 DIAGNOSIS — D899 Disorder involving the immune mechanism, unspecified: Secondary | ICD-10-CM | POA: Diagnosis not present

## 2015-07-01 DIAGNOSIS — I120 Hypertensive chronic kidney disease with stage 5 chronic kidney disease or end stage renal disease: Secondary | ICD-10-CM | POA: Diagnosis not present

## 2015-07-01 DIAGNOSIS — E785 Hyperlipidemia, unspecified: Secondary | ICD-10-CM | POA: Diagnosis not present

## 2015-07-01 DIAGNOSIS — Z7982 Long term (current) use of aspirin: Secondary | ICD-10-CM | POA: Diagnosis not present

## 2015-07-01 DIAGNOSIS — Z940 Kidney transplant status: Secondary | ICD-10-CM | POA: Diagnosis not present

## 2015-07-01 DIAGNOSIS — Z794 Long term (current) use of insulin: Secondary | ICD-10-CM | POA: Diagnosis not present

## 2015-07-01 DIAGNOSIS — N186 End stage renal disease: Secondary | ICD-10-CM | POA: Diagnosis not present

## 2015-07-01 DIAGNOSIS — M109 Gout, unspecified: Secondary | ICD-10-CM | POA: Diagnosis not present

## 2015-07-01 DIAGNOSIS — E1122 Type 2 diabetes mellitus with diabetic chronic kidney disease: Secondary | ICD-10-CM | POA: Diagnosis not present

## 2015-07-01 DIAGNOSIS — Z7952 Long term (current) use of systemic steroids: Secondary | ICD-10-CM | POA: Diagnosis not present

## 2015-07-01 DIAGNOSIS — Z4802 Encounter for removal of sutures: Secondary | ICD-10-CM | POA: Diagnosis not present

## 2015-07-01 DIAGNOSIS — D8989 Other specified disorders involving the immune mechanism, not elsewhere classified: Secondary | ICD-10-CM | POA: Diagnosis not present

## 2015-07-01 DIAGNOSIS — Z8673 Personal history of transient ischemic attack (TIA), and cerebral infarction without residual deficits: Secondary | ICD-10-CM | POA: Diagnosis not present

## 2015-07-03 ENCOUNTER — Other Ambulatory Visit: Payer: Self-pay | Admitting: Family Medicine

## 2015-07-03 NOTE — Telephone Encounter (Signed)
Patient needs his Glucose Accu Chek Aviva Plus strips and Diazepam 5mg  prescription refilled at the [facility] on [address].

## 2015-07-04 DIAGNOSIS — Z955 Presence of coronary angioplasty implant and graft: Secondary | ICD-10-CM | POA: Diagnosis not present

## 2015-07-04 DIAGNOSIS — E1122 Type 2 diabetes mellitus with diabetic chronic kidney disease: Secondary | ICD-10-CM | POA: Diagnosis not present

## 2015-07-04 DIAGNOSIS — Z4822 Encounter for aftercare following kidney transplant: Secondary | ICD-10-CM | POA: Diagnosis not present

## 2015-07-04 DIAGNOSIS — D899 Disorder involving the immune mechanism, unspecified: Secondary | ICD-10-CM | POA: Diagnosis not present

## 2015-07-04 DIAGNOSIS — Z7982 Long term (current) use of aspirin: Secondary | ICD-10-CM | POA: Diagnosis not present

## 2015-07-04 DIAGNOSIS — D649 Anemia, unspecified: Secondary | ICD-10-CM | POA: Diagnosis not present

## 2015-07-04 DIAGNOSIS — Z79899 Other long term (current) drug therapy: Secondary | ICD-10-CM | POA: Diagnosis not present

## 2015-07-04 DIAGNOSIS — Z8673 Personal history of transient ischemic attack (TIA), and cerebral infarction without residual deficits: Secondary | ICD-10-CM | POA: Diagnosis not present

## 2015-07-04 DIAGNOSIS — M109 Gout, unspecified: Secondary | ICD-10-CM | POA: Diagnosis not present

## 2015-07-04 DIAGNOSIS — E785 Hyperlipidemia, unspecified: Secondary | ICD-10-CM | POA: Diagnosis not present

## 2015-07-04 DIAGNOSIS — I2510 Atherosclerotic heart disease of native coronary artery without angina pectoris: Secondary | ICD-10-CM | POA: Diagnosis not present

## 2015-07-04 DIAGNOSIS — N186 End stage renal disease: Secondary | ICD-10-CM | POA: Diagnosis not present

## 2015-07-04 DIAGNOSIS — Z9889 Other specified postprocedural states: Secondary | ICD-10-CM | POA: Diagnosis not present

## 2015-07-04 DIAGNOSIS — E1165 Type 2 diabetes mellitus with hyperglycemia: Secondary | ICD-10-CM | POA: Diagnosis not present

## 2015-07-04 DIAGNOSIS — G629 Polyneuropathy, unspecified: Secondary | ICD-10-CM | POA: Diagnosis not present

## 2015-07-04 DIAGNOSIS — Z794 Long term (current) use of insulin: Secondary | ICD-10-CM | POA: Diagnosis not present

## 2015-07-04 DIAGNOSIS — Z940 Kidney transplant status: Secondary | ICD-10-CM | POA: Diagnosis not present

## 2015-07-04 DIAGNOSIS — Z7902 Long term (current) use of antithrombotics/antiplatelets: Secondary | ICD-10-CM | POA: Diagnosis not present

## 2015-07-04 DIAGNOSIS — I120 Hypertensive chronic kidney disease with stage 5 chronic kidney disease or end stage renal disease: Secondary | ICD-10-CM | POA: Diagnosis not present

## 2015-07-04 MED ORDER — GLUCOSE BLOOD VI STRP: ORAL_STRIP | Status: DC

## 2015-07-04 MED ORDER — DIAZEPAM 5 MG PO TABS: 5.0000 mg | ORAL_TABLET | Freq: Every day | ORAL | Status: DC

## 2015-07-04 NOTE — Telephone Encounter (Signed)
Yes thanks may refill both

## 2015-07-04 NOTE — Telephone Encounter (Signed)
Rxs done. 

## 2015-07-08 DIAGNOSIS — D649 Anemia, unspecified: Secondary | ICD-10-CM | POA: Diagnosis not present

## 2015-07-08 DIAGNOSIS — Z794 Long term (current) use of insulin: Secondary | ICD-10-CM | POA: Diagnosis not present

## 2015-07-08 DIAGNOSIS — E1122 Type 2 diabetes mellitus with diabetic chronic kidney disease: Secondary | ICD-10-CM | POA: Diagnosis not present

## 2015-07-08 DIAGNOSIS — I2510 Atherosclerotic heart disease of native coronary artery without angina pectoris: Secondary | ICD-10-CM | POA: Diagnosis not present

## 2015-07-08 DIAGNOSIS — Z940 Kidney transplant status: Secondary | ICD-10-CM | POA: Diagnosis not present

## 2015-07-08 DIAGNOSIS — E785 Hyperlipidemia, unspecified: Secondary | ICD-10-CM | POA: Diagnosis not present

## 2015-07-08 DIAGNOSIS — N186 End stage renal disease: Secondary | ICD-10-CM | POA: Diagnosis not present

## 2015-07-08 DIAGNOSIS — R600 Localized edema: Secondary | ICD-10-CM | POA: Diagnosis not present

## 2015-07-08 DIAGNOSIS — Z79899 Other long term (current) drug therapy: Secondary | ICD-10-CM | POA: Diagnosis not present

## 2015-07-08 DIAGNOSIS — E119 Type 2 diabetes mellitus without complications: Secondary | ICD-10-CM | POA: Diagnosis not present

## 2015-07-08 DIAGNOSIS — M109 Gout, unspecified: Secondary | ICD-10-CM | POA: Diagnosis not present

## 2015-07-08 DIAGNOSIS — I120 Hypertensive chronic kidney disease with stage 5 chronic kidney disease or end stage renal disease: Secondary | ICD-10-CM | POA: Diagnosis not present

## 2015-07-08 DIAGNOSIS — Z7982 Long term (current) use of aspirin: Secondary | ICD-10-CM | POA: Diagnosis not present

## 2015-07-08 DIAGNOSIS — Z4822 Encounter for aftercare following kidney transplant: Secondary | ICD-10-CM | POA: Diagnosis not present

## 2015-07-08 DIAGNOSIS — D899 Disorder involving the immune mechanism, unspecified: Secondary | ICD-10-CM | POA: Diagnosis not present

## 2015-07-12 DIAGNOSIS — E1122 Type 2 diabetes mellitus with diabetic chronic kidney disease: Secondary | ICD-10-CM | POA: Diagnosis not present

## 2015-07-12 DIAGNOSIS — I2510 Atherosclerotic heart disease of native coronary artery without angina pectoris: Secondary | ICD-10-CM | POA: Diagnosis not present

## 2015-07-12 DIAGNOSIS — E785 Hyperlipidemia, unspecified: Secondary | ICD-10-CM | POA: Diagnosis not present

## 2015-07-12 DIAGNOSIS — Z4822 Encounter for aftercare following kidney transplant: Secondary | ICD-10-CM | POA: Diagnosis not present

## 2015-07-12 DIAGNOSIS — Z7982 Long term (current) use of aspirin: Secondary | ICD-10-CM | POA: Diagnosis not present

## 2015-07-12 DIAGNOSIS — D649 Anemia, unspecified: Secondary | ICD-10-CM | POA: Diagnosis not present

## 2015-07-12 DIAGNOSIS — E8770 Fluid overload, unspecified: Secondary | ICD-10-CM | POA: Insufficient documentation

## 2015-07-12 DIAGNOSIS — Z940 Kidney transplant status: Secondary | ICD-10-CM | POA: Diagnosis not present

## 2015-07-12 DIAGNOSIS — I10 Essential (primary) hypertension: Secondary | ICD-10-CM | POA: Diagnosis not present

## 2015-07-12 DIAGNOSIS — G629 Polyneuropathy, unspecified: Secondary | ICD-10-CM | POA: Diagnosis not present

## 2015-07-12 DIAGNOSIS — Z794 Long term (current) use of insulin: Secondary | ICD-10-CM | POA: Diagnosis not present

## 2015-07-12 DIAGNOSIS — Z8673 Personal history of transient ischemic attack (TIA), and cerebral infarction without residual deficits: Secondary | ICD-10-CM | POA: Diagnosis not present

## 2015-07-12 DIAGNOSIS — I120 Hypertensive chronic kidney disease with stage 5 chronic kidney disease or end stage renal disease: Secondary | ICD-10-CM | POA: Diagnosis not present

## 2015-07-12 DIAGNOSIS — R600 Localized edema: Secondary | ICD-10-CM | POA: Diagnosis not present

## 2015-07-12 DIAGNOSIS — E119 Type 2 diabetes mellitus without complications: Secondary | ICD-10-CM | POA: Diagnosis not present

## 2015-07-12 DIAGNOSIS — Z79899 Other long term (current) drug therapy: Secondary | ICD-10-CM | POA: Insufficient documentation

## 2015-07-12 DIAGNOSIS — D899 Disorder involving the immune mechanism, unspecified: Secondary | ICD-10-CM | POA: Diagnosis not present

## 2015-07-12 DIAGNOSIS — M109 Gout, unspecified: Secondary | ICD-10-CM | POA: Diagnosis not present

## 2015-07-16 DIAGNOSIS — I120 Hypertensive chronic kidney disease with stage 5 chronic kidney disease or end stage renal disease: Secondary | ICD-10-CM | POA: Diagnosis not present

## 2015-07-16 DIAGNOSIS — E119 Type 2 diabetes mellitus without complications: Secondary | ICD-10-CM | POA: Diagnosis not present

## 2015-07-16 DIAGNOSIS — Z4822 Encounter for aftercare following kidney transplant: Secondary | ICD-10-CM | POA: Diagnosis not present

## 2015-07-16 DIAGNOSIS — Z955 Presence of coronary angioplasty implant and graft: Secondary | ICD-10-CM | POA: Diagnosis not present

## 2015-07-16 DIAGNOSIS — G629 Polyneuropathy, unspecified: Secondary | ICD-10-CM | POA: Diagnosis not present

## 2015-07-16 DIAGNOSIS — Z8673 Personal history of transient ischemic attack (TIA), and cerebral infarction without residual deficits: Secondary | ICD-10-CM | POA: Diagnosis not present

## 2015-07-16 DIAGNOSIS — Z466 Encounter for fitting and adjustment of urinary device: Secondary | ICD-10-CM | POA: Diagnosis not present

## 2015-07-16 DIAGNOSIS — Z7902 Long term (current) use of antithrombotics/antiplatelets: Secondary | ICD-10-CM | POA: Diagnosis not present

## 2015-07-16 DIAGNOSIS — Z792 Long term (current) use of antibiotics: Secondary | ICD-10-CM | POA: Diagnosis not present

## 2015-07-16 DIAGNOSIS — E1122 Type 2 diabetes mellitus with diabetic chronic kidney disease: Secondary | ICD-10-CM | POA: Diagnosis not present

## 2015-07-16 DIAGNOSIS — Z794 Long term (current) use of insulin: Secondary | ICD-10-CM | POA: Diagnosis not present

## 2015-07-16 DIAGNOSIS — Z940 Kidney transplant status: Secondary | ICD-10-CM | POA: Diagnosis not present

## 2015-07-16 DIAGNOSIS — I2510 Atherosclerotic heart disease of native coronary artery without angina pectoris: Secondary | ICD-10-CM | POA: Diagnosis not present

## 2015-07-16 DIAGNOSIS — N186 End stage renal disease: Secondary | ICD-10-CM | POA: Diagnosis not present

## 2015-07-16 DIAGNOSIS — Z7982 Long term (current) use of aspirin: Secondary | ICD-10-CM | POA: Diagnosis not present

## 2015-07-16 DIAGNOSIS — Z79899 Other long term (current) drug therapy: Secondary | ICD-10-CM | POA: Diagnosis not present

## 2015-07-16 DIAGNOSIS — I10 Essential (primary) hypertension: Secondary | ICD-10-CM | POA: Diagnosis not present

## 2015-07-18 DIAGNOSIS — E1142 Type 2 diabetes mellitus with diabetic polyneuropathy: Secondary | ICD-10-CM | POA: Diagnosis not present

## 2015-07-18 DIAGNOSIS — N186 End stage renal disease: Secondary | ICD-10-CM | POA: Diagnosis not present

## 2015-07-18 DIAGNOSIS — Z79899 Other long term (current) drug therapy: Secondary | ICD-10-CM | POA: Diagnosis not present

## 2015-07-18 DIAGNOSIS — D899 Disorder involving the immune mechanism, unspecified: Secondary | ICD-10-CM | POA: Diagnosis not present

## 2015-07-18 DIAGNOSIS — D72819 Decreased white blood cell count, unspecified: Secondary | ICD-10-CM | POA: Diagnosis not present

## 2015-07-18 DIAGNOSIS — Z940 Kidney transplant status: Secondary | ICD-10-CM | POA: Diagnosis not present

## 2015-07-18 DIAGNOSIS — Z8673 Personal history of transient ischemic attack (TIA), and cerebral infarction without residual deficits: Secondary | ICD-10-CM | POA: Diagnosis not present

## 2015-07-18 DIAGNOSIS — E1122 Type 2 diabetes mellitus with diabetic chronic kidney disease: Secondary | ICD-10-CM | POA: Diagnosis not present

## 2015-07-18 DIAGNOSIS — Z955 Presence of coronary angioplasty implant and graft: Secondary | ICD-10-CM | POA: Diagnosis not present

## 2015-07-18 DIAGNOSIS — Z794 Long term (current) use of insulin: Secondary | ICD-10-CM | POA: Diagnosis not present

## 2015-07-18 DIAGNOSIS — I10 Essential (primary) hypertension: Secondary | ICD-10-CM | POA: Diagnosis not present

## 2015-07-18 DIAGNOSIS — I120 Hypertensive chronic kidney disease with stage 5 chronic kidney disease or end stage renal disease: Secondary | ICD-10-CM | POA: Diagnosis not present

## 2015-07-18 DIAGNOSIS — Z7902 Long term (current) use of antithrombotics/antiplatelets: Secondary | ICD-10-CM | POA: Diagnosis not present

## 2015-07-18 DIAGNOSIS — Z7982 Long term (current) use of aspirin: Secondary | ICD-10-CM | POA: Diagnosis not present

## 2015-07-18 DIAGNOSIS — I2510 Atherosclerotic heart disease of native coronary artery without angina pectoris: Secondary | ICD-10-CM | POA: Diagnosis not present

## 2015-07-18 DIAGNOSIS — Z4822 Encounter for aftercare following kidney transplant: Secondary | ICD-10-CM | POA: Diagnosis not present

## 2015-07-22 DIAGNOSIS — Z4822 Encounter for aftercare following kidney transplant: Secondary | ICD-10-CM | POA: Diagnosis not present

## 2015-07-22 DIAGNOSIS — Z8673 Personal history of transient ischemic attack (TIA), and cerebral infarction without residual deficits: Secondary | ICD-10-CM | POA: Diagnosis not present

## 2015-07-22 DIAGNOSIS — Z7902 Long term (current) use of antithrombotics/antiplatelets: Secondary | ICD-10-CM | POA: Diagnosis not present

## 2015-07-22 DIAGNOSIS — M109 Gout, unspecified: Secondary | ICD-10-CM | POA: Diagnosis not present

## 2015-07-22 DIAGNOSIS — Z7982 Long term (current) use of aspirin: Secondary | ICD-10-CM | POA: Diagnosis not present

## 2015-07-22 DIAGNOSIS — E785 Hyperlipidemia, unspecified: Secondary | ICD-10-CM | POA: Diagnosis not present

## 2015-07-22 DIAGNOSIS — I2510 Atherosclerotic heart disease of native coronary artery without angina pectoris: Secondary | ICD-10-CM | POA: Diagnosis not present

## 2015-07-22 DIAGNOSIS — I120 Hypertensive chronic kidney disease with stage 5 chronic kidney disease or end stage renal disease: Secondary | ICD-10-CM | POA: Diagnosis not present

## 2015-07-22 DIAGNOSIS — D649 Anemia, unspecified: Secondary | ICD-10-CM | POA: Diagnosis not present

## 2015-07-22 DIAGNOSIS — Z79899 Other long term (current) drug therapy: Secondary | ICD-10-CM | POA: Diagnosis not present

## 2015-07-22 DIAGNOSIS — Z992 Dependence on renal dialysis: Secondary | ICD-10-CM | POA: Diagnosis not present

## 2015-07-22 DIAGNOSIS — E1122 Type 2 diabetes mellitus with diabetic chronic kidney disease: Secondary | ICD-10-CM | POA: Diagnosis not present

## 2015-07-22 DIAGNOSIS — Z794 Long term (current) use of insulin: Secondary | ICD-10-CM | POA: Diagnosis not present

## 2015-07-22 DIAGNOSIS — E1142 Type 2 diabetes mellitus with diabetic polyneuropathy: Secondary | ICD-10-CM | POA: Diagnosis not present

## 2015-07-22 DIAGNOSIS — N186 End stage renal disease: Secondary | ICD-10-CM | POA: Diagnosis not present

## 2015-07-22 DIAGNOSIS — Z940 Kidney transplant status: Secondary | ICD-10-CM | POA: Diagnosis not present

## 2015-07-22 DIAGNOSIS — D899 Disorder involving the immune mechanism, unspecified: Secondary | ICD-10-CM | POA: Diagnosis not present

## 2015-07-25 DIAGNOSIS — Z955 Presence of coronary angioplasty implant and graft: Secondary | ICD-10-CM | POA: Diagnosis not present

## 2015-07-25 DIAGNOSIS — Z7902 Long term (current) use of antithrombotics/antiplatelets: Secondary | ICD-10-CM | POA: Diagnosis not present

## 2015-07-25 DIAGNOSIS — E1122 Type 2 diabetes mellitus with diabetic chronic kidney disease: Secondary | ICD-10-CM | POA: Diagnosis not present

## 2015-07-25 DIAGNOSIS — Z794 Long term (current) use of insulin: Secondary | ICD-10-CM | POA: Diagnosis not present

## 2015-07-25 DIAGNOSIS — Z940 Kidney transplant status: Secondary | ICD-10-CM | POA: Diagnosis not present

## 2015-07-25 DIAGNOSIS — Z4822 Encounter for aftercare following kidney transplant: Secondary | ICD-10-CM | POA: Diagnosis not present

## 2015-07-25 DIAGNOSIS — Z7982 Long term (current) use of aspirin: Secondary | ICD-10-CM | POA: Diagnosis not present

## 2015-07-25 DIAGNOSIS — M109 Gout, unspecified: Secondary | ICD-10-CM | POA: Diagnosis not present

## 2015-07-25 DIAGNOSIS — N186 End stage renal disease: Secondary | ICD-10-CM | POA: Diagnosis not present

## 2015-07-25 DIAGNOSIS — E1142 Type 2 diabetes mellitus with diabetic polyneuropathy: Secondary | ICD-10-CM | POA: Diagnosis not present

## 2015-07-25 DIAGNOSIS — G4733 Obstructive sleep apnea (adult) (pediatric): Secondary | ICD-10-CM | POA: Diagnosis not present

## 2015-07-25 DIAGNOSIS — I2510 Atherosclerotic heart disease of native coronary artery without angina pectoris: Secondary | ICD-10-CM | POA: Diagnosis not present

## 2015-07-25 DIAGNOSIS — I120 Hypertensive chronic kidney disease with stage 5 chronic kidney disease or end stage renal disease: Secondary | ICD-10-CM | POA: Diagnosis not present

## 2015-07-25 DIAGNOSIS — E785 Hyperlipidemia, unspecified: Secondary | ICD-10-CM | POA: Diagnosis not present

## 2015-07-25 DIAGNOSIS — Z8673 Personal history of transient ischemic attack (TIA), and cerebral infarction without residual deficits: Secondary | ICD-10-CM | POA: Diagnosis not present

## 2015-07-25 DIAGNOSIS — D899 Disorder involving the immune mechanism, unspecified: Secondary | ICD-10-CM | POA: Diagnosis not present

## 2015-07-25 DIAGNOSIS — Z79899 Other long term (current) drug therapy: Secondary | ICD-10-CM | POA: Diagnosis not present

## 2015-08-01 DIAGNOSIS — E785 Hyperlipidemia, unspecified: Secondary | ICD-10-CM | POA: Diagnosis not present

## 2015-08-01 DIAGNOSIS — D899 Disorder involving the immune mechanism, unspecified: Secondary | ICD-10-CM | POA: Diagnosis not present

## 2015-08-01 DIAGNOSIS — N186 End stage renal disease: Secondary | ICD-10-CM | POA: Diagnosis not present

## 2015-08-01 DIAGNOSIS — I2510 Atherosclerotic heart disease of native coronary artery without angina pectoris: Secondary | ICD-10-CM | POA: Diagnosis not present

## 2015-08-01 DIAGNOSIS — E1142 Type 2 diabetes mellitus with diabetic polyneuropathy: Secondary | ICD-10-CM | POA: Diagnosis not present

## 2015-08-01 DIAGNOSIS — M109 Gout, unspecified: Secondary | ICD-10-CM | POA: Diagnosis not present

## 2015-08-01 DIAGNOSIS — E1122 Type 2 diabetes mellitus with diabetic chronic kidney disease: Secondary | ICD-10-CM | POA: Diagnosis not present

## 2015-08-01 DIAGNOSIS — Z794 Long term (current) use of insulin: Secondary | ICD-10-CM | POA: Diagnosis not present

## 2015-08-01 DIAGNOSIS — D72819 Decreased white blood cell count, unspecified: Secondary | ICD-10-CM | POA: Diagnosis not present

## 2015-08-01 DIAGNOSIS — Z940 Kidney transplant status: Secondary | ICD-10-CM | POA: Diagnosis not present

## 2015-08-01 DIAGNOSIS — I10 Essential (primary) hypertension: Secondary | ICD-10-CM | POA: Diagnosis not present

## 2015-08-01 DIAGNOSIS — Z4822 Encounter for aftercare following kidney transplant: Secondary | ICD-10-CM | POA: Diagnosis not present

## 2015-08-01 DIAGNOSIS — Z7982 Long term (current) use of aspirin: Secondary | ICD-10-CM | POA: Diagnosis not present

## 2015-08-01 DIAGNOSIS — B259 Cytomegaloviral disease, unspecified: Secondary | ICD-10-CM | POA: Diagnosis not present

## 2015-08-01 DIAGNOSIS — Z955 Presence of coronary angioplasty implant and graft: Secondary | ICD-10-CM | POA: Diagnosis not present

## 2015-08-01 DIAGNOSIS — D649 Anemia, unspecified: Secondary | ICD-10-CM | POA: Diagnosis not present

## 2015-08-01 DIAGNOSIS — Z79899 Other long term (current) drug therapy: Secondary | ICD-10-CM | POA: Diagnosis not present

## 2015-08-08 DIAGNOSIS — I2510 Atherosclerotic heart disease of native coronary artery without angina pectoris: Secondary | ICD-10-CM | POA: Diagnosis not present

## 2015-08-08 DIAGNOSIS — Z794 Long term (current) use of insulin: Secondary | ICD-10-CM | POA: Diagnosis not present

## 2015-08-08 DIAGNOSIS — Z992 Dependence on renal dialysis: Secondary | ICD-10-CM | POA: Diagnosis not present

## 2015-08-08 DIAGNOSIS — E119 Type 2 diabetes mellitus without complications: Secondary | ICD-10-CM | POA: Diagnosis not present

## 2015-08-08 DIAGNOSIS — M109 Gout, unspecified: Secondary | ICD-10-CM | POA: Diagnosis not present

## 2015-08-08 DIAGNOSIS — Z7982 Long term (current) use of aspirin: Secondary | ICD-10-CM | POA: Diagnosis not present

## 2015-08-08 DIAGNOSIS — E1140 Type 2 diabetes mellitus with diabetic neuropathy, unspecified: Secondary | ICD-10-CM | POA: Diagnosis not present

## 2015-08-08 DIAGNOSIS — D899 Disorder involving the immune mechanism, unspecified: Secondary | ICD-10-CM | POA: Diagnosis not present

## 2015-08-08 DIAGNOSIS — I120 Hypertensive chronic kidney disease with stage 5 chronic kidney disease or end stage renal disease: Secondary | ICD-10-CM | POA: Diagnosis not present

## 2015-08-08 DIAGNOSIS — N186 End stage renal disease: Secondary | ICD-10-CM | POA: Diagnosis not present

## 2015-08-08 DIAGNOSIS — E785 Hyperlipidemia, unspecified: Secondary | ICD-10-CM | POA: Diagnosis not present

## 2015-08-08 DIAGNOSIS — Z79899 Other long term (current) drug therapy: Secondary | ICD-10-CM | POA: Diagnosis not present

## 2015-08-08 DIAGNOSIS — Z4822 Encounter for aftercare following kidney transplant: Secondary | ICD-10-CM | POA: Diagnosis not present

## 2015-08-08 DIAGNOSIS — Z940 Kidney transplant status: Secondary | ICD-10-CM | POA: Diagnosis not present

## 2015-08-08 DIAGNOSIS — G629 Polyneuropathy, unspecified: Secondary | ICD-10-CM | POA: Diagnosis not present

## 2015-08-08 DIAGNOSIS — D8989 Other specified disorders involving the immune mechanism, not elsewhere classified: Secondary | ICD-10-CM | POA: Diagnosis not present

## 2015-08-08 DIAGNOSIS — B259 Cytomegaloviral disease, unspecified: Secondary | ICD-10-CM | POA: Diagnosis not present

## 2015-08-08 DIAGNOSIS — E1122 Type 2 diabetes mellitus with diabetic chronic kidney disease: Secondary | ICD-10-CM | POA: Diagnosis not present

## 2015-08-08 DIAGNOSIS — I10 Essential (primary) hypertension: Secondary | ICD-10-CM | POA: Diagnosis not present

## 2015-08-15 DIAGNOSIS — G629 Polyneuropathy, unspecified: Secondary | ICD-10-CM | POA: Diagnosis not present

## 2015-08-15 DIAGNOSIS — R251 Tremor, unspecified: Secondary | ICD-10-CM | POA: Diagnosis not present

## 2015-08-15 DIAGNOSIS — Z794 Long term (current) use of insulin: Secondary | ICD-10-CM | POA: Diagnosis not present

## 2015-08-15 DIAGNOSIS — D899 Disorder involving the immune mechanism, unspecified: Secondary | ICD-10-CM | POA: Diagnosis not present

## 2015-08-15 DIAGNOSIS — Z4822 Encounter for aftercare following kidney transplant: Secondary | ICD-10-CM | POA: Diagnosis not present

## 2015-08-15 DIAGNOSIS — M109 Gout, unspecified: Secondary | ICD-10-CM | POA: Diagnosis not present

## 2015-08-15 DIAGNOSIS — N186 End stage renal disease: Secondary | ICD-10-CM | POA: Diagnosis not present

## 2015-08-15 DIAGNOSIS — Z7982 Long term (current) use of aspirin: Secondary | ICD-10-CM | POA: Diagnosis not present

## 2015-08-15 DIAGNOSIS — I120 Hypertensive chronic kidney disease with stage 5 chronic kidney disease or end stage renal disease: Secondary | ICD-10-CM | POA: Diagnosis not present

## 2015-08-15 DIAGNOSIS — D72819 Decreased white blood cell count, unspecified: Secondary | ICD-10-CM | POA: Diagnosis not present

## 2015-08-15 DIAGNOSIS — E1122 Type 2 diabetes mellitus with diabetic chronic kidney disease: Secondary | ICD-10-CM | POA: Diagnosis not present

## 2015-08-15 DIAGNOSIS — I10 Essential (primary) hypertension: Secondary | ICD-10-CM | POA: Diagnosis not present

## 2015-08-15 DIAGNOSIS — D649 Anemia, unspecified: Secondary | ICD-10-CM | POA: Diagnosis not present

## 2015-08-15 DIAGNOSIS — E119 Type 2 diabetes mellitus without complications: Secondary | ICD-10-CM | POA: Diagnosis not present

## 2015-08-15 DIAGNOSIS — E785 Hyperlipidemia, unspecified: Secondary | ICD-10-CM | POA: Diagnosis not present

## 2015-08-15 DIAGNOSIS — B259 Cytomegaloviral disease, unspecified: Secondary | ICD-10-CM | POA: Diagnosis not present

## 2015-08-15 DIAGNOSIS — Z79899 Other long term (current) drug therapy: Secondary | ICD-10-CM | POA: Diagnosis not present

## 2015-08-15 DIAGNOSIS — Z940 Kidney transplant status: Secondary | ICD-10-CM | POA: Diagnosis not present

## 2015-08-15 DIAGNOSIS — I2510 Atherosclerotic heart disease of native coronary artery without angina pectoris: Secondary | ICD-10-CM | POA: Diagnosis not present

## 2015-08-29 DIAGNOSIS — Z955 Presence of coronary angioplasty implant and graft: Secondary | ICD-10-CM | POA: Diagnosis not present

## 2015-08-29 DIAGNOSIS — Z992 Dependence on renal dialysis: Secondary | ICD-10-CM | POA: Diagnosis not present

## 2015-08-29 DIAGNOSIS — Z4822 Encounter for aftercare following kidney transplant: Secondary | ICD-10-CM | POA: Diagnosis not present

## 2015-08-29 DIAGNOSIS — D72819 Decreased white blood cell count, unspecified: Secondary | ICD-10-CM | POA: Diagnosis not present

## 2015-08-29 DIAGNOSIS — Z79899 Other long term (current) drug therapy: Secondary | ICD-10-CM | POA: Diagnosis not present

## 2015-08-29 DIAGNOSIS — D899 Disorder involving the immune mechanism, unspecified: Secondary | ICD-10-CM | POA: Diagnosis not present

## 2015-08-29 DIAGNOSIS — Z940 Kidney transplant status: Secondary | ICD-10-CM | POA: Diagnosis not present

## 2015-08-29 DIAGNOSIS — I2510 Atherosclerotic heart disease of native coronary artery without angina pectoris: Secondary | ICD-10-CM | POA: Diagnosis not present

## 2015-08-29 DIAGNOSIS — E119 Type 2 diabetes mellitus without complications: Secondary | ICD-10-CM | POA: Diagnosis not present

## 2015-08-29 DIAGNOSIS — D649 Anemia, unspecified: Secondary | ICD-10-CM | POA: Diagnosis not present

## 2015-08-29 DIAGNOSIS — G629 Polyneuropathy, unspecified: Secondary | ICD-10-CM | POA: Diagnosis not present

## 2015-08-29 DIAGNOSIS — G4733 Obstructive sleep apnea (adult) (pediatric): Secondary | ICD-10-CM | POA: Diagnosis not present

## 2015-08-29 DIAGNOSIS — Z7982 Long term (current) use of aspirin: Secondary | ICD-10-CM | POA: Diagnosis not present

## 2015-08-29 DIAGNOSIS — Z794 Long term (current) use of insulin: Secondary | ICD-10-CM | POA: Diagnosis not present

## 2015-08-29 DIAGNOSIS — M109 Gout, unspecified: Secondary | ICD-10-CM | POA: Diagnosis not present

## 2015-08-29 DIAGNOSIS — E8770 Fluid overload, unspecified: Secondary | ICD-10-CM | POA: Diagnosis not present

## 2015-08-29 DIAGNOSIS — N186 End stage renal disease: Secondary | ICD-10-CM | POA: Diagnosis not present

## 2015-08-29 DIAGNOSIS — E785 Hyperlipidemia, unspecified: Secondary | ICD-10-CM | POA: Diagnosis not present

## 2015-08-29 DIAGNOSIS — I10 Essential (primary) hypertension: Secondary | ICD-10-CM | POA: Diagnosis not present

## 2015-08-29 DIAGNOSIS — E1140 Type 2 diabetes mellitus with diabetic neuropathy, unspecified: Secondary | ICD-10-CM | POA: Diagnosis not present

## 2015-08-29 DIAGNOSIS — I120 Hypertensive chronic kidney disease with stage 5 chronic kidney disease or end stage renal disease: Secondary | ICD-10-CM | POA: Diagnosis not present

## 2015-08-29 DIAGNOSIS — E1122 Type 2 diabetes mellitus with diabetic chronic kidney disease: Secondary | ICD-10-CM | POA: Diagnosis not present

## 2015-09-12 DIAGNOSIS — Z992 Dependence on renal dialysis: Secondary | ICD-10-CM | POA: Diagnosis not present

## 2015-09-12 DIAGNOSIS — Z79899 Other long term (current) drug therapy: Secondary | ICD-10-CM | POA: Diagnosis not present

## 2015-09-12 DIAGNOSIS — E1140 Type 2 diabetes mellitus with diabetic neuropathy, unspecified: Secondary | ICD-10-CM | POA: Diagnosis not present

## 2015-09-12 DIAGNOSIS — B259 Cytomegaloviral disease, unspecified: Secondary | ICD-10-CM | POA: Diagnosis not present

## 2015-09-12 DIAGNOSIS — M109 Gout, unspecified: Secondary | ICD-10-CM | POA: Diagnosis not present

## 2015-09-12 DIAGNOSIS — E785 Hyperlipidemia, unspecified: Secondary | ICD-10-CM | POA: Diagnosis not present

## 2015-09-12 DIAGNOSIS — I120 Hypertensive chronic kidney disease with stage 5 chronic kidney disease or end stage renal disease: Secondary | ICD-10-CM | POA: Diagnosis not present

## 2015-09-12 DIAGNOSIS — N186 End stage renal disease: Secondary | ICD-10-CM | POA: Diagnosis not present

## 2015-09-12 DIAGNOSIS — D8989 Other specified disorders involving the immune mechanism, not elsewhere classified: Secondary | ICD-10-CM | POA: Diagnosis not present

## 2015-09-12 DIAGNOSIS — D899 Disorder involving the immune mechanism, unspecified: Secondary | ICD-10-CM | POA: Diagnosis not present

## 2015-09-12 DIAGNOSIS — Z794 Long term (current) use of insulin: Secondary | ICD-10-CM | POA: Diagnosis not present

## 2015-09-12 DIAGNOSIS — D72819 Decreased white blood cell count, unspecified: Secondary | ICD-10-CM | POA: Diagnosis not present

## 2015-09-12 DIAGNOSIS — I2510 Atherosclerotic heart disease of native coronary artery without angina pectoris: Secondary | ICD-10-CM | POA: Diagnosis not present

## 2015-09-12 DIAGNOSIS — D649 Anemia, unspecified: Secondary | ICD-10-CM | POA: Diagnosis not present

## 2015-09-12 DIAGNOSIS — Z7982 Long term (current) use of aspirin: Secondary | ICD-10-CM | POA: Diagnosis not present

## 2015-09-12 DIAGNOSIS — Z940 Kidney transplant status: Secondary | ICD-10-CM | POA: Diagnosis not present

## 2015-09-12 DIAGNOSIS — Z4822 Encounter for aftercare following kidney transplant: Secondary | ICD-10-CM | POA: Diagnosis not present

## 2015-09-12 DIAGNOSIS — E1122 Type 2 diabetes mellitus with diabetic chronic kidney disease: Secondary | ICD-10-CM | POA: Diagnosis not present

## 2015-09-16 DIAGNOSIS — Z940 Kidney transplant status: Secondary | ICD-10-CM | POA: Diagnosis not present

## 2015-09-26 DIAGNOSIS — Z940 Kidney transplant status: Secondary | ICD-10-CM | POA: Diagnosis not present

## 2015-10-14 DIAGNOSIS — N3281 Overactive bladder: Secondary | ICD-10-CM | POA: Diagnosis not present

## 2015-10-14 DIAGNOSIS — N138 Other obstructive and reflux uropathy: Secondary | ICD-10-CM | POA: Diagnosis not present

## 2015-10-14 DIAGNOSIS — N401 Enlarged prostate with lower urinary tract symptoms: Secondary | ICD-10-CM | POA: Diagnosis not present

## 2015-10-14 DIAGNOSIS — Z0000 Encounter for general adult medical examination without abnormal findings: Secondary | ICD-10-CM | POA: Diagnosis not present

## 2015-10-14 DIAGNOSIS — R972 Elevated prostate specific antigen [PSA]: Secondary | ICD-10-CM | POA: Diagnosis not present

## 2015-10-18 DIAGNOSIS — E1142 Type 2 diabetes mellitus with diabetic polyneuropathy: Secondary | ICD-10-CM | POA: Diagnosis not present

## 2015-10-18 DIAGNOSIS — Z79899 Other long term (current) drug therapy: Secondary | ICD-10-CM | POA: Diagnosis not present

## 2015-10-18 DIAGNOSIS — Z7982 Long term (current) use of aspirin: Secondary | ICD-10-CM | POA: Diagnosis not present

## 2015-10-18 DIAGNOSIS — Z8673 Personal history of transient ischemic attack (TIA), and cerebral infarction without residual deficits: Secondary | ICD-10-CM | POA: Diagnosis not present

## 2015-10-18 DIAGNOSIS — D899 Disorder involving the immune mechanism, unspecified: Secondary | ICD-10-CM | POA: Diagnosis not present

## 2015-10-18 DIAGNOSIS — E1122 Type 2 diabetes mellitus with diabetic chronic kidney disease: Secondary | ICD-10-CM | POA: Diagnosis not present

## 2015-10-18 DIAGNOSIS — M109 Gout, unspecified: Secondary | ICD-10-CM | POA: Diagnosis not present

## 2015-10-18 DIAGNOSIS — Z992 Dependence on renal dialysis: Secondary | ICD-10-CM | POA: Diagnosis not present

## 2015-10-18 DIAGNOSIS — Z955 Presence of coronary angioplasty implant and graft: Secondary | ICD-10-CM | POA: Diagnosis not present

## 2015-10-18 DIAGNOSIS — I129 Hypertensive chronic kidney disease with stage 1 through stage 4 chronic kidney disease, or unspecified chronic kidney disease: Secondary | ICD-10-CM | POA: Diagnosis not present

## 2015-10-18 DIAGNOSIS — I120 Hypertensive chronic kidney disease with stage 5 chronic kidney disease or end stage renal disease: Secondary | ICD-10-CM | POA: Diagnosis not present

## 2015-10-18 DIAGNOSIS — D649 Anemia, unspecified: Secondary | ICD-10-CM | POA: Diagnosis not present

## 2015-10-18 DIAGNOSIS — Z4822 Encounter for aftercare following kidney transplant: Secondary | ICD-10-CM | POA: Diagnosis not present

## 2015-10-18 DIAGNOSIS — N186 End stage renal disease: Secondary | ICD-10-CM | POA: Diagnosis not present

## 2015-10-18 DIAGNOSIS — Z940 Kidney transplant status: Secondary | ICD-10-CM | POA: Diagnosis not present

## 2015-10-18 DIAGNOSIS — Z7902 Long term (current) use of antithrombotics/antiplatelets: Secondary | ICD-10-CM | POA: Diagnosis not present

## 2015-10-18 DIAGNOSIS — Z794 Long term (current) use of insulin: Secondary | ICD-10-CM | POA: Diagnosis not present

## 2015-10-18 DIAGNOSIS — D8989 Other specified disorders involving the immune mechanism, not elsewhere classified: Secondary | ICD-10-CM | POA: Diagnosis not present

## 2015-10-18 DIAGNOSIS — I2510 Atherosclerotic heart disease of native coronary artery without angina pectoris: Secondary | ICD-10-CM | POA: Diagnosis not present

## 2015-10-18 DIAGNOSIS — E785 Hyperlipidemia, unspecified: Secondary | ICD-10-CM | POA: Diagnosis not present

## 2015-11-01 DIAGNOSIS — E119 Type 2 diabetes mellitus without complications: Secondary | ICD-10-CM | POA: Diagnosis not present

## 2015-11-01 DIAGNOSIS — Z940 Kidney transplant status: Secondary | ICD-10-CM | POA: Diagnosis not present

## 2015-11-22 DIAGNOSIS — Z940 Kidney transplant status: Secondary | ICD-10-CM | POA: Diagnosis not present

## 2015-11-22 DIAGNOSIS — Z7901 Long term (current) use of anticoagulants: Secondary | ICD-10-CM | POA: Diagnosis not present

## 2015-11-22 DIAGNOSIS — Z951 Presence of aortocoronary bypass graft: Secondary | ICD-10-CM | POA: Diagnosis not present

## 2015-11-22 DIAGNOSIS — E1122 Type 2 diabetes mellitus with diabetic chronic kidney disease: Secondary | ICD-10-CM | POA: Diagnosis not present

## 2015-11-22 DIAGNOSIS — E119 Type 2 diabetes mellitus without complications: Secondary | ICD-10-CM | POA: Diagnosis not present

## 2015-11-22 DIAGNOSIS — E785 Hyperlipidemia, unspecified: Secondary | ICD-10-CM | POA: Diagnosis not present

## 2015-11-22 DIAGNOSIS — Z7982 Long term (current) use of aspirin: Secondary | ICD-10-CM | POA: Diagnosis not present

## 2015-11-22 DIAGNOSIS — G629 Polyneuropathy, unspecified: Secondary | ICD-10-CM | POA: Diagnosis not present

## 2015-11-22 DIAGNOSIS — N186 End stage renal disease: Secondary | ICD-10-CM | POA: Diagnosis not present

## 2015-11-22 DIAGNOSIS — Z8673 Personal history of transient ischemic attack (TIA), and cerebral infarction without residual deficits: Secondary | ICD-10-CM | POA: Diagnosis not present

## 2015-11-22 DIAGNOSIS — I2510 Atherosclerotic heart disease of native coronary artery without angina pectoris: Secondary | ICD-10-CM | POA: Diagnosis not present

## 2015-11-22 DIAGNOSIS — I10 Essential (primary) hypertension: Secondary | ICD-10-CM | POA: Diagnosis not present

## 2015-11-22 DIAGNOSIS — M109 Gout, unspecified: Secondary | ICD-10-CM | POA: Diagnosis not present

## 2015-11-22 DIAGNOSIS — Z79899 Other long term (current) drug therapy: Secondary | ICD-10-CM | POA: Diagnosis not present

## 2015-11-22 DIAGNOSIS — D649 Anemia, unspecified: Secondary | ICD-10-CM | POA: Diagnosis not present

## 2015-11-22 DIAGNOSIS — D899 Disorder involving the immune mechanism, unspecified: Secondary | ICD-10-CM | POA: Diagnosis not present

## 2015-11-22 DIAGNOSIS — I120 Hypertensive chronic kidney disease with stage 5 chronic kidney disease or end stage renal disease: Secondary | ICD-10-CM | POA: Diagnosis not present

## 2015-11-22 DIAGNOSIS — Z794 Long term (current) use of insulin: Secondary | ICD-10-CM | POA: Diagnosis not present

## 2015-11-22 DIAGNOSIS — Z4822 Encounter for aftercare following kidney transplant: Secondary | ICD-10-CM | POA: Diagnosis not present

## 2015-12-06 DIAGNOSIS — Z940 Kidney transplant status: Secondary | ICD-10-CM | POA: Diagnosis not present

## 2015-12-11 DIAGNOSIS — M545 Low back pain: Secondary | ICD-10-CM | POA: Diagnosis not present

## 2015-12-11 DIAGNOSIS — E119 Type 2 diabetes mellitus without complications: Secondary | ICD-10-CM | POA: Diagnosis not present

## 2015-12-11 DIAGNOSIS — W19XXXA Unspecified fall, initial encounter: Secondary | ICD-10-CM | POA: Diagnosis not present

## 2015-12-11 DIAGNOSIS — Z794 Long term (current) use of insulin: Secondary | ICD-10-CM | POA: Diagnosis not present

## 2015-12-11 DIAGNOSIS — I120 Hypertensive chronic kidney disease with stage 5 chronic kidney disease or end stage renal disease: Secondary | ICD-10-CM | POA: Diagnosis not present

## 2015-12-11 DIAGNOSIS — R55 Syncope and collapse: Secondary | ICD-10-CM | POA: Diagnosis not present

## 2015-12-11 DIAGNOSIS — M4806 Spinal stenosis, lumbar region: Secondary | ICD-10-CM | POA: Diagnosis not present

## 2015-12-11 DIAGNOSIS — Z7982 Long term (current) use of aspirin: Secondary | ICD-10-CM | POA: Diagnosis not present

## 2015-12-11 DIAGNOSIS — G4733 Obstructive sleep apnea (adult) (pediatric): Secondary | ICD-10-CM | POA: Diagnosis not present

## 2015-12-11 DIAGNOSIS — M79605 Pain in left leg: Secondary | ICD-10-CM | POA: Diagnosis not present

## 2015-12-11 DIAGNOSIS — I2510 Atherosclerotic heart disease of native coronary artery without angina pectoris: Secondary | ICD-10-CM | POA: Diagnosis not present

## 2015-12-11 DIAGNOSIS — M47896 Other spondylosis, lumbar region: Secondary | ICD-10-CM | POA: Diagnosis not present

## 2015-12-11 DIAGNOSIS — G8929 Other chronic pain: Secondary | ICD-10-CM | POA: Diagnosis not present

## 2015-12-11 DIAGNOSIS — Z992 Dependence on renal dialysis: Secondary | ICD-10-CM | POA: Diagnosis not present

## 2015-12-11 DIAGNOSIS — M542 Cervicalgia: Secondary | ICD-10-CM | POA: Diagnosis not present

## 2015-12-11 DIAGNOSIS — M5442 Lumbago with sciatica, left side: Secondary | ICD-10-CM | POA: Diagnosis not present

## 2015-12-11 DIAGNOSIS — E1122 Type 2 diabetes mellitus with diabetic chronic kidney disease: Secondary | ICD-10-CM | POA: Diagnosis not present

## 2015-12-11 DIAGNOSIS — N186 End stage renal disease: Secondary | ICD-10-CM | POA: Diagnosis not present

## 2015-12-11 DIAGNOSIS — E1142 Type 2 diabetes mellitus with diabetic polyneuropathy: Secondary | ICD-10-CM | POA: Diagnosis not present

## 2015-12-11 DIAGNOSIS — E785 Hyperlipidemia, unspecified: Secondary | ICD-10-CM | POA: Diagnosis not present

## 2015-12-12 DIAGNOSIS — M545 Low back pain: Secondary | ICD-10-CM | POA: Diagnosis not present

## 2015-12-12 DIAGNOSIS — I10 Essential (primary) hypertension: Secondary | ICD-10-CM | POA: Diagnosis not present

## 2015-12-12 DIAGNOSIS — E1122 Type 2 diabetes mellitus with diabetic chronic kidney disease: Secondary | ICD-10-CM | POA: Diagnosis not present

## 2015-12-12 DIAGNOSIS — D899 Disorder involving the immune mechanism, unspecified: Secondary | ICD-10-CM | POA: Diagnosis not present

## 2015-12-12 DIAGNOSIS — G4733 Obstructive sleep apnea (adult) (pediatric): Secondary | ICD-10-CM | POA: Diagnosis not present

## 2015-12-12 DIAGNOSIS — W19XXXA Unspecified fall, initial encounter: Secondary | ICD-10-CM | POA: Diagnosis not present

## 2015-12-12 DIAGNOSIS — M79605 Pain in left leg: Secondary | ICD-10-CM | POA: Diagnosis not present

## 2015-12-12 DIAGNOSIS — R55 Syncope and collapse: Secondary | ICD-10-CM | POA: Diagnosis not present

## 2015-12-12 DIAGNOSIS — I454 Nonspecific intraventricular block: Secondary | ICD-10-CM | POA: Diagnosis not present

## 2015-12-16 ENCOUNTER — Telehealth: Payer: Self-pay | Admitting: Family Medicine

## 2015-12-16 NOTE — Telephone Encounter (Signed)
Pt state that he was in [facility] 7/19-20 and when discharged they wanted him to get a ambulatory epidural steroid injection for his back (has 2 bulging disk) that need to be done by 7/27 and he would like to be referred out to [place].  Made pt aware Dr. Yong Channel has gone for the day.

## 2015-12-17 ENCOUNTER — Other Ambulatory Visit: Payer: Self-pay

## 2015-12-17 DIAGNOSIS — M5126 Other intervertebral disc displacement, lumbar region: Secondary | ICD-10-CM

## 2015-12-17 DIAGNOSIS — M5136 Other intervertebral disc degeneration, lumbar region: Secondary | ICD-10-CM

## 2015-12-17 NOTE — Telephone Encounter (Signed)
Referral made as requested. Called patient to make aware. Left message for return phone call.

## 2015-12-18 ENCOUNTER — Other Ambulatory Visit: Payer: Self-pay | Admitting: Family Medicine

## 2015-12-18 DIAGNOSIS — M5136 Other intervertebral disc degeneration, lumbar region: Secondary | ICD-10-CM

## 2015-12-18 DIAGNOSIS — M5126 Other intervertebral disc displacement, lumbar region: Secondary | ICD-10-CM

## 2015-12-20 DIAGNOSIS — I2510 Atherosclerotic heart disease of native coronary artery without angina pectoris: Secondary | ICD-10-CM | POA: Diagnosis not present

## 2015-12-20 DIAGNOSIS — Z4822 Encounter for aftercare following kidney transplant: Secondary | ICD-10-CM | POA: Diagnosis not present

## 2015-12-20 DIAGNOSIS — M549 Dorsalgia, unspecified: Secondary | ICD-10-CM | POA: Diagnosis not present

## 2015-12-20 DIAGNOSIS — B259 Cytomegaloviral disease, unspecified: Secondary | ICD-10-CM | POA: Diagnosis not present

## 2015-12-20 DIAGNOSIS — M109 Gout, unspecified: Secondary | ICD-10-CM | POA: Diagnosis not present

## 2015-12-20 DIAGNOSIS — Z7982 Long term (current) use of aspirin: Secondary | ICD-10-CM | POA: Diagnosis not present

## 2015-12-20 DIAGNOSIS — Z940 Kidney transplant status: Secondary | ICD-10-CM | POA: Diagnosis not present

## 2015-12-20 DIAGNOSIS — E1122 Type 2 diabetes mellitus with diabetic chronic kidney disease: Secondary | ICD-10-CM | POA: Diagnosis not present

## 2015-12-20 DIAGNOSIS — D631 Anemia in chronic kidney disease: Secondary | ICD-10-CM | POA: Diagnosis not present

## 2015-12-20 DIAGNOSIS — D8989 Other specified disorders involving the immune mechanism, not elsewhere classified: Secondary | ICD-10-CM | POA: Diagnosis not present

## 2015-12-20 DIAGNOSIS — Z955 Presence of coronary angioplasty implant and graft: Secondary | ICD-10-CM | POA: Diagnosis not present

## 2015-12-20 DIAGNOSIS — Z8673 Personal history of transient ischemic attack (TIA), and cerebral infarction without residual deficits: Secondary | ICD-10-CM | POA: Diagnosis not present

## 2015-12-20 DIAGNOSIS — E785 Hyperlipidemia, unspecified: Secondary | ICD-10-CM | POA: Diagnosis not present

## 2015-12-20 DIAGNOSIS — D899 Disorder involving the immune mechanism, unspecified: Secondary | ICD-10-CM | POA: Diagnosis not present

## 2015-12-20 DIAGNOSIS — I120 Hypertensive chronic kidney disease with stage 5 chronic kidney disease or end stage renal disease: Secondary | ICD-10-CM | POA: Diagnosis not present

## 2015-12-20 DIAGNOSIS — Z794 Long term (current) use of insulin: Secondary | ICD-10-CM | POA: Diagnosis not present

## 2015-12-20 DIAGNOSIS — Z79899 Other long term (current) drug therapy: Secondary | ICD-10-CM | POA: Diagnosis not present

## 2015-12-20 DIAGNOSIS — E1142 Type 2 diabetes mellitus with diabetic polyneuropathy: Secondary | ICD-10-CM | POA: Diagnosis not present

## 2015-12-20 DIAGNOSIS — N186 End stage renal disease: Secondary | ICD-10-CM | POA: Diagnosis not present

## 2015-12-20 DIAGNOSIS — E119 Type 2 diabetes mellitus without complications: Secondary | ICD-10-CM | POA: Diagnosis not present

## 2015-12-23 ENCOUNTER — Ambulatory Visit
Admission: RE | Admit: 2015-12-23 | Discharge: 2015-12-23 | Disposition: A | Discharge status: Home / Self Care | Payer: Medicare Other | Source: Ambulatory Visit | Attending: Family Medicine | Admitting: Family Medicine

## 2015-12-23 VITALS — BP 145/72 | HR 68

## 2015-12-23 DIAGNOSIS — M5136 Other intervertebral disc degeneration, lumbar region: Secondary | ICD-10-CM

## 2015-12-23 DIAGNOSIS — M545 Low back pain: Secondary | ICD-10-CM | POA: Diagnosis not present

## 2015-12-23 DIAGNOSIS — M5442 Lumbago with sciatica, left side: Secondary | ICD-10-CM

## 2015-12-23 DIAGNOSIS — M5126 Other intervertebral disc displacement, lumbar region: Secondary | ICD-10-CM

## 2015-12-23 MED ORDER — IOPAMIDOL (ISOVUE-M 200) INJECTION 41%
1.0000 mL | Freq: Once | INTRAMUSCULAR | Status: AC
  Administered 2015-12-23: 1 mL via EPIDURAL

## 2015-12-23 MED ORDER — METHYLPREDNISOLONE ACETATE 40 MG/ML INJ SUSP (RADIOLOG
120.0000 mg | Freq: Once | INTRAMUSCULAR | Status: AC
  Administered 2015-12-23: 120 mg via EPIDURAL

## 2015-12-23 NOTE — Discharge Instructions (Signed)

## 2015-12-26 DIAGNOSIS — N186 End stage renal disease: Secondary | ICD-10-CM | POA: Diagnosis not present

## 2015-12-26 DIAGNOSIS — Z992 Dependence on renal dialysis: Secondary | ICD-10-CM | POA: Diagnosis not present

## 2015-12-26 DIAGNOSIS — E785 Hyperlipidemia, unspecified: Secondary | ICD-10-CM | POA: Diagnosis not present

## 2015-12-26 DIAGNOSIS — E1165 Type 2 diabetes mellitus with hyperglycemia: Secondary | ICD-10-CM | POA: Diagnosis not present

## 2015-12-26 DIAGNOSIS — I120 Hypertensive chronic kidney disease with stage 5 chronic kidney disease or end stage renal disease: Secondary | ICD-10-CM | POA: Diagnosis not present

## 2015-12-26 DIAGNOSIS — Z7982 Long term (current) use of aspirin: Secondary | ICD-10-CM | POA: Diagnosis not present

## 2015-12-26 DIAGNOSIS — Z79899 Other long term (current) drug therapy: Secondary | ICD-10-CM | POA: Diagnosis not present

## 2015-12-26 DIAGNOSIS — Z794 Long term (current) use of insulin: Secondary | ICD-10-CM | POA: Diagnosis not present

## 2015-12-26 DIAGNOSIS — E1142 Type 2 diabetes mellitus with diabetic polyneuropathy: Secondary | ICD-10-CM | POA: Diagnosis not present

## 2015-12-26 DIAGNOSIS — E1122 Type 2 diabetes mellitus with diabetic chronic kidney disease: Secondary | ICD-10-CM | POA: Diagnosis not present

## 2015-12-26 DIAGNOSIS — I2510 Atherosclerotic heart disease of native coronary artery without angina pectoris: Secondary | ICD-10-CM | POA: Diagnosis not present

## 2015-12-26 DIAGNOSIS — Z8673 Personal history of transient ischemic attack (TIA), and cerebral infarction without residual deficits: Secondary | ICD-10-CM | POA: Diagnosis not present

## 2015-12-26 DIAGNOSIS — Z940 Kidney transplant status: Secondary | ICD-10-CM | POA: Diagnosis not present

## 2016-01-03 DIAGNOSIS — Z7982 Long term (current) use of aspirin: Secondary | ICD-10-CM | POA: Diagnosis not present

## 2016-01-03 DIAGNOSIS — Z794 Long term (current) use of insulin: Secondary | ICD-10-CM | POA: Diagnosis not present

## 2016-01-03 DIAGNOSIS — E119 Type 2 diabetes mellitus without complications: Secondary | ICD-10-CM | POA: Diagnosis not present

## 2016-01-03 DIAGNOSIS — Z4822 Encounter for aftercare following kidney transplant: Secondary | ICD-10-CM | POA: Diagnosis not present

## 2016-01-09 DIAGNOSIS — N186 End stage renal disease: Secondary | ICD-10-CM | POA: Diagnosis not present

## 2016-01-09 DIAGNOSIS — Z79899 Other long term (current) drug therapy: Secondary | ICD-10-CM | POA: Diagnosis not present

## 2016-01-09 DIAGNOSIS — M25552 Pain in left hip: Secondary | ICD-10-CM | POA: Diagnosis not present

## 2016-01-09 DIAGNOSIS — R112 Nausea with vomiting, unspecified: Secondary | ICD-10-CM | POA: Diagnosis not present

## 2016-01-09 DIAGNOSIS — I951 Orthostatic hypotension: Secondary | ICD-10-CM | POA: Diagnosis not present

## 2016-01-09 DIAGNOSIS — R9431 Abnormal electrocardiogram [ECG] [EKG]: Secondary | ICD-10-CM | POA: Diagnosis not present

## 2016-01-09 DIAGNOSIS — Z794 Long term (current) use of insulin: Secondary | ICD-10-CM | POA: Diagnosis not present

## 2016-01-09 DIAGNOSIS — D72819 Decreased white blood cell count, unspecified: Secondary | ICD-10-CM | POA: Diagnosis not present

## 2016-01-09 DIAGNOSIS — E860 Dehydration: Secondary | ICD-10-CM | POA: Diagnosis not present

## 2016-01-09 DIAGNOSIS — R509 Fever, unspecified: Secondary | ICD-10-CM | POA: Diagnosis not present

## 2016-01-09 DIAGNOSIS — I120 Hypertensive chronic kidney disease with stage 5 chronic kidney disease or end stage renal disease: Secondary | ICD-10-CM | POA: Diagnosis not present

## 2016-01-09 DIAGNOSIS — Z992 Dependence on renal dialysis: Secondary | ICD-10-CM | POA: Diagnosis not present

## 2016-01-09 DIAGNOSIS — D899 Disorder involving the immune mechanism, unspecified: Secondary | ICD-10-CM | POA: Diagnosis not present

## 2016-01-09 DIAGNOSIS — R55 Syncope and collapse: Secondary | ICD-10-CM | POA: Diagnosis not present

## 2016-01-09 DIAGNOSIS — M109 Gout, unspecified: Secondary | ICD-10-CM | POA: Diagnosis not present

## 2016-01-09 DIAGNOSIS — I517 Cardiomegaly: Secondary | ICD-10-CM | POA: Diagnosis not present

## 2016-01-09 DIAGNOSIS — E785 Hyperlipidemia, unspecified: Secondary | ICD-10-CM | POA: Diagnosis not present

## 2016-01-09 DIAGNOSIS — D649 Anemia, unspecified: Secondary | ICD-10-CM | POA: Diagnosis not present

## 2016-01-09 DIAGNOSIS — Z940 Kidney transplant status: Secondary | ICD-10-CM | POA: Diagnosis not present

## 2016-01-09 DIAGNOSIS — I10 Essential (primary) hypertension: Secondary | ICD-10-CM | POA: Diagnosis not present

## 2016-01-09 DIAGNOSIS — Z4822 Encounter for aftercare following kidney transplant: Secondary | ICD-10-CM | POA: Diagnosis not present

## 2016-01-09 DIAGNOSIS — I2510 Atherosclerotic heart disease of native coronary artery without angina pectoris: Secondary | ICD-10-CM | POA: Diagnosis not present

## 2016-01-09 DIAGNOSIS — R531 Weakness: Secondary | ICD-10-CM | POA: Diagnosis not present

## 2016-01-09 DIAGNOSIS — G6289 Other specified polyneuropathies: Secondary | ICD-10-CM | POA: Diagnosis not present

## 2016-01-09 DIAGNOSIS — E119 Type 2 diabetes mellitus without complications: Secondary | ICD-10-CM | POA: Diagnosis not present

## 2016-01-09 DIAGNOSIS — W19XXXA Unspecified fall, initial encounter: Secondary | ICD-10-CM | POA: Diagnosis not present

## 2016-01-09 DIAGNOSIS — M25512 Pain in left shoulder: Secondary | ICD-10-CM | POA: Diagnosis not present

## 2016-01-09 DIAGNOSIS — Z7982 Long term (current) use of aspirin: Secondary | ICD-10-CM | POA: Diagnosis not present

## 2016-01-09 DIAGNOSIS — B259 Cytomegaloviral disease, unspecified: Secondary | ICD-10-CM | POA: Diagnosis not present

## 2016-01-09 DIAGNOSIS — M549 Dorsalgia, unspecified: Secondary | ICD-10-CM | POA: Diagnosis not present

## 2016-01-10 DIAGNOSIS — Z944 Liver transplant status: Secondary | ICD-10-CM | POA: Diagnosis not present

## 2016-01-10 DIAGNOSIS — Z5181 Encounter for therapeutic drug level monitoring: Secondary | ICD-10-CM | POA: Diagnosis not present

## 2016-01-10 DIAGNOSIS — Z79899 Other long term (current) drug therapy: Secondary | ICD-10-CM | POA: Diagnosis not present

## 2016-01-10 DIAGNOSIS — R55 Syncope and collapse: Secondary | ICD-10-CM | POA: Diagnosis not present

## 2016-01-11 DIAGNOSIS — R509 Fever, unspecified: Secondary | ICD-10-CM | POA: Insufficient documentation

## 2016-01-11 DIAGNOSIS — R296 Repeated falls: Secondary | ICD-10-CM | POA: Diagnosis not present

## 2016-01-11 DIAGNOSIS — Z944 Liver transplant status: Secondary | ICD-10-CM | POA: Diagnosis not present

## 2016-01-11 DIAGNOSIS — W19XXXA Unspecified fall, initial encounter: Secondary | ICD-10-CM | POA: Insufficient documentation

## 2016-01-13 ENCOUNTER — Ambulatory Visit: Payer: Medicare Other | Admitting: Family Medicine

## 2016-01-14 DIAGNOSIS — I25119 Atherosclerotic heart disease of native coronary artery with unspecified angina pectoris: Secondary | ICD-10-CM | POA: Diagnosis not present

## 2016-01-14 DIAGNOSIS — E1342 Other specified diabetes mellitus with diabetic polyneuropathy: Secondary | ICD-10-CM | POA: Diagnosis not present

## 2016-01-14 DIAGNOSIS — Z8673 Personal history of transient ischemic attack (TIA), and cerebral infarction without residual deficits: Secondary | ICD-10-CM | POA: Diagnosis not present

## 2016-01-14 DIAGNOSIS — D899 Disorder involving the immune mechanism, unspecified: Secondary | ICD-10-CM | POA: Diagnosis not present

## 2016-01-14 DIAGNOSIS — Z4822 Encounter for aftercare following kidney transplant: Secondary | ICD-10-CM | POA: Diagnosis not present

## 2016-01-14 DIAGNOSIS — R55 Syncope and collapse: Secondary | ICD-10-CM | POA: Diagnosis not present

## 2016-01-16 ENCOUNTER — Ambulatory Visit (INDEPENDENT_AMBULATORY_CARE_PROVIDER_SITE_OTHER): Payer: Medicare Other | Admitting: Family Medicine

## 2016-01-16 ENCOUNTER — Encounter: Payer: Self-pay | Admitting: Family Medicine

## 2016-01-16 DIAGNOSIS — I2510 Atherosclerotic heart disease of native coronary artery without angina pectoris: Secondary | ICD-10-CM

## 2016-01-16 DIAGNOSIS — I10 Essential (primary) hypertension: Secondary | ICD-10-CM

## 2016-01-16 DIAGNOSIS — E0843 Diabetes mellitus due to underlying condition with diabetic autonomic (poly)neuropathy: Secondary | ICD-10-CM

## 2016-01-16 DIAGNOSIS — E785 Hyperlipidemia, unspecified: Secondary | ICD-10-CM | POA: Insufficient documentation

## 2016-01-16 DIAGNOSIS — E1142 Type 2 diabetes mellitus with diabetic polyneuropathy: Secondary | ICD-10-CM | POA: Diagnosis not present

## 2016-01-16 NOTE — Progress Notes (Signed)
Subjective:  [name] is a 65 y.o. year old very pleasant male patient who presents for/with See problem oriented charting ROS- no fever, chills. No chest pain or shortness of breath.see any ROS included in HPI as well.   Past Medical History-  Patient Active Problem List   Diagnosis Date Noted  . 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 Renal transplant recipient 06/27/2015    Priority: High  . Ascending aorta dilatation-4.7 cm per [facility] 2014 10/23/2014    Priority: High  . Diabetic neuropathy ([place]) 10/14/2010    Priority: High  . CAD -s/p DES to marginal vessel at [facility] 04/09/2009    Priority: High  . Diabetes mellitus due to underlying condition with diabetic autonomic (poly)neuropathy ([place]) 09/05/2007    Priority: High  . Gout 12/22/2009    Priority: Medium  . Obstructive sleep apnea 07/10/2009    Priority: Medium  . BPH (benign prostatic hyperplasia) 09/25/2008    Priority: Medium  . Low back pain 12/15/2007    Priority: Medium  . Posttraumatic stress disorder 11/03/2007    Priority: Medium  . Essential hypertension 01/17/2007    Priority: Medium  . CVA (cerebral infarction) 01/17/2007    Priority: Medium  . GERD (gastroesophageal reflux disease) 05/16/2014    Priority: Low  . Trigger thumb of right hand 01/03/2014    Priority: Low  . Left foot drop 05/02/2013    Priority: Low  . Risk for falls 01/22/2012    Priority: Low  . Gastroparesis 06/19/2008    Priority: Low  . Allergic rhinitis 03/01/2007    Priority: Low  . Hyperlipidemia 01/16/2016  . Orthostatic hypotension 01/15/2015  . Fall at home 01/15/2015    Medications- reviewed and updated Current Outpatient Prescriptions  Medication Sig Dispense Refill  . ACCU-CHEK SOFTCLIX LANCETS lancets Use three times daily 200 each 3  . allopurinol (ZYLOPRIM) 100 MG tablet Take 100 mg by mouth 2 (two) times daily.     [name] aspirin 81 MG  tablet Take 81 mg by mouth at bedtime.     [name] atorvastatin (LIPITOR) 80 MG tablet Take 40 mg by mouth daily.    . calcium acetate (PHOSLO) 667 MG capsule Take 2,668 mg by mouth 3 (three) times daily with meals.     . cinacalcet (SENSIPAR) 60 MG tablet Take 60 mg by mouth every other day.     . clopidogrel (PLAVIX) 75 MG tablet Take 75 mg by mouth daily.     . diazepam (VALIUM) 5 MG tablet Take 1 tablet (5 mg total) by mouth at bedtime. 30 tablet 0  . furosemide (LASIX) 40 MG tablet Take 40 mg by mouth 2 (two) times daily. 80mg  in the AM, 40 mg in the PM    . gabapentin (NEURONTIN) 100 MG capsule Take 1 capsule (100 mg total) by mouth daily. (Patient taking differently: Take 300 mg by mouth 3 (three) times daily. 300mg  in the AM, 300mg  mid day, and 300mg  PM) 30 capsule 3  . glucose blood (ACCU-CHEK AVIVA PLUS) test strip Test twice a day 100 each 2  . insulin glargine (LANTUS) 100 UNIT/ML injection Inject 25 Units into the skin 2 (two) times daily.     . Insulin Lispro (HUMALOG [place]) Inject 7 Units/day into the skin 3 (three) times daily before meals.    . Magnesium 200 MG TABS Take 200 mg by mouth daily. 2 tablets after lunch    . multivitamin (  RENA-VIT) TABS tablet Take 1 tablet by mouth every morning.     . mycophenolate (MYFORTIC) 180 MG EC tablet Take 180 mg by mouth 2 (two) times daily.    [name] omeprazole (PRILOSEC) 20 MG capsule Take 20 mg by mouth daily.    . solifenacin (VESICARE) 5 MG tablet Take 5 mg by mouth every morning.     . Tacrolimus 1 MG CP24 Take 3 mg by mouth 2 (two) times daily.    . Tamsulosin HCl (FLOMAX) 0.4 MG CAPS Take 0.4 mg by mouth every morning.     . topiramate (TOPAMAX) 100 MG tablet TAKE 1 TABLET (100 MG TOTAL) BY MOUTH 2 (TWO) TIMES DAILY. (Patient taking differently: Take 100 mg by mouth daily. ) 90 tablet 1  . traMADol (ULTRAM) 50 MG tablet Take 1 tablet (50 mg total) by mouth every 8 (eight) hours as needed. For pain. Take everyday per patient (Patient taking  differently: Take 50 mg by mouth every 8 (eight) hours as needed (takes with amoxicillin). For pain. Take everyday per patient) 30 tablet 1   No current facility-administered medications for this visit.     Objective: BP (!) 96/56 (BP Location: Right Arm, Patient Position: Sitting, Cuff Size: Large)   Pulse 83   Temp 98.5 F (36.9 C) (Oral)   Wt 237 lb 3.2 oz (107.6 kg)   SpO2 95%   BMI 34.03 kg/m  Gen: NAD, resting comfortably Tongue and visible pharynx normal- difficult to get full view CV: RRR no murmurs rubs or gallops Lungs: CTAB no crackles, wheeze, rhonchi Abdomen: soft/nontender/nondistended/normal bowel sounds. obese Ext: 1+ pitting edema Skin: warm, dry, no rash Neuro: grossly normal, moves all extremities, walks with walker  Assessment/Plan:  _____________________________________________________________________________ HPI from transplant clinic [facility] 12/20/2015: [name] is seen in post transplant clinic. Since his last visit he was hospitalized 7/19-7/20/2017 due to a syncopal episode. [name] was getting out of the shower when he bent over to dry his feet and he says that he must have been bent over too long, because he passed out. He says that this occurred at 11AM and that he was out for some time and family member found him about 2PM. They called the [facility] and he was brought to the ER at [facility]. He has had ongoing asymptomatic orthostasis and has not previously passed out. He also was suffering from back pain at the time and believes that this contributed to the problem. He was admitted to the hospital and underwent an evaluation including CT head, CT, Spine, MR Spine, Xrays of left hip and thigh/femur. There were no acute changes noted. Troponin levels were not elevated, EKG and Echo were unchanged.   He reports that he is feeling better. He has been taking Gabapentin and Tramadol for back pain and his Plavix is on HOLD for a  week so he can steroid injections in to his back on Monday, July 31. He has been taking his Gabapentin BID, though it is ordered TID and wants to know if it is ok for him to go up. He says that his BP has been stable. His BG has been runnng 112-180 in the AM, and averaging 200-250 in the PM but has had some highs of 400-500. He says that he was told to ask for an Endocrinology referral. He continues to have edema and is taking lasix 80 mg BID. He does not take anything for BP. He is concerned because his weigh tis  up. The patient denies fever, chills, chest pain, SOB, nausea, vomiting, diarrhea, hematuria. Reports compliance with medication regimen.   A/P Immunosuppression: Pt status post Campath induction. Continue current regimen of tacrolimus and Mycophenolic acid. Myfortic 360 mg bid. May need to lower if has BK in serum (previously lowered due to low level CMV). Pt on prednisone free regimen to help with diabetes control. Goal FK level: 6-8  Volume status: Stable edema. The patient is drinking 2 liters of water daily. Continue lasix 80mg  BID.  ID prophylaxis: Bactrim long term for PCP prophylaxis.   Low positive CMV: pt asymptomatic. Valcyte stopped. Will check CMV pcr today.  Leukopenia. Low positive CMV+Drug effect. Will monitor.  ...  DM type II: Assessed as uncontrolled. Referral to Endocrinology.    Peripheral neuropathy: Will cont gabapentin 600 mg. Have increased to TID.     Also from endocrinology a/p: #IDDM type 2-not at goal Given Mr. Clair's age and comorbidities, an A1C goal of 7-8% is appropriate. This correlates to an average blood sugar of 150-190. While he reports his fasting is close to this range, his premeal and nighttime readings have been 200-300's. -Recommend continuing Lantus 25 units BID -Increase meal time humalog to 14 units (eats 2 meals daily) -Patient will contact me with his blood sugar readings over the next week, sooner if he is concerned about  high's/low's.    Diabetic neuropathy ([place]) S: Patient has reasonable control on gabapentin 900mg  TID- has been titrated up since transplant- prior max dose 100mg .  A/P: continue current medications, continue use of walker given continued gait stability issues   Diabetes mellitus due to underlying condition with diabetic autonomic (poly)neuropathy ([place]) S: still with some poor control but doing much better with less insulin load now lantus 25 units BID and short acting 14 units BID (only eats twice usually) A/P: [facility] by [facility]. Patient needs weight loss as able, physical activity will be difficult with his back.    CAD -s/p DES to marginal vessel at [facility] S: history MI with stents. Compliant with aspirin, plavix, atorvastatin 40mg . Denies chest pain A/P: patient does report recent syncopal episode- having complete workup through renal transplant center. Wearing heart monitor at present and awaiting reading. Will continue aspirin, plavix, statin. Await results of work up.   Essential hypertension S: controlled. On Lasix 80mg  BID, Metoprolol in past.  BP Readings from Last 3 Encounters:  01/16/16 (!) 96/56  12/23/15 (!) 145/72  06/06/15 126/74  A/P:Continue current meds:  BP well controlled- perhaps still has edema but still has evidence of fluid overload on legs so likely cannot decrease and no lightheadedness/dizziness today   Filled out form for him to return to bachelor's in divinity. Will be doing at home so do not think medical issues will interfere.   Also complains of sore throat starting today. Had 2 weeks of symptoms that resolved for 5 days. He is to monitor with return precautions given.   Meds ordered this encounter  Medications  . Tacrolimus 1 MG CP24    Sig: Take 3 mg by mouth 2 (two) times daily.  . mycophenolate (MYFORTIC) 180 MG EC tablet    Sig: Take 180 mg by mouth 2 (two) times daily.  . Insulin Lispro (HUMALOG [place])    Sig: Inject 7  Units/day into the skin 3 (three) times daily before meals.  . furosemide (LASIX) 40 MG tablet    Sig: Take 40 mg by mouth 2 (two) times daily. 80mg  in  the AM, 40 mg in the PM  . Magnesium 200 MG TABS    Sig: Take 200 mg by mouth daily. 2 tablets after lunch    Garret Reddish, MD

## 2016-01-16 NOTE — Assessment & Plan Note (Addendum)
S: Patient has reasonable control on gabapentin 900mg  TID- has been titrated up since transplant- prior max dose 100mg .  A/P: continue current medications, continue use of walker given continued gait stability issues

## 2016-01-16 NOTE — Assessment & Plan Note (Signed)
S: controlled. On Lasix 80mg  BID, Metoprolol in past.  BP Readings from Last 3 Encounters:  01/16/16 (!) 96/56  12/23/15 (!) 145/72  06/06/15 126/74  A/P:Continue current meds:  BP well controlled- perhaps still has edema but still has evidence of fluid overload on legs so likely cannot decrease and no lightheadedness/dizziness today

## 2016-01-16 NOTE — Progress Notes (Signed)
Pre visit review using our clinic review tool, if applicable. No additional management support is needed unless otherwise documented below in the visit note. 

## 2016-01-16 NOTE — Assessment & Plan Note (Signed)
S: still with some poor control but doing much better with less insulin load now lantus 25 units BID and short acting 14 units BID (only eats twice usually) A/P: followed by [facility]. Patient needs weight loss as able, physical activity will be difficult with his back.

## 2016-01-16 NOTE — Assessment & Plan Note (Signed)
S: history MI with stents. Compliant with aspirin, plavix, atorvastatin 40mg . Denies chest pain A/P: patient does report recent syncopal episode- having complete workup through renal transplant center. Wearing heart monitor at present and awaiting reading. Will continue aspirin, plavix, statin. Await results of work up.

## 2016-01-16 NOTE — Patient Instructions (Signed)
Good luck in school!   Glad we got to see you and update record here  If sore throat lasts for over 2 weeks or you have fever- let us know and we can refer to ENT

## 2016-01-17 DIAGNOSIS — B259 Cytomegaloviral disease, unspecified: Secondary | ICD-10-CM | POA: Diagnosis not present

## 2016-01-17 DIAGNOSIS — Z8673 Personal history of transient ischemic attack (TIA), and cerebral infarction without residual deficits: Secondary | ICD-10-CM | POA: Diagnosis not present

## 2016-01-17 DIAGNOSIS — E1140 Type 2 diabetes mellitus with diabetic neuropathy, unspecified: Secondary | ICD-10-CM | POA: Diagnosis not present

## 2016-01-17 DIAGNOSIS — Z7902 Long term (current) use of antithrombotics/antiplatelets: Secondary | ICD-10-CM | POA: Diagnosis not present

## 2016-01-17 DIAGNOSIS — D899 Disorder involving the immune mechanism, unspecified: Secondary | ICD-10-CM | POA: Diagnosis not present

## 2016-01-17 DIAGNOSIS — I120 Hypertensive chronic kidney disease with stage 5 chronic kidney disease or end stage renal disease: Secondary | ICD-10-CM | POA: Diagnosis not present

## 2016-01-17 DIAGNOSIS — Z9989 Dependence on other enabling machines and devices: Secondary | ICD-10-CM | POA: Diagnosis not present

## 2016-01-17 DIAGNOSIS — R5081 Fever presenting with conditions classified elsewhere: Secondary | ICD-10-CM | POA: Diagnosis not present

## 2016-01-17 DIAGNOSIS — M109 Gout, unspecified: Secondary | ICD-10-CM | POA: Diagnosis not present

## 2016-01-17 DIAGNOSIS — Z4822 Encounter for aftercare following kidney transplant: Secondary | ICD-10-CM | POA: Diagnosis not present

## 2016-01-17 DIAGNOSIS — Z7982 Long term (current) use of aspirin: Secondary | ICD-10-CM | POA: Diagnosis not present

## 2016-01-17 DIAGNOSIS — Z79899 Other long term (current) drug therapy: Secondary | ICD-10-CM | POA: Diagnosis not present

## 2016-01-17 DIAGNOSIS — E119 Type 2 diabetes mellitus without complications: Secondary | ICD-10-CM | POA: Diagnosis not present

## 2016-01-17 DIAGNOSIS — D631 Anemia in chronic kidney disease: Secondary | ICD-10-CM | POA: Diagnosis not present

## 2016-01-17 DIAGNOSIS — R070 Pain in throat: Secondary | ICD-10-CM | POA: Diagnosis not present

## 2016-01-17 DIAGNOSIS — I2510 Atherosclerotic heart disease of native coronary artery without angina pectoris: Secondary | ICD-10-CM | POA: Diagnosis not present

## 2016-01-17 DIAGNOSIS — M549 Dorsalgia, unspecified: Secondary | ICD-10-CM | POA: Diagnosis not present

## 2016-01-17 DIAGNOSIS — Z940 Kidney transplant status: Secondary | ICD-10-CM | POA: Diagnosis not present

## 2016-01-17 DIAGNOSIS — N186 End stage renal disease: Secondary | ICD-10-CM | POA: Diagnosis not present

## 2016-01-17 DIAGNOSIS — E1122 Type 2 diabetes mellitus with diabetic chronic kidney disease: Secondary | ICD-10-CM | POA: Diagnosis not present

## 2016-01-17 DIAGNOSIS — I951 Orthostatic hypotension: Secondary | ICD-10-CM | POA: Diagnosis not present

## 2016-01-17 DIAGNOSIS — G4733 Obstructive sleep apnea (adult) (pediatric): Secondary | ICD-10-CM | POA: Diagnosis not present

## 2016-01-17 DIAGNOSIS — Z794 Long term (current) use of insulin: Secondary | ICD-10-CM | POA: Diagnosis not present

## 2016-01-17 DIAGNOSIS — Z955 Presence of coronary angioplasty implant and graft: Secondary | ICD-10-CM | POA: Diagnosis not present

## 2016-01-17 DIAGNOSIS — E785 Hyperlipidemia, unspecified: Secondary | ICD-10-CM | POA: Diagnosis not present

## 2016-01-24 DIAGNOSIS — Z79899 Other long term (current) drug therapy: Secondary | ICD-10-CM | POA: Diagnosis not present

## 2016-01-24 DIAGNOSIS — D899 Disorder involving the immune mechanism, unspecified: Secondary | ICD-10-CM | POA: Diagnosis not present

## 2016-01-24 DIAGNOSIS — J029 Acute pharyngitis, unspecified: Secondary | ICD-10-CM | POA: Diagnosis not present

## 2016-01-24 DIAGNOSIS — Z940 Kidney transplant status: Secondary | ICD-10-CM | POA: Diagnosis not present

## 2016-01-24 DIAGNOSIS — Z4822 Encounter for aftercare following kidney transplant: Secondary | ICD-10-CM | POA: Diagnosis not present

## 2016-01-24 DIAGNOSIS — D8989 Other specified disorders involving the immune mechanism, not elsewhere classified: Secondary | ICD-10-CM | POA: Diagnosis not present

## 2016-01-24 DIAGNOSIS — I10 Essential (primary) hypertension: Secondary | ICD-10-CM | POA: Diagnosis not present

## 2016-01-24 DIAGNOSIS — E785 Hyperlipidemia, unspecified: Secondary | ICD-10-CM | POA: Diagnosis not present

## 2016-01-24 DIAGNOSIS — B259 Cytomegaloviral disease, unspecified: Secondary | ICD-10-CM | POA: Diagnosis not present

## 2016-01-24 DIAGNOSIS — G629 Polyneuropathy, unspecified: Secondary | ICD-10-CM | POA: Diagnosis not present

## 2016-01-24 DIAGNOSIS — E119 Type 2 diabetes mellitus without complications: Secondary | ICD-10-CM | POA: Diagnosis not present

## 2016-01-24 DIAGNOSIS — M549 Dorsalgia, unspecified: Secondary | ICD-10-CM | POA: Diagnosis not present

## 2016-01-24 DIAGNOSIS — M109 Gout, unspecified: Secondary | ICD-10-CM | POA: Diagnosis not present

## 2016-01-24 DIAGNOSIS — Z794 Long term (current) use of insulin: Secondary | ICD-10-CM | POA: Diagnosis not present

## 2016-01-24 DIAGNOSIS — D649 Anemia, unspecified: Secondary | ICD-10-CM | POA: Diagnosis not present

## 2016-01-24 DIAGNOSIS — Z7982 Long term (current) use of aspirin: Secondary | ICD-10-CM | POA: Diagnosis not present

## 2016-01-24 DIAGNOSIS — I2510 Atherosclerotic heart disease of native coronary artery without angina pectoris: Secondary | ICD-10-CM | POA: Diagnosis not present

## 2016-01-28 DIAGNOSIS — D631 Anemia in chronic kidney disease: Secondary | ICD-10-CM | POA: Diagnosis not present

## 2016-01-28 DIAGNOSIS — N186 End stage renal disease: Secondary | ICD-10-CM | POA: Diagnosis not present

## 2016-01-28 DIAGNOSIS — E119 Type 2 diabetes mellitus without complications: Secondary | ICD-10-CM | POA: Diagnosis not present

## 2016-01-28 DIAGNOSIS — D899 Disorder involving the immune mechanism, unspecified: Secondary | ICD-10-CM | POA: Diagnosis not present

## 2016-01-28 DIAGNOSIS — J029 Acute pharyngitis, unspecified: Secondary | ICD-10-CM | POA: Diagnosis not present

## 2016-01-28 DIAGNOSIS — I120 Hypertensive chronic kidney disease with stage 5 chronic kidney disease or end stage renal disease: Secondary | ICD-10-CM | POA: Diagnosis not present

## 2016-01-28 DIAGNOSIS — E1140 Type 2 diabetes mellitus with diabetic neuropathy, unspecified: Secondary | ICD-10-CM | POA: Diagnosis not present

## 2016-01-28 DIAGNOSIS — M109 Gout, unspecified: Secondary | ICD-10-CM | POA: Diagnosis not present

## 2016-01-28 DIAGNOSIS — I10 Essential (primary) hypertension: Secondary | ICD-10-CM | POA: Diagnosis not present

## 2016-01-28 DIAGNOSIS — I2510 Atherosclerotic heart disease of native coronary artery without angina pectoris: Secondary | ICD-10-CM | POA: Diagnosis not present

## 2016-01-28 DIAGNOSIS — Z79899 Other long term (current) drug therapy: Secondary | ICD-10-CM | POA: Diagnosis not present

## 2016-01-28 DIAGNOSIS — Z940 Kidney transplant status: Secondary | ICD-10-CM | POA: Diagnosis not present

## 2016-01-28 DIAGNOSIS — E1122 Type 2 diabetes mellitus with diabetic chronic kidney disease: Secondary | ICD-10-CM | POA: Diagnosis not present

## 2016-01-28 DIAGNOSIS — E1342 Other specified diabetes mellitus with diabetic polyneuropathy: Secondary | ICD-10-CM | POA: Diagnosis not present

## 2016-01-28 DIAGNOSIS — E042 Nontoxic multinodular goiter: Secondary | ICD-10-CM | POA: Diagnosis not present

## 2016-01-28 DIAGNOSIS — Z7982 Long term (current) use of aspirin: Secondary | ICD-10-CM | POA: Diagnosis not present

## 2016-01-28 DIAGNOSIS — E785 Hyperlipidemia, unspecified: Secondary | ICD-10-CM | POA: Diagnosis not present

## 2016-01-28 DIAGNOSIS — Z7902 Long term (current) use of antithrombotics/antiplatelets: Secondary | ICD-10-CM | POA: Diagnosis not present

## 2016-01-28 DIAGNOSIS — R1310 Dysphagia, unspecified: Secondary | ICD-10-CM | POA: Diagnosis not present

## 2016-01-28 DIAGNOSIS — Z794 Long term (current) use of insulin: Secondary | ICD-10-CM | POA: Diagnosis not present

## 2016-01-28 DIAGNOSIS — I69391 Dysphagia following cerebral infarction: Secondary | ICD-10-CM | POA: Diagnosis not present

## 2016-02-20 DIAGNOSIS — Z8673 Personal history of transient ischemic attack (TIA), and cerebral infarction without residual deficits: Secondary | ICD-10-CM | POA: Diagnosis not present

## 2016-02-20 DIAGNOSIS — B349 Viral infection, unspecified: Secondary | ICD-10-CM | POA: Diagnosis not present

## 2016-02-20 DIAGNOSIS — Z79899 Other long term (current) drug therapy: Secondary | ICD-10-CM | POA: Diagnosis not present

## 2016-02-20 DIAGNOSIS — E119 Type 2 diabetes mellitus without complications: Secondary | ICD-10-CM | POA: Diagnosis not present

## 2016-02-20 DIAGNOSIS — I2510 Atherosclerotic heart disease of native coronary artery without angina pectoris: Secondary | ICD-10-CM | POA: Diagnosis not present

## 2016-02-20 DIAGNOSIS — Z955 Presence of coronary angioplasty implant and graft: Secondary | ICD-10-CM | POA: Diagnosis not present

## 2016-02-20 DIAGNOSIS — Z794 Long term (current) use of insulin: Secondary | ICD-10-CM | POA: Diagnosis not present

## 2016-02-20 DIAGNOSIS — M109 Gout, unspecified: Secondary | ICD-10-CM | POA: Diagnosis not present

## 2016-02-20 DIAGNOSIS — N400 Enlarged prostate without lower urinary tract symptoms: Secondary | ICD-10-CM | POA: Diagnosis not present

## 2016-02-20 DIAGNOSIS — B348 Other viral infections of unspecified site: Secondary | ICD-10-CM | POA: Insufficient documentation

## 2016-02-20 DIAGNOSIS — G629 Polyneuropathy, unspecified: Secondary | ICD-10-CM | POA: Diagnosis not present

## 2016-02-20 DIAGNOSIS — G4733 Obstructive sleep apnea (adult) (pediatric): Secondary | ICD-10-CM | POA: Diagnosis not present

## 2016-02-20 DIAGNOSIS — I252 Old myocardial infarction: Secondary | ICD-10-CM | POA: Diagnosis not present

## 2016-02-20 DIAGNOSIS — E785 Hyperlipidemia, unspecified: Secondary | ICD-10-CM | POA: Diagnosis not present

## 2016-02-20 DIAGNOSIS — I10 Essential (primary) hypertension: Secondary | ICD-10-CM | POA: Diagnosis not present

## 2016-02-20 DIAGNOSIS — Z940 Kidney transplant status: Secondary | ICD-10-CM | POA: Diagnosis not present

## 2016-02-25 DIAGNOSIS — M109 Gout, unspecified: Secondary | ICD-10-CM | POA: Diagnosis not present

## 2016-02-25 DIAGNOSIS — E041 Nontoxic single thyroid nodule: Secondary | ICD-10-CM | POA: Diagnosis not present

## 2016-02-25 DIAGNOSIS — E785 Hyperlipidemia, unspecified: Secondary | ICD-10-CM | POA: Diagnosis not present

## 2016-02-25 DIAGNOSIS — D899 Disorder involving the immune mechanism, unspecified: Secondary | ICD-10-CM | POA: Diagnosis not present

## 2016-02-25 DIAGNOSIS — I10 Essential (primary) hypertension: Secondary | ICD-10-CM | POA: Diagnosis not present

## 2016-02-25 DIAGNOSIS — Z79899 Other long term (current) drug therapy: Secondary | ICD-10-CM | POA: Diagnosis not present

## 2016-02-25 DIAGNOSIS — R600 Localized edema: Secondary | ICD-10-CM | POA: Diagnosis not present

## 2016-02-25 DIAGNOSIS — D8989 Other specified disorders involving the immune mechanism, not elsewhere classified: Secondary | ICD-10-CM | POA: Diagnosis not present

## 2016-02-25 DIAGNOSIS — Z794 Long term (current) use of insulin: Secondary | ICD-10-CM | POA: Diagnosis not present

## 2016-02-25 DIAGNOSIS — E119 Type 2 diabetes mellitus without complications: Secondary | ICD-10-CM | POA: Diagnosis not present

## 2016-02-25 DIAGNOSIS — Z940 Kidney transplant status: Secondary | ICD-10-CM | POA: Diagnosis not present

## 2016-02-25 DIAGNOSIS — G629 Polyneuropathy, unspecified: Secondary | ICD-10-CM | POA: Diagnosis not present

## 2016-02-25 DIAGNOSIS — D72819 Decreased white blood cell count, unspecified: Secondary | ICD-10-CM | POA: Diagnosis not present

## 2016-02-25 DIAGNOSIS — I2510 Atherosclerotic heart disease of native coronary artery without angina pectoris: Secondary | ICD-10-CM | POA: Diagnosis not present

## 2016-02-25 DIAGNOSIS — Z7982 Long term (current) use of aspirin: Secondary | ICD-10-CM | POA: Diagnosis not present

## 2016-02-25 DIAGNOSIS — I951 Orthostatic hypotension: Secondary | ICD-10-CM | POA: Diagnosis not present

## 2016-02-25 DIAGNOSIS — B349 Viral infection, unspecified: Secondary | ICD-10-CM | POA: Diagnosis not present

## 2016-02-25 DIAGNOSIS — M549 Dorsalgia, unspecified: Secondary | ICD-10-CM | POA: Diagnosis not present

## 2016-02-25 DIAGNOSIS — B259 Cytomegaloviral disease, unspecified: Secondary | ICD-10-CM | POA: Diagnosis not present

## 2016-02-25 DIAGNOSIS — Z4822 Encounter for aftercare following kidney transplant: Secondary | ICD-10-CM | POA: Diagnosis not present

## 2016-03-09 DIAGNOSIS — R1310 Dysphagia, unspecified: Secondary | ICD-10-CM | POA: Diagnosis not present

## 2016-03-12 DIAGNOSIS — Z794 Long term (current) use of insulin: Secondary | ICD-10-CM | POA: Diagnosis not present

## 2016-03-12 DIAGNOSIS — D899 Disorder involving the immune mechanism, unspecified: Secondary | ICD-10-CM | POA: Diagnosis not present

## 2016-03-12 DIAGNOSIS — I10 Essential (primary) hypertension: Secondary | ICD-10-CM | POA: Diagnosis not present

## 2016-03-12 DIAGNOSIS — E785 Hyperlipidemia, unspecified: Secondary | ICD-10-CM | POA: Diagnosis not present

## 2016-03-12 DIAGNOSIS — Z87442 Personal history of urinary calculi: Secondary | ICD-10-CM | POA: Diagnosis not present

## 2016-03-12 DIAGNOSIS — E119 Type 2 diabetes mellitus without complications: Secondary | ICD-10-CM | POA: Diagnosis not present

## 2016-03-12 DIAGNOSIS — N189 Chronic kidney disease, unspecified: Secondary | ICD-10-CM | POA: Diagnosis not present

## 2016-03-12 DIAGNOSIS — Z8673 Personal history of transient ischemic attack (TIA), and cerebral infarction without residual deficits: Secondary | ICD-10-CM | POA: Diagnosis not present

## 2016-03-12 DIAGNOSIS — I2510 Atherosclerotic heart disease of native coronary artery without angina pectoris: Secondary | ICD-10-CM | POA: Diagnosis not present

## 2016-03-12 DIAGNOSIS — Z79899 Other long term (current) drug therapy: Secondary | ICD-10-CM | POA: Diagnosis not present

## 2016-03-12 DIAGNOSIS — Z940 Kidney transplant status: Secondary | ICD-10-CM | POA: Diagnosis not present

## 2016-03-12 DIAGNOSIS — I252 Old myocardial infarction: Secondary | ICD-10-CM | POA: Diagnosis not present

## 2016-03-12 DIAGNOSIS — B349 Viral infection, unspecified: Secondary | ICD-10-CM | POA: Diagnosis not present

## 2016-03-12 DIAGNOSIS — D631 Anemia in chronic kidney disease: Secondary | ICD-10-CM | POA: Diagnosis not present

## 2016-03-12 DIAGNOSIS — G4733 Obstructive sleep apnea (adult) (pediatric): Secondary | ICD-10-CM | POA: Diagnosis not present

## 2016-03-19 DIAGNOSIS — I739 Peripheral vascular disease, unspecified: Secondary | ICD-10-CM | POA: Diagnosis not present

## 2016-03-19 DIAGNOSIS — B349 Viral infection, unspecified: Secondary | ICD-10-CM | POA: Diagnosis not present

## 2016-03-19 DIAGNOSIS — E119 Type 2 diabetes mellitus without complications: Secondary | ICD-10-CM | POA: Diagnosis not present

## 2016-03-19 DIAGNOSIS — J029 Acute pharyngitis, unspecified: Secondary | ICD-10-CM | POA: Insufficient documentation

## 2016-03-19 DIAGNOSIS — Z79899 Other long term (current) drug therapy: Secondary | ICD-10-CM | POA: Diagnosis not present

## 2016-03-19 DIAGNOSIS — D899 Disorder involving the immune mechanism, unspecified: Secondary | ICD-10-CM | POA: Diagnosis not present

## 2016-03-19 DIAGNOSIS — I10 Essential (primary) hypertension: Secondary | ICD-10-CM | POA: Diagnosis not present

## 2016-03-19 DIAGNOSIS — Z940 Kidney transplant status: Secondary | ICD-10-CM | POA: Diagnosis not present

## 2016-03-19 DIAGNOSIS — R1310 Dysphagia, unspecified: Secondary | ICD-10-CM | POA: Diagnosis not present

## 2016-03-19 DIAGNOSIS — R0609 Other forms of dyspnea: Secondary | ICD-10-CM | POA: Diagnosis not present

## 2016-03-19 DIAGNOSIS — M7989 Other specified soft tissue disorders: Secondary | ICD-10-CM | POA: Diagnosis not present

## 2016-03-19 DIAGNOSIS — K219 Gastro-esophageal reflux disease without esophagitis: Secondary | ICD-10-CM | POA: Diagnosis not present

## 2016-03-19 DIAGNOSIS — R509 Fever, unspecified: Secondary | ICD-10-CM | POA: Diagnosis not present

## 2016-03-20 DIAGNOSIS — W19XXXD Unspecified fall, subsequent encounter: Secondary | ICD-10-CM | POA: Diagnosis not present

## 2016-03-20 DIAGNOSIS — Z79899 Other long term (current) drug therapy: Secondary | ICD-10-CM | POA: Diagnosis not present

## 2016-03-20 DIAGNOSIS — R296 Repeated falls: Secondary | ICD-10-CM | POA: Diagnosis not present

## 2016-03-20 DIAGNOSIS — R0609 Other forms of dyspnea: Secondary | ICD-10-CM | POA: Diagnosis not present

## 2016-03-20 DIAGNOSIS — K317 Polyp of stomach and duodenum: Secondary | ICD-10-CM | POA: Diagnosis not present

## 2016-03-20 DIAGNOSIS — I69911 Memory deficit following unspecified cerebrovascular disease: Secondary | ICD-10-CM | POA: Diagnosis not present

## 2016-03-20 DIAGNOSIS — Z4822 Encounter for aftercare following kidney transplant: Secondary | ICD-10-CM | POA: Diagnosis not present

## 2016-03-20 DIAGNOSIS — Z5181 Encounter for therapeutic drug level monitoring: Secondary | ICD-10-CM | POA: Diagnosis not present

## 2016-03-20 DIAGNOSIS — K2980 Duodenitis without bleeding: Secondary | ICD-10-CM | POA: Diagnosis not present

## 2016-03-20 DIAGNOSIS — I120 Hypertensive chronic kidney disease with stage 5 chronic kidney disease or end stage renal disease: Secondary | ICD-10-CM | POA: Diagnosis not present

## 2016-03-20 DIAGNOSIS — K3189 Other diseases of stomach and duodenum: Secondary | ICD-10-CM | POA: Diagnosis not present

## 2016-03-20 DIAGNOSIS — R55 Syncope and collapse: Secondary | ICD-10-CM | POA: Diagnosis not present

## 2016-03-20 DIAGNOSIS — Z940 Kidney transplant status: Secondary | ICD-10-CM | POA: Diagnosis not present

## 2016-03-21 DIAGNOSIS — Z4822 Encounter for aftercare following kidney transplant: Secondary | ICD-10-CM | POA: Diagnosis not present

## 2016-03-21 DIAGNOSIS — R0609 Other forms of dyspnea: Secondary | ICD-10-CM | POA: Diagnosis not present

## 2016-03-21 DIAGNOSIS — M7989 Other specified soft tissue disorders: Secondary | ICD-10-CM | POA: Diagnosis not present

## 2016-03-21 DIAGNOSIS — W19XXXA Unspecified fall, initial encounter: Secondary | ICD-10-CM | POA: Diagnosis not present

## 2016-03-21 DIAGNOSIS — Z940 Kidney transplant status: Secondary | ICD-10-CM | POA: Diagnosis not present

## 2016-03-21 DIAGNOSIS — Z5181 Encounter for therapeutic drug level monitoring: Secondary | ICD-10-CM | POA: Diagnosis not present

## 2016-03-27 DIAGNOSIS — I120 Hypertensive chronic kidney disease with stage 5 chronic kidney disease or end stage renal disease: Secondary | ICD-10-CM | POA: Diagnosis not present

## 2016-03-27 DIAGNOSIS — Z940 Kidney transplant status: Secondary | ICD-10-CM | POA: Diagnosis not present

## 2016-03-27 DIAGNOSIS — D72819 Decreased white blood cell count, unspecified: Secondary | ICD-10-CM | POA: Diagnosis not present

## 2016-03-27 DIAGNOSIS — Z79899 Other long term (current) drug therapy: Secondary | ICD-10-CM | POA: Diagnosis not present

## 2016-03-27 DIAGNOSIS — Z955 Presence of coronary angioplasty implant and graft: Secondary | ICD-10-CM | POA: Diagnosis not present

## 2016-03-27 DIAGNOSIS — Z4822 Encounter for aftercare following kidney transplant: Secondary | ICD-10-CM | POA: Diagnosis not present

## 2016-03-27 DIAGNOSIS — B349 Viral infection, unspecified: Secondary | ICD-10-CM | POA: Diagnosis not present

## 2016-03-27 DIAGNOSIS — D899 Disorder involving the immune mechanism, unspecified: Secondary | ICD-10-CM | POA: Diagnosis not present

## 2016-03-27 DIAGNOSIS — E1140 Type 2 diabetes mellitus with diabetic neuropathy, unspecified: Secondary | ICD-10-CM | POA: Diagnosis not present

## 2016-03-27 DIAGNOSIS — E1122 Type 2 diabetes mellitus with diabetic chronic kidney disease: Secondary | ICD-10-CM | POA: Diagnosis not present

## 2016-03-27 DIAGNOSIS — E8770 Fluid overload, unspecified: Secondary | ICD-10-CM | POA: Diagnosis not present

## 2016-03-27 DIAGNOSIS — M109 Gout, unspecified: Secondary | ICD-10-CM | POA: Diagnosis not present

## 2016-03-27 DIAGNOSIS — I2510 Atherosclerotic heart disease of native coronary artery without angina pectoris: Secondary | ICD-10-CM | POA: Diagnosis not present

## 2016-03-27 DIAGNOSIS — Z7982 Long term (current) use of aspirin: Secondary | ICD-10-CM | POA: Diagnosis not present

## 2016-03-27 DIAGNOSIS — N186 End stage renal disease: Secondary | ICD-10-CM | POA: Diagnosis not present

## 2016-03-27 DIAGNOSIS — Z794 Long term (current) use of insulin: Secondary | ICD-10-CM | POA: Diagnosis not present

## 2016-03-27 DIAGNOSIS — E785 Hyperlipidemia, unspecified: Secondary | ICD-10-CM | POA: Diagnosis not present

## 2016-03-27 DIAGNOSIS — M549 Dorsalgia, unspecified: Secondary | ICD-10-CM | POA: Diagnosis not present

## 2016-03-27 DIAGNOSIS — Z8673 Personal history of transient ischemic attack (TIA), and cerebral infarction without residual deficits: Secondary | ICD-10-CM | POA: Diagnosis not present

## 2016-04-07 DIAGNOSIS — R531 Weakness: Secondary | ICD-10-CM | POA: Diagnosis not present

## 2016-04-07 DIAGNOSIS — R262 Difficulty in walking, not elsewhere classified: Secondary | ICD-10-CM | POA: Diagnosis not present

## 2016-04-07 DIAGNOSIS — R2689 Other abnormalities of gait and mobility: Secondary | ICD-10-CM | POA: Diagnosis not present

## 2016-04-07 DIAGNOSIS — Z7409 Other reduced mobility: Secondary | ICD-10-CM | POA: Diagnosis not present

## 2016-05-01 DIAGNOSIS — E785 Hyperlipidemia, unspecified: Secondary | ICD-10-CM | POA: Diagnosis not present

## 2016-05-01 DIAGNOSIS — Z7902 Long term (current) use of antithrombotics/antiplatelets: Secondary | ICD-10-CM | POA: Diagnosis not present

## 2016-05-01 DIAGNOSIS — Z8673 Personal history of transient ischemic attack (TIA), and cerebral infarction without residual deficits: Secondary | ICD-10-CM | POA: Diagnosis not present

## 2016-05-01 DIAGNOSIS — Z9989 Dependence on other enabling machines and devices: Secondary | ICD-10-CM | POA: Diagnosis not present

## 2016-05-01 DIAGNOSIS — D899 Disorder involving the immune mechanism, unspecified: Secondary | ICD-10-CM | POA: Diagnosis not present

## 2016-05-01 DIAGNOSIS — K219 Gastro-esophageal reflux disease without esophagitis: Secondary | ICD-10-CM | POA: Diagnosis not present

## 2016-05-01 DIAGNOSIS — M549 Dorsalgia, unspecified: Secondary | ICD-10-CM | POA: Diagnosis not present

## 2016-05-01 DIAGNOSIS — Z940 Kidney transplant status: Secondary | ICD-10-CM | POA: Diagnosis not present

## 2016-05-01 DIAGNOSIS — E1122 Type 2 diabetes mellitus with diabetic chronic kidney disease: Secondary | ICD-10-CM | POA: Diagnosis not present

## 2016-05-01 DIAGNOSIS — I120 Hypertensive chronic kidney disease with stage 5 chronic kidney disease or end stage renal disease: Secondary | ICD-10-CM | POA: Diagnosis not present

## 2016-05-01 DIAGNOSIS — Z794 Long term (current) use of insulin: Secondary | ICD-10-CM | POA: Diagnosis not present

## 2016-05-01 DIAGNOSIS — Z955 Presence of coronary angioplasty implant and graft: Secondary | ICD-10-CM | POA: Diagnosis not present

## 2016-05-01 DIAGNOSIS — E1140 Type 2 diabetes mellitus with diabetic neuropathy, unspecified: Secondary | ICD-10-CM | POA: Diagnosis not present

## 2016-05-01 DIAGNOSIS — I25110 Atherosclerotic heart disease of native coronary artery with unstable angina pectoris: Secondary | ICD-10-CM | POA: Diagnosis not present

## 2016-05-01 DIAGNOSIS — D631 Anemia in chronic kidney disease: Secondary | ICD-10-CM | POA: Diagnosis not present

## 2016-05-01 DIAGNOSIS — G4733 Obstructive sleep apnea (adult) (pediatric): Secondary | ICD-10-CM | POA: Diagnosis not present

## 2016-05-01 DIAGNOSIS — Z4822 Encounter for aftercare following kidney transplant: Secondary | ICD-10-CM | POA: Diagnosis not present

## 2016-05-01 DIAGNOSIS — M109 Gout, unspecified: Secondary | ICD-10-CM | POA: Diagnosis not present

## 2016-05-01 DIAGNOSIS — E119 Type 2 diabetes mellitus without complications: Secondary | ICD-10-CM | POA: Diagnosis not present

## 2016-05-01 DIAGNOSIS — N186 End stage renal disease: Secondary | ICD-10-CM | POA: Diagnosis not present

## 2016-05-01 DIAGNOSIS — B349 Viral infection, unspecified: Secondary | ICD-10-CM | POA: Diagnosis not present

## 2016-05-01 DIAGNOSIS — Z7982 Long term (current) use of aspirin: Secondary | ICD-10-CM | POA: Diagnosis not present

## 2016-05-01 DIAGNOSIS — Z23 Encounter for immunization: Secondary | ICD-10-CM | POA: Diagnosis not present

## 2016-05-01 DIAGNOSIS — Z79899 Other long term (current) drug therapy: Secondary | ICD-10-CM | POA: Diagnosis not present

## 2016-05-06 DIAGNOSIS — Z794 Long term (current) use of insulin: Secondary | ICD-10-CM | POA: Diagnosis not present

## 2016-05-06 DIAGNOSIS — E785 Hyperlipidemia, unspecified: Secondary | ICD-10-CM | POA: Diagnosis not present

## 2016-05-06 DIAGNOSIS — Z992 Dependence on renal dialysis: Secondary | ICD-10-CM | POA: Diagnosis not present

## 2016-05-06 DIAGNOSIS — R1312 Dysphagia, oropharyngeal phase: Secondary | ICD-10-CM | POA: Diagnosis not present

## 2016-05-06 DIAGNOSIS — E1122 Type 2 diabetes mellitus with diabetic chronic kidney disease: Secondary | ICD-10-CM | POA: Diagnosis not present

## 2016-05-06 DIAGNOSIS — N186 End stage renal disease: Secondary | ICD-10-CM | POA: Diagnosis not present

## 2016-05-06 DIAGNOSIS — Z7982 Long term (current) use of aspirin: Secondary | ICD-10-CM | POA: Diagnosis not present

## 2016-05-06 DIAGNOSIS — Z79899 Other long term (current) drug therapy: Secondary | ICD-10-CM | POA: Diagnosis not present

## 2016-05-06 DIAGNOSIS — E1142 Type 2 diabetes mellitus with diabetic polyneuropathy: Secondary | ICD-10-CM | POA: Diagnosis not present

## 2016-05-06 DIAGNOSIS — I252 Old myocardial infarction: Secondary | ICD-10-CM | POA: Diagnosis not present

## 2016-05-06 DIAGNOSIS — Z940 Kidney transplant status: Secondary | ICD-10-CM | POA: Diagnosis not present

## 2016-05-06 DIAGNOSIS — I712 Thoracic aortic aneurysm, without rupture: Secondary | ICD-10-CM | POA: Diagnosis not present

## 2016-05-15 DIAGNOSIS — Z940 Kidney transplant status: Secondary | ICD-10-CM | POA: Diagnosis not present

## 2016-05-29 DIAGNOSIS — Z955 Presence of coronary angioplasty implant and graft: Secondary | ICD-10-CM | POA: Diagnosis not present

## 2016-05-29 DIAGNOSIS — D72819 Decreased white blood cell count, unspecified: Secondary | ICD-10-CM | POA: Diagnosis not present

## 2016-05-29 DIAGNOSIS — M109 Gout, unspecified: Secondary | ICD-10-CM | POA: Diagnosis not present

## 2016-05-29 DIAGNOSIS — I2510 Atherosclerotic heart disease of native coronary artery without angina pectoris: Secondary | ICD-10-CM | POA: Diagnosis not present

## 2016-05-29 DIAGNOSIS — E785 Hyperlipidemia, unspecified: Secondary | ICD-10-CM | POA: Diagnosis not present

## 2016-05-29 DIAGNOSIS — Z79899 Other long term (current) drug therapy: Secondary | ICD-10-CM | POA: Diagnosis not present

## 2016-05-29 DIAGNOSIS — N183 Chronic kidney disease, stage 3 (moderate): Secondary | ICD-10-CM | POA: Diagnosis not present

## 2016-05-29 DIAGNOSIS — B349 Viral infection, unspecified: Secondary | ICD-10-CM | POA: Diagnosis not present

## 2016-05-29 DIAGNOSIS — I10 Essential (primary) hypertension: Secondary | ICD-10-CM | POA: Diagnosis not present

## 2016-05-29 DIAGNOSIS — Z940 Kidney transplant status: Secondary | ICD-10-CM | POA: Diagnosis not present

## 2016-05-29 DIAGNOSIS — Z4822 Encounter for aftercare following kidney transplant: Secondary | ICD-10-CM | POA: Diagnosis not present

## 2016-05-29 DIAGNOSIS — D649 Anemia, unspecified: Secondary | ICD-10-CM | POA: Diagnosis not present

## 2016-05-29 DIAGNOSIS — E1140 Type 2 diabetes mellitus with diabetic neuropathy, unspecified: Secondary | ICD-10-CM | POA: Diagnosis not present

## 2016-05-29 DIAGNOSIS — D631 Anemia in chronic kidney disease: Secondary | ICD-10-CM | POA: Diagnosis not present

## 2016-05-29 DIAGNOSIS — Z794 Long term (current) use of insulin: Secondary | ICD-10-CM | POA: Diagnosis not present

## 2016-06-18 DIAGNOSIS — E1142 Type 2 diabetes mellitus with diabetic polyneuropathy: Secondary | ICD-10-CM | POA: Diagnosis not present

## 2016-06-18 DIAGNOSIS — E785 Hyperlipidemia, unspecified: Secondary | ICD-10-CM | POA: Diagnosis not present

## 2016-06-18 DIAGNOSIS — Z4822 Encounter for aftercare following kidney transplant: Secondary | ICD-10-CM | POA: Diagnosis not present

## 2016-06-18 DIAGNOSIS — D899 Disorder involving the immune mechanism, unspecified: Secondary | ICD-10-CM | POA: Diagnosis not present

## 2016-06-18 DIAGNOSIS — Z8673 Personal history of transient ischemic attack (TIA), and cerebral infarction without residual deficits: Secondary | ICD-10-CM | POA: Diagnosis not present

## 2016-06-18 DIAGNOSIS — M549 Dorsalgia, unspecified: Secondary | ICD-10-CM | POA: Diagnosis not present

## 2016-06-18 DIAGNOSIS — Z955 Presence of coronary angioplasty implant and graft: Secondary | ICD-10-CM | POA: Diagnosis not present

## 2016-06-18 DIAGNOSIS — D649 Anemia, unspecified: Secondary | ICD-10-CM | POA: Diagnosis not present

## 2016-06-18 DIAGNOSIS — Z7952 Long term (current) use of systemic steroids: Secondary | ICD-10-CM | POA: Diagnosis not present

## 2016-06-18 DIAGNOSIS — E119 Type 2 diabetes mellitus without complications: Secondary | ICD-10-CM | POA: Diagnosis not present

## 2016-06-18 DIAGNOSIS — R070 Pain in throat: Secondary | ICD-10-CM | POA: Diagnosis not present

## 2016-06-18 DIAGNOSIS — M109 Gout, unspecified: Secondary | ICD-10-CM | POA: Diagnosis not present

## 2016-06-18 DIAGNOSIS — G3184 Mild cognitive impairment, so stated: Secondary | ICD-10-CM | POA: Diagnosis not present

## 2016-06-18 DIAGNOSIS — Z7982 Long term (current) use of aspirin: Secondary | ICD-10-CM | POA: Diagnosis not present

## 2016-06-18 DIAGNOSIS — G4733 Obstructive sleep apnea (adult) (pediatric): Secondary | ICD-10-CM | POA: Diagnosis not present

## 2016-06-18 DIAGNOSIS — D702 Other drug-induced agranulocytosis: Secondary | ICD-10-CM | POA: Diagnosis not present

## 2016-06-18 DIAGNOSIS — I10 Essential (primary) hypertension: Secondary | ICD-10-CM | POA: Diagnosis not present

## 2016-06-18 DIAGNOSIS — W19XXXD Unspecified fall, subsequent encounter: Secondary | ICD-10-CM | POA: Diagnosis not present

## 2016-06-18 DIAGNOSIS — B9789 Other viral agents as the cause of diseases classified elsewhere: Secondary | ICD-10-CM | POA: Diagnosis not present

## 2016-06-18 DIAGNOSIS — I200 Unstable angina: Secondary | ICD-10-CM | POA: Diagnosis not present

## 2016-06-18 DIAGNOSIS — N186 End stage renal disease: Secondary | ICD-10-CM | POA: Diagnosis not present

## 2016-06-18 DIAGNOSIS — Z940 Kidney transplant status: Secondary | ICD-10-CM | POA: Diagnosis not present

## 2016-06-18 DIAGNOSIS — I120 Hypertensive chronic kidney disease with stage 5 chronic kidney disease or end stage renal disease: Secondary | ICD-10-CM | POA: Diagnosis not present

## 2016-06-18 DIAGNOSIS — I2510 Atherosclerotic heart disease of native coronary artery without angina pectoris: Secondary | ICD-10-CM | POA: Diagnosis not present

## 2016-06-18 DIAGNOSIS — R413 Other amnesia: Secondary | ICD-10-CM | POA: Diagnosis not present

## 2016-06-18 DIAGNOSIS — Z794 Long term (current) use of insulin: Secondary | ICD-10-CM | POA: Diagnosis not present

## 2016-06-18 DIAGNOSIS — Z79899 Other long term (current) drug therapy: Secondary | ICD-10-CM | POA: Diagnosis not present

## 2016-06-18 DIAGNOSIS — Z7902 Long term (current) use of antithrombotics/antiplatelets: Secondary | ICD-10-CM | POA: Diagnosis not present

## 2016-07-16 DIAGNOSIS — Z7902 Long term (current) use of antithrombotics/antiplatelets: Secondary | ICD-10-CM | POA: Diagnosis not present

## 2016-07-16 DIAGNOSIS — E785 Hyperlipidemia, unspecified: Secondary | ICD-10-CM | POA: Diagnosis not present

## 2016-07-16 DIAGNOSIS — E1122 Type 2 diabetes mellitus with diabetic chronic kidney disease: Secondary | ICD-10-CM | POA: Diagnosis not present

## 2016-07-16 DIAGNOSIS — E1142 Type 2 diabetes mellitus with diabetic polyneuropathy: Secondary | ICD-10-CM | POA: Diagnosis not present

## 2016-07-16 DIAGNOSIS — Z79899 Other long term (current) drug therapy: Secondary | ICD-10-CM | POA: Diagnosis not present

## 2016-07-16 DIAGNOSIS — E119 Type 2 diabetes mellitus without complications: Secondary | ICD-10-CM | POA: Diagnosis not present

## 2016-07-16 DIAGNOSIS — B9789 Other viral agents as the cause of diseases classified elsewhere: Secondary | ICD-10-CM | POA: Diagnosis not present

## 2016-07-16 DIAGNOSIS — Z794 Long term (current) use of insulin: Secondary | ICD-10-CM | POA: Diagnosis not present

## 2016-07-16 DIAGNOSIS — Z955 Presence of coronary angioplasty implant and graft: Secondary | ICD-10-CM | POA: Diagnosis not present

## 2016-07-16 DIAGNOSIS — M109 Gout, unspecified: Secondary | ICD-10-CM | POA: Diagnosis not present

## 2016-07-16 DIAGNOSIS — G6289 Other specified polyneuropathies: Secondary | ICD-10-CM | POA: Diagnosis not present

## 2016-07-16 DIAGNOSIS — D899 Disorder involving the immune mechanism, unspecified: Secondary | ICD-10-CM | POA: Diagnosis not present

## 2016-07-16 DIAGNOSIS — Z8673 Personal history of transient ischemic attack (TIA), and cerebral infarction without residual deficits: Secondary | ICD-10-CM | POA: Diagnosis not present

## 2016-07-16 DIAGNOSIS — B349 Viral infection, unspecified: Secondary | ICD-10-CM | POA: Diagnosis not present

## 2016-07-16 DIAGNOSIS — I120 Hypertensive chronic kidney disease with stage 5 chronic kidney disease or end stage renal disease: Secondary | ICD-10-CM | POA: Diagnosis not present

## 2016-07-16 DIAGNOSIS — M549 Dorsalgia, unspecified: Secondary | ICD-10-CM | POA: Diagnosis not present

## 2016-07-16 DIAGNOSIS — Z4822 Encounter for aftercare following kidney transplant: Secondary | ICD-10-CM | POA: Diagnosis not present

## 2016-07-16 DIAGNOSIS — Z940 Kidney transplant status: Secondary | ICD-10-CM | POA: Diagnosis not present

## 2016-07-16 DIAGNOSIS — M1A0790 Idiopathic chronic gout, unspecified ankle and foot, without tophus (tophi): Secondary | ICD-10-CM | POA: Diagnosis not present

## 2016-07-16 DIAGNOSIS — I2510 Atherosclerotic heart disease of native coronary artery without angina pectoris: Secondary | ICD-10-CM | POA: Diagnosis not present

## 2016-07-16 DIAGNOSIS — D72819 Decreased white blood cell count, unspecified: Secondary | ICD-10-CM | POA: Diagnosis not present

## 2016-07-16 DIAGNOSIS — N186 End stage renal disease: Secondary | ICD-10-CM | POA: Diagnosis not present

## 2016-07-16 DIAGNOSIS — Z7982 Long term (current) use of aspirin: Secondary | ICD-10-CM | POA: Diagnosis not present

## 2016-07-20 ENCOUNTER — Other Ambulatory Visit: Payer: Self-pay | Admitting: Family Medicine

## 2016-08-13 DIAGNOSIS — N186 End stage renal disease: Secondary | ICD-10-CM | POA: Diagnosis not present

## 2016-08-13 DIAGNOSIS — I120 Hypertensive chronic kidney disease with stage 5 chronic kidney disease or end stage renal disease: Secondary | ICD-10-CM | POA: Diagnosis not present

## 2016-08-13 DIAGNOSIS — Z4822 Encounter for aftercare following kidney transplant: Secondary | ICD-10-CM | POA: Diagnosis not present

## 2016-08-13 DIAGNOSIS — E119 Type 2 diabetes mellitus without complications: Secondary | ICD-10-CM | POA: Diagnosis not present

## 2016-08-13 DIAGNOSIS — I2510 Atherosclerotic heart disease of native coronary artery without angina pectoris: Secondary | ICD-10-CM | POA: Diagnosis not present

## 2016-08-13 DIAGNOSIS — D631 Anemia in chronic kidney disease: Secondary | ICD-10-CM | POA: Diagnosis not present

## 2016-08-13 DIAGNOSIS — R682 Dry mouth, unspecified: Secondary | ICD-10-CM | POA: Diagnosis not present

## 2016-08-13 DIAGNOSIS — M109 Gout, unspecified: Secondary | ICD-10-CM | POA: Diagnosis not present

## 2016-08-13 DIAGNOSIS — I10 Essential (primary) hypertension: Secondary | ICD-10-CM | POA: Diagnosis not present

## 2016-08-13 DIAGNOSIS — B349 Viral infection, unspecified: Secondary | ICD-10-CM | POA: Diagnosis not present

## 2016-08-13 DIAGNOSIS — D8989 Other specified disorders involving the immune mechanism, not elsewhere classified: Secondary | ICD-10-CM | POA: Diagnosis not present

## 2016-08-13 DIAGNOSIS — Z940 Kidney transplant status: Secondary | ICD-10-CM | POA: Diagnosis not present

## 2016-08-13 DIAGNOSIS — E1122 Type 2 diabetes mellitus with diabetic chronic kidney disease: Secondary | ICD-10-CM | POA: Diagnosis not present

## 2016-08-13 DIAGNOSIS — Z8673 Personal history of transient ischemic attack (TIA), and cerebral infarction without residual deficits: Secondary | ICD-10-CM | POA: Diagnosis not present

## 2016-08-13 DIAGNOSIS — Z794 Long term (current) use of insulin: Secondary | ICD-10-CM | POA: Diagnosis not present

## 2016-08-13 DIAGNOSIS — D899 Disorder involving the immune mechanism, unspecified: Secondary | ICD-10-CM | POA: Diagnosis not present

## 2016-08-13 DIAGNOSIS — Z7982 Long term (current) use of aspirin: Secondary | ICD-10-CM | POA: Diagnosis not present

## 2016-08-13 DIAGNOSIS — Z79899 Other long term (current) drug therapy: Secondary | ICD-10-CM | POA: Diagnosis not present

## 2016-08-13 DIAGNOSIS — E785 Hyperlipidemia, unspecified: Secondary | ICD-10-CM | POA: Diagnosis not present

## 2016-08-13 DIAGNOSIS — Z955 Presence of coronary angioplasty implant and graft: Secondary | ICD-10-CM | POA: Diagnosis not present

## 2016-08-13 DIAGNOSIS — E1140 Type 2 diabetes mellitus with diabetic neuropathy, unspecified: Secondary | ICD-10-CM | POA: Diagnosis not present

## 2016-08-13 DIAGNOSIS — M549 Dorsalgia, unspecified: Secondary | ICD-10-CM | POA: Diagnosis not present

## 2016-09-10 DIAGNOSIS — Z79899 Other long term (current) drug therapy: Secondary | ICD-10-CM | POA: Diagnosis not present

## 2016-09-10 DIAGNOSIS — R070 Pain in throat: Secondary | ICD-10-CM | POA: Diagnosis not present

## 2016-09-10 DIAGNOSIS — Z955 Presence of coronary angioplasty implant and graft: Secondary | ICD-10-CM | POA: Diagnosis not present

## 2016-09-10 DIAGNOSIS — Z4822 Encounter for aftercare following kidney transplant: Secondary | ICD-10-CM | POA: Diagnosis not present

## 2016-09-10 DIAGNOSIS — E1122 Type 2 diabetes mellitus with diabetic chronic kidney disease: Secondary | ICD-10-CM | POA: Diagnosis not present

## 2016-09-10 DIAGNOSIS — M109 Gout, unspecified: Secondary | ICD-10-CM | POA: Diagnosis not present

## 2016-09-10 DIAGNOSIS — E119 Type 2 diabetes mellitus without complications: Secondary | ICD-10-CM | POA: Diagnosis not present

## 2016-09-10 DIAGNOSIS — I120 Hypertensive chronic kidney disease with stage 5 chronic kidney disease or end stage renal disease: Secondary | ICD-10-CM | POA: Diagnosis not present

## 2016-09-10 DIAGNOSIS — M549 Dorsalgia, unspecified: Secondary | ICD-10-CM | POA: Diagnosis not present

## 2016-09-10 DIAGNOSIS — R682 Dry mouth, unspecified: Secondary | ICD-10-CM | POA: Diagnosis not present

## 2016-09-10 DIAGNOSIS — D899 Disorder involving the immune mechanism, unspecified: Secondary | ICD-10-CM | POA: Diagnosis not present

## 2016-09-10 DIAGNOSIS — Z8673 Personal history of transient ischemic attack (TIA), and cerebral infarction without residual deficits: Secondary | ICD-10-CM | POA: Diagnosis not present

## 2016-09-10 DIAGNOSIS — Z940 Kidney transplant status: Secondary | ICD-10-CM | POA: Diagnosis not present

## 2016-09-10 DIAGNOSIS — Z7952 Long term (current) use of systemic steroids: Secondary | ICD-10-CM | POA: Diagnosis not present

## 2016-09-10 DIAGNOSIS — Z7902 Long term (current) use of antithrombotics/antiplatelets: Secondary | ICD-10-CM | POA: Diagnosis not present

## 2016-09-10 DIAGNOSIS — Z794 Long term (current) use of insulin: Secondary | ICD-10-CM | POA: Diagnosis not present

## 2016-09-10 DIAGNOSIS — N186 End stage renal disease: Secondary | ICD-10-CM | POA: Diagnosis not present

## 2016-09-10 DIAGNOSIS — E1140 Type 2 diabetes mellitus with diabetic neuropathy, unspecified: Secondary | ICD-10-CM | POA: Diagnosis not present

## 2016-09-10 DIAGNOSIS — D8989 Other specified disorders involving the immune mechanism, not elsewhere classified: Secondary | ICD-10-CM | POA: Diagnosis not present

## 2016-09-10 DIAGNOSIS — N2581 Secondary hyperparathyroidism of renal origin: Secondary | ICD-10-CM | POA: Diagnosis not present

## 2016-09-10 DIAGNOSIS — I2510 Atherosclerotic heart disease of native coronary artery without angina pectoris: Secondary | ICD-10-CM | POA: Diagnosis not present

## 2016-09-10 DIAGNOSIS — E785 Hyperlipidemia, unspecified: Secondary | ICD-10-CM | POA: Diagnosis not present

## 2016-09-10 DIAGNOSIS — I10 Essential (primary) hypertension: Secondary | ICD-10-CM | POA: Diagnosis not present

## 2016-09-10 DIAGNOSIS — Z7982 Long term (current) use of aspirin: Secondary | ICD-10-CM | POA: Diagnosis not present

## 2016-09-18 ENCOUNTER — Ambulatory Visit (INDEPENDENT_AMBULATORY_CARE_PROVIDER_SITE_OTHER): Payer: Medicare Other | Admitting: Family Medicine

## 2016-09-18 ENCOUNTER — Encounter: Payer: Self-pay | Admitting: Family Medicine

## 2016-09-18 DIAGNOSIS — E0843 Diabetes mellitus due to underlying condition with diabetic autonomic (poly)neuropathy: Secondary | ICD-10-CM | POA: Diagnosis not present

## 2016-09-18 DIAGNOSIS — Z794 Long term (current) use of insulin: Secondary | ICD-10-CM | POA: Diagnosis not present

## 2016-09-18 DIAGNOSIS — E1142 Type 2 diabetes mellitus with diabetic polyneuropathy: Secondary | ICD-10-CM | POA: Diagnosis not present

## 2016-09-18 NOTE — Assessment & Plan Note (Addendum)
Diabetic neuropathy S: compliant with lantus 25 units BID and short acting 16units BID. 07/16/16 a1c was 10.6. Fasting sugars 120s to 150s.. 2 hours after meals 250s after dinner can be over 300 or 400. Also with neuropathic pain- does not tolerate narcotics he says and was told [facility] option [facility] neurontin and he is on 600mg  TID on his list from them.  A/P: very poor control with last a1c over 10. We decided to increase short acting insulin to 17 units before breakfast and 18 units before dinner. He would like to transfer to local endocrinology and referred. Also discussed wish there was another good option for neuropathy but with recommendations [facility] that neurontin only option- should continue. I also wonder if this is radicular.   Advised on diet- meals seem carb heavy and advised him to adjust this

## 2016-09-18 NOTE — Progress Notes (Signed)
Pre visit review using our clinic review tool, if applicable. No additional management support is needed unless otherwise documented below in the visit note. 

## 2016-09-18 NOTE — Patient Instructions (Signed)
We decided to increase short acting insulin to 17 units before breakfast and 18 units before dinner.   Refer to local endocrinology for you. Should hear within a few weeks about this- call us if havent heard in 3 weeks.

## 2016-09-18 NOTE — Progress Notes (Signed)
Subjective:  [name] is a 66 y.o. year old very pleasant male patient who presents for/with See problem oriented charting ROS-  Does have some edema and bilateral foot/leg pain.  No fever or chills. No hypoglycemia.   Past Medical History-  Patient Active Problem List   Diagnosis Date Noted  . 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 Renal transplant recipient 06/27/2015    Priority: High  . Ascending aorta dilatation-4.7 cm per [facility] 2014 10/23/2014    Priority: High  . Diabetic neuropathy ([place]) 10/14/2010    Priority: High  . CAD -s/p DES to marginal vessel at [facility] 04/09/2009    Priority: High  . Diabetes mellitus due to underlying condition with diabetic autonomic (poly)neuropathy ([place]) 09/05/2007    Priority: High  . Gout 12/22/2009    Priority: Medium  . Obstructive sleep apnea 07/10/2009    Priority: Medium  . BPH (benign prostatic hyperplasia) 09/25/2008    Priority: Medium  . Low back pain 12/15/2007    Priority: Medium  . Posttraumatic stress disorder 11/03/2007    Priority: Medium  . Essential hypertension 01/17/2007    Priority: Medium  . CVA (cerebral infarction) 01/17/2007    Priority: Medium  . GERD (gastroesophageal reflux disease) 05/16/2014    Priority: Low  . Trigger thumb of right hand 01/03/2014    Priority: Low  . Left foot drop 05/02/2013    Priority: Low  . Risk for falls 01/22/2012    Priority: Low  . Gastroparesis 06/19/2008    Priority: Low  . Allergic rhinitis 03/01/2007    Priority: Low  . Hyperlipidemia 01/16/2016  . Orthostatic hypotension 01/15/2015  . Fall at home 01/15/2015    Medications- reviewed and updated Current Outpatient Prescriptions  Medication Sig Dispense Refill  . ACCU-CHEK AVIVA PLUS test strip USE AS DIRECTED CHECK BLOOD SUGAR TWICE A DAY 200 each 3  . ACCU-CHEK SOFTCLIX LANCETS lancets Use three times daily 200 each 3  . allopurinol  (ZYLOPRIM) 100 MG tablet Take 100 mg by mouth 2 (two) times daily.     [name] amLODipine (NORVASC) 10 MG tablet Take 10 mg by mouth daily.    [name] aspirin 81 MG tablet Take 81 mg by mouth at bedtime.     [name] atorvastatin (LIPITOR) 80 MG tablet Take 40 mg by mouth daily.    . clopidogrel (PLAVIX) 75 MG tablet Take 75 mg by mouth daily.     . diazepam (VALIUM) 5 MG tablet Take 1 tablet (5 mg total) by mouth at bedtime. 30 tablet 0  . furosemide (LASIX) 40 MG tablet Take 40 mg by mouth 2 (two) times daily. 80mg  in the AM, 40 mg in the PM    . gabapentin (NEURONTIN) 100 MG capsule Take 1 capsule (100 mg total) by mouth daily. (Patient taking differently: Take 300 mg by mouth 3 (three) times daily. 300mg  in the AM, 300mg  mid day, and 300mg  PM) 30 capsule 3  . insulin glargine (LANTUS) 100 UNIT/ML injection Inject 25 Units into the skin 2 (two) times daily.     . Insulin Lispro (HUMALOG [place]) Inject 7 Units/day into the skin 3 (three) times daily before meals.    . Magnesium 200 MG TABS Take 200 mg by mouth daily. 2 tablets after lunch    . multivitamin (RENA-VIT) TABS tablet Take 1 tablet by mouth every morning.     . mycophenolate (MYFORTIC) 180 MG EC tablet Take 180  mg by mouth 2 (two) times daily.    [name] omeprazole (PRILOSEC) 20 MG capsule Take 20 mg by mouth daily.    . solifenacin (VESICARE) 5 MG tablet Take 5 mg by mouth every morning.     . Tacrolimus 1 MG CP24 Take 3 mg by mouth 2 (two) times daily.    . traMADol (ULTRAM) 50 MG tablet Take 1 tablet (50 mg total) by mouth every 8 (eight) hours as needed. For pain. Take everyday per patient (Patient taking differently: Take 50 mg by mouth every 8 (eight) hours as needed (takes with amoxicillin). For pain. Take everyday per patient) 30 tablet 1  . acetaminophen (TYLENOL) 500 MG tablet Take 1,000 mg by mouth as needed.     No current facility-administered medications for this visit.     Objective: BP 104/72 (BP Location: Left Arm, Patient Position: Sitting,  Cuff Size: Large)   Pulse 83   Temp 97.9 F (36.6 C) (Oral)   Ht 5\' 10"  (1.778 m)   Wt 247 lb 9.6 oz (112.3 kg)   SpO2 95%   BMI 35.53 kg/m  Gen: NAD, resting comfortably CV: RRR no murmurs rubs or gallops Lungs: CTAB no crackles, wheeze, rhonchi Abdomen: obese Ext: 1+ edema Skin: warm, dry  Assessment/Plan:  Diabetes mellitus due to underlying condition with diabetic autonomic (poly)neuropathy ([facility]) Diabetic neuropathy S: compliant with lantus 25 units BID and short acting 16units BID. 07/16/16 a1c was 10.6. Fasting sugars 120s to 150s.. 2 hours after meals 250s after dinner can be over 300 or 400. Also with neuropathic pain- does not tolerate narcotics he says and was told only option [facility] was neurontin and he is on 600mg  TID on his list from them.  A/P: very poor control with last a1c over 10. We decided to increase short acting insulin to 17 units before breakfast and 18 units before dinner. He would like to transfer to local endocrinology and referred. Also discussed wish there was another good option for neuropathy but with recommendations [facility] that neurontin only option- should continue. I also wonder if this is radicular.   Advised on diet- meals seem carb heavy and advised him to adjust this  Meds ordered this encounter  Medications  . acetaminophen (TYLENOL) 500 MG tablet    Sig: Take 1,000 mg by mouth as needed.  [name] amLODipine (NORVASC) 10 MG tablet    Sig: Take 10 mg by mouth daily.    Return precautions advised.  Garret Reddish, MD

## 2016-10-07 ENCOUNTER — Telehealth: Payer: Self-pay | Admitting: Family Medicine

## 2016-10-07 NOTE — Telephone Encounter (Signed)
Pt is calling back to follow-up on referrals.  Very anxious regarding the podiatry referral.

## 2016-10-07 NOTE — Telephone Encounter (Signed)
Pt would like to have a referral to a ophthalmologist and a podiatrist.

## 2016-10-12 ENCOUNTER — Other Ambulatory Visit: Payer: Self-pay

## 2016-10-12 DIAGNOSIS — Z0100 Encounter for examination of eyes and vision without abnormal findings: Principal | ICD-10-CM

## 2016-10-12 DIAGNOSIS — E119 Type 2 diabetes mellitus without complications: Secondary | ICD-10-CM

## 2016-10-12 NOTE — Telephone Encounter (Signed)
Referrals placed 

## 2016-10-13 DIAGNOSIS — H524 Presbyopia: Secondary | ICD-10-CM | POA: Diagnosis not present

## 2016-10-13 DIAGNOSIS — E113293 Type 2 diabetes mellitus with mild nonproliferative diabetic retinopathy without macular edema, bilateral: Secondary | ICD-10-CM | POA: Diagnosis not present

## 2016-10-13 LAB — HM DIABETES EYE EXAM

## 2016-10-16 ENCOUNTER — Encounter: Payer: Self-pay | Admitting: Family Medicine

## 2016-10-21 ENCOUNTER — Ambulatory Visit: Payer: Self-pay | Admitting: Podiatry

## 2016-10-22 ENCOUNTER — Encounter: Payer: Self-pay | Admitting: Podiatry

## 2016-10-22 ENCOUNTER — Ambulatory Visit (INDEPENDENT_AMBULATORY_CARE_PROVIDER_SITE_OTHER): Payer: Medicare Other | Admitting: Podiatry

## 2016-10-22 VITALS — BP 152/85 | HR 90

## 2016-10-22 DIAGNOSIS — M109 Gout, unspecified: Secondary | ICD-10-CM | POA: Diagnosis not present

## 2016-10-22 DIAGNOSIS — E785 Hyperlipidemia, unspecified: Secondary | ICD-10-CM | POA: Diagnosis not present

## 2016-10-22 DIAGNOSIS — Z955 Presence of coronary angioplasty implant and graft: Secondary | ICD-10-CM | POA: Diagnosis not present

## 2016-10-22 DIAGNOSIS — D72819 Decreased white blood cell count, unspecified: Secondary | ICD-10-CM | POA: Diagnosis not present

## 2016-10-22 DIAGNOSIS — Z8673 Personal history of transient ischemic attack (TIA), and cerebral infarction without residual deficits: Secondary | ICD-10-CM | POA: Diagnosis not present

## 2016-10-22 DIAGNOSIS — Z940 Kidney transplant status: Secondary | ICD-10-CM | POA: Diagnosis not present

## 2016-10-22 DIAGNOSIS — I120 Hypertensive chronic kidney disease with stage 5 chronic kidney disease or end stage renal disease: Secondary | ICD-10-CM | POA: Diagnosis not present

## 2016-10-22 DIAGNOSIS — I2510 Atherosclerotic heart disease of native coronary artery without angina pectoris: Secondary | ICD-10-CM | POA: Diagnosis not present

## 2016-10-22 DIAGNOSIS — D631 Anemia in chronic kidney disease: Secondary | ICD-10-CM | POA: Diagnosis not present

## 2016-10-22 DIAGNOSIS — M79676 Pain in unspecified toe(s): Secondary | ICD-10-CM

## 2016-10-22 DIAGNOSIS — B349 Viral infection, unspecified: Secondary | ICD-10-CM | POA: Diagnosis not present

## 2016-10-22 DIAGNOSIS — E1142 Type 2 diabetes mellitus with diabetic polyneuropathy: Secondary | ICD-10-CM

## 2016-10-22 DIAGNOSIS — M205X9 Other deformities of toe(s) (acquired), unspecified foot: Secondary | ICD-10-CM

## 2016-10-22 DIAGNOSIS — B351 Tinea unguium: Secondary | ICD-10-CM | POA: Diagnosis not present

## 2016-10-22 DIAGNOSIS — Z7982 Long term (current) use of aspirin: Secondary | ICD-10-CM | POA: Diagnosis not present

## 2016-10-22 DIAGNOSIS — Z79899 Other long term (current) drug therapy: Secondary | ICD-10-CM | POA: Diagnosis not present

## 2016-10-22 DIAGNOSIS — N186 End stage renal disease: Secondary | ICD-10-CM | POA: Diagnosis not present

## 2016-10-22 DIAGNOSIS — E119 Type 2 diabetes mellitus without complications: Secondary | ICD-10-CM | POA: Diagnosis not present

## 2016-10-22 DIAGNOSIS — G629 Polyneuropathy, unspecified: Secondary | ICD-10-CM | POA: Diagnosis not present

## 2016-10-22 DIAGNOSIS — I10 Essential (primary) hypertension: Secondary | ICD-10-CM | POA: Diagnosis not present

## 2016-10-22 DIAGNOSIS — G4733 Obstructive sleep apnea (adult) (pediatric): Secondary | ICD-10-CM | POA: Diagnosis not present

## 2016-10-22 DIAGNOSIS — D8989 Other specified disorders involving the immune mechanism, not elsewhere classified: Secondary | ICD-10-CM | POA: Diagnosis not present

## 2016-10-22 DIAGNOSIS — E1165 Type 2 diabetes mellitus with hyperglycemia: Secondary | ICD-10-CM | POA: Diagnosis not present

## 2016-10-22 DIAGNOSIS — Z4822 Encounter for aftercare following kidney transplant: Secondary | ICD-10-CM | POA: Diagnosis not present

## 2016-10-22 DIAGNOSIS — E1159 Type 2 diabetes mellitus with other circulatory complications: Secondary | ICD-10-CM

## 2016-10-22 DIAGNOSIS — Z794 Long term (current) use of insulin: Secondary | ICD-10-CM | POA: Diagnosis not present

## 2016-10-22 DIAGNOSIS — E1122 Type 2 diabetes mellitus with diabetic chronic kidney disease: Secondary | ICD-10-CM | POA: Diagnosis not present

## 2016-10-22 DIAGNOSIS — E1140 Type 2 diabetes mellitus with diabetic neuropathy, unspecified: Secondary | ICD-10-CM | POA: Diagnosis not present

## 2016-10-22 NOTE — Progress Notes (Addendum)
   Subjective:    Patient ID: [name], male    DOB: 10/31/1950, 66 y.o.   MRN: [id number]  HPI this patient presents to the office with chief complaint of long thick nails. Patient states that the nails are thick and long are painful walking and wearing his shoes. He is unable to self treat. This patient is diabetic on insulin and taking gabapentin.  He presents the office today for an evaluation of his diabetic feet and preventative foot care services    Review of Systems  Constitutional: Positive for unexpected weight change.  Cardiovascular: Positive for palpitations and leg swelling.       Calf pain with walking  Endocrine:       Excessive thirst with walking,increase urination  Neurological: Positive for weakness and numbness.       Objective:   Physical Exam GENERAL APPEARANCE: Alert, conversant. Appropriately groomed. No acute distress.  VASCULAR: Pedal pulses are  palpable at  [facility] and PT right foot.  DP and PT are barely palpable left foot..  Capillary refill time is immediate to all digits,  Normal temperature gradient.   NEUROLOGIC: sensation is absent to 5.07 monofilament at 5/5 sites bilateral.  Light touch is intact bilateral, Muscle strength normal.  MUSCULOSKELETAL: acceptable muscle strength, tone and stability bilateral.  Intrinsic muscluature intact bilateral. Hallux limitus 1st MPJ  B/L. NAILS  thick disfigured discolored nails with subungual debris noted bilateral. No evidence of any drainage or bacterial infection noted DERMATOLOGIC: skin color, texture, and turgor are within normal limits.  No preulcerative lesions or ulcers  are seen, no interdigital maceration noted.  No open lesions present.   No drainage noted.         Assessment & Plan:  Onychomycosis  B/L  Diabetes with neuropathy and angiopathy.  IE  Debride nails.  Diabetic. Examination of his feet do reveal diabetic peripheral neuropathy with hallux limitus bilaterally. Therefore, he was brought  to react to obtain a pair of diabetic shoes. RTC 3 months for nail care.   Gardiner Barefoot DPM

## 2016-10-28 ENCOUNTER — Encounter: Payer: Self-pay | Admitting: Family Medicine

## 2016-11-17 ENCOUNTER — Ambulatory Visit: Payer: Medicare Other | Admitting: Endocrinology

## 2016-11-19 ENCOUNTER — Encounter: Payer: Self-pay | Admitting: Endocrinology

## 2016-11-19 ENCOUNTER — Ambulatory Visit (INDEPENDENT_AMBULATORY_CARE_PROVIDER_SITE_OTHER): Payer: Medicare Other | Admitting: Endocrinology

## 2016-11-19 VITALS — BP 122/72 | HR 83 | Ht 70.0 in | Wt 256.0 lb

## 2016-11-19 DIAGNOSIS — Z794 Long term (current) use of insulin: Secondary | ICD-10-CM | POA: Diagnosis not present

## 2016-11-19 DIAGNOSIS — E0843 Diabetes mellitus due to underlying condition with diabetic autonomic (poly)neuropathy: Secondary | ICD-10-CM

## 2016-11-19 LAB — POCT GLYCOSYLATED HEMOGLOBIN (HGB A1C): Hemoglobin A1C: 11.3

## 2016-11-19 MED ORDER — INSULIN GLARGINE 100 UNIT/ML ~~LOC~~ SOLN: 20.0000 [IU] | Freq: Two times a day (BID) | SUBCUTANEOUS | 3 refills | Status: DC

## 2016-11-19 MED ORDER — INSULIN LISPRO 100 UNIT/ML ~~LOC~~ SOLN: 25.0000 [IU] | Freq: Three times a day (TID) | SUBCUTANEOUS | 3 refills | Status: DC

## 2016-11-19 NOTE — Progress Notes (Signed)
Subjective:    Patient ID: [name], male    DOB: [birth year], 66 y.o.   MRN: [id number]  HPI pt is referred by Dr Yong Channel, for diabetes.  Pt states DM was dx'ed in 1993; he has moderate neuropathy of the lower extremities; he has associated CVA, CAD, renal failure, and gastroparesis; he has been on insulin since soon after dx; pt says his diet is good, but exercise is limited by health problems; he has never had pancreatitis, pancreatic surgery, or DKA.  He has had only 1 episode of severe hypoglycemia (approx 1998).  He takes multiple daily injections (lantus 25-BID, and humalog 17 units 3 times a day (just before each meal)).  He says cbg's vary from 120-300.  It is in general higher as the day goes on.   Past Medical History:  Diagnosis Date  . Allergy   . Angina    LAST HAD CP 2 MO AGO  DR WALL [address]  . Arthritis   . Backache, unspecified   . Chest pain, unspecified   . CHF (congestive heart failure) ([place])   . Chills   . Chronic kidney disease, stage III (moderate)    HD T- TH-SAT  . Complication of anesthesia    01/2011 could not eat,, hospt x2, was placed on hd and cleared up  . Coronary atherosclerosis of native coronary artery   . Diabetes mellitus 2004  . Dizziness   . Dysphagia, unspecified(787.20)   . Gastroparesis   . Headache(784.0)   . Hemorrhage of rectum and anus   . Hyperlipidemia   . Hypertrophy of prostate with urinary obstruction and other lower urinary tract symptoms (LUTS)   . INTERNAL HEMORRHOIDS 10/16/2008   Qualifier: Diagnosis of  By: Nolon Rod CMA (AAMA), Robin    . Internal hemorrhoids without mention of complication   . Myocardial infarction ([place]) 2002  . Nausea alone   . Obesity   . Other dyspnea and respiratory abnormality   . Other malaise and fatigue   . Personal history of unspecified circulatory disease   . Posttraumatic stress disorder   . Sleep apnea    USES CPAP   . Stroke [facility])    2007  . Unspecified essential hypertension    hx htn     Past Surgical History:  Procedure Laterality Date  . ANAL FISSURECTOMY    . AV FISTULA PLACEMENT    . CARDIAC CATHETERIZATION     2005 DR BRODIE  . HEMORRHOID SURGERY    . revision of fistula     renal failure  . VENTRAL HERNIA REPAIR  07/01/2011   Procedure: HERNIA REPAIR VENTRAL ADULT;  Surgeon: Joyice Faster. Cornett, MD;  Location: [facility] OR;  Service: General;  Laterality: N/A;    Social History   Social History  . Marital status: Married    Spouse name: N/A  . Number of children: N/A  . Years of education: N/A   Occupational History  . disabled Disabled   Social History Main Topics  . Smoking status: Never Smoker  . Smokeless tobacco: Never Used  . Alcohol use No  . Drug use: No  . Sexual activity: Not Currently   Other Topics Concern  . Not on file   Social History Narrative   Married 9565. 63 year old son in 33. 1 granddaughter from [place].       Retired from [employer]. 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 ACCU-CHEK AVIVA PLUS test strip USE AS DIRECTED CHECK BLOOD SUGAR TWICE A DAY 200 each 3  . ACCU-CHEK SOFTCLIX LANCETS lancets Use three times daily 200 each 3  . acetaminophen (TYLENOL) 500 MG tablet Take 1,000 mg by mouth as needed.    [name] allopurinol (ZYLOPRIM) 100 MG tablet Take 100 mg by mouth 2 (two) times daily.     [name] amLODipine (NORVASC) 10 MG tablet Take 10 mg by mouth daily.    [name] aspirin 81 MG tablet Take 81 mg by mouth at bedtime.     [name] atorvastatin (LIPITOR) 80 MG tablet Take 40 mg by mouth daily.    . clopidogrel (PLAVIX) 75 MG tablet Take 75 mg by mouth daily.     . diazepam (VALIUM) 5 MG tablet Take 1 tablet (5 mg total) by mouth at bedtime. 30 tablet 0  . furosemide (LASIX) 40 MG tablet 2 tabs two times per day.    . gabapentin  (NEURONTIN) 100 MG capsule Take 1 capsule (100 mg total) by mouth daily. (Patient taking differently: 2 in the am 2 in the pm and 2 if needed) 30 capsule 3  . Magnesium 200 MG TABS Take 200 mg by mouth daily. 2 tablets after lunch    . mycophenolate (MYFORTIC) 180 MG EC tablet Take 180 mg by mouth 2 (two) times daily.    [name] omeprazole (PRILOSEC) 20 MG capsule Take 20 mg by mouth daily.    . solifenacin (VESICARE) 5 MG tablet Take 5 mg by mouth every morning.     . Tacrolimus 1 MG CP24 Take 3 mg by mouth 2 (two) times daily.    . traMADol (ULTRAM) 50 MG tablet Take 1 tablet (50 mg total) by mouth every 8 (eight) hours as needed. For pain. Take everyday per patient (Patient taking differently: Take 50 mg by mouth every 8 (eight) hours as needed (takes with amoxicillin). For pain. Take everyday per patient) 30 tablet 1   No current facility-administered medications on file prior to visit.     No Known Allergies  Family History  Problem Relation Age of Onset  . Heart disease Father   . Renal Disease Father   . Dementia Mother   . Diabetes Sister   . Heart disease Sister   . Diabetes Maternal Aunt   . Diabetes Maternal Uncle   . Diabetes Paternal Aunt   . Diabetes Paternal Uncle   . Heart disease Unknown   . Heart disease Brother     BP 122/72   Pulse 83   Ht 5\' 10"  (1.778 m)   Wt 256 lb (116.1 kg)   SpO2 95%   BMI 36.73 kg/m    Review of Systems denies weight loss, blurry vision, headache, chest pain, n/v, excessive diaphoresis, memory loss, cold intolerance, and rhinorrhea.  He has doe, easy bruising, and leg cramps.  He has frequent urination due to lasix.       Objective:   Physical Exam VS: see vs page GEN: no distress HEAD: head: no deformity eyes: no periorbital swelling, no proptosis external nose and ears are normal mouth: no lesion seen NECK: supple, thyroid is not enlarged CHEST WALL: no deformity LUNGS: clear to auscultation CV: reg rate and rhythm, no  murmur ABD: abdomen is soft, nontender.  no hepatosplenomegaly.  not distended.  no hernia MUSCULOSKELETAL: muscle bulk  and strength are grossly normal.  no obvious joint swelling.  gait is slow but steady.   EXTEMITIES: no deformity.  no ulcer on the feet.  feet are of normal color and temp.  1+ bilat leg edema.   PULSES: dorsalis pedis intact bilat.  no carotid bruit.   NEURO:  cn 2-12 grossly intact.   readily moves all 4's.  sensation is intact to touch on the feet, but decreased from normal.   SKIN:  Normal texture and temperature.  No rash or suspicious lesion is visible.   NODES:  None palpable at the neck.   PSYCH: alert, well-oriented.  Does not appear anxious nor depressed.     Lab Results  Component Value Date   HGBA1C 11.3 11/19/2016   I personally reviewed electrocardiogram tracing (06/06/15): Indication: chest pain Impression: NSR.  No MI.  No hypertrophy.  Low voltage Compared to 2016: no significant change  I have reviewed outside records, and summarized: Pt was noted to have severely elevated a1c, and referred here.  Mealtime insulin was chosen as the insulin to increase, due to renal failure     Assessment & Plan:  Insulin-requiring type 2 DM, with CAD: severe exacerbation. Renal failure: this is the likely reason why the pattern of cbg's says the humalog is the insulin to increase   Patient Instructions  good diet and exercise significantly improve the control of your diabetes.  please let me know if you wish to be referred to a dietician.  high blood sugar is very risky to your health.  you should see an eye doctor and dentist every year.  It is very important to get all recommended vaccinations.  Controlling your blood pressure and cholesterol drastically reduces the damage diabetes does to your body.  Those who smoke should quit.  Please discuss these with your doctor.  check your blood sugar twice a day.  vary the time of day when you check, between before the 3  meals, and at bedtime.  also check if you have symptoms of your blood sugar being too high or too low.  please keep a record of the readings and bring it to your next appointment here (or you can bring the meter itself).  You can write it on any piece of paper.  please call us sooner if your blood sugar goes below 70, or if you have a lot of readings over 200. For now, please: Increase humalog to 25 units, 3 times a day (just before each meal), and:  Reduce lantus to 20 units twice a day, and:  Take  Please call us next week, to tell us how the blood sugar is doing. Please come back for a follow-up appointment in 3 months.

## 2016-11-19 NOTE — Patient Instructions (Addendum)
good diet and exercise significantly improve the control of your diabetes.  please let me know if you wish to be referred to a dietician.  high blood sugar is very risky to your health.  you should see an eye doctor and dentist every year.  It is very important to get all recommended vaccinations.  Controlling your blood pressure and cholesterol drastically reduces the damage diabetes does to your body.  Those who smoke should quit.  Please discuss these with your doctor.  check your blood sugar twice a day.  vary the time of day when you check, between before the 3 meals, and at bedtime.  also check if you have symptoms of your blood sugar being too high or too low.  please keep a record of the readings and bring it to your next appointment here (or you can bring the meter itself).  You can write it on any piece of paper.  please call us sooner if your blood sugar goes below 70, or if you have a lot of readings over 200. For now, please: Increase humalog to 25 units, 3 times a day (just before each meal), and:  Reduce lantus to 20 units twice a day, and:  Take  Please call us next week, to tell us how the blood sugar is doing. Please come back for a follow-up appointment in 3 months.

## 2016-11-24 ENCOUNTER — Encounter: Payer: Self-pay | Admitting: Family Medicine

## 2016-11-24 ENCOUNTER — Ambulatory Visit (INDEPENDENT_AMBULATORY_CARE_PROVIDER_SITE_OTHER): Payer: Medicare Other | Admitting: Family Medicine

## 2016-11-24 DIAGNOSIS — E1142 Type 2 diabetes mellitus with diabetic polyneuropathy: Secondary | ICD-10-CM | POA: Diagnosis not present

## 2016-11-24 NOTE — Progress Notes (Signed)
Subjective:  [name] is a 66 y.o. year old very pleasant male patient who presents for/with See problem oriented charting ROS- no fever, chills, nausea. Having intense pain in left leg at times that makes him feel like he will urinate on himself but does not.    Past Medical History-  Patient Active Problem List   Diagnosis Date Noted  . 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 Renal transplant recipient 06/27/2015    Priority: High  . 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 Diabetic neuropathy ([place]) 10/14/2010    Priority: High  . CAD -s/p DES to marginal vessel at [facility] 04/09/2009    Priority: High  . Diabetes mellitus due to underlying condition with diabetic autonomic (poly)neuropathy ([place]) 09/05/2007    Priority: High  . Gout 12/22/2009    Priority: Medium  . Obstructive sleep apnea 07/10/2009    Priority: Medium  . BPH (benign prostatic hyperplasia) 09/25/2008    Priority: Medium  . Low back pain 12/15/2007    Priority: Medium  . Posttraumatic stress disorder 11/03/2007    Priority: Medium  . Essential hypertension 01/17/2007    Priority: Medium  . CVA (cerebral infarction) 01/17/2007    Priority: Medium  . GERD (gastroesophageal reflux disease) 05/16/2014    Priority: Low  . Trigger thumb of right hand 01/03/2014    Priority: Low  . Left foot drop 05/02/2013    Priority: Low  . Risk for falls 01/22/2012    Priority: Low  . Gastroparesis 06/19/2008    Priority: Low  . Allergic rhinitis 03/01/2007    Priority: Low  . Hyperlipidemia 01/16/2016  . Orthostatic hypotension 01/15/2015  . Fall at home 01/15/2015    Medications- reviewed and updated Current Outpatient Prescriptions  Medication Sig Dispense Refill  . ACCU-CHEK AVIVA PLUS test strip USE AS DIRECTED CHECK BLOOD SUGAR TWICE A DAY 200 each 3  . ACCU-CHEK SOFTCLIX LANCETS lancets Use  three times daily 200 each 3  . acetaminophen (TYLENOL) 500 MG tablet Take 1,000 mg by mouth as needed.    [name] allopurinol (ZYLOPRIM) 100 MG tablet Take 100 mg by mouth 2 (two) times daily.     [name] amLODipine (NORVASC) 10 MG tablet Take 10 mg by mouth daily.    [name] aspirin 81 MG tablet Take 81 mg by mouth at bedtime.     [name] atorvastatin (LIPITOR) 80 MG tablet Take 40 mg by mouth daily.    . clopidogrel (PLAVIX) 75 MG tablet Take 75 mg by mouth daily.     . diazepam (VALIUM) 5 MG tablet Take 1 tablet (5 mg total) by mouth at bedtime. 30 tablet 0  . furosemide (LASIX) 40 MG tablet 2 tabs two times per day.    . gabapentin (NEURONTIN) 100 MG capsule Take 1 capsule (100 mg total) by mouth daily. (Patient taking differently: 2 in the am 2 in the pm and 2 if needed) 30 capsule 3  . insulin glargine (LANTUS) 100 UNIT/ML injection Inject 0.2 mLs (20 Units total) into the skin 2 (two) times daily. And syringes 5/day 50 mL 3  . insulin lispro (HUMALOG) 100 UNIT/ML injection Inject 0.25 mLs (25 Units total) into the skin 3 (three) times daily before meals. 80 mL 3  . Magnesium 200 MG TABS Take 200 mg by mouth daily. 2 tablets after lunch    . mycophenolate (MYFORTIC) 180 MG EC  tablet Take 180 mg by mouth 2 (two) times daily.    [name] omeprazole (PRILOSEC) 20 MG capsule Take 20 mg by mouth daily.    . solifenacin (VESICARE) 5 MG tablet Take 5 mg by mouth every morning.     . Tacrolimus 1 MG CP24 Take 4 mg by mouth 2 (two) times daily.     . traMADol (ULTRAM) 50 MG tablet Take 1 tablet (50 mg total) by mouth every 8 (eight) hours as needed. For pain. Take everyday per patient (Patient taking differently: Take 50 mg by mouth every 8 (eight) hours as needed (takes with amoxicillin). For pain. Take everyday per patient) 30 tablet 1   No current facility-administered medications for this visit.     Objective: BP 124/76 (BP Location: Left Arm, Patient Position: Sitting, Cuff Size: Large)   Pulse 89   Temp 99.3 F (37.4  C) (Oral)   Ht 5\' 10"  (1.778 m)   Wt 256 lb 6.4 oz (116.3 kg)   SpO2 97%   BMI 36.79 kg/m  Gen: NAD, resting comfortably CV: RRR no murmurs rubs or gallops Lungs: CTAB no crackles, wheeze, rhonchi Abdomen: obese Ext: no edema Skin: warm, dry MSK: lateral to left tibia patient has pain in a band from near knee to near ankle. Very tender.   Assessment/Plan:  Diabetic neuropathy ([place]) S: pain in left leg - medial to left shin. States pain can be severe and even make him cry. When he takes his insulin- symptoms improve. Gabapentin helps as well but only short term. Takes gabapentin 200mg  in morning and evening- occasionally takes up to an additoinal 200mg  each day. Tramadol takes this but doesn't help much. Has not tried icing it.  A/P: Patient has had long term issues with lower extremity pain which is difficult to tease out whether it is radicular or due to his diabetic neuropathy. Not a great surgical candidate. Injections would drive blood sugar up as would prednisone. Tramadol not helping. On gabapentin - and apparently has been told not to increase dose past 200mg  TID. Discussed icing or heating options- options really limited. Patient frustrated by this- we discussed how narcotics were another option but not ideal and both opted out of anything past tramadol did discuss option of following up with orthopedics [facility] which he declines for now  The duration of face-to-face time during this visit was greater than 15 minutes. Greater than 50% of this time was spent in counseling, explanation of diagnosis, planning of further management, and/or coordination of care including discussion of difficulty of dealing with neuropathic pain, lmiited options and discussion of prior and potential trials.     Return precautions advised.  Garret Reddish, MD

## 2016-11-24 NOTE — Patient Instructions (Signed)
Lets try icing 20 minutes 3x a day. If that doesn't help try heat.   I wish we had a better solution for your neuropathy and back pain/sciatica issues

## 2016-11-24 NOTE — Assessment & Plan Note (Signed)
S: pain in left leg - medial to left shin. States pain can be severe and even make him cry. When he takes his insulin- symptoms improve. Gabapentin helps as well but only short term. Takes gabapentin 200mg  in morning and evening- occasionally takes up to an additoinal 200mg  each day. Tramadol takes this but doesn't help much. Has not tried icing it.  A/P: Patient has had long term issues with lower extremity pain which is difficult to tease out whether it is radicular or due to his diabetic neuropathy. Not a great surgical candidate. Injections would drive blood sugar up as would prednisone. Tramadol not helping. On gabapentin - and apparently has been told not to increase dose past 200mg  TID. Discussed icing or heating options- options really limited. Patient frustrated by this- we discussed how narcotics were another option but not ideal and both opted out of anything past tramadol

## 2016-11-26 ENCOUNTER — Ambulatory Visit (INDEPENDENT_AMBULATORY_CARE_PROVIDER_SITE_OTHER): Payer: Medicare Other | Admitting: Podiatry

## 2016-11-26 DIAGNOSIS — E1142 Type 2 diabetes mellitus with diabetic polyneuropathy: Secondary | ICD-10-CM | POA: Diagnosis not present

## 2016-11-26 DIAGNOSIS — E1159 Type 2 diabetes mellitus with other circulatory complications: Secondary | ICD-10-CM

## 2016-11-26 DIAGNOSIS — M205X9 Other deformities of toe(s) (acquired), unspecified foot: Secondary | ICD-10-CM

## 2016-11-26 NOTE — Progress Notes (Signed)
Patient came in today to pick up diabetic shoes and custom inserts.  Same was well pleased with fit and function.   The patient could ambulate without any discomfort; there were no signs of any quality issues. The foot ortheses offered full contact with plantar surface and contoured the arch well.   The shoes fit well with no heel slippage and areas of pressure concern.   Patient advised to contact us if any problems arise.  Patient also advised on how to report any issues.  Patient presents to pick up diabetic shoes.  Diabetic neuropathy  Hallux limitus     Dispense diabetic shoes.  Patient presents today and was dispensed 0ne pair ( two units) of medically necessary extra depth shoes with three pair( six units) of custom molded multiple density inserts. The shoes and the inserts are fitted to the patients ' feet and are noted to fit well and are free of defect.  Length and width of the shoes are also acceptable.  Patient was given written and verbal  instructions for wearing.  If any concerns arrive with the shoes or inserts, the patient is to call the office.Patient is to follow up with doctor in six weeks.   Gardiner Barefoot DPM

## 2016-12-17 DIAGNOSIS — Z940 Kidney transplant status: Secondary | ICD-10-CM | POA: Diagnosis not present

## 2016-12-17 DIAGNOSIS — Z79899 Other long term (current) drug therapy: Secondary | ICD-10-CM | POA: Diagnosis not present

## 2017-01-19 ENCOUNTER — Emergency Department (HOSPITAL_COMMUNITY): Payer: Non-veteran care

## 2017-01-19 ENCOUNTER — Telehealth: Payer: Self-pay

## 2017-01-19 ENCOUNTER — Encounter (HOSPITAL_COMMUNITY): Payer: Self-pay

## 2017-01-19 ENCOUNTER — Emergency Department (HOSPITAL_COMMUNITY)
Admission: EM | Admit: 2017-01-19 | Discharge: 2017-01-19 | Disposition: A | Discharge status: Home / Self Care | Payer: Non-veteran care | Attending: Emergency Medicine | Admitting: Emergency Medicine

## 2017-01-19 DIAGNOSIS — S279XXA Injury of unspecified intrathoracic organ, initial encounter: Secondary | ICD-10-CM | POA: Diagnosis not present

## 2017-01-19 DIAGNOSIS — Y929 Unspecified place or not applicable: Secondary | ICD-10-CM | POA: Diagnosis not present

## 2017-01-19 DIAGNOSIS — Z7982 Long term (current) use of aspirin: Secondary | ICD-10-CM | POA: Insufficient documentation

## 2017-01-19 DIAGNOSIS — R739 Hyperglycemia, unspecified: Secondary | ICD-10-CM | POA: Diagnosis not present

## 2017-01-19 DIAGNOSIS — Z794 Long term (current) use of insulin: Secondary | ICD-10-CM | POA: Diagnosis not present

## 2017-01-19 DIAGNOSIS — E1143 Type 2 diabetes mellitus with diabetic autonomic (poly)neuropathy: Secondary | ICD-10-CM | POA: Insufficient documentation

## 2017-01-19 DIAGNOSIS — Z7902 Long term (current) use of antithrombotics/antiplatelets: Secondary | ICD-10-CM | POA: Insufficient documentation

## 2017-01-19 DIAGNOSIS — Y9389 Activity, other specified: Secondary | ICD-10-CM | POA: Diagnosis not present

## 2017-01-19 DIAGNOSIS — Y9289 Other specified places as the place of occurrence of the external cause: Secondary | ICD-10-CM | POA: Diagnosis not present

## 2017-01-19 DIAGNOSIS — N183 Chronic kidney disease, stage 3 (moderate): Secondary | ICD-10-CM | POA: Diagnosis not present

## 2017-01-19 DIAGNOSIS — I509 Heart failure, unspecified: Secondary | ICD-10-CM | POA: Insufficient documentation

## 2017-01-19 DIAGNOSIS — S4992XA Unspecified injury of left shoulder and upper arm, initial encounter: Secondary | ICD-10-CM | POA: Diagnosis not present

## 2017-01-19 DIAGNOSIS — I130 Hypertensive heart and chronic kidney disease with heart failure and stage 1 through stage 4 chronic kidney disease, or unspecified chronic kidney disease: Secondary | ICD-10-CM | POA: Diagnosis not present

## 2017-01-19 DIAGNOSIS — Y998 Other external cause status: Secondary | ICD-10-CM | POA: Diagnosis not present

## 2017-01-19 DIAGNOSIS — S40012A Contusion of left shoulder, initial encounter: Secondary | ICD-10-CM | POA: Diagnosis not present

## 2017-01-19 DIAGNOSIS — W19XXXA Unspecified fall, initial encounter: Secondary | ICD-10-CM

## 2017-01-19 DIAGNOSIS — W010XXA Fall on same level from slipping, tripping and stumbling without subsequent striking against object, initial encounter: Secondary | ICD-10-CM | POA: Diagnosis not present

## 2017-01-19 DIAGNOSIS — S299XXA Unspecified injury of thorax, initial encounter: Secondary | ICD-10-CM | POA: Diagnosis not present

## 2017-01-19 DIAGNOSIS — R0602 Shortness of breath: Secondary | ICD-10-CM | POA: Diagnosis not present

## 2017-01-19 DIAGNOSIS — E1165 Type 2 diabetes mellitus with hyperglycemia: Secondary | ICD-10-CM | POA: Diagnosis not present

## 2017-01-19 DIAGNOSIS — M25512 Pain in left shoulder: Secondary | ICD-10-CM | POA: Diagnosis not present

## 2017-01-19 LAB — COMPREHENSIVE METABOLIC PANEL
ALBUMIN: 3.6 g/dL (ref 3.5–5.0)
ALK PHOS: 95 U/L (ref 38–126)
ALT: 17 U/L (ref 17–63)
ANION GAP: 10 (ref 5–15)
AST: 20 U/L (ref 15–41)
BILIRUBIN TOTAL: 0.8 mg/dL (ref 0.3–1.2)
BUN: 19 mg/dL (ref 6–20)
CALCIUM: 9.5 mg/dL (ref 8.9–10.3)
CO2: 26 mmol/L (ref 22–32)
Chloride: 101 mmol/L (ref 101–111)
Creatinine, Ser: 1.68 mg/dL — ABNORMAL HIGH (ref 0.61–1.24)
GFR calc non Af Amer: 40 mL/min — ABNORMAL LOW (ref >60)
GFR, EST AFRICAN AMERICAN: 47 mL/min — AB (ref >60)
GLUCOSE: 252 mg/dL — AB (ref 65–99)
POTASSIUM: 3.9 mmol/L (ref 3.5–5.1)
SODIUM: 137 mmol/L (ref 135–145)
TOTAL PROTEIN: 6.4 g/dL — AB (ref 6.5–8.1)

## 2017-01-19 LAB — CBC
HCT: 42.3 % (ref 39.0–52.0)
HEMOGLOBIN: 13.3 g/dL (ref 13.0–17.0)
MCH: 26.2 pg (ref 26.0–34.0)
MCHC: 31.4 g/dL (ref 30.0–36.0)
MCV: 83.3 fL (ref 78.0–100.0)
Platelets: 209 10*3/uL (ref 150–400)
RBC: 5.08 MIL/uL (ref 4.22–5.81)
RDW: 14.6 % (ref 11.5–15.5)
WBC: 3.4 10*3/uL — ABNORMAL LOW (ref 4.0–10.5)

## 2017-01-19 LAB — I-STAT TROPONIN, ED: TROPONIN I, POC: 0.03 ng/mL (ref 0.00–0.08)

## 2017-01-19 LAB — BRAIN NATRIURETIC PEPTIDE: B Natriuretic Peptide: 23.9 pg/mL (ref 0.0–100.0)

## 2017-01-19 MED ORDER — ACETAMINOPHEN 500 MG PO TABS
1000.0000 mg | ORAL_TABLET | Freq: Once | ORAL | Status: AC
  Administered 2017-01-19: 1000 mg via ORAL
  Filled 2017-01-19: qty 2

## 2017-01-19 MED ORDER — SODIUM CHLORIDE 0.9 % IV SOLN
INTRAVENOUS | Status: DC
  Administered 2017-01-19: 14:00:00 via INTRAVENOUS

## 2017-01-19 NOTE — ED Triage Notes (Signed)
Pt arrived to [facility] from home [facility]. Pt had complaints of chest tightness and SOB, fell getting ready to take a shower to come to the ER. Pt reports not knowing if he hit his head during the fall but is on blood thinners. Right sided weakness from previous stroke, decreased ROM in left arm due to fistula. 324 mg asa and x3 sublingual nitro 0.4 give PTA. 20g IV in right hand saline locked. EMS reports blood sugar 269, did not take medication this am. A/O x4, respirations regular and unlabored.

## 2017-01-19 NOTE — Discharge Instructions (Signed)
It was our pleasure to provide your ER care today - we hope that you feel better.  For shoulder pain, take acetaminophen as need.   Your blood work and xrays look good, with exception that your blood sugar is a bit high (252) - continue your diabetic medication and diabetic eating plan.  Follow up with primary care doctor in the next 1-2 days for recheck.  Return to ER if worse, increased difficulty breathing, fevers, recurrent/persistent chest pain, other concern.

## 2017-01-19 NOTE — Telephone Encounter (Signed)
Patient called with c/o "fluid bubbling up in his throat". He states that it seems to be mucus and he feels like he is choking and is having difficulty breathing. He is concerned because "this feels like it did when I had my heart attack". He also reports that he had been out of Lasix for about a week and just restarted it the other day. He has gained 4lbs in the past few days and also reports some abdominal distention. He is wondering if he needs to go to the ED for evaluation, he would like to know what Dr. Yong Channel suggests.  Spoke with Dr. Yong Channel and he advised pt seek care at ED immediately.   Spoke with pt and advised. He is unable to reach his wife and has no one to take him to ED. Pt advised he will call EMS for transport.   Dr. Yong Channel - FYI. Thanks!

## 2017-01-19 NOTE — ED Notes (Signed)
Patient transported to X-ray 

## 2017-01-19 NOTE — ED Notes (Signed)
ED Provider at bedside. 

## 2017-01-19 NOTE — ED Provider Notes (Signed)
[facility] DEPT Provider Note   CSN: [id number] Arrival date & time: 01/19/17  1211     History   Chief Complaint No chief complaint on file.   HPI [name] is a 66 y.o. male.  Patient with hx renal transplant, cad/stent, chf, c/o productive cough last night, states felt as if fluids was bubbling up, orthopnea, mild sob.  This AM, states only had one shoe on, feet were uneven, and tripped, falling against left shoulder. C/o pain to shoulder, moderate, constant, worse w movement. Denies head injury or loc. No headache. No neck or back pain. Denies abd pain. No vomiting. States compliant w normal meds, making normal amount urine. States wt increased 4 lbs since prior office visit. Mild bil leg edema.    The history is provided by the patient.    Past Medical History:  Diagnosis Date  . Allergy   . Angina    LAST HAD CP 2 MO AGO  DR WALL [place]  . Arthritis   . Backache, unspecified   . Chest pain, unspecified   . CHF (congestive heart failure) ([place])   . Chills   . Chronic kidney disease, stage III (moderate)    HD T- TH-SAT  . Complication of anesthesia    01/2011 could not eat,, hospt x2, was placed on hd and cleared up  . Coronary atherosclerosis of native coronary artery   . Diabetes mellitus 2004  . Dizziness   . Dysphagia, unspecified(787.20)   . Gastroparesis   . Headache(784.0)   . Hemorrhage of rectum and anus   . Hyperlipidemia   . Hypertrophy of prostate with urinary obstruction and other lower urinary tract symptoms (LUTS)   . INTERNAL HEMORRHOIDS 10/16/2008   Qualifier: Diagnosis of  By: Nolon Rod CMA (AAMA), Robin    . Internal hemorrhoids without mention of complication   . Myocardial infarction ([place]) 2002  . Nausea alone   . Obesity   . Other dyspnea and respiratory abnormality   . Other malaise and fatigue   . Personal history of unspecified circulatory disease   . Posttraumatic stress disorder   . Sleep apnea    USES CPAP   . Stroke  [facility])    2007  . Unspecified essential hypertension    hx htn     Patient Active Problem List   Diagnosis Date Noted  . Hyperlipidemia 01/16/2016  . Immunosuppressive management encounter following kidney transplant 07/12/2015  . Renal transplant recipient 06/27/2015  . Orthostatic hypotension 01/15/2015  . Fall at home 01/15/2015  . 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 GERD (gastroesophageal reflux disease) 05/16/2014  . Trigger thumb of right hand 01/03/2014  . Left foot drop 05/02/2013  . Risk for falls 01/22/2012  . Diabetic neuropathy ([place]) 10/14/2010  . Gout 12/22/2009  . Obstructive sleep apnea 07/10/2009  . CAD -s/p DES to marginal vessel at [facility] 2014 04/09/2009  . BPH (benign prostatic hyperplasia) 09/25/2008  . Gastroparesis 06/19/2008  . Low back pain 12/15/2007  . Posttraumatic stress disorder 11/03/2007  . Diabetes mellitus due to underlying condition with diabetic autonomic (poly)neuropathy ([place]) 09/05/2007  . Allergic rhinitis 03/01/2007  . Essential hypertension 01/17/2007  . CVA (cerebral infarction) 01/17/2007    Past Surgical History:  Procedure Laterality Date  . ANAL FISSURECTOMY    . AV FISTULA PLACEMENT    . CARDIAC CATHETERIZATION     2005 DR BRODIE  . HEMORRHOID SURGERY    . revision of fistula  renal failure  . VENTRAL HERNIA REPAIR  07/01/2011   Procedure: HERNIA REPAIR VENTRAL ADULT;  Surgeon: Joyice Faster. Cornett, MD;  Location: [place];  Service: General;  Laterality: N/A;       Home Medications    Prior to Admission medications   Medication Sig Start Date End Date Taking? Authorizing Provider  ACCU-CHEK AVIVA PLUS test strip USE AS DIRECTED CHECK BLOOD SUGAR TWICE A DAY 07/20/16   [name], MD  ACCU-CHEK SOFTCLIX LANCETS lancets Use three times daily 11/16/13   Ricard Dillon, MD  acetaminophen (TYLENOL) 500 MG tablet Take 1,000 mg by mouth as needed.    [provider]  allopurinol  (ZYLOPRIM) 100 MG tablet Take 100 mg by mouth 2 (two) times daily.     [provider]  amLODipine (NORVASC) 10 MG tablet Take 10 mg by mouth daily. 08/13/16   [provider]  aspirin 81 MG tablet Take 81 mg by mouth at bedtime.     [provider]  atorvastatin (LIPITOR) 80 MG tablet Take 40 mg by mouth daily.    [provider]  clopidogrel (PLAVIX) 75 MG tablet Take 75 mg by mouth daily.     [provider]  diazepam (VALIUM) 5 MG tablet Take 1 tablet (5 mg total) by mouth at bedtime. 07/04/15   [name], MD  furosemide (LASIX) 40 MG tablet 2 tabs two times per day.    [provider]  gabapentin (NEURONTIN) 100 MG capsule Take 1 capsule (100 mg total) by mouth daily. Patient taking differently: 2 in the am 2 in the pm and 2 if needed 01/21/15   [name], MD  insulin glargine (LANTUS) 100 UNIT/ML injection Inject 0.2 mLs (20 Units total) into the skin 2 (two) times daily. And syringes 5/day 11/19/16   Renato Shin, MD  insulin lispro (HUMALOG) 100 UNIT/ML injection Inject 0.25 mLs (25 Units total) into the skin 3 (three) times daily before meals. 11/19/16   Renato Shin, MD  Magnesium 200 MG TABS Take 200 mg by mouth daily. 2 tablets after lunch    [provider]  mycophenolate (MYFORTIC) 180 MG EC tablet Take 180 mg by mouth 2 (two) times daily.    [provider]  omeprazole (PRILOSEC) 20 MG capsule Take 20 mg by mouth daily.    [provider]  solifenacin (VESICARE) 5 MG tablet Take 5 mg by mouth every morning.     [provider]  Tacrolimus 1 MG CP24 Take 4 mg by mouth 2 (two) times daily.     [provider]  traMADol (ULTRAM) 50 MG tablet Take 1 tablet (50 mg total) by mouth every 8 (eight) hours as needed. For pain. Take everyday per patient Patient taking differently: Take 50 mg by mouth every 8 (eight) hours as needed (takes with amoxicillin). For pain. Take everyday per  patient 07/25/14   [name], MD    Family History Family History  Problem Relation Age of Onset  . Heart disease Father   . Renal Disease Father   . Dementia Mother   . Diabetes Sister   . Heart disease Sister   . Diabetes Maternal Aunt   . Diabetes Maternal Uncle   . Diabetes Paternal Aunt   . Diabetes Paternal Uncle   . Heart disease Unknown   . Heart disease Brother     Social History Social History  Substance Use Topics  . Smoking status: Never Smoker  .  Smokeless tobacco: Never Used  . Alcohol use No     Allergies   Patient has no known allergies.   Review of Systems Review of Systems  Constitutional: Negative for chills and fever.  HENT: Negative for sore throat.   Eyes: Negative for redness.  Respiratory: Positive for cough and shortness of breath.   Cardiovascular: Positive for chest pain.  Gastrointestinal: Negative for abdominal pain and vomiting.  Endocrine: Negative for polyuria.  Genitourinary: Negative for dysuria and flank pain.  Musculoskeletal: Negative for back pain and neck pain.  Skin: Negative for rash.  Neurological: Negative for weakness, numbness and headaches.  Hematological: Does not bruise/bleed easily.  Psychiatric/Behavioral: Negative for confusion.     Physical Exam Updated Vital Signs There were no vitals taken for this visit.  Physical Exam  Constitutional: He appears well-developed and well-nourished. No distress.  HENT:  Head: Atraumatic.  Mouth/Throat: Oropharynx is clear and moist.  Eyes: Pupils are equal, round, and reactive to light. Conjunctivae are normal.  Neck: Neck supple. No tracheal deviation present.  No bruits.   Cardiovascular: Normal rate, regular rhythm, normal heart sounds and intact distal pulses.  Exam reveals no gallop and no friction rub.   No murmur heard. Pulmonary/Chest: Effort normal and breath sounds normal. No accessory muscle usage. No respiratory distress.  Abdominal: Soft. Bowel  sounds are normal. He exhibits no distension. There is no tenderness. There is no guarding.  Genitourinary:  Genitourinary Comments: No cva tenderness  Musculoskeletal: He exhibits no edema.  Bilateral ankle and lower leg edema, symmetric. Distal pulses palp bil extremities.   Tenderness left shoulder. CTLS spine, non tender, aligned, no step off.   Neurological: He is alert.  Speech clear/fluent. Motor intact bil. sens grossly intact.   Skin: Skin is warm and dry. He is not diaphoretic.  Psychiatric: He has a normal mood and affect.  Nursing note and vitals reviewed.    ED Treatments / Results  Labs (all labs ordered are listed, but only abnormal results are displayed) Results for orders placed or performed during the hospital encounter of 01/19/17  CBC  Result Value Ref Range   WBC 3.4 (L) 4.0 - 10.5 K/uL   RBC 5.08 4.22 - 5.81 MIL/uL   Hemoglobin 13.3 13.0 - 17.0 g/dL   HCT 42.3 39.0 - 52.0 %   MCV 83.3 78.0 - 100.0 fL   MCH 26.2 26.0 - 34.0 pg   MCHC 31.4 30.0 - 36.0 g/dL   RDW 14.6 11.5 - 15.5 %   Platelets 209 150 - 400 K/uL  Comprehensive metabolic panel  Result Value Ref Range   Sodium 137 135 - 145 mmol/L   Potassium 3.9 3.5 - 5.1 mmol/L   Chloride 101 101 - 111 mmol/L   CO2 26 22 - 32 mmol/L   Glucose, Bld 252 (H) 65 - 99 mg/dL   BUN 19 6 - 20 mg/dL   Creatinine, Ser 1.68 (H) 0.61 - 1.24 mg/dL   Calcium 9.5 8.9 - 10.3 mg/dL   Total Protein 6.4 (L) 6.5 - 8.1 g/dL   Albumin 3.6 3.5 - 5.0 g/dL   AST 20 15 - 41 U/L   ALT 17 17 - 63 U/L   Alkaline Phosphatase 95 38 - 126 U/L   Total Bilirubin 0.8 0.3 - 1.2 mg/dL   GFR calc non Af Amer 40 (L) >60 mL/min   GFR calc Af Amer 47 (L) >60 mL/min   Anion gap 10 5 - 15  Brain natriuretic  peptide  Result Value Ref Range   B Natriuretic Peptide 23.9 0.0 - 100.0 pg/mL  I-stat troponin, ED  Result Value Ref Range   Troponin i, poc 0.03 0.00 - 0.08 ng/mL   Comment 3           Dg Chest 2 View  Result Date:  01/19/2017 CLINICAL DATA:  Chest tightness and shortness of breath after a fall today. History of previous MI, CVA, hypertension, renal transplant, nonsmoker. EXAM: CHEST  2 VIEW COMPARISON:  Chest x-ray of June 06, 2015 FINDINGS: The lungs are adequately inflated. There is no focal infiltrate. There is no pleural effusion. The cardiac silhouette is enlarged. The pulmonary vascularity is mildly engorged. There is calcification in the wall of the aortic arch. The bony thorax exhibits no acute abnormality. IMPRESSION: Mild cardiomegaly without pulmonary vascular congestion. No pneumonia. Thoracic aortic atherosclerosis. Electronically Signed   By: David  Martinique M.D.   On: 01/19/2017 13:33   Dg Shoulder Left  Result Date: 01/19/2017 CLINICAL DATA:  Status post fall with persistent pain, preexisting decreased range of motion in the left shoulder due to previous CVA. EXAM: LEFT SHOULDER - 2+ VIEW COMPARISON:  Limited views of the left shoulder from a CT scan the chest dated June 06, 2015 FINDINGS: The bones are subjectively adequately mineralized. There is mild irregularity of the tibial tuberosity but no discrete fracture is observed. The glenohumeral joint space is well-maintained. The [facility] joint space is normal where visualized. The subacromial subdeltoid space is normal. IMPRESSION: No acute bony abnormality is observed. There is irregularity of the greater tuberosity that is likely degenerative. There may be some calcific tendinitis near the insertion of the supraspinatus tendon. Electronically Signed   By: David  Martinique M.D.   On: 01/19/2017 13:40    EKG  EKG Interpretation None       Radiology Dg Chest 2 View  Result Date: 01/19/2017 CLINICAL DATA:  Chest tightness and shortness of breath after a fall today. History of previous MI, CVA, hypertension, renal transplant, nonsmoker. EXAM: CHEST  2 VIEW COMPARISON:  Chest x-ray of June 06, 2015 FINDINGS: The lungs are adequately inflated. There  is no focal infiltrate. There is no pleural effusion. The cardiac silhouette is enlarged. The pulmonary vascularity is mildly engorged. There is calcification in the wall of the aortic arch. The bony thorax exhibits no acute abnormality. IMPRESSION: Mild cardiomegaly without pulmonary vascular congestion. No pneumonia. Thoracic aortic atherosclerosis. Electronically Signed   By: David  Martinique M.D.   On: 01/19/2017 13:33   Dg Shoulder Left  Result Date: 01/19/2017 CLINICAL DATA:  Status post fall with persistent pain, preexisting decreased range of motion in the left shoulder due to previous CVA. EXAM: LEFT SHOULDER - 2+ VIEW COMPARISON:  Limited views of the left shoulder from a CT scan the chest dated June 06, 2015 FINDINGS: The bones are subjectively adequately mineralized. There is mild irregularity of the tibial tuberosity but no discrete fracture is observed. The glenohumeral joint space is well-maintained. The [facility] joint space is normal where visualized. The subacromial subdeltoid space is normal. IMPRESSION: No acute bony abnormality is observed. There is irregularity of the greater tuberosity that is likely degenerative. There may be some calcific tendinitis near the insertion of the supraspinatus tendon. Electronically Signed   By: David  Martinique M.D.   On: 01/19/2017 13:40    Procedures Procedures (including critical care time)  Medications Ordered in ED Medications  0.9 %  sodium chloride infusion (not administered)  Initial Impression / Assessment and Plan / ED Course  I have reviewed the triage vital signs and the nursing notes.  Pertinent labs & imaging results that were available during my care of the patient were reviewed by me and considered in my medical decision making (see chart for details).  Iv ns. Ecg. Cxr. Labs.  Reviewed nursing notes and prior charts for additional history.   cxr neg acute, bnp normal - no indication of acute chf.   Pt breathing comfortably in  ED while laying supine, no increased wob.   Trop neg.   Renal fxn c/w baseline.   Pt currently appears stable for d/c.     Final Clinical Impressions(s) / ED Diagnoses   Final diagnoses:  None    New Prescriptions New Prescriptions   No medications on file     Lajean Saver, MD 01/19/17 [phone]

## 2017-01-27 ENCOUNTER — Encounter: Payer: Self-pay | Admitting: Podiatry

## 2017-01-27 ENCOUNTER — Ambulatory Visit (INDEPENDENT_AMBULATORY_CARE_PROVIDER_SITE_OTHER): Payer: Non-veteran care | Admitting: Podiatry

## 2017-01-27 DIAGNOSIS — M79676 Pain in unspecified toe(s): Secondary | ICD-10-CM | POA: Diagnosis not present

## 2017-01-27 DIAGNOSIS — E1159 Type 2 diabetes mellitus with other circulatory complications: Secondary | ICD-10-CM

## 2017-01-27 DIAGNOSIS — B351 Tinea unguium: Secondary | ICD-10-CM

## 2017-01-27 NOTE — Progress Notes (Signed)
Complaint:  Visit Type: Patient returns to my office for continued preventative foot care services. Complaint: Patient states" my nails have grown long and thick and become painful to walk and wear shoes" Patient has been diagnosed with DM with no foot complications. The patient presents for preventative foot care services. No changes to ROS.  Patient gives history of falling and injuring second toe left foot.  Podiatric Exam: Vascular: dorsalis pedis and posterior tibial pulses are palpable bilateral. Capillary return is immediate. Temperature gradient is WNL. Skin turgor WNL  Sensorium: Normal Semmes Weinstein monofilament test. Normal tactile sensation bilaterally. Nail Exam: Pt has thick disfigured discolored nails with subungual debris noted bilateral entire nail hallux through fifth toenails.  There is blister noted subungually second toe left foot.  Sweeling at proximal nail fold second toe left foot. Ulcer Exam: There is no evidence of ulcer or pre-ulcerative changes or infection. Orthopedic Exam: Muscle tone and strength are WNL. No limitations in general ROM. No crepitus or effusions noted. Foot type and digits show no abnormalities. Bony prominences are unremarkable. Skin: No Porokeratosis. No infection or ulcers  Diagnosis:  Onychomycosis, , Pain in right toe, pain in left toes  Subungual hematoma second toe left foot.  Treatment & Plan Procedures and Treatment: Consent by patient was obtained for treatment procedures. The patient understood the discussion of treatment and procedures well. All questions were answered thoroughly reviewed. Debridement of mycotic and hypertrophic toenails, 1 through 5 bilateral and clearing of subungual debris. No ulceration, no infection noted.  Evacuation of blister second left.  Neosporin/DSD.   Return Visit-Office Procedure: Patient instructed to return to the office for a follow up visit 3 months for continued evaluation and treatment.    Gardiner Barefoot DPM

## 2017-01-28 ENCOUNTER — Ambulatory Visit (INDEPENDENT_AMBULATORY_CARE_PROVIDER_SITE_OTHER): Payer: Medicare Other | Admitting: Family Medicine

## 2017-01-28 ENCOUNTER — Encounter: Payer: Self-pay | Admitting: Family Medicine

## 2017-01-28 VITALS — BP 116/58 | HR 98 | Temp 99.6°F | Ht 70.0 in | Wt 260.2 lb

## 2017-01-28 DIAGNOSIS — E1142 Type 2 diabetes mellitus with diabetic polyneuropathy: Secondary | ICD-10-CM

## 2017-01-28 DIAGNOSIS — W19XXXA Unspecified fall, initial encounter: Secondary | ICD-10-CM

## 2017-01-28 DIAGNOSIS — I10 Essential (primary) hypertension: Secondary | ICD-10-CM

## 2017-01-28 DIAGNOSIS — I2510 Atherosclerotic heart disease of native coronary artery without angina pectoris: Secondary | ICD-10-CM

## 2017-01-28 NOTE — Assessment & Plan Note (Signed)
Controlled on amlodipine 10mg , lasix 40mg  daily

## 2017-01-28 NOTE — Patient Instructions (Signed)
Glad you are feeling better  We will call you within a week or two about your referral to physical therapy. If you do not hear within 3 weeks, give [place] a call.

## 2017-01-28 NOTE — Assessment & Plan Note (Signed)
Likely contributed to fall. Refer to gait and balance training with PT. I did encourage him to use his walker to help prevent falls

## 2017-01-28 NOTE — Progress Notes (Signed)
Subjective:  [name] is a 66 y.o. year old very pleasant male patient who presents for/with See problem oriented charting ROS- feels like he continues to have balance issues. No further chest pain. doesnot feel short of breath. No increased edema.    Past Medical History-  Patient Active Problem List   Diagnosis Date Noted  . 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 Renal transplant recipient 06/27/2015    Priority: High  . Ascending aorta dilatation-4.7 cm per [facility] 2014 10/23/2014    Priority: High  . Diabetic neuropathy ([place]) 10/14/2010    Priority: High  . CAD -s/p DES to marginal vessel at [facility] 04/09/2009    Priority: High  . Diabetes mellitus due to underlying condition with diabetic autonomic (poly)neuropathy ([place]) 09/05/2007    Priority: High  . Gout 12/22/2009    Priority: Medium  . Obstructive sleep apnea 07/10/2009    Priority: Medium  . BPH (benign prostatic hyperplasia) 09/25/2008    Priority: Medium  . Low back pain 12/15/2007    Priority: Medium  . Posttraumatic stress disorder 11/03/2007    Priority: Medium  . Essential hypertension 01/17/2007    Priority: Medium  . CVA (cerebral infarction) 01/17/2007    Priority: Medium  . GERD (gastroesophageal reflux disease) 05/16/2014    Priority: Low  . Trigger thumb of right hand 01/03/2014    Priority: Low  . Left foot drop 05/02/2013    Priority: Low  . Risk for falls 01/22/2012    Priority: Low  . Gastroparesis 06/19/2008    Priority: Low  . Allergic rhinitis 03/01/2007    Priority: Low  . Hyperlipidemia 01/16/2016  . Orthostatic hypotension 01/15/2015  . Fall at home 01/15/2015    Medications- reviewed and updated Current Outpatient Prescriptions  Medication Sig Dispense Refill  . ACCU-CHEK AVIVA PLUS test strip USE AS DIRECTED CHECK BLOOD SUGAR TWICE A DAY 200 each 3  . ACCU-CHEK SOFTCLIX LANCETS lancets Use three times  daily 200 each 3  . acetaminophen (TYLENOL) 500 MG tablet Take 1,000 mg by mouth as needed.    [name] allopurinol (ZYLOPRIM) 100 MG tablet Take 100 mg by mouth 2 (two) times daily.     [name] amLODipine (NORVASC) 10 MG tablet Take 10 mg by mouth daily.    [name] aspirin 81 MG tablet Take 81 mg by mouth at bedtime.     [name] atorvastatin (LIPITOR) 80 MG tablet Take 40 mg by mouth daily.    . clopidogrel (PLAVIX) 75 MG tablet Take 75 mg by mouth daily.     . diazepam (VALIUM) 5 MG tablet Take 1 tablet (5 mg total) by mouth at bedtime. 30 tablet 0  . furosemide (LASIX) 40 MG tablet 2 tabs two times per day.    . gabapentin (NEURONTIN) 100 MG capsule Take 1 capsule (100 mg total) by mouth daily. (Patient taking differently: 2 in the am 2 in the pm and 2 if needed) 30 capsule 3  . insulin glargine (LANTUS) 100 UNIT/ML injection Inject 0.2 mLs (20 Units total) into the skin 2 (two) times daily. And syringes 5/day 50 mL 3  . insulin lispro (HUMALOG) 100 UNIT/ML injection Inject 0.25 mLs (25 Units total) into the skin 3 (three) times daily before meals. 80 mL 3  . Magnesium 200 MG TABS Take 200 mg by mouth daily. 2 tablets after lunch    . mycophenolate (MYFORTIC) 180 MG EC tablet Take 180 mg by  mouth 2 (two) times daily.    [name] omeprazole (PRILOSEC) 20 MG capsule Take 20 mg by mouth daily.    . solifenacin (VESICARE) 5 MG tablet Take 5 mg by mouth every morning.     . Tacrolimus 1 MG CP24 Take 4 mg by mouth 2 (two) times daily.     . traMADol (ULTRAM) 50 MG tablet Take 1 tablet (50 mg total) by mouth every 8 (eight) hours as needed. For pain. Take everyday per patient (Patient taking differently: Take 50 mg by mouth every 8 (eight) hours as needed (takes with amoxicillin). For pain. Take everyday per patient) 30 tablet 1   No current facility-administered medications for this visit.     Objective: BP (!) 116/58 (BP Location: Left Arm, Patient Position: Sitting, Cuff Size: Large)   Pulse 98   Temp 99.6 F (37.6 C)  (Oral)   Ht 5\' 10"  (1.778 m)   Wt 260 lb 3.2 oz (118 kg)   SpO2 93%   BMI 37.33 kg/m  Gen: NAD, resting comfortably CV: RRR no murmurs rubs or gallops Lungs: CTAB no crackles, wheeze, rhonchi Abdomen: soft/nontender/nondistended/normal bowel sounds. No rebound or guarding.  Ext: 1+ edema Skin: warm, dry  Assessment/Plan:  Diabetic polyneuropathy associated with type 2 diabetes mellitus ([place]) - Plan: Ambulatory referral to Physical Therapy  Fall, initial encounter - Plan: Ambulatory referral to Physical Therapy  Coronary artery disease involving native coronary artery of native heart without angina pectoris  Essential hypertension S: Patient seen in the ED on 01/19/17. He felt like "fluids were bubbling up". Complained of orthopnea and mild SOB. Symptoms started after tripping and falling against left shoulder before he had both shoes on. Originally called in complaining of CP and SOB and was sent to the ED> He stated at that time it felt like his heart attack and that he had gained nearly 5 lbs. Has mild edema at time of ED visit.   EKG low risk for acute cardiac event. Labs without acute cause- BNP not elevated. Was actually given IV fluids and helped. CXR did not show acute abnormality- with low BNP and CXR without pulmonary edema- thought unlikely acute CHF. Troponin was negative and patient discharged as thought low risk cardiac. Renal function was at baseline- mild elevations in creatinine despite transplant but better than prior pretransplant levels. WBC slightly low at 3.4. Had chest pain at that time but that has resolved completely.    Since the fall left thumb has started to trigger again. Having issues with mobility- feels off balance often due to his back pain and Neuropathy which also contributes.  A/P: Patient with a fall about 5 days ago which seemed to promote anxiety which expressed itself with chest tightness and shortness of breath. Fall likely promoted by neuropathy as  well as poor balance- he requests referral to PT to work on this and he was referred today.   Lab Results  Component Value Date   HGBA1C 11.3 11/19/2016  some trigger finger of left thumb but with recent a1c would not advise injection- encouraged endocrine follow up.   CAD -s/p DES to marginal vessel at [facility] ED evaluation for chest pain after fall. Symptoms have resolved completely. Troponin and EKG largely unchanged in ED. No further cardiac workup warranted- pain was not exertional, not relieved by rest, worse with anxiety and has not recurred. He remains compliant with statin, plavix.   Diabetic neuropathy ([place]) Likely contributed to fall. Refer to gait and balance training  with PT. I did encourage him to use his walker to help prevent falls  Essential hypertension Controlled on amlodipine 10mg , lasix 40mg  daily   Future Appointments Date Time Provider [place]  02/19/2017 11:00 AM Renato Shin, MD [id number] None  04/30/2017 10:45 AM Gardiner Barefoot, DPM [facility]   PRN follow up for acute issue (ED visit)  Orders Placed This Encounter  Procedures  . Ambulatory referral to Physical Therapy    Referral Priority:   Routine    Referral Type:   Physical Medicine    Referral Reason:   Specialty Services Required    Requested Specialty:   Physical Therapy    Number of Visits Requested:   1    No orders of the defined types were placed in this encounter.   Return precautions advised.  Garret Reddish, MD

## 2017-01-28 NOTE — Assessment & Plan Note (Signed)
ED evaluation for chest pain after fall. Symptoms have resolved completely. Troponin and EKG largely unchanged in ED. No further cardiac workup warranted- pain was not exertional, not relieved by rest, worse with anxiety and has not recurred. He remains compliant with statin, plavix.

## 2017-02-04 ENCOUNTER — Ambulatory Visit: Payer: Medicare Other | Attending: Family Medicine | Admitting: Physical Therapy

## 2017-02-16 DIAGNOSIS — M109 Gout, unspecified: Secondary | ICD-10-CM | POA: Diagnosis not present

## 2017-02-16 DIAGNOSIS — Z23 Encounter for immunization: Secondary | ICD-10-CM | POA: Diagnosis not present

## 2017-02-16 DIAGNOSIS — Z940 Kidney transplant status: Secondary | ICD-10-CM | POA: Diagnosis not present

## 2017-02-16 DIAGNOSIS — E785 Hyperlipidemia, unspecified: Secondary | ICD-10-CM | POA: Diagnosis not present

## 2017-02-16 DIAGNOSIS — I129 Hypertensive chronic kidney disease with stage 1 through stage 4 chronic kidney disease, or unspecified chronic kidney disease: Secondary | ICD-10-CM | POA: Diagnosis not present

## 2017-02-16 DIAGNOSIS — E1129 Type 2 diabetes mellitus with other diabetic kidney complication: Secondary | ICD-10-CM | POA: Diagnosis not present

## 2017-02-16 LAB — CBC AND DIFFERENTIAL
HCT: 43 (ref 41–53)
Hemoglobin: 14.6 (ref 13.5–17.5)
Neutrophils Absolute: 4
Platelets: 246 (ref 150–399)
WBC: 5

## 2017-02-16 LAB — LIPID PANEL
Cholesterol: 198 (ref 0–200)
HDL: 46 (ref 35–70)
LDL Cholesterol: 85
TRIGLYCERIDES: 333 — AB (ref 40–160)

## 2017-02-16 LAB — BASIC METABOLIC PANEL
BUN: 19 (ref 4–21)
CREATININE: 1.5 — AB (ref 0.6–1.3)
GLUCOSE: 136
POTASSIUM: 3.7 (ref 3.4–5.3)
SODIUM: 140 (ref 137–147)

## 2017-02-16 LAB — HEMOGLOBIN A1C: HEMOGLOBIN A1C: 10.2

## 2017-02-16 LAB — HEPATIC FUNCTION PANEL
ALK PHOS: 124 (ref 25–125)
ALT: 15 (ref 10–40)
AST: 14 (ref 14–40)
BILIRUBIN, TOTAL: 0.5

## 2017-02-19 ENCOUNTER — Encounter: Payer: Self-pay | Admitting: Endocrinology

## 2017-02-19 ENCOUNTER — Ambulatory Visit (INDEPENDENT_AMBULATORY_CARE_PROVIDER_SITE_OTHER): Payer: Medicare Other | Admitting: Endocrinology

## 2017-02-19 VITALS — BP 116/74 | HR 85 | Wt 254.0 lb

## 2017-02-19 DIAGNOSIS — Z794 Long term (current) use of insulin: Secondary | ICD-10-CM | POA: Diagnosis not present

## 2017-02-19 DIAGNOSIS — E0843 Diabetes mellitus due to underlying condition with diabetic autonomic (poly)neuropathy: Secondary | ICD-10-CM | POA: Diagnosis not present

## 2017-02-19 LAB — POCT GLYCOSYLATED HEMOGLOBIN (HGB A1C): HEMOGLOBIN A1C: 10

## 2017-02-19 MED ORDER — INSULIN LISPRO 100 UNIT/ML ~~LOC~~ SOLN: 40.0000 [IU] | Freq: Three times a day (TID) | SUBCUTANEOUS | 3 refills | Status: DC

## 2017-02-19 MED ORDER — INSULIN GLARGINE 100 UNIT/ML ~~LOC~~ SOLN: 20.0000 [IU] | Freq: Every day | SUBCUTANEOUS | 3 refills | Status: DC

## 2017-02-19 NOTE — Progress Notes (Signed)
Subjective:    Patient ID: [name], male    DOB: Jul 27, 1950, 66 y.o.   MRN: [id number]  HPI  Pt returns for f/u of diabetes mellitus: DM type:  Dx'ed: 9379 Complications: polyneuropathy, CVA, CAD, renal failure, and gastroparesis Therapy: insulin since soon after dx.  DKA: never Severe hypoglycemia: 1 episode (approx 1998) Pancreatitis: never Pancreatic imaging:  Other: he takes multiple daily injections Interval history: Pt says he never misses the insulin.  no cbg record, but states cbg's vary from 102-472.  It is in general higher as the day goes on.  pt states he feels well in general. Past Medical History:  Diagnosis Date  . Allergy   . Angina    LAST HAD CP 2 MO AGO  DR WALL   . Arthritis   . Backache, unspecified   . Chest pain, unspecified   . CHF (congestive heart failure) ([place])   . Chills   . Chronic kidney disease, stage III (moderate)    HD T- TH-SAT  . Complication of anesthesia    01/2011 could not eat,, hospt x2, was placed on hd and cleared up  . Coronary atherosclerosis of native coronary artery   . Diabetes mellitus 2004  . Dizziness   . Dysphagia, unspecified(787.20)   . Gastroparesis   . Headache(784.0)   . Hemorrhage of rectum and anus   . Hyperlipidemia   . Hypertrophy of prostate with urinary obstruction and other lower urinary tract symptoms (LUTS)   . INTERNAL HEMORRHOIDS 10/16/2008   Qualifier: Diagnosis of  By: Nolon Rod CMA (AAMA), Robin    . Internal hemorrhoids without mention of complication   . Myocardial infarction ([place]) 2002  . Nausea alone   . Obesity   . Other dyspnea and respiratory abnormality   . Other malaise and fatigue   . Personal history of unspecified circulatory disease   . Posttraumatic stress disorder   . Sleep apnea    USES CPAP   . Stroke [facility])    2007  . Unspecified essential hypertension    hx htn     Past Surgical History:  Procedure Laterality Date  . ANAL FISSURECTOMY    . AV FISTULA  PLACEMENT    . CARDIAC CATHETERIZATION     2005 DR BRODIE  . HEMORRHOID SURGERY    . revision of fistula     renal failure  . VENTRAL HERNIA REPAIR  07/01/2011   Procedure: HERNIA REPAIR VENTRAL ADULT;  Surgeon: Joyice Faster. Cornett, MD;  Location: [facility];  Service: General;  Laterality: N/A;    Social History   Social History  . Marital status: Married    Spouse name: N/A  . Number of children: N/A  . Years of education: N/A   Occupational History  . disabled Disabled   Social History Main Topics  . Smoking status: Never Smoker  . Smokeless tobacco: Never Used  . Alcohol use No  . Drug use: No  . Sexual activity: Not Currently   Other Topics Concern  . Not on file   Social History Narrative   Married 4783. 23 year old son in 73. 1 granddaughter from [place].       Retired from [employer] 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 ACCU-CHEK AVIVA PLUS test strip USE  AS DIRECTED CHECK BLOOD SUGAR TWICE A DAY 200 each 3  . ACCU-CHEK SOFTCLIX LANCETS lancets Use three times daily 200 each 3  . acetaminophen (TYLENOL) 500 MG tablet Take 1,000 mg by mouth as needed.    [name] allopurinol (ZYLOPRIM) 100 MG tablet Take 100 mg by mouth 2 (two) times daily.     [name] amLODipine (NORVASC) 10 MG tablet Take 10 mg by mouth daily.    [name] aspirin 81 MG tablet Take 81 mg by mouth at bedtime.     [name] atorvastatin (LIPITOR) 80 MG tablet Take 40 mg by mouth daily.    . clopidogrel (PLAVIX) 75 MG tablet Take 75 mg by mouth daily.     . diazepam (VALIUM) 5 MG tablet Take 1 tablet (5 mg total) by mouth at bedtime. 30 tablet 0  . furosemide (LASIX) 40 MG tablet 2 tabs two times per day.    . gabapentin (NEURONTIN) 100 MG capsule Take 1 capsule (100 mg total) by mouth daily. (Patient taking differently: 4 (four)  times daily. 2 in the am 2 in the pm and 2 if needed) 30 capsule 3  . Magnesium 200 MG TABS Take 200 mg by mouth daily. 2 tablets after lunch    . mycophenolate (MYFORTIC) 180 MG EC tablet Take 180 mg by mouth 2 (two) times daily.    [name] omeprazole (PRILOSEC) 20 MG capsule Take 20 mg by mouth daily.    . solifenacin (VESICARE) 5 MG tablet Take 5 mg by mouth every morning.     . Tacrolimus 1 MG CP24 Take 4 mg by mouth 2 (two) times daily.     . traMADol (ULTRAM) 50 MG tablet Take 1 tablet (50 mg total) by mouth every 8 (eight) hours as needed. For pain. Take everyday per patient (Patient taking differently: Take 50 mg by mouth every 8 (eight) hours as needed (takes with amoxicillin). For pain. Take everyday per patient) 30 tablet 1   No current facility-administered medications on file prior to visit.     No Known Allergies  Family History  Problem Relation Age of Onset  . Heart disease Father   . Renal Disease Father   . Dementia Mother   . Diabetes Sister   . Heart disease Sister   . Diabetes Maternal Aunt   . Diabetes Maternal Uncle   . Diabetes Paternal Aunt   . Diabetes Paternal Uncle   . Heart disease Unknown   . Heart disease Brother     BP 116/74   Pulse 85   Wt 254 lb (115.2 kg)   SpO2 95%   BMI 36.45 kg/m   Review of Systems He denies hypoglycemia    Objective:   Physical Exam VITAL SIGNS:  See vs page GENERAL: no distress. Pulses: foot pulses are intact bilaterally.   MSK: no deformity of the feet or ankles.  CV: 1+ bilat edema of the legs or ankles Skin:  no ulcer on the feet or ankles.  normal color and temp on the feet and ankles Neuro: sensation is intact to touch on the feet and ankles, but decreased from normal.   Lab Results  Component Value Date   HGBA1C 10.0 02/19/2017       Assessment & Plan:  Insulin-requiring type 2 DM, with CVA: he needs increased rx Renal failure: this is the likely the reason he needs little if any basal insulin.    Patient Instructions  check your blood sugar 4 times a day: before the  3 meals, and at bedtime.  also check if you have symptoms of your blood sugar being too high or too low.  please keep a record of the readings and bring it to your next appointment here (or you can bring the meter itself).  You can write it on any piece of paper.  please call us sooner if your blood sugar goes below 70, or if you have a lot of readings over 200.  For now, please:  Increase humalog to 40 units, 3 times a day (just before each meal), and:  Reduce lantus to 20 units at bedtime only, and:  Please call or message [place] next week, to tell us how the blood sugar is doing.   Please come back for a follow-up appointment in 1 month.

## 2017-02-19 NOTE — Patient Instructions (Addendum)
check your blood sugar 4 times a day: before the 3 meals, and at bedtime.  also check if you have symptoms of your blood sugar being too high or too low.  please keep a record of the readings and bring it to your next appointment here (or you can bring the meter itself).  You can write it on any piece of paper.  please call us sooner if your blood sugar goes below 70, or if you have a lot of readings over 200.  For now, please:  Increase humalog to 40 units, 3 times a day (just before each meal), and:  Reduce lantus to 20 units at bedtime only, and:  Please call or message [place] next week, to tell us how the blood sugar is doing.   Please come back for a follow-up appointment in 1 month.

## 2017-02-22 ENCOUNTER — Telehealth: Payer: Self-pay | Admitting: Family Medicine

## 2017-02-22 NOTE — Telephone Encounter (Signed)
May fill 100mg  gabapentin 2 in the am, 2 in the pm, and 2 if needed at night

## 2017-02-22 NOTE — Telephone Encounter (Signed)
MEDICATION: gabapentin (NEURONTIN) 100 MG capsule  PHARMACY:   [id number] [place], [place] - [place] [phone] (Phone) [phone] (Fax)     IS THIS A 90 DAY SUPPLY : no  IS PATIENT OUT OF MEDICATION: no  IF NOT; HOW MUCH IS LEFT: 1  LAST APPOINTMENT DATE: 01/28/17  NEXT APPOINTMENT DATE:no appointment OTHER COMMENTS:  Patient cannot get this filled by the [facility] until 03/10/17. He wants to know if Dr. Yong Channel will fill it until then.   **Let patient know to contact pharmacy at the end of the day to make sure medication is ready. **  ** Please notify patient to allow 48-72 hours to process**  **Encourage patient to contact the pharmacy for refills or they can request refills through [facility]**

## 2017-02-23 ENCOUNTER — Other Ambulatory Visit: Payer: Self-pay

## 2017-02-23 MED ORDER — GABAPENTIN 100 MG PO CAPS: ORAL_CAPSULE | ORAL | 3 refills | Status: DC

## 2017-02-23 NOTE — Telephone Encounter (Signed)
Prescription sent to pharmacy.

## 2017-02-24 ENCOUNTER — Ambulatory Visit: Payer: Medicare Other | Attending: Family Medicine | Admitting: Physical Therapy

## 2017-03-05 ENCOUNTER — Encounter: Payer: Self-pay | Admitting: Family Medicine

## 2017-03-17 DIAGNOSIS — Z940 Kidney transplant status: Secondary | ICD-10-CM | POA: Diagnosis not present

## 2017-03-17 DIAGNOSIS — E1129 Type 2 diabetes mellitus with other diabetic kidney complication: Secondary | ICD-10-CM | POA: Diagnosis not present

## 2017-03-17 DIAGNOSIS — M109 Gout, unspecified: Secondary | ICD-10-CM | POA: Diagnosis not present

## 2017-03-17 DIAGNOSIS — I129 Hypertensive chronic kidney disease with stage 1 through stage 4 chronic kidney disease, or unspecified chronic kidney disease: Secondary | ICD-10-CM | POA: Diagnosis not present

## 2017-03-17 DIAGNOSIS — E785 Hyperlipidemia, unspecified: Secondary | ICD-10-CM | POA: Diagnosis not present

## 2017-03-17 LAB — BASIC METABOLIC PANEL
BUN: 18 (ref 4–21)
CREATININE: 1.6 — AB (ref 0.6–1.3)
GLUCOSE: 189
POTASSIUM: 3.8 (ref 3.4–5.3)
Sodium: 138 (ref 137–147)

## 2017-03-17 LAB — HEPATIC FUNCTION PANEL
ALT: 15 (ref 10–40)
AST: 15 (ref 14–40)
Alkaline Phosphatase: 126 — AB (ref 25–125)

## 2017-03-17 LAB — CBC AND DIFFERENTIAL
HCT: 40 — AB (ref 41–53)
Hemoglobin: 13.5 (ref 13.5–17.5)
PLATELETS: 234 (ref 150–399)
WBC: 4.9

## 2017-03-22 ENCOUNTER — Ambulatory Visit: Payer: Medicare Other | Admitting: Endocrinology

## 2017-03-22 DIAGNOSIS — Z0289 Encounter for other administrative examinations: Secondary | ICD-10-CM

## 2017-03-23 ENCOUNTER — Encounter: Payer: Self-pay | Admitting: Family Medicine

## 2017-03-26 ENCOUNTER — Encounter: Payer: Self-pay | Admitting: Endocrinology

## 2017-04-08 ENCOUNTER — Encounter: Payer: Self-pay | Admitting: Family Medicine

## 2017-04-08 ENCOUNTER — Ambulatory Visit (INDEPENDENT_AMBULATORY_CARE_PROVIDER_SITE_OTHER): Payer: Medicare Other | Admitting: Family Medicine

## 2017-04-08 VITALS — BP 110/64 | HR 80 | Temp 98.8°F | Ht 70.0 in | Wt 266.2 lb

## 2017-04-08 DIAGNOSIS — M25511 Pain in right shoulder: Secondary | ICD-10-CM | POA: Diagnosis not present

## 2017-04-08 DIAGNOSIS — E0843 Diabetes mellitus due to underlying condition with diabetic autonomic (poly)neuropathy: Secondary | ICD-10-CM | POA: Diagnosis not present

## 2017-04-08 DIAGNOSIS — Z794 Long term (current) use of insulin: Secondary | ICD-10-CM

## 2017-04-08 DIAGNOSIS — Z940 Kidney transplant status: Secondary | ICD-10-CM | POA: Diagnosis not present

## 2017-04-08 MED ORDER — CYCLOBENZAPRINE HCL 5 MG PO TABS: 2.5000 mg | ORAL_TABLET | Freq: Three times a day (TID) | ORAL | 0 refills | Status: DC | PRN

## 2017-04-08 NOTE — Progress Notes (Signed)
Subjective:  [name] is a 66 y.o. year old very pleasant male patient who presents for/with See problem oriented charting ROS- no fever, chills. No increased leg weakness or arm weakness. No worsening neuropathy   Past Medical History-  Patient Active Problem List   Diagnosis Date Noted  . 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 Renal transplant recipient 06/27/2015    Priority: High  . 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 Diabetic neuropathy ([place]) 10/14/2010    Priority: High  . CAD -s/p DES to marginal vessel at [facility] 04/09/2009    Priority: High  . Diabetes mellitus due to underlying condition with diabetic autonomic (poly)neuropathy ([place]) 09/05/2007    Priority: High  . Gout 12/22/2009    Priority: Medium  . Obstructive sleep apnea 07/10/2009    Priority: Medium  . BPH (benign prostatic hyperplasia) 09/25/2008    Priority: Medium  . Low back pain 12/15/2007    Priority: Medium  . Posttraumatic stress disorder 11/03/2007    Priority: Medium  . Essential hypertension 01/17/2007    Priority: Medium  . CVA (cerebral infarction) 01/17/2007    Priority: Medium  . GERD (gastroesophageal reflux disease) 05/16/2014    Priority: Low  . Trigger thumb of right hand 01/03/2014    Priority: Low  . Left foot drop 05/02/2013    Priority: Low  . Risk for falls 01/22/2012    Priority: Low  . Gastroparesis 06/19/2008    Priority: Low  . Allergic rhinitis 03/01/2007    Priority: Low  . Hyperlipidemia 01/16/2016  . Orthostatic hypotension 01/15/2015  . Fall at home 01/15/2015    Medications- reviewed and updated Current Outpatient Medications  Medication Sig Dispense Refill  . ACCU-CHEK AVIVA PLUS test strip USE AS DIRECTED CHECK BLOOD SUGAR TWICE A DAY 200 each 3  . ACCU-CHEK SOFTCLIX LANCETS lancets Use three times daily 200 each 3  . acetaminophen  (TYLENOL) 500 MG tablet Take 1,000 mg by mouth as needed.    [name] allopurinol (ZYLOPRIM) 100 MG tablet Take 100 mg by mouth 2 (two) times daily.     [name] amLODipine (NORVASC) 10 MG tablet Take 10 mg by mouth daily.    [name] aspirin 81 MG tablet Take 81 mg by mouth at bedtime.     [name] atorvastatin (LIPITOR) 80 MG tablet Take 40 mg by mouth daily.    . clopidogrel (PLAVIX) 75 MG tablet Take 75 mg by mouth daily.     . diazepam (VALIUM) 5 MG tablet Take 1 tablet (5 mg total) by mouth at bedtime. 30 tablet 0  . furosemide (LASIX) 40 MG tablet 2 tabs two times per day.    . gabapentin (NEURONTIN) 100 MG capsule 2 in the am, 2 in the pm, and 2 if needed at night 180 capsule 3  . insulin glargine (LANTUS) 100 UNIT/ML injection Inject 0.2 mLs (20 Units total) into the skin at bedtime. And syringes 5/day 50 mL 3  . insulin lispro (HUMALOG) 100 UNIT/ML injection Inject 0.4 mLs (40 Units total) into the skin 3 (three) times daily before meals. 80 mL 3  . Magnesium 200 MG TABS Take 200 mg by mouth daily. 2 tablets after lunch    . mycophenolate (MYFORTIC) 180 MG EC tablet Take 180 mg by mouth 2 (two) times daily.    [name] omeprazole (PRILOSEC) 20 MG capsule Take 20 mg by mouth daily.    [name]  solifenacin (VESICARE) 5 MG tablet Take 5 mg by mouth every morning.     . Tacrolimus 1 MG CP24 Take 4 mg by mouth 2 (two) times daily.     . traMADol (ULTRAM) 50 MG tablet Take 1 tablet (50 mg total) by mouth every 8 (eight) hours as needed. For pain. Take everyday per patient (Patient taking differently: Take 50 mg by mouth every 8 (eight) hours as needed (takes with amoxicillin). For pain. Take everyday per patient) 30 tablet 1   No current facility-administered medications for this visit.     Objective: BP 110/64 (BP Location: Right Arm, Patient Position: Sitting, Cuff Size: Large)   Pulse 80   Temp 98.8 F (37.1 C) (Oral)   Ht 5\' 10"  (1.778 m)   Wt 266 lb 3.2 oz (120.7 kg)   SpO2 98%   BMI 38.20 kg/m  Gen: NAD, resting  comfortably CV: RRR no murmurs rubs or gallops Lungs: CTAB no crackles, wheeze, rhonchi MSK: patient with muscle spasm and tenderness over left scapula. Pain when crosses arm across body. Also has some pain in same area with neck movement.  obese Ext: no edema Skin: warm, dry, no rash or bruising  Assessment/Plan:  Acute pain of right shoulder S: Fell Sunday morning at 3 30 morning- was up late working on a sermon. Taking bath in the bathtub with salted soap to help with diabetic skin. Got out of tub and put shoes on but got caught in the rug - run went one way and he went backwards into the tub with left shoulder blade. Also seems to have bothered neck some- if turns neck to left afterwards feels it into shoulder and neck. Some sorness into left forearm- thinks he braced himself. Really having a hard time sleeping due to pain. Pain can be moderate to severe aching pain. No midline pain in back.   Tried ice, tylenol and topical cream from [place]- aspercreme- none of these are helping much right now A/P: Right shoulder blade pain after fall. Strongly doubt fracture- offered x-ray which he declines stating he normally needs MRI- tried to talk him through why x-ray would be better initial imaging choices. He agrees if not improving within 2 weeks to call in for referral to orthopedics so they can potentially do films then decide if further imaging needed.   High risk patient with history of renal transplant. He is aware to avoid nsaids. Can use tylenol prn for pain. Prednisone may be good antiinflammatory alternate but a1c so poorly controlled not ideal. Discussed using heat at this point as several days out- see avs. Also has some muscle spasm so will use sparing flexeril- warned of imbalance and falls risk- he knows to stop if any issues. Warned no driving for 8 hours.   Future Appointments  Date Time Provider [place]  04/30/2017 10:45 AM Gardiner Barefoot, DPM [facility]    Meds  ordered this encounter  Medications  . cyclobenzaprine (FLEXERIL) 5 MG tablet    Sig: Take 0.5-1 tablets (2.5-5 mg total) 3 (three) times daily as needed by mouth for muscle spasms.    Dispense:  30 tablet    Refill:  0    Return precautions advised.  Garret Reddish, MD

## 2017-04-08 NOTE — Patient Instructions (Addendum)
Sent in muscle spasm medicine flexeril.. If you feel off balance with this or feel like you are more likely to fall please stop. You have muscle spasm over the shoulder blade where you fell.   I would not recommend ibuprofen/aleve/motrin. You can take tylenol as needed for the pain. Would also do heat 20 minutes 3-4x a day.   If in 2 weeks you have not improved- can send you to orthopedics. You declined x-ray for now

## 2017-04-26 ENCOUNTER — Ambulatory Visit (INDEPENDENT_AMBULATORY_CARE_PROVIDER_SITE_OTHER): Payer: Medicare Other | Admitting: Endocrinology

## 2017-04-26 ENCOUNTER — Encounter: Payer: Self-pay | Admitting: Endocrinology

## 2017-04-26 ENCOUNTER — Telehealth: Payer: Self-pay | Admitting: Endocrinology

## 2017-04-26 VITALS — BP 132/80 | HR 81 | Wt 262.2 lb

## 2017-04-26 DIAGNOSIS — E0843 Diabetes mellitus due to underlying condition with diabetic autonomic (poly)neuropathy: Secondary | ICD-10-CM | POA: Diagnosis not present

## 2017-04-26 DIAGNOSIS — Z794 Long term (current) use of insulin: Secondary | ICD-10-CM

## 2017-04-26 LAB — POCT GLYCOSYLATED HEMOGLOBIN (HGB A1C): Hemoglobin A1C: 9.2

## 2017-04-26 MED ORDER — INSULIN LISPRO PROT & LISPRO (75-25 MIX) 100 UNIT/ML ~~LOC~~ SUSP: SUBCUTANEOUS | 11 refills | Status: DC

## 2017-04-26 MED ORDER — INSULIN ASPART PROT & ASPART (70-30 MIX) 100 UNIT/ML ~~LOC~~ SUSP: SUBCUTANEOUS | 11 refills | Status: DC

## 2017-04-26 NOTE — Progress Notes (Signed)
Subjective:    Patient ID: [name], male    DOB: 05-09-1951, 66 y.o.   MRN: [id number]  HPI Pt returns for f/u of diabetes mellitus: DM type: Insulin-requiring type 2.  Dx'ed: 6073 Complications: polyneuropathy, CVA, CAD, renal failure (transplant 2017), and gastroparesis Therapy: insulin since soon after dx.  DKA: never Severe hypoglycemia: 1 episode (approx 1998) Pancreatitis: never Pancreatic imaging:  Other: he takes multiple daily injections; as expected, glycemic control worsened with transplant.   Interval history: Pt says he never misses the insulin.  no cbg record, but states cbg's vary from 127-200's.  It is still in general higher as the day goes on.  pt states he feels well in general.  He takes humalog, 40 units 3 times a day (just before each meal), and lantus 25 units BID.   Past Medical History:  Diagnosis Date  . Allergy   . Angina    LAST HAD CP 2 MO AGO  DR WALL [place]  . Arthritis   . Backache, unspecified   . Chest pain, unspecified   . CHF (congestive heart failure) ([place])   . Chills   . Chronic kidney disease, stage III (moderate) ([facility])    HD T- TH-SAT  . Complication of anesthesia    01/2011 could not eat,, hospt x2, was placed on hd and cleared up  . Coronary atherosclerosis of native coronary artery   . Diabetes mellitus 2004  . Dizziness   . Dysphagia, unspecified(787.20)   . Gastroparesis   . Headache(784.0)   . Hemorrhage of rectum and anus   . Hyperlipidemia   . Hypertrophy of prostate with urinary obstruction and other lower urinary tract symptoms (LUTS)   . INTERNAL HEMORRHOIDS 10/16/2008   Qualifier: Diagnosis of  By: Nolon Rod CMA (AAMA), Robin    . Internal hemorrhoids without mention of complication   . Myocardial infarction ([place]) 2002  . Nausea alone   . Obesity   . Other dyspnea and respiratory abnormality   . Other malaise and fatigue   . Personal history of unspecified circulatory disease   . Posttraumatic stress disorder    . Sleep apnea    USES CPAP   . Stroke [facility])    2007  . Unspecified essential hypertension    hx htn     Past Surgical History:  Procedure Laterality Date  . ANAL FISSURECTOMY    . AV FISTULA PLACEMENT    . CARDIAC CATHETERIZATION     2005 DR BRODIE  . HEMORRHOID SURGERY    . revision of fistula     renal failure  . VENTRAL HERNIA REPAIR  07/01/2011   Procedure: HERNIA REPAIR VENTRAL ADULT;  Surgeon: Joyice Faster. Cornett, MD;  Location: [facility];  Service: General;  Laterality: N/A;    Social History   Socioeconomic History  . Marital status: Married    Spouse name: Not on file  . Number of children: Not on file  . Years of education: Not on file  . Highest education level: Not on file  Social Needs  . Financial resource strain: Not on file  . Food insecurity - worry: Not on file  . Food insecurity - inability: Not on file  . Transportation needs - medical: Not on file  . Transportation needs - non-medical: Not on file  Occupational History  . Occupation: disabled    Employer: DISABLED  Tobacco Use  . Smoking status: Never Smoker  . Smokeless tobacco: Never Used  Substance and Sexual Activity  .  Alcohol use: No  . Drug use: No  . Sexual activity: Not Currently  Other Topics Concern  . Not on file  Social History Narrative   Married 7015. 82 year old son in 79. 1 granddaughter from [place].       Retired from [employer]. 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 ACCU-CHEK AVIVA PLUS test strip USE AS DIRECTED CHECK BLOOD SUGAR TWICE A DAY 200 each 3  . ACCU-CHEK SOFTCLIX LANCETS lancets Use three times daily 200 each 3  . acetaminophen (TYLENOL) 500 MG tablet Take 1,000 mg by mouth as needed.    [name] allopurinol (ZYLOPRIM) 100 MG tablet Take 100 mg by mouth 2 (two) times daily.      [name] amLODipine (NORVASC) 10 MG tablet Take 10 mg by mouth daily.    [name] aspirin 81 MG tablet Take 81 mg by mouth at bedtime.     [name] atorvastatin (LIPITOR) 80 MG tablet Take 40 mg by mouth daily.    . clopidogrel (PLAVIX) 75 MG tablet Take 75 mg by mouth daily.     . cyclobenzaprine (FLEXERIL) 5 MG tablet Take 0.5-1 tablets (2.5-5 mg total) 3 (three) times daily as needed by mouth for muscle spasms. 30 tablet 0  . diazepam (VALIUM) 5 MG tablet Take 1 tablet (5 mg total) by mouth at bedtime. 30 tablet 0  . furosemide (LASIX) 40 MG tablet 2 tabs two times per day.    . gabapentin (NEURONTIN) 100 MG capsule 2 in the am, 2 in the pm, and 2 if needed at night 180 capsule 3  . Magnesium 200 MG TABS Take 200 mg by mouth daily. 2 tablets after lunch    . mycophenolate (MYFORTIC) 180 MG EC tablet Take 180 mg by mouth 2 (two) times daily.    [name] omeprazole (PRILOSEC) 20 MG capsule Take 20 mg by mouth daily.    . solifenacin (VESICARE) 5 MG tablet Take 5 mg by mouth every morning.     . Tacrolimus 1 MG CP24 Take 4 mg by mouth 2 (two) times daily.     . traMADol (ULTRAM) 50 MG tablet Take 1 tablet (50 mg total) by mouth every 8 (eight) hours as needed. For pain. Take everyday per patient (Patient taking differently: Take 50 mg by mouth every 8 (eight) hours as needed (takes with amoxicillin). For pain. Take everyday per patient) 30 tablet 1   No current facility-administered medications on file prior to visit.     No Known Allergies  Family History  Problem Relation Age of Onset  . Heart disease Father   . Renal Disease Father   . Dementia Mother   . Diabetes Sister   . Heart disease Sister   . Diabetes Maternal Aunt   . Diabetes Maternal Uncle   . Diabetes Paternal Aunt   . Diabetes Paternal Uncle   . Heart disease Unknown   . Heart disease Brother     BP 132/80 (BP Location: Left Arm, Patient Position: Sitting, Cuff Size: Normal)   Pulse 81   Wt 262 lb 3.2 oz (118.9 kg)   SpO2 94%   BMI  37.62 kg/m    Review of Systems He denies hypoglycemia.     Objective:   Physical Exam VITAL SIGNS:  See vs page  GENERAL: no distress. Pulses: foot pulses are intact bilaterally.   MSK: no deformity of the feet or ankles.  CV: 1+ bilat edema of the legs or ankles.  Skin:  no ulcer on the feet or ankles.  normal color and temp on the feet and ankles.   Neuro: sensation is intact to touch on the feet and ankles, but decreased from normal.    Lab Results  Component Value Date   HGBA1C 9.2 04/26/2017      Assessment & Plan:  Insulin-requiring type 2 DM, with CAD: He needs a simpler regimen.    Renal failure:  as expected, glycemic control worsened with transplant.    Patient Instructions  check your blood sugar 4 times a day: before the 3 meals, and at bedtime.  also check if you have symptoms of your blood sugar being too high or too low.  please keep a record of the readings and bring it to your next appointment here (or you can bring the meter itself).  You can write it on any piece of paper.  please call us sooner if your blood sugar goes below 70, or if you have a lot of readings over 200.  Please change both insulins to humalog 75/25, 100 units with breakfast, and 50 units with supper.   Please call or message [place] next week, to tell us how the blood sugar is doing.   Please come back for a follow-up appointment in 6 weeks.

## 2017-04-26 NOTE — Telephone Encounter (Signed)
I printed  

## 2017-04-26 NOTE — Telephone Encounter (Signed)
[facility] not [facility] Humalog 75/25 [facility] their [facility].  Paula Libra the pharmacist wanted to see if we could fax over a prescription for Novlog 70/30 that she has in to give to pt  Please fax over prescription  ATTN: Dr. Rondell Reams    Fax # [phone]

## 2017-04-26 NOTE — Patient Instructions (Addendum)
check your blood sugar 4 times a day: before the 3 meals, and at bedtime.  also check if you have symptoms of your blood sugar being too high or too low.  please keep a record of the readings and bring it to your next appointment here (or you can bring the meter itself).  You can write it on any piece of paper.  please call us sooner if your blood sugar goes below 70, or if you have a lot of readings over 200.  Please change both insulins to humalog 75/25, 100 units with breakfast, and 50 units with supper.   Please call or message [place] next week, to tell us how the blood sugar is doing.   Please come back for a follow-up appointment in 6 weeks.

## 2017-04-27 NOTE — Telephone Encounter (Signed)
I have faxed.  

## 2017-04-29 ENCOUNTER — Telehealth: Payer: Self-pay | Admitting: Endocrinology

## 2017-04-29 NOTE — Telephone Encounter (Signed)
Is this okay to switch?

## 2017-04-29 NOTE — Telephone Encounter (Signed)
[facility] states insurance will NOT cover Novolog mix. They will cover HUMALOG mix. Please send script for HUMALOG mix.

## 2017-04-29 NOTE — Telephone Encounter (Signed)
Please advise 

## 2017-04-29 NOTE — Telephone Encounter (Signed)
ok 

## 2017-04-29 NOTE — Telephone Encounter (Signed)
[place] patient 

## 2017-04-30 ENCOUNTER — Ambulatory Visit: Payer: No Typology Code available for payment source | Admitting: Podiatry

## 2017-04-30 ENCOUNTER — Other Ambulatory Visit: Payer: Self-pay

## 2017-04-30 MED ORDER — INSULIN LISPRO PROT & LISPRO (75-25 MIX) 100 UNIT/ML ~~LOC~~ SUSP
SUBCUTANEOUS | 11 refills | Status: DC
Start: 2017-04-30 — End: 2017-06-16

## 2017-04-30 NOTE — Telephone Encounter (Signed)
I have sent prescription in to pharmacy for humalog 75/25

## 2017-05-19 ENCOUNTER — Ambulatory Visit (INDEPENDENT_AMBULATORY_CARE_PROVIDER_SITE_OTHER): Payer: Non-veteran care | Admitting: Podiatry

## 2017-05-19 ENCOUNTER — Encounter: Payer: Self-pay | Admitting: Podiatry

## 2017-05-19 DIAGNOSIS — E1159 Type 2 diabetes mellitus with other circulatory complications: Secondary | ICD-10-CM

## 2017-05-19 DIAGNOSIS — M79676 Pain in unspecified toe(s): Secondary | ICD-10-CM

## 2017-05-19 DIAGNOSIS — B351 Tinea unguium: Secondary | ICD-10-CM

## 2017-05-19 NOTE — Progress Notes (Signed)
Complaint:  Visit Type: Patient returns to my office for continued preventative foot care services. Complaint: Patient states" my nails have grown long and thick and become painful to walk and wear shoes" Patient has been diagnosed with DM with no foot complications. The patient presents for preventative foot care services. No changes to ROS.    Podiatric Exam: Vascular: dorsalis pedis and posterior tibial pulses are palpable bilateral. Capillary return is immediate. Temperature gradient is WNL. Skin turgor WNL  Sensorium: Normal Semmes Weinstein monofilament test. Normal tactile sensation bilaterally. Nail Exam: Pt has thick disfigured discolored nails with subungual debris noted bilateral entire nail hallux through fifth toenails.  There is blister noted subungually second toe left foot.  Sweeling at proximal nail fold second toe left foot. Ulcer Exam: There is no evidence of ulcer or pre-ulcerative changes or infection. Orthopedic Exam: Muscle tone and strength are WNL. No limitations in general ROM. No crepitus or effusions noted. Foot type and digits show no abnormalities. Bony prominences are unremarkable. Skin: No Porokeratosis. No infection or ulcers.  Healed subungual hematoma left foot.  Diagnosis:  Onychomycosis, , Pain in right toe, pain in left toes  Subungual hematoma second toe left foot.  Treatment & Plan Procedures and Treatment: Consent by patient was obtained for treatment procedures. The patient understood the discussion of treatment and procedures well. All questions were answered thoroughly reviewed. Debridement of mycotic and hypertrophic toenails, 1 through 5 bilateral and clearing of subungual debris. No ulceration, no infection noted.  Patient describes a fullness in the bottom of his feet.  I recommended he consider power step insoles to be worn in his shoes.  He says he will return to the office in a few days to purchase the power step insoles Return Visit-Office  Procedure: Patient instructed to return to the office for a follow up visit 3 months for continued evaluation and treatment.    Gardiner Barefoot DPM

## 2017-05-25 HISTORY — PX: OTHER SURGICAL HISTORY: SHX169

## 2017-05-28 DIAGNOSIS — E785 Hyperlipidemia, unspecified: Secondary | ICD-10-CM | POA: Diagnosis not present

## 2017-05-28 DIAGNOSIS — E1129 Type 2 diabetes mellitus with other diabetic kidney complication: Secondary | ICD-10-CM | POA: Diagnosis not present

## 2017-05-28 DIAGNOSIS — I129 Hypertensive chronic kidney disease with stage 1 through stage 4 chronic kidney disease, or unspecified chronic kidney disease: Secondary | ICD-10-CM | POA: Diagnosis not present

## 2017-05-28 DIAGNOSIS — M109 Gout, unspecified: Secondary | ICD-10-CM | POA: Diagnosis not present

## 2017-05-28 DIAGNOSIS — Z940 Kidney transplant status: Secondary | ICD-10-CM | POA: Diagnosis not present

## 2017-05-28 LAB — CBC AND DIFFERENTIAL
HCT: 42 (ref 41–53)
Hemoglobin: 13.8 (ref 13.5–17.5)
Neutrophils Absolute: 3
PLATELETS: 249 (ref 150–399)
WBC: 4.2

## 2017-05-28 LAB — BASIC METABOLIC PANEL
BUN: 13 (ref 4–21)
CREATININE: 1.6 — AB (ref 0.6–1.3)
POTASSIUM: 4.3 (ref 3.4–5.3)
SODIUM: 145 (ref 137–147)

## 2017-05-28 LAB — HEPATIC FUNCTION PANEL
ALT: 18 (ref 10–40)
AST: 17 (ref 14–40)
Alkaline Phosphatase: 133 — AB (ref 25–125)
Bilirubin, Total: 0.5

## 2017-06-03 ENCOUNTER — Telehealth: Payer: Self-pay

## 2017-06-03 ENCOUNTER — Telehealth: Payer: Self-pay | Admitting: Endocrinology

## 2017-06-03 ENCOUNTER — Other Ambulatory Visit: Payer: Self-pay

## 2017-06-03 MED ORDER — INSULIN ASPART PROT & ASPART (70-30 MIX) 100 UNIT/ML ~~LOC~~ SUSP: SUBCUTANEOUS | 11 refills | Status: DC

## 2017-06-03 NOTE — Telephone Encounter (Signed)
Ok I will send when patient calls back with correct fax number.

## 2017-06-03 NOTE — Telephone Encounter (Signed)
I called patient & [facility] prefers the novolog 70/30 mix. Patient is wanting to know if he can change?  He would need new prescription & he is going to call back with fax number to [place]. Please advise?

## 2017-06-03 NOTE — Telephone Encounter (Signed)
Patient want to know if Dr Loanne Drilling been contacted about changing the last insulin RX the insurance won't cover the one he currently written. please advise   Called [facility] call center

## 2017-06-03 NOTE — Telephone Encounter (Signed)
I called patient & notified him that I have faxed over new prescription.

## 2017-06-03 NOTE — Telephone Encounter (Signed)
Pt stated he spoke with you early and he was suppose to be calling to give you the fax He stated that he wanted a call back after you faxed or spoke with someone     Fax  [phone]   Direct connact to [place]   [phone]

## 2017-06-03 NOTE — Telephone Encounter (Signed)
Ok to [employer] dosage

## 2017-06-07 ENCOUNTER — Ambulatory Visit: Payer: Medicare Other | Admitting: Endocrinology

## 2017-06-07 DIAGNOSIS — Z0289 Encounter for other administrative examinations: Secondary | ICD-10-CM

## 2017-06-08 ENCOUNTER — Encounter: Payer: Self-pay | Admitting: Family Medicine

## 2017-06-08 ENCOUNTER — Telehealth: Payer: Self-pay | Admitting: Endocrinology

## 2017-06-08 NOTE — Telephone Encounter (Signed)
Patient no showed today's appt. Please advise on how to follow up. °A. No follow up necessary. °B. Follow up urgent. Contact patient immediately. °C. Follow up necessary. Contact patient and schedule visit in ___ days. °D. Follow up advised. Contact patient and schedule visit in ____weeks. ° °

## 2017-06-09 DIAGNOSIS — E041 Nontoxic single thyroid nodule: Secondary | ICD-10-CM | POA: Diagnosis not present

## 2017-06-09 DIAGNOSIS — I712 Thoracic aortic aneurysm, without rupture: Secondary | ICD-10-CM | POA: Diagnosis not present

## 2017-06-09 DIAGNOSIS — G629 Polyneuropathy, unspecified: Secondary | ICD-10-CM | POA: Diagnosis not present

## 2017-06-09 DIAGNOSIS — Z940 Kidney transplant status: Secondary | ICD-10-CM | POA: Diagnosis not present

## 2017-06-09 DIAGNOSIS — R609 Edema, unspecified: Secondary | ICD-10-CM | POA: Diagnosis not present

## 2017-06-09 NOTE — Telephone Encounter (Signed)
Please come back for a follow-up appointment in 1 month.  

## 2017-06-10 NOTE — Telephone Encounter (Signed)
I made f/u appt for patient.

## 2017-06-16 ENCOUNTER — Telehealth: Payer: Self-pay | Admitting: Endocrinology

## 2017-06-16 MED ORDER — INSULIN GLARGINE 100 UNIT/ML SOLOSTAR PEN: 150.0000 [IU] | PEN_INJECTOR | SUBCUTANEOUS | 99 refills | Status: DC

## 2017-06-16 NOTE — Telephone Encounter (Signed)
How about humalog 75/25?

## 2017-06-16 NOTE — Telephone Encounter (Signed)
novolin 70/30 or humulin 70/30 are fine.

## 2017-06-16 NOTE — Telephone Encounter (Signed)
Is it possible for patient to be switched to the NPH regular & NPH instead of the 70/30. Please advise & see note.

## 2017-06-16 NOTE — Telephone Encounter (Signed)
That's what you orginally prescribed that wasn't covered then you had switched to the 70/30.

## 2017-06-16 NOTE — Telephone Encounter (Signed)
Dr Rondell Reams from the [facility] called - . Please call  Dr Rondell Reams at [phone]. The Novalog Mix 70/30 is not on their formulary-can use MPH /regular or MPH/aspart-if these meds cannot be used she will need documentation as to why they can't be used. When calling have the call center send an instant message to Dr. Rondell Reams

## 2017-06-16 NOTE — Telephone Encounter (Signed)
Ok, in that case, let's change to lantus only, 150 units qam.  I have printed.

## 2017-06-16 NOTE — Telephone Encounter (Signed)
They need some type of proof or documentation in notes that patient needs to be on novolin 70/30 or humulin 70/30, since neither is covered under their formulary.

## 2017-06-16 NOTE — Telephone Encounter (Signed)
I have faxed over new prescription.

## 2017-06-22 ENCOUNTER — Ambulatory Visit: Payer: Medicare Other | Admitting: Endocrinology

## 2017-07-01 DIAGNOSIS — N2581 Secondary hyperparathyroidism of renal origin: Secondary | ICD-10-CM | POA: Diagnosis not present

## 2017-07-01 DIAGNOSIS — Z940 Kidney transplant status: Secondary | ICD-10-CM | POA: Diagnosis not present

## 2017-07-01 DIAGNOSIS — E785 Hyperlipidemia, unspecified: Secondary | ICD-10-CM | POA: Diagnosis not present

## 2017-07-21 ENCOUNTER — Ambulatory Visit: Payer: Medicare Other | Attending: Family Medicine | Admitting: Physical Therapy

## 2017-07-28 ENCOUNTER — Encounter: Payer: Self-pay | Admitting: Endocrinology

## 2017-07-28 ENCOUNTER — Ambulatory Visit (INDEPENDENT_AMBULATORY_CARE_PROVIDER_SITE_OTHER): Payer: Medicare Other | Admitting: Endocrinology

## 2017-07-28 VITALS — BP 128/62 | HR 96 | Wt 261.6 lb

## 2017-07-28 DIAGNOSIS — Z794 Long term (current) use of insulin: Secondary | ICD-10-CM | POA: Diagnosis not present

## 2017-07-28 DIAGNOSIS — E0843 Diabetes mellitus due to underlying condition with diabetic autonomic (poly)neuropathy: Secondary | ICD-10-CM | POA: Diagnosis not present

## 2017-07-28 LAB — POCT GLYCOSYLATED HEMOGLOBIN (HGB A1C): Hemoglobin A1C: 9.5

## 2017-07-28 MED ORDER — INSULIN GLARGINE 100 UNIT/ML SOLOSTAR PEN: 150.0000 [IU] | PEN_INJECTOR | SUBCUTANEOUS | 99 refills | Status: DC

## 2017-07-28 NOTE — Patient Instructions (Addendum)
check your blood sugar 4 times a day: before the 3 meals, and at bedtime.  also check if you have symptoms of your blood sugar being too high or too low.  please keep a record of the readings and bring it to your next appointment here (or you can bring the meter itself).  You can write it on any piece of paper.  please call us sooner if your blood sugar goes below 70, or if you have a lot of readings over 200.  Please stop taking the novolog, and increase the lantus to 150 units each morning.   Please call or message [place] next week, to tell us how the blood sugar is doing.   Please come back for a follow-up appointment in 2 months.

## 2017-07-28 NOTE — Progress Notes (Signed)
Subjective:    Patient ID: [name], male    DOB: [birth year], 67 y.o.   MRN: [id number]  HPI Pt returns for f/u of diabetes mellitus: DM type: Insulin-requiring type 2.  Dx'ed: 5053 Complications: polyneuropathy, CVA, CAD, renal failure (transplant 2017), and gastroparesis Therapy: insulin since soon after dx.  DKA: never Severe hypoglycemia: 1 episode (approx 1998) Pancreatitis: never Pancreatic imaging: normal on 2014 MRI. Other: he takes multiple daily injections; as expected, glycemic control worsened with renal transplant.   Interval history: Pt says he never misses the insulin.  no cbg record, but states cbg's are in the 200's.  It is still in general higher as the day goes on.  pt states he feels well in general.  He takes humalog, 40 units 3 times a day (just before each meal), and lantus 25 units TID.  Pt says he is having difficulty keeping up with the care of his DM, as he is so busy.     Past Medical History:  Diagnosis Date  . Allergy   . Angina    LAST HAD CP 2 MO AGO  DR [facility] [place]  . Arthritis   . Backache, unspecified   . Chest pain, unspecified   . CHF (congestive heart failure) ([place])   . Chills   . Chronic kidney disease, stage III (moderate) (H[facility])    HD T- TH-SAT  . Complication of anesthesia    01/2011 could not eat,, hospt x2, was placed on hd and cleared up  . Coronary atherosclerosis of native coronary artery   . Diabetes mellitus 2004  . Dizziness   . Dysphagia, unspecified(787.20)   . Gastroparesis   . Headache(784.0)   . Hemorrhage of rectum and anus   . Hyperlipidemia   . Hypertrophy of prostate with urinary obstruction and other lower urinary tract symptoms (LUTS)   . INTERNAL HEMORRHOIDS 10/16/2008   Qualifier: Diagnosis of  By: Nolon Rod CMA (AAMA), Robin    . Internal hemorrhoids without mention of complication   . Myocardial infarction ([place]) 2002  . Nausea alone   . Obesity   . Other dyspnea and respiratory abnormality   . Other  malaise and fatigue   . Personal history of unspecified circulatory disease   . Posttraumatic stress disorder   . Sleep apnea    USES CPAP   . Stroke [facility])    2007  . Unspecified essential hypertension    hx htn     Past Surgical History:  Procedure Laterality Date  . ANAL FISSURECTOMY    . AV FISTULA PLACEMENT    . CARDIAC CATHETERIZATION     2005 DR BRODIE  . HEMORRHOID SURGERY    . revision of fistula     renal failure  . VENTRAL HERNIA REPAIR  07/01/2011   Procedure: HERNIA REPAIR VENTRAL ADULT;  Surgeon: Joyice Faster. Cornett, MD;  Location: [place] OR;  Service: General;  Laterality: N/A;    Social History   Socioeconomic History  . Marital status: Married    Spouse name: Not on file  . Number of children: Not on file  . Years of education: Not on file  . Highest education level: Not on file  Social Needs  . Financial resource strain: Not on file  . Food insecurity - worry: Not on file  . Food insecurity - inability: Not on file  . Transportation needs - medical: Not on file  . Transportation needs - non-medical: Not on file  Occupational History  .  Occupation: disabled    Employer: DISABLED  Tobacco Use  . Smoking status: Never Smoker  . Smokeless tobacco: Never Used  Substance and Sexual Activity  . Alcohol use: No  . Drug use: No  . Sexual activity: Not Currently  Other Topics Concern  . Not on file  Social History Narrative   Married 5344. 53 year old son in 40. 1 granddaughter from [place].       Retired from [employer]. 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 ACCU-CHEK AVIVA PLUS test strip USE AS DIRECTED CHECK BLOOD SUGAR TWICE A DAY 200 each 3  . ACCU-CHEK SOFTCLIX LANCETS lancets Use three times daily 200 each 3  . acetaminophen (TYLENOL) 500 MG tablet Take  1,000 mg by mouth as needed.    [name] allopurinol (ZYLOPRIM) 100 MG tablet Take 100 mg by mouth 2 (two) times daily.     [name] amLODipine (NORVASC) 10 MG tablet Take 10 mg by mouth daily.    [name] aspirin 81 MG tablet Take 81 mg by mouth at bedtime.     [name] atorvastatin (LIPITOR) 80 MG tablet Take 40 mg by mouth daily.    . clopidogrel (PLAVIX) 75 MG tablet Take 75 mg by mouth daily.     . cyclobenzaprine (FLEXERIL) 5 MG tablet Take 0.5-1 tablets (2.5-5 mg total) 3 (three) times daily as needed by mouth for muscle spasms. 30 tablet 0  . diazepam (VALIUM) 5 MG tablet Take 1 tablet (5 mg total) by mouth at bedtime. 30 tablet 0  . furosemide (LASIX) 40 MG tablet 2 tabs two times per day.    . gabapentin (NEURONTIN) 100 MG capsule 2 in the am, 2 in the pm, and 2 if needed at night 180 capsule 3  . Magnesium 200 MG TABS Take 200 mg by mouth daily. 2 tablets after lunch    . mycophenolate (MYFORTIC) 180 MG EC tablet Take 180 mg by mouth 2 (two) times daily.    [name] omeprazole (PRILOSEC) 20 MG capsule Take 20 mg by mouth daily.    . solifenacin (VESICARE) 5 MG tablet Take 5 mg by mouth every morning.     . Tacrolimus 1 MG CP24 Take 4 mg by mouth 2 (two) times daily.     . traMADol (ULTRAM) 50 MG tablet Take 1 tablet (50 mg total) by mouth every 8 (eight) hours as needed. For pain. Take everyday per patient (Patient taking differently: Take 50 mg by mouth every 8 (eight) hours as needed (takes with amoxicillin). For pain. Take everyday per patient) 30 tablet 1   No current facility-administered medications on file prior to visit.     No Known Allergies  Family History  Problem Relation Age of Onset  . Heart disease Father   . Renal Disease Father   . Dementia Mother   . Diabetes Sister   . Heart disease Sister   . Diabetes Maternal Aunt   . Diabetes Maternal Uncle   . Diabetes Paternal Aunt   . Diabetes Paternal Uncle   . Heart disease Unknown   . Heart disease Brother     BP 128/62 (BP Location: Right  Arm, Patient Position: Sitting, Cuff Size: Normal)   Pulse 96   Wt 261 lb 9.6 oz (118.7 kg)   SpO2 97%  BMI 37.54 kg/m    Review of Systems He denies hypoglycemia.     Objective:   Physical Exam VITAL SIGNS:  See vs page.  GENERAL: no distress.  Pulses: foot pulses are intact bilaterally.   MSK: no deformity of the feet or ankles.   CV: 1+ bilat edema of the legs or ankles.   Skin:  no ulcer on the feet or ankles, but the skin is dry.  normal color and temp on the feet and ankles.   Neuro: sensation is intact to touch on the feet and ankles, but decreased from normal.    Lab Results  Component Value Date   HGBA1C 9.5 07/28/2017      Assessment & Plan:  Insulin-requiring type 2 DM, with CAD: worse   Noncompliance with cbg recording: we'll change to a simpler regimen.   Patient Instructions  check your blood sugar 4 times a day: before the 3 meals, and at bedtime.  also check if you have symptoms of your blood sugar being too high or too low.  please keep a record of the readings and bring it to your next appointment here (or you can bring the meter itself).  You can write it on any piece of paper.  please call us sooner if your blood sugar goes below 70, or if you have a lot of readings over 200.  Please stop taking the novolog, and increase the lantus to 150 units each morning.   Please call or message [place] next week, to tell us how the blood sugar is doing.   Please come back for a follow-up appointment in 2 months.

## 2017-08-05 ENCOUNTER — Other Ambulatory Visit: Payer: Self-pay

## 2017-08-05 MED ORDER — GLUCOSE BLOOD VI STRP: ORAL_STRIP | 3 refills | Status: DC

## 2017-08-17 ENCOUNTER — Ambulatory Visit (INDEPENDENT_AMBULATORY_CARE_PROVIDER_SITE_OTHER): Payer: Medicare Other | Admitting: Podiatry

## 2017-08-17 ENCOUNTER — Encounter: Payer: Self-pay | Admitting: Podiatry

## 2017-08-17 DIAGNOSIS — D689 Coagulation defect, unspecified: Secondary | ICD-10-CM

## 2017-08-17 DIAGNOSIS — M79676 Pain in unspecified toe(s): Secondary | ICD-10-CM

## 2017-08-17 DIAGNOSIS — B351 Tinea unguium: Secondary | ICD-10-CM | POA: Diagnosis not present

## 2017-08-17 DIAGNOSIS — E1159 Type 2 diabetes mellitus with other circulatory complications: Secondary | ICD-10-CM

## 2017-08-17 NOTE — Progress Notes (Signed)
Complaint:  Visit Type: Patient returns to my office for continued preventative foot care services. Complaint: Patient states" my nails have grown long and thick and become painful to walk and wear shoes" Patient has been diagnosed with DM with no foot complications. The patient presents for preventative foot care services. No changes to ROS.  Patient is taking plavix.  Podiatric Exam: Vascular: dorsalis pedis and posterior tibial pulses are palpable bilateral. Capillary return is immediate. Temperature gradient is WNL. Skin turgor WNL  Sensorium: Normal Semmes Weinstein monofilament test. Normal tactile sensation bilaterally. Nail Exam: Pt has thick disfigured discolored nails with subungual debris noted bilateral entire nail hallux through fifth toenails.  There is blister noted subungually second toe left foot.  Sweeling at proximal nail fold second toe left foot. Ulcer Exam: There is no evidence of ulcer or pre-ulcerative changes or infection. Orthopedic Exam: Muscle tone and strength are WNL. No limitations in general ROM. No crepitus or effusions noted. Foot type and digits show no abnormalities. Bony prominences are unremarkable. Skin: No Porokeratosis. No infection or ulcers.  Healed subungual hematoma left foot.  Diagnosis:  Onychomycosis, , Pain in right toe, pain in left toes    Treatment & Plan Procedures and Treatment: Consent by patient was obtained for treatment procedures. The patient understood the discussion of treatment and procedures well. All questions were answered thoroughly reviewed. Debridement of mycotic and hypertrophic toenails, 1 through 5 bilateral and clearing of subungual debris. No ulceration, no infection noted. Multiple subungual hematoma noted. Return Visit-Office Procedure: Patient instructed to return to the office for a follow up visit 3 months for continued evaluation and treatment.    Gardiner Barefoot DPM

## 2017-08-18 DIAGNOSIS — E785 Hyperlipidemia, unspecified: Secondary | ICD-10-CM | POA: Diagnosis not present

## 2017-08-18 DIAGNOSIS — I712 Thoracic aortic aneurysm, without rupture: Secondary | ICD-10-CM | POA: Diagnosis not present

## 2017-08-18 DIAGNOSIS — Z955 Presence of coronary angioplasty implant and graft: Secondary | ICD-10-CM | POA: Diagnosis not present

## 2017-08-18 DIAGNOSIS — R600 Localized edema: Secondary | ICD-10-CM | POA: Diagnosis not present

## 2017-08-18 DIAGNOSIS — Z5181 Encounter for therapeutic drug level monitoring: Secondary | ICD-10-CM | POA: Diagnosis not present

## 2017-08-18 DIAGNOSIS — B338 Other specified viral diseases: Secondary | ICD-10-CM | POA: Diagnosis not present

## 2017-08-18 DIAGNOSIS — I10 Essential (primary) hypertension: Secondary | ICD-10-CM | POA: Diagnosis not present

## 2017-08-18 DIAGNOSIS — I2510 Atherosclerotic heart disease of native coronary artery without angina pectoris: Secondary | ICD-10-CM | POA: Diagnosis not present

## 2017-08-18 DIAGNOSIS — Z79899 Other long term (current) drug therapy: Secondary | ICD-10-CM | POA: Diagnosis not present

## 2017-08-18 DIAGNOSIS — T82848A Pain from vascular prosthetic devices, implants and grafts, initial encounter: Secondary | ICD-10-CM | POA: Diagnosis not present

## 2017-08-18 DIAGNOSIS — Z4822 Encounter for aftercare following kidney transplant: Secondary | ICD-10-CM | POA: Diagnosis not present

## 2017-08-18 DIAGNOSIS — E119 Type 2 diabetes mellitus without complications: Secondary | ICD-10-CM | POA: Diagnosis not present

## 2017-08-18 DIAGNOSIS — D899 Disorder involving the immune mechanism, unspecified: Secondary | ICD-10-CM | POA: Diagnosis not present

## 2017-08-18 DIAGNOSIS — M109 Gout, unspecified: Secondary | ICD-10-CM | POA: Diagnosis not present

## 2017-08-18 DIAGNOSIS — D72819 Decreased white blood cell count, unspecified: Secondary | ICD-10-CM | POA: Diagnosis not present

## 2017-08-18 DIAGNOSIS — G629 Polyneuropathy, unspecified: Secondary | ICD-10-CM | POA: Diagnosis not present

## 2017-08-18 DIAGNOSIS — Z794 Long term (current) use of insulin: Secondary | ICD-10-CM | POA: Diagnosis not present

## 2017-08-18 DIAGNOSIS — E1165 Type 2 diabetes mellitus with hyperglycemia: Secondary | ICD-10-CM | POA: Diagnosis not present

## 2017-08-18 DIAGNOSIS — B348 Other viral infections of unspecified site: Secondary | ICD-10-CM | POA: Diagnosis not present

## 2017-08-18 DIAGNOSIS — Z792 Long term (current) use of antibiotics: Secondary | ICD-10-CM | POA: Diagnosis not present

## 2017-08-18 DIAGNOSIS — Z7952 Long term (current) use of systemic steroids: Secondary | ICD-10-CM | POA: Diagnosis not present

## 2017-08-18 DIAGNOSIS — D649 Anemia, unspecified: Secondary | ICD-10-CM | POA: Diagnosis not present

## 2017-08-18 DIAGNOSIS — Z940 Kidney transplant status: Secondary | ICD-10-CM | POA: Diagnosis not present

## 2017-09-03 DIAGNOSIS — E785 Hyperlipidemia, unspecified: Secondary | ICD-10-CM | POA: Diagnosis not present

## 2017-09-03 DIAGNOSIS — E1129 Type 2 diabetes mellitus with other diabetic kidney complication: Secondary | ICD-10-CM | POA: Diagnosis not present

## 2017-09-03 DIAGNOSIS — Z940 Kidney transplant status: Secondary | ICD-10-CM | POA: Diagnosis not present

## 2017-09-03 DIAGNOSIS — M109 Gout, unspecified: Secondary | ICD-10-CM | POA: Diagnosis not present

## 2017-09-03 DIAGNOSIS — N2581 Secondary hyperparathyroidism of renal origin: Secondary | ICD-10-CM | POA: Diagnosis not present

## 2017-09-03 DIAGNOSIS — I129 Hypertensive chronic kidney disease with stage 1 through stage 4 chronic kidney disease, or unspecified chronic kidney disease: Secondary | ICD-10-CM | POA: Diagnosis not present

## 2017-09-03 LAB — HEPATIC FUNCTION PANEL
ALT: 21 (ref 10–40)
AST: 19 (ref 14–40)
Alkaline Phosphatase: 133 — AB (ref 25–125)
Bilirubin, Total: 0.7

## 2017-09-03 LAB — BASIC METABOLIC PANEL
BUN: 23 — AB (ref 4–21)
Creatinine: 1.7 — AB (ref 0.6–1.3)
Glucose: 190
POTASSIUM: 3.6 (ref 3.4–5.3)
Sodium: 139 (ref 137–147)

## 2017-09-03 LAB — CBC AND DIFFERENTIAL
HEMATOCRIT: 42 (ref 41–53)
HEMOGLOBIN: 14.1 (ref 13.5–17.5)
PLATELETS: 244 (ref 150–399)
WBC: 3.9

## 2017-09-16 ENCOUNTER — Encounter: Payer: Self-pay | Admitting: Family Medicine

## 2017-09-16 LAB — URINALYSIS, DIPSTICK ONLY
Bilirubin: NEGATIVE
Blood: NEGATIVE
KETONE: NEGATIVE
Nitrite: NEGATIVE
Protein: NEGATIVE
SPECIFIC GRAVITY: 1.01
UUROB: 0.2
WBC: NEGATIVE
pH: 6.5

## 2017-09-17 ENCOUNTER — Ambulatory Visit: Payer: Medicare Other | Admitting: Family Medicine

## 2017-09-23 DIAGNOSIS — I129 Hypertensive chronic kidney disease with stage 1 through stage 4 chronic kidney disease, or unspecified chronic kidney disease: Secondary | ICD-10-CM | POA: Diagnosis not present

## 2017-09-23 DIAGNOSIS — Z940 Kidney transplant status: Secondary | ICD-10-CM | POA: Diagnosis not present

## 2017-09-23 DIAGNOSIS — M109 Gout, unspecified: Secondary | ICD-10-CM | POA: Diagnosis not present

## 2017-09-24 ENCOUNTER — Telehealth: Payer: Self-pay | Admitting: Endocrinology

## 2017-09-24 ENCOUNTER — Other Ambulatory Visit: Payer: Self-pay

## 2017-09-24 MED ORDER — INSULIN GLARGINE 100 UNIT/ML SOLOSTAR PEN: 150.0000 [IU] | PEN_INJECTOR | SUBCUTANEOUS | 99 refills | Status: DC

## 2017-09-24 NOTE — Telephone Encounter (Signed)
I have sent to patient;'s pharmacy.  

## 2017-09-24 NOTE — Telephone Encounter (Signed)
Patient nee a refill for    Insulin Glargine (LANTUS SOLOSTAR) 100 UNIT/ML Solostar Pen [[id number]]    Pharmacy:  [id number] - [place], [place] DEA #:  [id number]     Today if possible

## 2017-09-27 ENCOUNTER — Encounter: Payer: Self-pay | Admitting: Endocrinology

## 2017-09-27 ENCOUNTER — Ambulatory Visit (INDEPENDENT_AMBULATORY_CARE_PROVIDER_SITE_OTHER): Payer: Medicare Other | Admitting: Endocrinology

## 2017-09-27 VITALS — BP 128/78 | HR 95 | Wt 266.8 lb

## 2017-09-27 DIAGNOSIS — E0843 Diabetes mellitus due to underlying condition with diabetic autonomic (poly)neuropathy: Secondary | ICD-10-CM | POA: Diagnosis not present

## 2017-09-27 DIAGNOSIS — Z794 Long term (current) use of insulin: Secondary | ICD-10-CM

## 2017-09-27 LAB — POCT GLYCOSYLATED HEMOGLOBIN (HGB A1C): Hemoglobin A1C: 9.2

## 2017-09-27 MED ORDER — INSULIN NPH (HUMAN) (ISOPHANE) 100 UNIT/ML ~~LOC~~ SUSP: 170.0000 [IU] | SUBCUTANEOUS | 11 refills | Status: DC

## 2017-09-27 MED ORDER — FREESTYLE LIBRE 14 DAY READER DEVI: 1.0000 | Freq: Once | 0 refills | Status: AC

## 2017-09-27 MED ORDER — FREESTYLE LIBRE 14 DAY SENSOR MISC: 1.0000 | 3 refills | Status: DC

## 2017-09-27 NOTE — Patient Instructions (Addendum)
check your blood sugar 4 times a day: before the 3 meals, and at bedtime.  also check if you have symptoms of your blood sugar being too high or too low.  please keep a record of the readings and bring it to your next appointment here (or you can bring the meter itself).  You can write it on any piece of paper.  please call us sooner if your blood sugar goes below 70, or if you have a lot of readings over 200.   I have sent a prescription to your pharmacy, for the freestle libre device.  Please send [place] the blood sugar record to get this approved.   Please change the lantus to NPH, 170 units each morning.   Please call or message [place] next week, to tell us how the blood sugar is doing.   Please come back for a follow-up appointment in 2 months.

## 2017-09-27 NOTE — Progress Notes (Signed)
Subjective:    Patient ID: [name], male    DOB: [birth year], 67 y.o.   MRN: [id number]  HPI Pt returns for f/u of diabetes mellitus: DM type: Insulin-requiring type 2.  Dx'ed: 2585 Complications: polyneuropathy, CVA, CAD, renal failure (transplant 2017), and gastroparesis Therapy: insulin since soon after dx.  DKA: never Severe hypoglycemia: 1 episode (approx 1998) Pancreatitis: never Pancreatic imaging: normal on 2014 MRI. Other: he takes QD insulin, after poor results with multiple daily injections; as expected, glycemic control worsened with renal transplant.   Interval history: Pt says he never misses the insulin.  no cbg record, but states cbg's vary from 102-200's.  It is still in general higher as the day goes on.  pt states he feels well in general.   Past Medical History:  Diagnosis Date  . Allergy   . Angina    LAST HAD CP 2 MO AGO  DR WALL [facility]  . Arthritis   . Backache, unspecified   . Chest pain, unspecified   . CHF (congestive heart failure) ([place])   . Chills   . Chronic kidney disease, stage III (moderate) (HCC)    HD T- TH-SAT  . Complication of anesthesia    01/2011 could not eat,, hospt x2, was placed on hd and cleared up  . Coronary atherosclerosis of native coronary artery   . Diabetes mellitus 2004  . Dizziness   . Dysphagia, unspecified(787.20)   . Gastroparesis   . Headache(784.0)   . Hemorrhage of rectum and anus   . Hyperlipidemia   . Hypertrophy of prostate with urinary obstruction and other lower urinary tract symptoms (LUTS)   . INTERNAL HEMORRHOIDS 10/16/2008   Qualifier: Diagnosis of  By: Nolon Rod CMA (AAMA), Robin    . Internal hemorrhoids without mention of complication   . Myocardial infarction ([place]) 2002  . Nausea alone   . Obesity   . Other dyspnea and respiratory abnormality   . Other malaise and fatigue   . Personal history of unspecified circulatory disease   . Posttraumatic stress disorder   . Sleep apnea    USES CPAP    . Stroke [facility])    2007  . Unspecified essential hypertension    hx htn     Past Surgical History:  Procedure Laterality Date  . ANAL FISSURECTOMY    . AV FISTULA PLACEMENT    . CARDIAC CATHETERIZATION     2005 DR BRODIE  . HEMORRHOID SURGERY    . revision of fistula     renal failure  . VENTRAL HERNIA REPAIR  07/01/2011   Procedure: HERNIA REPAIR VENTRAL ADULT;  Surgeon: Joyice Faster. Cornett, MD;  Location: [facility];  Service: General;  Laterality: N/A;    Social History   Socioeconomic History  . Marital status: Married    Spouse name: Not on file  . Number of children: Not on file  . Years of education: Not on file  . Highest education level: Not on file  Occupational History  . Occupation: disabled    Employer: DISABLED  Social Needs  . Financial resource strain: Not on file  . Food insecurity:    Worry: Not on file    Inability: Not on file  . Transportation needs:    Medical: Not on file    Non-medical: Not on file  Tobacco Use  . Smoking status: Never Smoker  . Smokeless tobacco: Never Used  Substance and Sexual Activity  . Alcohol use: No  . Drug use:  No  . Sexual activity: Not Currently  Lifestyle  . Physical activity:    Days per week: Not on file    Minutes per session: Not on file  . Stress: Not on file  Relationships  . Social connections:    Talks on phone: Not on file    Gets together: Not on file    Attends religious service: Not on file    Active member of club or organization: Not on file    Attends meetings of clubs or organizations: Not on file    Relationship status: Not on file  . Intimate partner violence:    Fear of current or ex partner: Not on file    Emotionally abused: Not on file    Physically abused: Not on file    Forced sexual activity: Not on file  Other Topics Concern  . Not on file  Social History Narrative   Married 6235. 63 year old son in 3. 1 granddaughter from [place].       Retired from [employer]. 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 ACCU-CHEK SOFTCLIX LANCETS lancets Use three times daily 200 each 3  . acetaminophen (TYLENOL) 500 MG tablet Take 1,000 mg by mouth as needed.    [name] allopurinol (ZYLOPRIM) 100 MG tablet Take 100 mg by mouth 2 (two) times daily.     [name] amLODipine (NORVASC) 10 MG tablet Take 10 mg by mouth daily.    [name] aspirin 81 MG tablet Take 81 mg by mouth at bedtime.     [name] atorvastatin (LIPITOR) 80 MG tablet Take 40 mg by mouth daily.    . clopidogrel (PLAVIX) 75 MG tablet Take 75 mg by mouth daily.     . cyclobenzaprine (FLEXERIL) 5 MG tablet Take 0.5-1 tablets (2.5-5 mg total) 3 (three) times daily as needed by mouth for muscle spasms. 30 tablet 0  . diazepam (VALIUM) 5 MG tablet Take 1 tablet (5 mg total) by mouth at bedtime. 30 tablet 0  . furosemide (LASIX) 40 MG tablet 2 tabs two times per day.    . gabapentin (NEURONTIN) 100 MG capsule 2 in the am, 2 in the pm, and 2 if needed at night 180 capsule 3  . glucose blood (ACCU-CHEK AVIVA PLUS) test strip USE AS DIRECTED CHECK BLOOD SUGAR TWICE A DAY 200 each 3  . Magnesium 200 MG TABS Take 200 mg by mouth daily. 2 tablets after lunch    . mycophenolate (MYFORTIC) 180 MG EC tablet Take 180 mg by mouth 2 (two) times daily.    [name] omeprazole (PRILOSEC) 20 MG capsule Take 20 mg by mouth daily.    . Tacrolimus 1 MG CP24 Take 4 mg by mouth 2 (two) times daily.     . traMADol (ULTRAM) 50 MG tablet Take 1 tablet (50 mg total) by mouth every 8 (eight) hours as needed. For pain. Take everyday per patient (Patient taking differently: Take 50 mg by mouth every 8 (eight) hours as needed (takes with amoxicillin). For pain. Take everyday per patient) 30 tablet 1  . solifenacin (VESICARE) 5 MG tablet Take 5 mg by mouth every morning.      No current  facility-administered medications on file prior to visit.     No Known Allergies  Family History  Problem Relation Age of Onset  . Heart disease Father   . Renal Disease Father   . Dementia Mother   . Diabetes Sister   . Heart disease Sister   . Diabetes Maternal Aunt   . Diabetes Maternal Uncle   . Diabetes Paternal Aunt   . Diabetes Paternal Uncle   . Heart disease Unknown   . Heart disease Brother     BP 128/78   Pulse 95   Wt 266 lb 12.8 oz (121 kg)   SpO2 97%   BMI 38.28 kg/m    Review of Systems He denies hypoglycemia    Objective:   Physical Exam VITAL SIGNS:  See vs page.  GENERAL: no distress.  Pulses: foot pulses are intact bilaterally.   MSK: no deformity of the feet or ankles.   CV: trace bilat edema of the legs or ankles.   Skin:  no ulcer on the feet or ankles.  normal color and temp on the feet and ankles.   Neuro: sensation is intact to touch on the feet and ankles, but decreased from normal.    Lab Results  Component Value Date   HGBA1C 9.2 09/27/2017   Lab Results  Component Value Date   CREATININE 1.7 (A) 09/03/2017   BUN 23 (A) 09/03/2017   NA 139 09/03/2017   K 3.6 09/03/2017   CL 101 01/19/2017   CO2 26 01/19/2017       Assessment & Plan:  Insulin-requiring type 2 DM, with CVA:  Not well-controlled.  renal failure: in this setting, he needs a faster-acting qd insulin.   Patient Instructions  check your blood sugar 4 times a day: before the 3 meals, and at bedtime.  also check if you have symptoms of your blood sugar being too high or too low.  please keep a record of the readings and bring it to your next appointment here (or you can bring the meter itself).  You can write it on any piece of paper.  please call us sooner if your blood sugar goes below 70, or if you have a lot of readings over 200.   I have sent a prescription to your pharmacy, for the freestle libre device.  Please send [place] the blood sugar record to get this  approved.   Please change the lantus to NPH, 170 units each morning.   Please call or message [place] next week, to tell us how the blood sugar is doing.   Please come back for a follow-up appointment in 2 months.

## 2017-09-29 ENCOUNTER — Telehealth: Payer: Self-pay | Admitting: Endocrinology

## 2017-09-29 ENCOUNTER — Other Ambulatory Visit: Payer: Self-pay

## 2017-09-29 MED ORDER — INSULIN NPH (HUMAN) (ISOPHANE) 100 UNIT/ML ~~LOC~~ SUSP: SUBCUTANEOUS | 11 refills | Status: DC

## 2017-09-29 MED ORDER — "INSULIN SYRINGE-NEEDLE U-100 31G X 5/16"" 1 ML MISC": 11 refills | Status: DC

## 2017-09-29 NOTE — Telephone Encounter (Signed)
[facility] calling stated the insurance will not cover the Novolin, insurance will only cover the Humulin. Please advise  [phone]

## 2017-09-29 NOTE — Telephone Encounter (Signed)
insulin NPH Human (HUMULIN N) 100 UNIT/ML injection   [facility] need to speak to someone about this prescription They are not sure if this need to be vials or pens.    [facility]  [phone]

## 2017-09-29 NOTE — Telephone Encounter (Signed)
I called pharmacy & stated that prescription should be for vials. They ask that I sent separate prescription for syringes. I have sent.

## 2017-09-29 NOTE — Telephone Encounter (Signed)
I have changed to Humulin N & sent to pharmacy.

## 2017-10-06 ENCOUNTER — Other Ambulatory Visit: Payer: Self-pay

## 2017-10-15 ENCOUNTER — Encounter: Payer: Self-pay | Admitting: Family Medicine

## 2017-10-15 ENCOUNTER — Ambulatory Visit (INDEPENDENT_AMBULATORY_CARE_PROVIDER_SITE_OTHER): Payer: Medicare Other | Admitting: Family Medicine

## 2017-10-15 VITALS — BP 118/84 | HR 106 | Temp 100.0°F | Ht 70.0 in | Wt 261.4 lb

## 2017-10-15 DIAGNOSIS — R05 Cough: Secondary | ICD-10-CM | POA: Diagnosis not present

## 2017-10-15 DIAGNOSIS — R509 Fever, unspecified: Secondary | ICD-10-CM

## 2017-10-15 DIAGNOSIS — J40 Bronchitis, not specified as acute or chronic: Secondary | ICD-10-CM

## 2017-10-15 DIAGNOSIS — R059 Cough, unspecified: Secondary | ICD-10-CM

## 2017-10-15 MED ORDER — IPRATROPIUM BROMIDE 0.06 % NA SOLN: 2.0000 | Freq: Four times a day (QID) | NASAL | 0 refills | Status: DC

## 2017-10-15 MED ORDER — AZITHROMYCIN 250 MG PO TABS: ORAL_TABLET | ORAL | 0 refills | Status: DC

## 2017-10-15 MED ORDER — BENZONATATE 200 MG PO CAPS: 200.0000 mg | ORAL_CAPSULE | Freq: Two times a day (BID) | ORAL | 0 refills | Status: DC | PRN

## 2017-10-15 NOTE — Progress Notes (Signed)
    Subjective:  [name] is a 67 y.o. male who presents today for same-day appointment with a chief complaint of cough.   HPI:  Cough, acute problem Started 4 days ago.  They were that time.  Associated with fever to 107 F 3 days ago, rhinorrhea, sore throat, and sputum production.  Has tried taking Zyrtec which has not helped.  Took aspirin and Tylenol for the fever which is helped.  Associated with some shortness of breath.  Symptoms are worse at night.  No other obvious alleviating or aggravating factors.  ROS: Per HPI  PMH: He reports that he has never smoked. He has never used smokeless tobacco. He reports that he does not drink alcohol or use drugs.  Objective:  Physical Exam: BP 118/84 (BP Location: Right Arm, Patient Position: Sitting, Cuff Size: Large)   Pulse (!) 106   Temp 100 F (37.8 C) (Oral)   Ht 5\' 10"  (1.778 m)   Wt 261 lb 6.4 oz (118.6 kg)   SpO2 97%   BMI 37.51 kg/m   Gen: NAD, resting comfortably HEENT: TMs clear bilaterally.  Oropharynx erythematous without exudate.  Nasal mucosa erythematous and boggy with clear nasal discharge. CV: RRR with no murmurs appreciated Pulm: NWOB, occasional rhonchi noted throughout.  Assessment/Plan:  Cough Given his immunocompromise status related to renal transplant, reported high fevers, and lung exam findings, we will treat him for bacterial infection today with a course of azithromycin.  We will also start Atrovent for rhinorrhea/sinus congestion.  Start Tessalon for his cough. Recommended tylenol as needed for low grade fever and pain. Encouraged good oral hydration. Return precautions reviewed. Follow up as needed.   Algis Greenhouse. Jerline Pain, MD 10/15/2017 3:43 PM

## 2017-10-15 NOTE — Patient Instructions (Signed)
Start the atrovent and zpack  Start tessalon for your cough.  Please stay well hydrated.  You can take tylenol as needed for low grade fever and pain.  Please let me know if your symptoms worsen or fail to improve.  Take care, Dr Jerline Pain

## 2017-10-19 ENCOUNTER — Other Ambulatory Visit: Payer: Self-pay

## 2017-10-19 ENCOUNTER — Telehealth: Payer: Self-pay | Admitting: Family Medicine

## 2017-10-19 DIAGNOSIS — E1143 Type 2 diabetes mellitus with diabetic autonomic (poly)neuropathy: Secondary | ICD-10-CM

## 2017-10-19 NOTE — Telephone Encounter (Signed)
Please advise 

## 2017-10-19 NOTE — Telephone Encounter (Signed)
Copied from [place] [id number]. Topic: Quick Communication - See Telephone Encounter >> Oct 19, 2017 12:27 PM Aurelio Brash B wrote: CRM for notification. See Telephone encounter for: 10/19/17. PT saw Dr Jerline Pain on Friday and says he still has a bad cough,  so much so that he is sore from coughing and hasn't had any sleep,  he is asking if he can get another medication to help with cough and is asking for a call to talk to him about options [id number] [address] [place] [phone] (Phone) [phone] (Fax)

## 2017-10-19 NOTE — Telephone Encounter (Signed)
See note

## 2017-10-20 MED ORDER — GUAIFENESIN-CODEINE 100-10 MG/5ML PO SOLN: 5.0000 mL | Freq: Three times a day (TID) | ORAL | 0 refills | Status: DC | PRN

## 2017-10-20 NOTE — Telephone Encounter (Signed)
Called and spoke to patient who verbalized understanding. I did warn him that medicine can make him sleepy. I advised him to be careful driving.

## 2017-10-20 NOTE — Telephone Encounter (Signed)
Sent in rx for guaifenesin- codeine cough syrup.  Please inform patient. If symptoms worsening or not improving over the next few days, needs another office visit.   Algis Greenhouse. Jerline Pain, MD 10/20/2017 9:16 AM

## 2017-10-21 ENCOUNTER — Telehealth: Payer: Self-pay | Admitting: Family Medicine

## 2017-10-21 ENCOUNTER — Encounter: Payer: Self-pay | Admitting: Neurology

## 2017-10-21 ENCOUNTER — Telehealth: Payer: Self-pay | Admitting: Endocrinology

## 2017-10-21 DIAGNOSIS — R0989 Other specified symptoms and signs involving the circulatory and respiratory systems: Secondary | ICD-10-CM

## 2017-10-21 NOTE — Telephone Encounter (Signed)
See note

## 2017-10-21 NOTE — Telephone Encounter (Signed)
The insulin will work if you are taking enough How are your blood sugars doing?

## 2017-10-21 NOTE — Telephone Encounter (Signed)
Patient stated that his A1C when last check was a 10. He states his insulin is not working and would like a call back to discuss what he can do  Please advise

## 2017-10-21 NOTE — Telephone Encounter (Signed)
Copied from [place] [id number]. Topic: Quick Communication - See Telephone Encounter >> Oct 21, 2017  4:10 PM Percell Belt A wrote: CRM for notification. See Telephone encounter for: 10/21/17.  Monica with [facility] NP [phone] Pt PAD screening showed right foot 0.70  Left foot 0.22

## 2017-10-22 NOTE — Telephone Encounter (Signed)
Left mess for patient to call back.  

## 2017-10-23 NOTE — Telephone Encounter (Signed)
lets get a copy of his report-I may just order ABIs before we see him to help get more information first.

## 2017-10-25 ENCOUNTER — Telehealth: Payer: Self-pay | Admitting: Endocrinology

## 2017-10-25 NOTE — Telephone Encounter (Signed)
Called and spoke with [place]. Requested that she fax a copy of the report to ur office. She will fax

## 2017-10-25 NOTE — Telephone Encounter (Signed)
Patient stated that he would like to speak to someone about the freestyle libra 14 day sensor.  That he spoke with insurance and they would approve meter but have not received anything a drug store about this.  Please advise

## 2017-10-25 NOTE — Telephone Encounter (Signed)
It is not clear what study was used.  Left foot noted at severe at 0.22.  It looks like the study compares relative blood volume to seconds.   Due to possible reduced blood flow-we will get ABIs  Keith-can you please "follow" this and give me an update when schedule?

## 2017-10-26 ENCOUNTER — Other Ambulatory Visit: Payer: Self-pay

## 2017-10-26 ENCOUNTER — Telehealth: Payer: Self-pay

## 2017-10-26 ENCOUNTER — Telehealth: Payer: Self-pay | Admitting: Family Medicine

## 2017-10-26 DIAGNOSIS — R0989 Other specified symptoms and signs involving the circulatory and respiratory systems: Secondary | ICD-10-CM

## 2017-10-26 MED ORDER — FREESTYLE LIBRE 14 DAY SENSOR MISC: 1.0000 | 11 refills | Status: DC

## 2017-10-26 MED ORDER — FREESTYLE LIBRE 14 DAY READER DEVI: 1.0000 | Freq: Every day | 0 refills | Status: DC

## 2017-10-26 NOTE — Telephone Encounter (Signed)
Patient stated that he had one high blood sugar & the rest had came down to normal. He also stated that he had only ate one meal that day & knew that he needed to eat three steady meals instead.

## 2017-10-26 NOTE — Telephone Encounter (Signed)
Once the ABIs are scheduled I will let you/your team know!

## 2017-10-26 NOTE — Telephone Encounter (Signed)
I spoke with patient & I am filling out paperwork to send prescription to [facility] so [name] will be covered by his insurance.

## 2017-10-26 NOTE — Telephone Encounter (Signed)
[facility] rejected the request for freestyle libre 14 day sensor and faxed instructions to contact [phone]). [name] stated that pt does not qualify for the sensor stated that pt must test 4 times per day and inject insulin 3 times daily.

## 2017-10-26 NOTE — Telephone Encounter (Signed)
Yes, I got paper saying this.  Please continue checking cbg's.  I'll see you next time.

## 2017-10-26 NOTE — Telephone Encounter (Signed)
Copied from [place] [phone]. Topic: Quick Communication - See Telephone Encounter >> Oct 26, 2017 10:53 AM Aurelio Brash B wrote: CRM for notification. See Telephone encounter for: 10/26/17 PT states his cough is not better  he has not had sleep in 3-4 days -  side and stomach hurt from coughing , he had z pack and cough syrup,  he is finished with the zpac  but still coughing he would like to talk to the nurse

## 2017-10-27 DIAGNOSIS — I129 Hypertensive chronic kidney disease with stage 1 through stage 4 chronic kidney disease, or unspecified chronic kidney disease: Secondary | ICD-10-CM | POA: Diagnosis not present

## 2017-10-27 DIAGNOSIS — M109 Gout, unspecified: Secondary | ICD-10-CM | POA: Diagnosis not present

## 2017-10-27 DIAGNOSIS — E1129 Type 2 diabetes mellitus with other diabetic kidney complication: Secondary | ICD-10-CM | POA: Diagnosis not present

## 2017-10-27 DIAGNOSIS — Z940 Kidney transplant status: Secondary | ICD-10-CM | POA: Diagnosis not present

## 2017-10-27 DIAGNOSIS — N2581 Secondary hyperparathyroidism of renal origin: Secondary | ICD-10-CM | POA: Diagnosis not present

## 2017-10-27 LAB — BASIC METABOLIC PANEL
BUN: 20 (ref 4–21)
Creatinine: 1.8 — AB (ref 0.6–1.3)
GLUCOSE: 330
Potassium: 4.7 (ref 3.4–5.3)
SODIUM: 138 (ref 137–147)

## 2017-10-27 LAB — CBC AND DIFFERENTIAL
HCT: 42 (ref 41–53)
Hemoglobin: 13.8 (ref 13.5–17.5)
NEUTROS ABS: 3
PLATELETS: 230 (ref 150–399)
WBC: 4.6

## 2017-10-27 LAB — HEPATIC FUNCTION PANEL
ALK PHOS: 114 (ref 25–125)
ALT: 19 (ref 10–40)
AST: 21 (ref 14–40)
BILIRUBIN, TOTAL: 0.5

## 2017-10-28 ENCOUNTER — Ambulatory Visit (INDEPENDENT_AMBULATORY_CARE_PROVIDER_SITE_OTHER): Payer: Medicare Other | Admitting: Family Medicine

## 2017-10-28 ENCOUNTER — Telehealth: Payer: Self-pay | Admitting: Endocrinology

## 2017-10-28 ENCOUNTER — Encounter: Payer: Self-pay | Admitting: Family Medicine

## 2017-10-28 ENCOUNTER — Ambulatory Visit (INDEPENDENT_AMBULATORY_CARE_PROVIDER_SITE_OTHER): Payer: Medicare Other

## 2017-10-28 VITALS — BP 126/80 | HR 91 | Temp 99.2°F | Ht 70.0 in | Wt 265.6 lb

## 2017-10-28 DIAGNOSIS — R509 Fever, unspecified: Secondary | ICD-10-CM

## 2017-10-28 DIAGNOSIS — R059 Cough, unspecified: Secondary | ICD-10-CM

## 2017-10-28 DIAGNOSIS — M5442 Lumbago with sciatica, left side: Secondary | ICD-10-CM

## 2017-10-28 DIAGNOSIS — R05 Cough: Secondary | ICD-10-CM | POA: Diagnosis not present

## 2017-10-28 DIAGNOSIS — G8929 Other chronic pain: Secondary | ICD-10-CM | POA: Diagnosis not present

## 2017-10-28 MED ORDER — TRAMADOL HCL 50 MG PO TABS: 50.0000 mg | ORAL_TABLET | Freq: Three times a day (TID) | ORAL | 1 refills | Status: DC | PRN

## 2017-10-28 NOTE — Assessment & Plan Note (Addendum)
S: Constant low back pain for years.  Was told could have surgery in the past if need be.  Also has constant pain in left leg throughout he reports. Right leg with some pain. Pain all the way through due to neuropathy per his report. doenst say worse with walking. aspercreme with lidocaine helps the feet pain.   He is out of tramadol and requests refill A/P: Agreed to refill tramadol until he can get back in with the [place] in a few months.  This also may help with his cough  Please also note- getting upcoming ABIs due to insurance home exam reporting severe PAD left leg with 0.22 score but not adequately labeled as type of test- we will get more accurate picture. I have seen several inaccurate insurance reports in last few years on this issues and told patient we needed formal testing back first.

## 2017-10-28 NOTE — Patient Instructions (Addendum)
Health Maintenance Due  Topic Date Due  . Hepatitis C Screening - Patient Declined  [birth year]  . TETANUS/TDAP - Patient will go to the [place] to have it done and send [place] a copy of the results. 11/23/1967  . COLONOSCOPY - Our team will request the records from [facility] 11/23/1998  . PNA vac Low Risk Adult (1 of 2 - PCV13) - Our team will request records  11/22/2013  . OPHTHALMOLOGY EXAM - Our team will request records. 10/13/2017   See [place] back immediately for fever or shortness of breath  Your x-ray to my view does not show an obvious pneumonia but we will wait to see the radiologist opinion- you will need more antibiotics if there is a clear pneumonia.   Otherwise, this could be a bad virus that lingers for 4-6 weeks. If you are not improved in 3 weeks I would want ot see you back to recheck/reeevaluate  Try the tramadol at bedtime to see if it helps with cough

## 2017-10-28 NOTE — Telephone Encounter (Signed)
I spoke with patient & explained to him that insurance rejected his request for freestyle libre.Patient stated that he would contact insurance company again because they had initially said that they would cover device & sensors.

## 2017-10-28 NOTE — Progress Notes (Signed)
Subjective:  [name] is a 67 y.o. year old very pleasant male patient who presents for/with See problem oriented charting ROS- no fever or shortness of breath, some rib cage pain with coughing. Edema is increased but kidney doctor has already adjusted lasix to help.    Past Medical History-  Patient Active Problem List   Diagnosis Date Noted  . 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 Renal transplant recipient 06/27/2015    Priority: High  . Ascending aorta dilatation-4.7 cm per [facility] 2014 10/23/2014    Priority: High  . Diabetic neuropathy ([place]) 10/14/2010    Priority: High  . CAD -s/p DES to marginal vessel at [facility] 04/09/2009    Priority: High  . Diabetes mellitus due to underlying condition with diabetic autonomic (poly)neuropathy ([place]) 09/05/2007    Priority: High  . Gout 12/22/2009    Priority: Medium  . Obstructive sleep apnea 07/10/2009    Priority: Medium  . BPH (benign prostatic hyperplasia) 09/25/2008    Priority: Medium  . Low back pain 12/15/2007    Priority: Medium  . Posttraumatic stress disorder 11/03/2007    Priority: Medium  . Essential hypertension 01/17/2007    Priority: Medium  . CVA (cerebral infarction) 01/17/2007    Priority: Medium  . GERD (gastroesophageal reflux disease) 05/16/2014    Priority: Low  . Trigger thumb of right hand 01/03/2014    Priority: Low  . Left foot drop 05/02/2013    Priority: Low  . Risk for falls 01/22/2012    Priority: Low  . Gastroparesis 06/19/2008    Priority: Low  . Allergic rhinitis 03/01/2007    Priority: Low  . Hyperlipidemia 01/16/2016  . Orthostatic hypotension 01/15/2015  . Fall at home 01/15/2015    Medications- reviewed and updated Current Outpatient Medications  Medication Sig Dispense Refill  . ACCU-CHEK SOFTCLIX LANCETS lancets Use three times daily 200 each 3  . acetaminophen (TYLENOL) 500 MG tablet Take 1,000 mg by  mouth as needed.    [name] allopurinol (ZYLOPRIM) 100 MG tablet Take 100 mg by mouth 2 (two) times daily.     [name] amLODipine (NORVASC) 10 MG tablet Take 10 mg by mouth daily.    [name] aspirin 81 MG tablet Take 81 mg by mouth at bedtime.     [name] atorvastatin (LIPITOR) 80 MG tablet Take 40 mg by mouth daily.    . benzonatate (TESSALON) 200 MG capsule Take 1 capsule (200 mg total) by mouth 2 (two) times daily as needed for cough. 20 capsule 0  . clopidogrel (PLAVIX) 75 MG tablet Take 75 mg by mouth daily.     . Continuous Blood Gluc Receiver (FREESTYLE LIBRE 14 DAY READER) DEVI 1 Device by Does not apply route daily. 1 Device 0  . Continuous Blood Gluc Sensor (FREESTYLE LIBRE 14 DAY SENSOR) MISC 1 Device by Does not apply route every 14 (fourteen) days. 3 each 11  . cyclobenzaprine (FLEXERIL) 5 MG tablet Take 0.5-1 tablets (2.5-5 mg total) 3 (three) times daily as needed by mouth for muscle spasms. 30 tablet 0  . diazepam (VALIUM) 5 MG tablet Take 1 tablet (5 mg total) by mouth at bedtime. 30 tablet 0  . furosemide (LASIX) 40 MG tablet 2 tabs two times per day.    . gabapentin (NEURONTIN) 100 MG capsule 2 in the am, 2 in the pm, and 2 if needed at night 180 capsule 3  . glucose blood (ACCU-CHEK AVIVA  PLUS) test strip USE AS DIRECTED CHECK BLOOD SUGAR TWICE A DAY 200 each 3  . insulin NPH Human (HUMULIN N) 100 UNIT/ML injection Inject 1.7 mLs (170 units) into the skin every morning. And pen needles 2/day. 170 mL 11  . Insulin Syringe-Needle U-100 (B-D INS SYR ULTRAFINE 1CC/31G) 31G X 5/16" 1 ML MISC Used to give insulin injections twice daily. 100 each 11  . ipratropium (ATROVENT) 0.06 % nasal spray Place 2 sprays into both nostrils 4 (four) times daily. 15 mL 0  . Magnesium 200 MG TABS Take 200 mg by mouth daily. 2 tablets after lunch    . mycophenolate (MYFORTIC) 180 MG EC tablet Take 180 mg by mouth 2 (two) times daily.    [name] omeprazole (PRILOSEC) 20 MG capsule Take 20 mg by mouth daily.    . solifenacin  (VESICARE) 5 MG tablet Take 5 mg by mouth every morning.     . Tacrolimus 1 MG CP24 Take 4 mg by mouth 2 (two) times daily.     . traMADol (ULTRAM) 50 MG tablet Take 1 tablet (50 mg total) by mouth every 8 (eight) hours as needed for moderate pain or severe pain. 60 tablet 1   No current facility-administered medications for this visit.     Objective: BP 126/80 (BP Location: Left Arm, Patient Position: Sitting, Cuff Size: Normal)   Pulse 91   Temp 99.2 F (37.3 C) (Oral)   Ht 5\' 10"  (1.778 m)   Wt 265 lb 9.6 oz (120.5 kg)   SpO2 95%   BMI 38.11 kg/m  Gen: NAD, resting comfortably Tympanic membrane normal bilaterally, mild increased erythema in bilateral nares with some clear discharge, mild erythema in pharynx CV: RRR no murmurs rubs or gallops Lungs: CTAB-prior rhonchi have now cleared Abdomen: soft/nontender/obese Ext: no edema Skin: warm, dry  Assessment/Plan:  Cough - Plan: DG Chest 2 View  Fever, unspecified fever cause - Plan: DG Chest 2 View  Chronic left-sided low back pain with left-sided sciatica S: Patient was seen in late May by Dr. Raynelle Chary had 4 days of symptoms including fever up to 100.7 as well as rhinorrhea, sore throat, sputum production.  Patient was taking Zyrtec which did not help very much.  Tylenol and aspirin helped with fever.  Symptoms were worse at night.  Due to his immunocompromised status and occasional rhonchi on exam-Dr. Jerline Pain treated him aggressively with azithromycin to cover possible bacterial infection.  He also gave him Tessalon Perles and Atrovent nasal spray.  Patient also had codeine cough syrup called in- suppresses cough but he feels slowed mentally and doesn't like constipation. He states tessalon didn't help.  Lab Results  Component Value Date   HGBA1C 9.2 09/27/2017   Today, reports continues to have productive cough- this is now clear but is still thick- prior with color to it. States temp in 99s this Am but otherwise no  fever or elevated temperature A/P: cough for 3 weeks- suspect most strongly URI or viral bronchitis. Patient complains of bad back pain and asks for tramadol refill- the tramadol may actually help his cough and doesn't make him as sleepy/mentally foggy as codeine cough syrup so this is good substitution. Prednisone may raise CBGs too high so will hold off on that option for post viral cough From AVS:  " See [place] back immediately for fever or shortness of breath  Your x-ray to my view does not show an obvious pneumonia but we will wait to see the radiologist opinion-  you will need more antibiotics if there is a clear pneumonia.   Otherwise, this could be a bad virus that lingers for 4-6 weeks. If you are not improved in 3 weeks I would want ot see you back to recheck/reeevaluate  Try the tramadol at bedtime to see if it helps with cough "  Low back pain S: Constant low back pain for years.  Was told could have surgery in the past if need be.  Also has constant pain in left leg throughout he reports. Right leg with some pain. Pain all the way through due to neuropathy per his report. doenst say worse with walking. aspercreme with lidocaine helps the feet pain.   He is out of tramadol and requests refill A/P: Agreed to refill tramadol until he can get back in with the [place] in a few months.  This also may help with his cough  Please also note- getting upcoming ABIs due to insurance home exam reporting severe PAD left leg with 0.22 score but not adequately labeled as type of test- we will get more accurate picture. I have seen several inaccurate insurance reports in last few years on this issues and told patient we needed formal testing back first.     Future Appointments  Date Time Provider Reserve  11/16/2017  2:45 PM Gardiner Barefoot, DPM [facility]  12/10/2017 10:30 AM Renato Shin, MD [facility] None  01/04/2018  1:50 PM Pieter Partridge, DO [facility] None   Lab/Order  associations: Cough - Plan: DG Chest 2 View  Fever, unspecified fever cause - Plan: DG Chest 2 View  Meds ordered this encounter  Medications  . traMADol (ULTRAM) 50 MG tablet    Sig: Take 1 tablet (50 mg total) by mouth every 8 (eight) hours as needed for moderate pain or severe pain.    Dispense:  60 tablet    Refill:  1    Return precautions advised.  Garret Reddish, MD

## 2017-10-28 NOTE — Telephone Encounter (Signed)
Patient stated he was returning your call.

## 2017-10-29 ENCOUNTER — Ambulatory Visit: Payer: Medicare Other | Admitting: Family Medicine

## 2017-10-29 NOTE — Progress Notes (Signed)
No obvious pneumonia- heart is large as we discussed. This does look like a bronchitis type picture due to a virus- unfortunately there is no quick fix. As we stated- see me back if symptoms fail to improve in 3 weeks or if they worsen. Hope the tramadol is helping the cough

## 2017-11-02 ENCOUNTER — Telehealth: Payer: Self-pay | Admitting: Endocrinology

## 2017-11-02 ENCOUNTER — Telehealth (HOSPITAL_COMMUNITY): Payer: Self-pay | Admitting: Family Medicine

## 2017-11-02 NOTE — Telephone Encounter (Signed)
I called patient & he stated this was sent on mistake. This should be referred to PCP which he has been in contact with.

## 2017-11-02 NOTE — Telephone Encounter (Signed)
Per My Chart appointment request: Patient requesting appointment  To address the following health maintenance concerns: Hepatitis C screening Tetanus/Tdap Pna Vac low risk Adult Ophthalmology Exam  His PCP is Dr. Yong Channel

## 2017-11-03 NOTE — Telephone Encounter (Signed)
User: Cherie Dark A Date/time: 11/02/17 8:55 AM  Comment: Called pt and lmsg for him to CB to get sch for vascular test.   Context:  Outcome: Left Message  Phone number: [phone] Phone Type: Mobile  Comm. type: Telephone Call type: Outgoing  Contact: [name] Relation to patient: Self

## 2017-11-05 ENCOUNTER — Telehealth: Payer: Self-pay | Admitting: Endocrinology

## 2017-11-05 ENCOUNTER — Encounter: Payer: Self-pay | Admitting: Family Medicine

## 2017-11-05 NOTE — Telephone Encounter (Signed)
[facility] is calling in regards to patients freestyle libra. They stated to call the manufacture to have this sent for patient    PHONE- [phone]

## 2017-11-08 ENCOUNTER — Ambulatory Visit (HOSPITAL_COMMUNITY)
Admission: RE | Admit: 2017-11-08 | Payer: Medicare Other | Source: Ambulatory Visit | Attending: Family Medicine | Admitting: Family Medicine

## 2017-11-08 NOTE — Telephone Encounter (Signed)
I have called [place], but that are currently closed. I will try back during regular business hours.

## 2017-11-12 ENCOUNTER — Ambulatory Visit (HOSPITAL_COMMUNITY)
Admission: RE | Admit: 2017-11-12 | Discharge: 2017-11-12 | Disposition: A | Discharge status: Home / Self Care | Payer: Medicare Other | Source: Ambulatory Visit | Attending: Cardiovascular Disease | Admitting: Cardiovascular Disease

## 2017-11-12 DIAGNOSIS — R0989 Other specified symptoms and signs involving the circulatory and respiratory systems: Secondary | ICD-10-CM | POA: Diagnosis not present

## 2017-11-15 ENCOUNTER — Other Ambulatory Visit: Payer: Self-pay

## 2017-11-15 DIAGNOSIS — I739 Peripheral vascular disease, unspecified: Secondary | ICD-10-CM

## 2017-11-16 ENCOUNTER — Encounter: Payer: Self-pay | Admitting: Podiatry

## 2017-11-16 ENCOUNTER — Ambulatory Visit (INDEPENDENT_AMBULATORY_CARE_PROVIDER_SITE_OTHER): Payer: Medicare Other | Admitting: Podiatry

## 2017-11-16 ENCOUNTER — Encounter: Payer: Self-pay | Admitting: Endocrinology

## 2017-11-16 DIAGNOSIS — E1159 Type 2 diabetes mellitus with other circulatory complications: Secondary | ICD-10-CM | POA: Diagnosis not present

## 2017-11-16 DIAGNOSIS — M79676 Pain in unspecified toe(s): Secondary | ICD-10-CM

## 2017-11-16 DIAGNOSIS — B351 Tinea unguium: Secondary | ICD-10-CM

## 2017-11-16 DIAGNOSIS — D689 Coagulation defect, unspecified: Secondary | ICD-10-CM

## 2017-11-16 NOTE — Progress Notes (Signed)
Complaint:  Visit Type: Patient returns to my office for continued preventative foot care services. Complaint: Patient states" my nails have grown long and thick and become painful to walk and wear shoes" Patient has been diagnosed with DM with vascular disease.. The patient presents for preventative foot care services. No changes to ROS.  Patient is taking plavix.  Podiatric Exam: Vascular: dorsalis pedis and posterior tibial pulses are not  palpable bilateral. Capillary return is immediate. Temperature gradient is WNL. Skin turgor WNL  Sensorium: Normal Semmes Weinstein monofilament test. Normal tactile sensation bilaterally. Nail Exam: Pt has thick disfigured discolored nails with subungual debris noted bilateral entire nail hallux through fifth toenails.  There is blister noted subungually second toe left foot.  Sweeling at proximal nail fold second toe left foot. Ulcer Exam: There is no evidence of ulcer or pre-ulcerative changes or infection. Orthopedic Exam: Muscle tone and strength are WNL. No limitations in general ROM. No crepitus or effusions noted. Foot type and digits show no abnormalities. Bony prominences are unremarkable. Skin: No Porokeratosis. No infection or ulcers.    Diagnosis:  Onychomycosis, , Pain in right toe, pain in left toes    Treatment & Plan Procedures and Treatment: Consent by patient was obtained for treatment procedures. The patient understood the discussion of treatment and procedures well. All questions were answered thoroughly reviewed. Debridement of mycotic and hypertrophic toenails, 1 through 5 bilateral and clearing of subungual debris. No ulceration, no infection noted.Patient is under treatment for his circulation .  Studies revealed left foot is more diseased than right foot. Return Visit-Office Procedure: Patient instructed to return to the office for a follow up visit 3 months for continued evaluation and treatment.    Gardiner Barefoot DPM

## 2017-11-18 ENCOUNTER — Other Ambulatory Visit: Payer: Self-pay

## 2017-11-18 DIAGNOSIS — R6889 Other general symptoms and signs: Secondary | ICD-10-CM

## 2017-11-18 DIAGNOSIS — I739 Peripheral vascular disease, unspecified: Secondary | ICD-10-CM

## 2017-11-21 ENCOUNTER — Encounter (HOSPITAL_COMMUNITY): Payer: Self-pay | Admitting: Emergency Medicine

## 2017-11-21 ENCOUNTER — Observation Stay (HOSPITAL_COMMUNITY)
Admission: EM | Admit: 2017-11-21 | Discharge: 2017-11-23 | Disposition: A | Discharge status: Home / Self Care | Payer: Medicare Other | Attending: Internal Medicine | Admitting: Internal Medicine

## 2017-11-21 ENCOUNTER — Emergency Department (HOSPITAL_COMMUNITY): Payer: Medicare Other

## 2017-11-21 ENCOUNTER — Other Ambulatory Visit: Payer: Self-pay

## 2017-11-21 DIAGNOSIS — M109 Gout, unspecified: Secondary | ICD-10-CM | POA: Diagnosis not present

## 2017-11-21 DIAGNOSIS — Z833 Family history of diabetes mellitus: Secondary | ICD-10-CM | POA: Insufficient documentation

## 2017-11-21 DIAGNOSIS — E0843 Diabetes mellitus due to underlying condition with diabetic autonomic (poly)neuropathy: Secondary | ICD-10-CM | POA: Diagnosis not present

## 2017-11-21 DIAGNOSIS — Z8673 Personal history of transient ischemic attack (TIA), and cerebral infarction without residual deficits: Secondary | ICD-10-CM | POA: Insufficient documentation

## 2017-11-21 DIAGNOSIS — Z8679 Personal history of other diseases of the circulatory system: Secondary | ICD-10-CM | POA: Insufficient documentation

## 2017-11-21 DIAGNOSIS — M1990 Unspecified osteoarthritis, unspecified site: Secondary | ICD-10-CM | POA: Insufficient documentation

## 2017-11-21 DIAGNOSIS — I509 Heart failure, unspecified: Secondary | ICD-10-CM | POA: Diagnosis not present

## 2017-11-21 DIAGNOSIS — Z794 Long term (current) use of insulin: Secondary | ICD-10-CM | POA: Insufficient documentation

## 2017-11-21 DIAGNOSIS — F4310 Post-traumatic stress disorder, unspecified: Secondary | ICD-10-CM | POA: Diagnosis not present

## 2017-11-21 DIAGNOSIS — N179 Acute kidney failure, unspecified: Secondary | ICD-10-CM | POA: Diagnosis not present

## 2017-11-21 DIAGNOSIS — R079 Chest pain, unspecified: Secondary | ICD-10-CM | POA: Diagnosis present

## 2017-11-21 DIAGNOSIS — E669 Obesity, unspecified: Secondary | ICD-10-CM | POA: Insufficient documentation

## 2017-11-21 DIAGNOSIS — I130 Hypertensive heart and chronic kidney disease with heart failure and stage 1 through stage 4 chronic kidney disease, or unspecified chronic kidney disease: Secondary | ICD-10-CM | POA: Insufficient documentation

## 2017-11-21 DIAGNOSIS — Z7901 Long term (current) use of anticoagulants: Secondary | ICD-10-CM | POA: Diagnosis not present

## 2017-11-21 DIAGNOSIS — Z940 Kidney transplant status: Secondary | ICD-10-CM | POA: Diagnosis not present

## 2017-11-21 DIAGNOSIS — Z8249 Family history of ischemic heart disease and other diseases of the circulatory system: Secondary | ICD-10-CM | POA: Insufficient documentation

## 2017-11-21 DIAGNOSIS — I714 Abdominal aortic aneurysm, without rupture: Secondary | ICD-10-CM | POA: Insufficient documentation

## 2017-11-21 DIAGNOSIS — I252 Old myocardial infarction: Secondary | ICD-10-CM | POA: Insufficient documentation

## 2017-11-21 DIAGNOSIS — Z6836 Body mass index (BMI) 36.0-36.9, adult: Secondary | ICD-10-CM | POA: Insufficient documentation

## 2017-11-21 DIAGNOSIS — Z9889 Other specified postprocedural states: Secondary | ICD-10-CM | POA: Insufficient documentation

## 2017-11-21 DIAGNOSIS — N400 Enlarged prostate without lower urinary tract symptoms: Secondary | ICD-10-CM | POA: Diagnosis not present

## 2017-11-21 DIAGNOSIS — E785 Hyperlipidemia, unspecified: Secondary | ICD-10-CM | POA: Diagnosis not present

## 2017-11-21 DIAGNOSIS — I200 Unstable angina: Secondary | ICD-10-CM

## 2017-11-21 DIAGNOSIS — N183 Chronic kidney disease, stage 3 (moderate): Secondary | ICD-10-CM | POA: Insufficient documentation

## 2017-11-21 DIAGNOSIS — R0789 Other chest pain: Secondary | ICD-10-CM | POA: Diagnosis not present

## 2017-11-21 DIAGNOSIS — E1122 Type 2 diabetes mellitus with diabetic chronic kidney disease: Secondary | ICD-10-CM | POA: Diagnosis not present

## 2017-11-21 DIAGNOSIS — E1143 Type 2 diabetes mellitus with diabetic autonomic (poly)neuropathy: Secondary | ICD-10-CM | POA: Insufficient documentation

## 2017-11-21 DIAGNOSIS — Z841 Family history of disorders of kidney and ureter: Secondary | ICD-10-CM | POA: Insufficient documentation

## 2017-11-21 DIAGNOSIS — G4733 Obstructive sleep apnea (adult) (pediatric): Secondary | ICD-10-CM | POA: Diagnosis not present

## 2017-11-21 DIAGNOSIS — Z7982 Long term (current) use of aspirin: Secondary | ICD-10-CM | POA: Diagnosis not present

## 2017-11-21 DIAGNOSIS — R0602 Shortness of breath: Secondary | ICD-10-CM | POA: Diagnosis not present

## 2017-11-21 DIAGNOSIS — I77810 Thoracic aortic ectasia: Secondary | ICD-10-CM | POA: Diagnosis present

## 2017-11-21 DIAGNOSIS — R42 Dizziness and giddiness: Secondary | ICD-10-CM | POA: Diagnosis not present

## 2017-11-21 DIAGNOSIS — E1142 Type 2 diabetes mellitus with diabetic polyneuropathy: Secondary | ICD-10-CM | POA: Diagnosis not present

## 2017-11-21 DIAGNOSIS — I712 Thoracic aortic aneurysm, without rupture, unspecified: Secondary | ICD-10-CM | POA: Diagnosis present

## 2017-11-21 DIAGNOSIS — Z955 Presence of coronary angioplasty implant and graft: Secondary | ICD-10-CM | POA: Insufficient documentation

## 2017-11-21 DIAGNOSIS — K3184 Gastroparesis: Secondary | ICD-10-CM | POA: Diagnosis not present

## 2017-11-21 DIAGNOSIS — I25110 Atherosclerotic heart disease of native coronary artery with unstable angina pectoris: Secondary | ICD-10-CM | POA: Insufficient documentation

## 2017-11-21 DIAGNOSIS — Z79899 Other long term (current) drug therapy: Secondary | ICD-10-CM | POA: Insufficient documentation

## 2017-11-21 DIAGNOSIS — I2510 Atherosclerotic heart disease of native coronary artery without angina pectoris: Secondary | ICD-10-CM | POA: Diagnosis present

## 2017-11-21 LAB — COMPREHENSIVE METABOLIC PANEL
ALBUMIN: 3.1 g/dL — AB (ref 3.5–5.0)
ALK PHOS: 94 U/L (ref 38–126)
ALT: 18 U/L (ref 0–44)
AST: 25 U/L (ref 15–41)
Anion gap: 10 (ref 5–15)
BILIRUBIN TOTAL: 1 mg/dL (ref 0.3–1.2)
BUN: 11 mg/dL (ref 8–23)
CALCIUM: 9.1 mg/dL (ref 8.9–10.3)
CO2: 28 mmol/L (ref 22–32)
Chloride: 101 mmol/L (ref 98–111)
Creatinine, Ser: 1.54 mg/dL — ABNORMAL HIGH (ref 0.61–1.24)
GFR calc Af Amer: 52 mL/min — ABNORMAL LOW (ref >60)
GFR calc non Af Amer: 45 mL/min — ABNORMAL LOW (ref >60)
GLUCOSE: 125 mg/dL — AB (ref 70–99)
Potassium: 3.6 mmol/L (ref 3.5–5.1)
SODIUM: 139 mmol/L (ref 135–145)
TOTAL PROTEIN: 5.9 g/dL — AB (ref 6.5–8.1)

## 2017-11-21 LAB — CBC WITH DIFFERENTIAL/PLATELET
ABS IMMATURE GRANULOCYTES: 0 10*3/uL (ref 0.0–0.1)
BASOS ABS: 0 10*3/uL (ref 0.0–0.1)
BASOS PCT: 0 %
EOS PCT: 2 %
Eosinophils Absolute: 0.1 10*3/uL (ref 0.0–0.7)
HCT: 42.7 % (ref 39.0–52.0)
HEMOGLOBIN: 13.3 g/dL (ref 13.0–17.0)
IMMATURE GRANULOCYTES: 0 %
LYMPHS PCT: 20 %
Lymphs Abs: 0.8 10*3/uL (ref 0.7–4.0)
MCH: 26 pg (ref 26.0–34.0)
MCHC: 31.1 g/dL (ref 30.0–36.0)
MCV: 83.6 fL (ref 78.0–100.0)
MONO ABS: 0.4 10*3/uL (ref 0.1–1.0)
MONOS PCT: 10 %
Neutro Abs: 2.5 10*3/uL (ref 1.7–7.7)
Neutrophils Relative %: 66 %
PLATELETS: 211 10*3/uL (ref 150–400)
RBC: 5.11 MIL/uL (ref 4.22–5.81)
RDW: 14 % (ref 11.5–15.5)
WBC: 3.8 10*3/uL — ABNORMAL LOW (ref 4.0–10.5)

## 2017-11-21 LAB — CBG MONITORING, ED: Glucose-Capillary: 87 mg/dL (ref 70–99)

## 2017-11-21 LAB — I-STAT TROPONIN, ED: Troponin i, poc: 0.02 ng/mL (ref 0.00–0.08)

## 2017-11-21 MED ORDER — MAGNESIUM OXIDE 400 (241.3 MG) MG PO TABS
200.0000 mg | ORAL_TABLET | Freq: Every day | ORAL | Status: DC
  Administered 2017-11-22 – 2017-11-23 (×2): 200 mg via ORAL
  Filled 2017-11-21 (×2): qty 1

## 2017-11-21 MED ORDER — ATORVASTATIN CALCIUM 40 MG PO TABS
40.0000 mg | ORAL_TABLET | Freq: Every day | ORAL | Status: DC
Start: 2017-11-21 — End: 2017-11-23
  Administered 2017-11-22 – 2017-11-23 (×2): 40 mg via ORAL
  Filled 2017-11-21: qty 1
  Filled 2017-11-21: qty 2

## 2017-11-21 MED ORDER — ONDANSETRON HCL 4 MG/2ML IJ SOLN: 4.0000 mg | Freq: Four times a day (QID) | INTRAMUSCULAR | Status: DC | PRN

## 2017-11-21 MED ORDER — HEPARIN (PORCINE) IN NACL 100-0.45 UNIT/ML-% IJ SOLN
1100.0000 [IU]/h | INTRAMUSCULAR | Status: DC
  Administered 2017-11-21: 1300 [IU]/h via INTRAVENOUS
  Administered 2017-11-22: 1200 [IU]/h via INTRAVENOUS
  Filled 2017-11-21 (×3): qty 250

## 2017-11-21 MED ORDER — INSULIN ASPART 100 UNIT/ML ~~LOC~~ SOLN: 0.0000 [IU] | SUBCUTANEOUS | Status: DC

## 2017-11-21 MED ORDER — MYCOPHENOLATE SODIUM 180 MG PO TBEC
180.0000 mg | DELAYED_RELEASE_TABLET | Freq: Two times a day (BID) | ORAL | Status: DC
  Administered 2017-11-21 – 2017-11-23 (×4): 180 mg via ORAL
  Filled 2017-11-21 (×6): qty 1

## 2017-11-21 MED ORDER — DIAZEPAM 5 MG PO TABS
5.0000 mg | ORAL_TABLET | Freq: Every day | ORAL | Status: DC
  Administered 2017-11-21 – 2017-11-22 (×2): 5 mg via ORAL
  Filled 2017-11-21 (×2): qty 1

## 2017-11-21 MED ORDER — DARIFENACIN HYDROBROMIDE ER 7.5 MG PO TB24: 7.5000 mg | ORAL_TABLET | Freq: Every day | ORAL | Status: DC

## 2017-11-21 MED ORDER — GABAPENTIN 100 MG PO CAPS
200.0000 mg | ORAL_CAPSULE | Freq: Three times a day (TID) | ORAL | Status: DC
  Administered 2017-11-21: 200 mg via ORAL
  Filled 2017-11-21: qty 2

## 2017-11-21 MED ORDER — NITROGLYCERIN 0.4 MG SL SUBL
0.4000 mg | SUBLINGUAL_TABLET | SUBLINGUAL | Status: DC | PRN
  Administered 2017-11-23: 0.4 mg via SUBLINGUAL
  Filled 2017-11-21: qty 1

## 2017-11-21 MED ORDER — PANTOPRAZOLE SODIUM 40 MG PO TBEC
40.0000 mg | DELAYED_RELEASE_TABLET | Freq: Every day | ORAL | Status: DC
  Administered 2017-11-21 – 2017-11-23 (×3): 40 mg via ORAL
  Filled 2017-11-21 (×3): qty 1

## 2017-11-21 MED ORDER — TACROLIMUS 1 MG PO CAPS
4.0000 mg | ORAL_CAPSULE | Freq: Two times a day (BID) | ORAL | Status: DC
  Administered 2017-11-21 – 2017-11-23 (×4): 4 mg via ORAL
  Filled 2017-11-21 (×6): qty 4

## 2017-11-21 MED ORDER — AMLODIPINE BESYLATE 10 MG PO TABS
10.0000 mg | ORAL_TABLET | Freq: Every day | ORAL | Status: DC
  Administered 2017-11-22 – 2017-11-23 (×2): 10 mg via ORAL
  Filled 2017-11-21: qty 2
  Filled 2017-11-21: qty 1

## 2017-11-21 MED ORDER — CLOPIDOGREL BISULFATE 75 MG PO TABS
75.0000 mg | ORAL_TABLET | Freq: Every day | ORAL | Status: DC
  Administered 2017-11-22 – 2017-11-23 (×2): 75 mg via ORAL
  Filled 2017-11-21 (×2): qty 1

## 2017-11-21 MED ORDER — ASPIRIN 81 MG PO CHEW
81.0000 mg | CHEWABLE_TABLET | Freq: Every day | ORAL | Status: DC
  Administered 2017-11-22: 81 mg via ORAL
  Filled 2017-11-21: qty 1

## 2017-11-21 MED ORDER — ACETAMINOPHEN 325 MG PO TABS: 650.0000 mg | ORAL_TABLET | ORAL | Status: DC | PRN

## 2017-11-21 MED ORDER — TRAMADOL HCL 50 MG PO TABS
50.0000 mg | ORAL_TABLET | Freq: Three times a day (TID) | ORAL | Status: DC | PRN
  Administered 2017-11-23: 50 mg via ORAL
  Filled 2017-11-21 (×2): qty 1

## 2017-11-21 MED ORDER — SODIUM CHLORIDE 0.9 % IV BOLUS
500.0000 mL | Freq: Once | INTRAVENOUS | Status: AC
  Administered 2017-11-21: 500 mL via INTRAVENOUS

## 2017-11-21 MED ORDER — HEPARIN BOLUS VIA INFUSION
4000.0000 [IU] | Freq: Once | INTRAVENOUS | Status: AC
Start: 2017-11-21 — End: 2017-11-21
  Administered 2017-11-21: 4000 [IU] via INTRAVENOUS
  Filled 2017-11-21: qty 4000

## 2017-11-21 MED ORDER — ALLOPURINOL 100 MG PO TABS
100.0000 mg | ORAL_TABLET | Freq: Two times a day (BID) | ORAL | Status: DC
  Administered 2017-11-21 – 2017-11-23 (×4): 100 mg via ORAL
  Filled 2017-11-21 (×4): qty 1

## 2017-11-21 NOTE — ED Provider Notes (Signed)
[facility] EMERGENCY DEPARTMENT Provider Note   CSN: [id number] Arrival date & time: 11/21/17  1751     History   Chief Complaint Chief Complaint  Patient presents with  . Chest Pain    HPI [name] is a 67 y.o. male.  HPI   [name] is a 67 y.o. male, with a history of angina, MI, CHF, DM, HTN, and kidney transplant, presenting to the ED with chest pain beginning around 10 AM this morning. Chest pain is central chest, pressure, initially 10/10, now 7/10 following 1 NTG, radiating to both arms. States it feels similar to previous MI. Mild shortness of breath, nausea, and dizziness.  Was given 1 NTG and 324mg  ASA by EMS.  Last cardiac cath and stent was 2014 at [facility]. Cardiologist: Dr. Yancey Flemings, [facility].   Denies cough, syncope, diaphoresis, fever/chills, vomiting, diarrhea, abdominal pain, orthopnea, increased peripheral edema, or any other complaints.      Past Medical History:  Diagnosis Date  . Allergy   . Angina    LAST HAD CP 2 MO AGO  DR WALL [place]  . Arthritis   . Backache, unspecified   . Chest pain, unspecified   . CHF (congestive heart failure) ([place])   . Chills   . Chronic kidney disease, stage III (moderate) (HCC)    HD T- TH-SAT  . Complication of anesthesia    01/2011 could not eat,, hospt x2, was placed on hd and cleared up  . Coronary atherosclerosis of native coronary artery   . Diabetes mellitus 2004  . Dizziness   . Dysphagia, unspecified(787.20)   . Gastroparesis   . Headache(784.0)   . Hemorrhage of rectum and anus   . Hyperlipidemia   . Hypertrophy of prostate with urinary obstruction and other lower urinary tract symptoms (LUTS)   . INTERNAL HEMORRHOIDS 10/16/2008   Qualifier: Diagnosis of  By: Nolon Rod CMA (AAMA), Robin    . Internal hemorrhoids without mention of complication   . Myocardial infarction ([place]) 2002  . Nausea alone   . Obesity   . Other dyspnea and respiratory abnormality   . Other malaise  and fatigue   . Personal history of unspecified circulatory disease   . Posttraumatic stress disorder   . Sleep apnea    USES CPAP   . Stroke [facility])    2007  . Unspecified essential hypertension    hx htn     Patient Active Problem List   Diagnosis Date Noted  . Chest pain 11/21/2017  . Hyperlipidemia 01/16/2016  . Immunosuppressive management encounter following kidney transplant 07/12/2015  . Renal transplant recipient 06/27/2015  . Orthostatic hypotension 01/15/2015  . Fall at home 01/15/2015  . 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 GERD (gastroesophageal reflux disease) 05/16/2014  . Trigger thumb of right hand 01/03/2014  . Left foot drop 05/02/2013  . Risk for falls 01/22/2012  . Diabetic neuropathy ([place]) 10/14/2010  . Gout 12/22/2009  . Obstructive sleep apnea 07/10/2009  . CAD -s/p DES to marginal vessel at [facility] 2014 04/09/2009  . BPH (benign prostatic hyperplasia) 09/25/2008  . Gastroparesis 06/19/2008  . Low back pain 12/15/2007  . Posttraumatic stress disorder 11/03/2007  . Diabetes mellitus due to underlying condition with diabetic autonomic (poly)neuropathy ([place]) 09/05/2007  . Allergic rhinitis 03/01/2007  . Essential hypertension 01/17/2007  . CVA (cerebral infarction) 01/17/2007    Past Surgical History:  Procedure Laterality Date  . ANAL FISSURECTOMY    .  AV FISTULA PLACEMENT    . CARDIAC CATHETERIZATION     2005 DR BRODIE  . HEMORRHOID SURGERY    . revision of fistula     renal failure  . VENTRAL HERNIA REPAIR  07/01/2011   Procedure: HERNIA REPAIR VENTRAL ADULT;  Surgeon: Joyice Faster. Cornett, MD;  Location: [facility];  Service: General;  Laterality: N/A;        Home Medications    Prior to Admission medications   Medication Sig Start Date End Date Taking? Authorizing Provider  ACCU-CHEK SOFTCLIX LANCETS lancets Use three times daily 11/16/13   Ricard Dillon, MD  acetaminophen (TYLENOL) 500 MG tablet Take 1,000 mg  by mouth as needed.    [provider]  allopurinol (ZYLOPRIM) 100 MG tablet Take 100 mg by mouth 2 (two) times daily.     [provider]  amLODipine (NORVASC) 10 MG tablet Take 10 mg by mouth daily. 08/13/16   [provider]  aspirin 81 MG tablet Take 81 mg by mouth at bedtime.     [provider]  atorvastatin (LIPITOR) 80 MG tablet Take 40 mg by mouth daily.    [provider]  benzonatate (TESSALON) 200 MG capsule Take 1 capsule (200 mg total) by mouth 2 (two) times daily as needed for cough. 10/15/17   Vivi Barrack, MD  clopidogrel (PLAVIX) 75 MG tablet Take 75 mg by mouth daily.     [provider]  Continuous Blood Gluc Receiver (FREESTYLE LIBRE 14 DAY READER) DEVI 1 Device by Does not apply route daily. 10/26/17   Melrose Nakayama, MD  Continuous Blood Gluc Sensor (FREESTYLE LIBRE 14 DAY SENSOR) MISC 1 Device by Does not apply route every 14 (fourteen) days. 10/26/17   Melrose Nakayama, MD  cyclobenzaprine (FLEXERIL) 5 MG tablet Take 0.5-1 tablets (2.5-5 mg total) 3 (three) times daily as needed by mouth for muscle spasms. 04/08/17   [name], MD  diazepam (VALIUM) 5 MG tablet Take 1 tablet (5 mg total) by mouth at bedtime. 07/04/15   [name], MD  furosemide (LASIX) 40 MG tablet 2 tabs two times per day.    [provider]  gabapentin (NEURONTIN) 100 MG capsule 2 in the am, 2 in the pm, and 2 if needed at night 02/23/17   [name], MD  glucose blood (ACCU-CHEK AVIVA PLUS) test strip USE AS DIRECTED CHECK BLOOD SUGAR TWICE A DAY 08/05/17   [name], MD  insulin NPH Human (HUMULIN N) 100 UNIT/ML injection Inject 1.7 mLs (170 units) into the skin every morning. And pen needles 2/day. 09/29/17   Renato Shin, MD  Insulin Syringe-Needle U-100 (B-D INS SYR ULTRAFINE 1CC/31G) 31G X 5/16" 1 ML MISC Used to give insulin injections twice daily. 09/29/17   Renato Shin, MD  ipratropium (ATROVENT) 0.06 % nasal  spray Place 2 sprays into both nostrils 4 (four) times daily. 10/15/17   Vivi Barrack, MD  Magnesium 200 MG TABS Take 200 mg by mouth daily. 2 tablets after lunch    [provider]  mycophenolate (MYFORTIC) 180 MG EC tablet Take 180 mg by mouth 2 (two) times daily.    [provider]  omeprazole (PRILOSEC) 20 MG capsule Take 20 mg by mouth daily.    [provider]  solifenacin (VESICARE) 5 MG tablet Take 5 mg by mouth every morning.     [provider]  Tacrolimus 1 MG CP24 Take 4 mg by mouth 2 (  two) times daily.     [provider]  traMADol (ULTRAM) 50 MG tablet Take 1 tablet (50 mg total) by mouth every 8 (eight) hours as needed for moderate pain or severe pain. 10/28/17   [name], MD    Family History Family History  Problem Relation Age of Onset  . Heart disease Father   . Renal Disease Father   . Dementia Mother   . Diabetes Sister   . Heart disease Sister   . Diabetes Maternal Aunt   . Diabetes Maternal Uncle   . Diabetes Paternal Aunt   . Diabetes Paternal Uncle   . Heart disease Unknown   . Heart disease Brother     Social History Social History   Tobacco Use  . Smoking status: Never Smoker  . Smokeless tobacco: Never Used  Substance Use Topics  . Alcohol use: No  . Drug use: No     Allergies   Patient has no known allergies.   Review of Systems Review of Systems  Constitutional: Negative for chills, diaphoresis and fever.  Respiratory: Positive for shortness of breath. Negative for cough.   Cardiovascular: Positive for chest pain. Negative for leg swelling.  Gastrointestinal: Positive for nausea. Negative for abdominal pain, diarrhea and vomiting.  Neurological: Positive for dizziness. Negative for syncope, weakness, numbness and headaches.  All other systems reviewed and are negative.    Physical Exam Updated Vital Signs BP 122/64 (BP Location: Right Arm)   Pulse 69   Temp 98.2 F (36.8 C)  (Oral)   Resp 20   SpO2 96%   Physical Exam  Constitutional: He appears well-developed and well-nourished. No distress.  HENT:  Head: Normocephalic and atraumatic.  Eyes: Conjunctivae are normal.  Neck: Neck supple.  Cardiovascular: Normal rate, regular rhythm, normal heart sounds and intact distal pulses.  Pulmonary/Chest: Effort normal and breath sounds normal. No respiratory distress.  Abdominal: Soft. There is no tenderness. There is no guarding.  Musculoskeletal: He exhibits no edema.  Lymphadenopathy:    He has no cervical adenopathy.  Neurological: He is alert.  Skin: Skin is warm and dry. He is not diaphoretic.  Psychiatric: He has a normal mood and affect. His behavior is normal.  Nursing note and vitals reviewed.    ED Treatments / Results  Labs (all labs ordered are listed, but only abnormal results are displayed) Labs Reviewed  COMPREHENSIVE METABOLIC PANEL - Abnormal; Notable for the following components:      Result Value   Glucose, Bld 125 (*)    Creatinine, Ser 1.54 (*)    Total Protein 5.9 (*)    Albumin 3.1 (*)    GFR calc non Af Amer 45 (*)    GFR calc Af Amer 52 (*)    All other components within normal limits  CBC WITH DIFFERENTIAL/PLATELET - Abnormal; Notable for the following components:   WBC 3.8 (*)    All other components within normal limits  HEPARIN LEVEL (UNFRACTIONATED)  CBC  HIV ANTIBODY (ROUTINE TESTING)  TROPONIN I  TROPONIN I  TROPONIN I  MAGNESIUM  I-STAT TROPONIN, ED   BUN  Date Value Ref Range Status  11/21/2017 11 8 - 23 mg/dL Final    Comment:    Please note change in reference range.  10/27/2017 20 4 - 21 Final  09/03/2017 23 (A) 4 - 21 Final  05/28/2017 13 4 - 21 Final  03/17/2017 18 4 - 21 Final   Creatinine  Date Value Ref Range  Status  10/27/2017 1.8 (A) 0.6 - 1.3 Final  09/03/2017 1.7 (A) 0.6 - 1.3 Final  05/28/2017 1.6 (A) 0.6 - 1.3 Final  03/17/2017 1.6 (A) 0.6 - 1.3 Final   Creatinine, Ser  Date Value  Ref Range Status  11/21/2017 1.54 (H) 0.61 - 1.24 mg/dL Final  01/19/2017 1.68 (H) 0.61 - 1.24 mg/dL Final  06/06/2015 9.59 (H) 0.61 - 1.24 mg/dL Final  01/16/2015 8.37 (H) 0.61 - 1.24 mg/dL Final     EKG EKG Interpretation  Date/Time:  Sunday November 21 2017 18:08:11 EDT Ventricular Rate:  71 PR Interval:    QRS Duration: 97 QT Interval:  401 QTC Calculation: 436 R Axis:   -38 Text Interpretation:  Sinus rhythm Low voltage, extremity leads Abnormal R-wave progression, late transition Nonspecific T abnormalities, lateral leads Confirmed by Davonna Belling [phone]) on 11/21/2017 6:41:55 PM   Radiology Dg Chest 2 View  Result Date: 11/21/2017 CLINICAL DATA:  Chest pain for 1 day EXAM: CHEST - 2 VIEW COMPARISON:  10/28/2017 FINDINGS: Cardiac shadow remains enlarged. The lungs are well aerated bilaterally. No focal infiltrate or sizable effusion is seen. No bony abnormality is noted. IMPRESSION: No active cardiopulmonary disease. Electronically Signed   By: Inez Catalina M.D.   On: 11/21/2017 19:14    Procedures .Critical Care Performed by: Lorayne Bender, PA-C Authorized by: Lorayne Bender, PA-C   Critical care provider statement:    Critical care time (minutes):  35   Critical care time was exclusive of:  Separately billable procedures and treating other patients   Critical care was necessary to treat or prevent imminent or life-threatening deterioration of the following conditions: Unstable angina.   Critical care was time spent personally by me on the following activities:  Development of treatment plan with patient or surrogate, discussions with consultants, examination of patient, obtaining history from patient or surrogate, review of old charts, re-evaluation of patient's condition, pulse oximetry, ordering and review of radiographic studies, ordering and review of laboratory studies and ordering and performing treatments and interventions   I assumed direction of critical care for  this patient from another provider in my specialty: no     (including critical care time)  Medications Ordered in ED Medications  nitroGLYCERIN (NITROSTAT) SL tablet 0.4 mg (has no administration in time range)  heparin ADULT infusion 100 units/mL (25000 units/251mL sodium chloride 0.45%) (1,300 Units/hr Intravenous New Bag/Given 11/21/17 2028)  sodium chloride 0.9 % bolus 500 mL (has no administration in time range)  allopurinol (ZYLOPRIM) tablet 100 mg (has no administration in time range)  amLODipine (NORVASC) tablet 10 mg (has no administration in time range)  aspirin tablet 81 mg (has no administration in time range)  atorvastatin (LIPITOR) tablet 40 mg (has no administration in time range)  clopidogrel (PLAVIX) tablet 75 mg (has no administration in time range)  diazepam (VALIUM) tablet 5 mg (has no administration in time range)  gabapentin (NEURONTIN) capsule 200 mg (has no administration in time range)  Magnesium TABS 200 mg (has no administration in time range)  mycophenolate (MYFORTIC) EC tablet 180 mg (has no administration in time range)  pantoprazole (PROTONIX) EC tablet 40 mg (has no administration in time range)  darifenacin (ENABLEX) 24 hr tablet 7.5 mg (has no administration in time range)  Tacrolimus CP24 4 mg (has no administration in time range)  traMADol (ULTRAM) tablet 50 mg (has no administration in time range)  acetaminophen (TYLENOL) tablet 650 mg (has no administration in time range)  ondansetron (  ZOFRAN) injection 4 mg (has no administration in time range)  insulin aspart (novoLOG) injection 0-9 Units (has no administration in time range)  heparin bolus via infusion 4,000 Units (4,000 Units Intravenous Bolus from Bag 11/21/17 2028)     Initial Impression / Assessment and Plan / ED Course  I have reviewed the triage vital signs and the nursing notes.  Pertinent labs & imaging results that were available during my care of the patient were reviewed by me and  considered in my medical decision making (see chart for details).  Clinical Course as of Nov 22 2126  Sun Nov 21, 2017  1923 Spoke with Dr. Lamona Curl, cardiology fellow. Admit via medicine service, cardiology will consult.    [SJ]  1943 Spoke with Dr. Hal Hope, hospitalist. Agrees to admit the patient.    [SJ]    Clinical Course User Index [SJ] Etoy Mcdonnell C, PA-C    Patient presents with chest pain and a story concerning for unstable angina.  Nitroglycerin and heparin initiated.  Cardiology consulted.  Admitted via hospitalist.   Findings and plan of care discussed with Davonna Belling, MD.   Final Clinical Impressions(s) / ED Diagnoses   Final diagnoses:  Unstable angina [facility])    ED Discharge Orders    None       Layla Maw 11/21/17 2129    Davonna Belling, MD 11/21/17 2348

## 2017-11-21 NOTE — ED Triage Notes (Addendum)
Per EMS: pt from home with family with c/o CP that began this afternoon.  Pt denies any sob.  Pt's wife states pt is non compliant with diabetic medications.  Pt has a HX of 2 MI's and stroke.  Also states neuropathy bilaterally in lower extremities. PTA EMS administered 1 sublingual nitro with no relief.

## 2017-11-21 NOTE — H&P (Signed)
History and Physical    [name] FAO:[id number] DOB: 1950/05/28 DOA: 11/21/2017  PCP: [name], MD  Patient coming from: Home.  Chief Complaint: Chest pain.  HPI: [name] is a 67 y.o. 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  Patient states he usually wakes up in the morning at around 5 but woke up early today and started experiencing chest pain.  Pain was retrosternal pressure-like radiating to both arms and jaw.  Patient had some nausea epigastric discomfort which as per the patient is chronic.  Denies any vomiting or diarrhea or any diaphoresis or palpitations.  Shortness of breath also is chronic.  ED Course: In the ER patient's troponin was negative EKG was showing nonspecific findings and low voltage.  Given the patient's concerning symptoms cardiology on-call has been consulted and patient was started on heparin.  Patient chest pain improved after EMS gave him sublingual nitroglycerin and further improved after he had more sublingual nitroglycerin given in the ER.  Patient still has some chest pain.  Review of Systems: As per HPI, rest all negative.   Past Medical History:  Diagnosis Date  . Allergy   . Angina    LAST HAD CP 2 MO AGO  DR WALL [place]  . Arthritis   . Backache, unspecified   . Chest pain, unspecified   . CHF (congestive heart failure) ([place])   . Chills   . Chronic kidney disease, stage III (moderate) (HCC)    HD T- TH-SAT  . Complication of anesthesia    01/2011 could not eat,, hospt x2, was placed on hd and cleared up  . Coronary atherosclerosis of native coronary artery   . Diabetes mellitus 2004  . Dizziness   . Dysphagia, unspecified(787.20)   . Gastroparesis   . Headache(784.0)   . Hemorrhage of rectum and  anus   . Hyperlipidemia   . Hypertrophy of prostate with urinary obstruction and other lower urinary tract symptoms (LUTS)   . INTERNAL HEMORRHOIDS 10/16/2008   Qualifier: Diagnosis of  By: Nolon Rod CMA (AAMA), Robin    . Internal hemorrhoids without mention of complication   . Myocardial infarction ([place]) 2002  . Nausea alone   . Obesity   . Other dyspnea and respiratory abnormality   . Other malaise and fatigue   . Personal history of unspecified circulatory disease   . Posttraumatic stress disorder   . Sleep apnea    USES CPAP   . Stroke [facility])    2007  . Unspecified essential hypertension    hx htn     Past Surgical History:  Procedure Laterality Date  . ANAL FISSURECTOMY    . AV FISTULA PLACEMENT    . CARDIAC CATHETERIZATION     2005 DR BRODIE  . HEMORRHOID SURGERY    . revision of fistula     renal failure  . VENTRAL HERNIA REPAIR  07/01/2011   Procedure: HERNIA REPAIR VENTRAL ADULT;  Surgeon: Joyice Faster. Cornett, MD;  Location: [facility];  Service: General;  Laterality: N/A;     reports that he has never smoked. He has never used smokeless tobacco. He reports that he does not drink alcohol or use drugs.  No Known Allergies  Family History  Problem Relation Age of Onset  . Heart  disease Father   . Renal Disease Father   . Dementia Mother   . Diabetes Sister   . Heart disease Sister   . Diabetes Maternal Aunt   . Diabetes Maternal Uncle   . Diabetes Paternal Aunt   . Diabetes Paternal Uncle   . Heart disease Unknown   . Heart disease Brother     Prior to Admission medications   Medication Sig Start Date End Date Taking? Authorizing Provider  ACCU-CHEK SOFTCLIX LANCETS lancets Use three times daily 11/16/13   Ricard Dillon, MD  acetaminophen (TYLENOL) 500 MG tablet Take 1,000 mg by mouth as needed.    [provider]  allopurinol (ZYLOPRIM) 100 MG tablet Take 100 mg by mouth 2 (two) times daily.     [provider]  amLODipine (NORVASC) 10 MG  tablet Take 10 mg by mouth daily. 08/13/16   [provider]  aspirin 81 MG tablet Take 81 mg by mouth at bedtime.     [provider]  atorvastatin (LIPITOR) 80 MG tablet Take 40 mg by mouth daily.    [provider]  benzonatate (TESSALON) 200 MG capsule Take 1 capsule (200 mg total) by mouth 2 (two) times daily as needed for cough. 10/15/17   Vivi Barrack, MD  clopidogrel (PLAVIX) 75 MG tablet Take 75 mg by mouth daily.     [provider]  Continuous Blood Gluc Receiver (FREESTYLE LIBRE 14 DAY READER) DEVI 1 Device by Does not apply route daily. 10/26/17   Melrose Nakayama, MD  Continuous Blood Gluc Sensor (FREESTYLE LIBRE 14 DAY SENSOR) MISC 1 Device by Does not apply route every 14 (fourteen) days. 10/26/17   Melrose Nakayama, MD  cyclobenzaprine (FLEXERIL) 5 MG tablet Take 0.5-1 tablets (2.5-5 mg total) 3 (three) times daily as needed by mouth for muscle spasms. 04/08/17   [name], MD  diazepam (VALIUM) 5 MG tablet Take 1 tablet (5 mg total) by mouth at bedtime. 07/04/15   [name], MD  furosemide (LASIX) 40 MG tablet 2 tabs two times per day.    [provider]  gabapentin (NEURONTIN) 100 MG capsule 2 in the am, 2 in the pm, and 2 if needed at night 02/23/17   [name], MD  glucose blood (ACCU-CHEK AVIVA PLUS) test strip USE AS DIRECTED CHECK BLOOD SUGAR TWICE A DAY 08/05/17   [name], MD  insulin NPH Human (HUMULIN N) 100 UNIT/ML injection Inject 1.7 mLs (170 units) into the skin every morning. And pen needles 2/day. 09/29/17   Renato Shin, MD  Insulin Syringe-Needle U-100 (B-D INS SYR ULTRAFINE 1CC/31G) 31G X 5/16" 1 ML MISC Used to give insulin injections twice daily. 09/29/17   Renato Shin, MD  ipratropium (ATROVENT) 0.06 % nasal spray Place 2 sprays into both nostrils 4 (four) times daily. 10/15/17   Vivi Barrack, MD  Magnesium 200 MG TABS Take 200 mg by mouth daily. 2 tablets after lunch    [provider]  mycophenolate (MYFORTIC) 180 MG EC tablet Take 180 mg by mouth 2 (two) times daily.    [provider]  omeprazole (PRILOSEC) 20 MG capsule Take 20 mg by mouth daily.    [provider]  solifenacin (VESICARE) 5 MG tablet Take 5 mg by mouth every morning.     [provider]  Tacrolimus 1 MG CP24 Take 4 mg by mouth 2 (two) times daily.     [provider]  traMADol (ULTRAM) 50 MG tablet Take 1 tablet (50 mg total) by mouth every 8 (eight) hours as needed for moderate pain or severe pain. 10/28/17   [name], MD    Physical Exam: Vitals:   11/21/17 1930 11/21/17 1939 11/21/17 2000 11/21/17 2030  BP: 121/72 117/66 132/86 116/80  Pulse: 68 65 74 75  Resp: 10 18 10 20   Temp:  98.3 F (36.8 C)    TempSrc:  Oral    SpO2: 100% 100% 99% 99%  Weight:      Height:          Constitutional: Moderately built and nourished. Vitals:   11/21/17 1930 11/21/17 1939 11/21/17 2000 11/21/17 2030  BP: 121/72 117/66 132/86 116/80  Pulse: 68 65 74 75  Resp: 10 18 10 20   Temp:  98.3 F (36.8 C)    TempSrc:  Oral    SpO2: 100% 100% 99% 99%  Weight:      Height:       Eyes: Anicteric no pallor. ENMT: No discharge from the ears eyes nose or mouth. Neck: No mass felt.  No neck rigidity.  No JVD appreciated. Respiratory: No rhonchi or crepitations. Cardiovascular: S1-S2 heard no murmurs appreciated. Abdomen: Soft nontender bowel sounds present. Musculoskeletal: No edema.  No joint effusion. Skin: No rash. Neurologic: Alert awake oriented to time place and person.  Moves all extremities. Psychiatric: Appears normal per normal affect.   Labs on Admission: I have personally reviewed following labs and imaging studies  CBC: Recent Labs  Lab 11/21/17 1809  WBC 3.8*  NEUTROABS 2.5  HGB 13.3  HCT 42.7  MCV 83.6  PLT 742   Basic Metabolic Panel: Recent Labs  Lab 11/21/17 1809  NA 139  K 3.6  CL 101  CO2 28  GLUCOSE 125*  BUN 11    CREATININE 1.54*  CALCIUM 9.1   GFR: Estimated Creatinine Clearance: 58.5 mL/min (A) (by C-G formula based on SCr of 1.54 mg/dL (H)). Liver Function Tests: Recent Labs  Lab 11/21/17 1809  AST 25  ALT 18  ALKPHOS 94  BILITOT 1.0  PROT 5.9*  ALBUMIN 3.1*   No results for input(s): LIPASE, AMYLASE in the last 168 hours. No results for input(s): AMMONIA in the last 168 hours. Coagulation Profile: No results for input(s): INR, PROTIME in the last 168 hours. Cardiac Enzymes: No results for input(s): CKTOTAL, CKMB, CKMBINDEX, TROPONINI in the last 168 hours. BNP (last 3 results) No results for input(s): PROBNP in the last 8760 hours. HbA1C: No results for input(s): HGBA1C in the last 72 hours. CBG: No results for input(s): GLUCAP in the last 168 hours. Lipid Profile: No results for input(s): CHOL, HDL, LDLCALC, TRIG, CHOLHDL, LDLDIRECT in the last 72 hours. Thyroid Function Tests: No results for input(s): TSH, T4TOTAL, FREET4, T3FREE, THYROIDAB in the last 72 hours. Anemia Panel: No results for input(s): VITAMINB12, FOLATE, FERRITIN, TIBC, IRON, RETICCTPCT in the last 72 hours. Urine analysis:    Component Value Date/Time   COLORURINE AMBER (A) 01/15/2015 1642   APPEARANCEUR CLOUDY (A) 01/15/2015 1642   LABSPEC 1.017 01/15/2015 1642   PHURINE 6.5 09/03/2017   GLUCOSEU NEGATIVE 01/15/2015 1642   HGBUR Negative 09/03/2017   HGBUR SMALL (A) 01/15/2015 1642   BILIRUBINUR NEGATIVE 01/15/2015 1642   KETONESUR Negative 09/03/2017   KETONESUR NEGATIVE 01/15/2015 1642   PROTEINUR Negative 09/03/2017   PROTEINUR 30 (A) 01/15/2015 1642   UROBILINOGEN 0.2 01/15/2015 1642   NITRITE Negative 09/03/2017   LEUKOCYTESUR  TRACE (A) 01/15/2015 1642   Sepsis Labs: @LABRCNTIP (procalcitonin:4,lacticidven:4) )No results found for this or any previous visit (from the past 240 hour(s)).   Radiological Exams on Admission: Dg Chest 2 View  Result Date: 11/21/2017 CLINICAL DATA:  Chest pain  for 1 day EXAM: CHEST - 2 VIEW COMPARISON:  10/28/2017 FINDINGS: Cardiac shadow remains enlarged. The lungs are well aerated bilaterally. No focal infiltrate or sizable effusion is seen. No bony abnormality is noted. IMPRESSION: No active cardiopulmonary disease. Electronically Signed   By: Inez Catalina M.D.   On: 11/21/2017 19:14    EKG: Independently reviewed.  Normal sinus rhythm low voltage.  Nonspecific ST-T changes.  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 Chest pain with history of CAD status post stenting and thoracic aortic aneurysm -patient has been already started on heparin.  Continue statins aspirin and Plavix which patient is already on.  PRN sublingual nitroglycerin we will cycle cardiac markers check 2D echo.  Blood pressure as we will keep patient on beta-blockers.  Patient's creatinine is mildly elevated with a renal transplant hesitant to get a CT angiogram of the chest will give fluids and see if creatinine improves and if it does we will try to get CT angiogram of the chest.  Awaiting cardiology input. 2. Lung transplant with creatinine of 1.5 improved from baseline we will continue tacrolimus and mycophenolate.  Holding Lasix and gently hydrating now to see if creatinine improves may need CT angiogram or cardiac cath. 3. 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  Has not taken it today.  Positive follow CBGs. 4. Sleep apnea on CPAP. 5. History of gout on allopurinol.   DVT prophylaxis: Heparin. Code Status: Full code. Family Communication: Discussed with patient. Disposition Plan: Home. Consults called: Cardiology. Admission status:  Observation.   Rise Patience MD [facility] Pager [phone].  If 7PM-7AM, please contact night-coverage [email or web address] Password [facility]  11/21/2017, 8:58 PM

## 2017-11-21 NOTE — Progress Notes (Signed)
ANTICOAGULATION CONSULT NOTE - Initial Consult  Pharmacy Consult for heparin Indication: unstable angina   Patient Measurements:   Heparin Dosing Weight: 99.9 kg  Vital Signs: Temp: 98.2 F (36.8 C) (06/30 1811) Temp Source: Oral (06/30 1811) BP: 131/70 (06/30 1852) Pulse Rate: 71 (06/30 1852)  Labs: Recent Labs    11/21/17 1809  HGB 13.3  HCT 42.7  PLT 211  CREATININE 1.54*    Assessment: Admitted with chest pain. Patient has a hx of stroke and MI. Planning to start heparin for rule out ACS. SCr 1.5, cbc ok.   Goal of Therapy:  Heparin level 0.3-0.7 units/ml Monitor platelets by anticoagulation protocol: Yes    Plan:  -Heparin bolus 4000 units x1 then 1300 units/hr -Daily HL, CBC -First level with AM labs   [name] 11/21/2017,7:07 PM

## 2017-11-21 NOTE — ED Notes (Signed)
Patient transported to X-ray 

## 2017-11-22 ENCOUNTER — Other Ambulatory Visit: Payer: Self-pay

## 2017-11-22 ENCOUNTER — Observation Stay (HOSPITAL_BASED_OUTPATIENT_CLINIC_OR_DEPARTMENT_OTHER): Payer: Medicare Other

## 2017-11-22 DIAGNOSIS — E0843 Diabetes mellitus due to underlying condition with diabetic autonomic (poly)neuropathy: Secondary | ICD-10-CM | POA: Diagnosis not present

## 2017-11-22 DIAGNOSIS — G4733 Obstructive sleep apnea (adult) (pediatric): Secondary | ICD-10-CM

## 2017-11-22 DIAGNOSIS — R079 Chest pain, unspecified: Secondary | ICD-10-CM

## 2017-11-22 DIAGNOSIS — I200 Unstable angina: Secondary | ICD-10-CM | POA: Diagnosis not present

## 2017-11-22 DIAGNOSIS — Z940 Kidney transplant status: Secondary | ICD-10-CM | POA: Diagnosis not present

## 2017-11-22 DIAGNOSIS — I2510 Atherosclerotic heart disease of native coronary artery without angina pectoris: Secondary | ICD-10-CM | POA: Diagnosis not present

## 2017-11-22 DIAGNOSIS — I25110 Atherosclerotic heart disease of native coronary artery with unstable angina pectoris: Secondary | ICD-10-CM

## 2017-11-22 DIAGNOSIS — I259 Chronic ischemic heart disease, unspecified: Secondary | ICD-10-CM | POA: Diagnosis not present

## 2017-11-22 DIAGNOSIS — I25118 Atherosclerotic heart disease of native coronary artery with other forms of angina pectoris: Secondary | ICD-10-CM | POA: Diagnosis not present

## 2017-11-22 LAB — CBC
HEMATOCRIT: 46.8 % (ref 39.0–52.0)
HEMOGLOBIN: 14.2 g/dL (ref 13.0–17.0)
MCH: 26 pg (ref 26.0–34.0)
MCHC: 30.3 g/dL (ref 30.0–36.0)
MCV: 85.7 fL (ref 78.0–100.0)
Platelets: 203 10*3/uL (ref 150–400)
RBC: 5.46 MIL/uL (ref 4.22–5.81)
RDW: 14.1 % (ref 11.5–15.5)
WBC: 4.1 10*3/uL (ref 4.0–10.5)

## 2017-11-22 LAB — ECHOCARDIOGRAM COMPLETE
HEIGHTINCHES: 70 in
Weight: 4080 oz

## 2017-11-22 LAB — GLUCOSE, CAPILLARY
GLUCOSE-CAPILLARY: 120 mg/dL — AB (ref 70–99)
Glucose-Capillary: 107 mg/dL — ABNORMAL HIGH (ref 70–99)
Glucose-Capillary: 215 mg/dL — ABNORMAL HIGH (ref 70–99)

## 2017-11-22 LAB — TROPONIN I

## 2017-11-22 LAB — CBG MONITORING, ED
GLUCOSE-CAPILLARY: 118 mg/dL — AB (ref 70–99)
Glucose-Capillary: 110 mg/dL — ABNORMAL HIGH (ref 70–99)
Glucose-Capillary: 114 mg/dL — ABNORMAL HIGH (ref 70–99)

## 2017-11-22 LAB — HEPARIN LEVEL (UNFRACTIONATED)
HEPARIN UNFRACTIONATED: 0.74 [IU]/mL — AB (ref 0.30–0.70)
Heparin Unfractionated: 0.69 IU/mL (ref 0.30–0.70)
Heparin Unfractionated: 0.68 IU/mL (ref 0.30–0.70)

## 2017-11-22 LAB — MAGNESIUM: MAGNESIUM: 1.6 mg/dL — AB (ref 1.7–2.4)

## 2017-11-22 LAB — HIV ANTIBODY (ROUTINE TESTING W REFLEX): HIV Screen 4th Generation wRfx: NONREACTIVE

## 2017-11-22 LAB — MRSA PCR SCREENING: MRSA BY PCR: NEGATIVE

## 2017-11-22 MED ORDER — ACETAMINOPHEN 325 MG PO TABS: 650.0000 mg | ORAL_TABLET | ORAL | Status: DC | PRN

## 2017-11-22 MED ORDER — INSULIN ASPART 100 UNIT/ML ~~LOC~~ SOLN
0.0000 [IU] | Freq: Every day | SUBCUTANEOUS | Status: DC
  Administered 2017-11-22: 2 [IU] via SUBCUTANEOUS

## 2017-11-22 MED ORDER — MAGNESIUM SULFATE 2 GM/50ML IV SOLN
2.0000 g | Freq: Once | INTRAVENOUS | Status: AC
  Administered 2017-11-22: 2 g via INTRAVENOUS
  Filled 2017-11-22: qty 50

## 2017-11-22 MED ORDER — GABAPENTIN 100 MG PO CAPS
200.0000 mg | ORAL_CAPSULE | Freq: Two times a day (BID) | ORAL | Status: DC
  Administered 2017-11-22 – 2017-11-23 (×3): 200 mg via ORAL
  Filled 2017-11-22 (×3): qty 2

## 2017-11-22 MED ORDER — ATORVASTATIN CALCIUM 40 MG PO TABS: 40.0000 mg | ORAL_TABLET | Freq: Every day | ORAL | Status: DC

## 2017-11-22 MED ORDER — INSULIN ASPART 100 UNIT/ML ~~LOC~~ SOLN
0.0000 [IU] | Freq: Three times a day (TID) | SUBCUTANEOUS | Status: DC
  Administered 2017-11-23: 3 [IU] via SUBCUTANEOUS

## 2017-11-22 NOTE — Progress Notes (Signed)
  Echocardiogram 2D Echocardiogram has been performed.  [name] 11/22/2017, 4:17 PM

## 2017-11-22 NOTE — ED Notes (Signed)
CBG 114 

## 2017-11-22 NOTE — Progress Notes (Signed)
[place] TEAM 1 - Stepdown/ICU TEAM  MILBURN FREENEY  TZG:[id number] DOB: [birth year] DOA: 11/21/2017 PCP: [name], MD    Brief Narrative:  67yo M w/ a hx of CAD status post stenting in 2017 at [facility], thoracic aortic aneurysm, renal transplant on immunosuppressants, and DM who presented w/ substernal CP at rest w/ radiation into his jaw.    In the ER the patient's troponin was negative and EKG noted nonspecific findings.    Significant Events: 6/30 admit 7/1 TTE -   Subjective: No recurrence of cp since admit.  Denies n/v, sob, or abdom pain.    Assessment & Plan:  Chest pain - CAD status post OM1 stenting in 2014 Has ruled out for acute MI, but sx worrisome for true angina - Cards to advise on further risk stratification   Hypertension BP stable   CKD status post kidney transplant 2017 Baseline crt appears to be ~1.7 as of April & June 2019 - currently at baseline  Recent Labs  Lab 11/21/17 1809  CREATININE 1.54*    DM2 with neuropathy A1c 9.2 on 09/27/17 - CBG well controlled   Hypomagnesemia Replace and f/u   Sleep apnea On CPAP - cont home regimen   Hyperlipidemia  Obesity  DVT prophylaxis: IV heparin  Code Status: FULL CODE Family Communication: no family present at time of exam  Disposition Plan: as per Cards   Consultants:  Cardiology   Antimicrobials:  none  Objective: Blood pressure 129/60, pulse 70, temperature 98 F (36.7 C), temperature source Oral, resp. rate 12, height 5\' 10"  (1.778 m), weight 115.7 kg (255 lb), SpO2 99 %.  Intake/Output Summary (Last 24 hours) at 11/22/2017 1732 Last data filed at 11/22/2017 1500 Gross per 24 hour  Intake 36 ml  Output -  Net 36 ml   Filed Weights   11/21/17 1900 11/21/17 1917  Weight: 120 kg (264 lb 8.8 oz) 115.7 kg (255 lb)    Examination: General: No acute respiratory distress Lungs: Clear to auscultation bilaterally without wheezes or crackles Cardiovascular: Regular  rate and rhythm without murmur gallop or rub normal S1 and S2 Abdomen: Nontender, nondistended, soft, bowel sounds positive, no rebound, no ascites, no appreciable mass Extremities: No significant cyanosis, clubbing, or edema bilateral lower extremities  CBC: Recent Labs  Lab 11/21/17 1809 11/22/17 0305  WBC 3.8* 4.1  NEUTROABS 2.5  --   HGB 13.3 14.2  HCT 42.7 46.8  MCV 83.6 85.7  PLT 211 759   Basic Metabolic Panel: Recent Labs  Lab 11/21/17 1809 11/22/17 0003  NA 139  --   K 3.6  --   CL 101  --   CO2 28  --   GLUCOSE 125*  --   BUN 11  --   CREATININE 1.54*  --   CALCIUM 9.1  --   MG  --  1.6*   GFR: Estimated Creatinine Clearance: 57.7 mL/min (A) (by C-G formula based on SCr of 1.54 mg/dL (H)).  Liver Function Tests: Recent Labs  Lab 11/21/17 1809  AST 25  ALT 18  ALKPHOS 94  BILITOT 1.0  PROT 5.9*  ALBUMIN 3.1*    Cardiac Enzymes: Recent Labs  Lab 11/22/17 0003 11/22/17 0305 11/22/17 0842  TROPONINI <0.03 <0.03 <0.03    HbA1C: Hemoglobin A1C  Date/Time Value Ref Range Status  09/27/2017 11:31 AM 9.2  Final  07/28/2017 01:22 PM 9.5  Final  02/16/2017 10.2  Final   Hgb A1c MFr Bld  Date/Time Value Ref Range Status  10/23/2014 03:52 PM 7.6 (H) 4.8 - 5.6 % Final    Comment:    (NOTE)         Pre-diabetes: 5.7 - 6.4         Diabetes: >6.4         Glycemic control for adults with diabetes: <7.0   02/22/2014 6.7 (A) 4.0 - 6.0 % Final    CBG: Recent Labs  Lab 11/22/17 0007 11/22/17 0429 11/22/17 0859 11/22/17 1231 11/22/17 1654  GLUCAP 114* 110* 118* 107* 120*    Recent Results (from the past 240 hour(s))  MRSA PCR Screening     Status: None   Collection Time: 11/22/17 12:24 PM  Result Value Ref Range Status   MRSA by PCR NEGATIVE NEGATIVE Final    Comment:        The GeneXpert MRSA Assay (FDA approved for NASAL specimens only), is one component of a comprehensive MRSA colonization surveillance program. It is not intended  to diagnose MRSA infection nor to guide or monitor treatment for MRSA infections. Performed at [facility], [place] [address] [place], [place] [address]      Scheduled Meds: . allopurinol  100 mg Oral BID  . amLODipine  10 mg Oral Daily  . aspirin  81 mg Oral QHS  . atorvastatin  40 mg Oral Daily  . clopidogrel  75 mg Oral Daily  . diazepam  5 mg Oral QHS  . gabapentin  200 mg Oral Q12H  . insulin aspart  0-9 Units Subcutaneous Q4H  . magnesium oxide  200 mg Oral Daily  . mycophenolate  180 mg Oral BID  . pantoprazole  40 mg Oral Daily  . tacrolimus  4 mg Oral BID   Continuous Infusions: . heparin 1,100 Units/hr (11/22/17 1632)     LOS: 0 days   Cherene Altes, MD [facility] Office  [phone] Pager - Text Page per Amion as per below:  On-Call/Text Page:      [email or web address]      password [email or web address]  If 7PM-7AM, please contact night-coverage [email or web address] Password [facility] 11/22/2017, 5:32 PM

## 2017-11-22 NOTE — Progress Notes (Signed)
ANTICOAGULATION CONSULT NOTE - Follow Up Consult  Pharmacy Consult for heparin Indication: chest pain/ACS  Labs: Recent Labs    11/21/17 1809 11/22/17 0003 11/22/17 0305 11/22/17 0500  HGB 13.3  --  14.2  --   HCT 42.7  --  46.8  --   PLT 211  --  203  --   HEPARINUNFRC  --   --   --  0.69  CREATININE 1.54*  --   --   --   TROPONINI  --  <0.03 <0.03  --     Assessment: 67yo male therapeutic on heparin with initial dosing for possible ACS though at very upper end of goal.  Goal of Therapy:  Heparin level 0.3-0.7 units/ml   Plan:  Will decrease heparin gtt by 1 unit/kg/hr to 1200 units/hr to prevent accumulation to above goal level and check level in 6 hours.    Wynona Neat, PharmD, BCPS  11/22/2017,6:39 AM

## 2017-11-22 NOTE — Progress Notes (Signed)
[place] for heparin Indication: unstable angina   Patient Measurements: Height: 5\' 10"  (177.8 cm) Weight: 255 lb (115.7 kg) IBW/kg (Calculated) : 73 Heparin Dosing Weight: 99.9 kg  Vital Signs: Temp: 98 F (36.7 C) (07/01 1608) Temp Source: Oral (07/01 1608) BP: 129/60 (07/01 1608) Pulse Rate: 67 (07/01 1145)  Labs: Recent Labs    11/21/17 1809 11/22/17 0003 11/22/17 0305 11/22/17 0500 11/22/17 0842 11/22/17 1516  HGB 13.3  --  14.2  --   --   --   HCT 42.7  --  46.8  --   --   --   PLT 211  --  203  --   --   --   HEPARINUNFRC  --   --   --  0.69  --  0.74*  CREATININE 1.54*  --   --   --   --   --   TROPONINI  --  <0.03 <0.03  --  <0.03  --     Assessment: Admitted with chest pain. Patient has a hx of stroke and MI. Planning to start heparin for rule out ACS. Heparin level slightly high at 0.74 this afternoon. CBC stable. No bleed or IV line issues per discussion with RN.  Goal of Therapy:  Heparin level 0.3-0.7 units/ml Monitor platelets by anticoagulation protocol: Yes    Plan:  Decrease heparin to 1100 units/hr 6h heparin level Monitor daily heparin level and CBC, s/sx bleeding  Elicia Lamp, PharmD, BCPS Clinical Pharmacist 11/22/2017 4:12 PM

## 2017-11-22 NOTE — Progress Notes (Signed)
Progress Note  Patient Name: [name] Date of Encounter: 11/22/2017  Primary Cardiologist: [facility]   Subjective   Denies chest pain or palpitations today.   Inpatient Medications    Scheduled Meds: . allopurinol  100 mg Oral BID  . amLODipine  10 mg Oral Daily  . aspirin  81 mg Oral QHS  . atorvastatin  40 mg Oral Daily  . clopidogrel  75 mg Oral Daily  . diazepam  5 mg Oral QHS  . gabapentin  200 mg Oral Q12H  . insulin aspart  0-9 Units Subcutaneous Q4H  . magnesium oxide  200 mg Oral Daily  . mycophenolate  180 mg Oral BID  . pantoprazole  40 mg Oral Daily  . tacrolimus  4 mg Oral BID   Continuous Infusions: . heparin 1,200 Units/hr (11/22/17 0924)   PRN Meds: acetaminophen, nitroGLYCERIN, ondansetron (ZOFRAN) IV, traMADol   Vital Signs    Vitals:   11/22/17 0830 11/22/17 1100 11/22/17 1138 11/22/17 1145  BP: 109/63 125/69 124/66 121/75  Pulse: 69 69 65 67  Resp: 19 (!) 21 15 13   Temp:   98.1 F (36.7 C)   TempSrc:      SpO2: 98% 98% 100% 99%  Weight:      Height:       No intake or output data in the 24 hours ending 11/22/17 1212 Filed Weights   11/21/17 1900 11/21/17 1917  Weight: 264 lb 8.8 oz (120 kg) 255 lb (115.7 kg)    Physical Exam   General: Well developed, well nourished, NAD Skin: Warm, dry, intact  Head: Normocephalic, atraumatic, clear, moist mucus membranes. Neck: Negative for carotid bruits. No JVD Lungs:Clear to ausculation bilaterally. No wheezes, rales, or rhonchi. Breathing is unlabored. Cardiovascular: RRR with S1 S2. No murmurs, rubs or gallops Abdomen: Soft, non-tender, non-distended with normoactive bowel sounds.No obvious abdominal masses. MSK: Strength and tone appear normal for age. 5/5 in all extremities Extremities: No edema. No clubbing or cyanosis. DP/PT pulses 2+ bilaterally Neuro: Alert and oriented. No focal deficits. No facial asymmetry. MAE spontaneously. Psych: Responds to questions appropriately  with normal affect.    Labs    Chemistry Recent Labs  Lab 11/21/17 1809  NA 139  K 3.6  CL 101  CO2 28  GLUCOSE 125*  BUN 11  CREATININE 1.54*  CALCIUM 9.1  PROT 5.9*  ALBUMIN 3.1*  AST 25  ALT 18  ALKPHOS 94  BILITOT 1.0  GFRNONAA 45*  GFRAA 52*  ANIONGAP 10     Hematology Recent Labs  Lab 11/21/17 1809 11/22/17 0305  WBC 3.8* 4.1  RBC 5.11 5.46  HGB 13.3 14.2  HCT 42.7 46.8  MCV 83.6 85.7  MCH 26.0 26.0  MCHC 31.1 30.3  RDW 14.0 14.1  PLT 211 203    Cardiac Enzymes Recent Labs  Lab 11/22/17 0003 11/22/17 0305 11/22/17 0842  TROPONINI <0.03 <0.03 <0.03    Recent Labs  Lab 11/21/17 1814  TROPIPOC 0.02    BNPNo results for input(s): BNP, PROBNP in the last 168 hours.   DDimer No results for input(s): DDIMER in the last 168 hours.   Radiology    Dg Chest 2 View  Result Date: 11/21/2017 CLINICAL DATA:  Chest pain for 1 day EXAM: CHEST - 2 VIEW COMPARISON:  10/28/2017 FINDINGS: Cardiac shadow remains enlarged. The lungs are well aerated bilaterally. No focal infiltrate or sizable effusion is seen. No bony abnormality is noted. IMPRESSION: No active  cardiopulmonary disease. Electronically Signed   By: Inez Catalina M.D.   On: 11/21/2017 19:14    Telemetry    11/22/17 NSR HR 77 - Personally Reviewed  ECG    11/21/17 NSR with T wave inversions in lateral leads, changed from prior tracing - Personally Reviewed  Cardiac Studies   Cardiac catheterization at [facility] 2014: Cath [facility] 2014   OTHER:Cardiac catheterization was performed via right radial  arteriotomy.Coronary angiography showed a significant 75% 1st obtuse  marginal stenosis for which PCI was performed with a DES; see PCI report for  details.The RCA was a non-dominant small vessel with a proximal 95%  stenosis for which PCI was not warranted.Remainder of the Left Main, LAD  and ongoing LCx systems showed mild-to-moderate non-obstructive ASCAD as  outlined  above.Post-PCI, a TR band was placed on the right wrist for  Hemostasis.  Patient Profile     67 y.o. male with history of CAD status post stenting of OM1 in 2014, CVA, diabetes, thoracic AAA (4.7 cm), left upper extremity AVF not in use, CKD status post renal transplant 2 years prior presenting with acute onset chest pain.  Assessment & Plan    1. Chest pain with hx of CAD s/p PCI to [facility] 2014: -Pt presented with acute onset of exertional chest pain/heaviness with radiation to bilateral arms and jaw, similar to prior pain from MI in 2014. Symptoms were relieved by 4 SL NTG. No recurrence since admission. Pt reports increased stress recently. -Trop, <0.03, <0.03,<0.03 -EKG, without acute ischemic changes, diffuse T wave inversions>>new from prior tracings -Echocardiogram, pending results -Patient has remained chest pain-free since arrival -Continue ASA, Plavix, statin, amlodipine, heparin gtt -Will likely need further ischemic evaluation, however in the setting of renal transplant>>will need clear with nephrology  -Creatinine, 1.54 today, 1.8 yesterday  2. CKD with hx of renal transplant:  -Pt had recent renal transplant 2 years ago  -Creatinine, 1.54 today>>1.8 yesterday -Per primary team   3.  HTN: -Stable, 121/75>124/66>125/69 -Continue amlodipine  5.  DM2: -SSI for glucose control  -HbA1c, 9.2 on 09/27/17   SignedKathyrn Drown NP-C [facility] Pager: [phone] 11/22/2017, 12:12 PM     For questions or updates, please contact   Please consult [email or web address] for contact info under Cardiology/STEMI.

## 2017-11-22 NOTE — ED Notes (Signed)
Pt oob to bathroom with steady gait. Denies chest pain. C/o pain at lower extremities that he states is chronic issue

## 2017-11-22 NOTE — ED Notes (Signed)
Checked patient cbg it was 118 notified RN of blood sugar patient is resting with call bell in reach °

## 2017-11-22 NOTE — Consult Note (Signed)
  Reason for Consult: chest pain   Requesting Physician/Service: [facility]   PCP:  Hunter, Stephen O, MD Primary Cardiologist:[facility]  HPI:  This is a 67-year-old gentleman with history of CAD status post stenting of OM1 in 2014, CVA, diabetes, thoracic AAA (4.7 cm), left upper extremity AVF not in use, CKD status post renal transplant 2 years prior presenting with acute onset chest pain.  Over the weekend, he spent all night cooking for a church function and took a nap waking up around 11 AM with acute onset chest heaviness that went down both arms and up his jaw.  No diaphoresis, vomiting or worsening of chronic shortness of breath.  To him this reminded him of symptoms he had during prior heart attacks.  This prompted him to call emergency services, he could not find his nitroglycerin at his house and he received 3 in route to [place], ED and a fourth while in the emergency room.  After this fourth nitroglycerin his symptoms resolved.  He was started on heparin given full dose aspirin.  On interview, he notes that his symptoms have largely resolved.  Minimal if any recurrent symptoms.  He is currently resting comfortably in bed.  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  No issues with bleeding or bruising.  He is on aspirin and Plavix.  Initial troponin is normal.  Second set is in process.  Previous Cardiac Studies: Cath [facility] 2014   OTHER:Cardiac catheterization was performed via right radial  arteriotomy.Coronary angiography showed a significant 75% 1st obtuse  marginal stenosis for which PCI was performed with a DES; see PCI report for  details.The RCA was a non-dominant small vessel with a proximal 95%  stenosis for which PCI was not warranted.Remainder of the Left Main, LAD  and ongoing LCx systems showed mild-to-moderate non-obstructive ASCAD as  outlined  above.Post-PCI, a TR band was placed on the right wrist for  Hemostasis.    Past Medical History:  Diagnosis Date  . Allergy   . Angina    LAST HAD CP 2 MO AGO  DR WALL [place]  . Arthritis   . Backache, unspecified   . Chest pain, unspecified   . CHF (congestive heart failure) (HCC)   . Chills   . Chronic kidney disease, stage III (moderate) (HCC)    HD T- TH-SAT  . Complication of anesthesia    01/2011 could not eat,, hospt x2, was placed on hd and cleared up  . Coronary atherosclerosis of native coronary artery   . Diabetes mellitus 2004  . Dizziness   . Dysphagia, unspecified(787.20)   . Gastroparesis   . Headache(784.0)   . Hemorrhage of rectum and anus   . Hyperlipidemia   . Hypertrophy of prostate with urinary obstruction and other lower urinary tract symptoms (LUTS)   . INTERNAL HEMORRHOIDS 10/16/2008   Qualifier: Diagnosis of  By: Stallings CMA (AAMA), Robin    . Internal hemorrhoids without mention of complication   . Myocardial infarction (HCC) 2002  . Nausea alone   . Obesity   . Other dyspnea and respiratory abnormality   . Other malaise and fatigue   . Personal history of unspecified circulatory disease   . Posttraumatic stress disorder   . Sleep apnea    USES CPAP   . Stroke (HCC)    2007  . Unspecified essential hypertension    hx htn       Past Surgical History:  Procedure Laterality Date  . ANAL FISSURECTOMY    . AV FISTULA PLACEMENT    . CARDIAC CATHETERIZATION     2005 DR BRODIE  . HEMORRHOID SURGERY    . revision of fistula     renal failure  . VENTRAL HERNIA REPAIR  07/01/2011   Procedure: HERNIA REPAIR VENTRAL ADULT;  Surgeon: Joyice Faster. Cornett, MD;  Location: [place] OR;  Service: General;  Laterality: N/A;    Family History  Problem Relation Age of Onset  . Heart disease Father   . Renal Disease Father   . Dementia Mother   . Diabetes Sister   . Heart disease Sister   . Diabetes Maternal Aunt   . Diabetes Maternal Uncle   . Diabetes  Paternal Aunt   . Diabetes Paternal Uncle   . Heart disease Unknown   . Heart disease Brother    Social History:  reports that he has never smoked. He has never used smokeless tobacco. He reports that he does not drink alcohol or use drugs.  Allergies: No Known Allergies  No current facility-administered medications on file prior to encounter.    Current Outpatient Medications on File Prior to Encounter  Medication Sig Dispense Refill  . acetaminophen (TYLENOL) 500 MG tablet Take 1,000 mg by mouth as needed for mild pain or headache.     . allopurinol (ZYLOPRIM) 100 MG tablet Take 100 mg by mouth 2 (two) times daily.     [name] amLODipine (NORVASC) 10 MG tablet Take 10 mg by mouth daily.    [name] aspirin 81 MG tablet Take 81 mg by mouth at bedtime.     [name] atorvastatin (LIPITOR) 40 MG tablet Take 40 mg by mouth daily.    . clopidogrel (PLAVIX) 75 MG tablet Take 75 mg by mouth daily.     . cyclobenzaprine (FLEXERIL) 5 MG tablet Take 0.5-1 tablets (2.5-5 mg total) 3 (three) times daily as needed by mouth for muscle spasms. 30 tablet 0  . diazepam (VALIUM) 5 MG tablet Take 1 tablet (5 mg total) by mouth at bedtime. 30 tablet 0  . furosemide (LASIX) 40 MG tablet Take 80 mg by mouth 2 (two) times daily.     [name] gabapentin (NEURONTIN) 100 MG capsule 2 in the am, 2 in the pm, and 2 if needed at night (Patient taking differently: Take 200 mg by mouth See admin instructions. Take 200 mg by mouth in the morning and 200 mg at bedtime SCHEDULED and may take an additional 200 mg during the day AS NEEDED for nerve pain) 180 capsule 3  . insulin NPH Human (HUMULIN N) 100 UNIT/ML injection Inject 1.7 mLs (170 units) into the skin every morning. And pen needles 2/day. (Patient taking differently: Inject 180 Units into the skin daily before breakfast. ) 170 mL 11  . Magnesium 200 MG TABS Take 200 mg by mouth daily with lunch.     . mycophenolate (MYFORTIC) 180 MG EC tablet Take 180 mg by mouth 2 (two) times daily.    [name]  omeprazole (PRILOSEC) 20 MG capsule Take 20 mg by mouth daily.    . Tacrolimus 1 MG CP24 Take 4 mg by mouth 2 (two) times daily.     . traMADol (ULTRAM) 50 MG tablet Take 1 tablet (50 mg total) by mouth every 8 (eight) hours as needed for moderate pain or severe pain. (Patient taking differently: Take 50 mg by mouth See admin instructions. Take 50 mg by mouth at  bedtime and 50 mg two times a day as needed for pain) 60 tablet 1  . ACCU-CHEK SOFTCLIX LANCETS lancets Use three times daily 200 each 3  . Continuous Blood Gluc Receiver (FREESTYLE LIBRE 14 DAY READER) DEVI 1 Device by Does not apply route daily. 1 Device 0  . Continuous Blood Gluc Sensor (FREESTYLE LIBRE 14 DAY SENSOR) MISC 1 Device by Does not apply route every 14 (fourteen) days. 3 each 11  . glucose blood (ACCU-CHEK AVIVA PLUS) test strip USE AS DIRECTED CHECK BLOOD SUGAR TWICE A DAY 200 each 3  . Insulin Syringe-Needle U-100 (B-D INS SYR ULTRAFINE 1CC/31G) 31G X 5/16" 1 ML MISC Used to give insulin injections twice daily. 100 each 11    @medshecduled@ @medinfusions@  Results for orders placed or performed during the hospital encounter of 11/21/17 (from the past 48 hour(s))  Comprehensive metabolic panel     Status: Abnormal   Collection Time: 11/21/17  6:09 PM  Result Value Ref Range   Sodium 139 135 - 145 mmol/L   Potassium 3.6 3.5 - 5.1 mmol/L   Chloride 101 98 - 111 mmol/L    Comment: Please note change in reference range.   CO2 28 22 - 32 mmol/L   Glucose, Bld 125 (H) 70 - 99 mg/dL    Comment: Please note change in reference range.   BUN 11 8 - 23 mg/dL    Comment: Please note change in reference range.   Creatinine, Ser 1.54 (H) 0.61 - 1.24 mg/dL   Calcium 9.1 8.9 - 10.3 mg/dL   Total Protein 5.9 (L) 6.5 - 8.1 g/dL   Albumin 3.1 (L) 3.5 - 5.0 g/dL   AST 25 15 - 41 U/L   ALT 18 0 - 44 U/L    Comment: Please note change in reference range.   Alkaline Phosphatase 94 38 - 126 U/L   Total Bilirubin 1.0 0.3 - 1.2 mg/dL     GFR calc non Af Amer 45 (L) >60 mL/min   GFR calc Af Amer 52 (L) >60 mL/min    Comment: (NOTE) The eGFR has been calculated using the CKD EPI equation. This calculation has not been validated in all clinical situations. eGFR's persistently <60 mL/min signify possible Chronic Kidney Disease.    Anion gap 10 5 - 15    Comment: Performed at [facility], [address] [place], [place] [address]  CBC with Differential     Status: Abnormal   Collection Time: 11/21/17  6:09 PM  Result Value Ref Range   WBC 3.8 (L) 4.0 - 10.5 K/uL   RBC 5.11 4.22 - 5.81 MIL/uL   Hemoglobin 13.3 13.0 - 17.0 g/dL   HCT 42.7 39.0 - 52.0 %   MCV 83.6 78.0 - 100.0 fL   MCH 26.0 26.0 - 34.0 pg   MCHC 31.1 30.0 - 36.0 g/dL   RDW 14.0 11.5 - 15.5 %   Platelets 211 150 - 400 K/uL   Neutrophils Relative % 66 %   Neutro Abs 2.5 1.7 - 7.7 K/uL   Lymphocytes Relative 20 %   Lymphs Abs 0.8 0.7 - 4.0 K/uL   Monocytes Relative 10 %   Monocytes Absolute 0.4 0.1 - 1.0 K/uL   Eosinophils Relative 2 %   Eosinophils Absolute 0.1 0.0 - 0.7 K/uL   Basophils Relative 0 %   Basophils Absolute 0.0 0.0 - 0.1 K/uL   Immature Granulocytes 0 %   Abs Immature Granulocytes 0.0 0.0 -   0.1 K/uL    Comment: Performed at [facility], [address] [place], [place] [address]  I-stat troponin, ED     Status: None   Collection Time: 11/21/17  6:14 PM  Result Value Ref Range   Troponin i, poc 0.02 0.00 - 0.08 ng/mL   Comment 3            Comment: Due to the release kinetics of cTnI, a negative result within the first hours of the onset of symptoms does not rule out myocardial infarction with certainty. If myocardial infarction is still suspected, repeat the test at appropriate intervals.   CBG monitoring, ED     Status: None   Collection Time: 11/21/17 11:02 PM  Result Value Ref Range   Glucose-Capillary 87 70 - 99 mg/dL  CBG monitoring, ED     Status: Abnormal   Collection Time: 11/22/17 12:07 AM  Result  Value Ref Range   Glucose-Capillary 114 (H) 70 - 99 mg/dL   Dg Chest 2 View  Result Date: 11/21/2017 CLINICAL DATA:  Chest pain for 1 day EXAM: CHEST - 2 VIEW COMPARISON:  10/28/2017 FINDINGS: Cardiac shadow remains enlarged. The lungs are well aerated bilaterally. No focal infiltrate or sizable effusion is seen. No bony abnormality is noted. IMPRESSION: No active cardiopulmonary disease. Electronically Signed   By: Mark  Lukens M.D.   On: 11/21/2017 19:14    ECG/TELE: Normal sinus rhythm with low voltage and T wave inversions in lateral leads  ROS: As above. Otherwise, review of systems is negative unless per above HPI  Vitals:   11/21/17 2130 11/21/17 2136 11/21/17 2230 11/22/17 0008  BP: 116/79 123/75 121/85 125/85  Pulse: 76 72  70  Resp: 11 18 (!) 21 18  Temp:  98.3 F (36.8 C)  98.2 F (36.8 C)  TempSrc:  Oral  Oral  SpO2: 100% 99%  97%  Weight:      Height:       Wt Readings from Last 10 Encounters:  11/21/17 115.7 kg (255 lb)  10/28/17 120.5 kg (265 lb 9.6 oz)  10/15/17 118.6 kg (261 lb 6.4 oz)  09/27/17 121 kg (266 lb 12.8 oz)  07/28/17 118.7 kg (261 lb 9.6 oz)  04/26/17 118.9 kg (262 lb 3.2 oz)  04/08/17 120.7 kg (266 lb 3.2 oz)  02/19/17 115.2 kg (254 lb)  01/28/17 118 kg (260 lb 3.2 oz)  11/24/16 116.3 kg (256 lb 6.4 oz)    PE:  General: No acute distress HEENT: Atraumatic, EOMI, mucous membranes moist. No JVD at 45 degrees. No HJR. CV: RRR no murmurs, gallops.  Respiratory: Clear, no crackles. Normal work of breathing ABD: Non-distended and non-tender. No palpable organomegaly.  Extremities: 2+ radial pulses bilaterally (LUE AVF). 1+ edema. Neuro/Psych: CN grossly intact, alert and oriented  Assessment/Plan CAD status post OM1 stenting in 2014 Hypertension CKD status post kidney transplant Diabetes with neuropathy Sleep apnea Hyperlipidemia Obesity   Patient symptoms and EKG changes possibly concerning of obstructive coronary disease.  Initial  troponin is negative, second is pending.  He is currently asymptomatic.  The type and timing of an ischemic evaluation for him must be weighed against his renal transplant.  At this time, watch him closely on telemetry and continue home aspirin, Plavix, and start IV heparin.  Would get TTE to look for wall motion abnormalities.  He is asymptomatic and resting comfortably at this time.  We will continue to follow closely.    Recs: - ASA, clopidogrel,   hepatin - TTE - Cycle troponin - Continue home atorva, amlodipine - Currently getting some fluid back, he feels dehydrated, re-evaluate fluid status tomorrow  We will follow closely  Lolita Cram Means  MD 11/22/2017, 12:37 AM

## 2017-11-22 NOTE — ED Notes (Signed)
Patient transferred to hospital bed.  

## 2017-11-23 ENCOUNTER — Observation Stay (HOSPITAL_BASED_OUTPATIENT_CLINIC_OR_DEPARTMENT_OTHER): Payer: Medicare Other

## 2017-11-23 DIAGNOSIS — E0843 Diabetes mellitus due to underlying condition with diabetic autonomic (poly)neuropathy: Secondary | ICD-10-CM

## 2017-11-23 DIAGNOSIS — I25118 Atherosclerotic heart disease of native coronary artery with other forms of angina pectoris: Secondary | ICD-10-CM

## 2017-11-23 DIAGNOSIS — I200 Unstable angina: Secondary | ICD-10-CM | POA: Diagnosis not present

## 2017-11-23 DIAGNOSIS — G4733 Obstructive sleep apnea (adult) (pediatric): Secondary | ICD-10-CM | POA: Diagnosis not present

## 2017-11-23 DIAGNOSIS — I25110 Atherosclerotic heart disease of native coronary artery with unstable angina pectoris: Secondary | ICD-10-CM | POA: Diagnosis not present

## 2017-11-23 DIAGNOSIS — I259 Chronic ischemic heart disease, unspecified: Secondary | ICD-10-CM | POA: Diagnosis not present

## 2017-11-23 DIAGNOSIS — I249 Acute ischemic heart disease, unspecified: Secondary | ICD-10-CM | POA: Diagnosis not present

## 2017-11-23 DIAGNOSIS — R079 Chest pain, unspecified: Secondary | ICD-10-CM | POA: Diagnosis not present

## 2017-11-23 LAB — NM MYOCAR MULTI W/SPECT W/WALL MOTION / EF
CSEPED: 0 min
CSEPEW: 1 METS
Exercise duration (sec): 0 s
Peak HR: 85 {beats}/min
Rest HR: 68 {beats}/min

## 2017-11-23 LAB — CBC
HEMATOCRIT: 40.1 % (ref 39.0–52.0)
HEMOGLOBIN: 12.6 g/dL — AB (ref 13.0–17.0)
MCH: 26.3 pg (ref 26.0–34.0)
MCHC: 31.4 g/dL (ref 30.0–36.0)
MCV: 83.5 fL (ref 78.0–100.0)
Platelets: 222 10*3/uL (ref 150–400)
RBC: 4.8 MIL/uL (ref 4.22–5.81)
RDW: 14.1 % (ref 11.5–15.5)
WBC: 2.8 10*3/uL — ABNORMAL LOW (ref 4.0–10.5)

## 2017-11-23 LAB — MAGNESIUM: Magnesium: 2.1 mg/dL (ref 1.7–2.4)

## 2017-11-23 LAB — BASIC METABOLIC PANEL
Anion gap: 9 (ref 5–15)
BUN: 9 mg/dL (ref 8–23)
CHLORIDE: 102 mmol/L (ref 98–111)
CO2: 27 mmol/L (ref 22–32)
CREATININE: 1.37 mg/dL — AB (ref 0.61–1.24)
Calcium: 9 mg/dL (ref 8.9–10.3)
GFR calc Af Amer: 59 mL/min — ABNORMAL LOW (ref >60)
GFR calc non Af Amer: 51 mL/min — ABNORMAL LOW (ref >60)
Glucose, Bld: 218 mg/dL — ABNORMAL HIGH (ref 70–99)
Potassium: 3.6 mmol/L (ref 3.5–5.1)
Sodium: 138 mmol/L (ref 135–145)

## 2017-11-23 LAB — GLUCOSE, CAPILLARY
GLUCOSE-CAPILLARY: 199 mg/dL — AB (ref 70–99)
GLUCOSE-CAPILLARY: 234 mg/dL — AB (ref 70–99)
Glucose-Capillary: 192 mg/dL — ABNORMAL HIGH (ref 70–99)

## 2017-11-23 LAB — HEPARIN LEVEL (UNFRACTIONATED): Heparin Unfractionated: 0.49 IU/mL (ref 0.30–0.70)

## 2017-11-23 MED ORDER — INSULIN NPH (HUMAN) (ISOPHANE) 100 UNIT/ML ~~LOC~~ SUSP
10.0000 [IU] | Freq: Two times a day (BID) | SUBCUTANEOUS | Status: DC
  Filled 2017-11-23: qty 10

## 2017-11-23 MED ORDER — REGADENOSON 0.4 MG/5ML IV SOLN
INTRAVENOUS | Status: AC
  Filled 2017-11-23: qty 5

## 2017-11-23 MED ORDER — NITROGLYCERIN 0.4 MG SL SUBL: 0.4000 mg | SUBLINGUAL_TABLET | SUBLINGUAL | 0 refills | Status: DC | PRN

## 2017-11-23 MED ORDER — TECHNETIUM TC 99M TETROFOSMIN IV KIT
10.0000 | PACK | Freq: Once | INTRAVENOUS | Status: AC | PRN
  Administered 2017-11-23: 10 via INTRAVENOUS

## 2017-11-23 MED ORDER — TECHNETIUM TC 99M TETROFOSMIN IV KIT
30.0000 | PACK | Freq: Once | INTRAVENOUS | Status: AC | PRN
Start: 2017-11-23 — End: 2017-11-23
  Administered 2017-11-23: 30 via INTRAVENOUS

## 2017-11-23 MED ORDER — REGADENOSON 0.4 MG/5ML IV SOLN
0.4000 mg | Freq: Once | INTRAVENOUS | Status: AC
  Administered 2017-11-23: 0.4 mg via INTRAVENOUS
  Filled 2017-11-23: qty 5

## 2017-11-23 MED ORDER — FUROSEMIDE 40 MG PO TABS: 40.0000 mg | ORAL_TABLET | Freq: Every day | ORAL | Status: DC

## 2017-11-23 MED ORDER — INSULIN NPH (HUMAN) (ISOPHANE) 100 UNIT/ML ~~LOC~~ SUSP: 10.0000 [IU] | Freq: Two times a day (BID) | SUBCUTANEOUS | 0 refills | Status: DC

## 2017-11-23 NOTE — Discharge Instructions (Signed)

## 2017-11-23 NOTE — Progress Notes (Signed)
Progress Note  Patient Name: [name] Date of Encounter: 11/23/2017  Primary Cardiologist: No primary care provider on file.   Subjective   No complaints of chest pain overnight. Denies SOB or palpitations  Inpatient Medications    Scheduled Meds: . allopurinol  100 mg Oral BID  . amLODipine  10 mg Oral Daily  . aspirin  81 mg Oral QHS  . atorvastatin  40 mg Oral Daily  . clopidogrel  75 mg Oral Daily  . diazepam  5 mg Oral QHS  . gabapentin  200 mg Oral Q12H  . insulin aspart  0-5 Units Subcutaneous QHS  . insulin aspart  0-9 Units Subcutaneous TID WC  . magnesium oxide  200 mg Oral Daily  . mycophenolate  180 mg Oral BID  . pantoprazole  40 mg Oral Daily  . regadenoson      . tacrolimus  4 mg Oral BID   Continuous Infusions: . heparin 1,100 Units/hr (11/23/17 0100)   PRN Meds: acetaminophen, nitroGLYCERIN, ondansetron (ZOFRAN) IV, traMADol   Vital Signs    Vitals:   11/23/17 0332 11/23/17 0709 11/23/17 0930 11/23/17 0955  BP: 125/72 137/79 130/78 136/71  Pulse: 66  66 86  Resp: 17 18 (!) 9   Temp: 98.6 F (37 C) (!) 97.5 F (36.4 C)    TempSrc: Oral Oral    SpO2: 95%     Weight:      Height:        Intake/Output Summary (Last 24 hours) at 11/23/2017 0955 Last data filed at 11/23/2017 6195 Gross per 24 hour  Intake 255.12 ml  Output 500 ml  Net -244.88 ml   Filed Weights   11/21/17 1900 11/21/17 1917 11/22/17 1245  Weight: 264 lb 8.8 oz (120 kg) 255 lb (115.7 kg) 251 lb (113.9 kg)    Telemetry    Unable to assess - patient seen in nuclear medicine - Personally Reviewed  Physical Exam   GEN: Laying in bed in no acute distress.   Neck: No JVD, no carotid bruits Cardiac: RRR, no murmurs, rubs, or gallops.  Respiratory: Clear to auscultation bilaterally, no wheezes/ rales/ rhonchi GI: NABS, Soft, obese, nontender, non-distended  MS: 1+ LE edema; No deformity. Neuro:  Nonfocal, moving all extremities spontaneously Psych: Normal affect    Labs    Chemistry Recent Labs  Lab 11/21/17 1809 11/23/17 0211  NA 139 138  K 3.6 3.6  CL 101 102  CO2 28 27  GLUCOSE 125* 218*  BUN 11 9  CREATININE 1.54* 1.37*  CALCIUM 9.1 9.0  PROT 5.9*  --   ALBUMIN 3.1*  --   AST 25  --   ALT 18  --   ALKPHOS 94  --   BILITOT 1.0  --   GFRNONAA 45* 51*  GFRAA 52* 59*  ANIONGAP 10 9     Hematology Recent Labs  Lab 11/21/17 1809 11/22/17 0305 11/23/17 0211  WBC 3.8* 4.1 2.8*  RBC 5.11 5.46 4.80  HGB 13.3 14.2 12.6*  HCT 42.7 46.8 40.1  MCV 83.6 85.7 83.5  MCH 26.0 26.0 26.3  MCHC 31.1 30.3 31.4  RDW 14.0 14.1 14.1  PLT 211 203 222    Cardiac Enzymes Recent Labs  Lab 11/22/17 0003 11/22/17 0305 11/22/17 0842  TROPONINI <0.03 <0.03 <0.03    Recent Labs  Lab 11/21/17 1814  TROPIPOC 0.02     BNPNo results for input(s): BNP, PROBNP in the last 168 hours.   DDimer  No results for input(s): DDIMER in the last 168 hours.   Radiology    Dg Chest 2 View  Result Date: 11/21/2017 CLINICAL DATA:  Chest pain for 1 day EXAM: CHEST - 2 VIEW COMPARISON:  10/28/2017 FINDINGS: Cardiac shadow remains enlarged. The lungs are well aerated bilaterally. No focal infiltrate or sizable effusion is seen. No bony abnormality is noted. IMPRESSION: No active cardiopulmonary disease. Electronically Signed   By: Inez Catalina M.D.   On: 11/21/2017 19:14    Cardiac Studies   Cardiac catheterization at [facility] 2014: [facility]  OTHER:Cardiac catheterization was performed via right radial  arteriotomy.Coronary angiography showed a significant 75% 1st obtuse  marginal stenosis for which PCI was performed with a DES; see PCI report for  details.The RCA was a non-dominant small vessel with a proximal 95%  stenosis for which PCI was not warranted.Remainder of the Left Main, LAD  and ongoing LCx systems showed mild-to-moderate non-obstructive ASCAD as  outlined above.Post-PCI, a TR band was placed on the right wrist for   Hemostasis.  Echocardiogram 11/22/17: Study Conclusions  - Left ventricle: The cavity size was normal. Wall thickness was   increased in a pattern of severe LVH. Systolic function was   normal. The estimated ejection fraction was in the range of 60%   to 65%. Wall motion was normal; there were no regional wall   motion abnormalities. Features are consistent with a pseudonormal   left ventricular filling pattern, with concomitant abnormal   relaxation and increased filling pressure (grade 2 diastolic   dysfunction). - Aortic valve: There was trivial regurgitation.    Patient Profile     67 y.o. male with history of CAD status post stentingof OM1 in 2014, CVA, diabetes,thoracic AAA (4.7 cm), left upper extremity AVF not in use,CKD status post renal transplant 2 years prior presenting with acute onset chest pain.   Assessment & Plan    1. Chest pain in patient with history of PCI to [place] 2014: patient presented with acute onset exertional chest pain and heaviness c/f unstable angina. Trops have been negative this admission. EKG without STE/D but did have new diffuse TWI compared to prior. Echo yesterday with EF 60-65%, no wall motion abnormalities, severe LVH, and G2DD. Patient is undergoing NST at this time. He does have some LE edema today as his lasix has been on hold this admission - f/u NST results today - Continue ASA, plavix, statin, amlodipine, and heparin - Could consider dosing po lasix today given stable Cr.   2. CKD s/p renal transplant: underwent transplant 2 year ago. Cr stable at 1.37 today.  - Continue to monitor closely  3. HTN: BP stable - Continue current regimen  4. DM type 2: poorly controlled; last A1C 9.2 09/2017 - Continue management per primary team  For questions or updates, please contact [place] Please consult [email or web address] for contact info under Cardiology/STEMI.      Signed, Abigail Butts, PA-C  11/23/2017, 9:55 AM   [phone]

## 2017-11-23 NOTE — Progress Notes (Signed)
   [name] presented for a nuclear stress test today.  No immediate complications.  Stress imaging is pending at this time.  Preliminary EKG findings may be listed in the chart, but the stress test result will not be finalized until perfusion imaging is complete.  One day study.  Abigail Butts, PA-C 11/23/2017, 10:09 AM

## 2017-11-23 NOTE — Plan of Care (Signed)
  Problem: Education: Goal: Knowledge of General Education information will improve Outcome: Progressing   Problem: Health Behavior/Discharge Planning: Goal: Ability to manage health-related needs will improve Outcome: Progressing   Problem: Clinical Measurements: Goal: Ability to maintain clinical measurements within normal limits will improve Outcome: Progressing Goal: Will remain free from infection Outcome: Progressing Goal: Diagnostic test results will improve Outcome: Progressing Goal: Respiratory complications will improve Outcome: Progressing Goal: Cardiovascular complication will be avoided Outcome: Progressing   Problem: Activity: Goal: Risk for activity intolerance will decrease Outcome: Progressing   Problem: Nutrition: Goal: Adequate nutrition will be maintained Outcome: Progressing   Problem: Coping: Goal: Level of anxiety will decrease Outcome: Progressing   Problem: Elimination: Goal: Will not experience complications related to bowel motility Outcome: Progressing Goal: Will not experience complications related to urinary retention Outcome: Progressing   Problem: Pain Managment: Goal: General experience of comfort will improve Outcome: Progressing   Problem: Safety: Goal: Ability to remain free from injury will improve Outcome: Progressing   Problem: Skin Integrity: Goal: Risk for impaired skin integrity will decrease Outcome: Progressing   Problem: Education: Goal: Knowledge of General Education information will improve Outcome: Progressing   Problem: Health Behavior/Discharge Planning: Goal: Ability to manage health-related needs will improve Outcome: Progressing   Problem: Clinical Measurements: Goal: Ability to maintain clinical measurements within normal limits will improve Outcome: Progressing Goal: Will remain free from infection Outcome: Progressing Goal: Diagnostic test results will improve Outcome: Progressing

## 2017-11-23 NOTE — Progress Notes (Signed)
ANTICOAGULATION CONSULT NOTE - Follow Up Consult  Pharmacy Consult for heparin Indication: USAP  Labs: Recent Labs    11/21/17 1809 11/22/17 0003 11/22/17 0305 11/22/17 0500 11/22/17 0842 11/22/17 1516 11/22/17 2317  HGB 13.3  --  14.2  --   --   --   --   HCT 42.7  --  46.8  --   --   --   --   PLT 211  --  203  --   --   --   --   HEPARINUNFRC  --   --   --  0.69  --  0.74* 0.68  CREATININE 1.54*  --   --   --   --   --   --   TROPONINI  --  <0.03 <0.03  --  <0.03  --   --     Assessment/Plan:  67yo male therapeutic on heparin after rate change. Will continue gtt at current rate and confirm stable with am labs.   Wynona Neat, PharmD, BCPS  11/23/2017,12:26 AM

## 2017-11-23 NOTE — Progress Notes (Signed)
ANTICOAGULATION CONSULT NOTE  Pharmacy Consult for heparin Indication: unstable angina  Patient Measurements: Height: 5\' 10"  (177.8 cm) Weight: 251 lb (113.9 kg) IBW/kg (Calculated) : 73 Heparin Dosing Weight: 99.9 kg  Vital Signs: Temp: 97.5 F (36.4 C) (07/02 0709) Temp Source: Oral (07/02 0709) BP: 137/79 (07/02 0709) Pulse Rate: 66 (07/02 0332)  Labs: Recent Labs    11/21/17 1809 11/22/17 0003 11/22/17 0305  11/22/17 0842 11/22/17 1516 11/22/17 2317 11/23/17 0211  HGB 13.3  --  14.2  --   --   --   --  12.6*  HCT 42.7  --  46.8  --   --   --   --  40.1  PLT 211  --  203  --   --   --   --  222  HEPARINUNFRC  --   --   --    < >  --  0.74* 0.68 0.49  CREATININE 1.54*  --   --   --   --   --   --  1.37*  TROPONINI  --  <0.03 <0.03  --  <0.03  --   --   --    < > = values in this interval not displayed.    Assessment: Admitted with chest pain. Patient has a hx of stroke and MI. Planning to start heparin for rule out ACS.   Heparin level slightly high at 0.49 this afternoon. CBC stable. No bleed or IV line issues per discussion with RN.  Goal of Therapy:  Heparin level 0.3-0.7 units/ml Monitor platelets by anticoagulation protocol: Yes   Plan:  Continue heparin to 1100 units/hr Monitor daily heparin level and CBC, s/sx bleeding  Erin Hearing PharmD., BCPS Clinical Pharmacist 11/23/2017 8:53 AM

## 2017-11-23 NOTE — Discharge Summary (Signed)
[name]  MR#: [id number]  DOB:07-29-1950  Date of Admission: 11/21/2017 Date of Discharge: 11/23/2017  Attending Physician:Sven Pinheiro Hennie Duos, MD  Patient's [name], Ronald Mars, MD  Consults:  [facility] Cardiology   Disposition: D/C home   Follow-up Appts: Follow-up Information    Barrett, [name], PA-C Follow up on 12/16/2017.   Specialties:  Cardiology, Radiology Why:  Please arrive 15 minutes early for your 8:30am cardiology appointment Contact information: [name] [place] [address] [phone]        [name], MD Follow up in 1 week(s).   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    In the ER the patient's troponin was negative and EKG noted nonspecific findings.   Hospital Course:  Chest pain - CAD status post OM1 stenting in 2014 Ruled out for acute MI, but sx worrisome for true angina - nuc med stress test completed and was worrisome for inducible ischemia in the inferolateral wall - option of cath v/s med tx only offered by Cards, and pt does not wish to undergo cath  - cleared for d/c by Cards - meds as suggested by Cards as follows: "aspirin 81 mg, atorvastatin 40 mg, clopidogrel 75 mg, amlodipine 10 mg. Furosemide should be guided by renal  transplant provider as outpatient, as he appears euvolemic from cardiovascular standpoint"  Hypertension BP stable   CKD status post kidney transplant 2017 Baseline crt appears to be ~1.7 as of April & June 2019 - currently at baseline  DM2 with neuropathy A1c 9.2 on 09/27/17 - CBG well controlled during hospital stay on SSI - this is markedly less coverage than his reported 180 units (one-hundred eighty) of NPH QAM at home - I have counseled him on the conundrum of prescribing his home DM regimen - I have advised him that he should continue w/ strict diet compliance such as that he has been held to in the hospital - I fear that sending him home on his prior home regimen will lead to life threatening hypoglycemia should he stick with a true DM diet - I am placing him on a new regimen of NPH 10U BID based on weight - he is advised to follow his CBG 3-4 times a day - he is advised that if he does follow a DM diet this will likely lead to severe hyperglycemia and probable need for hospitalization - his insulin will most likely need to be adjusted in outpt follow up and he assures me he is arranging for an Endocrinology visit in short course post-D/C  Hypomagnesemia Replaced to normal range   Sleep apnea On CPAP - cont home regimen   Hyperlipidemia Cont Lipitor   Obesity - Body mass index is 36.01 kg/m. Counseled on need for weight loss   Allergies as of 11/23/2017   No Known Allergies  Medication List    TAKE these medications   ACCU-CHEK SOFTCLIX LANCETS lancets Use three times daily   acetaminophen 500 MG tablet Commonly known as:  TYLENOL Take 1,000 mg by mouth as needed for mild pain or headache.   allopurinol 100 MG tablet Commonly known as:  ZYLOPRIM Take 100 mg by mouth 2 (two) times daily.   amLODipine 10 MG tablet Commonly known as:  NORVASC Take 10 mg by mouth daily.   aspirin 81 MG tablet Take 81 mg by mouth at bedtime.   atorvastatin 40 MG  tablet Commonly known as:  LIPITOR Take 40 mg by mouth daily.   clopidogrel 75 MG tablet Commonly known as:  PLAVIX Take 75 mg by mouth daily.   cyclobenzaprine 5 MG tablet Commonly known as:  FLEXERIL Take 0.5-1 tablets (2.5-5 mg total) 3 (three) times daily as needed by mouth for muscle spasms.   diazepam 5 MG tablet Commonly known as:  VALIUM Take 1 tablet (5 mg total) by mouth at bedtime.   FREESTYLE LIBRE 14 DAY READER Devi 1 Device by Does not apply route daily.   FREESTYLE LIBRE 14 DAY SENSOR Misc 1 Device by Does not apply route every 14 (fourteen) days.   furosemide 40 MG tablet Commonly known as:  LASIX Take 1 tablet (40 mg total) by mouth daily. What changed:    how much to take  when to take this   gabapentin 100 MG capsule Commonly known as:  NEURONTIN 2 in the am, 2 in the pm, and 2 if needed at night What changed:    how much to take  how to take this  when to take this  additional instructions   glucose blood test strip Commonly known as:  ACCU-CHEK AVIVA PLUS USE AS DIRECTED CHECK BLOOD SUGAR TWICE A DAY   insulin NPH Human 100 UNIT/ML injection Commonly known as:  HUMULIN N,NOVOLIN N Inject 0.1 mLs (10 Units total) into the skin 2 (two) times daily at 8 am and 10 pm. What changed:    how much to take  how to take this  when to take this  additional instructions   Insulin Syringe-Needle U-100 31G X 5/16" 1 ML Misc Commonly known as:  B-D INS SYR ULTRAFINE 1CC/31G Used to give insulin injections twice daily.   Magnesium 200 MG Tabs Take 200 mg by mouth daily with lunch.   mycophenolate 180 MG EC tablet Commonly known as:  MYFORTIC Take 180 mg by mouth 2 (two) times daily.   nitroGLYCERIN 0.4 MG SL tablet Commonly known as:  NITROSTAT Place 1 tablet (0.4 mg total) under the tongue every 5 (five) minutes as needed for chest pain.   omeprazole 20 MG capsule Commonly known as:  PRILOSEC Take 20 mg by mouth daily.   Tacrolimus  1 MG Cp24 Take 4 mg by mouth 2 (two) times daily.   traMADol 50 MG tablet Commonly known as:  ULTRAM Take 1 tablet (50 mg total) by mouth every 8 (eight) hours as needed for moderate pain or severe pain. What changed:    when to take this  additional instructions       Day of Discharge BP 116/67 (BP Location: Right Arm)   Pulse 76   Temp 98.5 F (36.9 C) (Oral)   Resp 12   Ht 5\' 10"  (1.778 m)   Wt 113.9 kg (251 lb)   SpO2 95%   BMI 36.01 kg/m   Physical Exam: General: No acute respiratory distress  Lungs: Clear to auscultation bilaterally without wheezes or crackles Cardiovascular: Regular rate and rhythm without murmur gallop or rub normal S1 and S2 Abdomen: Nontender, nondistended, soft, bowel sounds positive, no rebound, no ascites, no appreciable mass Extremities: No significant cyanosis, clubbing, or edema bilateral lower extremities  Basic Metabolic Panel: Recent Labs  Lab 11/21/17 1809 11/22/17 0003 11/23/17 0211  NA 139  --  138  K 3.6  --  3.6  CL 101  --  102  CO2 28  --  27  GLUCOSE 125*  --  218*  BUN 11  --  9  CREATININE 1.54*  --  1.37*  CALCIUM 9.1  --  9.0  MG  --  1.6* 2.1    Liver Function Tests: Recent Labs  Lab 11/21/17 1809  AST 25  ALT 18  ALKPHOS 94  BILITOT 1.0  PROT 5.9*  ALBUMIN 3.1*    CBC: Recent Labs  Lab 11/21/17 1809 11/22/17 0305 11/23/17 0211  WBC 3.8* 4.1 2.8*  NEUTROABS 2.5  --   --   HGB 13.3 14.2 12.6*  HCT 42.7 46.8 40.1  MCV 83.6 85.7 83.5  PLT 211 203 222    Cardiac Enzymes: Recent Labs  Lab 11/22/17 0003 11/22/17 0305 11/22/17 0842  TROPONINI <0.03 <0.03 <0.03    CBG: Recent Labs  Lab 11/22/17 1654 11/22/17 2125 11/23/17 0508 11/23/17 0754 11/23/17 1242  GLUCAP 120* 215* 199* 192* 234*    Recent Results (from the past 240 hour(s))  MRSA PCR Screening     Status: None   Collection Time: 11/22/17 12:24 PM  Result Value Ref Range Status   MRSA by PCR NEGATIVE NEGATIVE Final     Comment:        The GeneXpert MRSA Assay (FDA approved for NASAL specimens only), is one component of a comprehensive MRSA colonization surveillance program. It is not intended to diagnose MRSA infection nor to guide or monitor treatment for MRSA infections. Performed at [facility], [place] [address] [place], [place] [address]      Time spent in discharge (includes decision making & examination of pt): 35 minutes  11/23/2017, 4:58 PM   Cherene Altes, MD [facility] Office  [phone] Pager [phone]  On-Call/Text Page:      [email or web address]      password [facility]

## 2017-11-23 NOTE — Progress Notes (Addendum)
Inpatient Diabetes Program Recommendations  AACE/ADA: New Consensus Statement on Inpatient Glycemic Control (2015)  Target Ranges:  Prepandial:   less than 140 mg/dL      Peak postprandial:   less than 180 mg/dL (1-2 hours)      Critically ill patients:  140 - 180 mg/dL   Lab Results  Component Value Date   GLUCAP 234 (H) 11/23/2017   HGBA1C 9.2 09/27/2017    Review of Glycemic Control Results for MILLER, LIMEHOUSE (MRN [id number]) as of 11/23/2017 15:14  Ref. Range 11/22/2017 21:25 11/23/2017 05:08 11/23/2017 07:54 11/23/2017 12:42  Glucose-Capillary Latest Ref Range: 70 - 99 mg/dL 215 (H) 199 (H) 192 (H) 234 (H)   Diabetes history: Type 2 DM Outpatient Diabetes medications: NPH 180 units QAM Current orders for Inpatient glycemic control: Novolog 0-9 units TID, Novolog 0-5 units QHS  Inpatient Diabetes Program Recommendations:    In preparation for discharge, patient was on home dose of NPH 180 units QAM. His insulin needs while inpatient are significantly less. Of note, he has been NPO the majority of the hospitalization. Will plan to reach out to Dr Thereasa Solo regarding discharge orders for adjustments.   Spoke with patient at length regarding outpatient DM management. Patient sees Dr Loanne Drilling and explains that he has had difficulty managing his diabetes and has been attempting to obtain Freestyle libre over two months. He explains that his A1C has been poorly controlled since he started NPH and feels that Lantus/Novolog work better. Patient expresses frustration with whole process and feels that he could use additional assistance.  Reviewed patient's current A1c of 9.2%. Explained what a A1c is and what it measures. Also reviewed goal A1c with patient, importance of good glucose control @ home, and blood sugar goals. Reviewed patho of DM, need for insulin, patho of beta cells and role of pancreas, and the long term impacts especially considering history of renal transplant. Discussed the differences  between basal, short acting and NPH and included peak times and duration of action. Discussed survival skills, interventions, long term co morbidities of poor control, and impacts of immunosuppressive therapy.  Patient reports checking BS 3-4 times per day. Never has hypoglycemia. Patient states, "I am always high 400's in the AM. I call to communicate this with my endocrinologist and adjustments are made, but it doesn't make a difference on my blood sugars."  Patient plans to purchase Freestyle Libre once discharged out of pocket. Provided savings card. Encouragement provided with use of Libre.   Thanks, Bronson Curb, MSN, RNC-OB Diabetes Coordinator [phone] (8a-5p)  Addendum @1620 : Spoke with Dr Thereasa Solo regarding outpatient plan for insulin. Discussed inpatient insulin needs, NPO status and progression to carb mod compared with BS, and outpatient home regimen. Reported that patient denies lows and plans to follow up with endo. No additional orders, will plan for patient to follow up. Patient has been educated on hypoglycemia, principles of NPH, and amount of insulin required inpatient. No additional questions.

## 2017-11-23 NOTE — Care Management Obs Status (Signed)
[place] NOTIFICATION   Patient Details  Name: ZEKI BEDROSIAN MRN: [id number] Date of Birth: Sep 25, 1950   Medicare Observation Status Notification Given:  Yes   Pt off floor prior - CM completed [name] letter once pt returned to unit    Maryclare Labrador, RN 11/23/2017, 11:34 AM

## 2017-11-24 ENCOUNTER — Telehealth: Payer: Self-pay | Admitting: *Deleted

## 2017-11-24 ENCOUNTER — Other Ambulatory Visit: Payer: Self-pay | Admitting: *Deleted

## 2017-11-24 MED ORDER — FREESTYLE LIBRE 14 DAY SENSOR MISC: 1.0000 | 11 refills | Status: DC

## 2017-11-24 MED ORDER — FREESTYLE LIBRE 14 DAY READER DEVI: 1.0000 | Freq: Every day | 0 refills | Status: DC

## 2017-11-24 NOTE — Telephone Encounter (Addendum)
Dr Yong Channel, I forgot to mention that patient requests a new endocrinologist. He states it is not working out at his current endocrinologist. Patient currently sees Dr Jeanann Lewandowsky.

## 2017-11-24 NOTE — Telephone Encounter (Signed)
Noted thanks °

## 2017-11-24 NOTE — Telephone Encounter (Signed)
Attempted TCM call. Left voicemail requesting call back.

## 2017-11-24 NOTE — Telephone Encounter (Addendum)
Per chart review:   DaDate of Admission: 11/21/2017 Date of Discharge: 11/23/2017  Attending Physician:Jeffrey Hennie Duos, MD  Patient's KKX:FGHWEX, [name], MD  Consults:  [facility]   Disposition: D/C home   ________________________________________________________________ Per TCM Call: Transition Care Management Follow-up Telephone Call   Date discharged? 11/23/17   How have you been since you were released from the hospital? "Ok, on the mend."    Do you understand why you were in the hospital? yes   Do you understand the discharge instructions? yes   Where were you discharged to? Home   Items Reviewed:  Medications reviewed: yes  Allergies reviewed: yes  Dietary changes reviewed: yes  Referrals reviewed: yes   Functional Questionnaire:   Activities of Daily Living (ADLs):   He states they are independent in the following: ambulation, bathing and hygiene, feeding, continence, grooming, toileting and dressing States they require assistance with the following: none.    Any transportation issues/concerns?: no   Any patient concerns? Yes. Patient states that he needed a new prescription for the 14 day Free [employer], he did not receive this. New order was placed.    Confirmed importance and date/time of follow-up visits scheduled yes  Provider Appointment booked with Dr Yong Channel 12/03/17.  Confirmed with patient if condition begins to worsen call PCP or go to the ER.  Patient was given the office number and encouraged to call back with question or concerns.  : yes

## 2017-11-26 ENCOUNTER — Other Ambulatory Visit: Payer: Self-pay

## 2017-11-26 DIAGNOSIS — E0843 Diabetes mellitus due to underlying condition with diabetic autonomic (poly)neuropathy: Secondary | ICD-10-CM

## 2017-11-26 NOTE — Telephone Encounter (Signed)
Team- you may enter referral to endocrinology under diabetes mellitus type II

## 2017-11-26 NOTE — Telephone Encounter (Signed)
Referral to Endocrinology placed.

## 2017-12-02 ENCOUNTER — Telehealth: Payer: Self-pay

## 2017-12-02 NOTE — Telephone Encounter (Signed)
Roxanna called from [facility] asking if we have received CMN and chart notes request for this patient for his CGM- please contact her at [phone] to give her the status of this

## 2017-12-03 ENCOUNTER — Ambulatory Visit (INDEPENDENT_AMBULATORY_CARE_PROVIDER_SITE_OTHER): Payer: Medicare Other | Admitting: Family Medicine

## 2017-12-03 ENCOUNTER — Telehealth: Payer: Self-pay

## 2017-12-03 ENCOUNTER — Encounter: Payer: Self-pay | Admitting: Family Medicine

## 2017-12-03 VITALS — BP 118/82 | HR 97 | Temp 99.3°F | Ht 70.0 in | Wt 250.4 lb

## 2017-12-03 DIAGNOSIS — F4310 Post-traumatic stress disorder, unspecified: Secondary | ICD-10-CM

## 2017-12-03 DIAGNOSIS — E0843 Diabetes mellitus due to underlying condition with diabetic autonomic (poly)neuropathy: Secondary | ICD-10-CM

## 2017-12-03 DIAGNOSIS — Z23 Encounter for immunization: Secondary | ICD-10-CM | POA: Diagnosis not present

## 2017-12-03 DIAGNOSIS — I10 Essential (primary) hypertension: Secondary | ICD-10-CM

## 2017-12-03 DIAGNOSIS — I25118 Atherosclerotic heart disease of native coronary artery with other forms of angina pectoris: Secondary | ICD-10-CM | POA: Diagnosis not present

## 2017-12-03 DIAGNOSIS — E785 Hyperlipidemia, unspecified: Secondary | ICD-10-CM

## 2017-12-03 DIAGNOSIS — G4733 Obstructive sleep apnea (adult) (pediatric): Secondary | ICD-10-CM

## 2017-12-03 LAB — CBC
HCT: 43 % (ref 39.0–52.0)
Hemoglobin: 14.2 g/dL (ref 13.0–17.0)
MCHC: 33 g/dL (ref 30.0–36.0)
MCV: 82.4 fl (ref 78.0–100.0)
Platelets: 274 10*3/uL (ref 150.0–400.0)
RBC: 5.22 Mil/uL (ref 4.22–5.81)
RDW: 15.3 % (ref 11.5–15.5)
WBC: 4.7 10*3/uL (ref 4.0–10.5)

## 2017-12-03 LAB — COMPREHENSIVE METABOLIC PANEL
ALT: 16 U/L (ref 0–53)
AST: 13 U/L (ref 0–37)
Albumin: 4.1 g/dL (ref 3.5–5.2)
Alkaline Phosphatase: 119 U/L — ABNORMAL HIGH (ref 39–117)
BILIRUBIN TOTAL: 0.7 mg/dL (ref 0.2–1.2)
BUN: 20 mg/dL (ref 6–23)
CHLORIDE: 90 meq/L — AB (ref 96–112)
CO2: 33 meq/L — AB (ref 19–32)
CREATININE: 1.7 mg/dL — AB (ref 0.40–1.50)
Calcium: 9.7 mg/dL (ref 8.4–10.5)
GFR: 51.65 mL/min — ABNORMAL LOW (ref >60.00)
GLUCOSE: 514 mg/dL — AB (ref 70–99)
Potassium: 4.1 mEq/L (ref 3.5–5.1)
Sodium: 131 mEq/L — ABNORMAL LOW (ref 135–145)
Total Protein: 7.4 g/dL (ref 6.0–8.3)

## 2017-12-03 MED ORDER — NITROGLYCERIN 0.4 MG SL SUBL: 0.4000 mg | SUBLINGUAL_TABLET | SUBLINGUAL | 0 refills | Status: DC | PRN

## 2017-12-03 MED ORDER — OMEPRAZOLE 20 MG PO CPDR: 20.0000 mg | DELAYED_RELEASE_CAPSULE | Freq: Two times a day (BID) | ORAL | 3 refills | Status: DC

## 2017-12-03 NOTE — Telephone Encounter (Signed)
Yes paperwork was faxed 11/29/17 and fax receipt received but will re-fax today

## 2017-12-03 NOTE — Progress Notes (Signed)
Subjective:  [name] is a 67 y.o. year old very pleasant male patient who presents for transitional care management and hospital follow up for chest pain. Patient was hospitalized from November 21, 2017 to November 23, 2017. A TCM phone call was completed on November 24, 2017. Medical complexity moderate  Patient is a 67 year old male with a history of coronary artery disease status post stenting in 2017 at [facility], thoracic aortic aneurysm, renal transplant now on  immunosuppressants who presented to the hospital with substernal chest pain radiating to his jaw concerning for angina.   Patient was seen in the emergency room and initial troponin was negative and EKG findings were nonspecific.  Patient was ruled out for acute myocardial infarction but symptoms were worrisome for angina-nuclear medicine stress test was completed and was worrisome for inducible ischemia in the inferior lateral wall-option was given for catheterization versus medical treatment only by cardiology.  Patient did not want to undergo catheterization.  Patient was cleared for discharge by cardiology.  He was discharged on aspirin, atorvastatin 40 mg, Plavix 75 mg, amlodipine 10 mg.  It does not appear that any adjustments were made to his regimen.  In regards to his Lasix it was advised that he follow-up with his renal transplant provider on an outpatient basis but he appeared euvolemic before discharge.  He was discharged on 40 mg daily. He had been told by [facility] that the area of ischemia that this couldn't be stented (and that this was seen on prior cath). He has been advised to rest and take nitroglycerin.   3 episodes of chest pain since hospitalization. One today with going upstairs. He is out of nitroglycerin  Hypertension-was maintained on home medications  Chronic kidney disease status post kidney transplant in 2017.  Baseline creatinine appears to be around 1.7 and was stable during hospitalization.  He was maintained  on tacrolimus and mycophenolate  Diabetes mellitus type 2 with neuropathy- A1c has been elevated at 9.2 on 09/27/2017.  Blood sugars well controlled during hospitalization on sliding scale insulin.  He required is substantially less amount of insulin than he reports at home on a daily basis of 180 units.  This could be due to improved diet in the hospital.  Patient was reduced from 180 units of NPH in the morning to 10 units of NPH twice a day.  Patient already contacted [place] and asked for a new endocrinologist-we placed a referral already. He has bumped this up to 40 in AM and 40 in PM. Highest he has seen was 326. Mornings have been 120s and 130s with ne regimen.   Hypomagnesemia-resolved with replacement  Sleep apnea-continued on home CPAP  Hyperlipidemia-continued on Lipitor  Obesity-counseled on need for weight loss.  Technically this is morbid obesity with BMI above 35 as well as hypertension and diabetes. Down 15 lbs- lost fair amount in hospital- he has decreased his overall intake- enocuraged to continue weight loss   See problem oriented charting as well ROS- some fatigue since hospital but improving. No fever or chills. Patient denies hypoglycemia. Some chest pain notd.    Past Medical History-  Patient Active Problem List   Diagnosis Date Noted  . Morbid obesity ([place]) 12/04/2017    Priority: High  . 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 Renal transplant recipient 06/27/2015    Priority: High  . 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 2014 10/23/2014    Priority: High  .  Diabetic neuropathy ([place]) 10/14/2010    Priority: High  . CAD -s/p DES to marginal vessel at [facility] 04/09/2009    Priority: High  . Diabetes mellitus due to underlying condition with diabetic autonomic (poly)neuropathy ([place]) 09/05/2007    Priority: High  . Hyperlipidemia 01/16/2016    Priority: Medium  . Gout 12/22/2009     Priority: Medium  . Obstructive sleep apnea 07/10/2009    Priority: Medium  . BPH (benign prostatic hyperplasia) 09/25/2008    Priority: Medium  . Low back pain 12/15/2007    Priority: Medium  . Posttraumatic stress disorder 11/03/2007    Priority: Medium  . Essential hypertension 01/17/2007    Priority: Medium  . CVA (cerebral infarction) 01/17/2007    Priority: Medium  . GERD (gastroesophageal reflux disease) 05/16/2014    Priority: Low  . Trigger thumb of right hand 01/03/2014    Priority: Low  . Left foot drop 05/02/2013    Priority: Low  . Risk for falls 01/22/2012    Priority: Low  . Gastroparesis 06/19/2008    Priority: Low  . Allergic rhinitis 03/01/2007    Priority: Low  . Orthostatic hypotension 01/15/2015  . Fall at home 01/15/2015    Medications- reviewed and updated  A medical reconciliation was performed comparing current medicines to hospital discharge medications. Current Outpatient Medications  Medication Sig Dispense Refill  . acetaminophen (TYLENOL) 500 MG tablet Take 1,000 mg by mouth as needed for mild pain or headache.     . allopurinol (ZYLOPRIM) 100 MG tablet Take 100 mg by mouth 2 (two) times daily.     [name] amLODipine (NORVASC) 10 MG tablet Take 10 mg by mouth daily.    [name] aspirin 81 MG tablet Take 81 mg by mouth at bedtime.     [name] atorvastatin (LIPITOR) 40 MG tablet Take 40 mg by mouth daily.    . clopidogrel (PLAVIX) 75 MG tablet Take 75 mg by mouth daily.     . Continuous Blood Gluc Receiver (FREESTYLE LIBRE 14 DAY READER) DEVI 1 Device by Does not apply route daily. 1 Device 0  . Continuous Blood Gluc Sensor (FREESTYLE LIBRE 14 DAY SENSOR) MISC 1 Device by Does not apply route every 14 (fourteen) days. 3 each 11  . cyclobenzaprine (FLEXERIL) 5 MG tablet Take 0.5-1 tablets (2.5-5 mg total) 3 (three) times daily as needed by mouth for muscle spasms. 30 tablet 0  . diazepam (VALIUM) 5 MG tablet Take 1 tablet (5 mg total) by mouth at bedtime. 30 tablet 0   . furosemide (LASIX) 40 MG tablet Take 1 tablet (40 mg total) by mouth daily. 30 tablet   . gabapentin (NEURONTIN) 100 MG capsule 2 in the am, 2 in the pm, and 2 if needed at night (Patient taking differently: Take 200 mg by mouth See admin instructions. Take 200 mg by mouth in the morning and 200 mg at bedtime SCHEDULED and may take an additional 200 mg during the day AS NEEDED for nerve pain) 180 capsule 3  . glucose blood (ACCU-CHEK AVIVA PLUS) test strip USE AS DIRECTED CHECK BLOOD SUGAR TWICE A DAY 200 each 3  . insulin NPH Human (HUMULIN N,NOVOLIN N) 100 UNIT/ML injection Inject 0.1 mLs (10 Units total) into the skin 2 (two) times daily at 8 am and 10 pm. 10 mL 0  . Insulin Syringe-Needle U-100 (B-D INS SYR ULTRAFINE 1CC/31G) 31G X 5/16" 1 ML MISC Used to give insulin injections twice daily. 100 each 11  .  Magnesium 200 MG TABS Take 200 mg by mouth daily with lunch.     . mycophenolate (MYFORTIC) 180 MG EC tablet Take 180 mg by mouth 2 (two) times daily.    . nitroGLYCERIN (NITROSTAT) 0.4 MG SL tablet Place 1 tablet (0.4 mg total) under the tongue every 5 (five) minutes as needed for chest pain. 25 tablet 0  . omeprazole (PRILOSEC) 20 MG capsule Take 1 capsule (20 mg total) by mouth 2 (two) times daily before a meal. 180 capsule 3  . tacrolimus (PROGRAF) 1 MG capsule     . Tacrolimus 1 MG CP24 Take 4 mg by mouth 2 (two) times daily.     . traMADol (ULTRAM) 50 MG tablet Take 1 tablet (50 mg total) by mouth every 8 (eight) hours as needed for moderate pain or severe pain. (Patient taking differently: Take 50 mg by mouth See admin instructions. Take 50 mg by mouth at bedtime and 50 mg two times a day as needed for pain) 60 tablet 1   No current facility-administered medications for this visit.     Objective: BP 118/82 (BP Location: Right Arm, Patient Position: Sitting, Cuff Size: Normal)   Pulse 97   Temp 99.3 F (37.4 C) (Oral)   Ht 5\' 10"  (1.778 m)   Wt 250 lb 6.4 oz (113.6 kg)   SpO2  95%   BMI 35.93 kg/m  Gen: NAD, resting comfortably CV: RRR no murmurs rubs or gallops Lungs: CTAB no crackles, wheeze, rhonchi Abdomen: soft/nontender/nondistended/normal bowel sounds. No rebound or guarding.  Ext: trace  edema Skin: warm, dry  Assessment/Plan:   CAD -s/p DES to marginal vessel at [facility] Patient doing well at this point. Has had some recurrent chest pain and did not have nitroglycerin refills so rested an symptoms resolved. I refilled his nitroglycerin and he has close follow up with cardiology  Diabetes mellitus due to underlying condition with diabetic autonomic (poly)neuropathy [facility]) Noncompliance is key issue for patient. He has seen Dr. Carlis Abbott ,  [facility], and Dr. Loanne Drilling- currently seeing Dr. Loanne Drilling but asking for change to Dr. Dwyane Dee  CBG came back at over 500 today. Will increase his NPH to 50 units BID from 40 units (see phone note) and have him update [place] on Monday- may need to add mealtime insulin. Hoping we can at least get him under 400 more consistently by Monday. He has to improve his diet- wife is honest that he is eating horribly at home (whereas he needed very little insulin in hospital). Actually minimal insulin needs in hospital prompted hospitalist to change his regimen though it may be changed back when he follows up with Dr. Loanne Drilling next week- I think patient needs to focus on his compliance and take responsibility for his health instead of constantly changing endocrinologists.   Obstructive sleep apnea Continue cpap  Posttraumatic stress disorder [facility]  Hyperlipidemia Continue atorvastatin 40mg   Morbid obesity ([place]) Obesity-counseled on need for weight loss.  Technically this is morbid obesity with BMI above 35 as well as hypertension and diabetes. Down 15 lbs- lost fair amount in hospital- he has decreased his overall intake- enocuraged to continue weight  loss. After visit with finding CBG over 500- suspect he is dehydrated from hyperglycemia and fluid losses resulting.    Future Appointments  Date Time Provider [place]  12/10/2017 10:30 AM Renato Shin, MD [facility] None  12/16/2017  8:30 AM Ahmed Prima Felisa Bonier [facility]  12/21/2017 12:30 PM [facility] 1 - HC [facility]VS  12/21/2017  1:15 PM Early, Arvilla Meres, MD [facility]  01/04/2018  1:50 PM Pieter Partridge, DO [facility] None  02/15/2018  2:45 PM Gardiner Barefoot, DPM [facility]   Lab/Order associations: Essential hypertension - Plan: CBC, Comprehensive metabolic panel  Diabetes mellitus due to underlying condition with diabetic autonomic neuropathy, unspecified whether long term insulin use ([place]) - Plan: Ambulatory referral to Endocrinology  Need for prophylactic vaccination against Streptococcus pneumoniae (pneumococcus) - Plan: Pneumococcal conjugate vaccine 13-valent IM  Coronary artery disease of native artery of native heart with stable angina pectoris ([place])  Obstructive sleep apnea  Posttraumatic stress disorder  Hyperlipidemia, unspecified hyperlipidemia type  Morbid obesity ([place])  Meds ordered this encounter  Medications  . nitroGLYCERIN (NITROSTAT) 0.4 MG SL tablet    Sig: Place 1 tablet (0.4 mg total) under the tongue every 5 (five) minutes as needed for chest pain.    Dispense:  25 tablet    Refill:  0  . omeprazole (PRILOSEC) 20 MG capsule    Sig: Take 1 capsule (20 mg total) by mouth 2 (two) times daily before a meal.    Dispense:  180 capsule    Refill:  3    Return precautions advised.  Garret Reddish, MD

## 2017-12-03 NOTE — Telephone Encounter (Signed)
CRITICAL VALUE STICKER  CRITICAL VALUE: Glucose 514  RECEIVER (on-site recipient of call): [name]  DATE & TIME NOTIFIED: 12/03/2017 & 5:07pm  MESSENGER (representative from lab):  MD NOTIFIED: Yes  TIME OF NOTIFICATION: 5:07pm  RESPONSE:

## 2017-12-03 NOTE — Telephone Encounter (Signed)
Have you seen this paperwork? I will request [facility] fax another one if we do not have it

## 2017-12-03 NOTE — Patient Instructions (Addendum)
Health Maintenance Due  Topic Date Due  . Hepatitis C Screening - Our team will request records from [facility]. 03-06-1951  . TETANUS/TDAP - Our team will requests records from Dr. Lorrene Reid office 11/23/1967  . COLONOSCOPY - Our team will request records from [facility]. 11/23/1998  . PNA vac Low Risk Adult (1 of 2 - PCV13) - Today at office visit 11/22/2013  . OPHTHALMOLOGY EXAM - Patient Undecided of where he wants to get it done. 10/13/2017   I would also like for you to sign up for an annual wellness visit with one of our nurses, Cassie or Manuela Schwartz, who both specialize in the annual wellness visit. This is a free benefit under medicare that may help [place] find additional ways to help you. Some highlights are reviewing medications, lifestyle, and doing a dementia screen.  Would check with your transplant doctors and if they are ok with it--> Please check with your pharmacy to see if they have the shingrix vaccine. If they do- please get this immunization and update [place] by phone call or mychart with dates you receive the vaccine  Call the [place] for tramadol and valium refills  Please stop by lab before you go  Refilled nitroglycerin.   No changes today other than trying to find a better fit for you for endocrinology. Would make sense to cancel visit with Dr. Loanne Drilling as long as you can get a visit sometime soon

## 2017-12-04 NOTE — Assessment & Plan Note (Signed)
Patient doing well at this point. Has had some recurrent chest pain and did not have nitroglycerin refills so rested an symptoms resolved. I refilled his nitroglycerin and he has close follow up with cardiology

## 2017-12-04 NOTE — Assessment & Plan Note (Signed)
Obesity-counseled on need for weight loss.  Technically this is morbid obesity with BMI above 35 as well as hypertension and diabetes. Down 15 lbs- lost fair amount in hospital- he has decreased his overall intake- enocuraged to continue weight loss. After visit with finding CBG over 500- suspect he is dehydrated from hyperglycemia and fluid losses resulting.

## 2017-12-04 NOTE — Assessment & Plan Note (Signed)
[facility] as [facility] have [facility] most of his doses

## 2017-12-04 NOTE — Assessment & Plan Note (Signed)
Continue cpap.  

## 2017-12-04 NOTE — Assessment & Plan Note (Signed)
Noncompliance is key issue for patient. He has seen Dr. Carlis Abbott ,  [facility], and Dr. Loanne Drilling- currently seeing Dr. Loanne Drilling but asking for change to Dr. Dwyane Dee  CBG came back at over 500 today. Will increase his NPH to 50 units BID from 40 units (see phone note) and have him update [place] on Monday- may need to add mealtime insulin. Hoping we can at least get him under 400 more consistently by Monday. He has to improve his diet- wife is honest that he is eating horribly at home (whereas he needed very little insulin in hospital). Actually minimal insulin needs in hospital prompted hospitalist to change his regimen though it may be changed back when he follows up with Dr. Loanne Drilling next week- I think patient needs to focus on his compliance and take responsibility for his health instead of constantly changing endocrinologists.

## 2017-12-04 NOTE — Assessment & Plan Note (Signed)
-

## 2017-12-06 ENCOUNTER — Telehealth: Payer: Self-pay | Admitting: Endocrinology

## 2017-12-06 NOTE — Telephone Encounter (Signed)
I have fax conformation from [facility] sent this morning and left pt vm stating so and if he has any other concerns to please call back

## 2017-12-06 NOTE — Telephone Encounter (Signed)
Patient called re: [employer] who provides patient's glucose meter has not received the final step from Dr. Loanne Drilling even though they have attempted 3 times with no response to get it. All else has been been completed. Please call patient at ph# [phone] to advise patient as to why. He needs it. This is urgent. He has to have others hold the meter, etc due to his arthritis. He was in the hospital recently.

## 2017-12-07 ENCOUNTER — Telehealth: Payer: Self-pay

## 2017-12-07 NOTE — Telephone Encounter (Signed)
This will have to be done in next note.

## 2017-12-07 NOTE — Telephone Encounter (Signed)
Pt next appt is 12/10/17 @10 :30

## 2017-12-07 NOTE — Telephone Encounter (Signed)
Spoke to [facility] case manager Renae [phone]) and she stated that due to medicare guidelines that it must state somewhere in ov how many times pt is testing not how many times you are instructing pt to test

## 2017-12-10 ENCOUNTER — Ambulatory Visit (INDEPENDENT_AMBULATORY_CARE_PROVIDER_SITE_OTHER): Payer: Medicare Other | Admitting: Endocrinology

## 2017-12-10 ENCOUNTER — Encounter: Payer: Self-pay | Admitting: Endocrinology

## 2017-12-10 ENCOUNTER — Telehealth: Payer: Self-pay | Admitting: Endocrinology

## 2017-12-10 VITALS — BP 138/74 | HR 76 | Wt 262.0 lb

## 2017-12-10 DIAGNOSIS — E0843 Diabetes mellitus due to underlying condition with diabetic autonomic (poly)neuropathy: Secondary | ICD-10-CM | POA: Diagnosis not present

## 2017-12-10 DIAGNOSIS — Z794 Long term (current) use of insulin: Secondary | ICD-10-CM | POA: Diagnosis not present

## 2017-12-10 LAB — POCT GLYCOSYLATED HEMOGLOBIN (HGB A1C): Hemoglobin A1C: 10.8 % — AB (ref 4.0–5.6)

## 2017-12-10 MED ORDER — INSULIN NPH (HUMAN) (ISOPHANE) 100 UNIT/ML ~~LOC~~ SUSP: 100.0000 [IU] | Freq: Two times a day (BID) | SUBCUTANEOUS | 11 refills | Status: DC

## 2017-12-10 NOTE — Patient Instructions (Addendum)
check your blood sugar 4 times a day: before the 3 meals, and at bedtime.  also check if you have symptoms of your blood sugar being too high or too low.  please keep a record of the readings and bring it to your next appointment here (or you can bring the meter itself).  You can write it on any piece of paper.  please call us sooner if your blood sugar goes below 70, or if you have a lot of readings over 200.   Please send [place] the blood sugar record to get the [facility] [place] device approved.   Please increase the NPH insulin to 100 units twice a day.   Please call or message [place] next week, to tell us how the blood sugar is doing.   Please come back for a follow-up appointment in 2 months.

## 2017-12-10 NOTE — Progress Notes (Signed)
Subjective:    Patient ID: [name], male    DOB: 1950-06-19, 67 y.o.   MRN: [id number]  HPI Pt returns for f/u of diabetes mellitus: DM type: Insulin-requiring type 2.  Dx'ed: 9622 Complications: polyneuropathy, CVA, CAD, renal failure (transplant 2017), and gastroparesis.  Therapy: insulin since soon after dx.  DKA: never Severe hypoglycemia: 1 episode (approx 1998) Pancreatitis: never Pancreatic imaging: normal on 2014 MRI. Other: he takes QD insulin, after poor results with multiple daily injections; as expected, glycemic control worsened with renal transplant; lantus was changed to NPH, due to pattern of cbg's.   Interval history: In the hospital, insulin was reduced to 10 units BID.  At Dr [facility] office a few days ago, glucose was over 500.  He increased to 80 units BID.  Since the increase, glucose has varies from 250-400.  There is no trend throughout the day.  Pt says he never misses the insulin.   Past Medical History:  Diagnosis Date  . Allergy   . Angina    LAST HAD CP 2 MO AGO  DR W[facility] [place]  . Arthritis   . Backache, unspecified   . Chest pain, unspecified   . CHF (congestive heart failure) ([place])   . Chills   . Chronic kidney disease, stage III (moderate) (HCC)    HD T- TH-SAT  . Complication of anesthesia    01/2011 could not eat,, hospt x2, was placed on hd and cleared up  . Coronary atherosclerosis of native coronary artery   . Diabetes mellitus 2004  . Dizziness   . Dysphagia, unspecified(787.20)   . Gastroparesis   . Headache(784.0)   . Hemorrhage of rectum and anus   . Hyperlipidemia   . Hypertrophy of prostate with urinary obstruction and other lower urinary tract symptoms (LUTS)   . INTERNAL HEMORRHOIDS 10/16/2008   Qualifier: Diagnosis of  By: Nolon Rod CMA (AAMA), Robin    . Internal hemorrhoids without mention of complication   . Myocardial infarction ([place]) 2002  . Nausea alone   . Obesity   . Other dyspnea and respiratory abnormality     . Other malaise and fatigue   . Personal history of unspecified circulatory disease   . Posttraumatic stress disorder   . Sleep apnea    USES CPAP   . Stroke [facility])    2007  . Unspecified essential hypertension    hx htn     Past Surgical History:  Procedure Laterality Date  . ANAL FISSURECTOMY    . AV FISTULA PLACEMENT    . CARDIAC CATHETERIZATION     2005 DR BRODIE  . HEMORRHOID SURGERY    . revision of fistula     renal failure  . VENTRAL HERNIA REPAIR  07/01/2011   Procedure: HERNIA REPAIR VENTRAL ADULT;  Surgeon: Joyice Faster. Cornett, MD;  Location: [facility] OR;  Service: General;  Laterality: N/A;    Social History   Socioeconomic History  . Marital status: Married    Spouse name: Not on file  . Number of children: Not on file  . Years of education: Not on file  . Highest education level: Not on file  Occupational History  . Occupation: disabled    Employer: DISABLED  Social Needs  . Financial resource strain: Not on file  . Food insecurity:    Worry: Not on file    Inability: Not on file  . Transportation needs:    Medical: Not on file    Non-medical: Not on  file  Tobacco Use  . Smoking status: Never Smoker  . Smokeless tobacco: Never Used  Substance and Sexual Activity  . Alcohol use: No  . Drug use: No  . Sexual activity: Not Currently  Lifestyle  . Physical activity:    Days per week: Not on file    Minutes per session: Not on file  . Stress: Not on file  Relationships  . Social connections:    Talks on phone: Not on file    Gets together: Not on file    Attends religious service: Not on file    Active member of club or organization: Not on file    Attends meetings of clubs or organizations: Not on file    Relationship status: Not on file  . Intimate partner violence:    Fear of current or ex partner: Not on file    Emotionally abused: Not on file    Physically abused: Not on file    Forced sexual activity: Not on file  Other Topics Concern  . Not  on file  Social History Narrative   Married 5484. 74 year old son in 74. 1 granddaughter from [place].       Retired from [employer]. 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 acetaminophen (TYLENOL) 500 MG tablet Take 1,000 mg by mouth as needed for mild pain or headache.     . allopurinol (ZYLOPRIM) 100 MG tablet Take 100 mg by mouth 2 (two) times daily.     [name] amLODipine (NORVASC) 10 MG tablet Take 10 mg by mouth daily.    [name] aspirin 81 MG tablet Take 81 mg by mouth at bedtime.     [name] atorvastatin (LIPITOR) 40 MG tablet Take 40 mg by mouth daily.    . clopidogrel (PLAVIX) 75 MG tablet Take 75 mg by mouth daily.     . Continuous Blood Gluc Receiver (FREESTYLE LIBRE 14 DAY READER) DEVI 1 Device by Does not apply route daily. 1 Device 0  . Continuous Blood Gluc Sensor (FREESTYLE LIBRE 14 DAY SENSOR) MISC 1 Device by Does not apply route every 14 (fourteen) days. 3 each 11  . cyclobenzaprine (FLEXERIL) 5 MG tablet Take 0.5-1 tablets (2.5-5 mg total) 3 (three) times daily as needed by mouth for muscle spasms. 30 tablet 0  . diazepam (VALIUM) 5 MG tablet Take 1 tablet (5 mg total) by mouth at bedtime. 30 tablet 0  . furosemide (LASIX) 40 MG tablet Take 1 tablet (40 mg total) by mouth daily. 30 tablet   . gabapentin (NEURONTIN) 100 MG capsule 2 in the am, 2 in the pm, and 2 if needed at night (Patient taking differently: Take 200 mg by mouth See admin instructions. Take 200 mg by mouth in the morning and 200 mg at bedtime SCHEDULED and may take an additional 200 mg during the day AS NEEDED for nerve pain) 180 capsule 3  . glucose blood (ACCU-CHEK AVIVA PLUS) test strip USE AS DIRECTED CHECK BLOOD SUGAR TWICE A DAY 200 each 3  . Insulin Syringe-Needle U-100 (B-D INS SYR ULTRAFINE 1CC/31G) 31G X 5/16" 1 ML MISC  Used to give insulin injections twice daily. 100 each 11  . Magnesium 200 MG TABS Take 200 mg by mouth daily with lunch.     [name]  mycophenolate (MYFORTIC) 180 MG EC tablet Take 180 mg by mouth 2 (two) times daily.    . nitroGLYCERIN (NITROSTAT) 0.4 MG SL tablet Place 1 tablet (0.4 mg total) under the tongue every 5 (five) minutes as needed for chest pain. 25 tablet 0  . omeprazole (PRILOSEC) 20 MG capsule Take 1 capsule (20 mg total) by mouth 2 (two) times daily before a meal. 180 capsule 3  . tacrolimus (PROGRAF) 1 MG capsule     . Tacrolimus 1 MG CP24 Take 4 mg by mouth 2 (two) times daily.     . traMADol (ULTRAM) 50 MG tablet Take 1 tablet (50 mg total) by mouth every 8 (eight) hours as needed for moderate pain or severe pain. (Patient taking differently: Take 50 mg by mouth See admin instructions. Take 50 mg by mouth at bedtime and 50 mg two times a day as needed for pain) 60 tablet 1   No current facility-administered medications on file prior to visit.     No Known Allergies  Family History  Problem Relation Age of Onset  . Heart disease Father   . Renal Disease Father   . Dementia Mother   . Diabetes Sister   . Heart disease Sister   . Diabetes Maternal Aunt   . Diabetes Maternal Uncle   . Diabetes Paternal Aunt   . Diabetes Paternal Uncle   . Heart disease Unknown   . Heart disease Brother     BP 138/74 (BP Location: Left Arm, Patient Position: Sitting, Cuff Size: Normal)   Pulse 76   Wt 262 lb (118.8 kg)   SpO2 96%   BMI 37.59 kg/m    Review of Systems He denies hypoglycemia.     Objective:   Physical Exam VITAL SIGNS:  See vs page.  GENERAL: no distress.  Pulses: foot pulses are intact bilaterally.   MSK: no deformity of the feet or ankles.   CV: 2+ bilat edema of the legs or ankles.   Skin:  no ulcer on the feet or ankles.  normal color and temp on the feet and ankles.   Neuro: sensation is intact to touch on the feet and ankles, but decreased from normal.     Ext: There is bilateral onychomycosis of the toenails.     Lab Results  Component Value Date   HGBA1C 10.8 (A) 12/10/2017   Lab Results  Component Value Date   CREATININE 1.70 (H) 12/03/2017   BUN 20 12/03/2017   NA 131 (L) 12/03/2017   K 4.1 12/03/2017   CL 90 (L) 12/03/2017   CO2 33 (H) 12/03/2017      Assessment & Plan:  Insulin-requiring type 2 DM, with CVA: worse.  Renal failure: he prob needs more am than PM insulin, but first priority is to increase total daily dosage.   Patient Instructions  check your blood sugar 4 times a day: before the 3 meals, and at bedtime.  also check if you have symptoms of your blood sugar being too high or too low.  please keep a record of the readings and bring it to your next appointment here (or you can bring the meter itself).  You can write it on any piece of paper.  please call us sooner if your blood sugar goes below 70, or if you have a lot of readings over 200.   Please send [place] the blood sugar record to get the [facility] device approved.   Please increase the NPH insulin to  100 units twice a day.   Please call or message [place] next week, to tell us how the blood sugar is doing.   Please come back for a follow-up appointment in 2 months.

## 2017-12-10 NOTE — Telephone Encounter (Signed)
[name] is following up with [place] to see if we have received their request for the most recent office notes. They want to make sure that they have the testing frequency on them when sent   FAX- [phone]

## 2017-12-13 NOTE — Telephone Encounter (Signed)
Ok, I closed the note, thanks.

## 2017-12-13 NOTE — Telephone Encounter (Signed)
I see where we are supposed to be sending latest OV notes. It looks like they are incomplete however from 7/19 so I cannot send. Please advise?

## 2017-12-13 NOTE — Telephone Encounter (Signed)
I have faxed to [facility].

## 2017-12-16 ENCOUNTER — Ambulatory Visit: Payer: Medicare Other | Admitting: Physician Assistant

## 2017-12-16 NOTE — Progress Notes (Deleted)
Cardiology Office Note   Date:  12/16/2017   ID:  [name], DOB [birth year], MRN [id number]  PCP:  [name], MD  Cardiologist: Dr. Rich Reining at [place], PA-C   No chief complaint on file.   History of Present Illness: [name] is a 67 y.o. male with a history of morbidly obese AA male with PMH of HTN, CAD s/p multiple PCI including DES OM1 2014, ESRD on HD TTS (followed by Dr. Lorrene Reid), OSA on CPAP, CVA, ascending aortic aneurysm 4.7 cm, and history of right carotid dz  Admitted 6/30-11/23/2017 for chest pain, seen by cardiology and Myoview with possible ischemia, patient and wife decided not to proceed with cath>>Med rx  [name] presents for ***   Past Medical History:  Diagnosis Date  . Allergy   . Angina    LAST HAD CP 2 MO AGO  DR WALL [place]  . Arthritis   . Backache, unspecified   . Chest pain, unspecified   . CHF (congestive heart failure) ([place])   . Chills   . Chronic kidney disease, stage III (moderate) (HCC)    HD T- TH-SAT  . Complication of anesthesia    01/2011 could not eat,, hospt x2, was placed on hd and cleared up  . Coronary atherosclerosis of native coronary artery   . Diabetes mellitus 2004  . Dizziness   . Dysphagia, unspecified(787.20)   . Gastroparesis   . Headache(784.0)   . Hemorrhage of rectum and anus   . Hyperlipidemia   . Hypertrophy of prostate with urinary obstruction and other lower urinary tract symptoms (LUTS)   . INTERNAL HEMORRHOIDS 10/16/2008   Qualifier: Diagnosis of  By: Nolon Rod CMA (AAMA), Robin    . Internal hemorrhoids without mention of complication   . Myocardial infarction ([place]) 2002  . Nausea alone   . Obesity   . Other dyspnea and respiratory abnormality   . Other malaise and fatigue   . Personal history of unspecified circulatory disease   . Posttraumatic stress disorder   . Sleep apnea    USES CPAP   . Stroke [facility])    2007  . Unspecified essential hypertension      hx htn     Past Surgical History:  Procedure Laterality Date  . ANAL FISSURECTOMY    . AV FISTULA PLACEMENT    . CARDIAC CATHETERIZATION     2005 DR BRODIE  . HEMORRHOID SURGERY    . revision of fistula     renal failure  . VENTRAL HERNIA REPAIR  07/01/2011   Procedure: HERNIA REPAIR VENTRAL ADULT;  Surgeon: Joyice Faster. Cornett, MD;  Location: [facility] OR;  Service: General;  Laterality: N/A;    Current Outpatient Medications  Medication Sig Dispense Refill  . acetaminophen (TYLENOL) 500 MG tablet Take 1,000 mg by mouth as needed for mild pain or headache.     . allopurinol (ZYLOPRIM) 100 MG tablet Take 100 mg by mouth 2 (two) times daily.     [name] amLODipine (NORVASC) 10 MG tablet Take 10 mg by mouth daily.    [name] aspirin 81 MG tablet Take 81 mg by mouth at bedtime.     [name] atorvastatin (LIPITOR) 40 MG tablet Take 40 mg by mouth daily.    . clopidogrel (PLAVIX) 75 MG tablet Take 75 mg by mouth daily.     . Continuous Blood Gluc Receiver (FREESTYLE LIBRE 14 DAY READER) DEVI 1 Device by Does not apply route daily.  1 Device 0  . Continuous Blood Gluc Sensor (FREESTYLE LIBRE 14 DAY SENSOR) MISC 1 Device by Does not apply route every 14 (fourteen) days. 3 each 11  . cyclobenzaprine (FLEXERIL) 5 MG tablet Take 0.5-1 tablets (2.5-5 mg total) 3 (three) times daily as needed by mouth for muscle spasms. 30 tablet 0  . diazepam (VALIUM) 5 MG tablet Take 1 tablet (5 mg total) by mouth at bedtime. 30 tablet 0  . furosemide (LASIX) 40 MG tablet Take 1 tablet (40 mg total) by mouth daily. 30 tablet   . gabapentin (NEURONTIN) 100 MG capsule 2 in the am, 2 in the pm, and 2 if needed at night (Patient taking differently: Take 200 mg by mouth See admin instructions. Take 200 mg by mouth in the morning and 200 mg at bedtime SCHEDULED and may take an additional 200 mg during the day AS NEEDED for nerve pain) 180 capsule 3  . glucose blood (ACCU-CHEK AVIVA PLUS) test strip USE AS DIRECTED CHECK BLOOD SUGAR TWICE A DAY  200 each 3  . insulin NPH Human (HUMULIN N,NOVOLIN N) 100 UNIT/ML injection Inject 1 mL (100 Units total) into the skin 2 (two) times daily. And syringes 2/day 200 mL 11  . Insulin Syringe-Needle U-100 (B-D INS SYR ULTRAFINE 1CC/31G) 31G X 5/16" 1 ML MISC Used to give insulin injections twice daily. 100 each 11  . Magnesium 200 MG TABS Take 200 mg by mouth daily with lunch.     . mycophenolate (MYFORTIC) 180 MG EC tablet Take 180 mg by mouth 2 (two) times daily.    . nitroGLYCERIN (NITROSTAT) 0.4 MG SL tablet Place 1 tablet (0.4 mg total) under the tongue every 5 (five) minutes as needed for chest pain. 25 tablet 0  . omeprazole (PRILOSEC) 20 MG capsule Take 1 capsule (20 mg total) by mouth 2 (two) times daily before a meal. 180 capsule 3  . tacrolimus (PROGRAF) 1 MG capsule     . Tacrolimus 1 MG CP24 Take 4 mg by mouth 2 (two) times daily.     . traMADol (ULTRAM) 50 MG tablet Take 1 tablet (50 mg total) by mouth every 8 (eight) hours as needed for moderate pain or severe pain. (Patient taking differently: Take 50 mg by mouth See admin instructions. Take 50 mg by mouth at bedtime and 50 mg two times a day as needed for pain) 60 tablet 1   No current facility-administered medications for this visit.     Allergies:   Patient has no known allergies.    Social History:  The patient  reports that he has never smoked. He has never used smokeless tobacco. He reports that he does not drink alcohol or use drugs.   Family History:  The patient's family history includes Dementia in his mother; Diabetes in his maternal aunt, maternal uncle, paternal aunt, paternal uncle, and sister; Heart disease in his brother, father, sister, and unknown relative; Renal Disease in his father.    ROS:  Please see the history of present illness. All other systems are reviewed and negative.    PHYSICAL EXAM: VS:  There were no vitals taken for this visit. , BMI There is no height or weight on file to calculate  BMI. GEN: Well nourished, well developed, male in no acute distress  HEENT: normal for age  Neck: no JVD, no carotid bruit, no masses Cardiac: RRR; no murmur, no rubs, or gallops Respiratory:  clear to auscultation bilaterally, normal work of breathing GI:  soft, nontender, nondistended, + BS MS: no deformity or atrophy; no edema; distal pulses are 2+ in all 4 extremities   Skin: warm and dry, no rash Neuro:  Strength and sensation are intact Psych: euthymic mood, full affect   EKG:  EKG {ACTION; IS/IS LNL:[id number]} ordered today. The ekg ordered today demonstrates ***  MYOVIEW: 11/23/2017 FINDINGS: Perfusion: There is decreased activity in the inferolateral wall on stress images concerning for inducible ischemia. No fixed defect to suggest infarct.  Wall Motion: Normal left ventricular wall motion. No left ventricular dilation.  Left Ventricular Ejection Fraction: 54 %  End diastolic volume 740 ml  End systolic volume 50 ml  IMPRESSION: 1. Moderate-sized area of decreased activity on stress images in the inferolateral wall concerning for ischemia.  2. Normal left ventricular wall motion.  3. Left ventricular ejection fraction 54%  4. Non invasive risk stratification*: Intermediate  Recent Labs: 01/19/2017: B Natriuretic Peptide 23.9 11/23/2017: Magnesium 2.1 12/03/2017: ALT 16; BUN 20; Creatinine, Ser 1.70; Hemoglobin 14.2; Platelets 274.0; Potassium 4.1; Sodium 131    Lipid Panel    Component Value Date/Time   CHOL 198 02/16/2017   TRIG 333 (A) 02/16/2017   TRIG 373 (HH) 04/27/2006 1123   HDL 46 02/16/2017   CHOLHDL 5 07/29/2011 1322   VLDL 44.6 (H) 07/29/2011 1322   LDLCALC 85 02/16/2017   LDLDIRECT 159.2 07/29/2011 1322     Wt Readings from Last 3 Encounters:  12/10/17 262 lb (118.8 kg)  12/03/17 250 lb 6.4 oz (113.6 kg)  11/22/17 251 lb (113.9 kg)     Other studies Reviewed: Additional studies/ records that were reviewed today include:  ***.  ASSESSMENT AND PLAN:  1.  ***   Current medicines are reviewed at length with the patient today.  The patient {ACTIONS; HAS/DOES NOT HAVE:19233} concerns regarding medicines.  The following changes have been made:  {PLAN; NO CHANGE:13088:s}  Labs/ tests ordered today include: *** No orders of the defined types were placed in this encounter.    Disposition:   FU with ***  Signed, Rosaria Ferries, PA-C  12/16/2017 8:04 AM    [place] Phone: [phone]; Fax: [phone]  This note was written with the assistance of speech recognition software. Please excuse any transcriptional errors. [name]

## 2017-12-17 ENCOUNTER — Encounter: Payer: Self-pay | Admitting: *Deleted

## 2017-12-20 ENCOUNTER — Encounter: Payer: Self-pay | Admitting: Family Medicine

## 2017-12-21 ENCOUNTER — Encounter: Payer: Self-pay | Admitting: Vascular Surgery

## 2017-12-21 ENCOUNTER — Ambulatory Visit (INDEPENDENT_AMBULATORY_CARE_PROVIDER_SITE_OTHER): Payer: Medicare Other | Admitting: Vascular Surgery

## 2017-12-21 ENCOUNTER — Telehealth: Payer: Self-pay | Admitting: Endocrinology

## 2017-12-21 ENCOUNTER — Ambulatory Visit (HOSPITAL_COMMUNITY)
Admission: RE | Admit: 2017-12-21 | Discharge: 2017-12-21 | Disposition: A | Discharge status: Home / Self Care | Payer: Medicare Other | Source: Ambulatory Visit | Attending: Vascular Surgery | Admitting: Vascular Surgery

## 2017-12-21 VITALS — BP 143/77 | HR 82 | Temp 98.1°F | Resp 16 | Ht 70.5 in | Wt 251.0 lb

## 2017-12-21 DIAGNOSIS — R0989 Other specified symptoms and signs involving the circulatory and respiratory systems: Secondary | ICD-10-CM | POA: Diagnosis present

## 2017-12-21 DIAGNOSIS — I70202 Unspecified atherosclerosis of native arteries of extremities, left leg: Secondary | ICD-10-CM | POA: Insufficient documentation

## 2017-12-21 DIAGNOSIS — I739 Peripheral vascular disease, unspecified: Secondary | ICD-10-CM

## 2017-12-21 DIAGNOSIS — R6889 Other general symptoms and signs: Secondary | ICD-10-CM | POA: Insufficient documentation

## 2017-12-21 NOTE — Telephone Encounter (Signed)
I spoke with patient & stated that we had not received blood sugars. He said that he had received a fax confirmation that they went through, but it was 23 pages. He said that he would just drop them by to have them put in my box. [place] evidently needs proof he testing four times a day.

## 2017-12-21 NOTE — Progress Notes (Signed)
[facility] [place]  Patient name: [name] MRN: [id number] DOB: Mar 24, 1951 Sex: male  REASON FOR CONSULT: Evaluate left leg pain and abnormal ankle arm index  HPI: [name] is a 67 y.o. male, who is here today for evaluation of left leg pain.  He is known to me from a prior left upper arm AV creation dialysis in 2012.  The purpose patient reports that he was on hemodialysis 5 years and then received a kidney transplant and is been off dialysis for over 2 years.  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  He has several components of leg discomfort.  He does report degenerative disc disease and also reports bilateral lower extremity peripheral neuropathy.  He said his pain is not related to activity.  He describes the pain from his knee down the medial aspect to his ankle.  He does not have any difficulty in his left leg.  He does not have any calf claudication type symptoms and does not have any resting pain in his feet bilaterally.  He does not have any tissue loss but does note swelling and skin color changes on his left leg versus his right.  Does have a history of congestive heart failure and is a long-term diabetic  Past Medical History:  Diagnosis Date  . Allergy   . Angina    LAST HAD CP 2 MO AGO  DR [facility] [place]  . Arthritis   . Backache, unspecified   . Chest pain, unspecified   . CHF (congestive heart failure) ([place])   . Chills   . Chronic kidney disease, stage III (moderate) (HCC)    HD T- TH-SAT  . Complication of anesthesia    01/2011 could not eat,, hospt x2, was placed on hd and cleared up  . Coronary atherosclerosis of native coronary artery   . Diabetes mellitus 2004  . Dizziness   . Dysphagia, unspecified(787.20)   . Gastroparesis   . Headache(784.0)   . Hemorrhage of rectum and anus   . Hyperlipidemia    . Hypertrophy of prostate with urinary obstruction and other lower urinary tract symptoms (LUTS)   . INTERNAL HEMORRHOIDS 10/16/2008   Qualifier: Diagnosis of  By: Nolon Rod CMA (AAMA), Robin    . Internal hemorrhoids without mention of complication   . Myocardial infarction ([place]) 2002  . Nausea alone   . Obesity   . Other dyspnea and respiratory abnormality   . Other malaise and fatigue   . Personal history of unspecified circulatory disease   . Posttraumatic stress disorder   . Sleep apnea    USES CPAP   . Stroke [facility])    2007  . Unspecified essential hypertension    hx htn     Family History  Problem Relation Age of Onset  . Heart disease Father   . Renal Disease Father   . Dementia Mother   . Diabetes Sister   . Heart disease Sister   . Diabetes Maternal Aunt   . Diabetes Maternal Uncle   . Diabetes Paternal Aunt   . Diabetes Paternal Uncle   . Heart disease Unknown   . Heart disease Brother     SOCIAL HISTORY: Social History   Socioeconomic History  . Marital status: Married    Spouse name: Not on file  . Number of children: Not on file  . Years of education: Not on file  .  Highest education level: Not on file  Occupational History  . Occupation: disabled    Employer: DISABLED  Social Needs  . Financial resource strain: Not on file  . Food insecurity:    Worry: Not on file    Inability: Not on file  . Transportation needs:    Medical: Not on file    Non-medical: Not on file  Tobacco Use  . Smoking status: Never Smoker  . Smokeless tobacco: Never Used  Substance and Sexual Activity  . Alcohol use: No  . Drug use: No  . Sexual activity: Not Currently  Lifestyle  . Physical activity:    Days per week: Not on file    Minutes per session: Not on file  . Stress: Not on file  Relationships  . Social connections:    Talks on phone: Not on file    Gets together: Not on file    Attends religious service: Not on file    Active member of club or  organization: Not on file    Attends meetings of clubs or organizations: Not on file    Relationship status: Not on file  . Intimate partner violence:    Fear of current or ex partner: Not on file    Emotionally abused: Not on file    Physically abused: Not on file    Forced sexual activity: Not on file  Other Topics Concern  . Not on file  Social History Narrative   Married 2956. 30 year old son in 89. 1 granddaughter from [place].       Retired from [employer]. 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 acetaminophen (TYLENOL) 500 MG tablet Take 1,000 mg by mouth as needed for mild pain or headache.     . allopurinol (ZYLOPRIM) 100 MG tablet Take 100 mg by mouth 2 (two) times daily.     [name] amLODipine (NORVASC) 10 MG tablet Take 10 mg by mouth daily.    [name] aspirin 81 MG tablet Take 81 mg by mouth at bedtime.     [name] atorvastatin (LIPITOR) 40 MG tablet Take 40 mg by mouth daily.    . clopidogrel (PLAVIX) 75 MG tablet Take 75 mg by mouth daily.     . Continuous Blood Gluc Receiver (FREESTYLE LIBRE 14 DAY READER) DEVI 1 Device by Does not apply route daily. 1 Device 0  . Continuous Blood Gluc Sensor (FREESTYLE LIBRE 14 DAY SENSOR) MISC 1 Device by Does not apply route every 14 (fourteen) days. 3 each 11  . cyclobenzaprine (FLEXERIL) 5 MG tablet Take 0.5-1 tablets (2.5-5 mg total) 3 (three) times daily as needed by mouth for muscle spasms. 30 tablet 0  . diazepam (VALIUM) 5 MG tablet Take 1 tablet (5 mg total) by mouth at bedtime. 30 tablet 0  . furosemide (LASIX) 40 MG tablet Take 1 tablet (40 mg total) by mouth daily. 30 tablet   . gabapentin (NEURONTIN) 100 MG capsule 2 in the am, 2 in the pm, and 2 if needed at night (Patient taking differently: Take 200 mg by mouth See admin instructions. Take 200 mg  by mouth in the morning and 200 mg at bedtime SCHEDULED and may take an additional 200 mg during the day AS NEEDED for nerve pain) 180 capsule 3  . glucose  blood (ACCU-CHEK AVIVA PLUS) test strip USE AS DIRECTED CHECK BLOOD SUGAR TWICE A DAY 200 each 3  . insulin NPH Human (HUMULIN N,NOVOLIN N) 100 UNIT/ML injection Inject 1 mL (100 Units total) into the skin 2 (two) times daily. And syringes 2/day 200 mL 11  . Insulin Syringe-Needle U-100 (B-D INS SYR ULTRAFINE 1CC/31G) 31G X 5/16" 1 ML MISC Used to give insulin injections twice daily. 100 each 11  . Magnesium 200 MG TABS Take 200 mg by mouth daily with lunch.     . mycophenolate (MYFORTIC) 180 MG EC tablet Take 180 mg by mouth 2 (two) times daily.    . nitroGLYCERIN (NITROSTAT) 0.4 MG SL tablet Place 1 tablet (0.4 mg total) under the tongue every 5 (five) minutes as needed for chest pain. 25 tablet 0  . omeprazole (PRILOSEC) 20 MG capsule Take 1 capsule (20 mg total) by mouth 2 (two) times daily before a meal. 180 capsule 3  . tacrolimus (PROGRAF) 1 MG capsule     . Tacrolimus 1 MG CP24 Take 4 mg by mouth 2 (two) times daily.     . traMADol (ULTRAM) 50 MG tablet Take 1 tablet (50 mg total) by mouth every 8 (eight) hours as needed for moderate pain or severe pain. (Patient taking differently: Take 50 mg by mouth See admin instructions. Take 50 mg by mouth at bedtime and 50 mg two times a day as needed for pain) 60 tablet 1   No current facility-administered medications for this visit.     REVIEW OF SYSTEMS:  [X]  denotes positive finding, [ ]  denotes negative finding Cardiac  Comments:  Chest pain or chest pressure: x   Shortness of breath upon exertion: x   Short of breath when lying flat:    Irregular heart rhythm:        Vascular    Pain in calf, thigh, or hip brought on by ambulation:    Pain in feet at night that wakes you up from your sleep:  x   Blood clot in your veins: x   Leg swelling:  x       Pulmonary    Oxygen at home:      Productive cough:     Wheezing:         Neurologic    Sudden weakness in arms or legs:  x   Sudden numbness in arms or legs:  x   Sudden onset of difficulty speaking or slurred speech:    Temporary loss of vision in one eye:     Problems with dizziness:  x       Gastrointestinal    Blood in stool:     Vomited blood:         Genitourinary    Burning when urinating:     Blood in urine:        Psychiatric    Major depression:         Hematologic    Bleeding problems:    Problems with blood clotting too easily:        Skin    Rashes or ulcers:        Constitutional    Fever or chills:      PHYSICAL EXAM: Vitals:   12/21/17 1337  BP: (!) 143/77  Pulse: 82  Resp: 16  Temp: 98.1 F (36.7 C)  SpO2: 98%  Weight: 251 lb (113.9 kg)  Height: 5' 10.5" (1.791 m)    GENERAL: The patient is  a well-nourished male, in no acute distress. The vital signs are documented above. CARDIOVASCULAR: Easily palpable radial pulses bilaterally.  Carotid arteries without bruits.  I do not palpate pedal pulses bilaterally PULMONARY: There is good air exchange  ABDOMEN: Soft and non-tender obese. MUSCULOSKELETAL: There are no major deformities or cyanosis. NEUROLOGIC: No focal weakness or paresthesias are detected. SKIN: There are no ulcers or rashes noted.  Does have thickening and hemosiderin deposit mainly on the left medial ankle PSYCHIATRIC: The patient has a normal affect.  DATA:  Noninvasive studies at [facility] showed normal ankle arm index on the right and 0.72 on the left  Lower extremity arterial duplex in our office today reveals 2 areas of stenosis in the superficial femoral artery.  MEDICAL ISSUES: Had long discussion with the patient.  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  I do not feel that his symptoms are related to arterial insufficiency.  He does not have any pain at exercise and his pain at  rest is from his knee distally which would not be consistent with arterial rest pain.  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  He was reassured with these discussion will see [place] again on an as-needed basis   Rosetta Posner, MD [facility] Tel [phone] Pager [phone]

## 2017-12-21 NOTE — Telephone Encounter (Signed)
Patient faxed blood sugar readings to [place] from 09/22/17 through 12/11/17. Please call patient at ph# [phone] to verify fax has been received and did you contact the insurance company for meter?

## 2017-12-23 DIAGNOSIS — Z940 Kidney transplant status: Secondary | ICD-10-CM | POA: Diagnosis not present

## 2017-12-23 DIAGNOSIS — M109 Gout, unspecified: Secondary | ICD-10-CM | POA: Diagnosis not present

## 2017-12-23 DIAGNOSIS — N2581 Secondary hyperparathyroidism of renal origin: Secondary | ICD-10-CM | POA: Diagnosis not present

## 2017-12-23 DIAGNOSIS — E1129 Type 2 diabetes mellitus with other diabetic kidney complication: Secondary | ICD-10-CM | POA: Diagnosis not present

## 2017-12-23 DIAGNOSIS — I129 Hypertensive chronic kidney disease with stage 1 through stage 4 chronic kidney disease, or unspecified chronic kidney disease: Secondary | ICD-10-CM | POA: Diagnosis not present

## 2017-12-24 ENCOUNTER — Other Ambulatory Visit: Payer: Self-pay

## 2017-12-24 DIAGNOSIS — I129 Hypertensive chronic kidney disease with stage 1 through stage 4 chronic kidney disease, or unspecified chronic kidney disease: Secondary | ICD-10-CM | POA: Diagnosis not present

## 2017-12-24 DIAGNOSIS — N2581 Secondary hyperparathyroidism of renal origin: Secondary | ICD-10-CM | POA: Diagnosis not present

## 2017-12-24 DIAGNOSIS — Z940 Kidney transplant status: Secondary | ICD-10-CM | POA: Diagnosis not present

## 2017-12-24 MED ORDER — NITROGLYCERIN 0.4 MG SL SUBL: 0.4000 mg | SUBLINGUAL_TABLET | SUBLINGUAL | 0 refills | Status: DC | PRN

## 2017-12-28 ENCOUNTER — Ambulatory Visit: Payer: Medicare Other | Admitting: Physician Assistant

## 2017-12-28 ENCOUNTER — Encounter: Payer: Self-pay | Admitting: Physician Assistant

## 2017-12-28 VITALS — BP 132/72 | HR 79 | Ht 70.5 in | Wt 260.8 lb

## 2017-12-28 DIAGNOSIS — I739 Peripheral vascular disease, unspecified: Secondary | ICD-10-CM | POA: Diagnosis not present

## 2017-12-28 DIAGNOSIS — Z940 Kidney transplant status: Secondary | ICD-10-CM

## 2017-12-28 DIAGNOSIS — E785 Hyperlipidemia, unspecified: Secondary | ICD-10-CM

## 2017-12-28 DIAGNOSIS — I10 Essential (primary) hypertension: Secondary | ICD-10-CM | POA: Diagnosis not present

## 2017-12-28 DIAGNOSIS — I25118 Atherosclerotic heart disease of native coronary artery with other forms of angina pectoris: Secondary | ICD-10-CM | POA: Diagnosis not present

## 2017-12-28 NOTE — Patient Instructions (Addendum)
Medication Instructions:   Your physician recommends that you continue on your current medications as directed. Please refer to the Current Medication list given to you today.   If you need a refill on your cardiac medications before your next appointment, please call your pharmacy.  Labwork: NONE ORDERED  TODAY    Testing/Procedures: NONE ORDERED  TODAY    Follow-Up:  IN 2 TO 3 MONTHS WITH CRENSHAW   Any Other Special Instructions Will Be Listed Below (If Applicable).  [id number]

## 2017-12-28 NOTE — Progress Notes (Signed)
Cardiology Office Note   Date:  12/28/2017   ID:  [name], DOB 1950-12-22, MRN [id number]  PCP:  [name], MD  Cardiologist:  [facility] >>Dr Alcide Evener, PA-C   Chief Complaint  Patient presents with  . Hospitalization Follow-up    denies current chest pains, when doing activites SOB noted, swelling in legs/feet on fluid pill    History of Present Illness: [name] is a 67 y.o. male with a history of DES OM 2014, CVA, DM, LUE AVF (not in use), CKD with renal transplant 2016, thoracic AA 4.7 cm  Admitted 6/30-11/23/2017 for chest pain, Myoview was abnormal, patient prefers medical therapy versus cath, left lower extremity with significant PAD 7/30 office visit with Dr. Donnetta Hutching, PAD not felt limb threatening and no clear claudication symptoms, medical therapy  [name] presents for cardiology follow up.   He is aware of the elevated A1c, has made dietary changes.   Home weights are running between 250 & 256. He retains fluid in his abdomen and some in his legs. He sees Dr Lorrene Reid every month.   He drinks 3 - 1/2 L bottles of water, plus 3- 8 oz cups of coffee a day. He is reluctant to give up the coffee.   He is on CPAP, but has a hole in the tubing, so has not been using it this week.  Replacement tubing is ordered.  He has had chest pain multiple times since discharge. He gets it regularly with exertion, but not generally when walking on flat ground.  He has not used nitroglycerin.  He walks a mile with his son every week. He does not otherwise exercise. He does not go up stairs. Does not do anything strenuous.   He overdid it just before his admission. Feels that is what gave him the symptoms.   [facility] Feels he needs to get back in the water.  Says he previously did well with water exercise.  Still has problems w/ orthostatic hypotension.  Has worked w/ PT and does not bend over to tie shoes.     Past Medical History:  Diagnosis Date  . Allergy   . Angina   . Arthritis   . Backache, unspecified   . CHF (congestive heart failure) ([place])   . Chills   . Chronic kidney disease, stage III (moderate) (HCC)    HD T- TH-SAT  . Complication of anesthesia    01/2011 could not eat,, hospt x2, was placed on hd and cleared up  . Coronary atherosclerosis of native coronary artery   . Diabetes mellitus 2004  . Dizziness   . Dysphagia, unspecified(787.20)   . Gastroparesis   . Headache(784.0)   . Hemorrhage of rectum and anus   . Hyperlipidemia   . Hypertrophy of prostate with urinary obstruction and other lower urinary tract symptoms (LUTS)   . INTERNAL HEMORRHOIDS 10/16/2008   Qualifier: Diagnosis of  By: Nolon Rod CMA (AAMA), Robin    . Internal hemorrhoids without mention of complication   . Myocardial infarction ([place]) 2002  . Nausea alone   . Obesity   . Other dyspnea and respiratory abnormality   . Other malaise and fatigue   . Personal history of unspecified circulatory disease   . Posttraumatic stress disorder   . Sleep apnea    USES CPAP   . Stroke [facility])    2007  . Unspecified essential hypertension  hx htn     Past Surgical History:  Procedure Laterality Date  . ANAL FISSURECTOMY    . AV FISTULA PLACEMENT    . CARDIAC CATHETERIZATION     2005 DR BRODIE  . HEMORRHOID SURGERY    . revision of fistula     renal failure  . VENTRAL HERNIA REPAIR  07/01/2011   Procedure: HERNIA REPAIR VENTRAL ADULT;  Surgeon: Joyice Faster. Cornett, MD;  Location: [facility];  Service: General;  Laterality: N/A;    Current Outpatient Medications  Medication Sig Dispense Refill  . acetaminophen (TYLENOL) 500 MG tablet Take 1,000 mg by mouth as needed for mild pain or headache.     . allopurinol (ZYLOPRIM) 100 MG tablet Take 100 mg by mouth 2 (two) times daily.     [name] amLODipine (NORVASC) 10 MG tablet Take 10 mg by mouth daily.    [name] aspirin 81 MG tablet Take 81 mg by mouth at bedtime.      [name] atorvastatin (LIPITOR) 40 MG tablet Take 40 mg by mouth daily.    . clopidogrel (PLAVIX) 75 MG tablet Take 75 mg by mouth daily.     . diazepam (VALIUM) 5 MG tablet Take 1 tablet (5 mg total) by mouth at bedtime. 30 tablet 0  . furosemide (LASIX) 40 MG tablet Take 1 tablet (40 mg total) by mouth daily. (Patient taking differently: Take 20 mg by mouth daily. ) 30 tablet   . gabapentin (NEURONTIN) 100 MG capsule 2 in the am, 2 in the pm, and 2 if needed at night (Patient taking differently: Take 200 mg by mouth See admin instructions. Take 200 mg by mouth in the morning and 200 mg at bedtime SCHEDULED and may take an additional 200 mg during the day AS NEEDED for nerve pain) 180 capsule 3  . glucose blood (ACCU-CHEK AVIVA PLUS) test strip USE AS DIRECTED CHECK BLOOD SUGAR TWICE A DAY 200 each 3  . insulin lispro (HUMALOG) 100 UNIT/ML injection Inject into the skin 3 (three) times daily before meals.    . insulin NPH Human (HUMULIN N,NOVOLIN N) 100 UNIT/ML injection Inject 1 mL (100 Units total) into the skin 2 (two) times daily. And syringes 2/day 200 mL 11  . Insulin Syringe-Needle U-100 (B-D INS SYR ULTRAFINE 1CC/31G) 31G X 5/16" 1 ML MISC Used to give insulin injections twice daily. 100 each 11  . Magnesium 200 MG TABS Take 200 mg by mouth daily with lunch.     . mycophenolate (MYFORTIC) 180 MG EC tablet Take 180 mg by mouth 2 (two) times daily.    . nitroGLYCERIN (NITROSTAT) 0.4 MG SL tablet Place 1 tablet (0.4 mg total) under the tongue every 5 (five) minutes as needed for chest pain. 25 tablet 0  . omeprazole (PRILOSEC) 20 MG capsule Take 1 capsule (20 mg total) by mouth 2 (two) times daily before a meal. 180 capsule 3  . tacrolimus (PROGRAF) 1 MG capsule     . Tacrolimus 1 MG CP24 Take 4 mg by mouth 2 (two) times daily.     . traMADol (ULTRAM) 50 MG tablet Take 1 tablet (50 mg total) by mouth every 8 (eight) hours as needed for moderate pain or severe pain. (Patient taking differently: Take  50 mg by mouth See admin instructions. Take 50 mg by mouth at bedtime and 50 mg two times a day as needed for pain) 60 tablet 1  . Continuous Blood Gluc Receiver (FREESTYLE LIBRE 14 DAY READER) DEVI 1  Device by Does not apply route daily. (Patient not taking: Reported on 12/28/2017) 1 Device 0  . Continuous Blood Gluc Sensor (FREESTYLE LIBRE 14 DAY SENSOR) MISC 1 Device by Does not apply route every 14 (fourteen) days. (Patient not taking: Reported on 12/28/2017) 3 each 11  . cyclobenzaprine (FLEXERIL) 5 MG tablet Take 0.5-1 tablets (2.5-5 mg total) 3 (three) times daily as needed by mouth for muscle spasms. (Patient not taking: Reported on 12/28/2017) 30 tablet 0   No current facility-administered medications for this visit.     Allergies:   Patient has no known allergies.    Social History:  The patient  reports that he has never smoked. He has never used smokeless tobacco. He reports that he does not drink alcohol or use drugs.   Family History:  The patient's family history includes Dementia in his mother; Diabetes in his maternal aunt, maternal uncle, paternal aunt, paternal uncle, and sister; Heart disease in his brother, father, sister, and unknown relative; Renal Disease in his father.    ROS:  Please see the history of present illness. All other systems are reviewed and negative.    PHYSICAL EXAM: VS:  BP 132/72 (BP Location: Right Arm, Patient Position: Sitting)   Pulse 79   Ht 5' 10.5" (1.791 m)   Wt 260 lb 12.8 oz (118.3 kg)   SpO2 96%   BMI 36.89 kg/m  , BMI Body mass index is 36.89 kg/m. GEN: Well nourished, well developed, male in no acute distress  HEENT: normal for age  Neck: no JVD, no carotid bruit, no masses Cardiac: RRR; soft murmur, no rubs, or gallops Respiratory:  clear to auscultation bilaterally, normal work of breathing GI: soft, nontender, nondistended, + BS MS: no deformity or atrophy; no edema; distal pulses are 2+ in upper extremities, decreased pulse in  right lower extremity, barely palpable pulse left lower extremity capillary refill borderline delayed Skin: warm and dry, no rash  Neuro:  Strength and sensation are intact Psych: euthymic mood, full affect   EKG:  EKG is not ordered today.  MYOVIEW: 11/23/2017 FINDINGS: Perfusion: There is decreased activity in the inferolateral wall on stress images concerning for inducible ischemia. No fixed defect to suggest infarct.  Wall Motion: Normal left ventricular wall motion. No left ventricular dilation.  Left Ventricular Ejection Fraction: 54 %  End diastolic volume 099 ml  End systolic volume 50 ml  IMPRESSION: 1. Moderate-sized area of decreased activity on stress images in the inferolateral wall concerning for ischemia.  2. Normal left ventricular wall motion.  3. Left ventricular ejection fraction 54%  4. Non invasive risk stratification*: Intermediate  ECHO: 11/22/2017 - Left ventricle: The cavity size was normal. Wall thickness was   increased in a pattern of severe LVH. Systolic function was   normal. The estimated ejection fraction was in the range of 60%   to 65%. Wall motion was normal; there were no regional wall   motion abnormalities. Features are consistent with a pseudonormal   left ventricular filling pattern, with concomitant abnormal   relaxation and increased filling pressure (grade 2 diastolic   dysfunction). - Aortic valve: There was trivial regurgitation.  LLE Doppler: 12/21/2017 Final Interpretation: Left: Two areas of 50-74% stenosis in the proximal and proximal-mid SFA. The left distal ATA is severely diminished.   Recent Labs: 01/19/2017: B Natriuretic Peptide 23.9 11/23/2017: Magnesium 2.1 12/03/2017: ALT 16; BUN 20; Creatinine, Ser 1.70; Hemoglobin 14.2; Platelets 274.0; Potassium 4.1; Sodium 131  Lab Results  Component Value Date   HGBA1C 10.8 (A) 12/10/2017   Lipid Panel From [facility] Cholesterol, total 127.000  07/01/2017 HDL 44.000   07/01/2017 LDL 85.000 02/16/2017 Triglycerides 136.000  07/01/2017     Component Value Date/Time   CHOL 198 02/16/2017   TRIG 333 (A) 02/16/2017   TRIG 373 (HH) 04/27/2006 1123   HDL 46 02/16/2017   CHOLHDL 5 07/29/2011 1322   VLDL 44.6 (H) 07/29/2011 1322   LDLCALC 85 02/16/2017   LDLDIRECT 159.2 07/29/2011 1322     Wt Readings from Last 3 Encounters:  12/28/17 260 lb 12.8 oz (118.3 kg)  12/21/17 251 lb (113.9 kg)  12/10/17 262 lb (118.8 kg)     Other studies Reviewed: Additional studies/ records that were reviewed today include: Office notes, hospital records and testing.  ASSESSMENT AND PLAN: The patient was reviewed with Dr. Stanford Breed who agrees with the plan.  1.  CAD: At the time of his cath in 2014, he had a nondominant small RCA with 95% stenosis that was treated medically.  A Myoview during his recent admission showed ischemia in the inferolateral wall. - He is currently on aspirin and Plavix, amlodipine 10 mg, also with nitroglycerin. -Upon reviewing the [facility] records, in 2016, he was on metoprolol 25 mg twice daily at one point, but was only taking 1/2 tablet daily.  Beta-blocker was not on his 2017 medication list.  I will not add one at this time. -Because of his orthostatic hypotension, I will not add isosorbide. -He is encouraged to increase his activity as tolerated. - he was advised that if his symptoms are not controlled, cardiac catheterization can be discussed.  However, we will get Dr. Sanda Klein input before doing any dye studies.  2.  End-stage renal disease, status post renal transplant: -Follow-up with Dr. Lorrene Reid as scheduled. -He has had some problems intermittently with lower extremity edema. -She is currently on Lasix 40 mg a day. - I encouraged him to be aware of the amount of fluid that he drinks, it is okay to drink 3 bottles of water, but if he could cut back a little on the coffee, that would be good.  He says he is drinking coffee to keep from eating  sweet desserts.  3.  Diabetes: He is aware that his recent recent hemoglobin A1c was 10.8. -He states his medications were manipulated after the kidney transplant because of concerns for medication interactions with the antirejection drugs and/or or the possibility of the medications worsening his kidney function. -He states his A1c has been high ever since then.  Of note, 1 of the [facility] from [facility] states that he tends to eat anything he wants. -Management per PCP.  4.  PAD: He was reassured by Dr. Luther Parody evaluation.  He is not currently having claudication symptoms.  He is encouraged to increase his activity as tolerated and not exercise just once a week.  5.  Hypertension: Good control on current medications, no changes.  6.  Hyperlipidemia, goal LDL < 70: His lipid profile is greatly improved on Lipitor 40 mg daily.  Dr. Yong Channel follows this, he is due for recheck soon. -Goal LDL is less than 70, if he is still above this, Dr. Yong Channel to consider a dose increase in the Lipitor.   Current medicines are reviewed at length with the patient today.  The patient does not have concerns regarding medicines.  The following changes have been made:  no change  Labs/ tests ordered today include:  No orders  of the defined types were placed in this encounter.    Disposition:   FU with Dr. Stanford Breed  Signed, Rosaria Ferries, PA-C  12/28/2017 4:00 PM    [place] Phone: [phone]; Fax: [phone]  This note was written with the assistance of speech recognition software. Please excuse any transcriptional errors.

## 2018-01-03 NOTE — Progress Notes (Signed)
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  History supplemented by endocrinologist and referring provider's notes.  He has history lumbar radiculopathy down both hips and legs (worse on the left) since injuring his back in the [employer] many years ago.  MRI of lumbar spine from 06/16/13 personally reviewed and showed disc bulge and facet arthropathy with moderate biforaminal narrowing at L5-S1.  In the past he would get epidural injections which helped for about 6 months.  He has diabetic neuropathy and has numbness and pain in the feet. He also has gout in the big toes.  He has problems with balance.  He has some residual weakness in the right leg due to stroke.  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  He has associated numbness.    He takes gabapentin 200mg  twice daily and may take an additional 200mg  during the day if needed.    12/10/17 HGB A1c 10.8 12/03/17 CMP:  Na 131, K 4.1, glucose 514, BUN 20, Cr 1.70, t bili 0.7, ALP 119, AST 13, ALT 16.  Creatine Clearance 50  PAST MEDICAL HISTORY: Past Medical History:  Diagnosis Date  . Allergy   . Angina   . Arthritis   . Backache, unspecified   . CHF (congestive heart failure) ([place])   . Chills   . Chronic kidney disease, stage III (moderate) (HCC)    HD T- TH-SAT  . Complication of anesthesia    01/2011 could not eat,, hospt x2, was placed on hd and cleared up  . Coronary atherosclerosis of native coronary artery   . Diabetes mellitus 2004  . Dizziness   . Dysphagia,  unspecified(787.20)   . Gastroparesis   . Headache(784.0)   . Hemorrhage of rectum and anus   . Hyperlipidemia   . Hypertrophy of prostate with urinary obstruction and other lower urinary tract symptoms (LUTS)   . INTERNAL HEMORRHOIDS 10/16/2008   Qualifier: Diagnosis of  By: Nolon Rod CMA (AAMA), Robin    . Internal hemorrhoids without mention of complication   . Myocardial infarction ([place]) 2002  . Nausea alone   . Obesity   . Other dyspnea and respiratory abnormality   . Other malaise and fatigue   . Personal history of unspecified circulatory disease   . Posttraumatic stress disorder   . Sleep apnea    USES CPAP   . Stroke [facility])    2007  . Unspecified essential hypertension    hx htn     PAST SURGICAL HISTORY: Past Surgical History:  Procedure Laterality Date  . ANAL FISSURECTOMY    . AV FISTULA PLACEMENT    . CARDIAC CATHETERIZATION     2005 DR BRODIE  . HEMORRHOID SURGERY    . revision of fistula     renal failure  . VENTRAL HERNIA REPAIR  07/01/2011   Procedure: HERNIA REPAIR VENTRAL ADULT;  Surgeon: Joyice Faster. Cornett, MD;  Location: [facility];  Service: General;  Laterality: N/A;    MEDICATIONS: Current Outpatient Medications on File Prior to Visit  Medication Sig Dispense Refill  . acetaminophen (TYLENOL) 500 MG  tablet Take 1,000 mg by mouth as needed for mild pain or headache.     . allopurinol (ZYLOPRIM) 100 MG tablet Take 100 mg by mouth 2 (two) times daily.     [name] amLODipine (NORVASC) 10 MG tablet Take 10 mg by mouth daily.    [name] aspirin 81 MG tablet Take 81 mg by mouth at bedtime.     [name] atorvastatin (LIPITOR) 40 MG tablet Take 40 mg by mouth daily.    . clopidogrel (PLAVIX) 75 MG tablet Take 75 mg by mouth daily.     . Continuous Blood Gluc Receiver (FREESTYLE LIBRE 14 DAY READER) DEVI 1 Device by Does not apply route daily. (Patient not taking: Reported on 12/28/2017) 1 Device 0  . Continuous Blood Gluc Sensor (FREESTYLE LIBRE 14 DAY SENSOR) MISC 1 Device by Does  not apply route every 14 (fourteen) days. (Patient not taking: Reported on 12/28/2017) 3 each 11  . cyclobenzaprine (FLEXERIL) 5 MG tablet Take 0.5-1 tablets (2.5-5 mg total) 3 (three) times daily as needed by mouth for muscle spasms. (Patient not taking: Reported on 12/28/2017) 30 tablet 0  . diazepam (VALIUM) 5 MG tablet Take 1 tablet (5 mg total) by mouth at bedtime. 30 tablet 0  . furosemide (LASIX) 40 MG tablet Take 1 tablet (40 mg total) by mouth daily. (Patient taking differently: Take 20 mg by mouth daily. ) 30 tablet   . gabapentin (NEURONTIN) 100 MG capsule 2 in the am, 2 in the pm, and 2 if needed at night (Patient taking differently: Take 200 mg by mouth See admin instructions. Take 200 mg by mouth in the morning and 200 mg at bedtime SCHEDULED and may take an additional 200 mg during the day AS NEEDED for nerve pain) 180 capsule 3  . glucose blood (ACCU-CHEK AVIVA PLUS) test strip USE AS DIRECTED CHECK BLOOD SUGAR TWICE A DAY 200 each 3  . insulin lispro (HUMALOG) 100 UNIT/ML injection Inject into the skin 3 (three) times daily before meals.    . insulin NPH Human (HUMULIN N,NOVOLIN N) 100 UNIT/ML injection Inject 1 mL (100 Units total) into the skin 2 (two) times daily. And syringes 2/day 200 mL 11  . Insulin Syringe-Needle U-100 (B-D INS SYR ULTRAFINE 1CC/31G) 31G X 5/16" 1 ML MISC Used to give insulin injections twice daily. 100 each 11  . Magnesium 200 MG TABS Take 200 mg by mouth daily with lunch.     . mycophenolate (MYFORTIC) 180 MG EC tablet Take 180 mg by mouth 2 (two) times daily.    . nitroGLYCERIN (NITROSTAT) 0.4 MG SL tablet Place 1 tablet (0.4 mg total) under the tongue every 5 (five) minutes as needed for chest pain. 25 tablet 0  . omeprazole (PRILOSEC) 20 MG capsule Take 1 capsule (20 mg total) by mouth 2 (two) times daily before a meal. 180 capsule 3  . tacrolimus (PROGRAF) 1 MG capsule     . Tacrolimus 1 MG CP24 Take 4 mg by mouth 2 (two) times daily.     . traMADol (ULTRAM)  50 MG tablet Take 1 tablet (50 mg total) by mouth every 8 (eight) hours as needed for moderate pain or severe pain. (Patient taking differently: Take 50 mg by mouth See admin instructions. Take 50 mg by mouth at bedtime and 50 mg two times a day as needed for pain) 60 tablet 1   No current facility-administered medications on file prior to visit.     ALLERGIES: No Known Allergies  FAMILY HISTORY: Family History  Problem Relation Age of Onset  . Heart disease Father   . Renal Disease Father   . Dementia Mother   . Diabetes Sister   . Heart disease Sister   . Diabetes Maternal Aunt   . Diabetes Maternal Uncle   . Diabetes Paternal Aunt   . Diabetes Paternal Uncle   . Heart disease Unknown   . Heart disease Brother     SOCIAL HISTORY: Social History   Socioeconomic History  . Marital status: Married    Spouse name: Not on file  . Number of children: Not on file  . Years of education: Not on file  . Highest education level: Not on file  Occupational History  . Occupation: disabled    Employer: DISABLED  Social Needs  . Financial resource strain: Not on file  . Food insecurity:    Worry: Not on file    Inability: Not on file  . Transportation needs:    Medical: Not on file    Non-medical: Not on file  Tobacco Use  . Smoking status: Never Smoker  . Smokeless tobacco: Never Used  Substance and Sexual Activity  . Alcohol use: No  . Drug use: No  . Sexual activity: Not Currently  Lifestyle  . Physical activity:    Days per week: Not on file    Minutes per session: Not on file  . Stress: Not on file  Relationships  . Social connections:    Talks on phone: Not on file    Gets together: Not on file    Attends religious service: Not on file    Active member of club or organization: Not on file    Attends meetings of clubs or organizations: Not on file    Relationship status: Not on file  . Intimate partner violence:    Fear of current or ex partner: Not on file     Emotionally abused: Not on file    Physically abused: Not on file    Forced sexual activity: Not on file  Other Topics Concern  . Not on file  Social History Narrative   Married 4022. 23 year old son in 67. 1 granddaughter from [place].       Retired from [employer]. 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)      Hobbies:enjoys doing things for others, mission working with homeless    REVIEW OF SYSTEMS: Constitutional: No fevers, chills, or sweats, no generalized fatigue, change in appetite Eyes: No visual changes, double vision, eye pain Ear, nose and throat: No hearing loss, ear pain, nasal congestion, sore throat Cardiovascular: No chest pain, palpitations Respiratory:  No shortness of breath at rest or with exertion, wheezes GastrointestinaI: No nausea, vomiting, diarrhea, abdominal pain, fecal incontinence Genitourinary:  No dysuria, urinary retention or frequency Musculoskeletal:  No neck pain, back pain Integumentary: No rash, pruritus, skin lesions Neurological: as above Psychiatric: No depression, insomnia, anxiety Endocrine: No palpitations, fatigue, diaphoresis, mood swings, change in appetite, change in weight, increased thirst Hematologic/Lymphatic:  No purpura, petechiae. Allergic/Immunologic: no itchy/runny eyes, nasal congestion, recent allergic reactions, rashes  PHYSICAL EXAM: Blood pressure 110/68, pulse 90, height 5' 10.5" (1.791 m), weight 260 lb (117.9 kg), SpO2 94 %. General: No acute distress.  Patient appears well-groomed. Head:  Normocephalic/atraumatic Eyes:  fundi examined but not visualized Neck: supple, no paraspinal tenderness, full range of motion Back: No paraspinal tenderness Heart: regular rate and rhythm Lungs: Clear to auscultation  bilaterally. Vascular: No carotid bruits. Neurological Exam: Mental status: alert and oriented to person, place, and time, recent and remote memory intact, fund of knowledge intact,  attention and concentration intact, speech fluent and not dysarthric, language intact. Cranial nerves: CN I: not tested CN II: pupils equal, round and reactive to light, visual fields intact CN III, IV, VI:  full range of motion, no nystagmus, no ptosis CN V: facial sensation intact CN VII: upper and lower face symmetric CN VIII: hearing intact CN IX, X: gag intact, uvula midline CN XI: sternocleidomastoid and trapezius muscles intact CN XII: tongue midline Bulk & Tone: normal, no fasciculations. Motor:  5-/5 left upper extremity.  Otherwise, 5/5 throughout  Sensation:  Pinprick and vibration sensation reduced up to below the knees. Deep Tendon Reflexes:  absent throughout, toes equivocal Finger to nose testing:  Without dysmetria.  Heel to shin:  Without dysmetria.  Gait:  Wide-based, unsteady.  Romberg with sway.  IMPRESSION: Diabetic polyneuropathy Lumbar radiculopathy  PLAN: 1.  We will try increasing gabapentin to 300mg  twice daily and he may take an extra 300mg  if needed. 2.  Optimize glycemic control. 3.  If pain persists, consider referral to pain specialist, where he may benefit from epidural injections for radicular pain.  Thank you for allowing me to take part in the care of this patient.  Metta Clines, DO  CC:  Garret Reddish, MD

## 2018-01-04 ENCOUNTER — Encounter

## 2018-01-04 ENCOUNTER — Ambulatory Visit (INDEPENDENT_AMBULATORY_CARE_PROVIDER_SITE_OTHER): Payer: Medicare Other | Admitting: Neurology

## 2018-01-04 ENCOUNTER — Encounter: Payer: Self-pay | Admitting: Neurology

## 2018-01-04 VITALS — BP 110/68 | HR 90 | Ht 70.5 in | Wt 260.0 lb

## 2018-01-04 DIAGNOSIS — M5416 Radiculopathy, lumbar region: Secondary | ICD-10-CM | POA: Diagnosis not present

## 2018-01-04 DIAGNOSIS — E1142 Type 2 diabetes mellitus with diabetic polyneuropathy: Secondary | ICD-10-CM

## 2018-01-04 MED ORDER — GABAPENTIN 300 MG PO CAPS: ORAL_CAPSULE | ORAL | 3 refills | Status: DC

## 2018-01-04 NOTE — Patient Instructions (Signed)
1.  We will increase gabapentin to 300mg  twice daily and you may take an extra 300mg  during the day if needed. 2.  Follow up in 4 months.

## 2018-01-13 ENCOUNTER — Telehealth: Payer: Self-pay | Admitting: Endocrinology

## 2018-01-13 NOTE — Telephone Encounter (Signed)
[facility] is calling to check if we have received a fax from yesterday requesting CMN and the most recent chart notes.   Please advise    [place]

## 2018-01-19 NOTE — Telephone Encounter (Signed)
I have called & spoke with [name] she is sending over CMN once again because we still have not received paperwork. I did get fax & am giving paperwork to Dr. Loanne Drilling to sign.

## 2018-01-25 ENCOUNTER — Other Ambulatory Visit: Payer: Self-pay | Admitting: Cardiovascular Disease

## 2018-01-25 DIAGNOSIS — Z940 Kidney transplant status: Secondary | ICD-10-CM | POA: Diagnosis not present

## 2018-01-25 DIAGNOSIS — E785 Hyperlipidemia, unspecified: Secondary | ICD-10-CM | POA: Diagnosis not present

## 2018-01-25 DIAGNOSIS — I129 Hypertensive chronic kidney disease with stage 1 through stage 4 chronic kidney disease, or unspecified chronic kidney disease: Secondary | ICD-10-CM | POA: Diagnosis not present

## 2018-01-25 DIAGNOSIS — E1129 Type 2 diabetes mellitus with other diabetic kidney complication: Secondary | ICD-10-CM | POA: Diagnosis not present

## 2018-01-25 DIAGNOSIS — N2581 Secondary hyperparathyroidism of renal origin: Secondary | ICD-10-CM | POA: Diagnosis not present

## 2018-01-25 LAB — VITAMIN D 25 HYDROXY (VIT D DEFICIENCY, FRACTURES): VIT D 25 HYDROXY: 20.4

## 2018-01-25 LAB — HEPATIC FUNCTION PANEL
ALT: 13 (ref 10–40)
AST: 12 — AB (ref 14–40)
Alkaline Phosphatase: 120 (ref 25–125)
Bilirubin, Total: 0.4

## 2018-01-25 LAB — CBC AND DIFFERENTIAL
HCT: 41 (ref 41–53)
Hemoglobin: 13.4 — AB (ref 13.5–17.5)
Platelets: 232 (ref 150–399)

## 2018-01-25 LAB — BASIC METABOLIC PANEL
BUN: 18 (ref 4–21)
CREATININE: 1.7 — AB (ref 0.6–1.3)
GLUCOSE: 163
POTASSIUM: 3.9 (ref 3.4–5.3)
SODIUM: 136 — AB (ref 137–147)

## 2018-01-26 ENCOUNTER — Telehealth: Payer: Self-pay

## 2018-01-26 NOTE — Telephone Encounter (Signed)
Called pt to request CBG's per Dr. Loanne Drilling request to fill out paperwork from [facility] and pt stated that he no longer wants to be a pt here because he has faxed and dropped off a copy of what's being requested and he feels like Dr. Loanne Drilling does not have the time to provide adequate care for him. I apologized to pt for any confusion.

## 2018-01-31 ENCOUNTER — Encounter: Payer: Self-pay | Admitting: Family Medicine

## 2018-02-03 ENCOUNTER — Telehealth: Payer: Self-pay | Admitting: Emergency Medicine

## 2018-02-03 NOTE — Telephone Encounter (Signed)
Pts wife called and stated patient is still waiting for a form to be filled out so he can get the [employer]. She wants to know if you have received the paperwork and if so has it been filled out yet and faxed back. Please give her a call back and let her know thanks.

## 2018-02-04 NOTE — Telephone Encounter (Signed)
Patient's wife(Marcella Bazile) calling to check status of previous message sent.  She states she has been trying to get information regarding paperwork for [facility] for a month now and she would like to know what the delay is and why no one has called her back.  Patient requesting a call back at [phone].

## 2018-02-07 NOTE — Telephone Encounter (Signed)
Spoke to pt wife and apologized on behalf of the office for any delays and placed paperwork on Dr. Cordelia Pen desk for [employer]

## 2018-02-08 DIAGNOSIS — Z940 Kidney transplant status: Secondary | ICD-10-CM | POA: Diagnosis not present

## 2018-02-10 ENCOUNTER — Telehealth: Payer: Self-pay | Admitting: Endocrinology

## 2018-02-10 ENCOUNTER — Ambulatory Visit: Payer: Medicare Other | Admitting: Endocrinology

## 2018-02-10 DIAGNOSIS — Z0289 Encounter for other administrative examinations: Secondary | ICD-10-CM

## 2018-02-10 NOTE — Telephone Encounter (Signed)
Patients wife would like to follow up on the paperwork that was waiting on Dr. Cordelia Pen signature.   Please advise

## 2018-02-11 ENCOUNTER — Encounter: Payer: Self-pay | Admitting: Family Medicine

## 2018-02-11 ENCOUNTER — Telehealth: Payer: Self-pay | Admitting: *Deleted

## 2018-02-11 ENCOUNTER — Telehealth: Payer: Self-pay

## 2018-02-11 NOTE — Telephone Encounter (Signed)
Spoke to pt and informed him that paperwork was pulled off fax today and placed on physician desk and will be faxed by the end of the day today

## 2018-02-11 NOTE — Telephone Encounter (Signed)
Copied from [place] [id number]. Topic: General - Other >> Feb 11, 2018  3:17 PM Reyne Dumas L wrote: Reason for CRM:   Pt called and states that he needs to speak with Dr. Yong Channel about an issue that he is having with Dr. Loanne Drilling.  Pt states he can be reached at [phone]

## 2018-02-11 NOTE — Telephone Encounter (Addendum)
Called pt wife to inform her that Dr. Loanne Drilling stated that he will not fill out paperwork due to pt stating that he no longer wanted to be a pt here. Wife became very upset and stated that she will contact office manager on Monday and follow our chain of command until this is resolved. Also, which [facility] do you want [place] to send records to?

## 2018-02-13 NOTE — Telephone Encounter (Signed)
please call patient's wife. Our records say that he no longer wishes to be a patient here.  Please clarify your wishes.

## 2018-02-15 ENCOUNTER — Ambulatory Visit (INDEPENDENT_AMBULATORY_CARE_PROVIDER_SITE_OTHER): Payer: Medicare Other | Admitting: Podiatry

## 2018-02-15 ENCOUNTER — Encounter: Payer: Self-pay | Admitting: Podiatry

## 2018-02-15 DIAGNOSIS — B351 Tinea unguium: Secondary | ICD-10-CM

## 2018-02-15 DIAGNOSIS — E1159 Type 2 diabetes mellitus with other circulatory complications: Secondary | ICD-10-CM | POA: Diagnosis not present

## 2018-02-15 DIAGNOSIS — M79676 Pain in unspecified toe(s): Secondary | ICD-10-CM | POA: Diagnosis not present

## 2018-02-15 DIAGNOSIS — D689 Coagulation defect, unspecified: Secondary | ICD-10-CM | POA: Diagnosis not present

## 2018-02-15 NOTE — Progress Notes (Signed)
Complaint:  Visit Type: Patient returns to my office for continued preventative foot care services. Complaint: Patient states" my nails have grown long and thick and become painful to walk and wear shoes" Patient has been diagnosed with DM with vascular disease.. The patient presents for preventative foot care services. No changes to ROS.  Patient is taking plavix.  Podiatric Exam: Vascular: dorsalis pedis and posterior tibial pulses are not  palpable bilateral. Capillary return is immediate. Temperature gradient is WNL. Skin turgor WNL  Sensorium: Normal Semmes Weinstein monofilament test. Normal tactile sensation bilaterally. Nail Exam: Pt has thick disfigured discolored nails with subungual debris noted bilateral entire nail hallux through fifth toenails.   Ulcer Exam: There is no evidence of ulcer or pre-ulcerative changes or infection. Orthopedic Exam: Muscle tone and strength are WNL. No limitations in general ROM. No crepitus or effusions noted. Foot type and digits show no abnormalities. Bony prominences are unremarkable. Skin: No Porokeratosis. No infection or ulcers.    Diagnosis:  Onychomycosis, , Pain in right toe, pain in left toes    Treatment & Plan Procedures and Treatment: Consent by patient was obtained for treatment procedures. The patient understood the discussion of treatment and procedures well. All questions were answered thoroughly reviewed. Debridement of mycotic and hypertrophic toenails, 1 through 5 bilateral and clearing of subungual debris. No ulceration, no infection noted. Return Visit-Office Procedure: Patient instructed to return to the office for a follow up visit 3 months for continued evaluation and treatment.    Gardiner Barefoot DPM

## 2018-02-15 NOTE — Telephone Encounter (Signed)
Please investigate the issue. Patient was advised to follow up in 2 months about 2 months ago and I do not see that he scheduled an appointment.

## 2018-02-15 NOTE — Telephone Encounter (Signed)
Dr. Hunter-  Please see message 

## 2018-02-15 NOTE — Telephone Encounter (Signed)
Spoke with patients wife clarification was needed so we knew what to do to assist the patient. She was informed by me that the forms are on Dr. Cordelia Pen desk and not being signed because he is unaware of whether the patient still wants to be a patient and if we attempted to get the Elenor Legato for the patient he will need training in this office. She was unaware of this and now understands that there needs to be a decision made by the patient as to what he is doing. I offered for him to see another MD if he would like in this office as well. awaiting call back from patient or wife with determination so we can move forward in the best way

## 2018-02-16 ENCOUNTER — Other Ambulatory Visit: Payer: Self-pay | Admitting: Family Medicine

## 2018-02-16 NOTE — Telephone Encounter (Signed)
Spoke to pt, asked him what issue he needs to discuss? Pt said he is having trouble with Dr. Loanne Drilling he said he took a form/Rx to him back in June for a new glucose meter and discussed it at his visit in July and Dr. Loanne Drilling said he would take care of it. Since pt has decided to change providers to Dr. Dwyane Dee due to he does not like Dr. Loanne Drilling because "he does not listen to him and just changes medicine". 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 Pt said his wife called the office and spoke to office Administrator and was told that Dr. Loanne Drilling is refusing to sign the Rx now. Pt is very upset and wanted to know what Dr. Yong Channel could do. Asked pt if he had an appt with Dr. Dwyane Dee? Pt said he did beginning of Oct but now it is gone in My Chart. Told pt I will talk to Dr. Yong Channel and get back to him. Pt verbalized understanding.   Discussed Situation with my office manager Lea and she has reached out to our [place] about your situation.  Discussed with Dr. Yong Channel, he said okay for pt to see Rx here for meter and he will sign. Also pt can make an appt with Dwyane Dee or the new provider at [facility] whichever he can get in with.

## 2018-02-16 NOTE — Telephone Encounter (Signed)
Pt states that he would like to move to Dr. Dwyane Dee as a provider, message sent to transition care...  Patient states he feels like Dr Loanne Drilling does not spend enough time with his patients.   Patient has spoken with Dr. Yong Channel at [facility] - I am sending that over now.

## 2018-02-16 NOTE — Telephone Encounter (Signed)
Called pt back told him Dr. Yong Channel said to have the form/Rx sent here and he will sign for it. Pt said he does not have them it was from [employer] and was sent from the pharmacy cause there is a Rx there already. Told pt okay please have the pharmacy fax the form to Dr. Yong Channel. Pt verbalized understanding and said he will call the pharmacy and have sent over. Told pt I will keep an eye out for the form. Also told pt I discussed your situation with my office manager Lea and she has reached out to our Assistant Director Rober Minion about the situation with the office. Pt verbalized understanding. Told pt needs to schedule follow up with Dr. Dwyane Dee or the new provider at the [facility] office. Pt verbalized understanding.

## 2018-02-17 DIAGNOSIS — Z940 Kidney transplant status: Secondary | ICD-10-CM | POA: Diagnosis not present

## 2018-02-22 ENCOUNTER — Ambulatory Visit: Payer: Medicare Other | Admitting: Endocrinology

## 2018-02-22 DIAGNOSIS — I129 Hypertensive chronic kidney disease with stage 1 through stage 4 chronic kidney disease, or unspecified chronic kidney disease: Secondary | ICD-10-CM | POA: Diagnosis not present

## 2018-02-22 DIAGNOSIS — E785 Hyperlipidemia, unspecified: Secondary | ICD-10-CM | POA: Diagnosis not present

## 2018-02-22 DIAGNOSIS — Z940 Kidney transplant status: Secondary | ICD-10-CM | POA: Diagnosis not present

## 2018-02-22 DIAGNOSIS — Z0289 Encounter for other administrative examinations: Secondary | ICD-10-CM

## 2018-02-22 DIAGNOSIS — E1129 Type 2 diabetes mellitus with other diabetic kidney complication: Secondary | ICD-10-CM | POA: Diagnosis not present

## 2018-02-22 DIAGNOSIS — N2581 Secondary hyperparathyroidism of renal origin: Secondary | ICD-10-CM | POA: Diagnosis not present

## 2018-02-22 LAB — BASIC METABOLIC PANEL
BUN: 21 (ref 4–21)
Creatinine: 1.6 — AB (ref 0.6–1.3)
GLUCOSE: 195
Potassium: 3.7 (ref 3.4–5.3)
Sodium: 141 (ref 137–147)

## 2018-02-22 LAB — CBC AND DIFFERENTIAL
HCT: 41 (ref 41–53)
HEMOGLOBIN: 13.6 (ref 13.5–17.5)
PLATELETS: 251 (ref 150–399)
WBC: 4.1

## 2018-02-22 LAB — HEPATIC FUNCTION PANEL
AST: 17 (ref 14–40)
Alkaline Phosphatase: 116 (ref 25–125)
BILIRUBIN, TOTAL: 0.5

## 2018-02-22 NOTE — Progress Notes (Deleted)
Patient ID: [name], male   DOB: 1950/09/15, 67 y.o.   MRN: [id number]          Reason for Appointment: Consultation for Type 2 Diabetes  Referring physician:   History of Present Illness:          Date of diagnosis of type 2 diabetes mellitus:        Background history:    Recent history:   Most recent A1c is done on  INSULIN regimen is:       Non-insulin hypoglycemic drugs the patient is taking are:  Current management, blood sugar patterns and problems identified:            Side effects from medications have been:      Meal times are:  Breakfast is at Lunch: Dinner:    Typical meal intake: Breakfast is                Exercise:    Glucose monitoring:  done  times a day         Glucometer: One Touch .       Blood Glucose readings by time of day and averages from meter download:  PREMEAL Breakfast Lunch Dinner Bedtime  Overall   Glucose range:       Median:        POST-MEAL PC Breakfast PC Lunch PC Dinner  Glucose range:     Median:       Dietician visit, most recent:  Weight history:  Wt Readings from Last 3 Encounters:  01/04/18 260 lb (117.9 kg)  12/28/17 260 lb 12.8 oz (118.3 kg)  12/21/17 251 lb (113.9 kg)    Glycemic control:   Lab Results  Component Value Date   HGBA1C 10.8 (A) 12/10/2017   HGBA1C 9.2 09/27/2017   HGBA1C 9.5 07/28/2017   Lab Results  Component Value Date   MICROALBUR 32.5 (H) 02/27/2009   LDLCALC 85 02/16/2017   CREATININE 1.7 (A) 01/25/2018   Lab Results  Component Value Date   MICRALBCREAT 486.5 (H) 02/27/2009    No results found for: FRUCTOSAMINE  No visits with results within 1 Week(s) from this visit.  Latest known visit with results is:  Abstract on 01/31/2018  Component Date Value Ref Range Status  . Hemoglobin 01/25/2018 13.4* 13.5 - 17.5 Final  . HCT 01/25/2018 41  41 - 53 Final  . Platelets 01/25/2018 232  150 - 399 Final  . Vit D, 25-Hydroxy 01/25/2018 20.4   Final  . Glucose 01/25/2018  163   Final  . BUN 01/25/2018 18  4 - 21 Final  . Creatinine 01/25/2018 1.7* 0.6 - 1.3 Final  . Potassium 01/25/2018 3.9  3.4 - 5.3 Final  . Sodium 01/25/2018 136* 137 - 147 Final  . Alkaline Phosphatase 01/25/2018 120  25 - 125 Final  . ALT 01/25/2018 13  10 - 40 Final  . AST 01/25/2018 12* 14 - 40 Final  . Bilirubin, Total 01/25/2018 0.4   Final    Allergies as of 02/22/2018   No Known Allergies     Medication List        Accurate as of 02/22/18  8:45 AM. Always use your most recent med list.          acetaminophen 500 MG tablet Commonly known as:  TYLENOL Take 1,000 mg by mouth as needed for mild pain or headache.   allopurinol 100 MG tablet Commonly known as:  ZYLOPRIM Take 100 mg by mouth  2 (two) times daily.   amLODipine 10 MG tablet Commonly known as:  NORVASC Take 10 mg by mouth daily.   aspirin 81 MG tablet Take 81 mg by mouth at bedtime.   atorvastatin 40 MG tablet Commonly known as:  LIPITOR Take 40 mg by mouth daily.   clopidogrel 75 MG tablet Commonly known as:  PLAVIX Take 75 mg by mouth daily.   cyclobenzaprine 5 MG tablet Commonly known as:  FLEXERIL Take 0.5-1 tablets (2.5-5 mg total) 3 (three) times daily as needed by mouth for muscle spasms.   diazepam 5 MG tablet Commonly known as:  VALIUM Take 1 tablet (5 mg total) by mouth at bedtime.   FREESTYLE LIBRE 14 DAY READER Devi 1 Device by Does not apply route daily.   FREESTYLE LIBRE 14 DAY SENSOR Misc APPLY EVERY 14 DAYS   furosemide 40 MG tablet Commonly known as:  LASIX Take 1 tablet (40 mg total) by mouth daily.   gabapentin 300 MG capsule Commonly known as:  NEURONTIN Take 1 capsule in AM, 1 capsule at night.  May take another 1 capsule as needed   glucose blood test strip USE AS DIRECTED CHECK BLOOD SUGAR TWICE A DAY   HUMALOG 100 UNIT/ML injection Generic drug:  insulin lispro Inject into the skin 3 (three) times daily before meals.   insulin NPH Human 100 UNIT/ML  injection Commonly known as:  HUMULIN N,NOVOLIN N Inject 1 mL (100 Units total) into the skin 2 (two) times daily. And syringes 2/day   Insulin Syringe-Needle U-100 31G X 5/16" 1 ML Misc Used to give insulin injections twice daily.   Magnesium 200 MG Tabs Take 200 mg by mouth daily with lunch.   mycophenolate 180 MG EC tablet Commonly known as:  MYFORTIC Take 180 mg by mouth 2 (two) times daily.   nitroGLYCERIN 0.4 MG SL tablet Commonly known as:  NITROSTAT PLACE 1 TABLET (0.4 MG TOTAL) UNDER THE TONGUE EVERY 5 (FIVE) MINUTES AS NEEDED FOR CHEST PAIN.   omeprazole 20 MG capsule Commonly known as:  PRILOSEC Take 1 capsule (20 mg total) by mouth 2 (two) times daily before a meal.   Tacrolimus 1 MG Cp24 Take 4 mg by mouth 2 (two) times daily.   tacrolimus 1 MG capsule Commonly known as:  PROGRAF   traMADol 50 MG tablet Commonly known as:  ULTRAM Take 1 tablet (50 mg total) by mouth every 8 (eight) hours as needed for moderate pain or severe pain.       Allergies: No Known Allergies  Past Medical History:  Diagnosis Date  . Allergy   . Angina   . Arthritis   . Backache, unspecified   . CHF (congestive heart failure) ([place])   . Chills   . Chronic kidney disease, stage III (moderate) (HCC)    HD T- TH-SAT  . Complication of anesthesia    01/2011 could not eat,, hospt x2, was placed on hd and cleared up  . Coronary atherosclerosis of native coronary artery   . Diabetes mellitus 2004  . Dizziness   . Dysphagia, unspecified(787.20)   . Gastroparesis   . Headache(784.0)   . Hemorrhage of rectum and anus   . Hyperlipidemia   . Hypertrophy of prostate with urinary obstruction and other lower urinary tract symptoms (LUTS)   . INTERNAL HEMORRHOIDS 10/16/2008   Qualifier: Diagnosis of  By: Nolon Rod CMA (AAMA), Robin    . Internal hemorrhoids without mention of complication   . Myocardial infarction ([place])  2002  . Nausea alone   . Obesity   . Other dyspnea and respiratory  abnormality   . Other malaise and fatigue   . Personal history of unspecified circulatory disease   . Posttraumatic stress disorder   . Sleep apnea    USES CPAP   . Stroke [facility])    2007  . Unspecified essential hypertension    hx htn     Past Surgical History:  Procedure Laterality Date  . ANAL FISSURECTOMY    . AV FISTULA PLACEMENT    . CARDIAC CATHETERIZATION     2005 DR BRODIE  . HEMORRHOID SURGERY    . revision of fistula     renal failure  . VENTRAL HERNIA REPAIR  07/01/2011   Procedure: HERNIA REPAIR VENTRAL ADULT;  Surgeon: Joyice Faster. Cornett, MD;  Location: [facility];  Service: General;  Laterality: N/A;    Family History  Problem Relation Age of Onset  . Heart disease Father   . Renal Disease Father   . Dementia Mother   . Heart attack Mother   . Diabetes Sister   . Heart disease Sister   . Diabetes Maternal Aunt   . Diabetes Maternal Uncle   . Diabetes Paternal Aunt   . Diabetes Paternal Uncle   . Heart disease Unknown   . Heart attack Brother   . Lung cancer Sister   . Renal Disease Brother     Social History:  reports that he has never smoked. He has never used smokeless tobacco. He reports that he does not drink alcohol or use drugs.   Review of Systems   Lipid history:     Lab Results  Component Value Date   CHOL 198 02/16/2017   HDL 46 02/16/2017   LDLCALC 85 02/16/2017   LDLDIRECT 159.2 07/29/2011   TRIG 333 (A) 02/16/2017   CHOLHDL 5 07/29/2011           Hypertension: Has been present  BP Readings from Last 3 Encounters:  01/04/18 110/68  12/28/17 132/72  12/21/17 (!) 143/77    Most recent eye exam was in  Most recent foot exam:  Currently known complications of diabetes:  LABS:  No visits with results within 1 Week(s) from this visit.  Latest known visit with results is:  Abstract on 01/31/2018  Component Date Value Ref Range Status  . Hemoglobin 01/25/2018 13.4* 13.5 - 17.5 Final  . HCT 01/25/2018 41  41 - 53 Final  .  Platelets 01/25/2018 232  150 - 399 Final  . Vit D, 25-Hydroxy 01/25/2018 20.4   Final  . Glucose 01/25/2018 163   Final  . BUN 01/25/2018 18  4 - 21 Final  . Creatinine 01/25/2018 1.7* 0.6 - 1.3 Final  . Potassium 01/25/2018 3.9  3.4 - 5.3 Final  . Sodium 01/25/2018 136* 137 - 147 Final  . Alkaline Phosphatase 01/25/2018 120  25 - 125 Final  . ALT 01/25/2018 13  10 - 40 Final  . AST 01/25/2018 12* 14 - 40 Final  . Bilirubin, Total 01/25/2018 0.4   Final    Physical Examination:  There were no vitals taken for this visit.  GENERAL:         Patient has generalized obesity.    HEENT:         Eye exam shows normal external appearance.  Fundus exam shows no retinopathy.  Oral exam shows normal mucosa .   NECK:   There is no lymphadenopathy  Thyroid  is not enlarged and no nodules felt.   Carotids are normal to palpation and no bruit heard  LUNGS:         Chest is symmetrical. Lungs are clear to auscultation.[name]   HEART:         Heart sounds:  S1 and S2 are normal. No murmur or click heard., no S3 or S4.   ABDOMEN:   There is no distention present. Liver and spleen are not palpable.  No other mass or tenderness present.    NEUROLOGICAL:   Ankle jerks are absent bilaterally.    Diabetic Foot Exam - Simple   No data filed             Vibration sense is  reduced in distal first toes.  MUSCULOSKELETAL:  There is no swelling or deformity of the peripheral joints.     EXTREMITIES:     There is no ankle edema.  SKIN:       No rash or lesions of concern.        ASSESSMENT:  Diabetes type 2  See history of present illness for detailed discussion of current diabetes management, blood sugar patterns and problems identified  Recent A1c indicates  Current treatment regimen is  Complications of diabetes:    PLAN:    1. Glucose monitoring: . Patient advised to check readings either fasting or 2 hours after meals  2.  Diabetes education: . Patient will need  3.  Lifestyle  changes: . Dietary changes: . Exercise regimen:  4.  Medication changes needed: .   5.  Preventive care needed:  .   6.  Follow-up:    There are no Patient Instructions on file for this visit.   Consultation note has been sent to the referring physician  Elayne Snare 02/22/2018, 8:45 AM   Note: This office note was prepared with Dragon voice recognition system technology. Any transcriptional errors that result from this process are unintentional.

## 2018-02-25 ENCOUNTER — Other Ambulatory Visit: Payer: Self-pay

## 2018-02-25 MED ORDER — FREESTYLE LIBRE 14 DAY SENSOR MISC: 1.0000 [IU] | 11 refills | Status: DC

## 2018-03-01 ENCOUNTER — Encounter: Payer: Self-pay | Admitting: Family Medicine

## 2018-03-08 ENCOUNTER — Telehealth: Payer: Self-pay | Admitting: Family Medicine

## 2018-03-08 DIAGNOSIS — Z940 Kidney transplant status: Secondary | ICD-10-CM | POA: Diagnosis not present

## 2018-03-08 NOTE — Telephone Encounter (Unsigned)
Copied from [place] [id number]. Topic: General - Other >> Mar 08, 2018  4:20 PM Carolyn Stare wrote:  Pt is asking for a order to get  CGM Abbott free style Montez Morita with [employer]  call and will fax all the paperwork over that she will need to process this order    [phone]

## 2018-03-08 NOTE — Telephone Encounter (Signed)
See note

## 2018-03-17 NOTE — Telephone Encounter (Signed)
Returned Debbie's call but she is out of the office until 03/22/18. Her coworker Cyril Mourning is covering for her and her telephone number is [phone].   Per the paperwork that was sent from [place] the patient will not qualify for the systemas he is not on insulin and does not check his blood sugar 4 times a day, nor is he on an insulin pump.

## 2018-03-22 NOTE — Telephone Encounter (Signed)
Called and left a detailed voicemail message letting [name] know that according to the guidelines listed on the first age of the order request they sent that patient will not qualify. He is not on insulin, an insulin pump, or checking his blood sugar multiple times a day. I provided a call back number for questions.

## 2018-03-22 NOTE — Progress Notes (Deleted)
HPI: Follow-up coronary artery disease.  Patient has had previous drug-eluting stent to his obtuse marginal.  Also with history of thoracic aortic aneurysm; CTA January 2017 showed 4.6 cm thoracic aortic aneurysm.  Admitted with chest pain June 2019.  Ruled out.  Echocardiogram July 2019 showed normal LV function, moderate diastolic dysfunction and trace aortic insufficiency; ascending aorta 4.6 cm.  Lower extremity arterial Doppler July 2019 showed 50 to 74% stenosis in proximal and proximal/mid SFA.  Distal left ATA severely diminished.  Seen by vascular surgery and medical therapy recommended.  Nuclear study July 2019 showed ejection fraction 54% and inferolateral ischemia.  Medical therapy was pursued due to risk of contrast with prior renal transplant.  Current Outpatient Medications  Medication Sig Dispense Refill  . acetaminophen (TYLENOL) 500 MG tablet Take 1,000 mg by mouth as needed for mild pain or headache.     . allopurinol (ZYLOPRIM) 100 MG tablet Take 100 mg by mouth 2 (two) times daily.     [name] amLODipine (NORVASC) 10 MG tablet Take 10 mg by mouth daily.    [name] aspirin 81 MG tablet Take 81 mg by mouth at bedtime.     [name] atorvastatin (LIPITOR) 40 MG tablet Take 40 mg by mouth daily.    . clopidogrel (PLAVIX) 75 MG tablet Take 75 mg by mouth daily.     . Continuous Blood Gluc Receiver (FREESTYLE LIBRE 14 DAY READER) DEVI 1 Device by Does not apply route daily. (Patient not taking: Reported on 12/28/2017) 1 Device 0  . Continuous Blood Gluc Sensor (FREESTYLE LIBRE 14 DAY SENSOR) MISC Place 1 Units onto the skin every 14 (fourteen) days. 3 each 11  . cyclobenzaprine (FLEXERIL) 5 MG tablet Take 0.5-1 tablets (2.5-5 mg total) 3 (three) times daily as needed by mouth for muscle spasms. (Patient not taking: Reported on 12/28/2017) 30 tablet 0  . diazepam (VALIUM) 5 MG tablet Take 1 tablet (5 mg total) by mouth at bedtime. 30 tablet 0  . furosemide (LASIX) 40 MG tablet Take 1 tablet (40 mg  total) by mouth daily. (Patient taking differently: Take 20 mg by mouth daily. ) 30 tablet   . gabapentin (NEURONTIN) 300 MG capsule Take 1 capsule in AM, 1 capsule at night.  May take another 1 capsule as needed 90 capsule 3  . glucose blood (ACCU-CHEK AVIVA PLUS) test strip USE AS DIRECTED CHECK BLOOD SUGAR TWICE A DAY 200 each 3  . insulin lispro (HUMALOG) 100 UNIT/ML injection Inject into the skin 3 (three) times daily before meals.    . insulin NPH Human (HUMULIN N,NOVOLIN N) 100 UNIT/ML injection Inject 1 mL (100 Units total) into the skin 2 (two) times daily. And syringes 2/day 200 mL 11  . Insulin Syringe-Needle U-100 (B-D INS SYR ULTRAFINE 1CC/31G) 31G X 5/16" 1 ML MISC Used to give insulin injections twice daily. 100 each 11  . Magnesium 200 MG TABS Take 200 mg by mouth daily with lunch.     . mycophenolate (MYFORTIC) 180 MG EC tablet Take 180 mg by mouth 2 (two) times daily.    . nitroGLYCERIN (NITROSTAT) 0.4 MG SL tablet PLACE 1 TABLET (0.4 MG TOTAL) UNDER THE TONGUE EVERY 5 (FIVE) MINUTES AS NEEDED FOR CHEST PAIN. 25 tablet 2  . omeprazole (PRILOSEC) 20 MG capsule Take 1 capsule (20 mg total) by mouth 2 (two) times daily before a meal. 180 capsule 3  . tacrolimus (PROGRAF) 1 MG capsule     . Tacrolimus  1 MG CP24 Take 4 mg by mouth 2 (two) times daily.     . traMADol (ULTRAM) 50 MG tablet Take 1 tablet (50 mg total) by mouth every 8 (eight) hours as needed for moderate pain or severe pain. (Patient taking differently: Take 50 mg by mouth See admin instructions. Take 50 mg by mouth at bedtime and 50 mg two times a day as needed for pain) 60 tablet 1   No current facility-administered medications for this visit.      Past Medical History:  Diagnosis Date  . Allergy   . Angina   . Arthritis   . Backache, unspecified   . CHF (congestive heart failure) ([place])   . Chills   . Chronic kidney disease, stage III (moderate) (HCC)    HD T- TH-SAT  . Complication of anesthesia    01/2011  could not eat,, hospt x2, was placed on hd and cleared up  . Coronary atherosclerosis of native coronary artery   . Diabetes mellitus 2004  . Dizziness   . Dysphagia, unspecified(787.20)   . Gastroparesis   . Headache(784.0)   . Hemorrhage of rectum and anus   . Hyperlipidemia   . Hypertrophy of prostate with urinary obstruction and other lower urinary tract symptoms (LUTS)   . INTERNAL HEMORRHOIDS 10/16/2008   Qualifier: Diagnosis of  By: Nolon Rod CMA (AAMA), Robin    . Internal hemorrhoids without mention of complication   . Myocardial infarction ([place]) 2002  . Nausea alone   . Obesity   . Other dyspnea and respiratory abnormality   . Other malaise and fatigue   . Personal history of unspecified circulatory disease   . Posttraumatic stress disorder   . Sleep apnea    USES CPAP   . Stroke [facility])    2007  . Unspecified essential hypertension    hx htn     Past Surgical History:  Procedure Laterality Date  . ANAL FISSURECTOMY    . AV FISTULA PLACEMENT    . CARDIAC CATHETERIZATION     2005 DR BRODIE  . HEMORRHOID SURGERY    . revision of fistula     renal failure  . VENTRAL HERNIA REPAIR  07/01/2011   Procedure: HERNIA REPAIR VENTRAL ADULT;  Surgeon: Joyice Faster. Cornett, MD;  Location: [facility];  Service: General;  Laterality: N/A;    Social History   Socioeconomic History  . Marital status: Married    Spouse name: Elisha Headland  . Number of children: 3  . Years of education: Not on file  . Highest education level: Associate degree: academic program  Occupational History  . Occupation: disabled    Employer: DISABLED  Social Needs  . Financial resource strain: Not on file  . Food insecurity:    Worry: Not on file    Inability: Not on file  . Transportation needs:    Medical: Not on file    Non-medical: Not on file  Tobacco Use  . Smoking status: Never Smoker  . Smokeless tobacco: Never Used  Substance and Sexual Activity  . Alcohol use: No  . Drug use: No  . Sexual  activity: Not Currently  Lifestyle  . Physical activity:    Days per week: Not on file    Minutes per session: Not on file  . Stress: Not on file  Relationships  . Social connections:    Talks on phone: Not on file    Gets together: Not on file    Attends religious service: Not  on file    Active member of club or organization: Not on file    Attends meetings of clubs or organizations: Not on file    Relationship status: Not on file  . Intimate partner violence:    Fear of current or ex partner: Not on file    Emotionally abused: Not on file    Physically abused: Not on file    Forced sexual activity: Not on file  Other Topics Concern  . Not on file  Social History Narrative   Married 6774. 52 year old son in 34. 1 granddaughter from [place].       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 He lives with his wife. He drinks 3-4 cups of coffee a day. He walks most every day.    Family History  Problem Relation Age of Onset  . Heart disease Father   . Renal Disease Father   . Dementia Mother   . Heart attack Mother   . Diabetes Sister   . Heart disease Sister   . Diabetes Maternal Aunt   . Diabetes Maternal Uncle   . Diabetes Paternal Aunt   . Diabetes Paternal Uncle   . Heart disease Unknown   . Heart attack Brother   . Lung cancer Sister   . Renal Disease Brother     ROS: no fevers or chills, productive cough, hemoptysis, dysphasia, odynophagia, melena, hematochezia, dysuria, hematuria, rash, seizure activity, orthopnea, PND, pedal edema, claudication. Remaining systems are negative.  Physical Exam: Well-developed well-nourished in no acute distress.  Skin is warm and dry.  HEENT is normal.  Neck is supple.  Chest is clear to auscultation with normal expansion.  Cardiovascular exam is regular rate and rhythm.   Abdominal exam nontender or distended. No masses palpated. Extremities show no edema. neuro grossly intact  ECG- personally reviewed  A/P  1  Kirk Ruths, MD

## 2018-03-31 ENCOUNTER — Telehealth: Payer: Self-pay | Admitting: Family Medicine

## 2018-03-31 NOTE — Telephone Encounter (Signed)
Copied from [place] [id number]. Topic: General - Other >> Mar 31, 2018  2:15 PM Janace Aris A wrote: Reason for CRM: pt called in wanting to follow up on a call regarding his medication for his insulin. Pt says he does not know why he does not qualify, because he checks his sugar 4-6 times a day. He would like a call back regarding this to go over some questions he has.

## 2018-04-04 ENCOUNTER — Ambulatory Visit: Payer: Medicare Other | Admitting: Cardiology

## 2018-04-06 NOTE — Progress Notes (Signed)
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  UPDATE:  He currently takes gabapentin 300 mg three times daily.  He also takes tramadol as needed for breakthrough pain.    Pain persists.  He has trouble with balance, particularly right after standing up.  There is no dizziness.    Last A1c was around 8.   HISTORY: He has history lumbar radiculopathy down both hips and legs (worse on the left) since injuring his back in the [employer] many years ago.  MRI of lumbar spine from 06/16/13 personally reviewed and showed disc bulge and facet arthropathy with moderate biforaminal narrowing at L5-S1.  In the past he would get epidural injections which helped for about 6 months.  He has diabetic neuropathy and has numbness and pain in the feet. He also has gout in the big toes.  He has problems with balance.  He has some residual weakness in the right leg due to stroke.  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  He has associated numbness.    PAST MEDICAL HISTORY: Past Medical History:  Diagnosis Date  . Allergy   . Angina   . Arthritis   . Backache, unspecified   . CHF (congestive heart failure) ([place])   . Chills   . Chronic kidney disease, stage III (moderate) (HCC)    HD T- TH-SAT  . Complication of anesthesia    01/2011 could not eat,, hospt x2, was placed on hd and cleared up  . Coronary atherosclerosis of native coronary artery   . Diabetes mellitus 2004  . Dizziness   . Dysphagia, unspecified(787.20)   . Gastroparesis   . Headache(784.0)   . Hemorrhage of rectum and anus   . Hyperlipidemia   . Hypertrophy of prostate with urinary obstruction and  other lower urinary tract symptoms (LUTS)   . INTERNAL HEMORRHOIDS 10/16/2008   Qualifier: Diagnosis of  By: Nolon Rod CMA (AAMA), Robin    . Internal hemorrhoids without mention of complication   . Myocardial infarction ([place]) 2002  . Nausea alone   . Obesity   . Other dyspnea and respiratory abnormality   . Other malaise and fatigue   . Personal history of unspecified circulatory disease   . Posttraumatic stress disorder   . Sleep apnea    USES CPAP   . Stroke [facility])    2007  . Unspecified essential hypertension    hx htn     MEDICATIONS: Current Outpatient Medications on File Prior to Visit  Medication Sig Dispense Refill  . acetaminophen (TYLENOL) 500 MG tablet Take 1,000 mg by mouth as needed for mild pain or headache.     . allopurinol (ZYLOPRIM) 100 MG tablet Take 100 mg by mouth 2 (two) times daily.     [name] amLODipine (NORVASC) 10 MG tablet Take 10 mg by mouth daily.    [name] aspirin 81 MG tablet Take 81 mg by mouth at bedtime.     [name] atorvastatin (LIPITOR) 40 MG tablet Take 40 mg by mouth daily.    . clopidogrel (PLAVIX) 75 MG tablet Take 75 mg by mouth daily.     . Continuous Blood Gluc Receiver (FREESTYLE LIBRE 14 DAY READER) DEVI 1  Device by Does not apply route daily. (Patient not taking: Reported on 12/28/2017) 1 Device 0  . Continuous Blood Gluc Sensor (FREESTYLE LIBRE 14 DAY SENSOR) MISC Place 1 Units onto the skin every 14 (fourteen) days. 3 each 11  . cyclobenzaprine (FLEXERIL) 5 MG tablet Take 0.5-1 tablets (2.5-5 mg total) 3 (three) times daily as needed by mouth for muscle spasms. (Patient not taking: Reported on 12/28/2017) 30 tablet 0  . diazepam (VALIUM) 5 MG tablet Take 1 tablet (5 mg total) by mouth at bedtime. 30 tablet 0  . furosemide (LASIX) 40 MG tablet Take 1 tablet (40 mg total) by mouth daily. (Patient taking differently: Take 20 mg by mouth daily. ) 30 tablet   . gabapentin (NEURONTIN) 300 MG capsule Take 1 capsule in AM, 1 capsule at night.  May take another 1  capsule as needed 90 capsule 3  . glucose blood (ACCU-CHEK AVIVA PLUS) test strip USE AS DIRECTED CHECK BLOOD SUGAR TWICE A DAY 200 each 3  . insulin lispro (HUMALOG) 100 UNIT/ML injection Inject into the skin 3 (three) times daily before meals.    . insulin NPH Human (HUMULIN N,NOVOLIN N) 100 UNIT/ML injection Inject 1 mL (100 Units total) into the skin 2 (two) times daily. And syringes 2/day 200 mL 11  . Insulin Syringe-Needle U-100 (B-D INS SYR ULTRAFINE 1CC/31G) 31G X 5/16" 1 ML MISC Used to give insulin injections twice daily. 100 each 11  . Magnesium 200 MG TABS Take 200 mg by mouth daily with lunch.     . mycophenolate (MYFORTIC) 180 MG EC tablet Take 180 mg by mouth 2 (two) times daily.    . nitroGLYCERIN (NITROSTAT) 0.4 MG SL tablet PLACE 1 TABLET (0.4 MG TOTAL) UNDER THE TONGUE EVERY 5 (FIVE) MINUTES AS NEEDED FOR CHEST PAIN. 25 tablet 2  . omeprazole (PRILOSEC) 20 MG capsule Take 1 capsule (20 mg total) by mouth 2 (two) times daily before a meal. 180 capsule 3  . tacrolimus (PROGRAF) 1 MG capsule     . Tacrolimus 1 MG CP24 Take 4 mg by mouth 2 (two) times daily.     . traMADol (ULTRAM) 50 MG tablet Take 1 tablet (50 mg total) by mouth every 8 (eight) hours as needed for moderate pain or severe pain. (Patient taking differently: Take 50 mg by mouth See admin instructions. Take 50 mg by mouth at bedtime and 50 mg two times a day as needed for pain) 60 tablet 1   No current facility-administered medications on file prior to visit.     ALLERGIES: No Known Allergies  FAMILY HISTORY: Family History  Problem Relation Age of Onset  . Heart disease Father   . Renal Disease Father   . Dementia Mother   . Heart attack Mother   . Diabetes Sister   . Heart disease Sister   . Diabetes Maternal Aunt   . Diabetes Maternal Uncle   . Diabetes Paternal Aunt   . Diabetes Paternal Uncle   . Heart disease Unknown   . Heart attack Brother   . Lung cancer Sister   . Renal Disease Brother     SOCIAL HISTORY: Social History   Socioeconomic History  . Marital status: Married    Spouse name: Elisha Headland  . Number of children: 3  . Years of education: Not on file  . Highest education level: Associate degree: academic program  Occupational History  . Occupation: disabled    Employer: DISABLED  Social Needs  . Financial  resource strain: Not on file  . Food insecurity:    Worry: Not on file    Inability: Not on file  . Transportation needs:    Medical: Not on file    Non-medical: Not on file  Tobacco Use  . Smoking status: Never Smoker  . Smokeless tobacco: Never Used  Substance and Sexual Activity  . Alcohol use: No  . Drug use: No  . Sexual activity: Not Currently  Lifestyle  . Physical activity:    Days per week: Not on file    Minutes per session: Not on file  . Stress: Not on file  Relationships  . Social connections:    Talks on phone: Not on file    Gets together: Not on file    Attends religious service: Not on file    Active member of club or organization: Not on file    Attends meetings of clubs or organizations: Not on file    Relationship status: Not on file  . Intimate partner violence:    Fear of current or ex partner: Not on file    Emotionally abused: Not on file    Physically abused: Not on file    Forced sexual activity: Not on file  Other Topics Concern  . Not on file  Social History Narrative   Married 3831. 6 year old son in 86. 1 granddaughter from [place].       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 He lives with his wife. He drinks 3-4 cups of coffee a day. He walks most every day.    REVIEW OF SYSTEMS: Constitutional: No fevers, chills, or sweats, no generalized fatigue, change in appetite Eyes: No visual changes, double vision, eye pain Ear, nose and throat: No  hearing loss, ear pain, nasal congestion, sore throat Cardiovascular: No chest pain, palpitations Respiratory:  No shortness of breath at rest or with exertion, wheezes GastrointestinaI: No nausea, vomiting, diarrhea, abdominal pain, fecal incontinence Genitourinary:  No dysuria, urinary retention or frequency Musculoskeletal:  No neck pain, back pain Integumentary: No rash, pruritus, skin lesions Neurological: as above Psychiatric: No depression, insomnia, anxiety Endocrine: No palpitations, fatigue, diaphoresis, mood swings, change in appetite, change in weight, increased thirst Hematologic/Lymphatic:  No purpura, petechiae. Allergic/Immunologic: no itchy/runny eyes, nasal congestion, recent allergic reactions, rashes  PHYSICAL EXAM: Blood pressure 120/68, pulse 85, height 5' 10.5" (1.791 m), weight 263 lb (119.3 kg), SpO2 96 %. General: No acute distress.  Patient appears well-groomed.   Head:  Normocephalic/atraumatic Eyes:  Fundi examined but not visualized Neck: supple, no paraspinal tenderness, full range of motion Heart:  Regular rate and rhythm Lungs:  Clear to auscultation bilaterally Back: No paraspinal tenderness Neurological Exam: alert and oriented to person, place, and time. Attention span and concentration intact, recent and remote memory intact, fund of knowledge intact.  Speech fluent and not dysarthric, language intact.  CN II-XII intact. Bulk and tone normal, muscle strength 5-/5 left upper extremity.  Otherwise, 5/5 throughout.  Sensation to pinprick and vibration reduced up to below the knees.  Deep tendon reflexes absent throughout, toes downgoing.  Finger to nose and heel to shin testing intact.  Wide-based unsteady gait.  Romberg with sway.  IMPRESSION: 1.  Diabetic polyneuropathy  PLAN: 1.  Increase gabapentin to 600 mg in the morning, 300 mg at  noon, and 600 mg at night. 2.  Advised to take caution when first standing up out of the bed due to proprioception  loss from his neuropathy. 3.  We discussed that he should never walk around the house without proper protection for his feet (he always wears house shoes). 4.  Follow-up in 4 months.  He was advised to contact us if he feels that the gabapentin needs to be further increased.  60 minutes spent with the patient, over 50% spent discussing management.  Metta Clines, DO  CC: Garret Reddish, MD

## 2018-04-06 NOTE — Telephone Encounter (Signed)
Called and spoke to patient who expressed his frustrations regarding his experience with Dr. Loanne Drilling. He states he has an appointment with Dr. Dwyane Dee this month. I explained to him after talking to Jackelyn Poling that the last visit we have documented is 12/03/17 and does not match what he reports he is taking (insulin) and the number of times he is taking his blood sugar. I advised him that he would need an appointment with Dr. Yong Channel that had documentation of his current regimen to send to Dr. Lamonte Sakai. He stated he sees Dr. Dwyane Dee next Tuesday so I advised him to have Dr. Dwyane Dee sign for the meter as Dr, Dwyane Dee would have the most up to date documentation of his insulin dosages and how many times a day he is checking his blood sugar. He verbalized understanding.

## 2018-04-06 NOTE — Telephone Encounter (Signed)
Called and spoke with Jackelyn Poling at [facility]. The information that the patient is reporting to her and what we have documented do not match. I advised her that I would call him and let him know that he needs to schedule an appointment as the last OV we have documented is 12/03/17.

## 2018-04-07 ENCOUNTER — Encounter: Payer: Self-pay | Admitting: Cardiology

## 2018-04-08 ENCOUNTER — Encounter: Payer: Self-pay | Admitting: Neurology

## 2018-04-08 ENCOUNTER — Ambulatory Visit (INDEPENDENT_AMBULATORY_CARE_PROVIDER_SITE_OTHER): Payer: Medicare Other | Admitting: Neurology

## 2018-04-08 VITALS — BP 120/68 | HR 85 | Ht 70.5 in | Wt 263.0 lb

## 2018-04-08 DIAGNOSIS — E1142 Type 2 diabetes mellitus with diabetic polyneuropathy: Secondary | ICD-10-CM | POA: Diagnosis not present

## 2018-04-08 MED ORDER — GABAPENTIN 300 MG PO CAPS: ORAL_CAPSULE | ORAL | 5 refills | Status: DC

## 2018-04-08 NOTE — Patient Instructions (Signed)
Increase gabapentin 300mg  to 2 capsules in morning, 1 capsule at noon and 2 capsules at night/bedtime Follow up in 4 months.  If you think gabapentin needs to be increased, contact me.

## 2018-04-26 DIAGNOSIS — M109 Gout, unspecified: Secondary | ICD-10-CM | POA: Diagnosis not present

## 2018-04-26 DIAGNOSIS — I129 Hypertensive chronic kidney disease with stage 1 through stage 4 chronic kidney disease, or unspecified chronic kidney disease: Secondary | ICD-10-CM | POA: Diagnosis not present

## 2018-04-26 DIAGNOSIS — Z940 Kidney transplant status: Secondary | ICD-10-CM | POA: Diagnosis not present

## 2018-04-26 LAB — HEPATIC FUNCTION PANEL
ALT: 13 (ref 10–40)
AST: 13 — AB (ref 14–40)
Alkaline Phosphatase: 130 — AB (ref 25–125)
Bilirubin, Total: 0.5

## 2018-04-26 LAB — CBC AND DIFFERENTIAL
HCT: 43 (ref 41–53)
Hemoglobin: 14 (ref 13.5–17.5)
PLATELETS: 251 (ref 150–399)
WBC: 3.8

## 2018-04-26 LAB — BASIC METABOLIC PANEL
BUN: 16 (ref 4–21)
Creatinine: 1.5 — AB (ref 0.6–1.3)
Glucose: 182
Potassium: 4 (ref 3.4–5.3)
Sodium: 141 (ref 137–147)

## 2018-04-27 DIAGNOSIS — E785 Hyperlipidemia, unspecified: Secondary | ICD-10-CM | POA: Diagnosis not present

## 2018-04-27 DIAGNOSIS — N2581 Secondary hyperparathyroidism of renal origin: Secondary | ICD-10-CM | POA: Diagnosis not present

## 2018-04-27 DIAGNOSIS — E1129 Type 2 diabetes mellitus with other diabetic kidney complication: Secondary | ICD-10-CM | POA: Diagnosis not present

## 2018-04-27 DIAGNOSIS — I129 Hypertensive chronic kidney disease with stage 1 through stage 4 chronic kidney disease, or unspecified chronic kidney disease: Secondary | ICD-10-CM | POA: Diagnosis not present

## 2018-04-27 DIAGNOSIS — Z940 Kidney transplant status: Secondary | ICD-10-CM | POA: Diagnosis not present

## 2018-05-06 ENCOUNTER — Encounter: Payer: Self-pay | Admitting: Family Medicine

## 2018-05-27 ENCOUNTER — Ambulatory Visit: Payer: Medicare Other | Admitting: Podiatry

## 2018-05-30 ENCOUNTER — Ambulatory Visit: Payer: Medicare Other | Admitting: Family Medicine

## 2018-06-03 ENCOUNTER — Encounter: Payer: Self-pay | Admitting: Family Medicine

## 2018-06-03 ENCOUNTER — Ambulatory Visit (INDEPENDENT_AMBULATORY_CARE_PROVIDER_SITE_OTHER): Payer: Medicare Other | Admitting: Family Medicine

## 2018-06-03 VITALS — BP 108/70 | HR 101 | Temp 98.7°F | Ht 70.5 in | Wt 256.6 lb

## 2018-06-03 DIAGNOSIS — E0843 Diabetes mellitus due to underlying condition with diabetic autonomic (poly)neuropathy: Secondary | ICD-10-CM | POA: Diagnosis not present

## 2018-06-03 DIAGNOSIS — J069 Acute upper respiratory infection, unspecified: Secondary | ICD-10-CM

## 2018-06-03 DIAGNOSIS — I10 Essential (primary) hypertension: Secondary | ICD-10-CM

## 2018-06-03 DIAGNOSIS — Z1159 Encounter for screening for other viral diseases: Secondary | ICD-10-CM

## 2018-06-03 DIAGNOSIS — E1142 Type 2 diabetes mellitus with diabetic polyneuropathy: Secondary | ICD-10-CM

## 2018-06-03 DIAGNOSIS — E785 Hyperlipidemia, unspecified: Secondary | ICD-10-CM

## 2018-06-03 MED ORDER — BENZONATATE 100 MG PO CAPS: 100.0000 mg | ORAL_CAPSULE | Freq: Two times a day (BID) | ORAL | 0 refills | Status: DC | PRN

## 2018-06-03 NOTE — Addendum Note (Signed)
Addended by: Francis Dowse T on: 06/03/2018 03:07 PM   Modules accepted: Orders

## 2018-06-03 NOTE — Patient Instructions (Addendum)
Health Maintenance Due  Topic Date Due  . Hepatitis C Screening - with labs today 12/15/48  . TETANUS/TDAP - consider getting at your pharmacy when better 11[birth year]1967  . COLONOSCOPY - Sign release of information at the check out desk for last colonoscopy from [facility].   11/23/1998  . OPHTHALMOLOGY EXAM - Sign release of information at the check out desk for last eye exam from the [facility]  10/13/2017  . INFLUENZA VACCINE - once you get to feeling better 12/23/2017   Try Tessalon pearls for cough associated with upper respiratory infection.  If your symptoms worsen-particularly shortness of breath or fever or last over 10 days-we definitely need to see you back immediately to reevaluate.  For most people these types of symptoms are lasting about 2 weeks-due to your transplant history yours may last more towards 3 weeks but would still want to reevaluate in 10 days if persistent  Can schedule a 4 to 38-month physical   Please stop by lab before you go

## 2018-06-03 NOTE — Assessment & Plan Note (Signed)
S: Mild poorly controlled on last check on atorvastatin 40 mg.  Due to history of stroke he remains on Plavix 75 mg Lab Results  Component Value Date   CHOL 198 02/16/2017   HDL 46 02/16/2017   LDLCALC 85 02/16/2017   LDLDIRECT 159.2 07/29/2011   TRIG 333 (A) 02/16/2017   CHOLHDL 5 07/29/2011   A/P: Need to update full lipid panel-hopefully LDL under 70

## 2018-06-03 NOTE — Progress Notes (Signed)
Subjective:  [name] is a 68 y.o. year old very pleasant male patient who presents for/with See problem oriented charting ROS-has some cough and congestion, mildly short of breath and winded.  No chest pain reported.  No edema.  Past Medical History-  Patient Active Problem List   Diagnosis Date Noted  . Morbid obesity ([place]) 12/04/2017    Priority: High  . 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 Renal transplant recipient 06/27/2015    Priority: High  . Ascending aorta dilatation-4.7 cm per [facility] 2014 10/23/2014    Priority: High  . Diabetic neuropathy ([place]) 10/14/2010    Priority: High  . CAD -s/p DES to marginal vessel at [facility] 04/09/2009    Priority: High  . Diabetes mellitus due to underlying condition with diabetic autonomic (poly)neuropathy ([place]) 09/05/2007    Priority: High  . Hyperlipidemia 01/16/2016    Priority: Medium  . Gout 12/22/2009    Priority: Medium  . Obstructive sleep apnea 07/10/2009    Priority: Medium  . BPH (benign prostatic hyperplasia) 09/25/2008    Priority: Medium  . Low back pain 12/15/2007    Priority: Medium  . Posttraumatic stress disorder 11/03/2007    Priority: Medium  . Essential hypertension 01/17/2007    Priority: Medium  . CVA (cerebral infarction) 01/17/2007    Priority: Medium  . GERD (gastroesophageal reflux disease) 05/16/2014    Priority: Low  . Trigger thumb of right hand 01/03/2014    Priority: Low  . Left foot drop 05/02/2013    Priority: Low  . Risk for falls 01/22/2012    Priority: Low  . Gastroparesis 06/19/2008    Priority: Low  . Allergic rhinitis 03/01/2007    Priority: Low  . Orthostatic hypotension 01/15/2015  . Fall at home 01/15/2015    Medications- reviewed and updated Current Outpatient Medications  Medication Sig Dispense Refill  . acetaminophen (TYLENOL) 500 MG tablet Take 1,000 mg by mouth as needed for mild pain or  headache.     . allopurinol (ZYLOPRIM) 100 MG tablet Take 100 mg by mouth 2 (two) times daily.     [name] amLODipine (NORVASC) 10 MG tablet Take 10 mg by mouth daily.    [name] aspirin 81 MG tablet Take 81 mg by mouth at bedtime.     [name] atorvastatin (LIPITOR) 40 MG tablet Take 40 mg by mouth daily.    . clopidogrel (PLAVIX) 75 MG tablet Take 75 mg by mouth daily.     . Continuous Blood Gluc Receiver (FREESTYLE LIBRE 14 DAY READER) DEVI 1 Device by Does not apply route daily. (Patient not taking: Reported on 12/28/2017) 1 Device 0  . Continuous Blood Gluc Sensor (FREESTYLE LIBRE 14 DAY SENSOR) MISC Place 1 Units onto the skin every 14 (fourteen) days. 3 each 11  . cyclobenzaprine (FLEXERIL) 5 MG tablet Take 0.5-1 tablets (2.5-5 mg total) 3 (three) times daily as needed by mouth for muscle spasms. (Patient not taking: Reported on 12/28/2017) 30 tablet 0  . diazepam (VALIUM) 5 MG tablet Take 1 tablet (5 mg total) by mouth at bedtime. 30 tablet 0  . furosemide (LASIX) 40 MG tablet Take 1 tablet (40 mg total) by mouth daily. (Patient taking differently: Take 20 mg by mouth daily. ) 30 tablet   . gabapentin (NEURONTIN) 300 MG capsule Take 2 capsules in AM, 1 capsule at noon and 2 capsules at bedtime 150 capsule 5  . glucose blood (ACCU-CHEK AVIVA  PLUS) test strip USE AS DIRECTED CHECK BLOOD SUGAR TWICE A DAY 200 each 3  . insulin lispro (HUMALOG) 100 UNIT/ML injection Inject into the skin 3 (three) times daily before meals.    . Insulin Syringe-Needle U-100 (B-D INS SYR ULTRAFINE 1CC/31G) 31G X 5/16" 1 ML MISC Used to give insulin injections twice daily. 100 each 11  . Magnesium 200 MG TABS Take 200 mg by mouth daily with lunch.     . mycophenolate (MYFORTIC) 180 MG EC tablet Take 180 mg by mouth 2 (two) times daily.    . nitroGLYCERIN (NITROSTAT) 0.4 MG SL tablet PLACE 1 TABLET (0.4 MG TOTAL) UNDER THE TONGUE EVERY 5 (FIVE) MINUTES AS NEEDED FOR CHEST PAIN. 25 tablet 2  . omeprazole (PRILOSEC) 20 MG capsule Take 1  capsule (20 mg total) by mouth 2 (two) times daily before a meal. 180 capsule 3  . tacrolimus (PROGRAF) 1 MG capsule     . Tacrolimus 1 MG CP24 Take 4 mg by mouth 2 (two) times daily.     . traMADol (ULTRAM) 50 MG tablet Take 1 tablet (50 mg total) by mouth every 8 (eight) hours as needed for moderate pain or severe pain. (Patient taking differently: Take 50 mg by mouth See admin instructions. Take 50 mg by mouth at bedtime and 50 mg two times a day as needed for pain) 60 tablet 1   Objective: BP 108/70 (BP Location: Left Arm, Patient Position: Sitting, Cuff Size: Large)   Pulse (!) 101   Temp 98.7 F (37.1 C) (Oral)   Ht 5' 10.5" (1.791 m)   Wt 256 lb 9.6 oz (116.4 kg)   SpO2 95%   BMI 36.30 kg/m  Gen: NAD, resting comfortably Erythematous nasal turbinates with clear discharge, pharynx mildly erythematous, tympanic membranes normal bilaterally CV: RRR-not bradycardic on my exam-no murmurs rubs or gallops Lungs: CTAB no crackles, wheeze, rhonchi Abdomen: soft/nontender/nondistended/normal bowel sounds. No rebound or guarding.  Ext: no edema Skin: warm, dry  Assessment/Plan:  Other notes: 1.  Patient doing well post transplant- remains on Myfortic and tacrolimus 2.  Will get hepatitis C screening with labs today 3.  Should get tetanus shot at pharmacy.  Flu shot needed when not ill 4.  [facility] 5.  Colonoscopy reports had before transplant- will try to get records   Viral URI  s:  URI symptoms started on Monday (day 5 today)- chest congestion, runny nose now better- tried some nasocort which helped. His back and sides hurt. Dry cough. Feels mild dizziness and some shortness of breath. In bed last 2 days. No sinus pressure.   Patient's symptoms started shortly after his son-he was recently treated for upper respiratory infection in our clinic A/P: This looks like a viral URI-patient is higher risk given transplant and immunosuppression-gave  him strict return precautions-particularly fever, worsening shortness of breath, failure to improve over 10 days- we will trial Tessalon Perles to see if that helps him   Essential hypertension S: controlled on amlodipine 10 mg, Lasix 20 mg daily  BP Readings from Last 3 Encounters:  04/08/18 120/68  01/04/18 110/68  12/28/17 132/72  A/P: We discussed blood pressure goal of <140/90. Continue current meds  Hyperlipidemia S: Mild poorly controlled on last check on atorvastatin 40 mg.  Due to history of stroke he remains on Plavix 75 mg Lab Results  Component Value Date   CHOL 198 02/16/2017   HDL 46 02/16/2017  [place] 85 02/16/2017   LDLDIRECT 159.2 07/29/2011   TRIG 333 (A) 02/16/2017   CHOLHDL 5 07/29/2011   A/P: Need to update full lipid panel-hopefully LDL under 70  Diabetes mellitus due to underlying condition with diabetic autonomic (poly)neuropathy ([place]) S: [facility]- [facility] 1g BID and he is still on lantus 75 units BID- he states sugars have improved. Lowest 107 and highest 157. He is down 6-7 lbs.  A/P: he states sugars improving- will get an a1c with labs -We will try to get records from the [place] for last eye exam   Future Appointments  Date Time Provider [place]  08/19/2018 10:30 AM Pieter Partridge, DO [facility] None   Advised 3-month follow-up Lab/Order associations: Essential hypertension  Hyperlipidemia, unspecified hyperlipidemia type  Diabetes mellitus due to underlying condition with diabetic autonomic neuropathy, without long-term current use of insulin ([place]) - Plan: Comprehensive metabolic panel, Hemoglobin A1c, Lipid panel  Viral URI  Encounter for hepatitis C screening test for low risk patient - Plan: Hepatitis C antibody  Return precautions advised.  Garret Reddish, MD

## 2018-06-03 NOTE — Assessment & Plan Note (Signed)
S: Remains on gabapentin for diabetic neuropathy- pain in bilateral lower extremities.  May have a radicular component.  This is now being managed by Dr. Tomi Likens of [facility] A/P: Stable-continue neurology follow-up

## 2018-06-03 NOTE — Assessment & Plan Note (Signed)
S: controlled on amlodipine 10 mg, Lasix 20 mg daily  BP Readings from Last 3 Encounters:  04/08/18 120/68  01/04/18 110/68  12/28/17 132/72  A/P: We discussed blood pressure goal of <140/90. Continue current meds

## 2018-06-03 NOTE — Assessment & Plan Note (Addendum)
S: [facility]- [facility] started metformin 1g BID and he is still on lantus 75 units BID- he states sugars have improved. Lowest 107 and highest 157. He is down 6-7 lbs.  A/P: he states sugars improving- will get an a1c with labs -We will try to get records from the [place] for last eye exam

## 2018-06-04 LAB — LIPID PANEL
Cholesterol: 128 mg/dL (ref <200)
HDL: 35 mg/dL — ABNORMAL LOW (ref >40)
LDL Cholesterol (Calc): 66 mg/dL (calc)
NON-HDL CHOLESTEROL (CALC): 93 mg/dL (ref <130)
Total CHOL/HDL Ratio: 3.7 (calc) (ref <5.0)
Triglycerides: 199 mg/dL — ABNORMAL HIGH (ref <150)

## 2018-06-04 LAB — HEPATITIS C ANTIBODY
Hepatitis C Ab: NONREACTIVE
SIGNAL TO CUT-OFF: 0.21 (ref <1.00)

## 2018-06-04 LAB — COMPREHENSIVE METABOLIC PANEL
AG Ratio: 1.4 (calc) (ref 1.0–2.5)
ALT: 16 U/L (ref 9–46)
AST: 18 U/L (ref 10–35)
Albumin: 3.8 g/dL (ref 3.6–5.1)
Alkaline phosphatase (APISO): 95 U/L (ref 40–115)
BUN/Creatinine Ratio: 12 (calc) (ref 6–22)
BUN: 23 mg/dL (ref 7–25)
CHLORIDE: 99 mmol/L (ref 98–110)
CO2: 26 mmol/L (ref 20–32)
Calcium: 9.2 mg/dL (ref 8.6–10.3)
Creat: 1.95 mg/dL — ABNORMAL HIGH (ref 0.70–1.25)
Globulin: 2.8 g/dL (calc) (ref 1.9–3.7)
Glucose, Bld: 238 mg/dL — ABNORMAL HIGH (ref 65–99)
Potassium: 4.2 mmol/L (ref 3.5–5.3)
SODIUM: 139 mmol/L (ref 135–146)
TOTAL PROTEIN: 6.6 g/dL (ref 6.1–8.1)
Total Bilirubin: 0.9 mg/dL (ref 0.2–1.2)

## 2018-06-04 LAB — HEMOGLOBIN A1C
Hgb A1c MFr Bld: 9 % of total Hgb — ABNORMAL HIGH (ref <5.7)
Mean Plasma Glucose: 212 (calc)
eAG (mmol/L): 11.7 (calc)

## 2018-07-11 DIAGNOSIS — I129 Hypertensive chronic kidney disease with stage 1 through stage 4 chronic kidney disease, or unspecified chronic kidney disease: Secondary | ICD-10-CM | POA: Diagnosis not present

## 2018-07-11 DIAGNOSIS — Z940 Kidney transplant status: Secondary | ICD-10-CM | POA: Diagnosis not present

## 2018-07-25 DIAGNOSIS — Z940 Kidney transplant status: Secondary | ICD-10-CM | POA: Diagnosis not present

## 2018-08-03 ENCOUNTER — Ambulatory Visit: Payer: Self-pay | Admitting: Family Medicine

## 2018-08-03 NOTE — Telephone Encounter (Signed)
Pt called in c/o having diarrhea with some vomiting since 9:00 PM last night.   Not vomiting this morning but still having diarrhea.   He is c/o feeling very weak.   He is a kidney transplant pt.  Also diabetic.   Glucose is 97 this morning.   He uses insulin.   Not eating but is drinking fluids.  While triaging him he mentioned his stools are dark and very sticky.  See notes below.  I called Dr. Ansel Bong office at pt's request.   "I'm not going to the ED and my insurance will not cover me to go to the Urgent Care".    I spoke with the flow coordinator.   There are no openings at [facility] practice today.    I offered him an appt at another [facility] practice however he refused.    "I'm just going to wait it out".   I let him know he should not just wait this out.   He is too sick and has too many risk factors.  He agreed to have his son take him to the [facility] hospital.   "I can just go in there and they will see me and it's covered".    I let him know that was a good idea so he is going to go on to the [facility] hospital.  I sent these notes to Dr. Ansel Bong office so he would be aware.    Reason for Disposition . Patient sounds very sick or weak to the triager  Answer Assessment - Initial Assessment Questions 1. SYMPTOMS: "Do you have any symptoms?"     I am on Metformin 1,000 mg a day.  I've been on it for 3 months.    I'm having diarrhea and vomiting since 9:00 PM last night.   No vomiting now just the diarrhea.   Just dry heaves.   No fever I checked it.   Denies sore throat or runny nose.   Diarrhea all night.   Still having diarrhea and vomited once while on the toilet at 4:00 AM.    2. SEVERITY: If symptoms are present, ask "Are they mild, moderate or severe?"     I'm so weak this morning.  I also use insulin.   Glucose 97 this morning.   I'm drinking water.   Smell of food makes me nauseated.  Answer Assessment - Initial Assessment Questions 1. DIARRHEA SEVERITY: "How bad is the diarrhea?"  "How many extra stools have you had in the past 24 hours than normal?"    - NO DIARRHEA (SCALE 0)   - MILD (SCALE 1-3): Few loose or mushy BMs; increase of 1-3 stools over normal daily number of stools; mild increase in ostomy output.   -  MODERATE (SCALE 4-7): Increase of 4-6 stools daily over normal; moderate increase in ostomy output. * SEVERE (SCALE 8-10; OR 'WORST POSSIBLE'): Increase of 7 or more stools daily over normal; moderate increase in ostomy output; incontinence.     Having diarrhea since 9:00 last night. 2. ONSET: "When did the diarrhea begin?"      Last night 3. BM CONSISTENCY: "How loose or watery is the diarrhea?"      Watery and sticky.   It's real dark.  I have to clean the toilet afterwards because it's so sticky.    Stinks but nothing unusual.    My urine is strong smelling.    Had a kidney transplant.   Smells foul.  4. VOMITING: "Are you also vomiting?" If so, ask: "How many times in the past 24 hours?"      Vomited last night 5. ABDOMINAL PAIN: "Are you having any abdominal pain?" If yes: "What does it feel like?" (e.g., crampy, dull, intermittent, constant)      It's a constant urge to go to the bathroom. 6. ABDOMINAL PAIN SEVERITY: If present, ask: "How bad is the pain?"  (e.g., Scale 1-10; mild, moderate, or severe)   - MILD (1-3): doesn't interfere with normal activities, abdomen soft and not tender to touch    - MODERATE (4-7): interferes with normal activities or awakens from sleep, tender to touch    - SEVERE (8-10): excruciating pain, doubled over, unable to do any normal activities       No pain. 7. ORAL INTAKE: If vomiting, "Have you been able to drink liquids?" "How much fluids have you had in the past 24 hours?"     I've got a 32 oz container of Ginger Ale.   I drank 2 bottles of water this morning.    No coffee. 8. HYDRATION: "Any signs of dehydration?" (e.g., dry mouth [not just dry lips], too weak to stand, dizziness, new weight loss) "When did you last  urinate?"     I just feel so weak.   I stumble because I'm so weak.    9. EXPOSURE: "Have you traveled to a foreign country recently?" "Have you been exposed to anyone with diarrhea?" "Could you have eaten any food that was spoiled?"     No travel.   No exposure to sick people 10. ANTIBIOTIC USE: "Are you taking antibiotics now or have you taken antibiotics in the past 2 months?"       No 11. OTHER SYMPTOMS: "Do you have any other symptoms?" (e.g., fever, blood in stool)       No blood.   Sticky stool.    12. PREGNANCY: "Is there any chance you are pregnant?" "When was your last menstrual period?"       N/A  Protocols used: DIARRHEA-A-AH, MEDICATION QUESTION CALL-A-AH

## 2018-08-03 NOTE — Telephone Encounter (Signed)
See note

## 2018-08-03 NOTE — Telephone Encounter (Signed)
Please see message . Thank you .

## 2018-08-04 NOTE — Telephone Encounter (Signed)
Trip to [facility] make sense- Thanks so much

## 2018-08-10 DIAGNOSIS — M109 Gout, unspecified: Secondary | ICD-10-CM | POA: Diagnosis not present

## 2018-08-10 DIAGNOSIS — I129 Hypertensive chronic kidney disease with stage 1 through stage 4 chronic kidney disease, or unspecified chronic kidney disease: Secondary | ICD-10-CM | POA: Diagnosis not present

## 2018-08-10 DIAGNOSIS — Z940 Kidney transplant status: Secondary | ICD-10-CM | POA: Diagnosis not present

## 2018-08-16 ENCOUNTER — Telehealth: Payer: Self-pay

## 2018-08-16 NOTE — Telephone Encounter (Signed)
Called and spoke with Pt about e-visit. He is expecting a call to schedule. Confirmed email.

## 2018-08-18 ENCOUNTER — Telehealth (INDEPENDENT_AMBULATORY_CARE_PROVIDER_SITE_OTHER): Payer: Medicare Other | Admitting: Neurology

## 2018-08-18 ENCOUNTER — Other Ambulatory Visit: Payer: Self-pay

## 2018-08-18 ENCOUNTER — Telehealth: Payer: Medicare Other | Admitting: Nurse Practitioner

## 2018-08-18 VITALS — BP 125/87 | Temp 97.5°F | Wt 247.0 lb

## 2018-08-18 DIAGNOSIS — G8929 Other chronic pain: Secondary | ICD-10-CM

## 2018-08-18 DIAGNOSIS — E1142 Type 2 diabetes mellitus with diabetic polyneuropathy: Secondary | ICD-10-CM

## 2018-08-18 DIAGNOSIS — M549 Dorsalgia, unspecified: Principal | ICD-10-CM

## 2018-08-18 MED ORDER — GABAPENTIN 300 MG PO CAPS: ORAL_CAPSULE | ORAL | 5 refills | Status: DC

## 2018-08-18 NOTE — Progress Notes (Addendum)
Virtual Visit via Video Note The purpose of this virtual visit is to provide medical care while limiting exposure to the novel coronavirus.    Consent was obtained for video visit:  Yes.   Answered questions that patient had about telehealth interaction:  Yes.   I discussed the limitations, risks, security and privacy concerns of performing an evaluation and management service by telemedicine. I also discussed with the patient that there may be a patient responsible charge related to this service. The patient expressed understanding and agreed to proceed.  Pt location: Home Physician Location: office Name of referring provider:  [name], MD I connected with [name] at patients initiation/request on 08/18/2018 at  1:00 PM EDT by video enabled telemedicine application and verified that I am speaking with the correct person using two identifiers. Pt MRN:  [id number] Pt DOB:  [birth year]   History of Present Illness:  [name] is a 68 year old right-handed man with type 2 diabetes mellitus complicated by polyneuropathy, renal failure status post transplant 2017, gastroparesis, CHF, CAD, and history of stroke who follows up for diabetic polyneuropathy.  UPDATE: Current medication:  Gabapentin 600mg /300mg /600mg ; tramadol 50mg  PRN for breakthrough pain.  Pain in left leg (back of leg/shin/foot) has been getting worse, particularly at night.  Yesterday, he was walking to get the mail and his right knee gave-out and he fell.  He stated that the leg felt numb.  The leg wasn't plegic, rather his knee just gave-out.  When he was on the ground, he was able to move his leg.  Numbness lasted about an hour and he had difficulty bearing weight.  He needed to use his walker.  He did not have associated facial droop, slurred speech, language dysfunction or involvement of upper extremity.  He still feels that his right knee is unstable.  He has chronic low back pain.  06/03/18 Hgb A1c 9.0.  HISTORY: He has history lumbar radiculopathy down both hips and legs (worse on the left) since injuring his back in the [employer] many years ago. MRI of lumbar spine from 06/16/13 showed disc bulge and facet arthropathy with moderate biforaminal narrowing at L5-S1. In the past he would get epidural injections which helped for about 6 months. He has diabetic neuropathy and has numbness and pain in the feet. He also has gout in the big toes. He has problems with balance. He has some residual weakness in the right leg due to stroke. 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 He has associated numbness.     Observations/Objective:   Blood pressure 125/87, temperature (!) 97.5 F (36.4 C), weight 247 lb (112 kg). alert and oriented to person, place, and time. Attention span and concentration intact, recent and remote memory intact, fund of knowledge intact.  Speech fluent and not dysarthric, language intact.  Pupils round and equal.  Eyes move in all directions.  Face symmetric.  Facial sensation intact.  Tongue midline.  Able to move all extremities against gravity.  Finger to nose testing intact.  Wide-based unsteady gait.  Romberg with sway.  Assessment and Plan:   1.  Diabetic polyneuropathy 2.  Lumbosacral radiculopathy.  I suspect the right leg pain and numbness was likely due to a radiculopathy.  He has known lumbar disc bulges and neuroforaminal narrowing irritating nerve roots.  He didn't have any other features to suspect TIA.  1.  I will increase bedtime dose of gabapentin so he will be taking  600mg  in AM, 300mg  in afternoon and 900mg  at bedtime.  He has follow up with his nephrologist next month. 2.  When restrictions due to the [place] pandemic settle, we can refer him to physical therapy if needed. 3.  Follow up in 6 months   Follow Up Instructions:    -I discussed the assessment and treatment plan with the patient. The patient was provided an opportunity to ask  questions and all were answered. The patient agreed with the plan and demonstrated an understanding of the instructions.   The patient was advised to call back or seek an in-person evaluation if the symptoms worsen or if the condition fails to improve as anticipated.    Total Time spent in visit with the patient was:  17 minutes, of which more than 50% of the time was spent in counseling and/or coordinating care on above.   Pt understands and agrees with the plan of care outlined.     Dudley Major, DO

## 2018-08-18 NOTE — Addendum Note (Signed)
Addended byTomi Likens, Haleemah Buckalew R on: 08/18/2018 02:15 PM   Modules accepted: Orders

## 2018-08-19 ENCOUNTER — Ambulatory Visit: Payer: Medicare Other | Admitting: Neurology

## 2018-08-19 NOTE — Progress Notes (Signed)
Based on what you shared with me, I feel your condition warrants further evaluation and I recommend that you be seen for a face to face office visit.     NOTE: If you entered your credit card information for this eVisit, you will not be charged. You may see a "hold" on your card for the $35 but that hold will drop off and you will not have a charge processed.  If you are having a true medical emergency please call 911.  If you need an urgent face to face visit, [place] has four urgent care centers for your convenience.    PLEASE NOTE: THE [facility] LOCATIONS AND URGENT CARE CLINICS DO NOT HAVE THE TESTING FOR CORONAVIRUS COVID19 AVAILABLE.  IF YOU FEEL YOU NEED THIS TEST YOU MUST HAVE AN ORDER TO GO TO A TESTING LOCATION FROM YOUR PROVIDER OR FROM A SCREENING E-VISIT     [email or web address] to reserve your spot online an avoid wait times  [facility] [address], [address] [place], [place] [address] 8 am to 8 pm Monday-Friday 10 am to 4 pm Saturday-Sunday *Across the street from [employer]  [facility]  [address] [place], [address] 8 am to 5 pm Monday-Friday * In the [facility] the [facility]   The following sites will take your insurance:  . [facility]  [phone] Get Driving Directions Find a Provider at this Location  [address] [place], [place] [address] . 10 am to 8 pm Monday-Friday . 12 pm to 8 pm Saturday-Sunday   . [facility]  [phone] Get Driving Directions Find a Provider at this Location  [address], [address] [place], [place] [address] . 8 am to 8 pm Monday-Friday . 9 am to 6 pm Saturday . 11 am to 6 pm Sunday   . [facility] [place]  [phone] Get Driving Directions  [address] [place], [place] [address] . 8 am to 8 pm Monday-Friday . 8 am to 4 pm Saturday-Sunday   Your  e-visit answers were reviewed by a board certified advanced clinical practitioner to complete your personal care plan.  Thank you for using e-Visits. 

## 2018-08-24 DIAGNOSIS — M792 Neuralgia and neuritis, unspecified: Secondary | ICD-10-CM | POA: Diagnosis not present

## 2018-08-24 DIAGNOSIS — Z955 Presence of coronary angioplasty implant and graft: Secondary | ICD-10-CM | POA: Diagnosis not present

## 2018-08-24 DIAGNOSIS — E119 Type 2 diabetes mellitus without complications: Secondary | ICD-10-CM | POA: Diagnosis not present

## 2018-08-24 DIAGNOSIS — D702 Other drug-induced agranulocytosis: Secondary | ICD-10-CM | POA: Diagnosis not present

## 2018-08-24 DIAGNOSIS — Z4822 Encounter for aftercare following kidney transplant: Secondary | ICD-10-CM | POA: Diagnosis not present

## 2018-08-24 DIAGNOSIS — I712 Thoracic aortic aneurysm, without rupture: Secondary | ICD-10-CM | POA: Diagnosis not present

## 2018-08-24 DIAGNOSIS — I2510 Atherosclerotic heart disease of native coronary artery without angina pectoris: Secondary | ICD-10-CM | POA: Diagnosis not present

## 2018-08-24 DIAGNOSIS — E785 Hyperlipidemia, unspecified: Secondary | ICD-10-CM | POA: Diagnosis not present

## 2018-08-24 DIAGNOSIS — D649 Anemia, unspecified: Secondary | ICD-10-CM | POA: Diagnosis not present

## 2018-08-24 DIAGNOSIS — D8989 Other specified disorders involving the immune mechanism, not elsewhere classified: Secondary | ICD-10-CM | POA: Diagnosis not present

## 2018-08-24 DIAGNOSIS — I119 Hypertensive heart disease without heart failure: Secondary | ICD-10-CM | POA: Diagnosis not present

## 2018-08-24 DIAGNOSIS — B349 Viral infection, unspecified: Secondary | ICD-10-CM | POA: Diagnosis not present

## 2018-08-24 DIAGNOSIS — M79602 Pain in left arm: Secondary | ICD-10-CM | POA: Diagnosis not present

## 2018-08-24 DIAGNOSIS — I770 Arteriovenous fistula, acquired: Secondary | ICD-10-CM | POA: Diagnosis not present

## 2018-08-24 DIAGNOSIS — Z7982 Long term (current) use of aspirin: Secondary | ICD-10-CM | POA: Diagnosis not present

## 2018-08-24 DIAGNOSIS — Z79899 Other long term (current) drug therapy: Secondary | ICD-10-CM | POA: Diagnosis not present

## 2018-08-24 DIAGNOSIS — M109 Gout, unspecified: Secondary | ICD-10-CM | POA: Diagnosis not present

## 2018-08-24 DIAGNOSIS — Z940 Kidney transplant status: Secondary | ICD-10-CM | POA: Diagnosis not present

## 2018-08-24 DIAGNOSIS — Z9181 History of falling: Secondary | ICD-10-CM | POA: Diagnosis not present

## 2018-08-24 DIAGNOSIS — Z794 Long term (current) use of insulin: Secondary | ICD-10-CM | POA: Diagnosis not present

## 2018-08-24 DIAGNOSIS — I10 Essential (primary) hypertension: Secondary | ICD-10-CM | POA: Diagnosis not present

## 2018-08-26 NOTE — Progress Notes (Signed)
Added it to note

## 2018-09-13 ENCOUNTER — Other Ambulatory Visit: Payer: Self-pay | Admitting: Neurology

## 2018-09-16 DIAGNOSIS — Z940 Kidney transplant status: Secondary | ICD-10-CM | POA: Diagnosis not present

## 2018-09-16 DIAGNOSIS — E559 Vitamin D deficiency, unspecified: Secondary | ICD-10-CM | POA: Diagnosis not present

## 2018-09-16 DIAGNOSIS — I129 Hypertensive chronic kidney disease with stage 1 through stage 4 chronic kidney disease, or unspecified chronic kidney disease: Secondary | ICD-10-CM | POA: Diagnosis not present

## 2018-09-20 ENCOUNTER — Encounter: Payer: Self-pay | Admitting: Family Medicine

## 2018-09-20 NOTE — Telephone Encounter (Signed)
Pt scheduled for tomorrow

## 2018-09-21 ENCOUNTER — Ambulatory Visit (INDEPENDENT_AMBULATORY_CARE_PROVIDER_SITE_OTHER): Payer: Medicare Other | Admitting: Family Medicine

## 2018-09-21 ENCOUNTER — Encounter: Payer: Self-pay | Admitting: Family Medicine

## 2018-09-21 VITALS — BP 120/82 | Temp 97.8°F | Ht 70.0 in | Wt 247.0 lb

## 2018-09-21 DIAGNOSIS — I77810 Thoracic aortic ectasia: Secondary | ICD-10-CM

## 2018-09-21 DIAGNOSIS — E0843 Diabetes mellitus due to underlying condition with diabetic autonomic (poly)neuropathy: Secondary | ICD-10-CM | POA: Diagnosis not present

## 2018-09-21 DIAGNOSIS — K219 Gastro-esophageal reflux disease without esophagitis: Secondary | ICD-10-CM | POA: Diagnosis not present

## 2018-09-21 DIAGNOSIS — D689 Coagulation defect, unspecified: Secondary | ICD-10-CM | POA: Insufficient documentation

## 2018-09-21 DIAGNOSIS — M48061 Spinal stenosis, lumbar region without neurogenic claudication: Secondary | ICD-10-CM | POA: Diagnosis not present

## 2018-09-21 DIAGNOSIS — E1142 Type 2 diabetes mellitus with diabetic polyneuropathy: Secondary | ICD-10-CM

## 2018-09-21 DIAGNOSIS — Z794 Long term (current) use of insulin: Secondary | ICD-10-CM

## 2018-09-21 DIAGNOSIS — Z6835 Body mass index (BMI) 35.0-35.9, adult: Secondary | ICD-10-CM

## 2018-09-21 MED ORDER — OMEPRAZOLE 20 MG PO CPDR: 20.0000 mg | DELAYED_RELEASE_CAPSULE | Freq: Two times a day (BID) | ORAL | 3 refills | Status: DC

## 2018-09-21 NOTE — Assessment & Plan Note (Signed)
I strongly wonder whether neuropathy contributes to patient's sensation of weakness- would consider this more strongly if no improvement if injections are completed.

## 2018-09-21 NOTE — Patient Instructions (Signed)
Health Maintenance Due  Topic Date Due  . TETANUS/TDAP Please call Dr.Dunaham office to have them send [place] copies of immunization  11/23/1969  . COLONOSCOPY  11/23/1998  . OPHTHALMOLOGY EXAM [facility] [place] [facility] 10/13/2017    Depression screen [facility] 2/9 06/03/2018 01/28/2017 01/16/2016  Decreased Interest 0 0 0  Down, Depressed, Hopeless 0 0 0  PHQ - 2 Score 0 0 0  Some recent data might be hidden

## 2018-09-21 NOTE — Assessment & Plan Note (Signed)
S:Patient states he had a flare up in his back pain- seems to cause issues with his walking and difficulty being mobile. Lower back and his left leg related to neuropathy is really causing him issues. [place] neurologist increased gabapentin to 900mg  3x a day - seems to have helped with the pain.   He is trying not to walk with a cane or walker but keeps cane in the car. Does use walker in the house at times- feels weak in the legs and has to turn around and sit on the walker.  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 Tomi Likens "1.  Diabetic polyneuropathy 2.  Lumbosacral radiculopathy.  I suspect the right leg pain and numbness was likely due to a radiculopathy.  He has known lumbar disc bulges and neuroforaminal narrowing irritating nerve roots.  He didn't have any other features to suspect TIA."  Had mri at [facility] last in 2017. Injections in the past have been helpful for up to 3 months- this was ordered through neurology.  Results  "1. Multilevel lumbar spondylosis, most advanced at L5-S1 where there is mild canal stenosis, bilateral lateral recess stenosis and moderate left greater than right foraminal stenosis secondary to a left eccentric disc bulge. 2. Bilateral lateral recess and mild left greater than right foraminal stenosis at L4-L5 secondary to a left eccentric disc bulge. 3. Bilateral renal lesions, better evaluated by prior MR and CT abdomen." A/P: Chronic low back pain/also with some mobility issues potentially related to mild lumbar spine stenosis.  Pain is reasonably controlled with gabapentin through neurology- patient would like to consider updating imaging and working on improving his mobility- wonders if another injection would help-he has seen [facility] in the past-he would like a referral back to them.  We also considered physical therapy but he wants to start with orthopedics-may do therapy through  them. -Last A1c was much improved so could likely tolerate a steroid injection from blood sugar perspective

## 2018-09-21 NOTE — Progress Notes (Signed)
Phone [phone]   Subjective:  Virtual visit via Video note. Chief complaint: Chief Complaint  Patient presents with  . Back Pain    Pt stated he has a hx of back pain sx has worsen. Pt stated that it causing his gate to be a little off. Pt stated he hurts more after sitting at his desk and claims the " area feel so weak"   This visit type was conducted due to national recommendations for restrictions regarding the COVID-19 Pandemic (e.g. social distancing).  This format is felt to be most appropriate for this patient at this time balancing risks to patient and risks to population by having him in for in person visit.  No physical exam was performed (except for noted visual exam or audio findings with Telehealth visits).    Our team/I connected with [name] on 09/21/18 at 10:40 AM EDT by a video enabled telemedicine application ([email or web address]) and verified that I am speaking with the correct person using two identifiers.  Location patient: [place] Location provider: [facility], office Persons participating in the virtual visit:  patient  Our team/I discussed the limitations of evaluation and management by telemedicine and the availability of in person appointments. In light of current covid-19 pandemic, patient also understands that we are trying to protect them by minimizing in office contact if at all possible.  The patient expressed consent for telemedicine visit and agreed to proceed. Patient understands insurance will be billed.   ROS-complains of back pain and feeling of weakness in his legs.  No fever or chills reported.  Past Medical History-  Patient Active Problem List   Diagnosis Date Noted  . Morbid obesity ([place]) 12/04/2017    Priority: High  . 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 Renal transplant recipient 06/27/2015    Priority: High  . 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 Diabetic neuropathy ([place]) 10/14/2010    Priority: High  . CAD -s/p DES to marginal vessel at [facility] 04/09/2009    Priority: High  . Diabetes mellitus due to underlying condition with diabetic autonomic (poly)neuropathy ([place]) 09/05/2007    Priority: High  . Hyperlipidemia 01/16/2016    Priority: Medium  . Gout 12/22/2009    Priority: Medium  . Obstructive sleep apnea 07/10/2009    Priority: Medium  . BPH (benign prostatic hyperplasia) 09/25/2008    Priority: Medium  . Low back pain 12/15/2007    Priority: Medium  . Posttraumatic stress disorder 11/03/2007    Priority: Medium  . Essential hypertension 01/17/2007    Priority: Medium  . CVA (cerebral infarction) 01/17/2007    Priority: Medium  . GERD (gastroesophageal reflux disease) 05/16/2014    Priority: Low  . Trigger thumb of right hand 01/03/2014    Priority: Low  . Left foot drop 05/02/2013    Priority: Low  . Risk for falls 01/22/2012    Priority: Low  . Gastroparesis 06/19/2008    Priority: Low  . Allergic rhinitis 03/01/2007    Priority: Low  . Coagulation defect ([place]) 09/21/2018  . Spinal stenosis of lumbar region 09/21/2018  . Orthostatic hypotension 01/15/2015  . Fall at home 01/15/2015    Medications- reviewed and updated Current Outpatient Medications  Medication Sig Dispense Refill  . acetaminophen (TYLENOL) 500 MG tablet Take 1,000 mg by mouth as needed for mild pain or headache.     . allopurinol (ZYLOPRIM) 100  MG tablet Take 100 mg by mouth 2 (two) times daily.     [name] amLODipine (NORVASC) 10 MG tablet Take 10 mg by mouth daily.    [name] aspirin 81 MG tablet Take 81 mg by mouth at bedtime.     [name] atorvastatin (LIPITOR) 40 MG tablet Take 40 mg by mouth daily.    . clopidogrel (PLAVIX) 75 MG tablet Take 75 mg by mouth daily.     . Continuous Blood Gluc Receiver (FREESTYLE LIBRE 14 DAY READER) DEVI 1 Device by Does not apply route daily. 1 Device 0  . Continuous Blood Gluc Sensor  (FREESTYLE LIBRE 14 DAY SENSOR) MISC Place 1 Units onto the skin every 14 (fourteen) days. 3 each 11  . diazepam (VALIUM) 5 MG tablet Take 1 tablet (5 mg total) by mouth at bedtime. 30 tablet 0  . furosemide (LASIX) 40 MG tablet Take 1 tablet (40 mg total) by mouth daily. (Patient taking differently: Take 20 mg by mouth daily. ) 30 tablet   . gabapentin (NEURONTIN) 300 MG capsule Take 2 capsules in AM, 1 capsule at noon and 3 capsules at bedtime (Patient taking differently: Take 3 capsules in AM, 3 capsule at noon and 3 capsules at bedtime) 180 capsule 5  . glucose blood (ACCU-CHEK AVIVA PLUS) test strip USE AS DIRECTED CHECK BLOOD SUGAR TWICE A DAY 200 each 3  . insulin glargine (LANTUS) 100 UNIT/ML injection Inject 75 Units into the skin 2 (two) times daily.    . Insulin Syringe-Needle U-100 (B-D INS SYR ULTRAFINE 1CC/31G) 31G X 5/16" 1 ML MISC Used to give insulin injections twice daily. 100 each 11  . Magnesium 200 MG TABS Take 200 mg by mouth daily with lunch.     . mycophenolate (MYFORTIC) 180 MG EC tablet Take 180 mg by mouth 2 (two) times daily.    . nitroGLYCERIN (NITROSTAT) 0.4 MG SL tablet PLACE 1 TABLET (0.4 MG TOTAL) UNDER THE TONGUE EVERY 5 (FIVE) MINUTES AS NEEDED FOR CHEST PAIN. 25 tablet 2  . omeprazole (PRILOSEC) 20 MG capsule Take 1 capsule (20 mg total) by mouth 2 (two) times daily before a meal. 180 capsule 3  . tacrolimus (PROGRAF) 1 MG capsule 5 capsules 2 times daily    . Tacrolimus 1 MG CP24 Take 4 mg by mouth 2 (two) times daily.     . cyclobenzaprine (FLEXERIL) 5 MG tablet Take 0.5-1 tablets (2.5-5 mg total) 3 (three) times daily as needed by mouth for muscle spasms. (Patient not taking: Reported on 09/21/2018) 30 tablet 0  . insulin lispro (HUMALOG) 100 UNIT/ML injection Inject into the skin 3 (three) times daily before meals.    . metFORMIN (GLUCOPHAGE) 1000 MG tablet Take 1,000 mg by mouth 2 (two) times daily with a meal.    . traMADol (ULTRAM) 50 MG tablet Take 1 tablet  (50 mg total) by mouth every 8 (eight) hours as needed for moderate pain or severe pain. (Patient not taking: Reported on 09/21/2018) 60 tablet 1   No current facility-administered medications for this visit.      Objective:  BP 120/82   Temp 97.8 F (36.6 C) (Oral)   Ht 5\' 10"  (1.778 m)   Wt 247 lb (112 kg)   BMI 35.44 kg/m  Gen: NAD, resting comfortably Lungs: nonlabored, normal respiratory rate  Skin: appears dry, no obvious rash    Assessment and Plan   #  Chronic back pain/lumbar stenosis- though mild S:Patient states he had a flare up  in his back pain- seems to cause issues with his walking and difficulty being mobile. Lower back and his left leg related to neuropathy is really causing him issues. [place] neurologist increased gabapentin to 900mg  3x a day - seems to have helped with the pain.   He is trying not to walk with a cane or walker but keeps cane in the car. Does use walker in the house at times- feels weak in the legs and has to turn around and sit on the walker.  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 Tomi Likens "1.  Diabetic polyneuropathy 2.  Lumbosacral radiculopathy.  I suspect the right leg pain and numbness was likely due to a radiculopathy.  He has known lumbar disc bulges and neuroforaminal narrowing irritating nerve roots.  He didn't have any other features to suspect TIA."  Had mri at [facility] last in 2017. Injections in the past have been helpful for up to 3 months- this was ordered through neurology.  Results  "1. Multilevel lumbar spondylosis, most advanced at L5-S1 where there is mild canal stenosis, bilateral lateral recess stenosis and moderate left greater than right foraminal stenosis secondary to a left eccentric disc bulge. 2. Bilateral lateral recess and mild left greater than right foraminal stenosis at L4-L5 secondary to a left eccentric disc bulge. 3. Bilateral renal  lesions, better evaluated by prior MR and CT abdomen." A/P: Chronic low back pain/also with some mobility issues potentially related to mild lumbar spine stenosis.  Pain is reasonably controlled with gabapentin through neurology- patient would like to consider updating imaging and working on improving his mobility- wonders if another injection would help-he has seen [facility] in the past-he would like a referral back to them.  We also considered physical therapy but he wants to start with orthopedics-may do therapy through them. -Last A1c was much improved so could likely tolerate a steroid injection from blood sugar perspective  # Diabetes S:  down almost 20 lbs on this. Also on lantus 75 untis twice a day. He is also on metformin he reports ([facility] been [facility] despite [facility] history). Too much diarrhea on metformin- being switched ozempic A/P: [facility] at [facility]-congratulated patient on weight loss and improvement in A1c-he is going to upload his A1c to my chart  # GERD S:compliant with omeprazole 20mg  twice a day- gets stomach irritation if he doesn't take twice a day  A/P: We discussed high-dose PPIs are not ideal for reflux in patients with kidney disease- we discussed trying once daily to see if he can tolerate this-he has lost some weight so this may help - may reduce plavix efficacy- so once again will try daily dose  #Morbid obesity- S: Patient has tightened up diet- exercise still limited due to weakness/pain.  Still morbid obesity with hypertension, hyperlipidemia, diabetes and BMI over 35 A/P: Congratulated patient on efforts- Encouraged need for healthy eating, regular exercise, weight loss.     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 Need to target BP <130/80- he is very close to that target today  Diabetes mellitus  due to underlying condition with diabetic autonomic (poly)neuropathy [facility]) S:Patient states he had a flare up in his back pain- seems to cause issues with his walking and  difficulty being mobile. Lower back and his left leg related to neuropathy is really causing him issues. [place] neurologist increased gabapentin to 900mg  3x a day - seems to have helped with the pain.   He is trying not to walk with a cane or walker but keeps cane in the car. Does use walker in the house at times- feels weak in the legs and has to turn around and sit on the walker.  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 Tomi Likens "1.  Diabetic polyneuropathy 2.  Lumbosacral radiculopathy.  I suspect the right leg pain and numbness was likely due to a radiculopathy.  He has known lumbar disc bulges and neuroforaminal narrowing irritating nerve roots.  He didn't have any other features to suspect TIA."  Had mri at [facility] last in 2017. Injections in the past have been helpful for up to 3 months- this was ordered through neurology.  Results  "1. Multilevel lumbar spondylosis, most advanced at L5-S1 where there is mild canal stenosis, bilateral lateral recess stenosis and moderate left greater than right foraminal stenosis secondary to a left eccentric disc bulge. 2. Bilateral lateral recess and mild left greater than right foraminal stenosis at L4-L5 secondary to a left eccentric disc bulge. 3. Bilateral renal lesions, better evaluated by prior MR and CT abdomen." A/P: Chronic low back pain/also with some mobility issues potentially related to mild lumbar spine stenosis.  Pain is reasonably controlled with gabapentin through neurology- patient would like to consider updating imaging and working on improving his mobility- wonders if another injection would help-he has seen [facility] in the past-he would like a referral back to them.  We also considered  physical therapy but he wants to start with orthopedics-may do therapy through them. -Last A1c was much improved so could likely tolerate a steroid injection from blood sugar perspective  Diabetic neuropathy ([place]) I strongly wonder whether neuropathy contributes to patient's sensation of weakness- would consider this more strongly if no improvement if injections are completed.   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 omeprazole (PRILOSEC) 20 MG capsule    Sig: Take 1 capsule (20 mg total) by mouth 2 (two) times daily before a meal.    Dispense:  180 capsule    Refill:  3    Return precautions advised.  Garret Reddish, MD

## 2018-09-21 NOTE — Assessment & Plan Note (Signed)
Appears to be followed by [facility]- last CT was January 2019 and stated stable aneurysm at 4.7 cm- stable from 2016. Need to target BP <130/80- he is very close to that target today

## 2018-09-22 ENCOUNTER — Telehealth: Payer: Self-pay

## 2018-09-22 ENCOUNTER — Ambulatory Visit: Payer: Self-pay | Admitting: *Deleted

## 2018-09-22 NOTE — Telephone Encounter (Signed)
Left message to return phone call.

## 2018-09-22 NOTE — Telephone Encounter (Signed)
Patient was just seen yesterday, please advise

## 2018-09-22 NOTE — Telephone Encounter (Signed)
He can trial his tylenol. Could also add tylenol arthritis up to every 8 hours. Also - make sure ortho referral is being processed for him.

## 2018-09-22 NOTE — Telephone Encounter (Signed)
Please advise 

## 2018-09-22 NOTE — Telephone Encounter (Signed)
Patient notified of results and verbalized understanding.  

## 2018-09-22 NOTE — Telephone Encounter (Signed)
Pt called office concerning not being able to see his office note from 08/18/18 ov on My Chart. He and his wife were on speaker phone, we went over the increase in gabapentin.

## 2018-09-22 NOTE — Telephone Encounter (Signed)
Patient is calling to report left foot numbness and pain in left hip and back. Pain is so bad patient reports he is unable to stand on his on. Patient reports he has had this pain since 1983- it is getting worse.Patient is not alone and he does have help- he states he is not going to the hospital- he may have over worked yesterday. Patient did get call for ortho referral- he will follow up with that- patient wants to know if there is anything that can be done for his pain. Call to office- note requested for PCP review and response.  Reason for Disposition . [1] SEVERE back pain (e.g., excruciating, unable to do any normal activities) AND [2] not improved 2 hours after pain medicine    Patient calling with increased pain and foot numbness  Answer Assessment - Initial Assessment Questions 1. LOCATION and RADIATION: "Where is the pain located?"      Pain is radiating down to foot today- worse than yesterday- foot is numb today 2. QUALITY: "What does the pain feel like?"  (e.g., sharp, dull, aching, burning)     Increase in pain- sharp pain - like a shock- weakness in leg is noticable 3. SEVERITY: "How bad is the pain?" "What does it keep you from doing?"   (Scale 1-10; or mild, moderate, severe)   -  MILD (1-3): doesn't interfere with normal activities    -  MODERATE (4-7): interferes with normal activities (e.g., work or school) or awakens from sleep, limping    -  SEVERE (8-10): excruciating pain, unable to do any normal activities, unable to walk     severe 4. ONSET: "When did the pain start?" "Does it come and go, or is it there all the time?"     Pain has been present for some time- but patient reports increase last night after he had worked. Patient was up during the night to the bathroom and had a hard time getting back to bed- he did not take his walker. 5. 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 CAUSE: "What do you think is causing the hip pain?"      Patient has had prolonged pain- last night he started having pain- he had to sit in bathroom for 1 1/2 hour before he could get to bed. Patient has referral to ortho. 7. AGGRAVATING FACTORS: "What makes the hip pain worse?" (e.g., walking, climbing stairs, running)     Putting weight on leg makes pain worse- numbness in foot is new 8. OTHER SYMPTOMS: "Do you have any other symptoms?" (e.g., back pain, pain shooting down leg,  fever, rash)     Numbness in foot, back and hip pain  Protocols used: BACK PAIN-A-AH, HIP PAIN-A-AH

## 2018-09-30 ENCOUNTER — Encounter: Payer: Self-pay | Admitting: Orthopaedic Surgery

## 2018-09-30 ENCOUNTER — Ambulatory Visit: Payer: Self-pay

## 2018-09-30 ENCOUNTER — Other Ambulatory Visit: Payer: Self-pay

## 2018-09-30 ENCOUNTER — Ambulatory Visit (INDEPENDENT_AMBULATORY_CARE_PROVIDER_SITE_OTHER): Payer: Medicare Other | Admitting: Orthopaedic Surgery

## 2018-09-30 VITALS — Ht 70.5 in | Wt 244.0 lb

## 2018-09-30 DIAGNOSIS — G8929 Other chronic pain: Secondary | ICD-10-CM

## 2018-09-30 DIAGNOSIS — M4722 Other spondylosis with radiculopathy, cervical region: Secondary | ICD-10-CM

## 2018-09-30 DIAGNOSIS — M545 Low back pain, unspecified: Secondary | ICD-10-CM

## 2018-09-30 DIAGNOSIS — M542 Cervicalgia: Secondary | ICD-10-CM | POA: Diagnosis not present

## 2018-09-30 DIAGNOSIS — M4807 Spinal stenosis, lumbosacral region: Secondary | ICD-10-CM

## 2018-09-30 DIAGNOSIS — M5030 Other cervical disc degeneration, unspecified cervical region: Secondary | ICD-10-CM | POA: Diagnosis not present

## 2018-09-30 DIAGNOSIS — M4726 Other spondylosis with radiculopathy, lumbar region: Secondary | ICD-10-CM

## 2018-09-30 NOTE — Progress Notes (Signed)
Office Visit Note   Patient: [name]           Date of Birth: [birth year]           MRN: [id number] Visit Date: 09/30/2018              Requested by: [name], MD [place], [place] [id number] PCP: [name], MD   Assessment & Plan: Visit Diagnoses:  1. Neck pain   2. Chronic left-sided low back pain, unspecified whether sciatica present   3. Other spondylosis with radiculopathy, cervical region   4. Other spondylosis with radiculopathy, lumbar region   5. Other cervical disc degeneration, unspecified cervical region   6. Spinal stenosis of lumbosacral region     Plan: With patient's worsening symptoms with his neck and lumbar spine recommend getting stat cervical and lumbar spine MRI scans to rule out radiculopathy versus myelopathy.  I am very concerned about the unsteady gait and multiple falls that he describes.  Most recent lumbar MRI 2017 at [facility].  Follow-up with Dr. Lorin Mercy after completion of his studies to discuss results and further treatment options.  All questions answered.  I did encourage patient to use his walker when he is up and ambulating.  Follow-Up Instructions: Return in about 2 weeks (around 10/14/2018) for dr yates to review mri scans.   Orders:  Orders Placed This Encounter  Procedures  . XR Cervical Spine 2 or 3 views  . XR Lumbar Spine 2-3 Views  . MR Cervical Spine w/o contrast  . MR Lumbar Spine w/o contrast   No orders of the defined types were placed in this encounter.     Procedures: No procedures performed   Clinical Data: No additional findings.   Subjective: Chief Complaint  Patient presents with  . Neck - Pain  . Lower Back - Pain    HPI 68 year old neck pain, low back pain, upper/lower extremity radiculopathy/weakness and unsteady gait.  Patient states that he has had issues for several years but this is been progressively worsening over the last year or so.  Has been seen at  [facility] and had lumbar MRI December 11, 2015.  I do not have the actual images to review but copy of that report showed L2-3 mild left eccentric broad-based disc bulge without significant canal or foraminal stenosis.  There is right greater than left facet hypertrophy and ligamentum flavum thickening.  L3-4 right greater than left facet hypertrophy and ligamentum flavum thickening without significant canal or foraminal stenosis.  L4-5 left eccentric disc bulge and combination with facet hypertrophy laterally and ligament flavum thickening resulting in mild canal stenosis and mild left greater than right foraminal stenosis.  Bilateral lateral recess stenosis.  Right greater than left facet joint.  L5-S1 disc height loss and endplate degenerative changes.  A left eccentric broad-based disc bulge in combination with bilateral facet hypertrophy and ligamentum flavum thickening results in mild canal stenosis, bilateral lateral recess stenosis, moderate left greater than right foraminal stenosis.  He did go on to have lumbar ESI's without much improvement.  Neck pain more at night.  States that he has to use a contour pillow when he is sleeping without any relief.  Left-sided neck pain radiates between the shoulder blades and also down the left arm..  Also complains of right arm weakness and feels like his left hand is "clumsy".  Low back pain also described as being constant along with left leg pain  that radiates to his ankle.  Left foot is constantly numb.  Feels like he has left greater than right leg weakness.  has also been having an unsteady gait for at least a year and he has had multiple falls.  Patient has had a kidney transplant and is unable to use NSAIDs.  He does take gabapentin prescribed by his PCP. Review of Systems No current cardiac pulmonary GI GU issues  Objective: Vital Signs: Ht 5' 10.5" (1.791 m)   Wt 244 lb (110.7 kg)   BMI 34.52 kg/m   Physical Exam HENT:     Head: Normocephalic and  atraumatic.  Neck:     Comments: Cervical spine decreased range of motion secondary to pain and stiffness.  Marked left greater than right brachial plexus tenderness.  Marked left trapezius tenderness.  Marked right scapular tenderness. Pulmonary:     Effort: No respiratory distress.  Musculoskeletal:     Comments: Both shoulders good range of motion.  Negative impingement test.  Marked left lumbar paraspinal tenderness.  Mild to moderate on the right side.  Moderate tenderness over the left hip greater trochanter bursa.  No groin pain with bilateral hip internal/external rotation.  Left hip rotation does cause pain over the left lateral hip.  Positive left straight leg raise.  Negative on the right side.  Patient does have left biceps and triceps weakness.  Positive left grip weakness.  Right upper extremity feels reasonably strong.  Positive left quad, gastroc and anterior tib weakness.  Right lower extremity feels reasonably strong.  Bilateral calves nontender.  Skin:    General: Skin is warm and dry.  Neurological:     Mental Status: He is alert and oriented to person, place, and time.     Ortho Exam  Specialty Comments:  No specialty comments available.  Imaging: No results found.   PMFS History: Patient Active Problem List   Diagnosis Date Noted  . Spinal stenosis of lumbar region 09/21/2018  . Morbid obesity ([place]) 12/04/2017  . Hyperlipidemia 01/16/2016  . Immunosuppressive management encounter following kidney transplant 07/12/2015  . Renal transplant recipient 06/27/2015  . Orthostatic hypotension 01/15/2015  . Fall at home 01/15/2015  . Ascending aorta dilatation-4.7 cm per ECHO [facility] 2014 10/23/2014  . GERD (gastroesophageal reflux disease) 05/16/2014  . Trigger thumb of right hand 01/03/2014  . Left foot drop 05/02/2013  . Risk for falls 01/22/2012  . Diabetic neuropathy ([place]) 10/14/2010  . Gout 12/22/2009  . Obstructive sleep apnea 07/10/2009  . CAD -s/p DES to  marginal vessel at [facility] 2014 04/09/2009  . BPH (benign prostatic hyperplasia) 09/25/2008  . Gastroparesis 06/19/2008  . Low back pain 12/15/2007  . Posttraumatic stress disorder 11/03/2007  . 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 Allergic rhinitis 03/01/2007  . Essential hypertension 01/17/2007  . CVA (cerebral infarction) 01/17/2007   Past Medical History:  Diagnosis Date  . Allergy   . Angina   . Arthritis   . Backache, unspecified   . CHF (congestive heart failure) ([place])   . Chills   . Chronic kidney disease, stage III (moderate) (HCC)    HD T- TH-SAT  . Complication of anesthesia    01/2011 could not eat,, hospt x2, was placed on hd and cleared up  . Coronary atherosclerosis of native coronary artery   . Diabetes mellitus 2004  . Dizziness   . Dysphagia, unspecified(787.20)   . Gastroparesis   . Headache(784.0)   .  Hemorrhage of rectum and anus   . Hyperlipidemia   . Hypertrophy of prostate with urinary obstruction and other lower urinary tract symptoms (LUTS)   . INTERNAL HEMORRHOIDS 10/16/2008   Qualifier: Diagnosis of  By: Nolon Rod CMA (AAMA), Robin    . Internal hemorrhoids without mention of complication   . Myocardial infarction ([place]) 2002  . Nausea alone   . Obesity   . Other dyspnea and respiratory abnormality   . Other malaise and fatigue   . Personal history of unspecified circulatory disease   . Posttraumatic stress disorder   . Sleep apnea    USES CPAP   . Stroke [facility])    2007  . Unspecified essential hypertension    hx htn     Family History  Problem Relation Age of Onset  . Heart disease Father   . Renal Disease Father   . Dementia Mother   . Heart attack Mother   . Diabetes Sister   . Heart disease Sister   . Diabetes Maternal Aunt   . Diabetes Maternal Uncle   . Diabetes Paternal Aunt   . Diabetes Paternal Uncle   . Heart disease Unknown   . Heart attack Brother   .  Lung cancer Sister   . Renal Disease Brother     Past Surgical History:  Procedure Laterality Date  . ANAL FISSURECTOMY    . AV FISTULA PLACEMENT    . CARDIAC CATHETERIZATION     2005 DR BRODIE  . HEMORRHOID SURGERY    . revision of fistula     renal failure  . VENTRAL HERNIA REPAIR  07/01/2011   Procedure: HERNIA REPAIR VENTRAL ADULT;  Surgeon: Joyice Faster. Cornett, MD;  Location: [facility] OR;  Service: General;  Laterality: N/A;   Social History   Occupational History  . Occupation: disabled    Employer: DISABLED  Tobacco Use  . Smoking status: Never Smoker  . Smokeless tobacco: Never Used  Substance and Sexual Activity  . Alcohol use: No  . Drug use: No  . Sexual activity: Not Currently

## 2018-10-05 ENCOUNTER — Encounter: Payer: Medicare Other | Admitting: Family Medicine

## 2018-10-07 ENCOUNTER — Ambulatory Visit (HOSPITAL_COMMUNITY)
Admission: RE | Admit: 2018-10-07 | Discharge: 2018-10-07 | Disposition: A | Discharge status: Home / Self Care | Payer: Medicare Other | Source: Ambulatory Visit | Attending: Surgery | Admitting: Surgery

## 2018-10-07 ENCOUNTER — Other Ambulatory Visit: Payer: Self-pay

## 2018-10-07 ENCOUNTER — Encounter (HOSPITAL_COMMUNITY): Payer: Self-pay

## 2018-10-07 DIAGNOSIS — M4807 Spinal stenosis, lumbosacral region: Secondary | ICD-10-CM

## 2018-10-07 DIAGNOSIS — M5030 Other cervical disc degeneration, unspecified cervical region: Secondary | ICD-10-CM

## 2018-10-07 DIAGNOSIS — M5116 Intervertebral disc disorders with radiculopathy, lumbar region: Secondary | ICD-10-CM | POA: Diagnosis not present

## 2018-10-07 DIAGNOSIS — M50122 Cervical disc disorder at C5-C6 level with radiculopathy: Secondary | ICD-10-CM | POA: Diagnosis not present

## 2018-10-11 ENCOUNTER — Telehealth: Payer: Self-pay | Admitting: Orthopaedic Surgery

## 2018-10-11 NOTE — Telephone Encounter (Signed)
LMOM that we scheduled appt w/Yates for MRI review. Adv pt of not a good time or date to call us back to r/s.

## 2018-10-14 ENCOUNTER — Encounter: Payer: Self-pay | Admitting: Orthopaedic Surgery

## 2018-10-14 ENCOUNTER — Other Ambulatory Visit: Payer: Self-pay

## 2018-10-14 ENCOUNTER — Ambulatory Visit (INDEPENDENT_AMBULATORY_CARE_PROVIDER_SITE_OTHER): Payer: Medicare Other | Admitting: Orthopaedic Surgery

## 2018-10-14 VITALS — Ht 70.5 in | Wt 244.0 lb

## 2018-10-14 DIAGNOSIS — G8929 Other chronic pain: Secondary | ICD-10-CM | POA: Diagnosis not present

## 2018-10-14 DIAGNOSIS — M545 Low back pain, unspecified: Secondary | ICD-10-CM

## 2018-10-14 NOTE — Progress Notes (Signed)
Office Visit Note   Patient: [name]           Date of Birth: 1951/02/08           MRN: [id number] Visit Date: 10/14/2018              Requested by: [name], MD [place], [place] [address] PCP: [name], MD   Assessment & Plan: Visit Diagnoses:  1. Chronic low back pain without sciatica, unspecified back pain laterality   2. Neck pain  Plan: We will set patient up for some physical therapy for evaluation and treatment.  We reviewed the MRI scans I gave him a copy of the report.  He has some degeneration at C5-6 without significant compression.  He can return if he has ongoing symptoms.  Follow-Up Instructions: Return if symptoms worsen or fail to improve.   Orders:  No orders of the defined types were placed in this encounter.  No orders of the defined types were placed in this encounter.     Procedures: No procedures performed   Clinical Data: No additional findings.   Subjective: Chief Complaint  Patient presents with  . Neck - Pain  . Lower Back - Pain    HPI 68 year old male states he just rearranged his bedroom he went to get up to go to the bathroom rolled off the bed hit his head wrenched his neck with increased neck pain pain that radiates into the shoulders.  He is used Tylenol extra strength with some relief.  His neck is stiff he has some associated low back pain as well.  MRI scan lumbar and cervical were obtained on 10/07/2018 and are available for review.  Mild disc degeneration C5-6 with mild foraminal narrowing without central stenosis.  Other levels show minimal changes.  Lumbar MRI scan showed unchanged 2 cm right renal mass possible complex cyst unchanged from previous imaging with slight progression of lumbar disc degeneration with mild neuroforaminal narrowing.  Review of Systems past history of BPH, type 2 diabetes with automatic polyneuropathy.  4.7 cm a sending aortic dilatation echo 2014, coronary artery  disease, hypertension, sleep apnea, PTSD.  Past history of a left foot drop.  Renal transplant.  Shunt upper left arm.   Objective: Vital Signs: Ht 5' 10.5" (1.791 m)   Wt 244 lb (110.7 kg)   BMI 34.52 kg/m   Physical Exam Constitutional:      Appearance: He is well-developed.  HENT:     Head: Normocephalic and atraumatic.  Eyes:     Pupils: Pupils are equal, round, and reactive to light.  Neck:     Thyroid: No thyromegaly.     Trachea: No tracheal deviation.  Cardiovascular:     Rate and Rhythm: Normal rate.  Pulmonary:     Effort: Pulmonary effort is normal.     Breath sounds: No wheezing.  Abdominal:     General: Bowel sounds are normal.     Palpations: Abdomen is soft.  Skin:    General: Skin is warm and dry.     Capillary Refill: Capillary refill takes less than 2 seconds.  Neurological:     Mental Status: He is alert and oriented to person, place, and time.  Psychiatric:        Behavior: Behavior normal.        Thought Content: Thought content normal.        Judgment: Judgment normal.     Ortho Exam renal  transplant scar.  Radial pulse left wrist is normal.  Minimal brachial plexus tenderness negative Spurling.  Negative impingement of the shoulders ulnar nerve at the elbow median nerve at the wrist is normal.  Upper extremity reflexes are 2+ and symmetrical.  Specialty Comments:  No specialty comments available.  Imaging: CLINICAL DATA:  Worsening low back pain and lower extremity radiculopathy/weakness with unsteady gait. Left leg pain and numbness.  EXAM: MRI LUMBAR SPINE WITHOUT CONTRAST  TECHNIQUE: Multiplanar, multisequence MR imaging of the lumbar spine was performed. No intravenous contrast was administered.  COMPARISON:  06/16/2013  FINDINGS: Segmentation:  Standard.  Alignment:  Unchanged grade 1 retrolisthesis of L5 on S1.  Vertebrae: No fracture or suspicious osseous lesion. Low level bilateral facet edema at L4-5. Unchanged L3  superior endplate Schmorl's node and chronic degenerative endplate changes at Z6-X0. Subcentimeter hemangioma in the L4 vertebral body.  Conus medullaris and cauda equina: Conus extends to the L1 level. Conus and cauda equina appear normal.  Paraspinal and other soft tissues: Progressive bilateral renal atrophy with numerous small cyst noted bilaterally. Unchanged size of complex 2 cm lesion in the interpolar right kidney which is predominantly T2 hyperintense with intermediate to mildly increased T1 signal intensity, possibly a proteinaceous/hemorrhagic cyst with septation medially. Small volume layering blood products in a 1.3 cm right lower pole cyst. Partially visualized new right lower quadrant renal transplant with a few small cysts, incompletely evaluated.  Disc levels:  Disc space narrowing is mild at L2-3 and moderate at L5-S1 with associated disc desiccation.  T12-L1: Negative.  L1-2: Normal disc. Mild facet hypertrophy without stenosis, unchanged.  L2-3: Mildly increased disc bulging asymmetric to the left and mild-to-moderate facet and ligamentum flavum hypertrophy result in new mild left neural foraminal stenosis without spinal stenosis.  L3-4: Slightly increased, mild disc bulging and mild-to-moderate facet and ligamentum flavum hypertrophy result in borderline left neural foraminal stenosis without spinal stenosis.  L4-5: Slightly increased disc bulging greater to the left and moderate facet and ligamentum flavum hypertrophy result in mild left greater than right neural foraminal stenosis without significant spinal stenosis.  L5-S1: Conjoined right L5 and S1 nerve roots as previously seen. Disc bulging, endplate spurring, disc space height loss, and mild facet and ligamentum flavum hypertrophy result in mild bilateral neural foraminal stenosis without significant spinal stenosis, unchanged.  IMPRESSION: 1. Slight progression of lumbar disc  degeneration with mild multilevel neural foraminal stenosis as above. 2. No significant spinal stenosis. 3. Unchanged 2 cm right renal mass, possibly a complex cyst although incompletely evaluated on this study and with neoplasm not excluded.   Electronically Signed   By: Logan Bores M.D.   On: 10/07/2018 09:18  CLINICAL DATA:  Worsening neck pain, left upper extremity radiculopathy/weakness, and unsteady gait with multiple falls.  EXAM: MRI CERVICAL SPINE WITHOUT CONTRAST  TECHNIQUE: Multiplanar, multisequence MR imaging of the cervical spine was performed. No intravenous contrast was administered.  COMPARISON:  Cervical spine radiographs 09/30/2018 and CT 03/04/2012  FINDINGS: Alignment: Trace retrolisthesis of C5 on C6, unchanged from the prior CT.  Vertebrae: No fracture, suspicious osseous lesion, or significant marrow edema. Type 2 degenerative endplate changes at R6-0 associated with moderate disc space narrowing.  Cord: Normal signal and morphology.  Posterior Fossa, vertebral arteries, paraspinal tissues: Unremarkable.  Disc levels:  C2-3: Negative.  C3-4: Minimal disc bulging without stenosis.  C4-5: Minimal disc bulging, uncovertebral spurring, and mild facet arthrosis without evidence of significant stenosis.  C5-6: Broad-based posterior disc osteophyte complex results  in mild bilateral neural foraminal stenosis without significant spinal stenosis.  C6-7: At most minimal disc bulging without stenosis.  C7-T1: Negative.  IMPRESSION: 1. Cervical disc degeneration predominantly at C5-6 where there is mild bilateral neural foraminal stenosis. No spinal stenosis. 2. Normal appearance of the cervical spinal cord.   Electronically Signed   By: Logan Bores M.D.   On: 10/07/2018 09:03  PMFS History: Patient Active Problem List   Diagnosis Date Noted  . Spinal stenosis of lumbar region 09/21/2018  . Morbid obesity ([place])  12/04/2017  . Hyperlipidemia 01/16/2016  . Immunosuppressive management encounter following kidney transplant 07/12/2015  . Renal transplant recipient 06/27/2015  . Orthostatic hypotension 01/15/2015  . Fall at home 01/15/2015  . Ascending aorta dilatation-4.7 cm per ECHO [facility] 2014 10/23/2014  . GERD (gastroesophageal reflux disease) 05/16/2014  . Trigger thumb of right hand 01/03/2014  . Left foot drop 05/02/2013  . Risk for falls 01/22/2012  . Diabetic neuropathy ([place]) 10/14/2010  . Gout 12/22/2009  . Obstructive sleep apnea 07/10/2009  . CAD -s/p DES to marginal vessel at [facility] 2014 04/09/2009  . BPH (benign prostatic hyperplasia) 09/25/2008  . Gastroparesis 06/19/2008  . Low back pain 12/15/2007  . Posttraumatic stress disorder 11/03/2007  . 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 Allergic rhinitis 03/01/2007  . Essential hypertension 01/17/2007  . CVA (cerebral infarction) 01/17/2007   Past Medical History:  Diagnosis Date  . Allergy   . Angina   . Arthritis   . Backache, unspecified   . CHF (congestive heart failure) ([place])   . Chills   . Chronic kidney disease, stage III (moderate) (HCC)    HD T- TH-SAT  . Complication of anesthesia    01/2011 could not eat,, hospt x2, was placed on hd and cleared up  . Coronary atherosclerosis of native coronary artery   . Diabetes mellitus 2004  . Dizziness   . Dysphagia, unspecified(787.20)   . Gastroparesis   . Headache(784.0)   . Hemorrhage of rectum and anus   . Hyperlipidemia   . Hypertrophy of prostate with urinary obstruction and other lower urinary tract symptoms (LUTS)   . INTERNAL HEMORRHOIDS 10/16/2008   Qualifier: Diagnosis of  By: Nolon Rod CMA (AAMA), Robin    . Internal hemorrhoids without mention of complication   . Myocardial infarction ([place]) 2002  . Nausea alone   . Obesity   . Other dyspnea and respiratory abnormality   . Other malaise  and fatigue   . Personal history of unspecified circulatory disease   . Posttraumatic stress disorder   . Sleep apnea    USES CPAP   . Stroke [facility])    2007  . Unspecified essential hypertension    hx htn     Family History  Problem Relation Age of Onset  . Heart disease Father   . Renal Disease Father   . Dementia Mother   . Heart attack Mother   . Diabetes Sister   . Heart disease Sister   . Diabetes Maternal Aunt   . Diabetes Maternal Uncle   . Diabetes Paternal Aunt   . Diabetes Paternal Uncle   . Heart disease Unknown   . Heart attack Brother   . Lung cancer Sister   . Renal Disease Brother     Past Surgical History:  Procedure Laterality Date  . ANAL FISSURECTOMY    . AV FISTULA PLACEMENT    . CARDIAC CATHETERIZATION  2005 DR BRODIE  . HEMORRHOID SURGERY    . revision of fistula     renal failure  . VENTRAL HERNIA REPAIR  07/01/2011   Procedure: HERNIA REPAIR VENTRAL ADULT;  Surgeon: Joyice Faster. Cornett, MD;  Location: [facility] OR;  Service: General;  Laterality: N/A;   Social History   Occupational History  . Occupation: disabled    Employer: DISABLED  Tobacco Use  . Smoking status: Never Smoker  . Smokeless tobacco: Never Used  Substance and Sexual Activity  . Alcohol use: No  . Drug use: No  . Sexual activity: Not Currently

## 2018-10-18 ENCOUNTER — Telehealth: Payer: Self-pay | Admitting: Physical Therapy

## 2018-10-18 NOTE — Telephone Encounter (Signed)
Spoke with patient, he is comfortable coming into clinic for treatment. [place] the other day and reports hitting his head on the floor and I advised that he contact his PCP to be screened for concussion symptoms and we can add his neck to treatment if a referral is sent.  [place] C. Shawnetta Lein PT, DPT 10/18/18 12:21 PM

## 2018-10-22 ENCOUNTER — Telehealth: Payer: Self-pay | Admitting: Adult Health

## 2018-10-22 NOTE — Telephone Encounter (Signed)
RN spoke with [name] regarding recent visit and possible Covid 19 exposure. [name] stated he had been tested the day after he had his appointment but he would get tested again if needed. Rn explained that this is voluntary and offered to make him an appointment. PT was unable to commit to a time and was given phone number to call back. Discussed Covid 19 symptoms and need to follow up if he develops any of the symptoms.

## 2018-10-28 ENCOUNTER — Other Ambulatory Visit: Payer: Self-pay | Admitting: Family Medicine

## 2018-10-31 ENCOUNTER — Other Ambulatory Visit: Payer: Self-pay | Admitting: Family Medicine

## 2018-11-02 ENCOUNTER — Ambulatory Visit: Payer: Medicare Other | Admitting: Physical Therapy

## 2018-11-07 ENCOUNTER — Telehealth: Payer: Self-pay | Admitting: Family Medicine

## 2018-11-07 DIAGNOSIS — Z794 Long term (current) use of insulin: Secondary | ICD-10-CM

## 2018-11-07 DIAGNOSIS — E1142 Type 2 diabetes mellitus with diabetic polyneuropathy: Secondary | ICD-10-CM

## 2018-11-07 DIAGNOSIS — E0843 Diabetes mellitus due to underlying condition with diabetic autonomic (poly)neuropathy: Secondary | ICD-10-CM

## 2018-11-07 NOTE — Telephone Encounter (Signed)
[facility] he just ask [facility] specifically for [facility]supplies? I would hate to put him through new sleep study just for supplies-hopefully he can get in touch with the [place] and get set up for this and if not we can try to write for supplies.  I do not think he needs a formal referral to transfer from Dr. Dwyane Dee to Dr. Loanne Drilling but he just needs to call and state his preference- I thought his diabetes was being cared for by the [facility] and he was satisfied at last visit

## 2018-11-07 NOTE — Telephone Encounter (Signed)
See note  Copied from [place] [id number]. Topic: Quick Communication - Rx Refill/Question >> Nov 07, 2018 11:41 AM Scherrie Gerlach wrote: Medication: supplies for CPAP  [facility] with [facility]AP, but not supplies like new tubing and face mask.  She is asking if the dr will provide additional supplies.  Elena with [facility] will cover cpap supplies Pt [facility] with [facility], never showed [facility] for appt at [facility] pulmonary on 06/30/2013.  Pt will need new referral to pulmonary for new sleep study. Please advise.

## 2018-11-07 NOTE — Telephone Encounter (Signed)
Spoke with patient. He will check with the [facility] to see if he can get results from sleep study done in 2016 to [place] or if the [place] can order his CPAP supplies. If he cannot get this information he will call back to see if Dr. Yong Channel will set him up for another sleep study.   He also wanted to follow-up on endocrinologist. He thought he had an appointment to transfer care from Dr. Loanne Drilling to Dr. Dwyane Dee but he said something happened and he never got the appointment.   OK to place referral for transfer of care from Dr. Loanne Drilling to Dr. Dwyane Dee?

## 2018-11-08 ENCOUNTER — Other Ambulatory Visit: Payer: Self-pay

## 2018-11-08 ENCOUNTER — Encounter: Payer: Self-pay | Admitting: Physical Therapy

## 2018-11-08 ENCOUNTER — Ambulatory Visit: Payer: Medicare Other | Attending: Orthopaedic Surgery | Admitting: Physical Therapy

## 2018-11-08 DIAGNOSIS — G8929 Other chronic pain: Secondary | ICD-10-CM | POA: Diagnosis not present

## 2018-11-08 DIAGNOSIS — R2689 Other abnormalities of gait and mobility: Secondary | ICD-10-CM

## 2018-11-08 DIAGNOSIS — M5442 Lumbago with sciatica, left side: Secondary | ICD-10-CM | POA: Insufficient documentation

## 2018-11-08 DIAGNOSIS — M542 Cervicalgia: Secondary | ICD-10-CM

## 2018-11-08 NOTE — Telephone Encounter (Signed)
Yes thanks-fine for referral to Dr. Buddy Duty if patient has not seen him previously and is okay with this referral

## 2018-11-08 NOTE — Telephone Encounter (Signed)
Called pt and advised. He will check with [facility] about CPAP supplies and call back if he can't get them through the [place].   He has been happy with the [facility] as far as being about to get his blood sugars down but he says that it is so hard to get an appointment there. He would like to have a civilian doctor to follow him as well. He says that Dr. Loanne Drilling told him that he would not agree to having him transfer care and that he would need to find his own new doctor.   OK to refer to Dr. Buddy Duty with [facility]?

## 2018-11-08 NOTE — Patient Instructions (Signed)
Access Code: [email or web address]  URL: [email or web address]  Date: 11/08/2018  Prepared by: Elsie Ra   Exercises  Supine Lower Trunk Rotation - 10 reps - 1-2 sets - 5 hold - 2x daily - 6x weekly  Seated Lumbar Flexion Stretch - 10-20 reps - 1 sets - 5 hold - 2x daily - 6x weekly  Sit to Stand with Armchair - 10 reps - 1-2 sets - 2x daily - 6x weekly  Side Stepping with Counter Support - 3-5 reps - 1 sets - 2x daily - 6x weekly  Standing Tandem Balance with Counter Support - 3 reps - 1 sets - 30 hold - 2x daily - 6x weekly  Romberg Stance - 3 reps - 1 sets - 30 hold - 2x daily - 6x weekly
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
## 2018-11-09 NOTE — Telephone Encounter (Signed)
Referral has been placed. 

## 2018-11-09 NOTE — Addendum Note (Signed)
Addended by: Jasper Loser on: 11/09/2018 07:54 AM   Modules accepted: Orders

## 2018-11-17 ENCOUNTER — Other Ambulatory Visit: Payer: Self-pay

## 2018-11-17 ENCOUNTER — Encounter: Payer: Self-pay | Admitting: Physical Therapy

## 2018-11-17 ENCOUNTER — Ambulatory Visit: Payer: Medicare Other | Admitting: Physical Therapy

## 2018-11-17 DIAGNOSIS — M5442 Lumbago with sciatica, left side: Secondary | ICD-10-CM | POA: Diagnosis not present

## 2018-11-17 DIAGNOSIS — R2689 Other abnormalities of gait and mobility: Secondary | ICD-10-CM | POA: Diagnosis not present

## 2018-11-17 DIAGNOSIS — G8929 Other chronic pain: Secondary | ICD-10-CM | POA: Diagnosis not present

## 2018-11-17 DIAGNOSIS — M542 Cervicalgia: Secondary | ICD-10-CM

## 2018-11-17 NOTE — Therapy (Signed)
[place], [place], [address] Phone: [phone]   Fax:  [phone]  Physical Therapy Treatment  Patient Details  Name: [name] MRN: [id number] Date of Birth: April 03, 1951 Referring Provider (PT): Marybelle Killings, MD   Encounter Date: 11/17/2018  PT End of Session - 11/17/18 1144    Visit Number  2    Number of Visits  16    Date for PT Re-Evaluation  01/03/19    Authorization Type  UHC MCR    PT Start Time  6629   arrived late   PT Stop Time  1226    PT Time Calculation (min)  42 min    Activity Tolerance  Patient tolerated treatment well       Past Medical History:  Diagnosis Date  . Allergy   . Angina   . Arthritis   . Backache, unspecified   . CHF (congestive heart failure) ([place])   . Chills   . Chronic kidney disease, stage III (moderate) (HCC)    HD T- TH-SAT  . Complication of anesthesia    01/2011 could not eat,, hospt x2, was placed on hd and cleared up  . Coronary atherosclerosis of native coronary artery   . Diabetes mellitus 2004  . Dizziness   . Dysphagia, unspecified(787.20)   . Gastroparesis   . Headache(784.0)   . Hemorrhage of rectum and anus   . Hyperlipidemia   . Hypertrophy of prostate with urinary obstruction and other lower urinary tract symptoms (LUTS)   . INTERNAL HEMORRHOIDS 10/16/2008   Qualifier: Diagnosis of  By: Nolon Rod CMA (AAMA), Robin    . Internal hemorrhoids without mention of complication   . Myocardial infarction ([place]) 2002  . Nausea alone   . Obesity   . Other dyspnea and respiratory abnormality   . Other malaise and fatigue   . Personal history of unspecified circulatory disease   . Posttraumatic stress disorder   . Sleep apnea    USES CPAP   . Stroke [facility])    2007  . Unspecified essential hypertension    hx htn     Past Surgical History:  Procedure Laterality Date  . ANAL FISSURECTOMY    . AV FISTULA PLACEMENT    . CARDIAC  CATHETERIZATION     2005 DR BRODIE  . HEMORRHOID SURGERY    . revision of fistula     renal failure  . VENTRAL HERNIA REPAIR  07/01/2011   Procedure: HERNIA REPAIR VENTRAL ADULT;  Surgeon: Joyice Faster. Cornett, MD;  Location: [place];  Service: General;  Laterality: N/A;    There were no vitals filed for this visit.  Subjective Assessment - 11/17/18 1147    Subjective  Pt reports he is still having headaches since his fall, he has called his MD and they are supposed to be setting up another appointment for him to further assess for a concussion.  He is doing his HEP, does have some low back and neck pain with them. He is late today because he felt sick after bending forward.    Patient Stated Goals  get more strength, balance, and endurance    Currently in Pain?  Yes    Pain Score  9     Pain Location  Back    Pain Orientation  Left    Pain Descriptors / Indicators  Nagging    Pain Type  Chronic pain    Pain Radiating Towards  Lt LE  Pain Onset  More than a month ago    Pain Frequency  Constant    Aggravating Factors   bending over especially with bathing/dressing and making breakfast in morning    Pain Relieving Factors  nothing over the last week.         [facility] PT Assessment - 11/17/18 0001      Assessment   Medical Diagnosis  Chronic low back pain with Lt radiculopathy, neck pain, recent fall    Referring Provider (PT)  Marybelle Killings, MD                   [facility] Adult PT Treatment/Exercise - 11/17/18 0001      Exercises   Exercises  Lumbar      Lumbar Exercises: Stretches   Active Hamstring Stretch  Right;Left;30 seconds   seated   Gastroc Stretch  Left;Right;1 rep;30 seconds   at wall     Lumbar Exercises: Aerobic   Nustep  L5x7' U/LE, while discussing his HEP and pain levels.        Lumbar Exercises: Standing   Heel Raises  10 reps   toes in/out/straight   Other Standing Lumbar Exercises  leaning against wall marching with TA engagement.       Lumbar  Exercises: Seated   Other Seated Lumbar Exercises  10 reps BWD leans, VC for form, 10 reps FWD reach holding a 10# wt, the trunk rotation     Other Seated Lumbar Exercises  seated on blue rocker pad, moving arms around, marching, the opposite arm/leg lifts, pelvic circles      Lumbar Exercises: Supine   Bridge  20 reps;Compliant    Isometric Hip Flexion  10 reps   each side     Lumbar Exercises: Sidelying   Clam Limitations  20 reps reverse clams each side, VC for form.               PT Short Term Goals - 11/17/18 1153      PT SHORT TERM GOAL #1   Title  Pt will be I and compliant with HEP. 4 weeks 12/08/18    Status  On-going      PT SHORT TERM GOAL #2   Title  Pt will increase TUG to 13 seconds or less    Status  On-going        PT Long Term Goals - 11/17/18 1153      PT LONG TERM GOAL #1   Title  Pt will improve general strength on Lt side to at least 4+/5 MMT grossly to improve function. (Target for all goals 8 weeks 01/03/19)    Status  On-going      PT LONG TERM GOAL #2   Title  Pt will improve FOTO to less than 54% limited to show improved function    Status  On-going      PT LONG TERM GOAL #3   Title  Pt will improve BERG balance test to >45 to show improved balance and decreased risk for falling.    Status  On-going      PT LONG TERM GOAL #4   Title  Pt will be able to walk at least 2 blocks or grocery store distance with mod I and LRAD.    Status  On-going            Plan - 11/17/18 1228    Clinical Impression Statement  This is Chanson's second visit, he has a lot of  back of the body pain.  He was able to perform all exercises requested today. He reported some increase in pain with the exercise however back to his baseline with rest.  No goals met at this time.    Rehab Potential  Good    PT Frequency  2x / week    PT Duration  8 weeks    PT Treatment/Interventions  ADLs/Self Care Home Management;Cryotherapy;Chief Technology Officer;Therapeutic activities;Therapeutic exercise;Balance training;Neuromuscular re-education;Manual techniques;Passive range of motion;Taping;Joint Manipulations    PT Next Visit Plan  cont with nustep, overall conditioning/strength and balance.    Consulted and Agree with Plan of Care  Patient       Patient will benefit from skilled therapeutic intervention in order to improve the following deficits and impairments:  Abnormal gait, Decreased activity tolerance, Decreased balance, Decreased endurance, Decreased mobility, Decreased range of motion, Decreased strength, Difficulty walking, Dizziness, Hypomobility, Increased fascial restricitons, Increased muscle spasms, Impaired flexibility, Postural dysfunction, Pain, Obesity  Visit Diagnosis: 1. Chronic bilateral low back pain with left-sided sciatica   2. Cervicalgia   3. Other abnormalities of gait and mobility        Problem List Patient Active Problem List   Diagnosis Date Noted  . Spinal stenosis of lumbar region 09/21/2018  . Morbid obesity ([place]) 12/04/2017  . Hyperlipidemia 01/16/2016  . Immunosuppressive management encounter following kidney transplant 07/12/2015  . Renal transplant recipient 06/27/2015  . Orthostatic hypotension 01/15/2015  . Fall at home 01/15/2015  . 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 GERD (gastroesophageal reflux disease) 05/16/2014  . Trigger thumb of right hand 01/03/2014  . Left foot drop 05/02/2013  . Risk for falls 01/22/2012  . Diabetic neuropathy ([place]) 10/14/2010  . Gout 12/22/2009  . Obstructive sleep apnea 07/10/2009  . CAD -s/p DES to marginal vessel at [facility] 2014 04/09/2009  . BPH (benign prostatic hyperplasia) 09/25/2008  . Gastroparesis 06/19/2008  . Low back pain 12/15/2007  . Posttraumatic stress disorder 11/03/2007  . 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) 09/05/2007  . Allergic rhinitis 03/01/2007  . Essential hypertension 01/17/2007  . CVA (cerebral infarction) 01/17/2007    Jeral Pinch PT  11/17/2018, 12:30 PM  [facility] [address] [place], [place], [address] Phone: [phone]   Fax:  [phone]  Name: ALLEN EGERTON MRN: [id number] Date of Birth: September 04, 1950

## 2018-11-21 ENCOUNTER — Ambulatory Visit: Payer: Medicare Other | Admitting: Physical Therapy

## 2018-11-21 ENCOUNTER — Other Ambulatory Visit: Payer: Self-pay

## 2018-11-21 ENCOUNTER — Encounter: Payer: Self-pay | Admitting: Physical Therapy

## 2018-11-21 DIAGNOSIS — G8929 Other chronic pain: Secondary | ICD-10-CM | POA: Diagnosis not present

## 2018-11-21 DIAGNOSIS — M542 Cervicalgia: Secondary | ICD-10-CM

## 2018-11-21 DIAGNOSIS — M5442 Lumbago with sciatica, left side: Secondary | ICD-10-CM

## 2018-11-21 DIAGNOSIS — R2689 Other abnormalities of gait and mobility: Secondary | ICD-10-CM

## 2018-11-21 NOTE — Therapy (Addendum)
[place], [place], [address] Phone: [phone]   Fax:  [phone]  Physical Therapy Treatment/Discharge  Patient Details  Name: [name] MRN: [id number] Date of Birth: Sep 25, 1950 Referring Provider (PT): Marybelle Killings, MD   Encounter Date: 11/21/2018  PT End of Session - 11/21/18 1145    Visit Number  3    Number of Visits  16    Date for PT Re-Evaluation  01/03/19    Authorization Type  [employer] MCR    PT Start Time  1138    PT Stop Time  1218    PT Time Calculation (min)  40 min       Past Medical History:  Diagnosis Date  . Allergy   . Angina   . Arthritis   . Backache, unspecified   . CHF (congestive heart failure) ([facility])   . Chills   . Chronic kidney disease, stage III (moderate) (HCC)    HD T- TH-SAT  . Complication of anesthesia    01/2011 could not eat,, hospt x2, was placed on hd and cleared up  . Coronary atherosclerosis of native coronary artery   . Diabetes mellitus 2004  . Dizziness   . Dysphagia, unspecified(787.20)   . Gastroparesis   . Headache(784.0)   . Hemorrhage of rectum and anus   . Hyperlipidemia   . Hypertrophy of prostate with urinary obstruction and other lower urinary tract symptoms (LUTS)   . INTERNAL HEMORRHOIDS 10/16/2008   Qualifier: Diagnosis of  By: Nolon Rod CMA (AAMA), Robin    . Internal hemorrhoids without mention of complication   . Myocardial infarction ([place]) 2002  . Nausea alone   . Obesity   . Other dyspnea and respiratory abnormality   . Other malaise and fatigue   . Personal history of unspecified circulatory disease   . Posttraumatic stress disorder   . Sleep apnea    USES CPAP   . Stroke [facility])    2007  . Unspecified essential hypertension    hx htn     Past Surgical History:  Procedure Laterality Date  . ANAL FISSURECTOMY    . AV FISTULA PLACEMENT    . CARDIAC CATHETERIZATION     2005 DR BRODIE  . HEMORRHOID SURGERY    .  revision of fistula     renal failure  . VENTRAL HERNIA REPAIR  07/01/2011   Procedure: HERNIA REPAIR VENTRAL ADULT;  Surgeon: Joyice Faster. Cornett, MD;  Location: [facility];  Service: General;  Laterality: N/A;    There were no vitals filed for this visit.  Subjective Assessment - 11/21/18 1140    Subjective  I walked a mile in my neighborhood after last session. Then I felt stiff the next few days. I felt a litle off balance this morning so I brought my rollator.    Currently in Pain?  Yes    Pain Score  7     Pain Location  Back    Pain Orientation  Right;Left    Pain Descriptors / Indicators  Dull;Aching;Sharp    Pain Type  Chronic pain    Pain Radiating Towards  bilateral hips, legs, feet, ankles                       [facility] Adult PT Treatment/Exercise - 11/21/18 0001      Neuro Re-ed    Neuro Re-ed Details   narrow Eyes open and closed, staggered without UE, tandem  with 1 finger touch       Lumbar Exercises: Stretches   Lower Trunk Rotation  10 seconds    Lower Trunk Rotation Limitations  10 reps    Gastroc Stretch Limitations  slant board x 60 sec bilat      Lumbar Exercises: Aerobic   Nustep  L5x7' U/LE, while discussing his HEP and pain levels.        Lumbar Exercises: Standing   Heel Raises  10 reps   toes in/out/straight   Heel Raises Limitations  12 reps each    Other Standing Lumbar Exercises  hip abduction and marching at rollator with abdominal draw in     Other Standing Lumbar Exercises  sit-stand x 10 without UE , needs momentum      Lumbar Exercises: Supine   Glut Set  10 reps    Bridge  20 reps;Compliant    Isometric Hip Flexion  10 reps   each side     Lumbar Exercises: Sidelying   Clam  20 reps    Clam Limitations  20 reps reverse clams each side, VC for form.               PT Short Term Goals - 11/17/18 1153      PT SHORT TERM GOAL #1   Title  Pt will be I and compliant with HEP. 4 weeks 12/08/18    Status  On-going      PT  SHORT TERM GOAL #2   Title  Pt will increase TUG to 13 seconds or less    Status  On-going        PT Long Term Goals - 11/17/18 1153      PT LONG TERM GOAL #1   Title  Pt will improve general strength on Lt side to at least 4+/5 MMT grossly to improve function. (Target for all goals 8 weeks 01/03/19)    Status  On-going      PT LONG TERM GOAL #2   Title  Pt will improve FOTO to less than 54% limited to show improved function    Status  On-going      PT LONG TERM GOAL #3   Title  Pt will improve BERG balance test to >45 to show improved balance and decreased risk for falling.    Status  On-going      PT LONG TERM GOAL #4   Title  Pt will be able to walk at least 2 blocks or grocery store distance with mod I and LRAD.    Status  On-going            Plan - 11/21/18 1206    Clinical Impression Statement  Pt reports walking one mile in neighborhood after last visit without AD. Less pain today. Able to work on balance in parallel bars and progressed hip strength. Some increased pain after standing exercises and with LTR.    PT Next Visit Plan  cont with nustep, overall conditioning/strength and balance.    PT Home Exercise Plan  LTR,sit to stand, sitting lumbar flexion stretch, balance: feet together, mod tandem, side stepping all at counter top    Consulted and Agree with Plan of Care  Patient       Patient will benefit from skilled therapeutic intervention in order to improve the following deficits and impairments:  Abnormal gait, Decreased activity tolerance, Decreased balance, Decreased endurance, Decreased mobility, Decreased range of motion, Decreased strength, Difficulty walking, Dizziness, Hypomobility, Increased fascial restricitons,   Increased muscle spasms, Impaired flexibility, Postural dysfunction, Pain, Obesity  Visit Diagnosis: 1. Cervicalgia   2. Other abnormalities of gait and mobility   3. Chronic bilateral low back pain with left-sided sciatica         Problem List Patient Active Problem List   Diagnosis Date Noted  . Spinal stenosis of lumbar region 09/21/2018  . Morbid obesity ([place]) 12/04/2017  . Hyperlipidemia 01/16/2016  . Immunosuppressive management encounter following kidney transplant 07/12/2015  . Renal transplant recipient 06/27/2015  . Orthostatic hypotension 01/15/2015  . Fall at home 01/15/2015  . Ascending aorta dilatation-4.7 cm per ECHO [facility] 2014 10/23/2014  . GERD (gastroesophageal reflux disease) 05/16/2014  . Trigger thumb of right hand 01/03/2014  . Left foot drop 05/02/2013  . Risk for falls 01/22/2012  . Diabetic neuropathy ([place]) 10/14/2010  . Gout 12/22/2009  . Obstructive sleep apnea 07/10/2009  . CAD -s/p DES to marginal vessel at [facility] 2014 04/09/2009  . BPH (benign prostatic hyperplasia) 09/25/2008  . Gastroparesis 06/19/2008  . Low back pain 12/15/2007  . Posttraumatic stress disorder 11/03/2007  . 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 Allergic rhinitis 03/01/2007  . Essential hypertension 01/17/2007  . CVA (cerebral infarction) 01/17/2007    Dorene Ar, PTA 11/21/2018, 12:43 PM  [facility] [address] [place], [place], [address] Phone: [phone]   Fax:  [phone]  Name: [name] MRN: [id number] Date of Birth: 1950-09-29   PHYSICAL THERAPY DISCHARGE SUMMARY  Visits from Start of Care: 3  Current functional level related to goals / functional outcomes: Unknown, pt exposed to covid and was on hold. Has not returned since.   Remaining deficits: unknown   Education / Equipment: HEP Plan:                                                    Patient goals were not met. Patient is being discharged due to not returning since the last visit.  ?????    Jeral Pinch, PT 01/31/19 8:22 AM

## 2018-11-22 ENCOUNTER — Other Ambulatory Visit: Payer: Self-pay | Admitting: Family Medicine

## 2018-11-22 NOTE — Telephone Encounter (Signed)
See note

## 2018-11-22 NOTE — Telephone Encounter (Signed)
Medication Refill - Medication: glucose blood (ACCU-CHEK AVIVA PLUS) test strip / Pt stated that his test strips are not working with his meter. Requesting refill as soon as possible as he has not been able to check blood sugar today, and stated he is testing 3x a day now. Original directions state to test 2x a day. Please advise.   Has the patient contacted their pharmacy? Yes.   (Agent: If no, request that the patient contact the pharmacy for the refill.) (Agent: If yes, when and what did the pharmacy advise?)  Preferred Pharmacy (with phone number or street name): [id number] [place], [place] - [place] [phone] (Phone) [phone] (Fax)     Agent: Please be advised that RX refills may take up to 3 business days. We ask that you follow-up with your pharmacy.

## 2018-11-22 NOTE — Telephone Encounter (Signed)
Rx refilled, called pt, confirmed meter and testing frequency. Pt aware.

## 2018-11-28 ENCOUNTER — Encounter: Payer: Self-pay | Admitting: Physical Therapy

## 2018-11-28 ENCOUNTER — Ambulatory Visit: Payer: Medicare Other | Admitting: Physical Therapy

## 2018-12-01 ENCOUNTER — Ambulatory Visit: Payer: Medicare Other | Admitting: Physical Therapy

## 2018-12-05 ENCOUNTER — Ambulatory Visit: Payer: Medicare Other | Admitting: Physical Therapy

## 2018-12-08 ENCOUNTER — Encounter: Payer: Medicare Other | Admitting: Physical Therapy

## 2018-12-12 DIAGNOSIS — M109 Gout, unspecified: Secondary | ICD-10-CM | POA: Diagnosis not present

## 2018-12-12 DIAGNOSIS — I129 Hypertensive chronic kidney disease with stage 1 through stage 4 chronic kidney disease, or unspecified chronic kidney disease: Secondary | ICD-10-CM | POA: Diagnosis not present

## 2018-12-12 DIAGNOSIS — Z940 Kidney transplant status: Secondary | ICD-10-CM | POA: Diagnosis not present

## 2018-12-15 ENCOUNTER — Telehealth: Payer: Self-pay | Admitting: Neurology

## 2018-12-15 NOTE — Telephone Encounter (Signed)
Called and spoke with Pt.  He states The left side is worse, effecting his balance. He is in P/T, though had to quarantine for almost 20 days due to contact with a family member with [place]. He has since tested negative.  Standing is not as painful, but trying to sleep at night is excruciating, not sleeping more than an hour at a time. Pain starts in his left hip in the back and radiates down the leg and into his feet.  He believes he is on the max dose of gabapentin due to his kidney transplant, but would like to know if there is possibly anything else that could help.  Pt is aware return call will be 12/16/18

## 2018-12-15 NOTE — Telephone Encounter (Signed)
Patient called in that he is in a lot of pain in the left leg and your feet. Left foot worse than right. Wanting to talk about what he should do or if there is some medication.

## 2018-12-16 NOTE — Telephone Encounter (Signed)
We can start nortriptyline 10mg  at bedtime.  We can increase dose in 4 weeks if needed.  He should consult with his nephrologist if Lyrica or Cymbalta could be options.

## 2018-12-16 NOTE — Telephone Encounter (Signed)
Called and LMOVM advisng Pt of nortriptyline and to call nephrologist about Lyrica or Cymbalta. I advised Pt to call Monday anytime after 8am with questions

## 2018-12-19 MED ORDER — NORTRIPTYLINE HCL 10 MG PO CAPS: 10.0000 mg | ORAL_CAPSULE | Freq: Every day | ORAL | 1 refills | Status: DC

## 2018-12-19 NOTE — Telephone Encounter (Signed)
patient left msg with after hours returning your call.

## 2018-12-19 NOTE — Addendum Note (Signed)
Addended by: Clois Comber on: 12/19/2018 01:43 PM   Modules accepted: Orders

## 2018-12-21 NOTE — Telephone Encounter (Signed)
Called and spoke with pt. He rcvd my message and was calling back to let me know.

## 2018-12-26 DIAGNOSIS — N2581 Secondary hyperparathyroidism of renal origin: Secondary | ICD-10-CM | POA: Diagnosis not present

## 2018-12-26 DIAGNOSIS — I129 Hypertensive chronic kidney disease with stage 1 through stage 4 chronic kidney disease, or unspecified chronic kidney disease: Secondary | ICD-10-CM | POA: Diagnosis not present

## 2018-12-26 DIAGNOSIS — Z940 Kidney transplant status: Secondary | ICD-10-CM | POA: Diagnosis not present

## 2018-12-26 DIAGNOSIS — R894 Abnormal immunological findings in specimens from other organs, systems and tissues: Secondary | ICD-10-CM | POA: Diagnosis not present

## 2018-12-26 DIAGNOSIS — E1129 Type 2 diabetes mellitus with other diabetic kidney complication: Secondary | ICD-10-CM | POA: Diagnosis not present

## 2018-12-26 DIAGNOSIS — E785 Hyperlipidemia, unspecified: Secondary | ICD-10-CM | POA: Diagnosis not present

## 2018-12-26 DIAGNOSIS — E559 Vitamin D deficiency, unspecified: Secondary | ICD-10-CM | POA: Diagnosis not present

## 2018-12-28 ENCOUNTER — Other Ambulatory Visit: Payer: Self-pay | Admitting: Radiology

## 2018-12-28 DIAGNOSIS — G8929 Other chronic pain: Secondary | ICD-10-CM

## 2018-12-28 DIAGNOSIS — M545 Low back pain, unspecified: Secondary | ICD-10-CM

## 2019-01-11 ENCOUNTER — Other Ambulatory Visit: Payer: Self-pay | Admitting: Neurology

## 2019-01-11 NOTE — Progress Notes (Signed)
Phone: [phone]   Subjective:  Patient presents today for their annual physical. Chief complaint-noted.   See problem oriented charting- ROS- full  review of systems was completed and negative except for: cough, anal bleeding, joint pain, back pain, neck pain, neck stiffness, dizzy, lightheaded, numbness, weakness  The following were reviewed and entered/updated in epic: Past Medical History:  Diagnosis Date  . Allergy   . Angina   . Arthritis   . Backache, unspecified   . CHF (congestive heart failure) ([place])   . Chills   . Chronic kidney disease, stage III (moderate) (HCC)    HD T- TH-SAT  . Complication of anesthesia    01/2011 could not eat,, hospt x2, was placed on hd and cleared up  . Coronary atherosclerosis of native coronary artery   . Diabetes mellitus 2004  . Dizziness   . Dysphagia, unspecified(787.20)   . Gastroparesis   . Headache(784.0)   . Hemorrhage of rectum and anus   . Hyperlipidemia   . Hypertrophy of prostate with urinary obstruction and other lower urinary tract symptoms (LUTS)   . INTERNAL HEMORRHOIDS 10/16/2008   Qualifier: Diagnosis of  By: Nolon Rod CMA (AAMA), Robin    . Internal hemorrhoids without mention of complication   . Myocardial infarction ([place]) 2002  . Nausea alone   . Obesity   . Other dyspnea and respiratory abnormality   . Other malaise and fatigue   . Personal history of unspecified circulatory disease   . Posttraumatic stress disorder   . Sleep apnea    USES CPAP   . Stroke [facility])    2007  . Unspecified essential hypertension    hx htn    Patient Active Problem List   Diagnosis Date Noted  . PAD (peripheral artery disease) ([place]) 01/16/2019    Priority: High  . Morbid obesity ([place]) 12/04/2017    Priority: High  . 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 Renal transplant recipient 06/27/2015    Priority: High  . 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 Diabetic neuropathy ([place]) 10/14/2010    Priority: High  . CAD -s/p DES to marginal vessel at [facility] 04/09/2009    Priority: High  . Diabetes mellitus due to underlying condition with diabetic autonomic (poly)neuropathy ([place]) 09/05/2007    Priority: High  . Hyperlipidemia 01/16/2016    Priority: Medium  . Gout 12/22/2009    Priority: Medium  . Obstructive sleep apnea 07/10/2009    Priority: Medium  . BPH (benign prostatic hyperplasia) 09/25/2008    Priority: Medium  . Low back pain 12/15/2007    Priority: Medium  . Posttraumatic stress disorder 11/03/2007    Priority: Medium  . Essential hypertension 01/17/2007    Priority: Medium  . CVA (cerebral infarction) 01/17/2007    Priority: Medium  . GERD (gastroesophageal reflux disease) 05/16/2014    Priority: Low  . Trigger thumb of right hand 01/03/2014    Priority: Low  . Left foot drop 05/02/2013    Priority: Low  . Risk for falls 01/22/2012    Priority: Low  . Gastroparesis 06/19/2008    Priority: Low  . Allergic rhinitis 03/01/2007    Priority: Low  . Coronary artery disease of native artery of native heart with stable angina pectoris ([place]) 01/16/2019  . Spinal stenosis of lumbar region 09/21/2018  . Orthostatic hypotension 01/15/2015  . Fall at home 01/15/2015  Past Surgical History:  Procedure Laterality Date  . ANAL FISSURECTOMY    . AV FISTULA PLACEMENT    . CARDIAC CATHETERIZATION     2005 DR BRODIE  . HEMORRHOID SURGERY    . revision of fistula     renal failure  . VENTRAL HERNIA REPAIR  07/01/2011   Procedure: HERNIA REPAIR VENTRAL ADULT;  Surgeon: Joyice Faster. Cornett, MD;  Location: [place] OR;  Service: General;  Laterality: N/A;    Family History  Problem Relation Age of Onset  . Heart disease Father   . Renal Disease Father   . Dementia Mother   . Heart attack Mother   . Diabetes Sister   . Heart disease Sister   . Diabetes Maternal Aunt   . Diabetes  Maternal Uncle   . Diabetes Paternal Aunt   . Diabetes Paternal Uncle   . Heart disease Unknown   . Heart attack Brother   . Lung cancer Sister   . Renal Disease Brother     Medications- reviewed and updated Current Outpatient Medications  Medication Sig Dispense Refill  . acetaminophen (TYLENOL) 500 MG tablet Take 1,000 mg by mouth as needed for mild pain or headache.     . allopurinol (ZYLOPRIM) 100 MG tablet Take 100 mg by mouth 2 (two) times daily.     [name] amLODipine (NORVASC) 10 MG tablet Take 10 mg by mouth daily.    [name] aspirin 81 MG tablet Take 81 mg by mouth at bedtime.     [name] atorvastatin (LIPITOR) 40 MG tablet Take 40 mg by mouth daily.    . clopidogrel (PLAVIX) 75 MG tablet Take 75 mg by mouth daily.     . Continuous Blood Gluc Receiver (FREESTYLE LIBRE 14 DAY READER) DEVI 1 Device by Does not apply route daily. 1 Device 0  . Continuous Blood Gluc Sensor (FREESTYLE LIBRE 14 DAY SENSOR) MISC Place 1 Units onto the skin every 14 (fourteen) days. 3 each 11  . furosemide (LASIX) 40 MG tablet Take 1 tablet (40 mg total) by mouth daily. (Patient taking differently: Take 20 mg by mouth daily. ) 30 tablet   . gabapentin (NEURONTIN) 300 MG capsule Take 2 capsules in AM, 1 capsule at noon and 3 capsules at bedtime (Patient taking differently: Take 3 capsules in AM, 3 capsule at noon and 3 capsules at bedtime) 180 capsule 5  . glucose blood (ACCU-CHEK AVIVA PLUS) test strip Use when checking blood sugar three times daily. E08.43, Z79.4, E11.42 300 strip 1  . insulin glargine (LANTUS) 100 UNIT/ML injection Inject 75 Units into the skin 2 (two) times daily.    . Insulin Syringe-Needle U-100 (B-D INS SYR ULTRAFINE 1CC/31G) 31G X 5/16" 1 ML MISC Used to give insulin injections twice daily. 100 each 11  . Magnesium 200 MG TABS Take 200 mg by mouth daily with lunch.     . mycophenolate (MYFORTIC) 180 MG EC tablet Take 180 mg by mouth 2 (two) times daily.    . nitroGLYCERIN (NITROSTAT) 0.4 MG SL  tablet PLACE 1 TABLET (0.4 MG TOTAL) UNDER THE TONGUE EVERY 5 (FIVE) MINUTES AS NEEDED FOR CHEST PAIN. 25 tablet 2  . nortriptyline (PAMELOR) 10 MG capsule TAKE 1 CAPSULE (10 MG TOTAL) BY MOUTH AT BEDTIME. 90 capsule 1  . omeprazole (PRILOSEC) 20 MG capsule Take 1 capsule (20 mg total) by mouth 2 (two) times daily before a meal. 180 capsule 3  . Semaglutide,0.25 or 0.5MG /DOS, (OZEMPIC, 0.25 OR 0.5 MG/DOSE,) 2 MG/1.5ML  SOPN Inject into the skin once a week.    . tacrolimus (PROGRAF) 1 MG capsule 5 capsules 2 times daily    . cyclobenzaprine (FLEXERIL) 5 MG tablet Take 0.5-1 tablets (2.5-5 mg total) 3 (three) times daily as needed by mouth for muscle spasms. (Patient not taking: Reported on 09/21/2018) 30 tablet 0  . diazepam (VALIUM) 5 MG tablet Take 1 tablet (5 mg total) by mouth at bedtime. (Patient not taking: Reported on 01/16/2019) 30 tablet 0  . Tacrolimus 1 MG CP24 Take 4 mg by mouth 2 (two) times daily.      No current facility-administered medications for this visit.     Allergies-reviewed and updated No Known Allergies  Social History   Social History Narrative   Married [id number]. 2 year old son in 2015. 1 granddaughter from [place].       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 He lives with his wife. He drinks 3-4 cups of coffee a day. He walks most every day.   Objective  Objective:  BP 120/78 (BP Location: Right Arm, Patient Position: Sitting, Cuff Size: Normal)   Pulse 86   Temp 99.4 F (37.4 C) (Oral)   Ht 5' 10.5" (1.791 m)   Wt 257 lb 6.4 oz (116.8 kg)   SpO2 95%   BMI 36.41 kg/m  Gen: NAD, resting comfortably HEENT: Mucous membranes are moist. Oropharynx normal Neck: no thyromegaly or cervical lymphadenopathy CV: RRR no murmurs rubs or gallops Lungs: CTAB no crackles, wheeze, rhonchi Abdomen:  soft/nontender/nondistended/normal bowel sounds. No rebound or guarding.  Ext: no edema Skin: warm, dry Neuro: grossly normal, moves all extremities, PERRLA   Diabetic Foot Exam - Simple   Simple Foot Form Diabetic Foot exam was performed with the following findings: Yes 01/16/2019 11:31 AM  Visual Inspection No deformities, no ulcerations, no other skin breakdown bilaterally: Yes Sensation Testing See comments: Yes Pulse Check See comments: Yes Comments No sensation on left foot, some sensation on top of right foot. Weak DP pulse on right - otherwise cannot palpate pedal pulses (prior vascular evaluation and no further workup as long as no claudication)      Assessment and Plan   68 y.o. male presenting for annual physical.  Health Maintenance counseling: 1. Anticipatory guidance: Patient counseled regarding regular dental exams -q6 months other than due to covid 19, eye exams - yearly (need records),  avoiding smoking and second hand smoke , limiting alcohol to 1 beverage per day .   2. Risk factor reduction:  Advised patient of need for regular exercise and diet rich and fruits and vegetables to reduce risk of heart attack and stroke. Exercise- trying to [place] when he has someone to walk with him. Diet-hungrier off metformin.  Wt Readings from Last 3 Encounters:  01/16/19 257 lb 6.4 oz (116.8 kg)  10/14/18 244 lb (110.7 kg)  09/30/18 244 lb (110.7 kg)  3. Immunizations/screenings/ancillary studies- flu shot today. Cautious about vaccines beyond this due to renal transplant Immunization History  Administered Date(s) Administered  . Influenza Split 02/23/2012  . Influenza Whole 02/23/2000  . Influenza, High Dose Seasonal PF 05/01/2016  . Influenza-Unspecified 03/07/2014, 02/18/2015, 04/08/2016, 02/15/2017  . Pneumococcal Conjugate-13 12/03/2017  . Pneumococcal Polysaccharide-23 05/26/2012, 10/25/2012   Health Maintenance Due  Topic Date Due  . TETANUS/TDAP - check with Dr.  Lorrene Reid to see if its ok for you to get Tetanus shoat at your pharmacy (its not covered in office here with your insurance) 11/23/1969  . COLONOSCOPY - Sign release of information at the check out desk for last colonoscopy only from [facility]. Plus referring you back today 11/23/1998  . OPHTHALMOLOGY EXAM - Sign release of information at the check out desk for last diabetic eye exam from the [facility] 10/13/2017  . FOOT EXAM - today 09/28/2018  . HEMOGLOBIN A1C - today with labs 12/02/2018  . INFLUENZA VACCINE - today high dose 12/24/2018   4. [facility]- [facility] will [facility] to [facility]. Has had high # in past and biopsy considered- he opted out. Had normal bone scan thankfully 5. Colon cancer screening - try to get records plus refer back to gi 7. Skin cancer screening- no dermatologist. advised regular sunscreen use. Denies worrisome, changing, or new skin lesions.  8. never smoker  Status of chronic or acute concerns  Renal transplant- doing well on myfortic and tacrolimus reports had to be adjusted due to a viral infection in last 6 months.   Hypertension - Taking Amlodipine 10 mg daily and Furosemide 20 mg daily.  GERD - Taking Omeprazole 20 mg BID. Still gets reflux even with that dose since July- get some cough related to this. Wants to see Gi again- referred today  Diabetes - Taking Metformin 1000 mg BID,  Lantus 75 units BID and ozempic. Not having to use humalog  Hyperlipidemia - Taking Atorvastatin 40 mg daily.   Morbid Obesity - hypertension and hld with BMI over 35. Discussed importance of weight loss- metformin was helping him with weight loss an dhas been harder off that.   Gout - Taking Allopurinol 100 mg BID.   CAD- stable without recent chest pain or increased shortness of breath. 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   FOr PAD- stable with no claudication. Continue risk factor modification as per CAD/HLD/Hypertension.  11/24/17 Dr. Donnetta Hutching evaluation "DATA:  Noninvasive studies at [facility] showed normal ankle arm index on the right and 0.72 on the left  Lower extremity arterial duplex in our office today reveals 2 areas of stenosis in the superficial femoral artery.  MEDICAL ISSUES: Had long discussion with the patient.  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  I do not feel that his symptoms are related to arterial insufficiency.  He does not have any pain at exercise and his pain at rest is from his knee distally which would not be consistent with arterial rest pain.  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  He was reassured with these discussion will see [place] again on an as-needed basis"  Recommended follow up: 6 months Future Appointments  Date Time Provider [place]  02/20/2019 10:50 AM Metta Clines R, DO [facility] None   Lab/Order associations: banana and half a muffin and coffee    ICD-10-CM   1. Preventative health care  Z00.00 CBC    Comprehensive metabolic panel    LDL cholesterol,  direct    Hemoglobin A1c  2. Coronary artery disease of native artery of native heart with stable angina pectoris (HCC) Chronic I25.118   3. Diabetes mellitus due to underlying condition with diabetic autonomic neuropathy, with long-term current use of insulin (HCC)  E08.43 CBC   Z79.4 Comprehensive metabolic panel    LDL cholesterol, direct    Hemoglobin A1c  4. Hyperlipidemia, unspecified hyperlipidemia type  E78.5   5. Benign prostatic hyperplasia, unspecified whether lower  urinary tract symptoms present  N40.0   6. Essential hypertension  I10   7. Gout, unspecified cause, unspecified chronicity, unspecified site  M10.9 Uric acid  8. Gastroesophageal reflux disease without esophagitis  K21.9 Ambulatory referral to Gastroenterology  9. Screening for prostate cancer  Z12.5 CANCELED: PSA  10. PAD (peripheral artery disease) (HCC)  I73.9    Return precautions advised.  Garret Reddish, MD

## 2019-01-11 NOTE — Patient Instructions (Addendum)
Health Maintenance Due  Topic Date Due  . TETANUS/TDAP - check with Dr. Lorrene Reid to see if its ok for you to get Tetanus shoat at your pharmacy (its not covered in office here with your insurance) 11/23/1969  . COLONOSCOPY - Sign release of information at the check out desk for last colonoscopy only from [facility]. Plus referring you back today 11/23/1998  . OPHTHALMOLOGY EXAM - Sign release of information at the check out desk for last diabetic eye exam from the [facility] 10/13/2017  . FOOT EXAM - today 09/28/2018  . HEMOGLOBIN A1C - today with labs 12/02/2018  . INFLUENZA VACCINE - today high dose 12/24/2018   Please stop by lab before you go If you do not have mychart- we will call you about results within 5 business days of [place] receiving them.  If you have mychart- we will send your results within 3 business days of [place] receiving them.  If abnormal or we want to clarify a result, we will call or mychart you to make sure you receive the message.  If you have questions or concerns or don't hear within 5-7 days, please send [place] a message or call us.    We will call you within two weeks about your referral to [facility]. If you do not hear within 3 weeks, give [place] a call.

## 2019-01-16 ENCOUNTER — Ambulatory Visit (INDEPENDENT_AMBULATORY_CARE_PROVIDER_SITE_OTHER): Payer: Medicare Other | Admitting: Family Medicine

## 2019-01-16 ENCOUNTER — Encounter: Payer: Self-pay | Admitting: Family Medicine

## 2019-01-16 ENCOUNTER — Other Ambulatory Visit: Payer: Self-pay

## 2019-01-16 VITALS — BP 120/78 | HR 86 | Temp 99.4°F | Ht 70.5 in | Wt 257.4 lb

## 2019-01-16 DIAGNOSIS — Z794 Long term (current) use of insulin: Secondary | ICD-10-CM

## 2019-01-16 DIAGNOSIS — I25118 Atherosclerotic heart disease of native coronary artery with other forms of angina pectoris: Secondary | ICD-10-CM

## 2019-01-16 DIAGNOSIS — N400 Enlarged prostate without lower urinary tract symptoms: Secondary | ICD-10-CM

## 2019-01-16 DIAGNOSIS — Z0000 Encounter for general adult medical examination without abnormal findings: Secondary | ICD-10-CM | POA: Diagnosis not present

## 2019-01-16 DIAGNOSIS — E785 Hyperlipidemia, unspecified: Secondary | ICD-10-CM

## 2019-01-16 DIAGNOSIS — E0843 Diabetes mellitus due to underlying condition with diabetic autonomic (poly)neuropathy: Secondary | ICD-10-CM | POA: Diagnosis not present

## 2019-01-16 DIAGNOSIS — I739 Peripheral vascular disease, unspecified: Secondary | ICD-10-CM

## 2019-01-16 DIAGNOSIS — K219 Gastro-esophageal reflux disease without esophagitis: Secondary | ICD-10-CM

## 2019-01-16 DIAGNOSIS — M109 Gout, unspecified: Secondary | ICD-10-CM

## 2019-01-16 DIAGNOSIS — Z23 Encounter for immunization: Secondary | ICD-10-CM | POA: Diagnosis not present

## 2019-01-16 DIAGNOSIS — I10 Essential (primary) hypertension: Secondary | ICD-10-CM

## 2019-01-16 DIAGNOSIS — Z125 Encounter for screening for malignant neoplasm of prostate: Secondary | ICD-10-CM

## 2019-01-16 LAB — CBC
HCT: 43.4 % (ref 39.0–52.0)
Hemoglobin: 14.2 g/dL (ref 13.0–17.0)
MCHC: 32.7 g/dL (ref 30.0–36.0)
MCV: 83.3 fl (ref 78.0–100.0)
Platelets: 261 10*3/uL (ref 150.0–400.0)
RBC: 5.21 Mil/uL (ref 4.22–5.81)
RDW: 15.6 % — ABNORMAL HIGH (ref 11.5–15.5)
WBC: 3.9 10*3/uL — ABNORMAL LOW (ref 4.0–10.5)

## 2019-01-16 LAB — COMPREHENSIVE METABOLIC PANEL
ALT: 17 U/L (ref 0–53)
AST: 16 U/L (ref 0–37)
Albumin: 4.2 g/dL (ref 3.5–5.2)
Alkaline Phosphatase: 96 U/L (ref 39–117)
BUN: 17 mg/dL (ref 6–23)
CO2: 32 mEq/L (ref 19–32)
Calcium: 9.4 mg/dL (ref 8.4–10.5)
Chloride: 94 mEq/L — ABNORMAL LOW (ref 96–112)
Creatinine, Ser: 1.64 mg/dL — ABNORMAL HIGH (ref 0.40–1.50)
GFR: 50.48 mL/min — ABNORMAL LOW (ref >60.00)
Glucose, Bld: 380 mg/dL — ABNORMAL HIGH (ref 70–99)
Potassium: 3.8 mEq/L (ref 3.5–5.1)
Sodium: 134 mEq/L — ABNORMAL LOW (ref 135–145)
Total Bilirubin: 0.6 mg/dL (ref 0.2–1.2)
Total Protein: 7.2 g/dL (ref 6.0–8.3)

## 2019-01-16 LAB — URIC ACID: Uric Acid, Serum: 6.6 mg/dL (ref 4.0–7.8)

## 2019-01-16 LAB — LDL CHOLESTEROL, DIRECT: Direct LDL: 75 mg/dL

## 2019-01-16 LAB — HEMOGLOBIN A1C: Hgb A1c MFr Bld: 10.5 % — ABNORMAL HIGH (ref 4.6–6.5)

## 2019-01-16 NOTE — Addendum Note (Signed)
Addended by: Deveron Furlong D on: 01/16/2019 11:48 AM   Modules accepted: Orders

## 2019-01-16 NOTE — Assessment & Plan Note (Signed)
FOr PAD- stable with no claudication. 11/24/17 Dr. Donnetta Hutching evaluation "DATA:  Noninvasive studies at [facility] showed normal ankle arm index on the right and 0.72 on the left  Lower extremity arterial duplex in our office today reveals 2 areas of stenosis in the superficial femoral artery.  MEDICAL ISSUES: Had long discussion with the patient.  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  I do not feel that his symptoms are related to arterial insufficiency.  He does not have any pain at exercise and his pain at rest is from his knee distally which would not be consistent with arterial rest pain.  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  He was reassured with these discussion will see [place] again on an as-needed basis"

## 2019-02-06 ENCOUNTER — Encounter: Payer: Self-pay | Admitting: Family Medicine

## 2019-02-07 NOTE — Telephone Encounter (Signed)
Called and spoke with patient. He was having sx when he last saw Dr. Yong Channel but they have been getting worse over time. He did not mention it to Dr. Yong Channel because it wasn't that bad at the time. He is having a lot of pain in the L shoulder/periscpular, arm and hand. Reports chronic neck pain since fall 4 months ago. He reports decreased grip strength and ROM. Denies CP, SOB, slurred speech, HA. He has daily morning nausea and dizziness since fall.   The nurse at the [facility] mention something about PK or TK and he is unsure about this, wants to know what it is and what Dr. Yong Channel thinks about it. She said it may have something to do with his Gabapentin.   Forwarding to Dr. Yong Channel

## 2019-02-08 ENCOUNTER — Telehealth: Payer: Self-pay | Admitting: Physical Therapy

## 2019-02-08 NOTE — Telephone Encounter (Signed)
Called pt and left VM to call the office.  

## 2019-02-08 NOTE — Telephone Encounter (Signed)
Per Dr. Yong Channel - I would have him find out what the nurse was concerned about specifically about specifically and write it down-and schedule a visit with me to discuss. [facility] also [facility] about [facility].   Definitely needs a visit since this is a new symptom not previously reported-if he has chest pain or shortness of breath needs to seek care in emergency room immediately. Also needs to seek care if has shoulder pain with exertion.  Thanks, Garret Reddish

## 2019-02-08 NOTE — Telephone Encounter (Signed)
Copied from [place] [id number]. Topic: General - Other >> Feb 08, 2019  1:21 PM [name] wrote: Patient would like a callback from [facility] in regards to scheduling an appt for tomorrow in regards to [name].

## 2019-02-14 NOTE — Telephone Encounter (Signed)
Unable to reach pt by phone, sent message via [place].

## 2019-02-17 DIAGNOSIS — Z940 Kidney transplant status: Secondary | ICD-10-CM | POA: Diagnosis not present

## 2019-02-17 DIAGNOSIS — I129 Hypertensive chronic kidney disease with stage 1 through stage 4 chronic kidney disease, or unspecified chronic kidney disease: Secondary | ICD-10-CM | POA: Diagnosis not present

## 2019-02-19 NOTE — Progress Notes (Signed)
Virtual Visit via Telephone Note The purpose of this virtual visit is to provide medical care while limiting exposure to the novel coronavirus.    Consent was obtained for phone visit:  Yes.   Answered questions that patient had about telehealth interaction:  Yes.   I discussed the limitations, risks, security and privacy concerns of performing an evaluation and management service by telephone. I also discussed with the patient that there may be a patient responsible charge related to this service. The patient expressed understanding and agreed to proceed.  Pt location: Home Physician Location: office Name of referring provider:  [name], MD I connected with .[name] at patients initiation/request on 02/20/2019 at 10:50 AM EDT by telephone and verified that I am speaking with the correct person using two identifiers.  Pt MRN:  [id number] Pt DOB:  [birth year]   History of Present Illness:  Middleton Daquino is a 68 year old right-handed man with type 2 diabetes mellitus complicated by polyneuropathy, renal failure status post transplant 2017, gastroparesis, CHF, CAD, and history of stroke who follows up for diabetic polyneuropathy and lumbosacral radiculopathy.  UPDATE: We were supposed to have a virtual visit but due to complications, we switched to a phone encounter.   Due to worsening lumbosacral radicular pain, gabapentin was increased and he was started on nortriptyline.  He saw the orthopedist.  MRI of lumbar spine from 10/07/2018 showed slight progression of degenerative disc disease with mild left-predominant multilevel neural foraminal stenosis but no spinal stenosis.  He also endorsed neck pain, so MRI of cervical spine was also performed, which demonstrated mild degenerative disc disease predominantly at C5-6 with mild bilateral neural foraminal stenosis but no spinal stenosis.  He was referred to physical therapy  Current medication:  Gabapentin 600mg /300mg /900mg ;.   He had started on nortriptyline 10mg  at bedtime but never had any refills.  He thought it helped a little at night.  He reports that at the [place] they are performing tests because he is exhibiting tremor and jerks in his hands.    HISTORY: He has history lumbar radiculopathy down both hips and legs (worse on the left) since injuring his back in the [employer] many years ago. MRI of lumbar spine from 06/16/13 showed disc bulge and facet arthropathy with moderate biforaminal narrowing at L5-S1. In the past he would get epidural injections which helped for about 6 months. He has diabetic neuropathy and has numbness and pain in the feet. He also has gout in the big toes. He has problems with balance. He has some residual weakness in the right leg due to stroke. 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 He has associated numbness. In 2020, pain in left leg has started to become worse.  Past medications:  Tramadol 50mg ; Lyrica (aggravated PTSD);   Past Medical History: Past Medical History:  Diagnosis Date  . Allergy   . Angina   . Arthritis   . Backache, unspecified   . CHF (congestive heart failure) ([place])   . Chills   . Chronic kidney disease, stage III (moderate) (HCC)    HD T- TH-SAT  . Complication of anesthesia    01/2011 could not eat,, hospt x2, was placed on hd and cleared up  . Coronary atherosclerosis of native coronary artery   . Diabetes mellitus 2004  . Dizziness   . Dysphagia, unspecified(787.20)   . Gastroparesis   . Headache(784.0)   . Hemorrhage of rectum and anus   .  Hyperlipidemia   . Hypertrophy of prostate with urinary obstruction and other lower urinary tract symptoms (LUTS)   . INTERNAL HEMORRHOIDS 10/16/2008   Qualifier: Diagnosis of  By: Nolon Rod CMA (AAMA), Robin    . Internal hemorrhoids without mention of complication   . Myocardial infarction ([place]) 2002  . Nausea alone   . Obesity   . Other dyspnea and respiratory abnormality   . Other  malaise and fatigue   . Personal history of unspecified circulatory disease   . Posttraumatic stress disorder   . Sleep apnea    USES CPAP   . Stroke [facility])    2007  . Unspecified essential hypertension    hx htn     Medications: Outpatient Encounter Medications as of 02/20/2019  Medication Sig Note  . acetaminophen (TYLENOL) 500 MG tablet Take 1,000 mg by mouth as needed for mild pain or headache.    . allopurinol (ZYLOPRIM) 100 MG tablet Take 100 mg by mouth 2 (two) times daily.    [name] amLODipine (NORVASC) 10 MG tablet Take 10 mg by mouth daily.   [name] aspirin 81 MG tablet Take 81 mg by mouth at bedtime.  11/21/2017: Patient received additional tablets of this today  . atorvastatin (LIPITOR) 40 MG tablet Take 40 mg by mouth daily.   . clopidogrel (PLAVIX) 75 MG tablet Take 75 mg by mouth daily.    . Continuous Blood Gluc Receiver (FREESTYLE LIBRE 14 DAY READER) DEVI 1 Device by Does not apply route daily.   . Continuous Blood Gluc Sensor (FREESTYLE LIBRE 14 DAY SENSOR) MISC Place 1 Units onto the skin every 14 (fourteen) days.   . cyclobenzaprine (FLEXERIL) 5 MG tablet Take 0.5-1 tablets (2.5-5 mg total) 3 (three) times daily as needed by mouth for muscle spasms. (Patient not taking: Reported on 09/21/2018)   . diazepam (VALIUM) 5 MG tablet Take 1 tablet (5 mg total) by mouth at bedtime. (Patient not taking: Reported on 01/16/2019)   . furosemide (LASIX) 40 MG tablet Take 1 tablet (40 mg total) by mouth daily. (Patient taking differently: Take 20 mg by mouth daily. )   . gabapentin (NEURONTIN) 300 MG capsule Take 2 capsules in AM, 1 capsule at noon and 3 capsules at bedtime (Patient taking differently: Take 3 capsules in AM, 3 capsule at noon and 3 capsules at bedtime)   . glucose blood (ACCU-CHEK AVIVA PLUS) test strip Use when checking blood sugar three times daily. E08.43, Z79.4, E11.42   . insulin glargine (LANTUS) 100 UNIT/ML injection Inject 75 Units into the skin 2 (two) times daily.   .  Insulin Syringe-Needle U-100 (B-D INS SYR ULTRAFINE 1CC/31G) 31G X 5/16" 1 ML MISC Used to give insulin injections twice daily.   . Magnesium 200 MG TABS Take 200 mg by mouth daily with lunch.    . mycophenolate (MYFORTIC) 180 MG EC tablet Take 180 mg by mouth 2 (two) times daily. 11/21/2017: Regimen confirmed to be accurate by the patient  . nitroGLYCERIN (NITROSTAT) 0.4 MG SL tablet PLACE 1 TABLET (0.4 MG TOTAL) UNDER THE TONGUE EVERY 5 (FIVE) MINUTES AS NEEDED FOR CHEST PAIN.   [name] nortriptyline (PAMELOR) 10 MG capsule TAKE 1 CAPSULE (10 MG TOTAL) BY MOUTH AT BEDTIME.   [name] omeprazole (PRILOSEC) 20 MG capsule Take 1 capsule (20 mg total) by mouth 2 (two) times daily before a meal.   . Semaglutide,0.25 or 0.5MG /DOS, (OZEMPIC, 0.25 OR 0.5 MG/DOSE,) 2 MG/1.5ML SOPN Inject into the skin once a week.   [name]  tacrolimus (PROGRAF) 1 MG capsule 5 capsules 2 times daily   . Tacrolimus 1 MG CP24 Take 4 mg by mouth 2 (two) times daily.  11/21/2017: Regimen confirmed to be accurate by the patient   No facility-administered encounter medications on file as of 02/20/2019.     Allergies: No Known Allergies  Family History: Family History  Problem Relation Age of Onset  . Heart disease Father   . Renal Disease Father   . Dementia Mother   . Heart attack Mother   . Diabetes Sister   . Heart disease Sister   . Diabetes Maternal Aunt   . Diabetes Maternal Uncle   . Diabetes Paternal Aunt   . Diabetes Paternal Uncle   . Heart disease Unknown   . Heart attack Brother   . Lung cancer Sister   . Renal Disease Brother     Social History: Social History   Socioeconomic History  . Marital status: Married    Spouse name: Elisha Headland  . Number of children: 3  . Years of education: Not on file  . Highest education level: Associate degree: academic program  Occupational History  . Occupation: disabled    Employer: DISABLED  Social Needs  . Financial resource strain: Not on file  . Food insecurity    Worry:  Not on file    Inability: Not on file  . Transportation needs    Medical: Not on file    Non-medical: Not on file  Tobacco Use  . Smoking status: Never Smoker  . Smokeless tobacco: Never Used  Substance and Sexual Activity  . Alcohol use: No  . Drug use: No  . Sexual activity: Not Currently  Lifestyle  . Physical activity    Days per week: Not on file    Minutes per session: Not on file  . Stress: Not on file  Relationships  . Social Herbalist on phone: Not on file    Gets together: Not on file    Attends religious service: Not on file    Active member of club or organization: Not on file    Attends meetings of clubs or organizations: Not on file    Relationship status: Not on file  . Intimate partner violence    Fear of current or ex partner: Not on file    Emotionally abused: Not on file    Physically abused: Not on file    Forced sexual activity: Not on file  Other Topics Concern  . Not on file  Social History Narrative   Married 2463. 23 year old son in 87. 1 granddaughter from [place].       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 He lives with his wife. He drinks 3-4 cups of coffee a day. He walks most every day.    Observations/Objective:   There were no vitals taken for this visit. No acute distress.  Alert and oriented.  Speech fluent and not dysarthric.  Language intact.  Assessment and Plan:   1.  Diabetic polyneuropathy 2.  Chronic low back pain 3.  Cervicalgia 4.  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  Suspect secondary to gabapentin.  However, he defers tapering off at this time because it does help with his neuropathic pain.  1. Restart nortriptyline but at 25mg  at bedtime.  He will  contact me in 6 weeks with update (or sooner if experiencing adverse effects) 2.  Continue gabapentin  600mg /300mg /900mg .  Plan would be to taper down/off eventually 3.  Follow up in 4 months.  Follow Up Instructions:    -I discussed the assessment and treatment plan with the patient. The patient was provided an opportunity to ask questions and all were answered. The patient agreed with the plan and demonstrated an understanding of the instructions.   The patient was advised to call back or seek an in-person evaluation if the symptoms worsen or if the condition fails to improve as anticipated.    Total Time spent in visit with the patient was:  12 minutes.   Dudley Major, DO

## 2019-02-20 ENCOUNTER — Encounter: Payer: Self-pay | Admitting: Neurology

## 2019-02-20 ENCOUNTER — Other Ambulatory Visit: Payer: Self-pay

## 2019-02-20 ENCOUNTER — Telehealth (INDEPENDENT_AMBULATORY_CARE_PROVIDER_SITE_OTHER): Payer: Medicare Other | Admitting: Neurology

## 2019-02-20 DIAGNOSIS — E1142 Type 2 diabetes mellitus with diabetic polyneuropathy: Secondary | ICD-10-CM | POA: Diagnosis not present

## 2019-02-20 DIAGNOSIS — G253 Myoclonus: Secondary | ICD-10-CM

## 2019-02-20 DIAGNOSIS — E0843 Diabetes mellitus due to underlying condition with diabetic autonomic (poly)neuropathy: Secondary | ICD-10-CM

## 2019-02-20 DIAGNOSIS — Z794 Long term (current) use of insulin: Secondary | ICD-10-CM

## 2019-02-20 DIAGNOSIS — M5416 Radiculopathy, lumbar region: Secondary | ICD-10-CM

## 2019-02-20 MED ORDER — NORTRIPTYLINE HCL 25 MG PO CAPS: 25.0000 mg | ORAL_CAPSULE | Freq: Every day | ORAL | 3 refills | Status: DC

## 2019-02-27 DIAGNOSIS — Z940 Kidney transplant status: Secondary | ICD-10-CM | POA: Diagnosis not present

## 2019-02-27 DIAGNOSIS — Z1159 Encounter for screening for other viral diseases: Secondary | ICD-10-CM | POA: Diagnosis not present

## 2019-02-27 DIAGNOSIS — E1129 Type 2 diabetes mellitus with other diabetic kidney complication: Secondary | ICD-10-CM | POA: Diagnosis not present

## 2019-02-27 DIAGNOSIS — N2581 Secondary hyperparathyroidism of renal origin: Secondary | ICD-10-CM | POA: Diagnosis not present

## 2019-02-27 DIAGNOSIS — I129 Hypertensive chronic kidney disease with stage 1 through stage 4 chronic kidney disease, or unspecified chronic kidney disease: Secondary | ICD-10-CM | POA: Diagnosis not present

## 2019-02-28 ENCOUNTER — Telehealth: Payer: Self-pay | Admitting: Family Medicine

## 2019-02-28 NOTE — Telephone Encounter (Signed)
Patient called in to get a referral from Dr Yong Channel for the urologist  appt . He would like to schedule an appt within [place].

## 2019-02-28 NOTE — Telephone Encounter (Signed)
See note. Colletta [place] can process referral once placed.

## 2019-02-28 NOTE — Telephone Encounter (Signed)
May place referral to [facility] under elevated PSA-I would place this as a urgent and place PSA of 30 in the comments

## 2019-02-28 NOTE — Telephone Encounter (Signed)
PT states he had test done at the [facility] yesterday and his PSA was extremely high around 30. His prior urologist Dr.Grapey is no longer in practice. Pt states he is having the lab results sent to [place]. Ok to place referral or should  the [place] handle this?

## 2019-03-01 ENCOUNTER — Other Ambulatory Visit: Payer: Self-pay

## 2019-03-01 ENCOUNTER — Telehealth: Payer: Self-pay | Admitting: Family Medicine

## 2019-03-01 DIAGNOSIS — R972 Elevated prostate specific antigen [PSA]: Secondary | ICD-10-CM

## 2019-03-01 MED ORDER — OMEPRAZOLE 20 MG PO CPDR: 20.0000 mg | DELAYED_RELEASE_CAPSULE | Freq: Two times a day (BID) | ORAL | 3 refills | Status: DC

## 2019-03-01 NOTE — Telephone Encounter (Signed)
He would like a refill of his omeprazole 20 mg capsules - called into [facility].  [facility] and they have not [facility] out to him about it.  He is completely out and would appreciate if we could refill it for him.

## 2019-03-01 NOTE — Telephone Encounter (Signed)
Medication sent in. 

## 2019-03-01 NOTE — Telephone Encounter (Signed)
Referral placed and pt notified

## 2019-03-06 ENCOUNTER — Telehealth: Payer: Self-pay | Admitting: Family Medicine

## 2019-03-06 DIAGNOSIS — R3915 Urgency of urination: Secondary | ICD-10-CM | POA: Diagnosis not present

## 2019-03-06 DIAGNOSIS — D49511 Neoplasm of unspecified behavior of right kidney: Secondary | ICD-10-CM | POA: Diagnosis not present

## 2019-03-06 NOTE — Telephone Encounter (Signed)
Copied from [place] [id number]. Topic: General - Other >> Mar 06, 2019 10:08 AM Pauline Good wrote: Reason for CRM: pt need letter stating he is ok to start taking online classes again. Pt will come and pick it up when ready. Please call pt

## 2019-03-06 NOTE — Telephone Encounter (Signed)
Ok to write letter

## 2019-03-06 NOTE — Telephone Encounter (Signed)
Yes thanks  To whom it may concern,  Patient may resume online classes only at this time.  Thanks, Garret Reddish

## 2019-03-06 NOTE — Telephone Encounter (Signed)
Pt notified note is ready.

## 2019-03-07 ENCOUNTER — Other Ambulatory Visit: Payer: Self-pay | Admitting: Urology

## 2019-03-07 ENCOUNTER — Other Ambulatory Visit (HOSPITAL_COMMUNITY): Payer: Self-pay | Admitting: Urology

## 2019-03-07 ENCOUNTER — Telehealth: Payer: Self-pay

## 2019-03-07 DIAGNOSIS — D49511 Neoplasm of unspecified behavior of right kidney: Secondary | ICD-10-CM

## 2019-03-07 NOTE — Telephone Encounter (Signed)
   Primary Cardiologist:Brian Stanford Breed, MD  Chart reviewed as part of pre-operative protocol coverage. Because of Oaklyn Busko Ruggieri's past medical history and time since last visit, he/she will require a follow-up visit in order to better assess preoperative cardiovascular risk.  Pre-op covering staff: - Please schedule appointment and call patient to inform them. - Please contact requesting surgeon's office via preferred method (i.e, phone, fax) to inform them of need for appointment prior to surgery.  If applicable, this message will also be routed to pharmacy pool and/or primary cardiologist for input on holding anticoagulant/antiplatelet agent as requested below so that this information is available at time of patient's appointment.   [place], [place]  03/07/2019, 2:53 PM

## 2019-03-07 NOTE — Telephone Encounter (Addendum)
Left a detailed message for the patient on his mobile and home numbers about getting shceduled for an appointment for pre op clearance and to give our office a call back and someone will assist him.

## 2019-03-07 NOTE — Telephone Encounter (Signed)
Request for surgical clearance:  1. What type of surgery is being performed?  PROSTATE BIOPSY   2. When is this surgery scheduled? 05-05-2019  3. What type of clearance is required (medical clearance vs. Pharmacy clearance to hold med vs. Both)? BOTH  4. Are there any medications that need to be held prior to surgery and how long?  PLAVIX   5. Practice name and name of physician performing surgery? [facility]   6. What is your office phone number [phone]    7.   What is your office fax number  [phone]  8.   Anesthesia type (None, local, MAC, general) ? NOT LISTED

## 2019-03-08 NOTE — Telephone Encounter (Signed)
Patient is scheduled for an appointment with Jory Sims on 03/27/19 at 3:30PM.

## 2019-03-14 ENCOUNTER — Ambulatory Visit (HOSPITAL_COMMUNITY): Payer: Medicare Other

## 2019-03-14 ENCOUNTER — Encounter (HOSPITAL_COMMUNITY): Payer: Self-pay

## 2019-03-17 ENCOUNTER — Other Ambulatory Visit: Payer: Self-pay

## 2019-03-17 ENCOUNTER — Ambulatory Visit (INDEPENDENT_AMBULATORY_CARE_PROVIDER_SITE_OTHER): Payer: Medicare Other | Admitting: Podiatry

## 2019-03-17 ENCOUNTER — Encounter: Payer: Self-pay | Admitting: Podiatry

## 2019-03-17 DIAGNOSIS — M79676 Pain in unspecified toe(s): Secondary | ICD-10-CM

## 2019-03-17 DIAGNOSIS — D689 Coagulation defect, unspecified: Secondary | ICD-10-CM

## 2019-03-17 DIAGNOSIS — B351 Tinea unguium: Secondary | ICD-10-CM

## 2019-03-17 DIAGNOSIS — E1159 Type 2 diabetes mellitus with other circulatory complications: Secondary | ICD-10-CM

## 2019-03-17 NOTE — Progress Notes (Signed)
Complaint:  Visit Type: Patient returns to my office for continued preventative foot care services. Complaint: Patient states" my nails have grown long and thick and become painful to walk and wear shoes" Patient has been diagnosed with DM with vascular disease.. The patient presents for preventative foot care services. No changes to ROS.  Patient is taking plavix.  Podiatric Exam: Vascular: dorsalis pedis and posterior tibial pulses are not  palpable bilateral. Capillary return is immediate. Temperature gradient is WNL. Skin turgor WNL  Sensorium: Normal Semmes Weinstein monofilament test. Normal tactile sensation bilaterally. Nail Exam: Pt has thick disfigured discolored nails with subungual debris noted bilateral entire nail hallux through fifth toenails.   Ulcer Exam: There is no evidence of ulcer or pre-ulcerative changes or infection. Orthopedic Exam: Muscle tone and strength are WNL. No limitations in general ROM. No crepitus or effusions noted. Foot type and digits show no abnormalities. Bony prominences are unremarkable. Skin: No Porokeratosis. No infection or ulcers.    Diagnosis:  Onychomycosis, , Pain in right toe, pain in left toes    Treatment & Plan Procedures and Treatment: Consent by patient was obtained for treatment procedures. The patient understood the discussion of treatment and procedures well. All questions were answered thoroughly reviewed. Debridement of mycotic and hypertrophic toenails, 1 through 5 bilateral and clearing of subungual debris. No ulceration, no infection noted. Return Visit-Office Procedure: Patient instructed to return to the office for a follow up visit 3 months for continued evaluation and treatment.    Gardiner Barefoot DPM

## 2019-03-22 ENCOUNTER — Ambulatory Visit (HOSPITAL_COMMUNITY)
Admission: RE | Admit: 2019-03-22 | Discharge: 2019-03-22 | Disposition: A | Discharge status: Home / Self Care | Payer: Medicare Other | Source: Ambulatory Visit | Attending: Urology | Admitting: Urology

## 2019-03-22 ENCOUNTER — Other Ambulatory Visit: Payer: Self-pay

## 2019-03-22 DIAGNOSIS — D49511 Neoplasm of unspecified behavior of right kidney: Secondary | ICD-10-CM | POA: Diagnosis not present

## 2019-03-22 DIAGNOSIS — N2889 Other specified disorders of kidney and ureter: Secondary | ICD-10-CM | POA: Diagnosis not present

## 2019-03-27 ENCOUNTER — Ambulatory Visit: Payer: Medicare Other | Admitting: Adult Health

## 2019-03-27 NOTE — Progress Notes (Deleted)
Cardiology Office Note   Date:  03/27/2019   ID:  [name], DOB [birth year], MRN [id number]  PCP:  [name], MD  Cardiologist:  Lubertha South  No chief complaint on file.    History of Present Illness: [name] is a 68 y.o. 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 OSA on CPAP,   Admitted 6/30-11/23/2017 for chest pain, Myoview was abnormal, patient prefers medical therapy versus cath, left lower extremity with significant PAD 7/30 office visit with Dr. Donnetta Hutching, PAD not felt limb threatening and no clear claudication symptoms, medical therapy    Past Medical History:  Diagnosis Date  . Allergy   . Angina   . Arthritis   . Backache, unspecified   . CHF (congestive heart failure) ([place])   . Chills   . Chronic kidney disease, stage III (moderate)    HD T- TH-SAT  . Complication of anesthesia    01/2011 could not eat,, hospt x2, was placed on hd and cleared up  . Coronary atherosclerosis of native coronary artery   . Diabetes mellitus 2004  . Dizziness   . Dysphagia, unspecified(787.20)   . Gastroparesis   . Headache(784.0)   . Hemorrhage of rectum and anus   . Hyperlipidemia   . Hypertrophy of prostate with urinary obstruction and other lower urinary tract symptoms (LUTS)   . INTERNAL HEMORRHOIDS 10/16/2008   Qualifier: Diagnosis of  By: Nolon Rod CMA (AAMA), Robin    . Internal hemorrhoids without mention of complication   . Myocardial infarction ([place]) 2002  . Nausea alone   . Obesity   . Other dyspnea and respiratory abnormality   . Other malaise and fatigue   . Personal history of unspecified circulatory disease   . Posttraumatic stress disorder   . Sleep apnea    USES CPAP   . Stroke [facility])    2007  . Unspecified essential hypertension    hx htn     Past Surgical History:  Procedure Laterality Date  . ANAL FISSURECTOMY    . AV FISTULA  PLACEMENT    . CARDIAC CATHETERIZATION     2005 DR BRODIE  . HEMORRHOID SURGERY    . revision of fistula     renal failure  . VENTRAL HERNIA REPAIR  07/01/2011   Procedure: HERNIA REPAIR VENTRAL ADULT;  Surgeon: Joyice Faster. Cornett, MD;  Location: [facility];  Service: General;  Laterality: N/A;     Current Outpatient Medications  Medication Sig Dispense Refill  . acetaminophen (TYLENOL) 500 MG tablet Take 1,000 mg by mouth as needed for mild pain or headache.     . allopurinol (ZYLOPRIM) 100 MG tablet Take 100 mg by mouth 2 (two) times daily.     [name] amLODipine (NORVASC) 10 MG tablet Take 10 mg by mouth daily.    [name] aspirin 81 MG tablet Take 81 mg by mouth at bedtime.     [name] atorvastatin (LIPITOR) 40 MG tablet Take 40 mg by mouth daily.    . clopidogrel (PLAVIX) 75 MG tablet Take 75 mg by mouth daily.     . Continuous Blood Gluc Receiver (FREESTYLE LIBRE 14 DAY READER) DEVI 1 Device by Does not apply route daily. 1 Device 0  . Continuous Blood Gluc Sensor (FREESTYLE LIBRE 14 DAY SENSOR) MISC Place 1 Units onto the skin every 14 (fourteen) days. 3 each 11  .  cyclobenzaprine (FLEXERIL) 5 MG tablet Take 0.5-1 tablets (2.5-5 mg total) 3 (three) times daily as needed by mouth for muscle spasms. (Patient not taking: Reported on 09/21/2018) 30 tablet 0  . diazepam (VALIUM) 5 MG tablet Take 1 tablet (5 mg total) by mouth at bedtime. (Patient not taking: Reported on 01/16/2019) 30 tablet 0  . furosemide (LASIX) 40 MG tablet Take 1 tablet (40 mg total) by mouth daily. (Patient taking differently: Take 20 mg by mouth daily. ) 30 tablet   . gabapentin (NEURONTIN) 300 MG capsule Take 2 capsules in AM, 1 capsule at noon and 3 capsules at bedtime (Patient taking differently: Take 3 capsules in AM, 3 capsule at noon and 3 capsules at bedtime) 180 capsule 5  . glucose blood (ACCU-CHEK AVIVA PLUS) test strip Use when checking blood sugar three times daily. E08.43, Z79.4, E11.42 300 strip 1  . insulin glargine (LANTUS) 100  UNIT/ML injection Inject 75 Units into the skin 2 (two) times daily.    . Insulin Syringe-Needle U-100 (B-D INS SYR ULTRAFINE 1CC/31G) 31G X 5/16" 1 ML MISC Used to give insulin injections twice daily. 100 each 11  . Magnesium 200 MG TABS Take 200 mg by mouth daily with lunch.     . mycophenolate (MYFORTIC) 180 MG EC tablet Take 180 mg by mouth 2 (two) times daily.    . nitroGLYCERIN (NITROSTAT) 0.4 MG SL tablet PLACE 1 TABLET (0.4 MG TOTAL) UNDER THE TONGUE EVERY 5 (FIVE) MINUTES AS NEEDED FOR CHEST PAIN. 25 tablet 2  . nortriptyline (PAMELOR) 25 MG capsule Take 1 capsule (25 mg total) by mouth at bedtime. 30 capsule 3  . omeprazole (PRILOSEC) 20 MG capsule Take 1 capsule (20 mg total) by mouth 2 (two) times daily before a meal. 180 capsule 3  . Semaglutide,0.25 or 0.5MG /DOS, (OZEMPIC, 0.25 OR 0.5 MG/DOSE,) 2 MG/1.5ML SOPN Inject into the skin once a week.    . tacrolimus (PROGRAF) 1 MG capsule 5 capsules 2 times daily    . Tacrolimus 1 MG CP24 Take 4 mg by mouth 2 (two) times daily.      No current facility-administered medications for this visit.     Allergies:   Patient has no known allergies.    Social History:  The patient  reports that he has never smoked. He has never used smokeless tobacco. He reports that he does not drink alcohol or use drugs.   Family History:  The patient's family history includes Dementia in his mother; Diabetes in his maternal aunt, maternal uncle, paternal aunt, paternal uncle, and sister; Heart attack in his brother and mother; Heart disease in his father, sister, and unknown relative; Lung cancer in his sister; Renal Disease in his brother and father.    ROS: All other systems are reviewed and negative. Unless otherwise mentioned in H&P    PHYSICAL EXAM: VS:  There were no vitals taken for this visit. , BMI There is no height or weight on file to calculate BMI. GEN: Well nourished, well developed, in no acute distress HEENT: normal Neck: no JVD, carotid  bruits, or masses Cardiac: ***RRR; no murmurs, rubs, or gallops,no edema  Respiratory:  Clear to auscultation bilaterally, normal work of breathing GI: soft, nontender, nondistended, + BS MS: no deformity or atrophy Skin: warm and dry, no rash Neuro:  Strength and sensation are intact Psych: euthymic mood, full affect   EKG:  EKG {ACTION; IS/IS [id number] ordered today. The ekg ordered today demonstrates ***   Recent Labs:  01/16/2019: ALT 17; BUN 17; Creatinine, Ser 1.64; Hemoglobin 14.2; Platelets 261.0; Potassium 3.8; Sodium 134    Lipid Panel    Component Value Date/Time   CHOL 128 06/03/2018 1507   TRIG 199 (H) 06/03/2018 1507   TRIG 373 (HH) 04/27/2006 1123   HDL 35 (L) 06/03/2018 1507   CHOLHDL 3.7 06/03/2018 1507   VLDL 44.6 (H) 07/29/2011 1322   LDLCALC 66 06/03/2018 1507   LDLDIRECT 75.0 01/16/2019 1154      Wt Readings from Last 3 Encounters:  01/16/19 257 lb 6.4 oz (116.8 kg)  10/14/18 244 lb (110.7 kg)  09/30/18 244 lb (110.7 kg)      Other studies Reviewed: MYOVIEW: 11/23/2017 FINDINGS: Perfusion: There is decreased activity in the inferolateral wall on stress images concerning for inducible ischemia. No fixed defect to suggest infarct.  Wall Motion: Normal left ventricular wall motion. No left ventricular dilation.  Left Ventricular Ejection Fraction: 54 %  End diastolic volume 123XX123 ml  End systolic volume 50 ml  IMPRESSION: 1. Moderate-sized area of decreased activity on stress images in the inferolateral wall concerning for ischemia.  2. Normal left ventricular wall motion.  3. Left ventricular ejection fraction 54%  4. Non invasive risk stratification*: Intermediate  ECHO: 11/22/2017 - Left ventricle: The cavity size was normal. Wall thickness was increased in a pattern of severe LVH. Systolic function was normal. The estimated ejection fraction was in the range of 60% to 65%. Wall motion was normal; there were no  regional wall motion abnormalities. Features are consistent with a pseudonormal left ventricular filling pattern, with concomitant abnormal relaxation and increased filling pressure (grade 2 diastolic dysfunction). - Aortic valve: There was trivial regurgitation.  LLE Doppler: 12/21/2017 Final Interpretation: Left: Two areas of 50-74% stenosis in the proximal and proximal-mid SFA. The left distal ATA is severely diminished.  ASSESSMENT AND PLAN:  1.  ***   Current medicines are reviewed at length with the patient today.    Labs/ tests ordered today include: *** Phill Myron. [place] Pugh, ANP, AACC   03/27/2019 7:45 AM    [facility] [address] Office [phone] Fax [phone]  Notice: This dictation was prepared with Dragon dictation along with smaller phrase technology. Any transcriptional errors that result from this process are unintentional and may not be corrected upon review.

## 2019-03-28 ENCOUNTER — Other Ambulatory Visit: Payer: Self-pay

## 2019-03-28 ENCOUNTER — Encounter: Payer: Self-pay | Admitting: Physician Assistant

## 2019-03-28 ENCOUNTER — Ambulatory Visit (INDEPENDENT_AMBULATORY_CARE_PROVIDER_SITE_OTHER): Payer: Medicare Other | Admitting: Physician Assistant

## 2019-03-28 VITALS — BP 118/70 | HR 86 | Temp 97.5°F | Ht 70.5 in | Wt 251.6 lb

## 2019-03-28 DIAGNOSIS — I2510 Atherosclerotic heart disease of native coronary artery without angina pectoris: Secondary | ICD-10-CM

## 2019-03-28 DIAGNOSIS — I712 Thoracic aortic aneurysm, without rupture, unspecified: Secondary | ICD-10-CM

## 2019-03-28 DIAGNOSIS — Z8673 Personal history of transient ischemic attack (TIA), and cerebral infarction without residual deficits: Secondary | ICD-10-CM | POA: Diagnosis not present

## 2019-03-28 DIAGNOSIS — E119 Type 2 diabetes mellitus without complications: Secondary | ICD-10-CM | POA: Diagnosis not present

## 2019-03-28 DIAGNOSIS — Z01810 Encounter for preprocedural cardiovascular examination: Secondary | ICD-10-CM

## 2019-03-28 DIAGNOSIS — Z940 Kidney transplant status: Secondary | ICD-10-CM

## 2019-03-28 DIAGNOSIS — I739 Peripheral vascular disease, unspecified: Secondary | ICD-10-CM

## 2019-03-28 NOTE — Patient Instructions (Addendum)
Medication Instructions:   HOLD PLAVIX FOR 7 DAYS PRIOR TO PROCEDURE AND RESTART AS SOON AS POSSIBLE AFTERWARDS AT THE DISCRETION OF THE SURGEON  *If you need a refill on your cardiac medications before your next appointment, please call your pharmacy*  Lab Work:  NONE ordered at this time of appointment   If you have labs (blood work) drawn today and your tests are completely normal, you will receive your results only by: [name] MyChart Message (if you have MyChart) OR . A paper copy in the mail If you have any lab test that is abnormal or we need to change your treatment, we will call you to review the results.  Testing/Procedures:  Your Physician has ordered a Chest CT This will be done at [place]: At [facility], you and your health needs are our priority.  As part of our continuing mission to provide you with exceptional heart care, we have created designated Provider Care Teams.  These Care Teams include your primary Cardiologist (physician) and Advanced Practice Providers (APPs -  Physician Assistants and Nurse Practitioners) who all work together to provide you with the care you need, when you need it.  Your next appointment:   6 months  The format for your next appointment:   In Person  Provider:   Kirk Ruths, MD  Other Instructions

## 2019-03-28 NOTE — Progress Notes (Signed)
Cardiology Office Note    Date:  03/30/2019   ID:  [name], DOB March 05, 1951, MRN [id number]  PCP:  [name], MD  Cardiologist: Dr. Stanford Breed  Chief Complaint  Patient presents with   Pre-op Exam    pending prostate biopsy requested by urology    History of Present Illness:  [name] is a 68 y.o. male with past medical history of CVA, DM 2, CKD with renal transplant in 2016, left upper extremity AV fistula, PAD, thoracic aortic aneurysm, and CAD with DES to OM in 2014.   He was admitted in June 2019 with chest pain.  Myoview was abnormal, however patient prefers medical therapy versus cath.  Ischemia on case was presented in the inferolateral wall, however patient has a known 95% stenosis in the small nondominant RCA from previous cardiac cath in 2014 that was treated medically.  ABI obtained in 2019 was abnormal and the patient was subsequently referred to Dr. Donnetta Hutching for evaluation.  According to vascular surgery note, his symptom was not consistent with claudication, therefore no further work-up was recommended.  No lower extremity angiography was recommended which may increase the risk to his transplanted kidney with minimal benefit.  Last CT of chest obtained in early 2019 at [facility] showed a stable thoracic aortic aneurysm measuring at 4.7 cm.  He has not had any further work-up since.  He was last seen by Rosaria Ferries, PA-C on 12/28/2017 at which time he was doing well.  Patient presents today for cardiology office visit.  He has been doing very well.  He walks 1 day a week up to 1 mile.  He denies any exertional chest pain or lower extremity weakness/pain with exertion.  He has no increasing shortness of breath.  He wants cardiology service to follow-up on his thoracic aortic aneurysm.  The last image study was in early 2019.  I recommended CT of the chest without contrast every 6 months to follow-up on the progression.  Otherwise given the fact that he is able to  accomplish at least 4 METS of activity without any exertional symptoms, he is cleared to proceed with a prostate biopsy.  He may hold Plavix for 7 days prior to the procedure and restart as soon as possible after the procedure.  Previously, we were seeing the patient once every year, however he wished to be followed by cardiology service more closely.  I recommended follow-up every 6 months instead.  His blood pressure is very well controlled on the current therapy.   Past Medical History:  Diagnosis Date   Allergy    Angina    Arthritis    Backache, unspecified    CHF (congestive heart failure) (HCC)    Chills    Chronic kidney disease, stage III (moderate)    HD T- TH-SAT   Complication of anesthesia    01/2011 could not eat,, hospt x2, was placed on hd and cleared up   Coronary atherosclerosis of native coronary artery    Diabetes mellitus 2004   Dizziness    Dysphagia, unspecified(787.20)    Gastroparesis    Headache(784.0)    Hemorrhage of rectum and anus    Hyperlipidemia    Hypertrophy of prostate with urinary obstruction and other lower urinary tract symptoms (LUTS)    INTERNAL HEMORRHOIDS 10/16/2008   Qualifier: Diagnosis of  By: Nolon Rod CMA (AAMA), Robin     Internal hemorrhoids without mention of complication    Myocardial infarction [facility]) 2002  Nausea alone    Obesity    Other dyspnea and respiratory abnormality    Other malaise and fatigue    Personal history of unspecified circulatory disease    Posttraumatic stress disorder    Sleep apnea    USES CPAP    Stroke [facility])    2007   Unspecified essential hypertension    hx htn     Past Surgical History:  Procedure Laterality Date   ANAL FISSURECTOMY     AV FISTULA PLACEMENT     CARDIAC CATHETERIZATION     2005 DR BRODIE   HEMORRHOID SURGERY     revision of fistula     renal failure   VENTRAL HERNIA REPAIR  07/01/2011   Procedure: HERNIA REPAIR VENTRAL ADULT;  Surgeon:  Joyice Faster. Cornett, MD;  Location: [place] OR;  Service: General;  Laterality: N/A;    Current Medications: Outpatient Medications Prior to Visit  Medication Sig Dispense Refill   acetaminophen (TYLENOL) 500 MG tablet Take 1,000 mg by mouth as needed for mild pain or headache.      allopurinol (ZYLOPRIM) 100 MG tablet Take 100 mg by mouth 2 (two) times daily.      amLODipine (NORVASC) 10 MG tablet Take 10 mg by mouth daily.     aspirin 81 MG tablet Take 81 mg by mouth at bedtime.      atorvastatin (LIPITOR) 40 MG tablet Take 40 mg by mouth daily.     clopidogrel (PLAVIX) 75 MG tablet Take 75 mg by mouth daily.      diazepam (VALIUM) 5 MG tablet Take 1 tablet (5 mg total) by mouth at bedtime. 30 tablet 0   furosemide (LASIX) 40 MG tablet Take 1 tablet (40 mg total) by mouth daily. (Patient taking differently: Take 20 mg by mouth daily. ) 30 tablet    gabapentin (NEURONTIN) 300 MG capsule Take 2 capsules in AM, 1 capsule at noon and 3 capsules at bedtime (Patient taking differently: Take 3 capsules in AM, 3 capsule at noon and 3 capsules at bedtime) 180 capsule 5   glucose blood (ACCU-CHEK AVIVA PLUS) test strip Use when checking blood sugar three times daily. E08.43, Z79.4, E11.42 300 strip 1   HYDROcodone-acetaminophen (NORCO/VICODIN) 5-325 MG tablet      insulin glargine (LANTUS) 100 UNIT/ML injection Inject 75 Units into the skin 2 (two) times daily.     Insulin Syringe-Needle U-100 (B-D INS SYR ULTRAFINE 1CC/31G) 31G X 5/16" 1 ML MISC Used to give insulin injections twice daily. 100 each 11   levofloxacin (LEVAQUIN) 750 MG tablet Take 750 mg by mouth daily.     Magnesium 200 MG TABS Take 200 mg by mouth daily with lunch.      mycophenolate (MYFORTIC) 180 MG EC tablet Take 180 mg by mouth 2 (two) times daily.     nitroGLYCERIN (NITROSTAT) 0.4 MG SL tablet PLACE 1 TABLET (0.4 MG TOTAL) UNDER THE TONGUE EVERY 5 (FIVE) MINUTES AS NEEDED FOR CHEST PAIN. 25 tablet 2   nortriptyline  (PAMELOR) 25 MG capsule Take 1 capsule (25 mg total) by mouth at bedtime. 30 capsule 3   omeprazole (PRILOSEC) 20 MG capsule Take 1 capsule (20 mg total) by mouth 2 (two) times daily before a meal. 180 capsule 3   Semaglutide,0.25 or 0.5MG /DOS, (OZEMPIC, 0.25 OR 0.5 MG/DOSE,) 2 MG/1.5ML SOPN Inject into the skin once a week.     tacrolimus (PROGRAF) 1 MG capsule 5 capsules 2 times daily     Tacrolimus  1 MG CP24 Take 4 mg by mouth 2 (two) times daily.      Continuous Blood Gluc Receiver (FREESTYLE LIBRE 14 DAY READER) DEVI 1 Device by Does not apply route daily. 1 Device 0   Continuous Blood Gluc Sensor (FREESTYLE LIBRE 14 DAY SENSOR) MISC Place 1 Units onto the skin every 14 (fourteen) days. 3 each 11   cyclobenzaprine (FLEXERIL) 5 MG tablet Take 0.5-1 tablets (2.5-5 mg total) 3 (three) times daily as needed by mouth for muscle spasms. (Patient not taking: Reported on 09/21/2018) 30 tablet 0   No facility-administered medications prior to visit.      Allergies:   Patient has no known allergies.   Social History   Socioeconomic History   Marital status: Married    Spouse name: Manufacturing engineer   Number of children: 3   Years of education: Not on file   Highest education level: Associate degree: academic program  Occupational History   Occupation: disabled    Fish farm manager: DISABLED  Social Designer, fashion/clothing strain: Not on file   Food insecurity    Worry: Not on file    Inability: Not on file   Transportation needs    Medical: Not on file    Non-medical: Not on file  Tobacco Use   Smoking status: Never Smoker   Smokeless tobacco: Never Used  Substance and Sexual Activity   Alcohol use: No   Drug use: No   Sexual activity: Not Currently  Lifestyle   Physical activity    Days per week: Not on file    Minutes per session: Not on file   Stress: Not on file  Relationships   Social connections    Talks on phone: Not on file    Gets together: Not on file     Attends religious service: Not on file    Active member of club or organization: Not on file    Attends meetings of clubs or organizations: Not on file    Relationship status: Not on file  Other Topics Concern   Not on file  Social History Narrative   Married 5317. 61 year old son in 58. 1 granddaughter from [place].       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 He lives with his wife. He drinks 3-4 cups of coffee a day. He walks most every day.     Family History:  The patient's  family history includes Dementia in his mother; Diabetes in his maternal aunt, maternal uncle, paternal aunt, paternal uncle, and sister; Heart attack in his brother and mother; Heart disease in his father, sister, and unknown relative; Lung cancer in his sister; Renal Disease in his brother and father.   ROS:   Please see the history of present illness.    ROS All other systems reviewed and are negative.   PHYSICAL EXAM:   VS:  BP 118/70    Pulse 86    Temp (!) 97.5 F (36.4 C)    Ht 5' 10.5" (1.791 m)    Wt 251 lb 9.6 oz (114.1 kg)    SpO2 96%    BMI 35.59 kg/m    GEN: Well nourished, well developed, in no acute distress  HEENT: normal  Neck: no JVD, carotid bruits, or masses Cardiac: RRR; no murmurs, rubs, or gallops,no  edema  Respiratory:  clear to auscultation bilaterally, normal work of breathing GI: soft, nontender, nondistended, + BS MS: no deformity or atrophy  Skin: warm and dry, no rash Neuro:  Alert and Oriented x 3, Strength and sensation are intact Psych: euthymic mood, full affect  Wt Readings from Last 3 Encounters:  03/28/19 251 lb 9.6 oz (114.1 kg)  01/16/19 257 lb 6.4 oz (116.8 kg)  10/14/18 244 lb (110.7 kg)      Studies/Labs Reviewed:   EKG:  EKG is ordered today.  The ekg ordered today demonstrates NSR with poor R  wave progression in anterior leads, TWI in lateral leads  Recent Labs: 01/16/2019: ALT 17; BUN 17; Creatinine, Ser 1.64; Hemoglobin 14.2; Platelets 261.0; Potassium 3.8; Sodium 134   Lipid Panel    Component Value Date/Time   CHOL 128 06/03/2018 1507   TRIG 199 (H) 06/03/2018 1507   TRIG 373 (HH) 04/27/2006 1123   HDL 35 (L) 06/03/2018 1507   CHOLHDL 3.7 06/03/2018 1507   VLDL 44.6 (H) 07/29/2011 1322   LDLCALC 66 06/03/2018 1507   LDLDIRECT 75.0 01/16/2019 1154    Additional studies/ records that were reviewed today include:   Cath 01/17/2013 COMMENTS: LM 25% LAD mid 50%, D1 50% LCX OM1 75%, mid LCX 50% RCA non dom, 95%  PCI OM1 to 0%, 2.5x32 DES      CTA of chest 06/06/2015 IMPRESSION: 1. 4.6 cm ascending thoracic aortic aneurysm. Ascending thoracic aortic aneurysm. Recommend semi-annual imaging followup by CTA or MRA and referral to cardiothoracic surgery if not already obtained. This recommendation follows 2010 ACCF/AHA/AATS/ACR/ASA/SCA/SCAI/SIR/STS/SVM Guidelines for the Diagnosis and Management of Patients With Thoracic Aortic Disease. Circulation. 2010; 121: [id number] 2. No evidence for dissection. 3. Atherosclerotic disease as described without significant stenosis. 4. Coronary artery disease.    CT of Chest 06/09/2017 1. Ascending aortic aneurysm up to 4.7 cm in transverse diameter, similar dating back to 2016 when measured in the same manner/location. 2. Right thyroid nodule up to 2.5 cm in diameter. Suggest follow-up per recommendations from ultrasound on 01/28/2016.   Echo 11/22/2017 LV EF: 60% -   65% Study Conclusions  - Left ventricle: The cavity size was normal. Wall thickness was   increased in a pattern of severe LVH. Systolic function was   normal. The estimated ejection fraction was in the range of 60%   to 65%. Wall motion was normal; there were no regional wall   motion abnormalities. Features are consistent with a pseudonormal   left  ventricular filling pattern, with concomitant abnormal   relaxation and increased filling pressure (grade 2 diastolic   dysfunction). - Aortic valve: There was trivial regurgitation.    Myoview 11/23/2017 IMPRESSION: 1. Moderate-sized area of decreased activity on stress images in the inferolateral wall concerning for ischemia.  2. Normal left ventricular wall motion.  3. Left ventricular ejection fraction 54%  4. Non invasive risk stratification*: Intermediate   ASSESSMENT:    1. Preop cardiovascular exam   2. Thoracic aortic aneurysm without rupture ([place])   3. Controlled type 2 diabetes mellitus without complication, without long-term current use of insulin ([place])   4. H/O: CVA (cerebrovascular accident)   5. Renal transplant recipient   6. Coronary artery disease involving native coronary artery of native heart without angina pectoris   7. PAD (peripheral artery disease) (HCC)      PLAN:  In order of problems listed above:  1. Preoperative clearance: Patient can complete at least 4 METS of activity  without any issue.  He may proceed with his prostate biopsy after holding Plavix for 7 days.  He will need to restart Plavix as soon as possible afterward  2. Thoracic aortic aneurysm: He is overdue for repeat image.  I will obtain a CT of chest without contrast given his renal dysfunction.  He will need repeat imaging every 6 months.  3. DM2: Managed by primary care provider  4. History of CVA: No recurrence  5. History of CKD s/p renal transplant: Renal function stable  6. CAD: Denies any recent chest pain.  7. PAD: No claudication symptoms.     Medication Adjustments/Labs and Tests Ordered: Current medicines are reviewed at length with the patient today.  Concerns regarding medicines are outlined above.  Medication changes, Labs and Tests ordered today are listed in the Patient Instructions below. Patient Instructions  Medication Instructions:   HOLD PLAVIX FOR  7 DAYS PRIOR TO PROCEDURE AND RESTART AS SOON AS POSSIBLE AFTERWARDS AT THE DISCRETION OF THE SURGEON  *If you need a refill on your cardiac medications before your next appointment, please call your pharmacy*  Lab Work:  NONE ordered at this time of appointment   If you have labs (blood work) drawn today and your tests are completely normal, you will receive your results only by:  MyChart Message (if you have MyChart) OR  A paper copy in the mail If you have any lab test that is abnormal or we need to change your treatment, we will call you to review the results.  Testing/Procedures:  Your Physician has ordered a Chest CT This will be done at [place]: At [facility], you and your health needs are our priority.  As part of our continuing mission to provide you with exceptional heart care, we have created designated Provider Care Teams.  These Care Teams include your primary Cardiologist (physician) and Advanced Practice Providers (APPs -  Physician Assistants and Nurse Practitioners) who all work together to provide you with the care you need, when you need it.  Your next appointment:   6 months  The format for your next appointment:   In Person  Provider:   Kirk Ruths, MD  Other Instructions      Signed, Almyra Deforest, [place]  03/30/2019 11:23 PM    [address], [place], [place]  [address] Phone: [phone]; Fax: [phone]

## 2019-03-30 ENCOUNTER — Encounter: Payer: Self-pay | Admitting: Physician Assistant

## 2019-03-31 ENCOUNTER — Telehealth: Payer: Self-pay | Admitting: Physician Assistant

## 2019-03-31 NOTE — Telephone Encounter (Signed)
Fax Number [phone]      [facility] Pre-operative Risk Assessment    Request for surgical clearance:  1. What type of surgery is being performed? Prostate biopsy   2. When is this surgery scheduled? 05-05-19  3. What type of clearance is required (medical clearance vs. Pharmacy clearance to hold med vs. Both)? both  4. Are there any medications that need to be held prior to surgery and how long? Plavix , 7 days  5. Practice name and name of physician performing surgery? [facility]  6. What is your office phone number: [phone] x [phone]   7.   What is your office fax number: [phone]  8.   Anesthesia type (None, local, MAC, general) ?     [facility] still needs clearance from the office for this patient to have the procedure. Please fax clearance to the office.   [name] 03/31/2019, 10:20 AM  _________________________________________________________________   (provider comments below)

## 2019-03-31 NOTE — Telephone Encounter (Signed)
   Primary Cardiologist: Kirk Ruths, MD  Chart reviewed as part of pre-operative protocol coverage. Given past medical history and time since last visit, based on ACC/AHA guidelines, [name] would be at acceptable risk for the planned procedure without further cardiovascular testing.   Pt was seen by Almyra Deforest, PA-C on 03/28/2019 for pre-operative clearance. Patient can complete at least 4 METS of activity without any anginal symptoms. He may proceed with his prostate biopsy after holding Plavix for 7 days.  He will need to restart Plavix as soon as possible afterward  I will route this recommendation to the requesting party via Epic fax function and remove from pre-op pool.  Please call with questions.  Kathyrn Drown, NP 03/31/2019, 10:56 AM

## 2019-04-04 ENCOUNTER — Other Ambulatory Visit (INDEPENDENT_AMBULATORY_CARE_PROVIDER_SITE_OTHER): Payer: Medicare Other

## 2019-04-04 DIAGNOSIS — I739 Peripheral vascular disease, unspecified: Secondary | ICD-10-CM

## 2019-04-04 DIAGNOSIS — I10 Essential (primary) hypertension: Secondary | ICD-10-CM

## 2019-04-04 DIAGNOSIS — I2510 Atherosclerotic heart disease of native coronary artery without angina pectoris: Secondary | ICD-10-CM | POA: Diagnosis not present

## 2019-04-04 DIAGNOSIS — I951 Orthostatic hypotension: Secondary | ICD-10-CM

## 2019-04-04 DIAGNOSIS — Z01810 Encounter for preprocedural cardiovascular examination: Secondary | ICD-10-CM

## 2019-04-04 DIAGNOSIS — I25118 Atherosclerotic heart disease of native coronary artery with other forms of angina pectoris: Secondary | ICD-10-CM | POA: Diagnosis not present

## 2019-04-04 DIAGNOSIS — I712 Thoracic aortic aneurysm, without rupture, unspecified: Secondary | ICD-10-CM

## 2019-04-04 DIAGNOSIS — I77810 Thoracic aortic ectasia: Secondary | ICD-10-CM

## 2019-04-14 ENCOUNTER — Other Ambulatory Visit: Payer: Self-pay

## 2019-04-14 ENCOUNTER — Ambulatory Visit
Admission: RE | Admit: 2019-04-14 | Discharge: 2019-04-14 | Disposition: A | Discharge status: Home / Self Care | Payer: Medicare Other | Source: Ambulatory Visit | Attending: Physician Assistant | Admitting: Physician Assistant

## 2019-04-14 DIAGNOSIS — Z940 Kidney transplant status: Secondary | ICD-10-CM | POA: Diagnosis not present

## 2019-04-14 DIAGNOSIS — M109 Gout, unspecified: Secondary | ICD-10-CM | POA: Diagnosis not present

## 2019-04-14 DIAGNOSIS — I712 Thoracic aortic aneurysm, without rupture, unspecified: Secondary | ICD-10-CM

## 2019-04-14 DIAGNOSIS — I129 Hypertensive chronic kidney disease with stage 1 through stage 4 chronic kidney disease, or unspecified chronic kidney disease: Secondary | ICD-10-CM | POA: Diagnosis not present

## 2019-04-14 DIAGNOSIS — N2581 Secondary hyperparathyroidism of renal origin: Secondary | ICD-10-CM | POA: Diagnosis not present

## 2019-04-14 DIAGNOSIS — E785 Hyperlipidemia, unspecified: Secondary | ICD-10-CM | POA: Diagnosis not present

## 2019-04-18 ENCOUNTER — Other Ambulatory Visit: Payer: Self-pay

## 2019-04-18 DIAGNOSIS — I712 Thoracic aortic aneurysm, without rupture, unspecified: Secondary | ICD-10-CM

## 2019-04-18 NOTE — Progress Notes (Signed)
Order placed

## 2019-04-18 NOTE — Progress Notes (Signed)
Called patient and informed him of the comments from [facility], [place]. Patient verbalized an understanding.  Patient thanked me for calling back with the information to his question.  Jacqulynn Cadet, [place] 04/18/2019 5:06 PM

## 2019-04-18 NOTE — Progress Notes (Signed)
The patient has been notified of the result and verbalized understanding.  [name] asked if it is one of the stents that was placed that may have failed. He asked if you could give him a call he has some questions and concerns he would to discuss. Jacqulynn Cadet, [place] 04/18/2019 4:48 PM

## 2019-04-26 DIAGNOSIS — Z1159 Encounter for screening for other viral diseases: Secondary | ICD-10-CM | POA: Diagnosis not present

## 2019-04-26 DIAGNOSIS — N2581 Secondary hyperparathyroidism of renal origin: Secondary | ICD-10-CM | POA: Diagnosis not present

## 2019-04-26 DIAGNOSIS — I129 Hypertensive chronic kidney disease with stage 1 through stage 4 chronic kidney disease, or unspecified chronic kidney disease: Secondary | ICD-10-CM | POA: Diagnosis not present

## 2019-04-26 DIAGNOSIS — E559 Vitamin D deficiency, unspecified: Secondary | ICD-10-CM | POA: Diagnosis not present

## 2019-04-26 DIAGNOSIS — E1129 Type 2 diabetes mellitus with other diabetic kidney complication: Secondary | ICD-10-CM | POA: Diagnosis not present

## 2019-04-26 DIAGNOSIS — Z940 Kidney transplant status: Secondary | ICD-10-CM | POA: Diagnosis not present

## 2019-05-09 ENCOUNTER — Other Ambulatory Visit: Payer: Self-pay | Admitting: Family Medicine

## 2019-05-16 DIAGNOSIS — D49511 Neoplasm of unspecified behavior of right kidney: Secondary | ICD-10-CM | POA: Diagnosis not present

## 2019-05-17 ENCOUNTER — Other Ambulatory Visit: Payer: Self-pay | Admitting: Urology

## 2019-05-17 ENCOUNTER — Other Ambulatory Visit (HOSPITAL_COMMUNITY): Payer: Self-pay | Admitting: Urology

## 2019-05-17 DIAGNOSIS — C61 Malignant neoplasm of prostate: Secondary | ICD-10-CM

## 2019-05-29 DIAGNOSIS — Z940 Kidney transplant status: Secondary | ICD-10-CM | POA: Diagnosis not present

## 2019-05-29 DIAGNOSIS — I129 Hypertensive chronic kidney disease with stage 1 through stage 4 chronic kidney disease, or unspecified chronic kidney disease: Secondary | ICD-10-CM | POA: Diagnosis not present

## 2019-06-05 ENCOUNTER — Encounter: Payer: Self-pay | Admitting: Radiation Oncology

## 2019-06-05 NOTE — Progress Notes (Signed)
GU Location of Tumor / Histology: prostatic adenocarcinoma  If Prostate Cancer, Gleason Score is (4 + 3) and PSA is (25.60). Prostate volume: [place] reports that Dr. Risa Grill was monitoring his PSA. Patient thought he was taking medication to manage his PSA. Explains when Dr. Risa Grill left he loss contact with the clinic. The next PSA check was done at the [place] and found to be higher. [facility]. Patient reports when he returned to [facility] Dr. Lovena Neighbours found that his PSA was considerably higher thus a biopsy was done.   Biopsies of prostate (if applicable) revealed:   Past/Anticipated interventions by urology, if any: prostate biopsy, CT abd/pelvis, bone scan scheduled for 06/08/2019, referral for consideration of radiotherapy since patient isn't a good surgical candidate +/- ADT  Past/Anticipated interventions by medical oncology, if any: no  Weight changes, if any: denies  Bowel/Bladder complaints, if any: IPSS 2 with nocturia only. However, patient reports he doesn't sleep well due to chronic pain and simply voids when up. SHIM 10. Denies dysuria, hematuria, urinary leakage or incontinence. Denies any bowel complaints.   Nausea/Vomiting, if any: no  Pain issues, if any:  Reports chronic lumbar spine pain.  SAFETY ISSUES:  Prior radiation? denies  Pacemaker/ICD? Denies. Reports aortic valve is weak.  Possible current pregnancy? no, male patient  Is the patient on methotrexate? denies  Current Complaints / other details:  69 year old male. Renal transplant 2017.

## 2019-06-06 ENCOUNTER — Encounter: Payer: Self-pay | Admitting: Family Medicine

## 2019-06-06 ENCOUNTER — Ambulatory Visit
Admission: RE | Admit: 2019-06-06 | Discharge: 2019-06-06 | Disposition: A | Discharge status: Home / Self Care | Payer: Medicare Other | Source: Ambulatory Visit | Attending: Radiation Oncology | Admitting: Radiation Oncology

## 2019-06-06 ENCOUNTER — Encounter: Payer: Self-pay | Admitting: Radiation Oncology

## 2019-06-06 ENCOUNTER — Other Ambulatory Visit: Payer: Self-pay

## 2019-06-06 VITALS — Ht 70.5 in | Wt 252.0 lb

## 2019-06-06 DIAGNOSIS — Z940 Kidney transplant status: Secondary | ICD-10-CM | POA: Diagnosis not present

## 2019-06-06 DIAGNOSIS — N2889 Other specified disorders of kidney and ureter: Secondary | ICD-10-CM | POA: Diagnosis not present

## 2019-06-06 DIAGNOSIS — C61 Malignant neoplasm of prostate: Secondary | ICD-10-CM

## 2019-06-06 HISTORY — DX: Malignant neoplasm of prostate: C61

## 2019-06-06 NOTE — Progress Notes (Signed)
Radiation Oncology         [phone] ________________________________  Initial outpatient Consultation - Conducted via MyChart due to current COVID-19 concerns for limiting patient exposure  Name: [name] MRN: [id number]  Date: 06/06/2019  DOB: [birth year]  CV:ELFYBO, [name], MD  Davis Gourd*   REFERRING PHYSICIAN: Davis Gourd*  DIAGNOSIS: 69 y.o. gentleman with Stage T1c adenocarcinoma of the prostate with Gleason score of 4+3, and PSA of 25.6.    ICD-10-CM   1. Malignant neoplasm of prostate ([place])  C61     HISTORY OF PRESENT ILLNESS: [name] is a 69 y.o. male with a diagnosis of prostate cancer. He is also s/p renal transplant in 2017 at [facility] and is currently being monitored for right renal lesion. He has a history of dramatically fluctuating PSA for many years, going as high as 19.5 in 2013.  He had been followed previously with Dr. Risa Grill and the [facility] but never had previous prostate biopsy.  When Dr. Risa Grill retired, the patient lost contact with [facility].  More recently, he was found to have a PSA >30 at the [facility]. Accordingly, he was referred back to [facility] and met with Dr. Lovena Neighbours on 03/06/2019,  digital rectal examination was performed at that time revealing no nodules.  Repeat PSA performed on 03/07/19 remained elevated at 25.6.  The patient proceeded to transrectal ultrasound with 12 biopsies of the prostate on 05/05/2019.  The prostate volume measured 120 cc.  Out of 12 core biopsies, 7 were positive.  The maximum Gleason score was 4+3, and this was seen in the left base lateral (small focus), left mid, right mid, right apex, right base lateral (with perineural invasion), and right mid lateral. Gleason 3+4 was seen in the right apex lateral.  His most recent abdomen MRI for follow up of his renal lesion performed on 03/22/2019 showed no significant change in size of numerous small complex renal lesions in bilateral native  kidneys since 2014, an incompletely categorized 0.9 cm renal lesion in right lower quadrant renal transplant and no aggressive-appearing focal osseous lesions. He is scheduled for bone scan and CT A/P on 06/08/2019.  The patient reviewed the biopsy results with his urologist and he has kindly been referred today for discussion of potential radiation treatment options.  PREVIOUS RADIATION THERAPY: No  PAST MEDICAL HISTORY:  Past Medical History:  Diagnosis Date  . Allergy   . Angina   . Arthritis   . Backache, unspecified   . CHF (congestive heart failure) ([place])   . Chills   . Chronic kidney disease, stage III (moderate)    HD T- TH-SAT  . Complication of anesthesia    01/2011 could not eat,, hospt x2, was placed on hd and cleared up  . Coronary atherosclerosis of native coronary artery   . Diabetes mellitus 2004  . Dizziness   . Dysphagia, unspecified(787.20)   . Gastroparesis   . Headache(784.0)   . Hemorrhage of rectum and anus   . Hyperlipidemia   . Hypertrophy of prostate with urinary obstruction and other lower urinary tract symptoms (LUTS)   . INTERNAL HEMORRHOIDS 10/16/2008   Qualifier: Diagnosis of  By: Nolon Rod CMA (AAMA), Robin    . Internal hemorrhoids without mention of complication   . Myocardial infarction ([place]) 2002  . Nausea alone   . Obesity   . Other dyspnea and respiratory abnormality   . Other malaise and fatigue   . Personal history of unspecified circulatory  disease   . Posttraumatic stress disorder   . Prostate cancer ([place])   . Sleep apnea    USES CPAP   . Stroke [facility])    2007  . Unspecified essential hypertension    hx htn       PAST SURGICAL HISTORY: Past Surgical History:  Procedure Laterality Date  . ANAL FISSURECTOMY    . AV FISTULA PLACEMENT    . CARDIAC CATHETERIZATION     2005 DR BRODIE  . HEMORRHOID SURGERY    . PROSTATE BIOPSY    . revision of fistula     renal failure  . VENTRAL HERNIA REPAIR  07/01/2011   Procedure: HERNIA  REPAIR VENTRAL ADULT;  Surgeon: Joyice Faster. Cornett, MD;  Location: [place] OR;  Service: General;  Laterality: N/A;    FAMILY HISTORY:  Family History  Problem Relation Age of Onset  . Heart disease Father   . Renal Disease Father   . Dementia Mother   . Heart attack Mother   . Diabetes Sister   . Heart disease Sister   . Diabetes Maternal Aunt   . Diabetes Maternal Uncle   . Diabetes Paternal Aunt   . Diabetes Paternal Uncle   . Heart disease Other   . Heart attack Brother   . Lung cancer Sister   . Renal Disease Brother   . Ovarian cancer Cousin   . Lung cancer Cousin   . Breast cancer Neg Hx   . Colon cancer Neg Hx   . Pancreatic cancer Neg Hx   . Prostate cancer Neg Hx     SOCIAL HISTORY:  Social History   Socioeconomic History  . Marital status: Married    Spouse name: Elisha Headland  . Number of children: 3  . Years of education: Not on file  . Highest education level: Associate degree: academic program  Occupational History  . Occupation: disabled    Employer: DISABLED  Tobacco Use  . Smoking status: Never Smoker  . Smokeless tobacco: Never Used  Substance and Sexual Activity  . Alcohol use: No  . Drug use: No  . Sexual activity: Not Currently  Other Topics Concern  . Not on file  Social History Narrative   Married 6370. 61 year old son in 29. 1 granddaughter from [place].       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 He lives with his wife. He drinks 3-4 cups of coffee a day. He walks most every day.   Social Determinants of Health   Financial Resource Strain:   . Difficulty of Paying Living Expenses: Not on file  Food Insecurity:   . Worried About Charity fundraiser in the Last Year: Not on file  . Ran Out of Food in the Last Year: Not on file  Transportation Needs:   . Lack of Transportation  (Medical): Not on file  . Lack of Transportation (Non-Medical): Not on file  Physical Activity:   . Days of Exercise per Week: Not on file  . Minutes of Exercise per Session: Not on file  Stress:   . Feeling of Stress : Not on file  Social Connections:   . Frequency of Communication with Friends and Family: Not on file  . Frequency of Social Gatherings with Friends and Family: Not on file  . Attends Religious Services:  Not on file  . Active Member of Clubs or Organizations: Not on file  . Attends Archivist Meetings: Not on file  . Marital Status: Not on file  Intimate Partner Violence:   . Fear of Current or Ex-Partner: Not on file  . Emotionally Abused: Not on file  . Physically Abused: Not on file  . Sexually Abused: Not on file    ALLERGIES: Patient has no known allergies.  MEDICATIONS:  Current Outpatient Medications  Medication Sig Dispense Refill  . acetaminophen (TYLENOL) 500 MG tablet Take 1,000 mg by mouth as needed for mild pain or headache.     . allopurinol (ZYLOPRIM) 100 MG tablet Take 100 mg by mouth daily.     [name] amLODipine (NORVASC) 10 MG tablet Take 10 mg by mouth daily.    [name] aspirin 81 MG tablet Take 81 mg by mouth at bedtime.     [name] atorvastatin (LIPITOR) 40 MG tablet Take 40 mg by mouth daily.    . clopidogrel (PLAVIX) 75 MG tablet Take 75 mg by mouth daily.     . diazepam (VALIUM) 5 MG tablet Take 1 tablet (5 mg total) by mouth at bedtime. 30 tablet 0  . furosemide (LASIX) 40 MG tablet Take 1 tablet (40 mg total) by mouth daily. (Patient taking differently: Take 40 mg by mouth 2 (two) times daily. ) 30 tablet   . gabapentin (NEURONTIN) 300 MG capsule Take 2 capsules in AM, 1 capsule at noon and 3 capsules at bedtime (Patient taking differently: Take 3 capsules in AM, 3 capsule at noon and 3 capsules at bedtime) 180 capsule 5  . glucose blood (ACCU-CHEK AVIVA PLUS) test strip USE TO TEST BLOOD SUGAR 3 TIMES A DAY 300 strip 1  . insulin glargine  (LANTUS) 100 UNIT/ML injection Inject 75 Units into the skin 2 (two) times daily.    . Insulin Syringe-Needle U-100 (B-D INS SYR ULTRAFINE 1CC/31G) 31G X 5/16" 1 ML MISC Used to give insulin injections twice daily. 100 each 11  . Magnesium 200 MG TABS Take 200 mg by mouth daily with lunch.     . mycophenolate (MYFORTIC) 180 MG EC tablet Take 180 mg by mouth 2 (two) times daily.    . nitroGLYCERIN (NITROSTAT) 0.4 MG SL tablet PLACE 1 TABLET (0.4 MG TOTAL) UNDER THE TONGUE EVERY 5 (FIVE) MINUTES AS NEEDED FOR CHEST PAIN. 25 tablet 2  . nortriptyline (PAMELOR) 25 MG capsule Take 1 capsule (25 mg total) by mouth at bedtime. 30 capsule 3  . omeprazole (PRILOSEC) 20 MG capsule Take 1 capsule (20 mg total) by mouth 2 (two) times daily before a meal. 180 capsule 3  . Semaglutide,0.25 or 0.'5MG'$ /DOS, (OZEMPIC, 0.25 OR 0.5 MG/DOSE,) 2 MG/1.5ML SOPN Inject into the skin once a week.    . tacrolimus (PROGRAF) 1 MG capsule 5 capsules 2 times daily    . Tacrolimus 1 MG CP24 Take 4 mg by mouth 2 (two) times daily.     . Vitamin D, Ergocalciferol, (DRISDOL) 1.25 MG (50000 UNIT) CAPS capsule Take 50,000 Units by mouth 2 (two) times a week.     No current facility-administered medications for this encounter.    REVIEW OF SYSTEMS:  On review of systems, the patient reports that he is doing well overall. He denies any chest pain, shortness of breath, cough, fevers, chills, night sweats, unintended weight changes. He denies any bowel disturbances, and denies abdominal pain, nausea or vomiting. He denies any new musculoskeletal or joint  aches or pains. His IPSS was 2, indicating mild to no urinary symptoms. He notes nocturia, but states he is woken by chronic pain and simply voids while up.  He rates this chronic low back pain at 7/10. His SHIM was 10, indicating he has moderate to severe erectile dysfunction. A complete review of systems is obtained and is otherwise negative.  PHYSICAL EXAM:  Wt Readings from Last 3  Encounters:  06/06/19 252 lb (114.3 kg)  03/28/19 251 lb 9.6 oz (114.1 kg)  01/16/19 257 lb 6.4 oz (116.8 kg)   Temp Readings from Last 3 Encounters:  03/28/19 (!) 97.5 F (36.4 C)  01/16/19 99.4 F (37.4 C) (Oral)  09/21/18 97.8 F (36.6 C) (Oral)   BP Readings from Last 3 Encounters:  03/28/19 118/70  01/16/19 120/78  09/21/18 120/82   Pulse Readings from Last 3 Encounters:  03/28/19 86  01/16/19 86  06/03/18 (!) 101   Pain Assessment Pain Score: 7  Pain Frequency: Constant Pain Loc: Back/10  In general this is a well appearing African American gentleman in no acute distress. He's alert and oriented x4 and appropriate throughout the examination. Cardiopulmonary assessment is negative for acute distress and he exhibits normal effort.   KPS = 80  100 - Normal; no complaints; no evidence of disease. 90   - Able to carry on normal activity; minor signs or symptoms of disease. 80   - Normal activity with effort; some signs or symptoms of disease. 87   - Cares for self; unable to carry on normal activity or to do active work. 60   - Requires occasional assistance, but is able to care for most of his personal needs. 50   - Requires considerable assistance and frequent medical care. 72   - Disabled; requires special care and assistance. 87   - Severely disabled; hospital admission is indicated although death not imminent. 52   - Very sick; hospital admission necessary; active supportive treatment necessary. 10   - Moribund; fatal processes progressing rapidly. 0     - Dead  Karnofsky DA, Abelmann WH, Craver LS and Burchenal [facility] [phone]) The use of the nitrogen mustards in the palliative treatment of carcinoma: with particular reference to bronchogenic carcinoma Cancer 1 634-56  LABORATORY DATA:  Lab Results  Component Value Date   WBC 3.9 (L) 01/16/2019   HGB 14.2 01/16/2019   HCT 43.4 01/16/2019   MCV 83.3 01/16/2019   PLT 261.0 01/16/2019   Lab Results  Component  Value Date   NA 134 (L) 01/16/2019   K 3.8 01/16/2019   CL 94 (L) 01/16/2019   CO2 32 01/16/2019   Lab Results  Component Value Date   ALT 17 01/16/2019   AST 16 01/16/2019   ALKPHOS 96 01/16/2019   BILITOT 0.6 01/16/2019     RADIOGRAPHY: NM Bone Scan Whole Body  Result Date: 06/08/2019 CLINICAL DATA:  Prostate cancer, PSA 25.6 EXAM: NUCLEAR MEDICINE WHOLE BODY BONE SCAN TECHNIQUE: Whole body anterior and posterior images were obtained approximately 3 hours after intravenous injection of radiopharmaceutical. RADIOPHARMACEUTICALS:  19.9 mCi Technetium-43mMDP IV COMPARISON:  None Correlation: CT chest 04/14/2019 FINDINGS: Minimal uptake bilaterally at the lower lumbar spine at L4-L5 likely degenerative. Minimal uptake at shoulders and sternoclavicular joints nightly degenerative. No worrisome sites of abnormal osseous tracer accumulation are seen to suggest osseous metastatic disease. Absent native renal activity with tracer seen in the RIGHT pelvis likely a transplant kidney. Expected soft tissue distribution of tracer. IMPRESSION:  No scintigraphic evidence of osseous metastatic disease. Electronically Signed   By: Lavonia Dana M.D.   On: 06/08/2019 14:56      IMPRESSION/PLAN: This visit was conducted via MyChart to spare the patient unnecessary potential exposure in the healthcare setting during the current COVID-19 pandemic. 1. 69 y.o. gentleman with Stage T1c adenocarcinoma of the prostate with Gleason Score of 4+3, and PSA of 25.6. We discussed the patient's workup and outlined the nature of prostate cancer in this setting. The patient's T stage, Gleason's score, and PSA put him into the high risk group. Accordingly, he is eligible for a variety of potential treatment options including LT-ADT in combination with either 8 weeks of external radiation or 5 weeks of external radiation followed by a brachytherapy boost. We discussed the available radiation techniques, and focused on the details  and logistics of delivery. The patient is not an ideal candidate for brachytherapy boost with a prostate volume of 120 cc. We discussed and outlined the risks, benefits, short and long-term effects associated with radiotherapy and compared and contrasted these with prostatectomy. We discussed the role of SpaceOAR in reducing the rectal toxicity associated with radiotherapy. We also detailed the role of ADT in the treatment of higher risk prostate cancers and outlined the associated side effects that could be expected with this therapy.  At the end of the conversation, the patient is interested in moving forward with 8 weeks of external beam therapy in combination with ADT. He understands the importance of completing his disease staging with CT and bone imaging which are scheduled for 06/08/2019. We will share our discussion with Dr. Lovena Neighbours and move forward with coordinating a follow up visit for start of ADT, first available, and will arrange for fiducial markers and SpaceOAR gel placement in early March, prior to CT [facility]  to reduce rectal toxicity from radiotherapy.  He appears to have a good understanding of his disease and our recommendations for treatment which are of curative intent.  He is comfortable with and in agreement with the stated plan so, pending there are no unexpected findings on his upcoming staging imaging, we will proceed with treatment planning accordingly in anticipation of beginning IMRT in mid-March 2021. The patient requested we also forward our note to his nephrologist, Dr. Jamal Maes, at [facility]. He is scheduled to follow up with her at the beginning of February 2021.  Given current concerns for patient exposure during the COVID-19 pandemic, this encounter was conducted via video-enabled MyChart visit. The patient has given verbal consent for this type of encounter. The time spent during this encounter was 50 minutes. The attendants for this meeting include Tyler Pita MD, Ashlyn    Bruning PA-C, [place], and patient, [name]. During the encounter, Tyler Pita MD, Ashlyn Bruning PA-C, and scribe, Wilburn Mylar were located at [place].  Patient, [name] was located at home.    Nicholos Johns, PA-C    Tyler Pita, MD  [facility] Direct Dial: [phone]  Fax: [phone] [email or web address]  Skype  [email or web address]  This document serves as a record of services personally performed by Tyler Pita, MD and Freeman Caldron, PA-C. It was created on their behalf by Wilburn Mylar, a trained medical scribe. The creation of this record is based on the scribe's personal observations and the provider's statements to them. This document has been checked and approved by the attending provider.

## 2019-06-08 ENCOUNTER — Other Ambulatory Visit: Payer: Self-pay

## 2019-06-08 ENCOUNTER — Encounter (HOSPITAL_COMMUNITY)
Admission: RE | Admit: 2019-06-08 | Discharge: 2019-06-08 | Disposition: A | Discharge status: Home / Self Care | Payer: Medicare Other | Source: Ambulatory Visit | Attending: Urology | Admitting: Urology

## 2019-06-08 DIAGNOSIS — C61 Malignant neoplasm of prostate: Secondary | ICD-10-CM

## 2019-06-08 MED ORDER — TECHNETIUM TC 99M MEDRONATE IV KIT
20.0000 | PACK | Freq: Once | INTRAVENOUS | Status: AC | PRN
  Administered 2019-06-08: 20 via INTRAVENOUS

## 2019-06-09 ENCOUNTER — Encounter: Payer: Self-pay | Admitting: Family Medicine

## 2019-06-09 DIAGNOSIS — C61 Malignant neoplasm of prostate: Secondary | ICD-10-CM | POA: Insufficient documentation

## 2019-06-12 ENCOUNTER — Telehealth: Payer: Self-pay | Admitting: *Deleted

## 2019-06-12 ENCOUNTER — Telehealth: Payer: Self-pay | Admitting: Family Medicine

## 2019-06-12 NOTE — Telephone Encounter (Signed)
CALLED PATIENT TO INFORM OF ADT APPT. FOR 06-19-19 - ARRIVAL TIME- 12:15 PM @ DR. Jackson Latino OFFICE, LVM FOR A RETURN CALL

## 2019-06-12 NOTE — Telephone Encounter (Signed)
Left message for patient to call back and schedule Medicare Annual Wellness Visit (AWV) either virtually/audio only OR in office. Whatever the patients preference is.  No hx; please schedule at anytime with [facility]-Nurse Health Advisor at [place].  OK for [facility] to schedule   

## 2019-06-13 ENCOUNTER — Telehealth: Payer: Self-pay | Admitting: Medical Oncology

## 2019-06-13 NOTE — Telephone Encounter (Signed)
Spoke with [name] to introduce myself as the prostate nurse navigator and discuss my role. I was unable to meet him 1/12, when he consulted with Dr. Tammi Klippel. He states the consult went well. He has chosen 2 years of ADT with 8 weeks of radiation. He asked when he will get the 4 shots. I informed him of ADT injection 1/25, arriving at 12:15 pm. I discussed the role of ADT and how he will receive the injections. I informed him, he will start radiation approx 8 weeks after the first injections. I discussed placement of gold markers and SpaceOar. All questions were answered and I gave him my contact information and asked him to call me with questions or concerns. He voiced understanding.

## 2019-06-16 ENCOUNTER — Encounter: Payer: Self-pay | Admitting: Urology

## 2019-06-16 ENCOUNTER — Other Ambulatory Visit: Payer: Self-pay | Admitting: Urology

## 2019-06-16 DIAGNOSIS — C61 Malignant neoplasm of prostate: Secondary | ICD-10-CM

## 2019-06-16 NOTE — Progress Notes (Signed)
Staging CT and Bone scan from 06/08/19 are without evidence of metastatic disease.  He is scheduled to start ADT 06/19/19 with Dr. Lovena Neighbours and will have fiducials and SpaceOAR gel placed on 07/25/19 at [place] with plans for CT [facility] on 08/11/19 in anticipation of beginning his 8 week course of daily prostate IMRT shortly thereafter, concurrent with ADT.  [name], MMS, PA-C [place] [facility] [place]: [phone]  Fax: [phone]

## 2019-06-19 ENCOUNTER — Encounter: Payer: Self-pay | Admitting: Medical Oncology

## 2019-06-20 ENCOUNTER — Other Ambulatory Visit: Payer: Self-pay

## 2019-06-20 ENCOUNTER — Encounter: Payer: Self-pay | Admitting: Podiatry

## 2019-06-20 ENCOUNTER — Ambulatory Visit (INDEPENDENT_AMBULATORY_CARE_PROVIDER_SITE_OTHER): Payer: Medicare Other | Admitting: Podiatry

## 2019-06-20 DIAGNOSIS — E1159 Type 2 diabetes mellitus with other circulatory complications: Secondary | ICD-10-CM | POA: Diagnosis not present

## 2019-06-20 DIAGNOSIS — M79676 Pain in unspecified toe(s): Secondary | ICD-10-CM | POA: Diagnosis not present

## 2019-06-20 DIAGNOSIS — B351 Tinea unguium: Secondary | ICD-10-CM

## 2019-06-20 DIAGNOSIS — D689 Coagulation defect, unspecified: Secondary | ICD-10-CM

## 2019-06-20 NOTE — Progress Notes (Signed)
Complaint:  Visit Type: Patient returns to my office for continued preventative foot care services. Complaint: Patient states" my nails have grown long and thick and become painful to walk and wear shoes" Patient has been diagnosed with DM with vascular disease.. The patient presents for preventative foot care services. No changes to ROS.  Patient is taking plavix.  Podiatric Exam: Vascular: dorsalis pedis and posterior tibial pulses are not  palpable bilateral. Capillary return is immediate. Temperature gradient is WNL. Skin turgor WNL  Sensorium: Normal Semmes Weinstein monofilament test. Normal tactile sensation bilaterally. Nail Exam: Pt has thick disfigured discolored nails with subungual debris noted bilateral entire nail hallux through fifth toenails.   Ulcer Exam: There is no evidence of ulcer or pre-ulcerative changes or infection. Orthopedic Exam: Muscle tone and strength are WNL. No limitations in general ROM. No crepitus or effusions noted. Foot type and digits show no abnormalities. Bony prominences are unremarkable. Skin: No Porokeratosis. No infection or ulcers.    Diagnosis:  Onychomycosis, , Pain in right toe, pain in left toes    Treatment & Plan Procedures and Treatment: Consent by patient was obtained for treatment procedures. The patient understood the discussion of treatment and procedures well. All questions were answered thoroughly reviewed. Debridement of mycotic and hypertrophic toenails, 1 through 5 bilateral and clearing of subungual debris. No ulceration, no infection noted. Return Visit-Office Procedure: Patient instructed to return to the office for a follow up visit 3 months for continued evaluation and treatment.    Gardiner Barefoot DPM

## 2019-06-26 ENCOUNTER — Ambulatory Visit (INDEPENDENT_AMBULATORY_CARE_PROVIDER_SITE_OTHER): Payer: Medicare Other

## 2019-06-26 ENCOUNTER — Other Ambulatory Visit: Payer: Self-pay

## 2019-06-26 VITALS — BP 116/78

## 2019-06-26 DIAGNOSIS — Z0000 Encounter for general adult medical examination without abnormal findings: Secondary | ICD-10-CM | POA: Diagnosis not present

## 2019-06-26 NOTE — Progress Notes (Signed)
This visit is being conducted via phone call due to the COVID-19 pandemic. This patient has given me verbal consent via phone to conduct this visit, patient states they are participating from their home address. Some vital signs may be absent or patient reported.   Patient identification: identified by name, DOB, and current address.  Location provider: [facility], Office Persons participating in the virtual visit: Denman Derick LPN, patient, and Dr. Garret Reddish   Subjective:   [name] is a 69 y.o. male who presents for an Initial Medicare Annual Wellness Visit.  Review of Systems   Cardiac Risk Factors include: advanced age (>38men, >20 women);dyslipidemia;diabetes mellitus;hypertension;male gender   Objective:    Today's Vitals   06/26/19 1433  BP: 116/78   There is no height or weight on file to calculate BMI.  Advanced Directives 06/26/2019 06/06/2019 11/22/2017 01/19/2017 06/06/2015 02/18/2015 01/15/2015  Does Patient Have a Medical Advance Directive? Yes Yes Yes No Yes Yes Yes  Type of Paramedic of [place];Living will Living will Living will - Living will Living will Living will  Does patient want to make changes to medical advance directive? - No - Patient declined No - Patient declined - - - -  Copy of [place] in Chart? No - copy requested - - - - - No - copy requested  Would patient like information on creating a medical advance directive? - - - No - Patient declined - - -    Current Medications (verified) Outpatient Encounter Medications as of 06/26/2019  Medication Sig  . acetaminophen (TYLENOL) 500 MG tablet Take 1,000 mg by mouth as needed for mild pain or headache.   . allopurinol (ZYLOPRIM) 100 MG tablet Take 100 mg by mouth daily.   [name] amLODipine (NORVASC) 10 MG tablet Take 10 mg by mouth daily.  [name] aspirin 81 MG tablet Take 81 mg by mouth at bedtime.   [name] atorvastatin (LIPITOR) 40 MG tablet Take 40 mg by mouth  daily.  . clopidogrel (PLAVIX) 75 MG tablet Take 75 mg by mouth daily.   . diazepam (VALIUM) 5 MG tablet Take 1 tablet (5 mg total) by mouth at bedtime.  . furosemide (LASIX) 40 MG tablet Take 1 tablet (40 mg total) by mouth daily. (Patient taking differently: Take 40 mg by mouth 2 (two) times daily. )  . gabapentin (NEURONTIN) 300 MG capsule Take 2 capsules in AM, 1 capsule at noon and 3 capsules at bedtime (Patient taking differently: Take 3 capsules in AM, 3 capsule at noon and 3 capsules at bedtime)  . glucose blood (ACCU-CHEK AVIVA PLUS) test strip USE TO TEST BLOOD SUGAR 3 TIMES A DAY  . insulin glargine (LANTUS) 100 UNIT/ML injection Inject 75 Units into the skin 2 (two) times daily.  . Insulin Syringe-Needle U-100 (B-D INS SYR ULTRAFINE 1CC/31G) 31G X 5/16" 1 ML MISC Used to give insulin injections twice daily.  . Magnesium 200 MG TABS Take 200 mg by mouth daily with lunch.   . mycophenolate (MYFORTIC) 180 MG EC tablet Take 180 mg by mouth 2 (two) times daily.  . nitroGLYCERIN (NITROSTAT) 0.4 MG SL tablet PLACE 1 TABLET (0.4 MG TOTAL) UNDER THE TONGUE EVERY 5 (FIVE) MINUTES AS NEEDED FOR CHEST PAIN.  [name] nortriptyline (PAMELOR) 25 MG capsule Take 1 capsule (25 mg total) by mouth at bedtime.  [name] omeprazole (PRILOSEC) 20 MG capsule Take 1 capsule (20 mg total) by mouth 2 (two) times daily before a meal.  .  Semaglutide,0.25 or 0.5MG /DOS, (OZEMPIC, 0.25 OR 0.5 MG/DOSE,) 2 MG/1.5ML SOPN Inject into the skin once a week.  . tacrolimus (PROGRAF) 1 MG capsule 5 capsules 2 times daily  . Tacrolimus 1 MG CP24 Take 4 mg by mouth 2 (two) times daily.   . Vitamin D, Ergocalciferol, (DRISDOL) 1.25 MG (50000 UNIT) CAPS capsule Take 50,000 Units by mouth 2 (two) times a week.   No facility-administered encounter medications on file as of 06/26/2019.    Allergies (verified) Patient has no known allergies.   History: Past Medical History:  Diagnosis Date  . Allergy   . Angina   . Arthritis   .  Backache, unspecified   . CHF (congestive heart failure) ([place])   . Chills   . Chronic kidney disease, stage III (moderate)    HD T- TH-SAT  . Complication of anesthesia    01/2011 could not eat,, hospt x2, was placed on hd and cleared up  . Coronary atherosclerosis of native coronary artery   . Diabetes mellitus 2004  . Dizziness   . Dysphagia, unspecified(787.20)   . Gastroparesis   . Headache(784.0)   . Hemorrhage of rectum and anus   . Hyperlipidemia   . Hypertrophy of prostate with urinary obstruction and other lower urinary tract symptoms (LUTS)   . INTERNAL HEMORRHOIDS 10/16/2008   Qualifier: Diagnosis of  By: Nolon Rod CMA (AAMA), Robin    . Internal hemorrhoids without mention of complication   . Myocardial infarction ([place]) 2002  . Nausea alone   . Obesity   . Other dyspnea and respiratory abnormality   . Other malaise and fatigue   . Personal history of unspecified circulatory disease   . Posttraumatic stress disorder   . Prostate cancer ([place])   . Sleep apnea    USES CPAP   . Stroke [facility])    2007  . Unspecified essential hypertension    hx htn    Past Surgical History:  Procedure Laterality Date  . ANAL FISSURECTOMY    . AV FISTULA PLACEMENT    . CARDIAC CATHETERIZATION     2005 DR BRODIE  . HEMORRHOID SURGERY    . PROSTATE BIOPSY    . revision of fistula     renal failure  . VENTRAL HERNIA REPAIR  07/01/2011   Procedure: HERNIA REPAIR VENTRAL ADULT;  Surgeon: Joyice Faster. Cornett, MD;  Location: [facility] OR;  Service: General;  Laterality: N/A;   Family History  Problem Relation Age of Onset  . Heart disease Father   . Renal Disease Father   . Dementia Mother   . Heart attack Mother   . Diabetes Sister   . Heart disease Sister   . Diabetes Maternal Aunt   . Diabetes Maternal Uncle   . Diabetes Paternal Aunt   . Diabetes Paternal Uncle   . Heart disease Other   . Heart attack Brother   . Lung cancer Sister   . Renal Disease Brother   . Ovarian cancer Cousin    . Lung cancer Cousin   . Breast cancer Neg Hx   . Colon cancer Neg Hx   . Pancreatic cancer Neg Hx   . Prostate cancer Neg Hx    Social History   Socioeconomic History  . Marital status: Married    Spouse name: Elisha Headland  . Number of children: 3  . Years of education: Not on file  . Highest education level: Associate degree: academic program  Occupational History  . Occupation: disabled  Employer: DISABLED  Tobacco Use  . Smoking status: Never Smoker  . Smokeless tobacco: Never Used  Substance and Sexual Activity  . Alcohol use: No  . Drug use: No  . Sexual activity: Not Currently  Other Topics Concern  . Not on file  Social History Narrative   Married 7733. 23 year old son in 30. 1 granddaughter from [place].       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 He lives with his wife. He drinks 3-4 cups of coffee a day. He walks most every day.   Social Determinants of Health   Financial Resource Strain:   . Difficulty of Paying Living Expenses: Not on file  Food Insecurity:   . Worried About Charity fundraiser in the Last Year: Not on file  . Ran Out of Food in the Last Year: Not on file  Transportation Needs:   . Lack of Transportation (Medical): Not on file  . Lack of Transportation (Non-Medical): Not on file  Physical Activity:   . Days of Exercise per Week: Not on file  . Minutes of Exercise per Session: Not on file  Stress:   . Feeling of Stress : Not on file  Social Connections:   . Frequency of Communication with Friends and Family: Not on file  . Frequency of Social Gatherings with Friends and Family: Not on file  . Attends Religious Services: Not on file  . Active Member of Clubs or Organizations: Not on file  . Attends Archivist Meetings: Not on file  . Marital Status: Not on file    Tobacco Counseling Counseling given: Not Answered   Clinical Intake:  Pre-visit preparation completed: Yes  Pain : No/denies pain  Diabetes: Yes(patient reports fasting glucose to be between 100 and 130) CBG done?: No Did pt. bring in CBG monitor from home?: No  Interpreter Needed?: No  Information entered by :: Denman Theran LPN  Activities of Daily Living In your present state of health, do you have any difficulty performing the following activities: 06/26/2019  Hearing? N  Vision? N  Difficulty concentrating or making decisions? N  Walking or climbing stairs? N  Dressing or bathing? N  Doing errands, shopping? N  Preparing Food and eating ? N  Using the Toilet? N  In the past six months, have you accidently leaked urine? N  Do you have problems with loss of bowel control? N  Managing your Medications? N  Managing your Finances? N  Housekeeping or managing your Housekeeping? N  Some recent data might be hidden     Immunizations and Health Maintenance Immunization History  Administered Date(s) Administered  . Fluad Quad(high Dose 65+) 01/16/2019  . Influenza Split 02/23/2012  . Influenza Whole 02/23/2000  . Influenza, High Dose Seasonal PF 05/01/2016  . Influenza-Unspecified 03/07/2014, 02/18/2015, 04/08/2016, 02/15/2017  . Pneumococcal Conjugate-13 12/03/2017  . Pneumococcal Polysaccharide-23 05/26/2012, 10/25/2012   Health Maintenance Due  Topic Date Due  . TETANUS/TDAP  11/23/1967  . COLONOSCOPY  11/23/1998  . PNA vac Low Risk Adult (2 of 2 - PPSV23) 12/04/2018  . OPHTHALMOLOGY EXAM  06/25/2019    Patient Care Team: [name], MD as PCP - General (Family Medicine) Stanford Breed Denice Bors, MD as PCP - Cardiology (Cardiology) Foye Spurling, MD as Consulting Physician (Internal Medicine) Lorrene Reid,  Caren Griffins, MD as Consulting Physician (Nephrology) Tyler Pita, MD as Consulting Physician (Radiation Oncology) Ceasar Mons, MD as  Consulting Physician (Urology) Pieter Partridge, DO as Consulting Physician (Neurology) Gardiner Barefoot, DPM as Consulting Physician (Podiatry) Marybelle Killings, MD as Consulting Physician (Orthopedic Surgery) [facility] as Consulting Physician  Indicate any recent Medical Services you may have received from other than [facility] providers in the past year (date may be approximate).    Assessment:   This is a routine wellness examination for Gerome.  Hearing/Vision screen No exam data present  Dietary issues and exercise activities discussed: Current Exercise Habits: Home exercise routine, Type of exercise: walking, Time (Minutes): 30, Frequency (Times/Week): 4, Weekly Exercise (Minutes/Week): 120, Intensity: Mild  Goals   None    Depression Screen PHQ 2/9 Scores 06/26/2019 06/03/2018 01/28/2017 01/16/2016  PHQ - 2 Score 0 0 0 0    Fall Risk Fall Risk  06/26/2019 04/08/2018 01/04/2018 01/28/2017 01/16/2016  Falls in the past year? 1 1 Yes Yes Yes  Number falls in past yr: 1 1 2  or more 1 2 or more  Injury with Fall? 1 0 No Yes Yes  Risk Factor Category  - - High Fall Risk - -  Risk for fall due to : History of fall(s);Impaired balance/gait;Impaired mobility - - - -  Follow up Falls prevention discussed;Education provided;Falls evaluation completed Falls evaluation completed - - -    Is the patient's home free of loose throw rugs in walkways, pet beds, electrical cords, etc?   yes      Grab bars in the bathroom? yes      Handrails on the stairs?   yes      Adequate lighting?   yes  Cognitive Function:     6CIT Screen 06/26/2019  What Year? 0 points  What month? 0 points  What time? 0 points  Count back from 20 0 points  Months in reverse 0 points  Repeat phrase 0 points  Total Score 0    Screening Tests Health Maintenance  Topic Date Due  . TETANUS/TDAP  11/23/1967  . COLONOSCOPY  11/23/1998  . PNA vac Low Risk Adult (2 of 2 - PPSV23) 12/04/2018  . OPHTHALMOLOGY EXAM   06/25/2019  . HEMOGLOBIN A1C  07/19/2019  . FOOT EXAM  01/16/2020  . INFLUENZA VACCINE  Completed  . Hepatitis C Screening  Completed    Qualifies for Shingles Vaccine? Discussed and patient will check with pharmacy for coverage.  Patient education handout provided   Cancer Screenings: Lung: Low Dose CT Chest recommended if Age 76-80 years, 30 pack-year currently smoking OR have quit w/in 15years. Patient does not qualify. Colorectal: colonoscopy 2016 at [facility] prior to transplant     Plan:  I have personally reviewed and addressed the Medicare Annual Wellness questionnaire and have noted the following in the patient's chart:  A. Medical and social history B. Use of alcohol, tobacco or illicit drugs  C. Current medications and supplements D. Functional ability and status E.  Nutritional status F.  Physical activity G. Advance directives H. List of other physicians I.  Hospitalizations, surgeries, and ER visits in previous 12 months J.  [place] such as hearing and vision if needed, cognitive and depression L. Referrals, records requested, and appointments-  Patient would like referral to new endocrinologist   In addition, I have reviewed and discussed with patient certain preventive protocols, quality metrics, and best practice recommendations. A written personalized care plan for  preventive services as well as general preventive health recommendations were provided to patient.   Signed,  Denman Tobyn, LPN  Nurse Health Advisor   Nurse Notes: Patient is requesting referral to new endocrinologist in this are.  Has seen Dr. Loanne Drilling but would prefer to not return there.  He is interested in CGM for blood sugar monitoring.  Is currently being see at the [place] but believes that his needs can be better services closer to home.  Please advise.

## 2019-06-26 NOTE — Patient Instructions (Signed)
[name] , Thank you for taking time to come for your Medicare Wellness Visit. I appreciate your ongoing commitment to your health goals. Please review the following plan we discussed and let me know if I can assist you in the future.   Screening recommendations/referrals: Colorectal Screening: up to date; last colonoscopy 2016  Vision and Dental Exams: Recommended annual ophthalmology exams for early detection of glaucoma and other disorders of the eye Recommended annual dental exams for proper oral hygiene  Diabetic Exams: Diabetic Eye Exam: recommended yearly Diabetic Foot Exam: recommended yearly  Vaccinations: Influenza vaccine: up to date; last 01/16/19 Pneumococcal vaccine: up to date; last 12/03/17 Tdap vaccine: up to date; last prior to transplant  Shingles vaccine: Please call your insurance company to determine your out of pocket expense for the Shingrix vaccine. You may receive this vaccine at your local pharmacy.  Advanced directives: Please bring a copy of your POA (Power of Attorney) and/or Living Will to your next appointment.  Goals: Recommend to drink at least 6-8 8oz glasses of water per day and consume a balanced diet rich in fresh fruits and vegetables.    Next appointment: Please schedule your Annual Wellness Visit with your Nurse Health Advisor in one year.  Preventive Care 82 Years and Older, Male Preventive care refers to lifestyle choices and visits with your health care provider that can promote health and wellness. What does preventive care include?  A yearly physical exam. This is also called an annual well check.  Dental exams once or twice a year.  Routine eye exams. Ask your health care provider how often you should have your eyes checked.  Personal lifestyle choices, including:  Daily care of your teeth and gums.  Regular physical activity.  Eating a healthy diet.  Avoiding tobacco and drug use.  Limiting alcohol use.  Practicing safe  sex.  Taking low doses of aspirin every day if recommended by your health care provider..  Taking vitamin and mineral supplements as recommended by your health care provider. What happens during an annual well check? The services and screenings done by your health care provider during your annual well check will depend on your age, overall health, lifestyle risk factors, and family history of disease. Counseling  Your health care provider may ask you questions about your:  Alcohol use.  Tobacco use.  Drug use.  Emotional well-being.  Home and relationship well-being.  Sexual activity.  Eating habits.  History of falls.  Memory and ability to understand (cognition).  Work and work Statistician. Screening  You may have the following tests or measurements:  Height, weight, and BMI.  Blood pressure.  Lipid and cholesterol levels. These may be checked every 5 years, or more frequently if you are over 73 years old.  Skin check.  Lung cancer screening. You may have this screening every year starting at age 42 if you have a 30-pack-year history of smoking and currently smoke or have quit within the past 15 years.  Fecal occult blood test (FOBT) of the stool. You may have this test every year starting at age 3.  Flexible sigmoidoscopy or colonoscopy. You may have a sigmoidoscopy every 5 years or a colonoscopy every 10 years starting at age 79.  Prostate cancer screening. Recommendations will vary depending on your family history and other risks.  Hepatitis C blood test.  Hepatitis B blood test.  Sexually transmitted disease (STD) testing.  Diabetes screening. This is done by checking your blood sugar (glucose) after  you have not eaten for a while (fasting). You may have this done every 1-3 years.  Abdominal aortic aneurysm (AAA) screening. You may need this if you are a current or former smoker.  Osteoporosis. You may be screened starting at age 69 if you are at high  risk. Talk with your health care provider about your test results, treatment options, and if necessary, the need for more tests. Vaccines  Your health care provider may recommend certain vaccines, such as:  Influenza vaccine. This is recommended every year.  Tetanus, diphtheria, and acellular pertussis (Tdap, Td) vaccine. You may need a Td booster every 10 years.  Zoster vaccine. You may need this after age 54.  Pneumococcal 13-valent conjugate (PCV13) vaccine. One dose is recommended after age 71.  Pneumococcal polysaccharide (PPSV23) vaccine. One dose is recommended after age 29. Talk to your health care provider about which screenings and vaccines you need and how often you need them. This information is not intended to replace advice given to you by your health care provider. Make sure you discuss any questions you have with your health care provider. Document Released: 06/07/2015 Document Revised: 01/29/2016 Document Reviewed: 03/12/2015 Elsevier Interactive Patient Education  2017 [place] Prevention in the Home Falls can cause injuries. They can happen to people of all ages. There are many things you can do to make your home safe and to help prevent falls. What can I do on the outside of my home?  Regularly fix the edges of walkways and driveways and fix any cracks.  Remove anything that might make you trip as you walk through a door, such as a raised step or threshold.  Trim any bushes or trees on the path to your home.  Use bright outdoor lighting.  Clear any walking paths of anything that might make someone trip, such as rocks or tools.  Regularly check to see if handrails are loose or broken. Make sure that both sides of any steps have handrails.  Any raised decks and porches should have guardrails on the edges.  Have any leaves, snow, or ice cleared regularly.  Use sand or salt on walking paths during winter.  Clean up any spills in your garage right  away. This includes oil or grease spills. What can I do in the bathroom?  Use night lights.  Install grab bars by the toilet and in the tub and shower. Do not use towel bars as grab bars.  Use non-skid mats or decals in the tub or shower.  If you need to sit down in the shower, use a plastic, non-slip stool.  Keep the floor dry. Clean up any water that spills on the floor as soon as it happens.  Remove soap buildup in the tub or shower regularly.  Attach bath mats securely with double-sided non-slip rug tape.  Do not have throw rugs and other things on the floor that can make you trip. What can I do in the bedroom?  Use night lights.  Make sure that you have a light by your bed that is easy to reach.  Do not use any sheets or blankets that are too big for your bed. They should not hang down onto the floor.  Have a firm chair that has side arms. You can use this for support while you get dressed.  Do not have throw rugs and other things on the floor that can make you trip. What can I do in the kitchen?  Clean  up any spills right away.  Avoid walking on wet floors.  Keep items that you use a lot in easy-to-reach places.  If you need to reach something above you, use a strong step stool that has a grab bar.  Keep electrical cords out of the way.  Do not use floor polish or wax that makes floors slippery. If you must use wax, use non-skid floor wax.  Do not have throw rugs and other things on the floor that can make you trip. What can I do with my stairs?  Do not leave any items on the stairs.  Make sure that there are handrails on both sides of the stairs and use them. Fix handrails that are broken or loose. Make sure that handrails are as long as the stairways.  Check any carpeting to make sure that it is firmly attached to the stairs. Fix any carpet that is loose or worn.  Avoid having throw rugs at the top or bottom of the stairs. If you do have throw rugs, attach  them to the floor with carpet tape.  Make sure that you have a light switch at the top of the stairs and the bottom of the stairs. If you do not have them, ask someone to add them for you. What else can I do to help prevent falls?  Wear shoes that:  Do not have high heels.  Have rubber bottoms.  Are comfortable and fit you well.  Are closed at the toe. Do not wear sandals.  If you use a stepladder:  Make sure that it is fully opened. Do not climb a closed stepladder.  Make sure that both sides of the stepladder are locked into place.  Ask someone to hold it for you, if possible.  Clearly mark and make sure that you can see:  Any grab bars or handrails.  First and last steps.  Where the edge of each step is.  Use tools that help you move around (mobility aids) if they are needed. These include:  Canes.  Walkers.  Scooters.  Crutches.  Turn on the lights when you go into a dark area. Replace any light bulbs as soon as they burn out.  Set up your furniture so you have a clear path. Avoid moving your furniture around.  If any of your floors are uneven, fix them.  If there are any pets around you, be aware of where they are.  Review your medicines with your doctor. Some medicines can make you feel dizzy. This can increase your chance of falling. Ask your doctor what other things that you can do to help prevent falls. This information is not intended to replace advice given to you by your health care provider. Make sure you discuss any questions you have with your health care provider. Document Released: 03/07/2009 Document Revised: 10/17/2015 Document Reviewed: 06/15/2014 Elsevier Interactive Patient Education  2017 [employer].

## 2019-06-26 NOTE — Progress Notes (Unsigned)
Virtual Visit via Video Note The purpose of this virtual visit is to provide medical care while limiting exposure to the novel coronavirus.    Consent was obtained for video visit:  Yes.   Answered questions that patient had about telehealth interaction:  Yes.   I discussed the limitations, risks, security and privacy concerns of performing an evaluation and management service by telemedicine. I also discussed with the patient that there may be a patient responsible charge related to this service. The patient expressed understanding and agreed to proceed.  Pt location: Home Physician Location: office Name of referring provider:  [name], MD I connected with [name] at patients initiation/request on 06/27/2019 at 10:50 AM EST by video enabled telemedicine application and verified that I am speaking with the correct person using two identifiers. Pt MRN:  [id number] Pt DOB:  03/11/1951 Video Participants:  [name]   History of Present Illness:  [name] is a 69 year old right-handed man with type 2 diabetes mellitus complicated by polyneuropathy, renal failure status post transplant 2017, gastroparesis, CHF, CAD, and history of stroke who follows up for diabetic polyneuropathy and lumbosacral radiculopathy.  UPDATE: Current medication: Nortriptyline 25mg  at bedtime; gabapentin 600mg /300mg /900mg    HISTORY: He has history lumbar radiculopathy down both hips and legs (worse on the left) since injuring his back in the [employer] many years ago. MRI of lumbar spine from 06/16/13 showed disc bulge and facet arthropathy with moderate biforaminal narrowing at L5-S1. In the past he would get epidural injections which helped for about 6 months. He has diabetic neuropathy and has numbness and pain in the feet. He also has gout in the big toes. He has problems with balance. He has some residual weakness in the right leg due to stroke. He has weakness in the left hand  and armdue to fistula. He has associated numbness. In 2020, pain in left leg has started to become worse.  Due to worsening lumbosacral radicular pain, gabapentin was increased and he was started on nortriptyline.  He saw the orthopedist.  MRI of lumbar spine from 10/07/2018 showed slight progression of degenerative disc disease with mild left-predominant multilevel neural foraminal stenosis but no spinal stenosis.  He also endorsed neck pain, so MRI of cervical spine was also performed, which demonstrated mild degenerative disc disease predominantly at C5-6 with mild bilateral neural foraminal stenosis but no spinal stenosis.  Past medications:  Tramadol 50mg ; Lyrica (aggravated PTSD);   Past Medical History: Past Medical History:  Diagnosis Date  . Allergy   . Angina   . Arthritis   . Backache, unspecified   . CHF (congestive heart failure) ([place])   . Chills   . Chronic kidney disease, stage III (moderate)    HD T- TH-SAT  . Complication of anesthesia    01/2011 could not eat,, hospt x2, was placed on hd and cleared up  . Coronary atherosclerosis of native coronary artery   . Diabetes mellitus 2004  . Dizziness   . Dysphagia, unspecified(787.20)   . Gastroparesis   . Headache(784.0)   . Hemorrhage of rectum and anus   . Hyperlipidemia   . Hypertrophy of prostate with urinary obstruction and other lower urinary tract symptoms (LUTS)   . INTERNAL HEMORRHOIDS 10/16/2008   Qualifier: Diagnosis of  By: Nolon Rod CMA (AAMA), Robin    . Internal hemorrhoids without mention of complication   . Myocardial infarction ([place]) 2002  . Nausea alone   . Obesity   .  Other dyspnea and respiratory abnormality   . Other malaise and fatigue   . Personal history of unspecified circulatory disease   . Posttraumatic stress disorder   . Prostate cancer ([place])   . Sleep apnea    USES CPAP   . Stroke [facility])    2007  . Unspecified essential hypertension    hx htn     Medications: Outpatient  Encounter Medications as of 06/27/2019  Medication Sig Note  . acetaminophen (TYLENOL) 500 MG tablet Take 1,000 mg by mouth as needed for mild pain or headache.    . allopurinol (ZYLOPRIM) 100 MG tablet Take 100 mg by mouth daily.    [name] amLODipine (NORVASC) 10 MG tablet Take 10 mg by mouth daily.   [name] aspirin 81 MG tablet Take 81 mg by mouth at bedtime.  11/21/2017: Patient received additional tablets of this today  . atorvastatin (LIPITOR) 40 MG tablet Take 40 mg by mouth daily.   . clopidogrel (PLAVIX) 75 MG tablet Take 75 mg by mouth daily.    . diazepam (VALIUM) 5 MG tablet Take 1 tablet (5 mg total) by mouth at bedtime.   . furosemide (LASIX) 40 MG tablet Take 1 tablet (40 mg total) by mouth daily. (Patient taking differently: Take 40 mg by mouth 2 (two) times daily. )   . gabapentin (NEURONTIN) 300 MG capsule Take 2 capsules in AM, 1 capsule at noon and 3 capsules at bedtime (Patient taking differently: Take 3 capsules in AM, 3 capsule at noon and 3 capsules at bedtime)   . glucose blood (ACCU-CHEK AVIVA PLUS) test strip USE TO TEST BLOOD SUGAR 3 TIMES A DAY   . insulin glargine (LANTUS) 100 UNIT/ML injection Inject 75 Units into the skin 2 (two) times daily.   . Insulin Syringe-Needle U-100 (B-D INS SYR ULTRAFINE 1CC/31G) 31G X 5/16" 1 ML MISC Used to give insulin injections twice daily.   . Magnesium 200 MG TABS Take 200 mg by mouth daily with lunch.    . mycophenolate (MYFORTIC) 180 MG EC tablet Take 180 mg by mouth 2 (two) times daily. 11/21/2017: Regimen confirmed to be accurate by the patient  . nitroGLYCERIN (NITROSTAT) 0.4 MG SL tablet PLACE 1 TABLET (0.4 MG TOTAL) UNDER THE TONGUE EVERY 5 (FIVE) MINUTES AS NEEDED FOR CHEST PAIN.   [name] nortriptyline (PAMELOR) 25 MG capsule Take 1 capsule (25 mg total) by mouth at bedtime.   [name] omeprazole (PRILOSEC) 20 MG capsule Take 1 capsule (20 mg total) by mouth 2 (two) times daily before a meal.   . Semaglutide,0.25 or 0.5MG /DOS, (OZEMPIC, 0.25 OR 0.5  MG/DOSE,) 2 MG/1.5ML SOPN Inject into the skin once a week.   . tacrolimus (PROGRAF) 1 MG capsule 5 capsules 2 times daily   . Tacrolimus 1 MG CP24 Take 4 mg by mouth 2 (two) times daily.  11/21/2017: Regimen confirmed to be accurate by the patient  . Vitamin D, Ergocalciferol, (DRISDOL) 1.25 MG (50000 UNIT) CAPS capsule Take 50,000 Units by mouth 2 (two) times a week.    No facility-administered encounter medications on file as of 06/27/2019.    Allergies: No Known Allergies  Family History: Family History  Problem Relation Age of Onset  . Heart disease Father   . Renal Disease Father   . Dementia Mother   . Heart attack Mother   . Diabetes Sister   . Heart disease Sister   . Diabetes Maternal Aunt   . Diabetes Maternal Uncle   . Diabetes Paternal  Aunt   . Diabetes Paternal Uncle   . Heart disease Other   . Heart attack Brother   . Lung cancer Sister   . Renal Disease Brother   . Ovarian cancer Cousin   . Lung cancer Cousin   . Breast cancer Neg Hx   . Colon cancer Neg Hx   . Pancreatic cancer Neg Hx   . Prostate cancer Neg Hx     Social History: Social History   Socioeconomic History  . Marital status: Married    Spouse name: Elisha Headland  . Number of children: 3  . Years of education: Not on file  . Highest education level: Associate degree: academic program  Occupational History  . Occupation: disabled    Employer: DISABLED  Tobacco Use  . Smoking status: Never Smoker  . Smokeless tobacco: Never Used  Substance and Sexual Activity  . Alcohol use: No  . Drug use: No  . Sexual activity: Not Currently  Other Topics Concern  . Not on file  Social History Narrative   Married 6234. 21 year old son in 88. 1 granddaughter from [place].       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 He lives with his wife. He  drinks 3-4 cups of coffee a day. He walks most every day.   Social Determinants of Health   Financial Resource Strain:   . Difficulty of Paying Living Expenses: Not on file  Food Insecurity:   . Worried About Charity fundraiser in the Last Year: Not on file  . Ran Out of Food in the Last Year: Not on file  Transportation Needs:   . Lack of Transportation (Medical): Not on file  . Lack of Transportation (Non-Medical): Not on file  Physical Activity:   . Days of Exercise per Week: Not on file  . Minutes of Exercise per Session: Not on file  Stress:   . Feeling of Stress : Not on file  Social Connections:   . Frequency of Communication with Friends and Family: Not on file  . Frequency of Social Gatherings with Friends and Family: Not on file  . Attends Religious Services: Not on file  . Active Member of Clubs or Organizations: Not on file  . Attends Archivist Meetings: Not on file  . Marital Status: Not on file  Intimate Partner Violence:   . Fear of Current or Ex-Partner: Not on file  . Emotionally Abused: Not on file  . Physically Abused: Not on file  . Sexually Abused: Not on file    Observations/Objective:   *** No acute distress.  Alert and oriented.  Speech fluent and not dysarthric.  Language intact.  Eyes orthophoric on primary gaze.  Face symmetric.  Assessment and Plan:   1. Diabetic polyneuropathy 2.  Chronic low back pain 3.  Chronic neck pain.  ***  Follow Up Instructions:    -I discussed the assessment and treatment plan with the patient. The patient was provided an opportunity to ask questions and all were answered. The patient agreed with the plan and demonstrated an understanding of the instructions.   The patient was advised to call back or seek an in-person evaluation if the symptoms worsen or if the condition fails to improve as anticipated.    Total Time spent in visit with the  patient was:  ***, of which more than 50% of the time was  spent in counseling and/or coordinating care on ***.   Pt understands and agrees with the plan of care outlined.     Dudley Major, DO

## 2019-06-27 ENCOUNTER — Telehealth: Payer: Medicare Other | Admitting: Neurology

## 2019-06-29 ENCOUNTER — Telehealth: Payer: Self-pay | Admitting: Physical Therapy

## 2019-06-29 ENCOUNTER — Other Ambulatory Visit: Payer: Self-pay

## 2019-06-29 ENCOUNTER — Telehealth: Payer: Self-pay

## 2019-06-29 ENCOUNTER — Encounter: Payer: Self-pay | Admitting: Neurology

## 2019-06-29 ENCOUNTER — Telehealth (INDEPENDENT_AMBULATORY_CARE_PROVIDER_SITE_OTHER): Payer: Medicare Other | Admitting: Neurology

## 2019-06-29 DIAGNOSIS — E1142 Type 2 diabetes mellitus with diabetic polyneuropathy: Secondary | ICD-10-CM

## 2019-06-29 DIAGNOSIS — G253 Myoclonus: Secondary | ICD-10-CM | POA: Diagnosis not present

## 2019-06-29 DIAGNOSIS — R2681 Unsteadiness on feet: Secondary | ICD-10-CM | POA: Diagnosis not present

## 2019-06-29 DIAGNOSIS — M5416 Radiculopathy, lumbar region: Secondary | ICD-10-CM

## 2019-06-29 MED ORDER — GABAPENTIN 300 MG PO CAPS: ORAL_CAPSULE | ORAL | 5 refills | Status: DC

## 2019-06-29 MED ORDER — NORTRIPTYLINE HCL 25 MG PO CAPS: 50.0000 mg | ORAL_CAPSULE | Freq: Every day | ORAL | 5 refills | Status: DC

## 2019-06-29 NOTE — Telephone Encounter (Signed)
-----   Message from Pieter Partridge, DO sent at 06/29/2019 11:12 AM EST ----- Refer to [facility] on [facility] for unsteady gait.  Thank you

## 2019-06-29 NOTE — Telephone Encounter (Signed)
Called and spoke with patient regarding therapy referral received per facility screening policy due to high risk for complications from Covid. He wishes to attend in person but wants to hold off on therapy until after his 2nd Covid vaccine shot next week so he will be contacted to schedule eval accordingly.

## 2019-06-29 NOTE — Progress Notes (Signed)
Virtual Visit via Video Note The purpose of this virtual visit is to provide medical care while limiting exposure to the novel coronavirus.    Consent was obtained for video visit:  Yes.   Answered questions that patient had about telehealth interaction:  Yes.   I discussed the limitations, risks, security and privacy concerns of performing an evaluation and management service by telemedicine. I also discussed with the patient that there may be a patient responsible charge related to this service. The patient expressed understanding and agreed to proceed.  Pt location: Home Physician Location: office Name of referring provider:  [name], MD I connected with [name] at patients initiation/request on 06/29/2019 at 10:50 AM EST by video enabled telemedicine application and verified that I am speaking with the correct person using two identifiers. Pt MRN:  [id number] Pt DOB:  [birth year] Video Participants:  [name]   History of Present Illness:  [name] is a69year old right-handed man with type 2 diabetes mellitus complicated by polyneuropathy, renal failure status post transplant 2017, gastroparesis, CHF, CAD, and history of stroke who follows up for diabetic polyneuropathy and lumbosacral radiculopathy.  UPDATE: Current medication: Nortriptyline 25mg  at bedtime; gabapentin 600mg /300mg /900mg   Since last visit, he has been diagnosed with prostate cancer and will soon start radiation therapy.  His balance is off and has had a couple of falls usually when getting out of bed and going to the bathroom at night or after getting up from prolonged sitting or laying down.  He was previously in physical therapy up until this past summer because his therapist became sick and he never restarted it.  He hasn't been walking as much since the weather is cold.  His hands still tremble and has trouble getting blood draws.    HISTORY: He has history lumbar radiculopathy  down both hips and legs (worse on the left) since injuring his back in the [employer] many years ago. MRI of lumbar spine from 06/16/13 showed disc bulge and facet arthropathy with moderate biforaminal narrowing at L5-S1. In the past he would get epidural injections which helped for about 6 months. He has diabetic neuropathy and has numbness and pain in the feet. He also has gout in the big toes. He has problems with balance. He has some residual weakness in the right leg due to stroke. 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 He has associated numbness. In 2020, pain in left leg has started to become worse.  Due to worsening lumbosacral radicular pain, gabapentin was increased and he was started on nortriptyline. He saw the orthopedist. MRI of lumbar spine from 10/07/2018 showed slight progression of degenerative disc disease with mild left-predominant multilevel neural foraminal stenosis but no spinal stenosis. He also endorsed neck pain, so MRI of cervical spine was also performed, which demonstrated mild degenerative disc disease predominantly at C5-6 with mild bilateral neural foraminal stenosis but no spinal stenosis.  Past medications: Tramadol 50mg ; Lyrica (aggravated PTSD);   Past Medical History: Past Medical History:  Diagnosis Date  . Allergy   . Angina   . Arthritis   . Backache, unspecified   . CHF (congestive heart failure) ([place])   . Chills   . Chronic kidney disease, stage III (moderate)    HD T- TH-SAT  . Complication of anesthesia    01/2011 could not eat,, hospt x2, was placed on hd and cleared up  . Coronary atherosclerosis of native coronary artery   .  Diabetes mellitus 2004  . Dizziness   . Dysphagia, unspecified(787.20)   . Gastroparesis   . Headache(784.0)   . Hemorrhage of rectum and anus   . Hyperlipidemia   . Hypertrophy of prostate with urinary obstruction and other lower urinary tract symptoms (LUTS)   . INTERNAL HEMORRHOIDS 10/16/2008    Qualifier: Diagnosis of  By: Nolon Rod CMA (AAMA), Robin    . Internal hemorrhoids without mention of complication   . Myocardial infarction ([place]) 2002  . Nausea alone   . Obesity   . Other dyspnea and respiratory abnormality   . Other malaise and fatigue   . Personal history of unspecified circulatory disease   . Posttraumatic stress disorder   . Prostate cancer ([place])   . Sleep apnea    USES CPAP   . Stroke [facility])    2007  . Unspecified essential hypertension    hx htn     Medications: Outpatient Encounter Medications as of 06/29/2019  Medication Sig Note  . acetaminophen (TYLENOL) 500 MG tablet Take 1,000 mg by mouth as needed for mild pain or headache.    . allopurinol (ZYLOPRIM) 100 MG tablet Take 100 mg by mouth daily.    [name] amLODipine (NORVASC) 10 MG tablet Take 10 mg by mouth daily.   [name] aspirin 81 MG tablet Take 81 mg by mouth at bedtime.  11/21/2017: Patient received additional tablets of this today  . atorvastatin (LIPITOR) 40 MG tablet Take 40 mg by mouth daily.   . clopidogrel (PLAVIX) 75 MG tablet Take 75 mg by mouth daily.    . diazepam (VALIUM) 5 MG tablet Take 1 tablet (5 mg total) by mouth at bedtime.   . furosemide (LASIX) 40 MG tablet Take 1 tablet (40 mg total) by mouth daily. (Patient taking differently: Take 40 mg by mouth 2 (two) times daily. )   . gabapentin (NEURONTIN) 300 MG capsule Take 2 capsules in AM, 1 capsule at noon and 3 capsules at bedtime (Patient taking differently: Take 3 capsules in AM, 3 capsule at noon and 3 capsules at bedtime)   . glucose blood (ACCU-CHEK AVIVA PLUS) test strip USE TO TEST BLOOD SUGAR 3 TIMES A DAY   . insulin glargine (LANTUS) 100 UNIT/ML injection Inject 75 Units into the skin 2 (two) times daily.   . Insulin Syringe-Needle U-100 (B-D INS SYR ULTRAFINE 1CC/31G) 31G X 5/16" 1 ML MISC Used to give insulin injections twice daily.   . Magnesium 200 MG TABS Take 200 mg by mouth daily with lunch.    . mycophenolate (MYFORTIC) 180 MG  EC tablet Take 180 mg by mouth 2 (two) times daily. 11/21/2017: Regimen confirmed to be accurate by the patient  . nitroGLYCERIN (NITROSTAT) 0.4 MG SL tablet PLACE 1 TABLET (0.4 MG TOTAL) UNDER THE TONGUE EVERY 5 (FIVE) MINUTES AS NEEDED FOR CHEST PAIN.   [name] nortriptyline (PAMELOR) 25 MG capsule Take 1 capsule (25 mg total) by mouth at bedtime.   [name] omeprazole (PRILOSEC) 20 MG capsule Take 1 capsule (20 mg total) by mouth 2 (two) times daily before a meal.   . Semaglutide,0.25 or 0.5MG /DOS, (OZEMPIC, 0.25 OR 0.5 MG/DOSE,) 2 MG/1.5ML SOPN Inject into the skin once a week.   . tacrolimus (PROGRAF) 1 MG capsule 5 capsules 2 times daily   . Tacrolimus 1 MG CP24 Take 4 mg by mouth 2 (two) times daily.  11/21/2017: Regimen confirmed to be accurate by the patient  . Vitamin D, Ergocalciferol, (DRISDOL) 1.25 MG (50000 UNIT)  CAPS capsule Take 50,000 Units by mouth 2 (two) times a week.    No facility-administered encounter medications on file as of 06/29/2019.    Allergies: No Known Allergies  Family History: Family History  Problem Relation Age of Onset  . Heart disease Father   . Renal Disease Father   . Dementia Mother   . Heart attack Mother   . Diabetes Sister   . Heart disease Sister   . Diabetes Maternal Aunt   . Diabetes Maternal Uncle   . Diabetes Paternal Aunt   . Diabetes Paternal Uncle   . Heart disease Other   . Heart attack Brother   . Lung cancer Sister   . Renal Disease Brother   . Ovarian cancer Cousin   . Lung cancer Cousin   . Breast cancer Neg Hx   . Colon cancer Neg Hx   . Pancreatic cancer Neg Hx   . Prostate cancer Neg Hx     Social History: Social History   Socioeconomic History  . Marital status: Married    Spouse name: Elisha Headland  . Number of children: 3  . Years of education: Not on file  . Highest education level: Associate degree: academic program  Occupational History  . Occupation: disabled    Employer: DISABLED  Tobacco Use  . Smoking status: Never  Smoker  . Smokeless tobacco: Never Used  Substance and Sexual Activity  . Alcohol use: No  . Drug use: No  . Sexual activity: Not Currently  Other Topics Concern  . Not on file  Social History Narrative   Married 5946. 41 year old son in 12. 1 granddaughter from [place].       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 He lives with his wife. He drinks 3-4 cups of coffee a day. He walks most every day.   Social Determinants of Health   Financial Resource Strain:   . Difficulty of Paying Living Expenses: Not on file  Food Insecurity:   . Worried About Charity fundraiser in the Last Year: Not on file  . Ran Out of Food in the Last Year: Not on file  Transportation Needs:   . Lack of Transportation (Medical): Not on file  . Lack of Transportation (Non-Medical): Not on file  Physical Activity:   . Days of Exercise per Week: Not on file  . Minutes of Exercise per Session: Not on file  Stress:   . Feeling of Stress : Not on file  Social Connections:   . Frequency of Communication with Friends and Family: Not on file  . Frequency of Social Gatherings with Friends and Family: Not on file  . Attends Religious Services: Not on file  . Active Member of Clubs or Organizations: Not on file  . Attends Archivist Meetings: Not on file  . Marital Status: Not on file  Intimate Partner Violence:   . Fear of Current or Ex-Partner: Not on file  . Emotionally Abused: Not on file  . Physically Abused: Not on file  . Sexually Abused: Not on file    Observations/Objective:   There were no vitals taken for this visit. No acute distress.  Alert and oriented.  Speech fluent and not dysarthric.  Language intact.  Eyes orthophoric on primary gaze.  Face symmetric.  Assessment and  Plan:   1.  Diabetic polyneuropathy 2.  Chronic low  back pain 3.  Chronic neck pain 4.  Unsteady gait, secondary to diabetic polyneuropathy.  Worse more recently due to deconditioning. 5.  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  May be secondary to gabapentin.  Plan has been to slowly taper off of gabapentin and replace with nortriptyline.  He would like to taper off of gabapentin slowly. 1.  Increase nortriptyline to 50mg  at bedtime 2.  Decrease gabapentin to 600mg  in AM, 300mg  in afternoon and 600mg  at night for now.  He will let me know if he is ready to reduce dose further. 3.  Refer to physical therapy 4.  Follow up in 5 months.   Follow Up Instructions:    -I discussed the assessment and treatment plan with the patient. The patient was provided an opportunity to ask questions and all were answered. The patient agreed with the plan and demonstrated an understanding of the instructions.   The patient was advised to call back or seek an in-person evaluation if the symptoms worsen or if the condition fails to improve as anticipated.    Dudley Major, DO

## 2019-07-17 DIAGNOSIS — Z940 Kidney transplant status: Secondary | ICD-10-CM | POA: Diagnosis not present

## 2019-07-17 DIAGNOSIS — I129 Hypertensive chronic kidney disease with stage 1 through stage 4 chronic kidney disease, or unspecified chronic kidney disease: Secondary | ICD-10-CM | POA: Diagnosis not present

## 2019-07-17 DIAGNOSIS — E785 Hyperlipidemia, unspecified: Secondary | ICD-10-CM | POA: Diagnosis not present

## 2019-07-17 DIAGNOSIS — N2581 Secondary hyperparathyroidism of renal origin: Secondary | ICD-10-CM | POA: Diagnosis not present

## 2019-07-17 DIAGNOSIS — M109 Gout, unspecified: Secondary | ICD-10-CM | POA: Diagnosis not present

## 2019-07-17 DIAGNOSIS — E559 Vitamin D deficiency, unspecified: Secondary | ICD-10-CM | POA: Diagnosis not present

## 2019-07-17 DIAGNOSIS — E1129 Type 2 diabetes mellitus with other diabetic kidney complication: Secondary | ICD-10-CM | POA: Diagnosis not present

## 2019-07-17 DIAGNOSIS — R894 Abnormal immunological findings in specimens from other organs, systems and tissues: Secondary | ICD-10-CM | POA: Diagnosis not present

## 2019-07-21 ENCOUNTER — Ambulatory Visit: Payer: Medicare Other | Attending: Neurology | Admitting: Physical Therapy

## 2019-07-24 DIAGNOSIS — Z940 Kidney transplant status: Secondary | ICD-10-CM | POA: Diagnosis not present

## 2019-07-25 ENCOUNTER — Telehealth: Payer: Self-pay | Admitting: Family Medicine

## 2019-07-25 ENCOUNTER — Other Ambulatory Visit: Payer: Self-pay

## 2019-07-25 DIAGNOSIS — E0843 Diabetes mellitus due to underlying condition with diabetic autonomic (poly)neuropathy: Secondary | ICD-10-CM

## 2019-07-25 DIAGNOSIS — Z794 Long term (current) use of insulin: Secondary | ICD-10-CM

## 2019-07-25 NOTE — Telephone Encounter (Signed)
Pt called stating he would like a referral to an endocrinologist. Please advise.

## 2019-07-25 NOTE — Telephone Encounter (Signed)
Referral to endo placed.  

## 2019-08-07 ENCOUNTER — Telehealth: Payer: Self-pay | Admitting: Family Medicine

## 2019-08-07 ENCOUNTER — Other Ambulatory Visit: Payer: Self-pay

## 2019-08-07 ENCOUNTER — Telehealth: Payer: Self-pay | Admitting: *Deleted

## 2019-08-07 DIAGNOSIS — Z794 Long term (current) use of insulin: Secondary | ICD-10-CM

## 2019-08-07 DIAGNOSIS — E0843 Diabetes mellitus due to underlying condition with diabetic autonomic (poly)neuropathy: Secondary | ICD-10-CM

## 2019-08-07 NOTE — Telephone Encounter (Signed)
Called pt - he would like to be sent somewhere besides [facility].  Someone in another office sent the referral to [facility] - so multiple offices have documented on it.  Please place a new referral for him.  Please specify [facility] in the notes.  Thank you.

## 2019-08-07 NOTE — Telephone Encounter (Signed)
Referral placed.

## 2019-08-07 NOTE — Telephone Encounter (Signed)
CALLED PATIENT TO INFORM THAT SIM APPT. HAS BEEN MOVED TO 08-18-19@ 2 PM @ [facility] AND HIS MRI WILL BE ON 08-18-19 - ARRIVAL TIME- 3:30 PM AND MRI TO BEGIN @ 4 PM @ [facility], SPOKE WITH PATIENT AND HE IS AWARE OF THESE APPTS. AND IS GOOD WITH THEM

## 2019-08-07 NOTE — Telephone Encounter (Signed)
Patient called in wanting to be referred to another endocrinologist, does not want Dr.Ellison.

## 2019-08-08 ENCOUNTER — Other Ambulatory Visit: Payer: Self-pay

## 2019-08-08 DIAGNOSIS — E0843 Diabetes mellitus due to underlying condition with diabetic autonomic (poly)neuropathy: Secondary | ICD-10-CM

## 2019-08-08 DIAGNOSIS — Z794 Long term (current) use of insulin: Secondary | ICD-10-CM

## 2019-08-08 NOTE — Telephone Encounter (Signed)
New referral entered 

## 2019-08-09 ENCOUNTER — Telehealth: Payer: Self-pay | Admitting: Neurology

## 2019-08-09 ENCOUNTER — Other Ambulatory Visit: Payer: Self-pay

## 2019-08-09 MED ORDER — NORTRIPTYLINE HCL 25 MG PO CAPS: 50.0000 mg | ORAL_CAPSULE | Freq: Every day | ORAL | 5 refills | Status: DC

## 2019-08-09 MED ORDER — GABAPENTIN 300 MG PO CAPS: ORAL_CAPSULE | ORAL | 5 refills | Status: DC

## 2019-08-09 NOTE — Telephone Encounter (Signed)
Office notes and scripts printed out to fax to the [place]

## 2019-08-09 NOTE — Telephone Encounter (Signed)
The following message was left with [facility] on 08/09/19 at 12:12 PM.  Caller states he needs to speak to Dr. Georgie Chard nurse. He gets his medication from the [place] and the notes from his previous appointment didn't get sent there.   He needs the visit notes and prescription faxed to Dr. Rondell Reams.

## 2019-08-10 ENCOUNTER — Other Ambulatory Visit: Payer: Self-pay

## 2019-08-10 ENCOUNTER — Telehealth: Payer: Self-pay | Admitting: Family Medicine

## 2019-08-10 DIAGNOSIS — Z794 Long term (current) use of insulin: Secondary | ICD-10-CM

## 2019-08-10 DIAGNOSIS — E0843 Diabetes mellitus due to underlying condition with diabetic autonomic (poly)neuropathy: Secondary | ICD-10-CM

## 2019-08-10 NOTE — Telephone Encounter (Signed)
Im going to close the new referral since I already sent it somewhere else.

## 2019-08-10 NOTE — Telephone Encounter (Signed)
New referral placed.

## 2019-08-10 NOTE — Telephone Encounter (Signed)
[facility] is calling in regards to the referral for Endocrinologist, they stated that the provider is not taking new patients who have diabetes and recommenced Alanson Aly in [place].

## 2019-08-10 NOTE — Telephone Encounter (Signed)
I was already aware of this - but I sent him to [facility].  Does he want to go to [facility] or is that who [facility] recommended?

## 2019-08-10 NOTE — Telephone Encounter (Signed)
That's who [facility] recommended.

## 2019-08-11 ENCOUNTER — Ambulatory Visit: Payer: Medicare Other | Admitting: Radiation Oncology

## 2019-08-11 ENCOUNTER — Telehealth: Payer: Self-pay | Admitting: Neurology

## 2019-08-11 NOTE — Telephone Encounter (Signed)
Pt wants to make sure if the office notes and the RX was sent over to the [place]

## 2019-08-11 NOTE — Telephone Encounter (Signed)
Sent!

## 2019-08-15 NOTE — Progress Notes (Signed)
  Radiation Oncology         [phone] ________________________________  Name: [name] MRN: [id number]  Date: 08/18/2019  DOB: May 04, 1951  SIMULATION AND TREATMENT PLANNING NOTE    ICD-10-CM   1. Malignant neoplasm of prostate ([place])  C61     DIAGNOSIS:  69 y.o. gentleman with Stage T1c adenocarcinoma of the prostate with Gleason score of 4+3, and PSA of 25.6.  NARRATIVE:  The patient was brought to the [facility].  Identity was confirmed.  All relevant records and images related to the planned course of therapy were reviewed.  The patient freely provided informed written consent to proceed with treatment after reviewing the details related to the planned course of therapy. The consent form was witnessed and verified by the simulation staff.  Then, the patient was set-up in a stable reproducible supine position for radiation therapy.  A vacuum lock pillow device was custom fabricated to position his legs in a reproducible immobilized position.  Then, I performed a urethrogram under sterile conditions to identify the prostatic bed.  CT images were obtained.  Surface markings were placed.  The CT images were loaded into the planning software.  Then the prostate bed target, pelvic lymph node target and avoidance structures including the rectum, bladder, bowel and hips were contoured.  Treatment planning then occurred.  The radiation prescription was entered and confirmed.  A total of one complex treatment devices were fabricated. I have requested : Intensity Modulated Radiotherapy (IMRT) is medically necessary for this case for the following reason:  Rectal sparing.[name]  PLAN:  The patient will receive 45 Gy in 25 fractions of 1.8 Gy, followed by a boost to the prostate to a total dose of 75 Gy with 15 additional fractions of 2 Gy.   ________________________________  Sheral Apley Tammi Klippel, M.D.

## 2019-08-17 ENCOUNTER — Telehealth: Payer: Self-pay | Admitting: *Deleted

## 2019-08-17 NOTE — Telephone Encounter (Signed)
CALLED PATIENT TO REMIND OF SIM AND MRI APPT. FOR 08-18-19, SPOKE WITH PATIENT AND HE IS AWARE OF THESE APPTS.

## 2019-08-18 ENCOUNTER — Encounter: Payer: Self-pay | Admitting: Medical Oncology

## 2019-08-18 ENCOUNTER — Ambulatory Visit
Admission: RE | Admit: 2019-08-18 | Discharge: 2019-08-18 | Disposition: A | Discharge status: Home / Self Care | Payer: Medicare Other | Source: Ambulatory Visit | Attending: Radiation Oncology | Admitting: Radiation Oncology

## 2019-08-18 ENCOUNTER — Other Ambulatory Visit: Payer: Self-pay

## 2019-08-18 ENCOUNTER — Ambulatory Visit (HOSPITAL_COMMUNITY)
Admission: RE | Admit: 2019-08-18 | Discharge: 2019-08-18 | Disposition: A | Discharge status: Home / Self Care | Payer: Medicare Other | Source: Ambulatory Visit | Attending: Urology | Admitting: Urology

## 2019-08-18 DIAGNOSIS — C61 Malignant neoplasm of prostate: Secondary | ICD-10-CM | POA: Diagnosis not present

## 2019-08-18 DIAGNOSIS — Z923 Personal history of irradiation: Secondary | ICD-10-CM | POA: Insufficient documentation

## 2019-08-27 DIAGNOSIS — C61 Malignant neoplasm of prostate: Secondary | ICD-10-CM | POA: Diagnosis present

## 2019-08-27 DIAGNOSIS — Z923 Personal history of irradiation: Secondary | ICD-10-CM | POA: Insufficient documentation

## 2019-08-28 ENCOUNTER — Ambulatory Visit: Payer: Medicare Other | Admitting: Radiation Oncology

## 2019-08-29 ENCOUNTER — Ambulatory Visit
Admission: RE | Admit: 2019-08-29 | Discharge: 2019-08-29 | Disposition: A | Discharge status: Home / Self Care | Payer: Medicare Other | Source: Ambulatory Visit | Attending: Radiation Oncology | Admitting: Radiation Oncology

## 2019-08-29 ENCOUNTER — Ambulatory Visit: Payer: Medicare Other

## 2019-08-29 ENCOUNTER — Encounter: Payer: Self-pay | Admitting: Medical Oncology

## 2019-08-29 ENCOUNTER — Other Ambulatory Visit: Payer: Self-pay

## 2019-08-29 DIAGNOSIS — C61 Malignant neoplasm of prostate: Secondary | ICD-10-CM | POA: Diagnosis not present

## 2019-08-29 DIAGNOSIS — Z923 Personal history of irradiation: Secondary | ICD-10-CM | POA: Diagnosis not present

## 2019-08-30 ENCOUNTER — Ambulatory Visit
Admission: RE | Admit: 2019-08-30 | Discharge: 2019-08-30 | Disposition: A | Discharge status: Home / Self Care | Payer: Medicare Other | Source: Ambulatory Visit | Attending: Radiation Oncology | Admitting: Radiation Oncology

## 2019-08-30 ENCOUNTER — Ambulatory Visit: Payer: Medicare Other | Admitting: Radiation Oncology

## 2019-08-30 ENCOUNTER — Other Ambulatory Visit: Payer: Self-pay

## 2019-08-30 DIAGNOSIS — C61 Malignant neoplasm of prostate: Secondary | ICD-10-CM | POA: Diagnosis not present

## 2019-08-30 DIAGNOSIS — Z923 Personal history of irradiation: Secondary | ICD-10-CM | POA: Diagnosis not present

## 2019-08-31 ENCOUNTER — Ambulatory Visit: Payer: Medicare Other

## 2019-08-31 ENCOUNTER — Ambulatory Visit
Admission: RE | Admit: 2019-08-31 | Discharge: 2019-08-31 | Disposition: A | Discharge status: Home / Self Care | Payer: Medicare Other | Source: Ambulatory Visit | Attending: Radiation Oncology | Admitting: Radiation Oncology

## 2019-08-31 ENCOUNTER — Other Ambulatory Visit: Payer: Self-pay

## 2019-08-31 DIAGNOSIS — C61 Malignant neoplasm of prostate: Secondary | ICD-10-CM | POA: Diagnosis not present

## 2019-08-31 DIAGNOSIS — Z923 Personal history of irradiation: Secondary | ICD-10-CM | POA: Diagnosis not present

## 2019-09-01 ENCOUNTER — Ambulatory Visit
Admission: RE | Admit: 2019-09-01 | Discharge: 2019-09-01 | Disposition: A | Discharge status: Home / Self Care | Payer: Medicare Other | Source: Ambulatory Visit | Attending: Radiation Oncology | Admitting: Radiation Oncology

## 2019-09-01 ENCOUNTER — Other Ambulatory Visit: Payer: Self-pay

## 2019-09-01 DIAGNOSIS — Z923 Personal history of irradiation: Secondary | ICD-10-CM | POA: Diagnosis not present

## 2019-09-01 DIAGNOSIS — C61 Malignant neoplasm of prostate: Secondary | ICD-10-CM | POA: Diagnosis not present

## 2019-09-04 ENCOUNTER — Other Ambulatory Visit: Payer: Self-pay

## 2019-09-04 ENCOUNTER — Ambulatory Visit
Admission: RE | Admit: 2019-09-04 | Discharge: 2019-09-04 | Disposition: A | Discharge status: Home / Self Care | Payer: Medicare Other | Source: Ambulatory Visit | Attending: Radiation Oncology | Admitting: Radiation Oncology

## 2019-09-04 DIAGNOSIS — Z923 Personal history of irradiation: Secondary | ICD-10-CM | POA: Diagnosis not present

## 2019-09-04 DIAGNOSIS — C61 Malignant neoplasm of prostate: Secondary | ICD-10-CM | POA: Diagnosis not present

## 2019-09-05 ENCOUNTER — Ambulatory Visit
Admission: RE | Admit: 2019-09-05 | Discharge: 2019-09-05 | Disposition: A | Discharge status: Home / Self Care | Payer: Medicare Other | Source: Ambulatory Visit | Attending: Radiation Oncology | Admitting: Radiation Oncology

## 2019-09-05 ENCOUNTER — Other Ambulatory Visit: Payer: Self-pay

## 2019-09-05 DIAGNOSIS — Z923 Personal history of irradiation: Secondary | ICD-10-CM | POA: Diagnosis not present

## 2019-09-05 DIAGNOSIS — C61 Malignant neoplasm of prostate: Secondary | ICD-10-CM | POA: Diagnosis not present

## 2019-09-06 ENCOUNTER — Ambulatory Visit
Admission: RE | Admit: 2019-09-06 | Discharge: 2019-09-06 | Disposition: A | Discharge status: Home / Self Care | Payer: Medicare Other | Source: Ambulatory Visit | Attending: Radiation Oncology | Admitting: Radiation Oncology

## 2019-09-06 ENCOUNTER — Other Ambulatory Visit: Payer: Self-pay

## 2019-09-06 DIAGNOSIS — Z923 Personal history of irradiation: Secondary | ICD-10-CM | POA: Diagnosis not present

## 2019-09-06 DIAGNOSIS — C61 Malignant neoplasm of prostate: Secondary | ICD-10-CM | POA: Diagnosis not present

## 2019-09-07 ENCOUNTER — Ambulatory Visit
Admission: RE | Admit: 2019-09-07 | Discharge: 2019-09-07 | Disposition: A | Discharge status: Home / Self Care | Payer: Medicare Other | Source: Ambulatory Visit | Attending: Radiation Oncology | Admitting: Radiation Oncology

## 2019-09-07 ENCOUNTER — Other Ambulatory Visit: Payer: Self-pay

## 2019-09-07 DIAGNOSIS — Z923 Personal history of irradiation: Secondary | ICD-10-CM | POA: Diagnosis not present

## 2019-09-07 DIAGNOSIS — C61 Malignant neoplasm of prostate: Secondary | ICD-10-CM | POA: Diagnosis not present

## 2019-09-08 ENCOUNTER — Ambulatory Visit
Admission: RE | Admit: 2019-09-08 | Discharge: 2019-09-08 | Disposition: A | Discharge status: Home / Self Care | Payer: Medicare Other | Source: Ambulatory Visit | Attending: Radiation Oncology | Admitting: Radiation Oncology

## 2019-09-08 ENCOUNTER — Other Ambulatory Visit: Payer: Self-pay

## 2019-09-08 DIAGNOSIS — Z923 Personal history of irradiation: Secondary | ICD-10-CM | POA: Diagnosis not present

## 2019-09-08 DIAGNOSIS — C61 Malignant neoplasm of prostate: Secondary | ICD-10-CM | POA: Diagnosis not present

## 2019-09-11 ENCOUNTER — Other Ambulatory Visit: Payer: Self-pay

## 2019-09-11 ENCOUNTER — Ambulatory Visit
Admission: RE | Admit: 2019-09-11 | Discharge: 2019-09-11 | Disposition: A | Discharge status: Home / Self Care | Payer: Medicare Other | Source: Ambulatory Visit | Attending: Radiation Oncology | Admitting: Radiation Oncology

## 2019-09-11 DIAGNOSIS — C61 Malignant neoplasm of prostate: Secondary | ICD-10-CM | POA: Diagnosis not present

## 2019-09-11 DIAGNOSIS — Z923 Personal history of irradiation: Secondary | ICD-10-CM | POA: Diagnosis not present

## 2019-09-12 ENCOUNTER — Other Ambulatory Visit: Payer: Self-pay

## 2019-09-12 ENCOUNTER — Ambulatory Visit
Admission: RE | Admit: 2019-09-12 | Discharge: 2019-09-12 | Disposition: A | Discharge status: Home / Self Care | Payer: Medicare Other | Source: Ambulatory Visit | Attending: Radiation Oncology | Admitting: Radiation Oncology

## 2019-09-12 DIAGNOSIS — C61 Malignant neoplasm of prostate: Secondary | ICD-10-CM | POA: Diagnosis not present

## 2019-09-12 DIAGNOSIS — Z923 Personal history of irradiation: Secondary | ICD-10-CM | POA: Diagnosis not present

## 2019-09-13 ENCOUNTER — Other Ambulatory Visit: Payer: Self-pay

## 2019-09-13 ENCOUNTER — Ambulatory Visit
Admission: RE | Admit: 2019-09-13 | Discharge: 2019-09-13 | Disposition: A | Discharge status: Home / Self Care | Payer: Medicare Other | Source: Ambulatory Visit | Attending: Radiation Oncology | Admitting: Radiation Oncology

## 2019-09-13 DIAGNOSIS — C61 Malignant neoplasm of prostate: Secondary | ICD-10-CM | POA: Diagnosis not present

## 2019-09-13 DIAGNOSIS — Z923 Personal history of irradiation: Secondary | ICD-10-CM | POA: Diagnosis not present

## 2019-09-14 ENCOUNTER — Other Ambulatory Visit: Payer: Self-pay

## 2019-09-14 ENCOUNTER — Ambulatory Visit
Admission: RE | Admit: 2019-09-14 | Discharge: 2019-09-14 | Disposition: A | Discharge status: Home / Self Care | Payer: Medicare Other | Source: Ambulatory Visit | Attending: Radiation Oncology | Admitting: Radiation Oncology

## 2019-09-14 DIAGNOSIS — C61 Malignant neoplasm of prostate: Secondary | ICD-10-CM | POA: Diagnosis not present

## 2019-09-14 DIAGNOSIS — Z923 Personal history of irradiation: Secondary | ICD-10-CM | POA: Diagnosis not present

## 2019-09-15 ENCOUNTER — Ambulatory Visit
Admission: RE | Admit: 2019-09-15 | Discharge: 2019-09-15 | Disposition: A | Discharge status: Home / Self Care | Payer: Medicare Other | Source: Ambulatory Visit | Attending: Radiation Oncology | Admitting: Radiation Oncology

## 2019-09-15 ENCOUNTER — Other Ambulatory Visit: Payer: Self-pay

## 2019-09-15 DIAGNOSIS — Z923 Personal history of irradiation: Secondary | ICD-10-CM | POA: Diagnosis not present

## 2019-09-15 DIAGNOSIS — C61 Malignant neoplasm of prostate: Secondary | ICD-10-CM | POA: Diagnosis not present

## 2019-09-18 ENCOUNTER — Other Ambulatory Visit: Payer: Self-pay

## 2019-09-18 ENCOUNTER — Ambulatory Visit
Admission: RE | Admit: 2019-09-18 | Discharge: 2019-09-18 | Disposition: A | Discharge status: Home / Self Care | Payer: Medicare Other | Source: Ambulatory Visit | Attending: Radiation Oncology | Admitting: Radiation Oncology

## 2019-09-18 DIAGNOSIS — Z923 Personal history of irradiation: Secondary | ICD-10-CM | POA: Diagnosis not present

## 2019-09-18 DIAGNOSIS — Z794 Long term (current) use of insulin: Secondary | ICD-10-CM | POA: Diagnosis not present

## 2019-09-18 DIAGNOSIS — C61 Malignant neoplasm of prostate: Secondary | ICD-10-CM | POA: Diagnosis not present

## 2019-09-18 DIAGNOSIS — E785 Hyperlipidemia, unspecified: Secondary | ICD-10-CM | POA: Diagnosis not present

## 2019-09-18 DIAGNOSIS — I2510 Atherosclerotic heart disease of native coronary artery without angina pectoris: Secondary | ICD-10-CM | POA: Diagnosis not present

## 2019-09-18 DIAGNOSIS — Z940 Kidney transplant status: Secondary | ICD-10-CM | POA: Diagnosis not present

## 2019-09-18 DIAGNOSIS — E1165 Type 2 diabetes mellitus with hyperglycemia: Secondary | ICD-10-CM | POA: Diagnosis not present

## 2019-09-18 LAB — PSA
PSA: 3.08
PSA: 3.08

## 2019-09-19 ENCOUNTER — Encounter: Payer: Self-pay | Admitting: Podiatry

## 2019-09-19 ENCOUNTER — Ambulatory Visit (INDEPENDENT_AMBULATORY_CARE_PROVIDER_SITE_OTHER): Payer: Medicare Other | Admitting: Podiatry

## 2019-09-19 ENCOUNTER — Telehealth: Payer: Self-pay | Admitting: *Deleted

## 2019-09-19 ENCOUNTER — Other Ambulatory Visit: Payer: Self-pay

## 2019-09-19 ENCOUNTER — Ambulatory Visit
Admission: RE | Admit: 2019-09-19 | Discharge: 2019-09-19 | Disposition: A | Discharge status: Home / Self Care | Payer: Medicare Other | Source: Ambulatory Visit | Attending: Radiation Oncology | Admitting: Radiation Oncology

## 2019-09-19 VITALS — Temp 97.3°F

## 2019-09-19 DIAGNOSIS — M129 Arthropathy, unspecified: Secondary | ICD-10-CM

## 2019-09-19 DIAGNOSIS — B351 Tinea unguium: Secondary | ICD-10-CM | POA: Diagnosis not present

## 2019-09-19 DIAGNOSIS — E1159 Type 2 diabetes mellitus with other circulatory complications: Secondary | ICD-10-CM | POA: Diagnosis not present

## 2019-09-19 DIAGNOSIS — Z923 Personal history of irradiation: Secondary | ICD-10-CM | POA: Diagnosis not present

## 2019-09-19 DIAGNOSIS — C61 Malignant neoplasm of prostate: Secondary | ICD-10-CM | POA: Diagnosis not present

## 2019-09-19 DIAGNOSIS — M79676 Pain in unspecified toe(s): Secondary | ICD-10-CM | POA: Diagnosis not present

## 2019-09-19 DIAGNOSIS — D689 Coagulation defect, unspecified: Secondary | ICD-10-CM

## 2019-09-19 NOTE — Progress Notes (Addendum)
This patient returns to my office for at risk foot care.  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  Patient is taking plavix.  This patient is unable to cut nails himself since the patient cannot reach his nails.These nails are painful walking and wearing shoes.  This patient presents for at risk foot care today. Patient says there is occasional shooting pain throught second toe left foot.  The second toe is swollen.  General Appearance  Alert, conversant and in no acute stress.  Vascular  Dorsalis pedis and posterior tibial  pulses are palpable  bilaterally.  Capillary return is within normal limits  bilaterally. Temperature is within normal limits  bilaterally.  Neurologic  Senn-Weinstein monofilament wire test within normal limits  bilaterally. Muscle power within normal limits bilaterally.  Nails Thick disfigured discolored nails with subungual debris  from hallux to fifth toes bilaterally. No evidence of bacterial infection or drainage bilaterally.  Orthopedic  No limitations of motion  feet .  No crepitus or effusions noted.  No bony pathology or digital deformities noted. No motion 2nd toe left foot at PIPJ.  Swelling noted but inflammationm or increased temperature.  Skin  normotropic skin with no porokeratosis noted bilaterally.  No signs of infections or ulcers noted.     Onychomycosis  Pain in right toes  Pain in left toes  Consent was obtained for treatment procedures.   Mechanical debridement of nails 1-5  bilaterally performed with a nail nipper.  Filed with dremel without incident. Discussed second toe with lack of motion in PIPJ second toe left.   Return office visit  3 months                    Told patient to return for periodic foot care and evaluation due to potential at risk complications.   Gardiner Barefoot DPM

## 2019-09-19 NOTE — Telephone Encounter (Signed)
CALLED PATIENT TO INFORM OF NUTRITION APPT. FOR 09-20-19 @ 10:30 AM WITH BARBARA NEFF, SPOKE WITH PATIENT AND HE IS AWARE OF THIS APPT.

## 2019-09-19 NOTE — Progress Notes (Signed)
Received voicemail message from patient requesting a nutrition consult. Patient is actively receiving radiation therapy for prostate cancer. Also, the patient has a history of diabetes. Deferred this to [facility]. She will arrange consultation and phone the patient at [phone].

## 2019-09-20 ENCOUNTER — Inpatient Hospital Stay: Payer: Medicare Other | Attending: Radiation Oncology | Admitting: Nutrition

## 2019-09-20 ENCOUNTER — Ambulatory Visit
Admission: RE | Admit: 2019-09-20 | Discharge: 2019-09-20 | Disposition: A | Discharge status: Home / Self Care | Payer: Medicare Other | Source: Ambulatory Visit | Attending: Radiation Oncology | Admitting: Radiation Oncology

## 2019-09-20 ENCOUNTER — Other Ambulatory Visit: Payer: Self-pay

## 2019-09-20 DIAGNOSIS — C61 Malignant neoplasm of prostate: Secondary | ICD-10-CM | POA: Diagnosis not present

## 2019-09-20 DIAGNOSIS — Z923 Personal history of irradiation: Secondary | ICD-10-CM | POA: Diagnosis not present

## 2019-09-20 NOTE — Progress Notes (Signed)
69 year old male diagnosed with prostate cancer receiving 25 fractions of radiation therapy.  He is a patient of Dr. Tammi Klippel.  Past medical history includes hypertension, stroke, MI, hyperlipidemia, gastroparesis, dysphagia, DM, chronic kidney disease stage III, and CHF.  Medications include Lasix, insulin, magnesium, Prilosec, vitamin D.  Labs were reviewed.  Height: 5 feet 10 inches. Weight: 242 pounds April 23. Usual body weight: 282 pounds 6 months ago per patient.  Weight loss was intentional per patient. BMI: 34.23.  Patient reports he was given a book called radiation and you by the radiation team.  He reviewed the nutrition section and reports he is confused by the information. He reports he has been having looser stools 2-3 a day however, reports he feels constipated and it took him all day to have a bowel movement yesterday. Denies other nutrition impact symptoms.  Nutrition diagnosis: Food and nutrition related knowledge deficit related to prostate cancer and associated treatments as evidenced by no prior need for nutrition related information.  Intervention: Educated patient on healthy plant-based diet for overall health and wellness. Explained dietary modifications based on nutrition impact symptoms such as diarrhea versus constipation. Reviewed importance of lower fiber diet if patient is experiencing diarrhea. Provided additional written information along with contact number.  Patient reports understanding and appreciation of nutrition information.  Monitoring, evaluation, goals: Patient will tolerate healthy plant-based diet.  No follow-up required patient has my contact information.  **Disclaimer: This note was dictated with voice recognition software. Similar sounding words can inadvertently be transcribed and this note may contain transcription errors which may not have been corrected upon publication of note.**

## 2019-09-21 ENCOUNTER — Other Ambulatory Visit: Payer: Self-pay

## 2019-09-21 ENCOUNTER — Ambulatory Visit: Payer: Medicare Other

## 2019-09-21 ENCOUNTER — Ambulatory Visit
Admission: RE | Admit: 2019-09-21 | Discharge: 2019-09-21 | Disposition: A | Discharge status: Home / Self Care | Payer: Medicare Other | Source: Ambulatory Visit | Attending: Radiation Oncology | Admitting: Radiation Oncology

## 2019-09-21 DIAGNOSIS — C61 Malignant neoplasm of prostate: Secondary | ICD-10-CM | POA: Diagnosis not present

## 2019-09-21 DIAGNOSIS — Z923 Personal history of irradiation: Secondary | ICD-10-CM | POA: Diagnosis not present

## 2019-09-22 ENCOUNTER — Other Ambulatory Visit: Payer: Self-pay

## 2019-09-22 ENCOUNTER — Ambulatory Visit
Admission: RE | Admit: 2019-09-22 | Discharge: 2019-09-22 | Disposition: A | Discharge status: Home / Self Care | Payer: Medicare Other | Source: Ambulatory Visit | Attending: Radiation Oncology | Admitting: Radiation Oncology

## 2019-09-22 DIAGNOSIS — C61 Malignant neoplasm of prostate: Secondary | ICD-10-CM | POA: Diagnosis not present

## 2019-09-22 DIAGNOSIS — Z923 Personal history of irradiation: Secondary | ICD-10-CM | POA: Diagnosis not present

## 2019-09-25 ENCOUNTER — Other Ambulatory Visit: Payer: Self-pay

## 2019-09-25 ENCOUNTER — Ambulatory Visit
Admission: RE | Admit: 2019-09-25 | Discharge: 2019-09-25 | Disposition: A | Discharge status: Home / Self Care | Payer: Medicare Other | Source: Ambulatory Visit | Attending: Radiation Oncology | Admitting: Radiation Oncology

## 2019-09-25 DIAGNOSIS — Z923 Personal history of irradiation: Secondary | ICD-10-CM | POA: Diagnosis not present

## 2019-09-25 DIAGNOSIS — C61 Malignant neoplasm of prostate: Secondary | ICD-10-CM | POA: Insufficient documentation

## 2019-09-26 ENCOUNTER — Encounter: Payer: Self-pay | Admitting: Medical Oncology

## 2019-09-26 ENCOUNTER — Ambulatory Visit
Admission: RE | Admit: 2019-09-26 | Discharge: 2019-09-26 | Disposition: A | Discharge status: Home / Self Care | Payer: Medicare Other | Source: Ambulatory Visit | Attending: Radiation Oncology | Admitting: Radiation Oncology

## 2019-09-26 ENCOUNTER — Other Ambulatory Visit: Payer: Self-pay

## 2019-09-26 DIAGNOSIS — Z923 Personal history of irradiation: Secondary | ICD-10-CM | POA: Diagnosis not present

## 2019-09-26 DIAGNOSIS — C61 Malignant neoplasm of prostate: Secondary | ICD-10-CM | POA: Diagnosis not present

## 2019-09-27 ENCOUNTER — Encounter: Payer: Self-pay | Admitting: Family Medicine

## 2019-09-27 ENCOUNTER — Other Ambulatory Visit: Payer: Self-pay

## 2019-09-27 ENCOUNTER — Ambulatory Visit
Admission: RE | Admit: 2019-09-27 | Discharge: 2019-09-27 | Disposition: A | Discharge status: Home / Self Care | Payer: Medicare Other | Source: Ambulatory Visit | Attending: Radiation Oncology | Admitting: Radiation Oncology

## 2019-09-27 DIAGNOSIS — Z923 Personal history of irradiation: Secondary | ICD-10-CM | POA: Diagnosis not present

## 2019-09-27 DIAGNOSIS — C61 Malignant neoplasm of prostate: Secondary | ICD-10-CM | POA: Diagnosis not present

## 2019-09-27 DIAGNOSIS — E118 Type 2 diabetes mellitus with unspecified complications: Secondary | ICD-10-CM | POA: Diagnosis not present

## 2019-09-28 ENCOUNTER — Other Ambulatory Visit: Payer: Self-pay

## 2019-09-28 ENCOUNTER — Ambulatory Visit
Admission: RE | Admit: 2019-09-28 | Discharge: 2019-09-28 | Disposition: A | Discharge status: Home / Self Care | Payer: Medicare Other | Source: Ambulatory Visit | Attending: Radiation Oncology | Admitting: Radiation Oncology

## 2019-09-28 DIAGNOSIS — C61 Malignant neoplasm of prostate: Secondary | ICD-10-CM | POA: Diagnosis not present

## 2019-09-28 DIAGNOSIS — Z923 Personal history of irradiation: Secondary | ICD-10-CM | POA: Diagnosis not present

## 2019-09-29 ENCOUNTER — Ambulatory Visit
Admission: RE | Admit: 2019-09-29 | Discharge: 2019-09-29 | Disposition: A | Discharge status: Home / Self Care | Payer: Medicare Other | Source: Ambulatory Visit | Attending: Radiation Oncology | Admitting: Radiation Oncology

## 2019-09-29 ENCOUNTER — Other Ambulatory Visit: Payer: Self-pay

## 2019-09-29 DIAGNOSIS — C61 Malignant neoplasm of prostate: Secondary | ICD-10-CM | POA: Diagnosis not present

## 2019-09-29 DIAGNOSIS — Z923 Personal history of irradiation: Secondary | ICD-10-CM | POA: Diagnosis not present

## 2019-10-02 ENCOUNTER — Ambulatory Visit
Admission: RE | Admit: 2019-10-02 | Discharge: 2019-10-02 | Disposition: A | Discharge status: Home / Self Care | Payer: Medicare Other | Source: Ambulatory Visit | Attending: Radiation Oncology | Admitting: Radiation Oncology

## 2019-10-02 ENCOUNTER — Other Ambulatory Visit: Payer: Self-pay

## 2019-10-02 DIAGNOSIS — Z923 Personal history of irradiation: Secondary | ICD-10-CM | POA: Diagnosis not present

## 2019-10-02 DIAGNOSIS — C61 Malignant neoplasm of prostate: Secondary | ICD-10-CM | POA: Diagnosis not present

## 2019-10-03 ENCOUNTER — Ambulatory Visit
Admission: RE | Admit: 2019-10-03 | Discharge: 2019-10-03 | Disposition: A | Discharge status: Home / Self Care | Payer: Medicare Other | Source: Ambulatory Visit | Attending: Radiation Oncology | Admitting: Radiation Oncology

## 2019-10-03 ENCOUNTER — Other Ambulatory Visit: Payer: Self-pay

## 2019-10-03 DIAGNOSIS — Z923 Personal history of irradiation: Secondary | ICD-10-CM | POA: Diagnosis not present

## 2019-10-03 DIAGNOSIS — C61 Malignant neoplasm of prostate: Secondary | ICD-10-CM | POA: Diagnosis not present

## 2019-10-04 ENCOUNTER — Other Ambulatory Visit: Payer: Self-pay

## 2019-10-04 ENCOUNTER — Ambulatory Visit
Admission: RE | Admit: 2019-10-04 | Discharge: 2019-10-04 | Disposition: A | Discharge status: Home / Self Care | Payer: Medicare Other | Source: Ambulatory Visit | Attending: Radiation Oncology | Admitting: Radiation Oncology

## 2019-10-04 DIAGNOSIS — Z923 Personal history of irradiation: Secondary | ICD-10-CM | POA: Diagnosis not present

## 2019-10-04 DIAGNOSIS — C61 Malignant neoplasm of prostate: Secondary | ICD-10-CM | POA: Diagnosis not present

## 2019-10-05 ENCOUNTER — Ambulatory Visit
Admission: RE | Admit: 2019-10-05 | Discharge: 2019-10-05 | Disposition: A | Discharge status: Home / Self Care | Payer: Medicare Other | Source: Ambulatory Visit | Attending: Radiation Oncology | Admitting: Radiation Oncology

## 2019-10-05 ENCOUNTER — Other Ambulatory Visit: Payer: Self-pay

## 2019-10-05 DIAGNOSIS — C61 Malignant neoplasm of prostate: Secondary | ICD-10-CM | POA: Diagnosis not present

## 2019-10-05 DIAGNOSIS — Z923 Personal history of irradiation: Secondary | ICD-10-CM | POA: Diagnosis not present

## 2019-10-06 ENCOUNTER — Ambulatory Visit
Admission: RE | Admit: 2019-10-06 | Discharge: 2019-10-06 | Disposition: A | Discharge status: Home / Self Care | Payer: Medicare Other | Source: Ambulatory Visit | Attending: Radiation Oncology | Admitting: Radiation Oncology

## 2019-10-06 ENCOUNTER — Other Ambulatory Visit: Payer: Self-pay

## 2019-10-06 DIAGNOSIS — C61 Malignant neoplasm of prostate: Secondary | ICD-10-CM | POA: Diagnosis not present

## 2019-10-06 DIAGNOSIS — Z923 Personal history of irradiation: Secondary | ICD-10-CM | POA: Diagnosis not present

## 2019-10-09 ENCOUNTER — Other Ambulatory Visit: Payer: Self-pay

## 2019-10-09 ENCOUNTER — Ambulatory Visit
Admission: RE | Admit: 2019-10-09 | Discharge: 2019-10-09 | Disposition: A | Discharge status: Home / Self Care | Payer: Medicare Other | Source: Ambulatory Visit | Attending: Radiation Oncology | Admitting: Radiation Oncology

## 2019-10-09 DIAGNOSIS — Z923 Personal history of irradiation: Secondary | ICD-10-CM | POA: Diagnosis not present

## 2019-10-09 DIAGNOSIS — C61 Malignant neoplasm of prostate: Secondary | ICD-10-CM | POA: Diagnosis not present

## 2019-10-10 ENCOUNTER — Other Ambulatory Visit: Payer: Self-pay

## 2019-10-10 ENCOUNTER — Ambulatory Visit
Admission: RE | Admit: 2019-10-10 | Discharge: 2019-10-10 | Disposition: A | Discharge status: Home / Self Care | Payer: Medicare Other | Source: Ambulatory Visit | Attending: Radiation Oncology | Admitting: Radiation Oncology

## 2019-10-10 DIAGNOSIS — Z923 Personal history of irradiation: Secondary | ICD-10-CM | POA: Diagnosis not present

## 2019-10-10 DIAGNOSIS — C61 Malignant neoplasm of prostate: Secondary | ICD-10-CM | POA: Diagnosis not present

## 2019-10-11 ENCOUNTER — Other Ambulatory Visit: Payer: Self-pay

## 2019-10-11 ENCOUNTER — Ambulatory Visit
Admission: RE | Admit: 2019-10-11 | Discharge: 2019-10-11 | Disposition: A | Discharge status: Home / Self Care | Payer: Medicare Other | Source: Ambulatory Visit | Attending: Radiation Oncology | Admitting: Radiation Oncology

## 2019-10-11 DIAGNOSIS — C61 Malignant neoplasm of prostate: Secondary | ICD-10-CM | POA: Diagnosis not present

## 2019-10-11 DIAGNOSIS — R3912 Poor urinary stream: Secondary | ICD-10-CM | POA: Diagnosis not present

## 2019-10-11 DIAGNOSIS — Z923 Personal history of irradiation: Secondary | ICD-10-CM | POA: Diagnosis not present

## 2019-10-12 ENCOUNTER — Ambulatory Visit
Admission: RE | Admit: 2019-10-12 | Discharge: 2019-10-12 | Disposition: A | Discharge status: Home / Self Care | Payer: Medicare Other | Source: Ambulatory Visit | Attending: Radiation Oncology | Admitting: Radiation Oncology

## 2019-10-12 ENCOUNTER — Other Ambulatory Visit: Payer: Medicare Other

## 2019-10-12 ENCOUNTER — Ambulatory Visit: Payer: Medicare Other

## 2019-10-12 ENCOUNTER — Other Ambulatory Visit: Payer: Self-pay

## 2019-10-12 DIAGNOSIS — Z923 Personal history of irradiation: Secondary | ICD-10-CM | POA: Diagnosis not present

## 2019-10-12 DIAGNOSIS — C61 Malignant neoplasm of prostate: Secondary | ICD-10-CM | POA: Diagnosis not present

## 2019-10-13 ENCOUNTER — Ambulatory Visit
Admission: RE | Admit: 2019-10-13 | Discharge: 2019-10-13 | Disposition: A | Discharge status: Home / Self Care | Payer: Medicare Other | Source: Ambulatory Visit | Attending: Radiation Oncology | Admitting: Radiation Oncology

## 2019-10-13 ENCOUNTER — Other Ambulatory Visit: Payer: Self-pay

## 2019-10-13 DIAGNOSIS — E559 Vitamin D deficiency, unspecified: Secondary | ICD-10-CM | POA: Diagnosis not present

## 2019-10-13 DIAGNOSIS — M109 Gout, unspecified: Secondary | ICD-10-CM | POA: Diagnosis not present

## 2019-10-13 DIAGNOSIS — I129 Hypertensive chronic kidney disease with stage 1 through stage 4 chronic kidney disease, or unspecified chronic kidney disease: Secondary | ICD-10-CM | POA: Diagnosis not present

## 2019-10-13 DIAGNOSIS — C61 Malignant neoplasm of prostate: Secondary | ICD-10-CM | POA: Diagnosis not present

## 2019-10-13 DIAGNOSIS — Z923 Personal history of irradiation: Secondary | ICD-10-CM | POA: Diagnosis not present

## 2019-10-13 DIAGNOSIS — Z940 Kidney transplant status: Secondary | ICD-10-CM | POA: Diagnosis not present

## 2019-10-16 ENCOUNTER — Ambulatory Visit
Admission: RE | Admit: 2019-10-16 | Discharge: 2019-10-16 | Disposition: A | Discharge status: Home / Self Care | Payer: Medicare Other | Source: Ambulatory Visit | Attending: Radiation Oncology | Admitting: Radiation Oncology

## 2019-10-16 ENCOUNTER — Other Ambulatory Visit: Payer: Self-pay

## 2019-10-16 ENCOUNTER — Other Ambulatory Visit: Payer: Medicare Other

## 2019-10-16 DIAGNOSIS — C61 Malignant neoplasm of prostate: Secondary | ICD-10-CM | POA: Diagnosis not present

## 2019-10-16 DIAGNOSIS — Z923 Personal history of irradiation: Secondary | ICD-10-CM | POA: Diagnosis not present

## 2019-10-17 ENCOUNTER — Ambulatory Visit
Admission: RE | Admit: 2019-10-17 | Discharge: 2019-10-17 | Disposition: A | Discharge status: Home / Self Care | Payer: Medicare Other | Source: Ambulatory Visit | Attending: Radiation Oncology | Admitting: Radiation Oncology

## 2019-10-17 ENCOUNTER — Other Ambulatory Visit: Payer: Self-pay

## 2019-10-17 DIAGNOSIS — Z923 Personal history of irradiation: Secondary | ICD-10-CM | POA: Diagnosis not present

## 2019-10-17 DIAGNOSIS — C61 Malignant neoplasm of prostate: Secondary | ICD-10-CM | POA: Diagnosis not present

## 2019-10-18 ENCOUNTER — Ambulatory Visit
Admission: RE | Admit: 2019-10-18 | Discharge: 2019-10-18 | Disposition: A | Discharge status: Home / Self Care | Payer: Medicare Other | Source: Ambulatory Visit | Attending: Radiation Oncology | Admitting: Radiation Oncology

## 2019-10-18 ENCOUNTER — Other Ambulatory Visit: Payer: Self-pay

## 2019-10-18 DIAGNOSIS — Z940 Kidney transplant status: Secondary | ICD-10-CM | POA: Diagnosis not present

## 2019-10-18 DIAGNOSIS — E1165 Type 2 diabetes mellitus with hyperglycemia: Secondary | ICD-10-CM | POA: Diagnosis not present

## 2019-10-18 DIAGNOSIS — I2510 Atherosclerotic heart disease of native coronary artery without angina pectoris: Secondary | ICD-10-CM | POA: Diagnosis not present

## 2019-10-18 DIAGNOSIS — Z794 Long term (current) use of insulin: Secondary | ICD-10-CM | POA: Diagnosis not present

## 2019-10-18 DIAGNOSIS — E785 Hyperlipidemia, unspecified: Secondary | ICD-10-CM | POA: Diagnosis not present

## 2019-10-18 DIAGNOSIS — C61 Malignant neoplasm of prostate: Secondary | ICD-10-CM | POA: Diagnosis not present

## 2019-10-18 DIAGNOSIS — Z923 Personal history of irradiation: Secondary | ICD-10-CM | POA: Diagnosis not present

## 2019-10-18 LAB — HEMOGLOBIN A1C: Hemoglobin A1C: 8.3

## 2019-10-19 ENCOUNTER — Ambulatory Visit
Admission: RE | Admit: 2019-10-19 | Discharge: 2019-10-19 | Disposition: A | Discharge status: Home / Self Care | Payer: Medicare Other | Source: Ambulatory Visit | Attending: Radiation Oncology | Admitting: Radiation Oncology

## 2019-10-19 ENCOUNTER — Other Ambulatory Visit: Payer: Self-pay

## 2019-10-19 DIAGNOSIS — C61 Malignant neoplasm of prostate: Secondary | ICD-10-CM | POA: Diagnosis not present

## 2019-10-19 DIAGNOSIS — I129 Hypertensive chronic kidney disease with stage 1 through stage 4 chronic kidney disease, or unspecified chronic kidney disease: Secondary | ICD-10-CM | POA: Diagnosis not present

## 2019-10-19 DIAGNOSIS — Z940 Kidney transplant status: Secondary | ICD-10-CM | POA: Diagnosis not present

## 2019-10-19 DIAGNOSIS — E785 Hyperlipidemia, unspecified: Secondary | ICD-10-CM | POA: Diagnosis not present

## 2019-10-19 DIAGNOSIS — Z923 Personal history of irradiation: Secondary | ICD-10-CM | POA: Diagnosis not present

## 2019-10-19 DIAGNOSIS — N2581 Secondary hyperparathyroidism of renal origin: Secondary | ICD-10-CM | POA: Diagnosis not present

## 2019-10-19 DIAGNOSIS — E1129 Type 2 diabetes mellitus with other diabetic kidney complication: Secondary | ICD-10-CM | POA: Diagnosis not present

## 2019-10-20 ENCOUNTER — Ambulatory Visit
Admission: RE | Admit: 2019-10-20 | Discharge: 2019-10-20 | Disposition: A | Discharge status: Home / Self Care | Payer: Medicare Other | Source: Ambulatory Visit | Attending: Radiation Oncology | Admitting: Radiation Oncology

## 2019-10-20 ENCOUNTER — Other Ambulatory Visit: Payer: Self-pay

## 2019-10-20 DIAGNOSIS — C61 Malignant neoplasm of prostate: Secondary | ICD-10-CM | POA: Diagnosis not present

## 2019-10-20 DIAGNOSIS — Z923 Personal history of irradiation: Secondary | ICD-10-CM | POA: Diagnosis not present

## 2019-10-23 ENCOUNTER — Ambulatory Visit: Payer: Medicare Other

## 2019-10-24 ENCOUNTER — Encounter: Payer: Self-pay | Admitting: Radiation Oncology

## 2019-10-24 ENCOUNTER — Encounter: Payer: Self-pay | Admitting: Medical Oncology

## 2019-10-24 ENCOUNTER — Other Ambulatory Visit: Payer: Self-pay

## 2019-10-24 ENCOUNTER — Ambulatory Visit: Payer: Medicare Other

## 2019-10-24 ENCOUNTER — Ambulatory Visit
Admission: RE | Admit: 2019-10-24 | Discharge: 2019-10-24 | Disposition: A | Discharge status: Home / Self Care | Payer: Medicare Other | Source: Ambulatory Visit | Attending: Radiation Oncology | Admitting: Radiation Oncology

## 2019-10-24 DIAGNOSIS — Z923 Personal history of irradiation: Secondary | ICD-10-CM | POA: Insufficient documentation

## 2019-10-24 DIAGNOSIS — C61 Malignant neoplasm of prostate: Secondary | ICD-10-CM | POA: Diagnosis present

## 2019-10-27 ENCOUNTER — Telehealth: Payer: Self-pay | Admitting: *Deleted

## 2019-10-27 NOTE — Telephone Encounter (Signed)
Spoke with patient regarding appointment scheduled Monday 11/13/19 at 12:40 pm at [facility] chest without contrast---arrive at 12:20 pm for check in.  Patient voiced his understanding

## 2019-10-28 DIAGNOSIS — E118 Type 2 diabetes mellitus with unspecified complications: Secondary | ICD-10-CM | POA: Diagnosis not present

## 2019-11-03 ENCOUNTER — Telehealth: Payer: Self-pay | Admitting: *Deleted

## 2019-11-03 ENCOUNTER — Telehealth: Payer: Self-pay | Admitting: Neurology

## 2019-11-03 NOTE — Telephone Encounter (Signed)
Left message with after hours service on 11-03-19 @ 12:53 pm   Caller states he has left hip, back pain and left foot pain. States it is the same thing he has been treated for  [employer] it is worsening at night and he is on Gabapentin for it  He thinks it's a pinched nerve in his pain. States pain started in his lower back and runs all the way down to his foot. He reports no fever   Please call

## 2019-11-03 NOTE — Telephone Encounter (Signed)
Pt called complaining with nerve pain on hip running down leg and foot, affecting movement. Taking Gabapentin and not helping. Pt stated call neurosergon and was told to go to ER. Pt requesting advise from PCP first.

## 2019-11-04 NOTE — Telephone Encounter (Signed)
If it is similar to prior pain I think coming in for office visit is reasonable as long as no new leg weakness, new incontinence, numbness/tingling in groin . My only concern is the leg movement note which could mean he is having weakness

## 2019-11-06 ENCOUNTER — Other Ambulatory Visit: Payer: Self-pay

## 2019-11-06 MED ORDER — NORTRIPTYLINE HCL 75 MG PO CAPS: 75.0000 mg | ORAL_CAPSULE | Freq: Every day | ORAL | 0 refills | Status: DC

## 2019-11-06 NOTE — Telephone Encounter (Signed)
Last visit, I increased nortriptyline to 50mg  at bedtime.  We can increase that to 75mg  at bedtime for a week, then 100mg  at bedtime.

## 2019-11-06 NOTE — Telephone Encounter (Signed)
Pt advised of note.

## 2019-11-06 NOTE — Telephone Encounter (Signed)
Patient is scheduled for tomorrow at 8 am.

## 2019-11-06 NOTE — Telephone Encounter (Signed)
Looks like patient talked to neurology office. If still would like to evaluation please make appointment.

## 2019-11-07 ENCOUNTER — Ambulatory Visit: Payer: Medicare Other | Admitting: Family Medicine

## 2019-11-07 DIAGNOSIS — Z0289 Encounter for other administrative examinations: Secondary | ICD-10-CM

## 2019-11-07 NOTE — Progress Notes (Deleted)
Phone [phone] In person visit   Subjective:   [name] is a 69 y.o. year old very pleasant male patient who presents for/with See problem oriented charting No chief complaint on file.   This visit occurred during the SARS-CoV-2 public health emergency.  Safety protocols were in place, including screening questions prior to the visit, additional usage of staff PPE, and extensive cleaning of exam room while observing appropriate contact time as indicated for disinfecting solutions.   Past Medical History-  Patient Active Problem List   Diagnosis Date Noted  . PAD (peripheral artery disease) ([place]) 01/16/2019    Priority: High  . Morbid obesity ([place]) 12/04/2017    Priority: High  . 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 Renal transplant recipient 06/27/2015    Priority: High  . Ascending aorta dilatation-4.7 cm per [facility] 2014 10/23/2014    Priority: High  . Diabetic neuropathy ([place]) 10/14/2010    Priority: High  . CAD -s/p DES to marginal vessel at [facility] 04/09/2009    Priority: High  . Diabetes mellitus due to underlying condition with diabetic autonomic (poly)neuropathy ([place]) 09/05/2007    Priority: High  . Hyperlipidemia 01/16/2016    Priority: Medium  . Gout 12/22/2009    Priority: Medium  . Obstructive sleep apnea 07/10/2009    Priority: Medium  . BPH (benign prostatic hyperplasia) 09/25/2008    Priority: Medium  . Low back pain 12/15/2007    Priority: Medium  . Posttraumatic stress disorder 11/03/2007    Priority: Medium  . Essential hypertension 01/17/2007    Priority: Medium  . CVA (cerebral infarction) 01/17/2007    Priority: Medium  . GERD (gastroesophageal reflux disease) 05/16/2014    Priority: Low  . Trigger thumb of right hand 01/03/2014    Priority: Low  . Left foot drop 05/02/2013    Priority: Low  . Risk for falls 01/22/2012    Priority: Low  . Gastroparesis  06/19/2008    Priority: Low  . Allergic rhinitis 03/01/2007    Priority: Low  . Chronic arthropathy 09/19/2019  . Malignant neoplasm of prostate ([place]) 06/09/2019  . Coronary artery disease of native artery of native heart with stable angina pectoris ([place]) 01/16/2019  . Spinal stenosis of lumbar region 09/21/2018  . Mild cognitive impairment 06/18/2016  . Sore throat 03/19/2016  . Other viral infections of unspecified site 02/20/2016  . Fall 01/11/2016  . Fever 01/11/2016  . Tremor 08/15/2015  . Peripheral neuropathy 07/16/2015  . Fluid overload 07/12/2015  . Immunosuppression ([place]) 06/27/2015  . Orthostatic hypotension 01/15/2015  . Fall at home 01/15/2015  . Abnormal CT scan, kidney 04/03/2013  . ED (erectile dysfunction) 04/03/2013  . History of CVA (cerebrovascular accident) 04/03/2013  . History of heart artery stent 04/03/2013  . Obesity (BMI 30-39.9) 04/03/2013  . H/O iron deficiency anemia 02/10/2012  . Secondary hyperparathyroidism ([place]) 02/10/2012    Medications- reviewed and updated Current Outpatient Medications  Medication Sig Dispense Refill  . acetaminophen (TYLENOL) 500 MG tablet Take 1,000 mg by mouth as needed for mild pain or headache.     . allopurinol (ZYLOPRIM) 100 MG tablet Take 100 mg by mouth daily.     [name] amLODipine (NORVASC) 10 MG tablet Take 10 mg by mouth daily.    [name] aspirin 81 MG tablet Take 81 mg by mouth at bedtime.     [name] atorvastatin (LIPITOR) 40 MG tablet Take 40 mg by mouth daily.    [name]  clopidogrel (PLAVIX) 75 MG tablet Take 75 mg by mouth daily.     . diazepam (VALIUM) 5 MG tablet Take 1 tablet (5 mg total) by mouth at bedtime. 30 tablet 0  . furosemide (LASIX) 40 MG tablet Take 1 tablet (40 mg total) by mouth daily. (Patient taking differently: Take 40 mg by mouth 2 (two) times daily. ) 30 tablet   . gabapentin (NEURONTIN) 300 MG capsule Take 2 capsules in morning, 1 capsule in afternoon, and 2 capsules at night. 150 capsule 5  . glucose blood  (ACCU-CHEK AVIVA PLUS) test strip USE TO TEST BLOOD SUGAR 3 TIMES A DAY 300 strip 1  . insulin aspart protamine- aspart (NOVOLOG MIX 70/30) (70-30) 100 UNIT/ML injection Inject 75 Units into the skin 2 (two) times daily with a meal.     . Insulin Syringe-Needle U-100 (B-D INS SYR ULTRAFINE 1CC/31G) 31G X 5/16" 1 ML MISC Used to give insulin injections twice daily. 100 each 11  . Magnesium 200 MG TABS Take 200 mg by mouth daily with lunch.     . mycophenolate (MYFORTIC) 180 MG EC tablet Take 180 mg by mouth 2 (two) times daily.    . nitroGLYCERIN (NITROSTAT) 0.4 MG SL tablet PLACE 1 TABLET (0.4 MG TOTAL) UNDER THE TONGUE EVERY 5 (FIVE) MINUTES AS NEEDED FOR CHEST PAIN. 25 tablet 2  . nortriptyline (PAMELOR) 75 MG capsule Take 1 capsule (75 mg total) by mouth at bedtime. Take 75mg  at bedtime for a week, then 100mg  at bedtime. 60 capsule 0  . omeprazole (PRILOSEC) 20 MG capsule Take 1 capsule (20 mg total) by mouth 2 (two) times daily before a meal. 180 capsule 3  . Semaglutide,0.25 or 0.5MG /DOS, (OZEMPIC, 0.25 OR 0.5 MG/DOSE,) 2 MG/1.5ML SOPN Inject 1 mg into the skin once a week.     . tacrolimus (PROGRAF) 5 MG capsule 5 mg 2 (two) times daily.     . Vitamin D, Ergocalciferol, (DRISDOL) 1.25 MG (50000 UNIT) CAPS capsule Take 50,000 Units by mouth 2 (two) times a week.     No current facility-administered medications for this visit.     Objective:  There were no vitals taken for this visit. Gen: NAD, resting comfortably CV: RRR no murmurs rubs or gallops Lungs: CTAB no crackles, wheeze, rhonchi Abdomen: soft/nontender/nondistended/normal bowel sounds. No rebound or guarding.  Ext: no edema Skin: warm, dry Neuro: grossly normal, moves all extremities  ***    Assessment and Plan   #Concern for radicular pain S: Today:***Patient reports Dr. Tomi Likens had increased nortriptyline to 75 mg at bedtime for a week and increase to 100 mg   History: Patient with chronic issues with back pain likely  related to lumbar stenosis.  Last year in April was having a flare with his left leg and left low back-gabapentin was increased to 900 mg 3 times a day.  He was trying to avoid using a cane or walker at that time.  He was later decreased in March 2021 gabapentin 600 mg in the morning, 30 mg in the afternoon and 600 mg in the evening.  From 08/18/2018 note with Dr. Tomi Likens "1.Diabetic polyneuropathy 2.Lumbosacral radiculopathy. I suspect the right leg pain and numbness was likely due to a radiculopathy. He has known lumbar disc bulges and neuroforaminal narrowing irritating nerve roots. He didn't have any other features to suspect TIA."  Had mri at [facility] in 2017. Injections in the past have been helpful for up to 3 months- this was ordered through  neurology.  Results  "1. Multilevel lumbar spondylosis, most advanced at L5-S1 where there is mild canal stenosis, bilateral lateral recess stenosis and moderate left greater than right foraminal stenosis secondary to a left eccentric disc bulge. 2. Bilateral lateral recess and mild left greater than right foraminal stenosis at L4-L5 secondary to a left eccentric disc bulge. 3. Bilateral renal lesions, better evaluated by prior MR and CT abdomen."  A/P: ***     ***  Lab Results  Component Value Date   HGBA1C 8.3 10/18/2019   HGBA1C 10.5 (H) 01/16/2019   HGBA1C 9.0 (H) 06/03/2018  ***Renal transplant recipient   ***Stable 4.8 cm descending thoracic aortic aneurysm done November 2020 with repeat planned.  Also had aortic atherosclerosis and CAD noted***     Recommended follow up: ***No follow-ups on file. Future Appointments  Date Time Provider [place]  11/13/2019 12:40 PM [facility] 1 [facility] [address] [facility]  11/28/2019  1:30 PM Pieter Partridge, DO [facility] None  11/30/2019  2:00 PM Bruning, Ashlyn, PA-C [facility] None    Lab/Order associations: No diagnosis found.  No orders of the defined types were placed in this  encounter.   Time Spent: *** minutes of total time (7:58 AM***- 7:58 AM***) was spent on the date of the encounter performing the following actions: chart review prior to seeing the patient, obtaining history, performing a medically necessary exam, counseling on the treatment plan, placing orders, and documenting in our EHR.   Return precautions advised.  Garret Reddish, MD

## 2019-11-08 DIAGNOSIS — Z794 Long term (current) use of insulin: Secondary | ICD-10-CM | POA: Diagnosis not present

## 2019-11-08 DIAGNOSIS — Z940 Kidney transplant status: Secondary | ICD-10-CM | POA: Diagnosis not present

## 2019-11-08 DIAGNOSIS — E785 Hyperlipidemia, unspecified: Secondary | ICD-10-CM | POA: Diagnosis not present

## 2019-11-08 DIAGNOSIS — E1165 Type 2 diabetes mellitus with hyperglycemia: Secondary | ICD-10-CM | POA: Diagnosis not present

## 2019-11-08 DIAGNOSIS — I2510 Atherosclerotic heart disease of native coronary artery without angina pectoris: Secondary | ICD-10-CM | POA: Diagnosis not present

## 2019-11-13 ENCOUNTER — Ambulatory Visit
Admission: RE | Admit: 2019-11-13 | Discharge: 2019-11-13 | Disposition: A | Discharge status: Home / Self Care | Payer: Medicare Other | Source: Ambulatory Visit | Attending: Physician Assistant | Admitting: Physician Assistant

## 2019-11-13 DIAGNOSIS — I712 Thoracic aortic aneurysm, without rupture, unspecified: Secondary | ICD-10-CM

## 2019-11-16 ENCOUNTER — Other Ambulatory Visit: Payer: Self-pay

## 2019-11-16 DIAGNOSIS — I712 Thoracic aortic aneurysm, without rupture, unspecified: Secondary | ICD-10-CM

## 2019-11-23 ENCOUNTER — Telehealth: Payer: Self-pay | Admitting: Neurology

## 2019-11-23 DIAGNOSIS — W19XXXA Unspecified fall, initial encounter: Secondary | ICD-10-CM

## 2019-11-23 DIAGNOSIS — Y92009 Unspecified place in unspecified non-institutional (private) residence as the place of occurrence of the external cause: Secondary | ICD-10-CM

## 2019-11-23 HISTORY — DX: Unspecified fall, initial encounter: Y92.009

## 2019-11-23 HISTORY — DX: Unspecified fall, initial encounter: W19.XXXA

## 2019-11-24 NOTE — Progress Notes (Deleted)
NEUROLOGY FOLLOW UP OFFICE NOTE  [name] [id number]  HISTORY OF PRESENT ILLNESS: [name] is a78year old right-handed man with type 2 diabetes mellitus complicated by polyneuropathy, renal failure status post transplant 2017, gastroparesis, CHF, CAD, and history of stroke who follows up for diabetic polyneuropathy and lumbosacral radiculopathy.  UPDATE: Current medication: Nortriptyline 25mg  at bedtime; gabapentin 600mg /300mg /900mg   Due to myoclonus and tremor in hands, we transitioned from gabapentin to nortriptyline.  He is currently taking ***  He has been to physical therapy for unsteady gait.  ***  HISTORY: He has history lumbar radiculopathy down both hips and legs (worse on the left) since injuring his back in the [employer] many years ago. MRI of lumbar spine from 06/16/13 showed disc bulge and facet arthropathy with moderate biforaminal narrowing at L5-S1. In the past he would get epidural injections which helped for about 6 months. He has diabetic neuropathy and has numbness and pain in the feet. He also has gout in the big toes. He has problems with balance. He has some residual weakness in the right leg due to stroke. 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 He has associated numbness. In 2020, pain in left leg has started to become worse. Due to worsening lumbosacral radicular pain, gabapentin was increased and he was started on nortriptyline. He saw the orthopedist. MRI of lumbar spine from 10/07/2018 showed slight progression of degenerative disc disease with mild left-predominant multilevel neural foraminal stenosis but no spinal stenosis. He also endorsed neck pain, so MRI of cervical spine was also performed, which demonstrated mild degenerative disc disease predominantly at C5-6 with mild bilateral neural foraminal stenosis but no spinal stenosis.  Past medications: Tramadol 50mg ; Lyrica (aggravated PTSD);  PAST MEDICAL  HISTORY: Past Medical History:  Diagnosis Date  . Allergy   . Angina   . Arthritis   . Backache, unspecified   . CHF (congestive heart failure) ([place])   . Chills   . Chronic kidney disease, stage III (moderate)    HD T- TH-SAT  . Complication of anesthesia    01/2011 could not eat,, hospt x2, was placed on hd and cleared up  . Coronary atherosclerosis of native coronary artery   . Diabetes mellitus 2004  . Dizziness   . Dysphagia, unspecified(787.20)   . Gastroparesis   . Headache(784.0)   . Hemorrhage of rectum and anus   . Hyperlipidemia   . Hypertrophy of prostate with urinary obstruction and other lower urinary tract symptoms (LUTS)   . INTERNAL HEMORRHOIDS 10/16/2008   Qualifier: Diagnosis of  By: Nolon Rod CMA (AAMA), Robin    . Internal hemorrhoids without mention of complication   . Myocardial infarction ([place]) 2002  . Nausea alone   . Obesity   . Other dyspnea and respiratory abnormality   . Other malaise and fatigue   . Personal history of unspecified circulatory disease   . Posttraumatic stress disorder   . Prostate cancer ([place])   . Sleep apnea    USES CPAP   . Stroke [facility])    2007  . Unspecified essential hypertension    hx htn     MEDICATIONS: Current Outpatient Medications on File Prior to Visit  Medication Sig Dispense Refill  . acetaminophen (TYLENOL) 500 MG tablet Take 1,000 mg by mouth as needed for mild pain or headache.     . allopurinol (ZYLOPRIM) 100 MG tablet Take 100 mg by mouth daily.     [name] amLODipine (NORVASC) 10  MG tablet Take 10 mg by mouth daily.    [name] aspirin 81 MG tablet Take 81 mg by mouth at bedtime.     [name] atorvastatin (LIPITOR) 40 MG tablet Take 40 mg by mouth daily.    . clopidogrel (PLAVIX) 75 MG tablet Take 75 mg by mouth daily.     . diazepam (VALIUM) 5 MG tablet Take 1 tablet (5 mg total) by mouth at bedtime. 30 tablet 0  . furosemide (LASIX) 40 MG tablet Take 1 tablet (40 mg total) by mouth daily. (Patient taking differently: Take  40 mg by mouth 2 (two) times daily. ) 30 tablet   . gabapentin (NEURONTIN) 300 MG capsule Take 2 capsules in morning, 1 capsule in afternoon, and 2 capsules at night. 150 capsule 5  . glucose blood (ACCU-CHEK AVIVA PLUS) test strip USE TO TEST BLOOD SUGAR 3 TIMES A DAY 300 strip 1  . insulin aspart protamine- aspart (NOVOLOG MIX 70/30) (70-30) 100 UNIT/ML injection Inject 75 Units into the skin 2 (two) times daily with a meal.     . Insulin Syringe-Needle U-100 (B-D INS SYR ULTRAFINE 1CC/31G) 31G X 5/16" 1 ML MISC Used to give insulin injections twice daily. 100 each 11  . Magnesium 200 MG TABS Take 200 mg by mouth daily with lunch.     . mycophenolate (MYFORTIC) 180 MG EC tablet Take 180 mg by mouth 2 (two) times daily.    . nitroGLYCERIN (NITROSTAT) 0.4 MG SL tablet PLACE 1 TABLET (0.4 MG TOTAL) UNDER THE TONGUE EVERY 5 (FIVE) MINUTES AS NEEDED FOR CHEST PAIN. 25 tablet 2  . nortriptyline (PAMELOR) 75 MG capsule Take 1 capsule (75 mg total) by mouth at bedtime. Take 75mg  at bedtime for a week, then 100mg  at bedtime. 60 capsule 0  . omeprazole (PRILOSEC) 20 MG capsule Take 1 capsule (20 mg total) by mouth 2 (two) times daily before a meal. 180 capsule 3  . Semaglutide,0.25 or 0.5MG /DOS, (OZEMPIC, 0.25 OR 0.5 MG/DOSE,) 2 MG/1.5ML SOPN Inject 1 mg into the skin once a week.     . tacrolimus (PROGRAF) 5 MG capsule 5 mg 2 (two) times daily.     . Vitamin D, Ergocalciferol, (DRISDOL) 1.25 MG (50000 UNIT) CAPS capsule Take 50,000 Units by mouth 2 (two) times a week.     No current facility-administered medications on file prior to visit.    ALLERGIES: No Known Allergies  FAMILY HISTORY: Family History  Problem Relation Age of Onset  . Heart disease Father   . Renal Disease Father   . Dementia Mother   . Heart attack Mother   . Diabetes Sister   . Heart disease Sister   . Diabetes Maternal Aunt   . Diabetes Maternal Uncle   . Diabetes Paternal Aunt   . Diabetes Paternal Uncle   . Heart  disease Other   . Heart attack Brother   . Lung cancer Sister   . Renal Disease Brother   . Ovarian cancer Cousin   . Lung cancer Cousin   . Breast cancer Neg Hx   . Colon cancer Neg Hx   . Pancreatic cancer Neg Hx   . Prostate cancer Neg Hx    SOCIAL HISTORY: Social History   Socioeconomic History  . Marital status: Married    Spouse name: Elisha Headland  . Number of children: 3  . Years of education: Not on file  . Highest education level: Associate degree: academic program  Occupational History  . Occupation: disabled  Employer: DISABLED  Tobacco Use  . Smoking status: Never Smoker  . Smokeless tobacco: Never Used  Vaping Use  . Vaping Use: Never used  Substance and Sexual Activity  . Alcohol use: No  . Drug use: No  . Sexual activity: Not Currently  Other Topics Concern  . Not on file  Social History Narrative   Married 4033. 46 year old son in 42. 1 granddaughter from [place].       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 He lives with his wife. He drinks 3-4 cups of coffee a day. He walks most every day.   Social Determinants of Health   Financial Resource Strain:   . Difficulty of Paying Living Expenses:   Food Insecurity:   . Worried About Charity fundraiser in the Last Year:   . Arboriculturist in the Last Year:   Transportation Needs:   . Film/video editor (Medical):   [name] Lack of Transportation (Non-Medical):   Physical Activity:   . Days of Exercise per Week:   . Minutes of Exercise per Session:   Stress:   . Feeling of Stress :   Social Connections:   . Frequency of Communication with Friends and Family:   . Frequency of Social Gatherings with Friends and Family:   . Attends Religious Services:   . Active Member of Clubs or Organizations:   . Attends Archivist Meetings:   [name]  Marital Status:   Intimate Partner Violence:   . Fear of Current or Ex-Partner:   . Emotionally Abused:   [name] Physically Abused:   . Sexually Abused:     PHYSICAL EXAM: *** General: No acute distress.  Patient appears well-groomed.   Head:  Normocephalic/atraumatic Eyes:  Fundi examined but not visualized Neck: supple, no paraspinal tenderness, full range of motion Heart:  Regular rate and rhythm Lungs:  Clear to auscultation bilaterally Back: No paraspinal tenderness Neurological Exam: alert and oriented to person, place, and time. Attention span and concentration intact, recent and remote memory intact, fund of knowledge intact.  Speech fluent and not dysarthric, language intact.  CN II-XII intact. Bulk and tone normal, muscle strength 5/5 throughout.  Sensation to light touch, temperature and vibration intact.  Deep tendon reflexes 2+ throughout, toes downgoing.  Finger to nose and heel to shin testing intact.  Gait normal, Romberg negative.  IMPRESSION: 1.  Diabetic polyneuropathy 2.  Chronic low back pain/lumbosacral radiculopathy 3.  Cervicalgia 4.  ***  PLAN: ***  Metta Clines, DO  CC: Garret Reddish, MD

## 2019-11-27 DIAGNOSIS — E118 Type 2 diabetes mellitus with unspecified complications: Secondary | ICD-10-CM | POA: Diagnosis not present

## 2019-11-28 ENCOUNTER — Telehealth: Payer: Self-pay

## 2019-11-28 ENCOUNTER — Other Ambulatory Visit: Payer: Self-pay

## 2019-11-28 ENCOUNTER — Ambulatory Visit: Payer: Medicare Other | Admitting: Neurology

## 2019-11-28 ENCOUNTER — Encounter: Payer: Self-pay | Admitting: Urology

## 2019-11-28 NOTE — Telephone Encounter (Signed)
Spoke with patient in regards to 1 month telephone follow-up appointment with Freeman Caldron PA on 11/30/19 at 2:00pm. Patient verbalized understanding of appointment date and time. Meaningful use, AUA and prostate questions were reviewed. TM

## 2019-11-28 NOTE — Telephone Encounter (Signed)
Per Nevin Bloodgood, asked me to route back to her.

## 2019-11-28 NOTE — Telephone Encounter (Signed)
[facility] 11/24/19 @ 5:11PM:   "Caller states that he is out of his Rx for pain medicine Nortriptyline. Having trouble sleeping and walking on leg. [facility] call [facility] to [facility] on [address].  Nurse assessment: Caller states that he is out of his Rx for pain medicine Nortriptyline 75mg  one capsule by mouth at bedtime for 7 days then increase to 100mg . Having trouble sleeping and walking on leg. [facility] on [address].  Patient unable to obtain loaner dose from pharmacy.   On-call physician was paged and reached - Dr. Carles Collet. On call stated that nurse could call in the medication Nortriptyline 100mg  (enough to get through weekend).  Patient notified that the Rx has been called in."

## 2019-11-28 NOTE — Progress Notes (Signed)
Patient presents for a 1 month follow-up  telephone appointment with Freeman Caldron PA for Prostate cancer radiation treatment. Patient states that he is not emptying his bladder completely only in the morning. Patient states that he is returning to the bathroom less than 2 hours later. Patient denies having nocturia or dysuria. Patient states that he is having trouble with constipation. Patient states urine stream is strong and steady. Patient denies having urgency. Patient states hesitancy and straining in the morning. Patient denies having leakage. Patient states that instead of feeling fatigue he feels weakness all the time.

## 2019-11-28 NOTE — Telephone Encounter (Signed)
Verified that pt picked up nortriptyline refill on 11/27/2019.

## 2019-11-30 ENCOUNTER — Other Ambulatory Visit: Payer: Self-pay

## 2019-11-30 ENCOUNTER — Ambulatory Visit
Admission: RE | Admit: 2019-11-30 | Discharge: 2019-11-30 | Disposition: A | Discharge status: Home / Self Care | Payer: Medicare Other | Source: Ambulatory Visit | Attending: Urology | Admitting: Urology

## 2019-11-30 DIAGNOSIS — C61 Malignant neoplasm of prostate: Secondary | ICD-10-CM

## 2019-11-30 NOTE — Progress Notes (Signed)
Radiation Oncology         [phone] ________________________________  Name: [name] MRN: [id number]  Date: 11/30/2019  DOB: [birth year]  Post Treatment Note  CC: [name], MD  [name], MD  Diagnosis:   69 y.o. gentleman with Stage T1c adenocarcinoma of the prostate with Gleason score of 4+3, and PSA of 25.6.  Interval Since Last Radiation:  5.5 weeks  08/29/19 - 10/24/19:   1. The prostate, seminal vesicles, and pelvic lymph nodes were initially treated to 45 Gy in 25 fractions of 1.8 Gy  2. The prostate only was boosted to 75 Gy with 15 additional fractions of 2.0 Gy   Narrative:  I spoke with the patient to conduct his routine scheduled 1 month follow up visit via telephone to spare the patient unnecessary potential exposure in the healthcare setting during the current COVID-19 pandemic.  The patient was notified in advance and gave permission to proceed with this visit format. He tolerated his radiation treatments relatively well with only mild urinary symptoms and modest fatigue.                              On review of systems, the patient states that he is doing well in general.  He reports gradual improvement in his LUTS with current IPSS score of 3 which is pretty much back to his baseline.  He reports some weakness in the flow of stream in the mornings but this improves throughout the day and he feels that he is emptying his bladder adequately.  He specifically denies dysuria, gross hematuria, excessive daytime frequency, urgency, or incontinence.  He continues with moderate fatigue associated with the ADT but feels that he is tolerating this well.  He has had some occasional constipation but denies abdominal pain, nausea, vomiting or diarrhea.  He reports a healthy appetite and is maintaining his weight.  Overall, he is quite pleased with his progress to date.  ALLERGIES:  has No Known Allergies.  Meds: Current Outpatient Medications  Medication Sig  Dispense Refill   acetaminophen (TYLENOL) 500 MG tablet Take 1,000 mg by mouth as needed for mild pain or headache.      allopurinol (ZYLOPRIM) 100 MG tablet Take 100 mg by mouth daily.      amLODipine (NORVASC) 10 MG tablet Take 10 mg by mouth daily.     aspirin 81 MG tablet Take 81 mg by mouth at bedtime.      atorvastatin (LIPITOR) 40 MG tablet Take 40 mg by mouth daily.     clopidogrel (PLAVIX) 75 MG tablet Take 75 mg by mouth daily.      diazepam (VALIUM) 5 MG tablet Take 1 tablet (5 mg total) by mouth at bedtime. 30 tablet 0   furosemide (LASIX) 40 MG tablet Take 1 tablet (40 mg total) by mouth daily. (Patient taking differently: Take 40 mg by mouth 2 (two) times daily. ) 30 tablet    gabapentin (NEURONTIN) 300 MG capsule Take 2 capsules in morning, 1 capsule in afternoon, and 2 capsules at night. 150 capsule 5   glucose blood (ACCU-CHEK AVIVA PLUS) test strip USE TO TEST BLOOD SUGAR 3 TIMES A DAY 300 strip 1   insulin aspart protamine- aspart (NOVOLOG MIX 70/30) (70-30) 100 UNIT/ML injection Inject 75 Units into the skin 2 (two) times daily with a meal.      Insulin Syringe-Needle U-100 (B-D INS SYR ULTRAFINE 1CC/31G)  31G X 5/16" 1 ML MISC Used to give insulin injections twice daily. 100 each 11   Magnesium 200 MG TABS Take 200 mg by mouth daily with lunch.      mycophenolate (MYFORTIC) 180 MG EC tablet Take 180 mg by mouth 2 (two) times daily.     nitroGLYCERIN (NITROSTAT) 0.4 MG SL tablet PLACE 1 TABLET (0.4 MG TOTAL) UNDER THE TONGUE EVERY 5 (FIVE) MINUTES AS NEEDED FOR CHEST PAIN. 25 tablet 2   nortriptyline (PAMELOR) 75 MG capsule Take 1 capsule (75 mg total) by mouth at bedtime. Take 75mg  at bedtime for a week, then 100mg  at bedtime. 60 capsule 0   omeprazole (PRILOSEC) 20 MG capsule Take 1 capsule (20 mg total) by mouth 2 (two) times daily before a meal. 180 capsule 3   Semaglutide,0.25 or 0.5MG /DOS, (OZEMPIC, 0.25 OR 0.5 MG/DOSE,) 2 MG/1.5ML SOPN Inject 1 mg into  the skin once a week.      tacrolimus (PROGRAF) 5 MG capsule 5 mg 2 (two) times daily.      Vitamin D, Ergocalciferol, (DRISDOL) 1.25 MG (50000 UNIT) CAPS capsule Take 50,000 Units by mouth 2 (two) times a week.     No current facility-administered medications for this encounter.    Physical Findings:  vitals were not taken for this visit.   /Unable to assess due to telephone follow-up visit format.  Lab Findings: Lab Results  Component Value Date   WBC 3.9 (L) 01/16/2019   HGB 14.2 01/16/2019   HCT 43.4 01/16/2019   MCV 83.3 01/16/2019   PLT 261.0 01/16/2019     Radiographic Findings: CT CHEST WO CONTRAST  Result Date: 11/14/2019 CLINICAL DATA:  69 year old male with history of thoracic aortic aneurysm. History of prostate cancer. Follow-up study. EXAM: CT CHEST WITHOUT CONTRAST TECHNIQUE: Multidetector CT imaging of the chest was performed following the standard protocol without IV contrast. COMPARISON:  Chest CT 04/14/2019. FINDINGS: Cardiovascular: Heart size is normal. There is no significant pericardial fluid, thickening or pericardial calcification. There is aortic atherosclerosis, as well as atherosclerosis of the great vessels of the mediastinum and the coronary arteries, including calcified atherosclerotic plaque in the left main, left anterior descending, left circumflex and right coronary arteries. Mild calcifications of the aortic valve. Aneurysmal dilatation of the ascending thoracic aorta (4.8 cm in diameter), similar to the prior study. Mediastinum/Nodes: No pathologically enlarged mediastinal or hilar lymph nodes. Please note that accurate exclusion of hilar adenopathy is limited on noncontrast CT scans. Esophagus is unremarkable in appearance. No axillary lymphadenopathy. Lungs/Pleura: A few scattered tiny calcified granulomas are noted in the right lung. No other suspicious appearing pulmonary nodules or masses are noted. No acute consolidative airspace disease. No  pleural effusions. Upper Abdomen: Mild diffuse low attenuation throughout the visualized hepatic parenchyma, indicative of hepatic steatosis. A few scattered calcified granulomas are noted in the liver. Aortic atherosclerosis. Atrophy of the kidneys bilaterally. 2 cm low-attenuation lesion in the interpolar region of the right kidney and exophytic 7 mm intermediate attenuation lesion in the interpolar region of the right kidney are both incompletely characterized on today's non-contrast CT examination, but are similar to the prior study and favored to represent cysts. Musculoskeletal: There are no aggressive appearing lytic or blastic lesions noted in the visualized portions of the skeleton. IMPRESSION: 1. Ascending thoracic aortic aneurysm measuring 4.8 cm in diameter, unchanged compared to the prior examination. Ascending thoracic aortic aneurysm. Recommend semi-annual imaging followup by CTA or MRA and referral to cardiothoracic surgery if not already  obtained. This recommendation follows 2010 ACCF/AHA/AATS/ACR/ASA/SCA/SCAI/SIR/STS/SVM Guidelines for the Diagnosis and Management of Patients With Thoracic Aortic Disease. Circulation. 2010; 121: W413-K440. Aortic aneurysm NOS (ICD10-I71.9). 2. Aortic atherosclerosis, in addition to left main and 3 vessel coronary artery disease. Please note that although the presence of coronary artery calcium documents the presence of coronary artery disease, the severity of this disease and any potential stenosis cannot be assessed on this non-gated CT examination. Assessment for potential risk factor modification, dietary therapy or pharmacologic therapy may be warranted, if clinically indicated. 3. There are calcifications of the aortic valve. Echocardiographic correlation for evaluation of potential valvular dysfunction may be warranted if clinically indicated. 4. Mild hepatic steatosis. Aortic Atherosclerosis (ICD10-I70.0). Electronically Signed   By: Vinnie Langton M.D.    On: 11/14/2019 09:20    Impression/Plan: 1. 69 y.o. gentleman with Stage T1c adenocarcinoma of the prostate with Gleason score of 4+3, and PSA of 25.6. He will continue to follow up with urology for ongoing PSA determinations and has an appointment scheduled with Dr. Lovena Neighbours on 12/11/19 when he will be due for his next ADT injection.  He anticipates completing a full 2-year course of ADT and feels that he is tolerating this treatment well.  He understands what to expect with regards to PSA monitoring going forward. I will look forward to following his response to treatment via correspondence with urology, and would be happy to continue to participate in his care if clinically indicated. I talked to the patient about what to expect in the future, including his risk for erectile dysfunction and rectal bleeding. I encouraged him to call or return to the office if he has any questions regarding his previous radiation or possible radiation side effects. He was comfortable with this plan and will follow up as needed.  Today, a comprehensive survivorship care plan and treatment summary was reviewed with the patient today detailing his prostate cancer diagnosis, treatment course, potential late/long-term effects of treatment, appropriate follow-up care with recommendations for the future, and patient education resources.  A copy of this summary, along with a letter will be sent to the patients primary care provider via mail/fax/In Basket message after todays visit.  2. Cancer screening:  Due to Mr. Raymond's history and his age, he should receive screening for skin cancers and colon cancer.  The information and recommendations are listed on the patient's comprehensive care plan/treatment summary and were reviewed in detail with the patient.     3. Health maintenance and wellness promotion: Mr. Riel was encouraged to consume 5-7 servings of fruits and vegetables per day. He was provided a copy of the "Nutrition  [employer]" handout, as well as the handout "Take Control of Your Health and [place]" from the [place].  He was also encouraged to engage in moderate to vigorous exercise for 30 minutes per day most days of the week. Information was provided regarding the [facility] fitness program, which is designed for cancer survivors to help them become more physically fit after cancer treatments. We discussed that a healthy BMI is 18.5-24.9 and that maintaining a healthy weight reduces risk of cancer recurrences.  He was instructed to limit his alcohol consumption and continue to abstain from tobacco use.  Lastly, he was encouraged to use sunscreen and wear protective clothing when in the sun.     4. Support services/counseling: It is not uncommon for this period of the patient's cancer care trajectory to be one of many emotions and stressors.  Mr. Bouillon was encouraged to take advantage of our many support services programs, support groups, and/or counseling in coping with his new life as a cancer survivor after completing anti-cancer treatment.  He was offered support today through active listening and expressive supportive counseling.  He was given information regarding our available services and encouraged to contact me with any questions or for help enrolling in any of our support group/programs.       Nicholos Johns, PA-C

## 2019-12-01 DIAGNOSIS — I129 Hypertensive chronic kidney disease with stage 1 through stage 4 chronic kidney disease, or unspecified chronic kidney disease: Secondary | ICD-10-CM | POA: Diagnosis not present

## 2019-12-01 DIAGNOSIS — Z940 Kidney transplant status: Secondary | ICD-10-CM | POA: Diagnosis not present

## 2019-12-07 NOTE — Progress Notes (Signed)
NEUROLOGY FOLLOW UP OFFICE NOTE  [name] [id number]  HISTORY OF PRESENT ILLNESS: [name] is a69year old right-handed man with type 2 diabetes mellitus complicated by polyneuropathy, renal failure status post transplant 2017, gastroparesis, CHF, CAD, and history of stroke who follows up for diabetic polyneuropathy and lumbosacral radiculopathy.  UPDATE: Current medication: Nortriptyline 100mg  at bedtime; gabapentin 600mg /300mg /900mg   Due to myoclonus and tremor in hands, we attempted to transition from gabapentin to nortriptyline.  We had decreased gabapentin to 600mg  at night but he experienced increased pain so he increased back to 900mg  at night.  Nortriptyline was titrated up to 100mg  at bedtime, which has helped with the pain.  He still has a tremor in the right hand which sometimes jerks.  His right leg may sometimes jerk as well.    He has been to physical therapy for unsteady gait.  He stopped when he underwent treatment for prostate cancer.  He is now in remission.  HISTORY: He has history lumbar radiculopathy down both hips and legs (worse on the left) since injuring his back in the [employer] many years ago. MRI of lumbar spine from 06/16/13 showed disc bulge and facet arthropathy with moderate biforaminal narrowing at L5-S1. In the past he would get epidural injections which helped for about 6 months. He has diabetic neuropathy and has numbness and pain in the feet. He also has gout in the big toes. He has problems with balance. He has some residual weakness in the right leg due to stroke. 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 He has associated numbness. In 2020, pain in left leg has started to become worse. Due to worsening lumbosacral radicular pain, gabapentin was increased and he was started on nortriptyline. He saw the orthopedist. MRI of lumbar spine from 10/07/2018 showed slight progression of degenerative disc disease with mild  left-predominant multilevel neural foraminal stenosis but no spinal stenosis. He also endorsed neck pain, so MRI of cervical spine was also performed, which demonstrated mild degenerative disc disease predominantly at C5-6 with mild bilateral neural foraminal stenosis but no spinal stenosis.  Past medications: Tramadol 50mg ; Lyrica (aggravated PTSD);  PAST MEDICAL HISTORY: Past Medical History:  Diagnosis Date  . Allergy   . Angina   . Arthritis   . Backache, unspecified   . CHF (congestive heart failure) ([place])   . Chills   . Chronic kidney disease, stage III (moderate)    HD T- TH-SAT  . Complication of anesthesia    01/2011 could not eat,, hospt x2, was placed on hd and cleared up  . Coronary atherosclerosis of native coronary artery   . Diabetes mellitus 2004  . Dizziness   . Dysphagia, unspecified(787.20)   . Gastroparesis   . Headache(784.0)   . Hemorrhage of rectum and anus   . Hyperlipidemia   . Hypertrophy of prostate with urinary obstruction and other lower urinary tract symptoms (LUTS)   . INTERNAL HEMORRHOIDS 10/16/2008   Qualifier: Diagnosis of  By: Nolon Rod CMA (AAMA), Robin    . Internal hemorrhoids without mention of complication   . Myocardial infarction ([place]) 2002  . Nausea alone   . Obesity   . Other dyspnea and respiratory abnormality   . Other malaise and fatigue   . Personal history of unspecified circulatory disease   . Posttraumatic stress disorder   . Prostate cancer ([place])   . Sleep apnea    USES CPAP   . Stroke [facility])    2007  .  Unspecified essential hypertension    hx htn     MEDICATIONS: Current Outpatient Medications on File Prior to Visit  Medication Sig Dispense Refill  . acetaminophen (TYLENOL) 500 MG tablet Take 1,000 mg by mouth as needed for mild pain or headache.     . allopurinol (ZYLOPRIM) 100 MG tablet Take 100 mg by mouth daily.     [name] amLODipine (NORVASC) 10 MG tablet Take 10 mg by mouth daily.    [name] aspirin 81 MG tablet Take  81 mg by mouth at bedtime.     [name] atorvastatin (LIPITOR) 40 MG tablet Take 40 mg by mouth daily.    . clopidogrel (PLAVIX) 75 MG tablet Take 75 mg by mouth daily.     . diazepam (VALIUM) 5 MG tablet Take 1 tablet (5 mg total) by mouth at bedtime. 30 tablet 0  . furosemide (LASIX) 40 MG tablet Take 1 tablet (40 mg total) by mouth daily. (Patient taking differently: Take 40 mg by mouth 2 (two) times daily. ) 30 tablet   . gabapentin (NEURONTIN) 300 MG capsule Take 2 capsules in morning, 1 capsule in afternoon, and 2 capsules at night. 150 capsule 5  . glucose blood (ACCU-CHEK AVIVA PLUS) test strip USE TO TEST BLOOD SUGAR 3 TIMES A DAY 300 strip 1  . insulin aspart protamine- aspart (NOVOLOG MIX 70/30) (70-30) 100 UNIT/ML injection Inject 75 Units into the skin 2 (two) times daily with a meal.     . Insulin Syringe-Needle U-100 (B-D INS SYR ULTRAFINE 1CC/31G) 31G X 5/16" 1 ML MISC Used to give insulin injections twice daily. 100 each 11  . Magnesium 200 MG TABS Take 200 mg by mouth daily with lunch.     . mycophenolate (MYFORTIC) 180 MG EC tablet Take 180 mg by mouth 2 (two) times daily.    . nitroGLYCERIN (NITROSTAT) 0.4 MG SL tablet PLACE 1 TABLET (0.4 MG TOTAL) UNDER THE TONGUE EVERY 5 (FIVE) MINUTES AS NEEDED FOR CHEST PAIN. 25 tablet 2  . nortriptyline (PAMELOR) 75 MG capsule Take 1 capsule (75 mg total) by mouth at bedtime. Take 75mg  at bedtime for a week, then 100mg  at bedtime. 60 capsule 0  . omeprazole (PRILOSEC) 20 MG capsule Take 1 capsule (20 mg total) by mouth 2 (two) times daily before a meal. 180 capsule 3  . Semaglutide,0.25 or 0.5MG /DOS, (OZEMPIC, 0.25 OR 0.5 MG/DOSE,) 2 MG/1.5ML SOPN Inject 1 mg into the skin once a week.     . tacrolimus (PROGRAF) 5 MG capsule 5 mg 2 (two) times daily.     . Vitamin D, Ergocalciferol, (DRISDOL) 1.25 MG (50000 UNIT) CAPS capsule Take 50,000 Units by mouth 2 (two) times a week.     No current facility-administered medications on file prior to visit.     ALLERGIES: No Known Allergies  FAMILY HISTORY: Family History  Problem Relation Age of Onset  . Heart disease Father   . Renal Disease Father   . Dementia Mother   . Heart attack Mother   . Diabetes Sister   . Heart disease Sister   . Diabetes Maternal Aunt   . Diabetes Maternal Uncle   . Diabetes Paternal Aunt   . Diabetes Paternal Uncle   . Heart disease Other   . Heart attack Brother   . Lung cancer Sister   . Renal Disease Brother   . Ovarian cancer Cousin   . Lung cancer Cousin   . Breast cancer Neg Hx   . Colon cancer  Neg Hx   . Pancreatic cancer Neg Hx   . Prostate cancer Neg Hx    SOCIAL HISTORY: Social History   Socioeconomic History  . Marital status: Married    Spouse name: Elisha Headland  . Number of children: 3  . Years of education: Not on file  . Highest education level: Associate degree: academic program  Occupational History  . Occupation: disabled    Employer: DISABLED  Tobacco Use  . Smoking status: Never Smoker  . Smokeless tobacco: Never Used  Vaping Use  . Vaping Use: Never used  Substance and Sexual Activity  . Alcohol use: No  . Drug use: No  . Sexual activity: Not Currently  Other Topics Concern  . Not on file  Social History Narrative   Married [id number]. 5 year old son in 94. 1 granddaughter from [place].       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 He lives with his wife. He drinks 3-4 cups of coffee a day. He walks most every day.   Social Determinants of Health   Financial Resource Strain:   . Difficulty of Paying Living Expenses:   Food Insecurity:   . Worried About Charity fundraiser in the Last Year:   . Arboriculturist in the Last Year:   Transportation Needs:   . Film/video editor (Medical):   [name] Lack of Transportation (Non-Medical):   Physical Activity:    . Days of Exercise per Week:   . Minutes of Exercise per Session:   Stress:   . Feeling of Stress :   Social Connections:   . Frequency of Communication with Friends and Family:   . Frequency of Social Gatherings with Friends and Family:   . Attends Religious Services:   . Active Member of Clubs or Organizations:   . Attends Archivist Meetings:   [name] Marital Status:   Intimate Partner Violence:   . Fear of Current or Ex-Partner:   . Emotionally Abused:   [name] Physically Abused:   . Sexually Abused:     PHYSICAL EXAM: Blood pressure 132/73, pulse 86, resp. rate 18, height 5' 10.5" (1.791 m), weight 247 lb (112 kg), SpO2 97 %. General: No acute distress.  Patient appears well-groomed.   Head:  Normocephalic/atraumatic Eyes:  Fundi examined but not visualized Neck: supple, no paraspinal tenderness, full range of motion Heart:  Regular rate and rhythm Lungs:  Clear to auscultation bilaterally Back: No paraspinal tenderness Neurological Exam: alert and oriented to person, place, and time. Attention span and concentration intact, recent and remote memory intact, fund of knowledge intact.  Speech fluent and not dysarthric, language intact.  CN II-XII intact. Bulk and tone normal, no rigidity, muscle strength 4+/5 left hip flexion, otherwise 5/5 throughout.  Postural and kinetic tremor in right hand.  No bradykinesia.  Sensation to pinprick and vibration reduced in lower extremities up to below knees.  Deep tendon reflexes absent throughout, toes downgoing.  Finger to nose testing intact.  Wide-based unsteady gait.  Romberg with sway.  IMPRESSION: 1.  Diabetic polyneuropathy.  Nortriptyline helpful. 2.  Chronic low back pain/lumbosacral radiculopathy.  Nortriptyline helpful 3.  Tremor.  No other symptoms of Parkinson's disease noted.  May be essential tremor.  He describes occasional myoclonus, raising possibility of gabapentin side effect.  PLAN:  1.  Nortriptyline 100mg  at  bedtime 2.  Will attempt to taper down dose of gabapentin to 300mg  three times daily.  If he has increased pain, he may increase back to lowest therapeutic dose. 3.  Follow up in 6 months.  Metta Clines, DO  CC: Garret Reddish, MD

## 2019-12-08 ENCOUNTER — Ambulatory Visit (INDEPENDENT_AMBULATORY_CARE_PROVIDER_SITE_OTHER): Payer: Medicare Other | Admitting: Neurology

## 2019-12-08 ENCOUNTER — Encounter: Payer: Self-pay | Admitting: Neurology

## 2019-12-08 ENCOUNTER — Other Ambulatory Visit: Payer: Self-pay

## 2019-12-08 VITALS — BP 132/73 | HR 86 | Resp 18 | Ht 70.5 in | Wt 247.0 lb

## 2019-12-08 DIAGNOSIS — R251 Tremor, unspecified: Secondary | ICD-10-CM

## 2019-12-08 DIAGNOSIS — E1142 Type 2 diabetes mellitus with diabetic polyneuropathy: Secondary | ICD-10-CM

## 2019-12-08 DIAGNOSIS — M5416 Radiculopathy, lumbar region: Secondary | ICD-10-CM

## 2019-12-08 MED ORDER — NORTRIPTYLINE HCL 50 MG PO CAPS: 100.0000 mg | ORAL_CAPSULE | Freq: Every day | ORAL | 5 refills | Status: DC

## 2019-12-08 NOTE — Patient Instructions (Signed)
1.  Continue nortriptyline 100mg  at bedtime 2.  We will attempt to decrease gabapentin:  - take 2 pills in morning, 1 pill afternoon, 2 pills at night for one week  - then 1 pill in morning, 1 pill afternoon, 2 pills at night for one week  - then 1 pill in morning, 1 pill afternoon, 1 pill at night.  I will have you remain on this dose for now.  If pain increases, you may increase back to previous dose that was effective. 3.  Follow up in 6 months.

## 2019-12-18 ENCOUNTER — Telehealth: Payer: Self-pay | Admitting: Family Medicine

## 2019-12-18 ENCOUNTER — Other Ambulatory Visit: Payer: Self-pay

## 2019-12-18 ENCOUNTER — Inpatient Hospital Stay (HOSPITAL_COMMUNITY)
Admission: EM | Admit: 2019-12-18 | Discharge: 2019-12-22 | DRG: 313 | Disposition: A | Discharge status: Home / Self Care | Payer: No Typology Code available for payment source | Attending: Internal Medicine | Admitting: Internal Medicine

## 2019-12-18 ENCOUNTER — Encounter (HOSPITAL_COMMUNITY): Payer: Self-pay | Admitting: Pharmacy Technician

## 2019-12-18 DIAGNOSIS — Z79899 Other long term (current) drug therapy: Secondary | ICD-10-CM

## 2019-12-18 DIAGNOSIS — Z940 Kidney transplant status: Secondary | ICD-10-CM

## 2019-12-18 DIAGNOSIS — R296 Repeated falls: Secondary | ICD-10-CM

## 2019-12-18 DIAGNOSIS — N1831 Chronic kidney disease, stage 3a: Secondary | ICD-10-CM | POA: Diagnosis present

## 2019-12-18 DIAGNOSIS — I712 Thoracic aortic aneurysm, without rupture, unspecified: Secondary | ICD-10-CM | POA: Diagnosis present

## 2019-12-18 DIAGNOSIS — I77810 Thoracic aortic ectasia: Secondary | ICD-10-CM | POA: Diagnosis present

## 2019-12-18 DIAGNOSIS — I4510 Unspecified right bundle-branch block: Secondary | ICD-10-CM | POA: Diagnosis present

## 2019-12-18 DIAGNOSIS — M48061 Spinal stenosis, lumbar region without neurogenic claudication: Secondary | ICD-10-CM | POA: Diagnosis present

## 2019-12-18 DIAGNOSIS — M545 Low back pain: Secondary | ICD-10-CM | POA: Diagnosis not present

## 2019-12-18 DIAGNOSIS — E1140 Type 2 diabetes mellitus with diabetic neuropathy, unspecified: Secondary | ICD-10-CM | POA: Diagnosis present

## 2019-12-18 DIAGNOSIS — E876 Hypokalemia: Secondary | ICD-10-CM | POA: Diagnosis present

## 2019-12-18 DIAGNOSIS — R0789 Other chest pain: Principal | ICD-10-CM | POA: Diagnosis present

## 2019-12-18 DIAGNOSIS — R251 Tremor, unspecified: Secondary | ICD-10-CM | POA: Diagnosis present

## 2019-12-18 DIAGNOSIS — N400 Enlarged prostate without lower urinary tract symptoms: Secondary | ICD-10-CM | POA: Diagnosis present

## 2019-12-18 DIAGNOSIS — Z833 Family history of diabetes mellitus: Secondary | ICD-10-CM

## 2019-12-18 DIAGNOSIS — Z794 Long term (current) use of insulin: Secondary | ICD-10-CM

## 2019-12-18 DIAGNOSIS — Z20822 Contact with and (suspected) exposure to covid-19: Secondary | ICD-10-CM | POA: Diagnosis present

## 2019-12-18 DIAGNOSIS — F4310 Post-traumatic stress disorder, unspecified: Secondary | ICD-10-CM | POA: Diagnosis present

## 2019-12-18 DIAGNOSIS — R079 Chest pain, unspecified: Secondary | ICD-10-CM | POA: Diagnosis not present

## 2019-12-18 DIAGNOSIS — M25562 Pain in left knee: Secondary | ICD-10-CM | POA: Diagnosis not present

## 2019-12-18 DIAGNOSIS — G319 Degenerative disease of nervous system, unspecified: Secondary | ICD-10-CM | POA: Diagnosis not present

## 2019-12-18 DIAGNOSIS — R531 Weakness: Secondary | ICD-10-CM | POA: Diagnosis present

## 2019-12-18 DIAGNOSIS — S8991XA Unspecified injury of right lower leg, initial encounter: Secondary | ICD-10-CM | POA: Diagnosis not present

## 2019-12-18 DIAGNOSIS — W19XXXA Unspecified fall, initial encounter: Secondary | ICD-10-CM | POA: Diagnosis present

## 2019-12-18 DIAGNOSIS — M4802 Spinal stenosis, cervical region: Secondary | ICD-10-CM | POA: Diagnosis present

## 2019-12-18 DIAGNOSIS — C61 Malignant neoplasm of prostate: Secondary | ICD-10-CM | POA: Diagnosis present

## 2019-12-18 DIAGNOSIS — Z8673 Personal history of transient ischemic attack (TIA), and cerebral infarction without residual deficits: Secondary | ICD-10-CM

## 2019-12-18 DIAGNOSIS — E1142 Type 2 diabetes mellitus with diabetic polyneuropathy: Secondary | ICD-10-CM

## 2019-12-18 DIAGNOSIS — I2510 Atherosclerotic heart disease of native coronary artery without angina pectoris: Secondary | ICD-10-CM | POA: Diagnosis present

## 2019-12-18 DIAGNOSIS — G4733 Obstructive sleep apnea (adult) (pediatric): Secondary | ICD-10-CM | POA: Diagnosis present

## 2019-12-18 DIAGNOSIS — I6389 Other cerebral infarction: Secondary | ICD-10-CM | POA: Diagnosis not present

## 2019-12-18 DIAGNOSIS — Z923 Personal history of irradiation: Secondary | ICD-10-CM

## 2019-12-18 DIAGNOSIS — M25561 Pain in right knee: Secondary | ICD-10-CM | POA: Diagnosis present

## 2019-12-18 DIAGNOSIS — Z955 Presence of coronary angioplasty implant and graft: Secondary | ICD-10-CM

## 2019-12-18 DIAGNOSIS — I10 Essential (primary) hypertension: Secondary | ICD-10-CM | POA: Diagnosis present

## 2019-12-18 DIAGNOSIS — J341 Cyst and mucocele of nose and nasal sinus: Secondary | ICD-10-CM | POA: Diagnosis not present

## 2019-12-18 DIAGNOSIS — I5032 Chronic diastolic (congestive) heart failure: Secondary | ICD-10-CM | POA: Diagnosis present

## 2019-12-18 DIAGNOSIS — E669 Obesity, unspecified: Secondary | ICD-10-CM | POA: Diagnosis present

## 2019-12-18 DIAGNOSIS — I252 Old myocardial infarction: Secondary | ICD-10-CM

## 2019-12-18 DIAGNOSIS — Z7902 Long term (current) use of antithrombotics/antiplatelets: Secondary | ICD-10-CM

## 2019-12-18 DIAGNOSIS — M129 Arthropathy, unspecified: Secondary | ICD-10-CM | POA: Diagnosis present

## 2019-12-18 DIAGNOSIS — D1802 Hemangioma of intracranial structures: Secondary | ICD-10-CM | POA: Diagnosis not present

## 2019-12-18 DIAGNOSIS — Z6833 Body mass index (BMI) 33.0-33.9, adult: Secondary | ICD-10-CM

## 2019-12-18 DIAGNOSIS — E785 Hyperlipidemia, unspecified: Secondary | ICD-10-CM | POA: Diagnosis present

## 2019-12-18 DIAGNOSIS — M5126 Other intervertebral disc displacement, lumbar region: Secondary | ICD-10-CM | POA: Diagnosis present

## 2019-12-18 DIAGNOSIS — K219 Gastro-esophageal reflux disease without esophagitis: Secondary | ICD-10-CM | POA: Diagnosis present

## 2019-12-18 DIAGNOSIS — I130 Hypertensive heart and chronic kidney disease with heart failure and stage 1 through stage 4 chronic kidney disease, or unspecified chronic kidney disease: Secondary | ICD-10-CM | POA: Diagnosis present

## 2019-12-18 DIAGNOSIS — Y92009 Unspecified place in unspecified non-institutional (private) residence as the place of occurrence of the external cause: Secondary | ICD-10-CM

## 2019-12-18 DIAGNOSIS — Z8249 Family history of ischemic heart disease and other diseases of the circulatory system: Secondary | ICD-10-CM

## 2019-12-18 DIAGNOSIS — I951 Orthostatic hypotension: Secondary | ICD-10-CM | POA: Diagnosis present

## 2019-12-18 DIAGNOSIS — Z862 Personal history of diseases of the blood and blood-forming organs and certain disorders involving the immune mechanism: Secondary | ICD-10-CM

## 2019-12-18 DIAGNOSIS — R262 Difficulty in walking, not elsewhere classified: Secondary | ICD-10-CM

## 2019-12-18 DIAGNOSIS — M109 Gout, unspecified: Secondary | ICD-10-CM | POA: Diagnosis present

## 2019-12-18 DIAGNOSIS — M5030 Other cervical disc degeneration, unspecified cervical region: Secondary | ICD-10-CM | POA: Diagnosis present

## 2019-12-18 DIAGNOSIS — M4807 Spinal stenosis, lumbosacral region: Secondary | ICD-10-CM | POA: Diagnosis present

## 2019-12-18 DIAGNOSIS — Z7982 Long term (current) use of aspirin: Secondary | ICD-10-CM

## 2019-12-18 LAB — CBC
HCT: 36.8 % — ABNORMAL LOW (ref 39.0–52.0)
Hemoglobin: 11.7 g/dL — ABNORMAL LOW (ref 13.0–17.0)
MCH: 27.9 pg (ref 26.0–34.0)
MCHC: 31.8 g/dL (ref 30.0–36.0)
MCV: 87.8 fL (ref 80.0–100.0)
Platelets: 200 10*3/uL (ref 150–400)
RBC: 4.19 MIL/uL — ABNORMAL LOW (ref 4.22–5.81)
RDW: 14 % (ref 11.5–15.5)
WBC: 3.6 10*3/uL — ABNORMAL LOW (ref 4.0–10.5)
nRBC: 0 % (ref 0.0–0.2)

## 2019-12-18 LAB — BASIC METABOLIC PANEL WITH GFR
Anion gap: 11 (ref 5–15)
BUN: 21 mg/dL (ref 8–23)
CO2: 29 mmol/L (ref 22–32)
Calcium: 9.4 mg/dL (ref 8.9–10.3)
Chloride: 99 mmol/L (ref 98–111)
Creatinine, Ser: 1.63 mg/dL — ABNORMAL HIGH (ref 0.61–1.24)
GFR calc Af Amer: 48 mL/min — ABNORMAL LOW
GFR calc non Af Amer: 42 mL/min — ABNORMAL LOW
Glucose, Bld: 131 mg/dL — ABNORMAL HIGH (ref 70–99)
Potassium: 3.4 mmol/L — ABNORMAL LOW (ref 3.5–5.1)
Sodium: 139 mmol/L (ref 135–145)

## 2019-12-18 LAB — URINALYSIS, ROUTINE W REFLEX MICROSCOPIC
Bilirubin Urine: NEGATIVE
Glucose, UA: NEGATIVE mg/dL
Hgb urine dipstick: NEGATIVE
Ketones, ur: NEGATIVE mg/dL
Leukocytes,Ua: NEGATIVE
Nitrite: NEGATIVE
Protein, ur: NEGATIVE mg/dL
Specific Gravity, Urine: 1.012 (ref 1.005–1.030)
pH: 5 (ref 5.0–8.0)

## 2019-12-18 MED ORDER — SODIUM CHLORIDE 0.9% FLUSH: 3.0000 mL | Freq: Once | INTRAVENOUS | Status: DC

## 2019-12-18 NOTE — ED Notes (Signed)
Pt wanting to leave. Spoke to pt and family member. Pt agreed to stay.

## 2019-12-18 NOTE — Telephone Encounter (Signed)
Called patient no answer do not see where at ED with [facility] office yet

## 2019-12-18 NOTE — Telephone Encounter (Signed)
Guidelines Guideline Title Affirmed Question Affirmed Notes Nurse Date/Time Eilene Ghazi Time) Chest Pain [1] Chest pain lasts > 5 minutes AND [2] age > 59 Durene Cal, RN, [place] 12/18/2019 11:53:46 AM Disp. Time Eilene Ghazi Time) Disposition Final User 12/18/2019 11:49:14 AM Send to Urgent Queue Windy Canny 12/18/2019 11:58:06 AM Send To RN Personal Durene Cal, RN, [place] 12/18/2019 12:04:18 PM [place], RN, Brandi Reason: no answer 12/18/2019 11:56:59 AM Call EMS 63 Now Yes Durene Cal, RN, [place]die Ogren Disagree/Comply Comply Caller Understands Yes PreDisposition Go to ED Care Advice Given Per Guideline CALL EMS 911 NOW: * Immediate medical attention is needed. You need to hang up and call 911 (or an ambulance). * Triager Discretion: I'll call you back in a few minutes to be sure you were able to reach them.PLEASE NOTE: All timestamps contained within this report are represented as [place] Standard Time. CONFIDENTIALTY NOTICE: This fax transmission is intended only for the addressee. It contains information that is legally privileged, confidential or otherwise protected from use or disclosure. If you are not the intended recipient, you are strictly prohibited from reviewing, disclosing, copying using or disseminating any of this information or taking any action in reliance on or regarding this information. If you have received this fax in error, please notify us immediately by telephone so that we can arrange for its return to [place]. Phone: [phone], Toll-Free: [phone], Fax: [phone] Page: 2 of 2 Call Id: [id number] Comments User: Ermalene Postin, RN Date/Time Eilene Ghazi Time): 12/18/2019 11:57:46 AM Caller reports she was planning to go to her urology appt at 12:30 then to the ED after that. User: Ermalene Postin, RN Date/Time Eilene Ghazi Time): 12/18/2019 12:05:22 PM EMS advised, caller agreed, then no answer at follow up. Referrals GO TO FACILITY UNDECIDE

## 2019-12-18 NOTE — ED Triage Notes (Signed)
Pt bib ems from home with reports of weakness and bil leg pain X2 weeks. Increased falls. Denies hitting head. No LOC. Pt is on plavix.  152/88 98% RA 96 HR CBG 152

## 2019-12-19 ENCOUNTER — Emergency Department (HOSPITAL_COMMUNITY): Payer: No Typology Code available for payment source

## 2019-12-19 ENCOUNTER — Observation Stay (HOSPITAL_BASED_OUTPATIENT_CLINIC_OR_DEPARTMENT_OTHER): Payer: No Typology Code available for payment source

## 2019-12-19 DIAGNOSIS — R072 Precordial pain: Secondary | ICD-10-CM

## 2019-12-19 DIAGNOSIS — R079 Chest pain, unspecified: Secondary | ICD-10-CM | POA: Diagnosis present

## 2019-12-19 DIAGNOSIS — E1142 Type 2 diabetes mellitus with diabetic polyneuropathy: Secondary | ICD-10-CM

## 2019-12-19 DIAGNOSIS — I2510 Atherosclerotic heart disease of native coronary artery without angina pectoris: Secondary | ICD-10-CM

## 2019-12-19 DIAGNOSIS — I639 Cerebral infarction, unspecified: Secondary | ICD-10-CM

## 2019-12-19 DIAGNOSIS — W19XXXA Unspecified fall, initial encounter: Secondary | ICD-10-CM

## 2019-12-19 DIAGNOSIS — N400 Enlarged prostate without lower urinary tract symptoms: Secondary | ICD-10-CM | POA: Diagnosis not present

## 2019-12-19 DIAGNOSIS — I77810 Thoracic aortic ectasia: Secondary | ICD-10-CM

## 2019-12-19 DIAGNOSIS — K219 Gastro-esophageal reflux disease without esophagitis: Secondary | ICD-10-CM

## 2019-12-19 DIAGNOSIS — M109 Gout, unspecified: Secondary | ICD-10-CM

## 2019-12-19 DIAGNOSIS — Z8673 Personal history of transient ischemic attack (TIA), and cerebral infarction without residual deficits: Secondary | ICD-10-CM

## 2019-12-19 DIAGNOSIS — I6389 Other cerebral infarction: Secondary | ICD-10-CM | POA: Diagnosis not present

## 2019-12-19 DIAGNOSIS — Y92009 Unspecified place in unspecified non-institutional (private) residence as the place of occurrence of the external cause: Secondary | ICD-10-CM

## 2019-12-19 DIAGNOSIS — S8991XA Unspecified injury of right lower leg, initial encounter: Secondary | ICD-10-CM | POA: Diagnosis not present

## 2019-12-19 DIAGNOSIS — G4733 Obstructive sleep apnea (adult) (pediatric): Secondary | ICD-10-CM

## 2019-12-19 DIAGNOSIS — M129 Arthropathy, unspecified: Secondary | ICD-10-CM

## 2019-12-19 DIAGNOSIS — G319 Degenerative disease of nervous system, unspecified: Secondary | ICD-10-CM | POA: Diagnosis not present

## 2019-12-19 DIAGNOSIS — C61 Malignant neoplasm of prostate: Secondary | ICD-10-CM

## 2019-12-19 DIAGNOSIS — D1802 Hemangioma of intracranial structures: Secondary | ICD-10-CM | POA: Diagnosis not present

## 2019-12-19 DIAGNOSIS — I10 Essential (primary) hypertension: Secondary | ICD-10-CM

## 2019-12-19 DIAGNOSIS — J341 Cyst and mucocele of nose and nasal sinus: Secondary | ICD-10-CM | POA: Diagnosis not present

## 2019-12-19 DIAGNOSIS — F4310 Post-traumatic stress disorder, unspecified: Secondary | ICD-10-CM

## 2019-12-19 DIAGNOSIS — M545 Low back pain: Secondary | ICD-10-CM | POA: Diagnosis not present

## 2019-12-19 DIAGNOSIS — E785 Hyperlipidemia, unspecified: Secondary | ICD-10-CM

## 2019-12-19 DIAGNOSIS — M25562 Pain in left knee: Secondary | ICD-10-CM | POA: Diagnosis not present

## 2019-12-19 DIAGNOSIS — Z862 Personal history of diseases of the blood and blood-forming organs and certain disorders involving the immune mechanism: Secondary | ICD-10-CM

## 2019-12-19 LAB — ECHOCARDIOGRAM COMPLETE
Area-P 1/2: 5.38 cm2
S' Lateral: 2.1 cm
Single Plane A4C EF: 78.2 %

## 2019-12-19 LAB — GLUCOSE, CAPILLARY: Glucose-Capillary: 177 mg/dL — ABNORMAL HIGH (ref 70–99)

## 2019-12-19 LAB — IRON AND TIBC
Iron: 45 ug/dL (ref 45–182)
Saturation Ratios: 16 % — ABNORMAL LOW (ref 17.9–39.5)
TIBC: 280 ug/dL (ref 250–450)
UIBC: 235 ug/dL

## 2019-12-19 LAB — CBG MONITORING, ED
Glucose-Capillary: 97 mg/dL (ref 70–99)
Glucose-Capillary: 120 mg/dL — ABNORMAL HIGH (ref 70–99)

## 2019-12-19 LAB — TROPONIN I (HIGH SENSITIVITY)
Troponin I (High Sensitivity): 12 ng/L (ref <18)
Troponin I (High Sensitivity): 14 ng/L (ref <18)

## 2019-12-19 LAB — POC OCCULT BLOOD, ED: Fecal Occult Bld: POSITIVE — AB

## 2019-12-19 LAB — SARS CORONAVIRUS 2 BY RT PCR (HOSPITAL ORDER, PERFORMED IN ~~LOC~~ HOSPITAL LAB): SARS Coronavirus 2: NEGATIVE

## 2019-12-19 LAB — MRSA PCR SCREENING: MRSA by PCR: NEGATIVE

## 2019-12-19 MED ORDER — GABAPENTIN 300 MG PO CAPS: 300.0000 mg | ORAL_CAPSULE | Freq: Two times a day (BID) | ORAL | Status: DC

## 2019-12-19 MED ORDER — INSULIN ASPART PROT & ASPART (70-30 MIX) 100 UNIT/ML ~~LOC~~ SUSP
50.0000 [IU] | Freq: Two times a day (BID) | SUBCUTANEOUS | Status: DC
  Administered 2019-12-19 – 2019-12-22 (×5): 50 [IU] via SUBCUTANEOUS
  Filled 2019-12-19 (×2): qty 10

## 2019-12-19 MED ORDER — VITAMIN D (ERGOCALCIFEROL) 1.25 MG (50000 UNIT) PO CAPS
50000.0000 [IU] | ORAL_CAPSULE | ORAL | Status: DC
  Filled 2019-12-19: qty 1

## 2019-12-19 MED ORDER — DIAZEPAM 5 MG PO TABS: 5.0000 mg | ORAL_TABLET | Freq: Three times a day (TID) | ORAL | Status: DC | PRN

## 2019-12-19 MED ORDER — ASPIRIN EC 81 MG PO TBEC
81.0000 mg | DELAYED_RELEASE_TABLET | Freq: Every day | ORAL | Status: DC
  Administered 2019-12-19 – 2019-12-21 (×3): 81 mg via ORAL
  Filled 2019-12-19 (×3): qty 1

## 2019-12-19 MED ORDER — ATORVASTATIN CALCIUM 40 MG PO TABS
40.0000 mg | ORAL_TABLET | Freq: Every day | ORAL | Status: DC
  Administered 2019-12-19 – 2019-12-22 (×4): 40 mg via ORAL
  Filled 2019-12-19 (×4): qty 1

## 2019-12-19 MED ORDER — ACETAMINOPHEN 500 MG PO TABS: 1000.0000 mg | ORAL_TABLET | Freq: Four times a day (QID) | ORAL | Status: DC | PRN

## 2019-12-19 MED ORDER — TACROLIMUS 1 MG PO CAPS
5.0000 mg | ORAL_CAPSULE | Freq: Two times a day (BID) | ORAL | Status: DC
  Administered 2019-12-19 – 2019-12-22 (×7): 5 mg via ORAL
  Filled 2019-12-19 (×7): qty 5

## 2019-12-19 MED ORDER — CLOPIDOGREL BISULFATE 75 MG PO TABS
75.0000 mg | ORAL_TABLET | Freq: Every day | ORAL | Status: DC
  Administered 2019-12-19 – 2019-12-22 (×4): 75 mg via ORAL
  Filled 2019-12-19 (×4): qty 1

## 2019-12-19 MED ORDER — ALBUTEROL SULFATE (2.5 MG/3ML) 0.083% IN NEBU: 2.5000 mg | INHALATION_SOLUTION | RESPIRATORY_TRACT | Status: DC | PRN

## 2019-12-19 MED ORDER — PANTOPRAZOLE SODIUM 40 MG PO TBEC
40.0000 mg | DELAYED_RELEASE_TABLET | Freq: Every day | ORAL | Status: DC
  Administered 2019-12-19 – 2019-12-22 (×4): 40 mg via ORAL
  Filled 2019-12-19 (×4): qty 1

## 2019-12-19 MED ORDER — NORTRIPTYLINE HCL 25 MG PO CAPS
100.0000 mg | ORAL_CAPSULE | Freq: Every day | ORAL | Status: DC
  Administered 2019-12-19 – 2019-12-21 (×3): 100 mg via ORAL
  Filled 2019-12-19 (×5): qty 4

## 2019-12-19 MED ORDER — FUROSEMIDE 40 MG PO TABS
40.0000 mg | ORAL_TABLET | Freq: Two times a day (BID) | ORAL | Status: DC
  Administered 2019-12-19 – 2019-12-20 (×3): 40 mg via ORAL
  Filled 2019-12-19: qty 2
  Filled 2019-12-19: qty 1
  Filled 2019-12-19: qty 2
  Filled 2019-12-19: qty 1

## 2019-12-19 MED ORDER — MAGNESIUM OXIDE 400 (241.3 MG) MG PO TABS
200.0000 mg | ORAL_TABLET | Freq: Every evening | ORAL | Status: DC
  Administered 2019-12-19 – 2019-12-21 (×2): 200 mg via ORAL
  Filled 2019-12-19 (×2): qty 1

## 2019-12-19 MED ORDER — GABAPENTIN 300 MG PO CAPS
300.0000 mg | ORAL_CAPSULE | Freq: Three times a day (TID) | ORAL | Status: DC
  Administered 2019-12-19 – 2019-12-22 (×10): 300 mg via ORAL
  Filled 2019-12-19 (×11): qty 1

## 2019-12-19 MED ORDER — BISACODYL 10 MG RE SUPP: 10.0000 mg | Freq: Every day | RECTAL | Status: DC | PRN

## 2019-12-19 MED ORDER — ALLOPURINOL 100 MG PO TABS
200.0000 mg | ORAL_TABLET | Freq: Every day | ORAL | Status: DC
  Administered 2019-12-19 – 2019-12-22 (×4): 200 mg via ORAL
  Filled 2019-12-19 (×4): qty 2

## 2019-12-19 MED ORDER — ENOXAPARIN SODIUM 40 MG/0.4ML ~~LOC~~ SOLN
40.0000 mg | SUBCUTANEOUS | Status: DC
  Administered 2019-12-19 – 2019-12-22 (×4): 40 mg via SUBCUTANEOUS
  Filled 2019-12-19 (×4): qty 0.4

## 2019-12-19 MED ORDER — LIDOCAINE 5 % EX PTCH
1.0000 | MEDICATED_PATCH | CUTANEOUS | Status: DC
  Administered 2019-12-19 – 2019-12-22 (×4): 1 via TRANSDERMAL
  Filled 2019-12-19 (×5): qty 1

## 2019-12-19 MED ORDER — POLYETHYLENE GLYCOL 3350 17 G PO PACK: 17.0000 g | PACK | Freq: Every day | ORAL | Status: DC | PRN

## 2019-12-19 MED ORDER — MYCOPHENOLATE SODIUM 180 MG PO TBEC
180.0000 mg | DELAYED_RELEASE_TABLET | Freq: Two times a day (BID) | ORAL | Status: DC
  Administered 2019-12-19 (×2): 180 mg via ORAL
  Filled 2019-12-19 (×4): qty 1

## 2019-12-19 MED ORDER — HYDROCODONE-ACETAMINOPHEN 5-325 MG PO TABS: 1.0000 | ORAL_TABLET | ORAL | Status: DC | PRN

## 2019-12-19 MED ORDER — HYDRALAZINE HCL 20 MG/ML IJ SOLN: 5.0000 mg | Freq: Four times a day (QID) | INTRAMUSCULAR | Status: DC | PRN

## 2019-12-19 MED ORDER — NITROGLYCERIN 0.4 MG SL SUBL: 0.4000 mg | SUBLINGUAL_TABLET | SUBLINGUAL | Status: DC | PRN

## 2019-12-19 MED ORDER — ONDANSETRON HCL 4 MG PO TABS: 4.0000 mg | ORAL_TABLET | Freq: Four times a day (QID) | ORAL | Status: DC | PRN

## 2019-12-19 MED ORDER — ONDANSETRON HCL 4 MG/2ML IJ SOLN: 4.0000 mg | Freq: Four times a day (QID) | INTRAMUSCULAR | Status: DC | PRN

## 2019-12-19 NOTE — ED Notes (Signed)
Patient transported to X-ray 

## 2019-12-19 NOTE — ED Notes (Signed)
Pt belongings placed at bedside, daughter at bedside.

## 2019-12-19 NOTE — ED Notes (Signed)
Pt reports having hx of hemorrhoids with noted red blood intermittently to stool. Pt denies seeing blood in stool today.

## 2019-12-19 NOTE — ED Notes (Signed)
Lunch Tray Ordered @ 1027. °

## 2019-12-19 NOTE — ED Notes (Signed)
Multiple IV attempts unsuccessful  

## 2019-12-19 NOTE — ED Provider Notes (Signed)
[facility] EMERGENCY DEPARTMENT Provider Note   CSN: [id number] Arrival date & time: 12/18/19  1313     History Chief Complaint  Patient presents with  . Weakness  . Leg Pain    [name] is a 69 y.o. male.  Patient with type 2 diabetes mellitus complicated by polyneuropathy, renal failure status post transplant 2017, gastroparesis, CHF, CAD, and history of stroke presenting from home with multiple complaints.  Patient states he has had increasing generalized weakness and falls over the past 1 month.  Reports 3 falls in the past 4 days due to "my left leg giving out".  States he feels dizzy and off balance and having difficulty walking.  Does have a history of polyneuropathy and diabetes and normally walks with a walker.  States over the past several days he has had several falls due to his left leg giving out and feeling dizzy and lightheaded.  Denies hitting his head.  Fall on Thursday fall yesterday.  He did have a fall today as well.  EMS was called to help get him up.  He also had an episode of chest pain today while putting on his shirt after he had a fall.  Estimates it lasted for about 15 to 30 minutes and has since resolved.  He does get exertional chest pain frequently.  He is not having any chest pain currently.  States the pain in his back and legs is unchanged.  His neurologist recently stopped his gabapentin and is putting him on nortriptyline to help with his tremors.  He denies any change in his chronic back pain or leg pain.  Denies any bowel or bladder incontinence.  Denies any fevers, chills, nausea or vomiting.  Does have a history of kidney transplant on antirejection medication.  History of prostate cancer status post radiation.  Denies any difficulty speaking or difficulty swallowing.  Denies any focal weakness in his arms or legs.  Denies any room spinning dizziness.  Patient recently saw his neurologist Dr. Tomi Likens 12/08/2019. MRI of lumbar spine from  10/07/2018 showed slight progression of degenerative disc disease with mild left-predominant multilevel neural foraminal stenosis but no spinal stenosis.  He also endorsed neck pain, so MRI of cervical spine was also performed, which demonstrated mild degenerative disc disease predominantly at C5-6 with mild bilateral neural foraminal stenosis but no spinal stenosis.  The history is provided by the patient.  Weakness Associated symptoms: arthralgias, chest pain, dizziness and myalgias   Associated symptoms: no abdominal pain, no dysuria, no fever, no nausea, no shortness of breath and no vomiting   Leg Pain Associated symptoms: back pain, fatigue and neck pain   Associated symptoms: no fever        Past Medical History:  Diagnosis Date  . Allergy   . Angina   . Arthritis   . Backache, unspecified   . CHF (congestive heart failure) ([place])   . Chills   . Chronic kidney disease, stage III (moderate)    HD T- TH-SAT  . Complication of anesthesia    01/2011 could not eat,, hospt x2, was placed on hd and cleared up  . Coronary atherosclerosis of native coronary artery   . Diabetes mellitus 2004  . Dizziness   . Dysphagia, unspecified(787.20)   . Gastroparesis   . Headache(784.0)   . Hemorrhage of rectum and anus   . Hyperlipidemia   . Hypertrophy of prostate with urinary obstruction and other lower urinary tract symptoms (LUTS)   .  INTERNAL HEMORRHOIDS 10/16/2008   Qualifier: Diagnosis of  By: Nolon Rod CMA (AAMA), Robin    . Internal hemorrhoids without mention of complication   . Myocardial infarction ([place]) 2002  . Nausea alone   . Obesity   . Other dyspnea and respiratory abnormality   . Other malaise and fatigue   . Personal history of unspecified circulatory disease   . Posttraumatic stress disorder   . Prostate cancer ([place])   . Sleep apnea    USES CPAP   . Stroke [facility])    2007  . Unspecified essential hypertension    hx htn     Patient Active Problem List   Diagnosis  Date Noted  . Chronic arthropathy 09/19/2019  . Malignant neoplasm of prostate ([place]) 06/09/2019  . Coronary artery disease of native artery of native heart with stable angina pectoris ([place]) 01/16/2019  . PAD (peripheral artery disease) ([place]) 01/16/2019  . Spinal stenosis of lumbar region 09/21/2018  . Morbid obesity ([place]) 12/04/2017  . Mild cognitive impairment 06/18/2016  . Sore throat 03/19/2016  . Other viral infections of unspecified site 02/20/2016  . Hyperlipidemia 01/16/2016  . Fall 01/11/2016  . Fever 01/11/2016  . Tremor 08/15/2015  . Peripheral neuropathy 07/16/2015  . Immunosuppressive management encounter following kidney transplant 07/12/2015  . Fluid overload 07/12/2015  . Renal transplant recipient 06/27/2015  . Immunosuppression ([place]) 06/27/2015  . Orthostatic hypotension 01/15/2015  . Fall at home 01/15/2015  . 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 GERD (gastroesophageal reflux disease) 05/16/2014  . Trigger thumb of right hand 01/03/2014  . Left foot drop 05/02/2013  . Abnormal CT scan, kidney 04/03/2013  . ED (erectile dysfunction) 04/03/2013  . History of CVA (cerebrovascular accident) 04/03/2013  . History of heart artery stent 04/03/2013  . Obesity (BMI 30-39.9) 04/03/2013  . H/O iron deficiency anemia 02/10/2012  . Secondary hyperparathyroidism ([place]) 02/10/2012  . Risk for falls 01/22/2012  . Diabetic neuropathy ([place]) 10/14/2010  . Gout 12/22/2009  . Obstructive sleep apnea 07/10/2009  . CAD -s/p DES to marginal vessel at [facility] 2014 04/09/2009  . BPH (benign prostatic hyperplasia) 09/25/2008  . Gastroparesis 06/19/2008  . Low back pain 12/15/2007  . Posttraumatic stress disorder 11/03/2007  . Diabetes mellitus due to underlying condition with diabetic autonomic (poly)neuropathy ([place]) 09/05/2007  . Allergic rhinitis 03/01/2007  . Essential hypertension 01/17/2007  . CVA (cerebral infarction) 01/17/2007    Past  Surgical History:  Procedure Laterality Date  . ANAL FISSURECTOMY    . AV FISTULA PLACEMENT    . CARDIAC CATHETERIZATION     2005 DR BRODIE  . HEMORRHOID SURGERY    . PROSTATE BIOPSY    . revision of fistula     renal failure  . VENTRAL HERNIA REPAIR  07/01/2011   Procedure: HERNIA REPAIR VENTRAL ADULT;  Surgeon: Joyice Faster. Cornett, MD;  Location: [facility] OR;  Service: General;  Laterality: N/A;       Family History  Problem Relation Age of Onset  . Heart disease Father   . Renal Disease Father   . Dementia Mother   . Heart attack Mother   . Diabetes Sister   . Heart disease Sister   . Diabetes Maternal Aunt   . Diabetes Maternal Uncle   . Diabetes Paternal Aunt   . Diabetes Paternal Uncle   . Heart disease Other   . Heart attack Brother   . Lung cancer Sister   . Renal Disease Brother   .  Ovarian cancer Cousin   . Lung cancer Cousin   . Breast cancer Neg Hx   . Colon cancer Neg Hx   . Pancreatic cancer Neg Hx   . Prostate cancer Neg Hx     Social History   Tobacco Use  . Smoking status: Never Smoker  . Smokeless tobacco: Never Used  Vaping Use  . Vaping Use: Never used  Substance Use Topics  . Alcohol use: No  . Drug use: No    Home Medications Prior to Admission medications   Medication Sig Start Date End Date Taking? Authorizing Provider  acetaminophen (TYLENOL) 500 MG tablet Take 1,000 mg by mouth as needed for mild pain or headache.    Yes [provider]  allopurinol (ZYLOPRIM) 100 MG tablet Take 200 mg by mouth daily.    Yes [provider]  amLODipine (NORVASC) 10 MG tablet Take 10 mg by mouth daily. 08/13/16  Yes [provider]  aspirin 81 MG tablet Take 81 mg by mouth at bedtime.    Yes [provider]  atorvastatin (LIPITOR) 40 MG tablet Take 40 mg by mouth daily.   Yes [provider]  clopidogrel (PLAVIX) 75 MG tablet Take 75 mg by mouth daily.    Yes [provider]  diazepam (VALIUM) 5 MG tablet  Take 1 tablet (5 mg total) by mouth at bedtime. Patient taking differently: Take 5 mg by mouth every 8 (eight) hours as needed for muscle spasms.  07/04/15  Yes [name], MD  furosemide (LASIX) 40 MG tablet Take 1 tablet (40 mg total) by mouth daily. Patient taking differently: Take 40 mg by mouth 2 (two) times daily.  11/23/17  Yes Cherene Altes, MD  gabapentin (NEURONTIN) 300 MG capsule Take 2 capsules in morning, 1 capsule in afternoon, and 2 capsules at night. Patient taking differently: Take 300 mg by mouth See admin instructions. Take two tablets by mouth in the morning, then take 1 tablet by mouth midday, then take 2 tablets by mouth at night per patient 08/09/19  Yes Tomi Likens, Adam R, DO  glucose blood (ACCU-CHEK AVIVA PLUS) test strip USE TO TEST BLOOD SUGAR 3 TIMES A DAY 05/09/19  Yes [name], MD  insulin aspart protamine- aspart (NOVOLOG MIX 70/30) (70-30) 100 UNIT/ML injection Inject 75 Units into the skin 2 (two) times daily with a meal.    Yes [provider]  Insulin Syringe-Needle U-100 (B-D INS SYR ULTRAFINE 1CC/31G) 31G X 5/16" 1 ML MISC Used to give insulin injections twice daily. 09/29/17  Yes Renato Shin, MD  Magnesium 200 MG TABS Take 200 mg by mouth every evening.    Yes [provider]  mycophenolate (MYFORTIC) 180 MG EC tablet Take 180 mg by mouth in the morning, at noon, and at bedtime.    Yes [provider]  nitroGLYCERIN (NITROSTAT) 0.4 MG SL tablet PLACE 1 TABLET (0.4 MG TOTAL) UNDER THE TONGUE EVERY 5 (FIVE) MINUTES AS NEEDED FOR CHEST PAIN. 01/25/18  Yes Skeet Latch, MD  nortriptyline (PAMELOR) 50 MG capsule Take 2 capsules (100 mg total) by mouth at bedtime. 12/08/19  Yes Tomi Likens, Adam R, DO  omeprazole (PRILOSEC) 20 MG capsule Take 1 capsule (20 mg total) by mouth 2 (two) times daily before a meal. 03/01/19  Yes [name], MD  Semaglutide,0.25 or 0.5MG /DOS, (OZEMPIC, 0.25 OR 0.5 MG/DOSE,) 2 MG/1.5ML SOPN Inject 1 mg  into the skin once a week.    Yes [provider]  tacrolimus (PROGRAF) 5 MG capsule Take 5 mg by mouth 2 (two) times daily.  11/26/17  Yes [provider]  Vitamin D, Ergocalciferol, (DRISDOL) 1.25 MG (50000 UNIT) CAPS capsule Take 50,000 Units by mouth 2 (two) times a week. 04/26/19  Yes [provider]    Allergies    Patient has no known allergies.  Review of Systems   Review of Systems  Constitutional: Positive for activity change, appetite change and fatigue. Negative for fever.  HENT: Negative for congestion and rhinorrhea.   Respiratory: Positive for chest tightness. Negative for shortness of breath.   Cardiovascular: Positive for chest pain.  Gastrointestinal: Negative for abdominal pain, nausea and vomiting.  Genitourinary: Negative for dysuria and hematuria.  Musculoskeletal: Positive for arthralgias, back pain, gait problem, myalgias and neck pain.  Skin: Negative for rash.  Neurological: Positive for dizziness, tremors, weakness and light-headedness.   all other systems are negative except as noted in the HPI and PMH.    Physical Exam Updated Vital Signs BP 125/74 (BP Location: Left Arm)   Pulse 84   Temp (!) 97.5 F (36.4 C) (Oral)   Resp 18   SpO2 100%   Physical Exam Vitals and nursing note reviewed.  Constitutional:      General: He is not in acute distress.    Appearance: He is well-developed.  HENT:     Head: Normocephalic and atraumatic.     Mouth/Throat:     Pharynx: No oropharyngeal exudate.  Eyes:     Conjunctiva/sclera: Conjunctivae normal.     Pupils: Pupils are equal, round, and reactive to light.  Neck:     Comments: No meningismus. Cardiovascular:     Rate and Rhythm: Normal rate and regular rhythm.     Heart sounds: Normal heart sounds. No murmur heard.   Pulmonary:     Effort: Pulmonary effort is normal. No respiratory distress.     Breath sounds: Normal breath sounds.  Abdominal:     Palpations: Abdomen is  soft.     Tenderness: There is no abdominal tenderness. There is no guarding or rebound.  Musculoskeletal:        General: Tenderness present. Normal range of motion.     Cervical back: Normal range of motion and neck supple.     Comments: Diffuse thoracic and lumbar paraspinal tenderness, no midline pain or step-off.  4/5 strength in bilateral lower extremities. Ankle plantar and dorsiflexion intact. Great toe extension intact bilaterally.  PT pulses present with Doppler.  Unable to obtain reflexes bilaterally.  Gait not tested.  Skin:    General: Skin is warm.  Neurological:     Mental Status: He is alert and oriented to person, place, and time.     Cranial Nerves: No cranial nerve deficit.     Motor: No abnormal muscle tone.     Coordination: Coordination normal.     Comments: Cranial nerves II to XII intact, no tongue asymmetry, eyebrow raises equal bilaterally. 5/5 strength in upper extremities.  Intention tremor bilaterally.  No ataxia on finger-to-nose.  No pronator drift.  Pain with lifting leg off the bed bilaterally.  Able to wiggle toes and flex ankles bilaterally.  Psychiatric:        Behavior: Behavior normal.     ED Results / Procedures / Treatments   Labs (all labs ordered are listed, but only abnormal results are displayed) Labs Reviewed  BASIC METABOLIC PANEL - Abnormal; Notable for the following components:  Result Value   Potassium 3.4 (*)    Glucose, Bld 131 (*)    Creatinine, Ser 1.63 (*)    GFR calc non Af Amer 42 (*)    GFR calc Af Amer 48 (*)    All other components within normal limits  CBC - Abnormal; Notable for the following components:   WBC 3.6 (*)    RBC 4.19 (*)    Hemoglobin 11.7 (*)    HCT 36.8 (*)    All other components within normal limits  POC OCCULT BLOOD, ED - Abnormal; Notable for the following components:   Fecal Occult Bld POSITIVE (*)    All other components within normal limits  SARS CORONAVIRUS 2 BY RT PCR (HOSPITAL ORDER,  [facility])  URINALYSIS, ROUTINE W REFLEX MICROSCOPIC  CBG MONITORING, ED  TROPONIN I (HIGH SENSITIVITY)  TROPONIN I (HIGH SENSITIVITY)    EKG EKG Interpretation  Date/Time:  Monday December 18 2019 13:42:20 EDT Ventricular Rate:  94 PR Interval:  204 QRS Duration: 102 QT Interval:  382 QTC Calculation: 477 R Axis:   -40 Text Interpretation: Normal sinus rhythm Left axis deviation Abnormal QRS-T angle, consider primary T wave abnormality Abnormal ECG No significant change was found Confirmed by Ezequiel Essex [phone]) on 12/19/2019 12:06:29 AM   Radiology DG Chest 2 View  Result Date: 12/19/2019 CLINICAL DATA:  Recent fall with chest pain, initial encounter EXAM: CHEST - 2 VIEW COMPARISON:  11/13/2019 FINDINGS: Cardiac shadow is at the upper limits of normal in size. The lungs are well aerated bilaterally. No focal infiltrate or effusion is seen. No acute bony abnormality is noted. IMPRESSION: No acute abnormality noted. Electronically Signed   By: Inez Catalina M.D.   On: 12/19/2019 01:35   MR BRAIN WO CONTRAST  Result Date: 12/19/2019 CLINICAL DATA:  Initial evaluation for acute dizziness, weakness. EXAM: MRI HEAD WITHOUT CONTRAST TECHNIQUE: Multiplanar, multiecho pulse sequences of the brain and surrounding structures were obtained without intravenous contrast. COMPARISON:  Comparison made with prior MRI from 02/01/2015. FINDINGS: Brain: Generalized age-related cerebral atrophy. Mild patchy T2/FLAIR hyperintensity seen within the periventricular white matter most consistent with chronic small vessel ischemic disease, mild for age. Few small remote left cerebellar infarcts noted, left PICA territory. No abnormal foci of restricted diffusion to suggest acute or subacute ischemia. Gray-white matter differentiation maintained. No encephalomalacia to suggest chronic cortical infarction elsewhere within the brain. No susceptibility artifact to suggest acute or chronic  intracranial hemorrhage. No mass lesion, midline shift or mass effect. No hydrocephalus or extra-axial fluid collection. Pituitary gland suprasellar region normal. Midline structures intact. Vascular: Major intracranial vascular flow voids are maintained. Skull and upper cervical spine: Craniocervical junction within normal limits. Bone marrow signal intensity diffusely heterogeneous but overall within normal limits. Few scattered benign hemangiomata noted. No worrisome osseous lesions. No scalp soft tissue abnormality. Sinuses/Orbits: Globes and orbital soft tissues within normal limits. Retention cyst noted within the left maxillary sinus. Moderate mucosal thickening noted within the right maxillary sinus. Paranasal sinuses are otherwise clear. No mastoid effusion. Inner ear structures grossly normal. Other: None. IMPRESSION: 1. No acute intracranial abnormality. 2. Mild age-related cerebral atrophy with chronic small vessel ischemic disease, with a few small remote left cerebellar infarcts, left PICA territory. Electronically Signed   By: Jeannine Boga M.D.   On: 12/19/2019 04:13   MR LUMBAR SPINE WO CONTRAST  Result Date: 12/19/2019 CLINICAL DATA:  Initial evaluation for low back pain with left lower extremity weakness.  Increased falls. EXAM: MRI LUMBAR SPINE WITHOUT CONTRAST TECHNIQUE: Multiplanar, multisequence MR imaging of the lumbar spine was performed. No intravenous contrast was administered. COMPARISON:  Prior MRI from 10/07/2018. FINDINGS: Segmentation: Standard. Lowest well-formed disc space labeled the L5-S1 level. Alignment: 4 mm retrolisthesis of L5 on S1, stable. Associated trace anterolisthesis of L4 on L5, also unchanged. Alignment otherwise normal with preservation of the normal lumbar lordosis. Vertebrae: Vertebral body height maintained without evidence for acute or chronic fracture. Prominent chronic endplate Schmorl's node deformity noted at the superior endplate of L3, stable.  Post radiation changes seen within the visualized bone marrow extending from the L5 level inferiorly into the sacrum. Elsewhere, visualized bone marrow signal intensity is mildly heterogeneous but overall within normal limits. Few scattered benign hemangiomata noted. No worrisome osseous lesions. No abnormal marrow edema. Conus medullaris and cauda equina: Conus extends to the L1 level. Conus and cauda equina appear normal. Paraspinal and other soft tissues: Paraspinous soft tissues demonstrate no acute finding. Bilateral renal atrophy with multiple scattered cysts noted bilaterally, largest of which measures 2 cm at the interpolar right kidney. A few of these are mildly complex in appearance, likely reflecting proteinaceous and/or hemorrhagic cyst as seen on prior abdominal MRI. Prominent distension of the partially visualized urinary bladder. Disc levels: L1-2: Negative interspace. Mild facet hypertrophy. No canal or foraminal stenosis. L2-3: Mild diffuse disc bulge with disc desiccation. Disc bulging asymmetric to the left. Prominent endplate Schmorl's node deformity noted at the superior endplate of L3. Mild to moderate facet and ligament flavum hypertrophy. No significant spinal stenosis. Mild left L2 foraminal narrowing, unchanged. No significant right foraminal encroachment. L3-4: Disc desiccation with minimal disc bulge, slightly asymmetric to the left. Mild to moderate facet and ligament flavum hypertrophy. No significant spinal stenosis. Mild left L3 foraminal narrowing, stable. No significant right foraminal encroachment. L4-5: Mild diffuse disc bulge with disc desiccation. Superimposed small left foraminal disc protrusion is seen, slightly more prominent and focal in nature as compared to previous (series 8, image 27). This closely approximates the exiting left L4 nerve root. Moderate facet and ligament flavum hypertrophy with associated trace joint effusions. Resultant mild spinal stenosis, relatively  unchanged. Mild left greater than right L4 foraminal narrowing, similar. L5-S1: Trace retrolisthesis. Diffuse disc bulge with disc desiccation and intervertebral disc space narrowing. Mild reactive endplate spurring. Moderate bilateral facet hypertrophy. Resultant mild to moderate left greater than right lateral recess stenosis, descending S1 nerve root level. Moderate bilateral L5 foraminal narrowing, unchanged. IMPRESSION: 1. No acute abnormality within the lumbar spine. 2. Small left foraminal disc protrusion at L4-5, closely approximating and potentially irritating the exiting left L4 nerve root. This is slightly increased in size/prominence as compared to previous. 3. Multifactorial degenerative changes at L5-S1, resulting in mild to moderate left greater than right lateral recess stenosis, with moderate bilateral L5 foraminal narrowing. Overall appearance is stable from previous. 4. Mild left eccentric disc bulge and facet hypertrophy at L2-3 and L3-4 with resultant mild left L2 and L3 foraminal stenosis, similar to prior. Electronically Signed   By: Jeannine Boga M.D.   On: 12/19/2019 04:01   DG Knee Complete 4 Views Left  Result Date: 12/19/2019 CLINICAL DATA:  Recent fall with left knee pain, initial encounter EXAM: LEFT KNEE - COMPLETE 4+ VIEW COMPARISON:  None. FINDINGS: Mild medial joint space narrowing is noted. No acute fracture or dislocation is seen. No soft tissue abnormality is noted. IMPRESSION: Mild degenerative change without acute abnormality. Electronically Signed   By:  Inez Catalina M.D.   On: 12/19/2019 01:36   DG Knee Complete 4 Views Right  Result Date: 12/19/2019 CLINICAL DATA:  Recent fall with right knee pain, initial encounter EXAM: RIGHT KNEE - COMPLETE 4+ VIEW COMPARISON:  None. FINDINGS: Mild medial joint space narrowing is noted. No acute fracture or dislocation is seen. No soft tissue abnormality is noted. IMPRESSION: Mild medial joint space narrowing.  No acute  abnormality noted. Electronically Signed   By: Inez Catalina M.D.   On: 12/19/2019 01:34    Procedures Procedures (including critical care time)  Medications Ordered in ED Medications  sodium chloride flush (NS) 0.9 % injection 3 mL (has no administration in time range)    ED Course  I have reviewed the triage vital signs and the nursing notes.  Pertinent labs & imaging results that were available during my care of the patient were reviewed by me and considered in my medical decision making (see chart for details).    MDM Rules/Calculators/A&P                         Patient from home with increasing falls and leg pain left greater than right.  Episode of chest pain earlier today as he was trying to change his shirt.  Pain is now resolved. Patient does have intact distal pulses with Doppler.  Reports leg weakness ongoing for several weeks which she attributes to his neuropathy and normally walks with a walker. Labs here show stable kidney function.  EKG is nonischemic.  Troponin negative.  Hemoglobin slightly decreased from baseline but Hemoccult is positive.  Does have a history of hemorrhoids.  Denies any black or bloody stools.  Takes Plavix and aspirin but no other anticoagulation.  Imaging today shows no evidence of acute infarct.  Back MRI does show Chronic changes including L4/5 disc protrusion stable degenerative changes without acute change.  Discussed with patient's Imaging with him. He does not want steroids due to possible side effect of hyperglycemia.  He is able to ambulate with a walker with some difficulty.  Second troponin negative.  Patient still with some weakness and trouble walking.  He is agreeable to evaluation with PT and OT today  Vitals remained stable.  Patient agreeable to observation admission for his exertional chest pain as well as generalized weakness.  His imaging is as above and appears fairly stable.  He declined steroids for his lumbar spine  pathology due to his diabetes and does not want to have risks of hyperglycemia.  Observation admission discussed with Dr. Hal Hope.  PRINCESTON BLIZZARD was evaluated in Emergency Department on 12/19/2019 for the symptoms described in the history of present illness. He was evaluated in the context of the global COVID-19 pandemic, which necessitated consideration that the patient might be at risk for infection with the SARS-CoV-2 virus that causes COVID-19. Institutional protocols and algorithms that pertain to the evaluation of patients at risk for COVID-19 are in a state of rapid change based on information released by regulatory bodies including the CDC and federal and state organizations. These policies and algorithms were followed during the patient's care in the ED.   Final Clinical Impression(s) / ED Diagnoses Final diagnoses:  None    Rx / DC Orders ED Discharge Orders    None       Jaidyn Usery, Annie Main, MD 12/19/19 [phone]

## 2019-12-19 NOTE — Progress Notes (Signed)
°  Echocardiogram 2D Echocardiogram has been performed.  [name] 12/19/2019, 3:45 PM

## 2019-12-19 NOTE — Telephone Encounter (Signed)
Appears went to ER later in day

## 2019-12-19 NOTE — ED Notes (Signed)
Pt transported to MRI 

## 2019-12-19 NOTE — H&P (Addendum)
History and Physical    DOA: 12/18/2019  PCP: [name], MD  Patient coming from: Home  Chief Complaint: Weakness, fall, chest pain  HPI: [name] is a 69 y.o. male with history h/o hypertension, hyperlipidemia chronic back pain, degenerative disc disease with neuropathy/gait imbalance, thoracic aortic aneurysm, CKD stage III, s/p renal transplant, prostate cancer, CAD/CHF presented to the ED after a fall.  Patient states he has chronic back pain and lower extremity weakness for which he follows orthopedics Dr. Lorin Mercy as outpatient and has undergone epidural injections almost yearly with some relief and also followed by neurology Dr. Merry Proud for neuropathy/tremors.  He was seen by neurology recently and gabapentin dosage was tapered down and concern for intermittent myoclonus and amitriptyline dosage was increased to help with pain and tremors.  Over the last 6 weeks patient has had worsening sciatic pain on the left side as well as weakness requiring him to use walker consistently (previously was using walker as needed on bad days).  Yesterday he was trying to reach the lower rack of clothes in his closet when he felt somewhat lightheaded resulting in a fall where he landed on his knees and then to the side.  Denies any loss of consciousness.  When he was trying to get up he felt left-sided chest discomfort as well as neck pain.  He describes chest discomfort as sharp pain lasting 1 to 2 minutes, nonradiating, not associated with nausea/vomiting or palpitations or sweating.  Patient denies any direct neck trauma but feels he might of pulled his neck in the process.  Patient reports undergoing epidural injections through orthopedics previously which helped him for few months.  He is scheduled to follow-up again with Dr. Lorin Mercy this winter.  He feels his symptoms might have gotten worse since gabapentin dosage was reduced.  He has had intolerance to Lyrica with exacerbation of PTSD symptoms per  last neurology note.  Patient does not want to use oral steroids in concern for uncontrolled diabetes. ED work-up: Afebrile, pulse 78, respiratory rate 18, BP 125/68, pulse ox 96% on room air. WBC 3.6, hemoglobin 11.7, platelets 200.  Sodium 139, potassium 3.4, chloride 99, bicarb 29, glucose 131, BUN 21, creatinine 1.63 (baseline), GFR 42, troponin HS 12->14.  EKG- Normal sinus rhythm, incomplete right bundle branch block, QTC 473 ms.    Review of Systems: As per HPI otherwise 10 point review of systems negative.    Past Medical History:  Diagnosis Date  . Allergy   . Angina   . Arthritis   . Backache, unspecified   . CHF (congestive heart failure) ([place])   . Chills   . Chronic kidney disease, stage III (moderate)    HD T- TH-SAT  . Complication of anesthesia    01/2011 could not eat,, hospt x2, was placed on hd and cleared up  . Coronary atherosclerosis of native coronary artery   . Diabetes mellitus 2004  . Dizziness   . Dysphagia, unspecified(787.20)   . Gastroparesis   . Headache(784.0)   . Hemorrhage of rectum and anus   . Hyperlipidemia   . Hypertrophy of prostate with urinary obstruction and other lower urinary tract symptoms (LUTS)   . INTERNAL HEMORRHOIDS 10/16/2008   Qualifier: Diagnosis of  By: Nolon Rod CMA (AAMA), Robin    . Internal hemorrhoids without mention of complication   . Myocardial infarction ([place]) 2002  . Nausea alone   . Obesity   . Other dyspnea and respiratory abnormality   .  Other malaise and fatigue   . Personal history of unspecified circulatory disease   . Posttraumatic stress disorder   . Prostate cancer ([place])   . Sleep apnea    USES CPAP   . Stroke [facility])    2007  . Unspecified essential hypertension    hx htn     Past Surgical History:  Procedure Laterality Date  . ANAL FISSURECTOMY    . AV FISTULA PLACEMENT    . CARDIAC CATHETERIZATION     2005 DR BRODIE  . HEMORRHOID SURGERY    . PROSTATE BIOPSY    . revision of fistula      renal failure  . VENTRAL HERNIA REPAIR  07/01/2011   Procedure: HERNIA REPAIR VENTRAL ADULT;  Surgeon: Joyice Faster. Cornett, MD;  Location: [facility];  Service: General;  Laterality: N/A;    Social history:  reports that he has never smoked. He has never used smokeless tobacco. He reports that he does not drink alcohol and does not use drugs.   No Known Allergies  Family History  Problem Relation Age of Onset  . Heart disease Father   . Renal Disease Father   . Dementia Mother   . Heart attack Mother   . Diabetes Sister   . Heart disease Sister   . Diabetes Maternal Aunt   . Diabetes Maternal Uncle   . Diabetes Paternal Aunt   . Diabetes Paternal Uncle   . Heart disease Other   . Heart attack Brother   . Lung cancer Sister   . Renal Disease Brother   . Ovarian cancer Cousin   . Lung cancer Cousin   . Breast cancer Neg Hx   . Colon cancer Neg Hx   . Pancreatic cancer Neg Hx   . Prostate cancer Neg Hx       Prior to Admission medications   Medication Sig Start Date End Date Taking? Authorizing Provider  acetaminophen (TYLENOL) 500 MG tablet Take 1,000 mg by mouth as needed for mild pain or headache.    Yes [provider]  allopurinol (ZYLOPRIM) 100 MG tablet Take 200 mg by mouth daily.    Yes [provider]  amLODipine (NORVASC) 10 MG tablet Take 10 mg by mouth daily. 08/13/16  Yes [provider]  aspirin 81 MG tablet Take 81 mg by mouth at bedtime.    Yes [provider]  atorvastatin (LIPITOR) 40 MG tablet Take 40 mg by mouth daily.   Yes [provider]  clopidogrel (PLAVIX) 75 MG tablet Take 75 mg by mouth daily.    Yes [provider]  diazepam (VALIUM) 5 MG tablet Take 1 tablet (5 mg total) by mouth at bedtime. Patient taking differently: Take 5 mg by mouth every 8 (eight) hours as needed for muscle spasms.  07/04/15  Yes [name], MD  furosemide (LASIX) 40 MG tablet Take 1 tablet (40 mg total) by mouth  daily. Patient taking differently: Take 40 mg by mouth 2 (two) times daily.  11/23/17  Yes Cherene Altes, MD  gabapentin (NEURONTIN) 300 MG capsule Take 2 capsules in morning, 1 capsule in afternoon, and 2 capsules at night. Patient taking differently: Take 300 mg by mouth See admin instructions. Take two tablets by mouth in the morning, then take 1 tablet by mouth midday, then take 2 tablets by mouth at night per patient 08/09/19  Yes Jaffe, Adam R, DO  glucose blood (ACCU-CHEK AVIVA PLUS) test strip USE TO TEST BLOOD  SUGAR 3 TIMES A DAY 05/09/19  Yes [name], MD  insulin aspart protamine- aspart (NOVOLOG MIX 70/30) (70-30) 100 UNIT/ML injection Inject 75 Units into the skin 2 (two) times daily with a meal.    Yes [provider]  Insulin Syringe-Needle U-100 (B-D INS SYR ULTRAFINE 1CC/31G) 31G X 5/16" 1 ML MISC Used to give insulin injections twice daily. 09/29/17  Yes Renato Shin, MD  Magnesium 200 MG TABS Take 200 mg by mouth every evening.    Yes [provider]  mycophenolate (MYFORTIC) 180 MG EC tablet Take 180 mg by mouth in the morning, at noon, and at bedtime.    Yes [provider]  nitroGLYCERIN (NITROSTAT) 0.4 MG SL tablet PLACE 1 TABLET (0.4 MG TOTAL) UNDER THE TONGUE EVERY 5 (FIVE) MINUTES AS NEEDED FOR CHEST PAIN. 01/25/18  Yes Skeet Latch, MD  nortriptyline (PAMELOR) 50 MG capsule Take 2 capsules (100 mg total) by mouth at bedtime. 12/08/19  Yes Tomi Likens, Adam R, DO  omeprazole (PRILOSEC) 20 MG capsule Take 1 capsule (20 mg total) by mouth 2 (two) times daily before a meal. 03/01/19  Yes [name], MD  Semaglutide,0.25 or 0.5MG /DOS, (OZEMPIC, 0.25 OR 0.5 MG/DOSE,) 2 MG/1.5ML SOPN Inject 1 mg into the skin once a week.    Yes [provider]  tacrolimus (PROGRAF) 5 MG capsule Take 5 mg by mouth 2 (two) times daily.  11/26/17  Yes [provider]  Vitamin D, Ergocalciferol, (DRISDOL) 1.25 MG (50000 UNIT) CAPS capsule Take  50,000 Units by mouth 2 (two) times a week. 04/26/19  Yes [provider]    Physical Exam: Vitals:   12/19/19 0500 12/19/19 0530 12/19/19 0615 12/19/19 0703  BP: (!) 132/88 (!) 135/88 (!) 146/87 104/70  Pulse: 91 90 91 92  Resp: 14 14 13 13   Temp:      TempSrc:      SpO2: 92% 93% 95% 92%    Constitutional: NAD, calm, comfortable Eyes: PERRL, lids and conjunctivae normal ENMT: Mucous membranes are moist. Posterior pharynx clear of any exudate or lesions.Normal dentition.  Neck: Mild reduced range of motion on flexion due to chronic neck pain Respiratory: clear to auscultation bilaterally, no wheezing, no crackles. Normal respiratory effort. No accessory muscle use.  Cardiovascular: Regular rate and rhythm, no murmurs / rubs / gallops.  1+ lower extremity edema. 2+ pedal pulses. No carotid bruits.  Abdomen: no tenderness, no masses palpated. No hepatosplenomegaly. Bowel sounds positive.  Musculoskeletal: Decreased range of motion along left hip/knee joints due to pain. Neurologic: CN 2-12 grossly intact. Sensation to crude touch reduced in lower thirds of lower extremities/feet, strength 3/5 in lower extremities and 4/5 in upper extremities. Psychiatric: Normal judgment and insight. Alert and oriented x 3. Normal mood.  SKIN/catheters: Venous stasis edema/stasis dermatitis noted in lower extremities Labs on Admission: I have personally reviewed following labs and imaging studies  CBC: Recent Labs  Lab 12/18/19 1325  WBC 3.6*  HGB 11.7*  HCT 36.8*  MCV 87.8  PLT 709   Basic Metabolic Panel: Recent Labs  Lab 12/18/19 1325  NA 139  K 3.4*  CL 99  CO2 29  GLUCOSE 131*  BUN 21  CREATININE 1.63*  CALCIUM 9.4   GFR: Estimated Creatinine Clearance: 52.5 mL/min (A) (by C-G formula based on SCr of 1.63 mg/dL (H)). Recent Labs  Lab 12/18/19 1325  WBC 3.6*   Liver Function Tests: No results for input(s): AST, ALT, ALKPHOS, BILITOT, PROT, ALBUMIN in  the last 168  hours. No results for input(s): LIPASE, AMYLASE in the last 168 hours. No results for input(s): AMMONIA in the last 168 hours. Coagulation Profile: No results for input(s): INR, PROTIME in the last 168 hours. Cardiac Enzymes: No results for input(s): CKTOTAL, CKMB, CKMBINDEX, TROPONINI in the last 168 hours. BNP (last 3 results) No results for input(s): PROBNP in the last 8760 hours. HbA1C: No results for input(s): HGBA1C in the last 72 hours. CBG: No results for input(s): GLUCAP in the last 168 hours. Lipid Profile: No results for input(s): CHOL, HDL, LDLCALC, TRIG, CHOLHDL, LDLDIRECT in the last 72 hours. Thyroid Function Tests: No results for input(s): TSH, T4TOTAL, FREET4, T3FREE, THYROIDAB in the last 72 hours. Anemia Panel: No results for input(s): VITAMINB12, FOLATE, FERRITIN, TIBC, IRON, RETICCTPCT in the last 72 hours. Urine analysis:    Component Value Date/Time   COLORURINE YELLOW 12/18/2019 2143   APPEARANCEUR CLEAR 12/18/2019 2143   LABSPEC 1.012 12/18/2019 2143   PHURINE 5.0 12/18/2019 2143   GLUCOSEU NEGATIVE 12/18/2019 2143   HGBUR NEGATIVE 12/18/2019 2143   BILIRUBINUR NEGATIVE 12/18/2019 2143   KETONESUR NEGATIVE 12/18/2019 2143   PROTEINUR NEGATIVE 12/18/2019 2143   UROBILINOGEN 0.2 01/15/2015 1642   NITRITE NEGATIVE 12/18/2019 2143   LEUKOCYTESUR NEGATIVE 12/18/2019 2143    Radiological Exams on Admission: Personally reviewed  DG Chest 2 View  Result Date: 12/19/2019 CLINICAL DATA:  Recent fall with chest pain, initial encounter EXAM: CHEST - 2 VIEW COMPARISON:  11/13/2019 FINDINGS: Cardiac shadow is at the upper limits of normal in size. The lungs are well aerated bilaterally. No focal infiltrate or effusion is seen. No acute bony abnormality is noted. IMPRESSION: No acute abnormality noted. Electronically Signed   By: Inez Catalina M.D.   On: 12/19/2019 01:35   MR BRAIN WO CONTRAST  Result Date: 12/19/2019 CLINICAL DATA:  Initial evaluation for acute  dizziness, weakness. EXAM: MRI HEAD WITHOUT CONTRAST TECHNIQUE: Multiplanar, multiecho pulse sequences of the brain and surrounding structures were obtained without intravenous contrast. COMPARISON:  Comparison made with prior MRI from 02/01/2015. FINDINGS: Brain: Generalized age-related cerebral atrophy. Mild patchy T2/FLAIR hyperintensity seen within the periventricular white matter most consistent with chronic small vessel ischemic disease, mild for age. Few small remote left cerebellar infarcts noted, left PICA territory. No abnormal foci of restricted diffusion to suggest acute or subacute ischemia. Gray-white matter differentiation maintained. No encephalomalacia to suggest chronic cortical infarction elsewhere within the brain. No susceptibility artifact to suggest acute or chronic intracranial hemorrhage. No mass lesion, midline shift or mass effect. No hydrocephalus or extra-axial fluid collection. Pituitary gland suprasellar region normal. Midline structures intact. Vascular: Major intracranial vascular flow voids are maintained. Skull and upper cervical spine: Craniocervical junction within normal limits. Bone marrow signal intensity diffusely heterogeneous but overall within normal limits. Few scattered benign hemangiomata noted. No worrisome osseous lesions. No scalp soft tissue abnormality. Sinuses/Orbits: Globes and orbital soft tissues within normal limits. Retention cyst noted within the left maxillary sinus. Moderate mucosal thickening noted within the right maxillary sinus. Paranasal sinuses are otherwise clear. No mastoid effusion. Inner ear structures grossly normal. Other: None. IMPRESSION: 1. No acute intracranial abnormality. 2. Mild age-related cerebral atrophy with chronic small vessel ischemic disease, with a few small remote left cerebellar infarcts, left PICA territory. Electronically Signed   By: Jeannine Boga M.D.   On: 12/19/2019 04:13   MR LUMBAR SPINE WO CONTRAST  Result  Date: 12/19/2019 CLINICAL DATA:  Initial evaluation for low back pain  with left lower extremity weakness. Increased falls. EXAM: MRI LUMBAR SPINE WITHOUT CONTRAST TECHNIQUE: Multiplanar, multisequence MR imaging of the lumbar spine was performed. No intravenous contrast was administered. COMPARISON:  Prior MRI from 10/07/2018. FINDINGS: Segmentation: Standard. Lowest well-formed disc space labeled the L5-S1 level. Alignment: 4 mm retrolisthesis of L5 on S1, stable. Associated trace anterolisthesis of L4 on L5, also unchanged. Alignment otherwise normal with preservation of the normal lumbar lordosis. Vertebrae: Vertebral body height maintained without evidence for acute or chronic fracture. Prominent chronic endplate Schmorl's node deformity noted at the superior endplate of L3, stable. Post radiation changes seen within the visualized bone marrow extending from the L5 level inferiorly into the sacrum. Elsewhere, visualized bone marrow signal intensity is mildly heterogeneous but overall within normal limits. Few scattered benign hemangiomata noted. No worrisome osseous lesions. No abnormal marrow edema. Conus medullaris and cauda equina: Conus extends to the L1 level. Conus and cauda equina appear normal. Paraspinal and other soft tissues: Paraspinous soft tissues demonstrate no acute finding. Bilateral renal atrophy with multiple scattered cysts noted bilaterally, largest of which measures 2 cm at the interpolar right kidney. A few of these are mildly complex in appearance, likely reflecting proteinaceous and/or hemorrhagic cyst as seen on prior abdominal MRI. Prominent distension of the partially visualized urinary bladder. Disc levels: L1-2: Negative interspace. Mild facet hypertrophy. No canal or foraminal stenosis. L2-3: Mild diffuse disc bulge with disc desiccation. Disc bulging asymmetric to the left. Prominent endplate Schmorl's node deformity noted at the superior endplate of L3. Mild to moderate facet and  ligament flavum hypertrophy. No significant spinal stenosis. Mild left L2 foraminal narrowing, unchanged. No significant right foraminal encroachment. L3-4: Disc desiccation with minimal disc bulge, slightly asymmetric to the left. Mild to moderate facet and ligament flavum hypertrophy. No significant spinal stenosis. Mild left L3 foraminal narrowing, stable. No significant right foraminal encroachment. L4-5: Mild diffuse disc bulge with disc desiccation. Superimposed small left foraminal disc protrusion is seen, slightly more prominent and focal in nature as compared to previous (series 8, image 27). This closely approximates the exiting left L4 nerve root. Moderate facet and ligament flavum hypertrophy with associated trace joint effusions. Resultant mild spinal stenosis, relatively unchanged. Mild left greater than right L4 foraminal narrowing, similar. L5-S1: Trace retrolisthesis. Diffuse disc bulge with disc desiccation and intervertebral disc space narrowing. Mild reactive endplate spurring. Moderate bilateral facet hypertrophy. Resultant mild to moderate left greater than right lateral recess stenosis, descending S1 nerve root level. Moderate bilateral L5 foraminal narrowing, unchanged. IMPRESSION: 1. No acute abnormality within the lumbar spine. 2. Small left foraminal disc protrusion at L4-5, closely approximating and potentially irritating the exiting left L4 nerve root. This is slightly increased in size/prominence as compared to previous. 3. Multifactorial degenerative changes at L5-S1, resulting in mild to moderate left greater than right lateral recess stenosis, with moderate bilateral L5 foraminal narrowing. Overall appearance is stable from previous. 4. Mild left eccentric disc bulge and facet hypertrophy at L2-3 and L3-4 with resultant mild left L2 and L3 foraminal stenosis, similar to prior. Electronically Signed   By: Jeannine Boga M.D.   On: 12/19/2019 04:01   DG Knee Complete 4 Views  Left  Result Date: 12/19/2019 CLINICAL DATA:  Recent fall with left knee pain, initial encounter EXAM: LEFT KNEE - COMPLETE 4+ VIEW COMPARISON:  None. FINDINGS: Mild medial joint space narrowing is noted. No acute fracture or dislocation is seen. No soft tissue abnormality is noted. IMPRESSION: Mild degenerative change without acute abnormality.  Electronically Signed   By: Inez Catalina M.D.   On: 12/19/2019 01:36   DG Knee Complete 4 Views Right  Result Date: 12/19/2019 CLINICAL DATA:  Recent fall with right knee pain, initial encounter EXAM: RIGHT KNEE - COMPLETE 4+ VIEW COMPARISON:  None. FINDINGS: Mild medial joint space narrowing is noted. No acute fracture or dislocation is seen. No soft tissue abnormality is noted. IMPRESSION: Mild medial joint space narrowing.  No acute abnormality noted. Electronically Signed   By: Inez Catalina M.D.   On: 12/19/2019 01:34    EKG: Independently reviewed.  Normal sinus rhythm, incomplete right bundle branch block, QTC 473 ms     Assessment and Plan:   Principal Problem:   Chest pain Active Problems:   Fall at home   Chronic arthropathy   CAD -s/p DES to marginal vessel at 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  Atypical chest pain/dizziness: Likely musculoskeletal after fall/muscle pull.  EKG unremarkable.  Currently not reproducible.  Resume home muscle relaxants.  2 sets of troponin appear to be flat.  Will follow-up third set.  Check echo.  Can consult cardiology if echo shows new wall motion abnormalities. Patient has history of CAD s/p stenting in 2017 at [facility] along with thoracic  aortic aneurysm.  He was admitted to this [facility] in July 2019 for angina evaluation, seen by [name] cardiology and stress test at that time did show inducible ischemia in inferolateral wall.  Option of cardiac cath versus medical treatment offered by cardiology, patient did not wish to undergo cardiac cath and was discharged with medical optimization.  Nitro as needed sublingual available.  If echo similar to prior, can likely follow-up cardiology as outpatient.  Given report of dizziness, will check orthostatics.  Patient also on Valium which could be contributing.  MRI head unremarkable  2.  Lumbar radiculopathy/lower extremity weakness/falls: Seen by Dr. Lorin Mercy in May 2020 and recommended physical therapy evaluation and possibly reconsideration of epidural if symptoms worsen.  Patient has also been followed by neurology with recent adjustment in neuropathic agents as described above.  Will resume Neurontin at 300 mg 3 times daily and Elavil per neurology recommendations last week.  Will request PT/OT evaluation.  Since patient reluctant regarding p.o. steroid use, will consult orthopedics for further recommendations/epidural.  Lidocaine patch ordered.  Patient also on Valium for muscle spasms.  MRI lumbar spine done today appears to 6 show severe degenerative disc disease/foraminal stenosis similar to prior imaging studies with no acute changes.  3.  Hypertension: Given complaints of feeling dizzy, will hold Norvasc and check orthostatics.  Also noted to have chronic venous edema in lower extremities, would avoid vasodilators like Norvasc/nitrates.  Resume other medications, hydralazine as needed available.    4. Prostrate cancer : Stage T1c adenocarcinoma of the prostate with Gleason score of 4+3, and PSA of 25.6.  Followed by urology and radiation oncology as outpatient.  5.  CKD stage IIIa, s/p renal transplant: Appears to be at baseline with creatinine around 1.6.  Avoid nephrotoxic agents.   Resume chronic immunosuppressants.  6.  Diabetes mellitus: Resume 70/30 insulin  at 50 units twice daily (home dose per patient report).  Patient also on weekly shots of Semaglutide (q. Monday).  Diabetic diet.  Sliding scale insulin.  7.  Leukopenia/anemia: Could be related to malignancy/radiation treatments.  Leukopenia appears to be chronic and stable.  Baseline hemoglobin appears to be around 13.  Currently down to 11.7.  No signs of melena or hematochezia.  Check iron studies.  8.  Gout: Resume home medications  9.  Hyperlipidemia: Resume home medications   10.  PTSD: Resume Elavil.    11.  History of CVA: Resume aspirin Plavix and statins.    12.  Sleep apnea resume CPAP  13. GERD: PPI  DVT prophylaxis: Lovenox  COVID screen: Negative  Code Status: Full code   .Health care proxy would be his son  Patient/Family Communication: Discussed with patient and all questions answered to satisfaction.  Consults called: Orthopedics Admission status :Patient will be admitted under OBSERVATION status.The patient's presenting symptoms, physical exam findings, and initial radiographic and laboratory data in the context of their medical condition is felt to place them at low risk for further clinical deterioration. Furthermore, it is anticipated that the patient will be medically stable for discharge from the hospital within 2 midnights of hospital stay.      Guilford Shi MD [facility] Pager in [place]  If 7PM-7AM, please contact night-coverage [email or web address]   12/19/2019, 9:23 AM

## 2019-12-19 NOTE — ED Notes (Signed)
Pt refused Insulin due to not eating, states "I have to eat a meal before I can take 50 units of my insulin"

## 2019-12-19 NOTE — ED Notes (Signed)
Pt has AV fistula to left arm, restriction band in place at this time.

## 2019-12-19 NOTE — Evaluation (Signed)
Physical Therapy Evaluation Patient Details Name: [name] MRN: [id number] DOB: [birth year] Today's Date: 12/19/2019   History of Present Illness  Pt is 69 yo male with history of HTN, HLD, chronic back pain, DDD with neuropathy/gait imbalance, CKD, renal transplant, prostate CA, CAD, and CHF.  Pt presented to ED s/p fall. After fall pt with chest discomfort that was non radiating - felt to be musculoskeletal per Hand P.  He has hx of chronic back pain with lower extremity weakness for which he gets epidural injections nearly yearly; also followed by neurology for neuropathy/tremors. He was recently seen by neurology with changes in gabapentin.  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  MRI lumbar spine showed severe degenerative disc disease/foraminal stenosis similar to prior imaging studies with no acute changes.  Clinical Impression   Pt admitted with above diagnosis. During PT evaluation, pt presenting with decreased balance, endurance, mobility, strength ( L LE weaker than R LE baseline), and safety.  He denied dizziness but reports decreased balance.  Pt was very lethargic at time of eval (reports no sleep last night).  Did note pt with MRI that revealed remote cerebellar infarct, hx of LE neuropathy and tremors, lower BP, and hx of LE weakness that could be contributing to decreased strength, balance, and feelings of unsteadiness.  May additionally benefit from further vestibular rule out - limited at eval due to on ED stretcher and lethargic.  Pt currently with functional limitations due to the deficits listed below (see PT Problem List). Pt will benefit from skilled PT to increase their independence and safety with mobility to allow discharge to the venue listed below.       Follow Up Recommendations Home health PT;Supervision for mobility/OOB    Equipment Recommendations  None recommended by PT    Recommendations for  Other Services       Precautions / Restrictions Precautions Precautions: Fall      Mobility  Bed Mobility Overal bed mobility: Needs Assistance Bed Mobility: Supine to Sit;Sit to Supine     Supine to sit: Min assist Sit to supine: Min assist      Transfers Overall transfer level: Needs assistance Equipment used: Rolling walker (2 wheeled) Transfers: Sit to/from Stand Sit to Stand: Min guard         General transfer comment: min guard for safety  Ambulation/Gait Ambulation/Gait assistance: Herbalist (Feet): 60 Feet Assistive device: Rolling walker (2 wheeled) Gait Pattern/deviations: Shuffle;Decreased dorsiflexion - left;Trunk flexed Gait velocity: decreased   General Gait Details: mild unsteadiness requiring min guard for steadying and min A for RW at times (pt use to rollator); noted decreased foot clearance on L side - pt reports this is baseline as he hears his left foot shuffling when walking  Stairs            Wheelchair Mobility    Modified Rankin (Stroke Patients Only)       Balance Overall balance assessment: Needs assistance Sitting-balance support: No upper extremity supported;Feet supported Sitting balance-Leahy Scale: Fair     Standing balance support: No upper extremity supported Standing balance-Leahy Scale: Fair Standing balance comment: able to take hands off walker but demonstrated swaying motion requiring min guard for safety                             Pertinent Vitals/Pain Pain Assessment: Faces Faces Pain  Scale: Hurts a little bit Pain Location: back -  chronic; denied chest pain Pain Descriptors / Indicators: Discomfort Pain Intervention(s): Limited activity within patient's tolerance;Monitored during session;Repositioned    Home Living Family/patient expects to be discharged to:: Private residence Living Arrangements: Children Available Help at Discharge: Family;Available 24 hours/day (daughter  works from home) Type of Home: House Home Access: Stairs to enter Entrance Stairs-Rails: None Entrance Stairs-Number of Steps: 1 step to enter back door Home Layout: Two level;Able to live on main level with bedroom/bathroom Home Equipment: Grab bars - toilet;Grab bars - tub/shower;Shower seat;Cane - single point;Walker - 4 wheels      Prior Function Level of Independence: Needs assistance   Gait / Transfers Assistance Needed: able to ambulate in house with rollator; has had 3 falls in the past week  ADL's / Homemaking Assistance Needed: Doing ADLs but difficult; had assist with IADLs  Comments: daughter reports pt gradually getting weaker and unable to do IADLs     Hand Dominance        Extremity/Trunk Assessment   Upper Extremity Assessment Upper Extremity Assessment: Overall WFL for tasks assessed    Lower Extremity Assessment Lower Extremity Assessment: LLE deficits/detail;RLE deficits/detail RLE Deficits / Details: ROM WFL ; MMT 5/5 RLE Sensation: decreased light touch LLE Deficits / Details: ROM WFL; MMT 4/5 throughout (baseline weaker) LLE Sensation: decreased light touch    Cervical / Trunk Assessment Cervical / Trunk Assessment: Normal  Communication   Communication: No difficulties  Cognition Arousal/Alertness: Lethargic Behavior During Therapy: WFL for tasks assessed/performed Overall Cognitive Status: Within Functional Limits for tasks assessed                                        General Comments  EOEM/smooth pursuit: no nystagmus noted, however, did have some difficulty with smooth pursuit and reports some dizziness Gaze stabilization : intact, no dizziness Pt denies dizziness with head turns, rolling, or sit to supine.  Unable to test further vestibular due to difficult positioning on ED stretcher.   BP as follows: supine108/67 sit102/76 Stand 99/64 Sitting immediate post walk 114/74 HR and O2 stable    Exercises      Assessment/Plan    PT Assessment Patient needs continued PT services  PT Problem List Decreased strength;Decreased mobility;Decreased safety awareness;Decreased range of motion;Decreased coordination;Decreased activity tolerance;Decreased balance;Decreased knowledge of use of DME;Decreased knowledge of precautions;Impaired sensation;Pain       PT Treatment Interventions DME instruction;Therapeutic activities;Gait training;Therapeutic exercise;Patient/family education;Balance training;Functional mobility training;Neuromuscular re-education;Stair training    PT Goals (Current goals can be found in the Care Plan section)  Acute Rehab PT Goals Patient Stated Goal: return home with daughter PT Goal Formulation: With patient/family Time For Goal Achievement: 01/02/20 Potential to Achieve Goals: Good    Frequency Min 3X/week   Barriers to discharge        Co-evaluation               AM-PAC PT "6 Clicks" Mobility  Outcome Measure Help needed turning from your back to your side while in a flat bed without using bedrails?: A Little Help needed moving from lying on your back to sitting on the side of a flat bed without using bedrails?: A Little Help needed moving to and from a bed to a chair (including a wheelchair)?: A Little Help needed standing up from a chair using your arms (e.g., wheelchair or  bedside chair)?: A Little Help needed to walk in hospital room?: A Little Help needed climbing 3-5 steps with a railing? : A Lot 6 Click Score: 17    End of Session Equipment Utilized During Treatment: Gait belt Activity Tolerance: Patient tolerated treatment well;Other (comment) (limitations with vestibular testing on ED stretcher) Patient left: in bed;with call bell/phone within reach;with family/visitor present Nurse Communication: Mobility status PT Visit Diagnosis: Unsteadiness on feet (R26.81);History of falling (Z91.81);Muscle weakness (generalized) (M62.81)    Time:  1200-1229 PT Time Calculation (min) (ACUTE ONLY): 29 min   Charges:   PT Evaluation $PT Eval Moderate Complexity: 1 Mod PT Treatments $Gait Training: 8-22 mins        Abran Richard, PT Acute Rehab Services Pager [phone] [facility] [place] 12/19/2019, 12:50 PM

## 2019-12-19 NOTE — ED Notes (Signed)
Pt daughter contact [place]

## 2019-12-20 ENCOUNTER — Encounter (HOSPITAL_COMMUNITY): Payer: Self-pay | Admitting: Internal Medicine

## 2019-12-20 DIAGNOSIS — I2510 Atherosclerotic heart disease of native coronary artery without angina pectoris: Secondary | ICD-10-CM | POA: Diagnosis present

## 2019-12-20 DIAGNOSIS — R262 Difficulty in walking, not elsewhere classified: Secondary | ICD-10-CM | POA: Diagnosis present

## 2019-12-20 DIAGNOSIS — R251 Tremor, unspecified: Secondary | ICD-10-CM | POA: Diagnosis present

## 2019-12-20 DIAGNOSIS — I4510 Unspecified right bundle-branch block: Secondary | ICD-10-CM | POA: Diagnosis present

## 2019-12-20 DIAGNOSIS — N400 Enlarged prostate without lower urinary tract symptoms: Secondary | ICD-10-CM | POA: Diagnosis present

## 2019-12-20 DIAGNOSIS — I5032 Chronic diastolic (congestive) heart failure: Secondary | ICD-10-CM | POA: Diagnosis present

## 2019-12-20 DIAGNOSIS — M109 Gout, unspecified: Secondary | ICD-10-CM | POA: Diagnosis present

## 2019-12-20 DIAGNOSIS — K219 Gastro-esophageal reflux disease without esophagitis: Secondary | ICD-10-CM | POA: Diagnosis present

## 2019-12-20 DIAGNOSIS — I10 Essential (primary) hypertension: Secondary | ICD-10-CM | POA: Diagnosis not present

## 2019-12-20 DIAGNOSIS — G4733 Obstructive sleep apnea (adult) (pediatric): Secondary | ICD-10-CM | POA: Diagnosis not present

## 2019-12-20 DIAGNOSIS — I77810 Thoracic aortic ectasia: Secondary | ICD-10-CM | POA: Diagnosis present

## 2019-12-20 DIAGNOSIS — E1142 Type 2 diabetes mellitus with diabetic polyneuropathy: Secondary | ICD-10-CM | POA: Diagnosis not present

## 2019-12-20 DIAGNOSIS — R296 Repeated falls: Secondary | ICD-10-CM | POA: Diagnosis present

## 2019-12-20 DIAGNOSIS — Z20822 Contact with and (suspected) exposure to covid-19: Secondary | ICD-10-CM | POA: Diagnosis present

## 2019-12-20 DIAGNOSIS — E785 Hyperlipidemia, unspecified: Secondary | ICD-10-CM | POA: Diagnosis present

## 2019-12-20 DIAGNOSIS — R072 Precordial pain: Secondary | ICD-10-CM | POA: Diagnosis not present

## 2019-12-20 DIAGNOSIS — M5030 Other cervical disc degeneration, unspecified cervical region: Secondary | ICD-10-CM | POA: Diagnosis present

## 2019-12-20 DIAGNOSIS — Y92009 Unspecified place in unspecified non-institutional (private) residence as the place of occurrence of the external cause: Secondary | ICD-10-CM | POA: Diagnosis not present

## 2019-12-20 DIAGNOSIS — N1831 Chronic kidney disease, stage 3a: Secondary | ICD-10-CM | POA: Diagnosis present

## 2019-12-20 DIAGNOSIS — R0789 Other chest pain: Secondary | ICD-10-CM | POA: Diagnosis present

## 2019-12-20 DIAGNOSIS — I951 Orthostatic hypotension: Secondary | ICD-10-CM | POA: Diagnosis not present

## 2019-12-20 DIAGNOSIS — R531 Weakness: Secondary | ICD-10-CM | POA: Diagnosis present

## 2019-12-20 DIAGNOSIS — M25561 Pain in right knee: Secondary | ICD-10-CM | POA: Diagnosis present

## 2019-12-20 DIAGNOSIS — F4310 Post-traumatic stress disorder, unspecified: Secondary | ICD-10-CM | POA: Diagnosis present

## 2019-12-20 DIAGNOSIS — M5126 Other intervertebral disc displacement, lumbar region: Secondary | ICD-10-CM

## 2019-12-20 DIAGNOSIS — M48061 Spinal stenosis, lumbar region without neurogenic claudication: Secondary | ICD-10-CM | POA: Diagnosis present

## 2019-12-20 DIAGNOSIS — Z940 Kidney transplant status: Secondary | ICD-10-CM | POA: Diagnosis not present

## 2019-12-20 DIAGNOSIS — M4802 Spinal stenosis, cervical region: Secondary | ICD-10-CM | POA: Diagnosis present

## 2019-12-20 DIAGNOSIS — C61 Malignant neoplasm of prostate: Secondary | ICD-10-CM | POA: Diagnosis present

## 2019-12-20 DIAGNOSIS — W19XXXA Unspecified fall, initial encounter: Secondary | ICD-10-CM | POA: Diagnosis present

## 2019-12-20 DIAGNOSIS — I130 Hypertensive heart and chronic kidney disease with heart failure and stage 1 through stage 4 chronic kidney disease, or unspecified chronic kidney disease: Secondary | ICD-10-CM | POA: Diagnosis present

## 2019-12-20 LAB — GLUCOSE, CAPILLARY
Glucose-Capillary: 71 mg/dL (ref 70–99)
Glucose-Capillary: 123 mg/dL — ABNORMAL HIGH (ref 70–99)
Glucose-Capillary: 151 mg/dL — ABNORMAL HIGH (ref 70–99)
Glucose-Capillary: 184 mg/dL — ABNORMAL HIGH (ref 70–99)

## 2019-12-20 LAB — CBC
HCT: 37.3 % — ABNORMAL LOW (ref 39.0–52.0)
Hemoglobin: 11.9 g/dL — ABNORMAL LOW (ref 13.0–17.0)
MCH: 27.5 pg (ref 26.0–34.0)
MCHC: 31.9 g/dL (ref 30.0–36.0)
MCV: 86.1 fL (ref 80.0–100.0)
Platelets: 197 10*3/uL (ref 150–400)
RBC: 4.33 MIL/uL (ref 4.22–5.81)
RDW: 13.8 % (ref 11.5–15.5)
WBC: 2.8 10*3/uL — ABNORMAL LOW (ref 4.0–10.5)
nRBC: 0 % (ref 0.0–0.2)

## 2019-12-20 LAB — BASIC METABOLIC PANEL
Anion gap: 12 (ref 5–15)
BUN: 17 mg/dL (ref 8–23)
CO2: 28 mmol/L (ref 22–32)
Calcium: 9.4 mg/dL (ref 8.9–10.3)
Chloride: 101 mmol/L (ref 98–111)
Creatinine, Ser: 1.43 mg/dL — ABNORMAL HIGH (ref 0.61–1.24)
GFR calc Af Amer: 57 mL/min — ABNORMAL LOW (ref >60)
GFR calc non Af Amer: 49 mL/min — ABNORMAL LOW (ref >60)
Glucose, Bld: 109 mg/dL — ABNORMAL HIGH (ref 70–99)
Potassium: 3.3 mmol/L — ABNORMAL LOW (ref 3.5–5.1)
Sodium: 141 mmol/L (ref 135–145)

## 2019-12-20 MED ORDER — MYCOPHENOLATE SODIUM 180 MG PO TBEC
360.0000 mg | DELAYED_RELEASE_TABLET | Freq: Every day | ORAL | Status: DC
  Administered 2019-12-20 – 2019-12-22 (×3): 360 mg via ORAL
  Filled 2019-12-20 (×4): qty 2

## 2019-12-20 MED ORDER — POTASSIUM CHLORIDE CRYS ER 20 MEQ PO TBCR
40.0000 meq | EXTENDED_RELEASE_TABLET | Freq: Once | ORAL | Status: AC
  Administered 2019-12-20: 40 meq via ORAL
  Filled 2019-12-20: qty 2

## 2019-12-20 MED ORDER — FUROSEMIDE 40 MG PO TABS
40.0000 mg | ORAL_TABLET | Freq: Two times a day (BID) | ORAL | Status: DC
  Administered 2019-12-21: 40 mg via ORAL
  Filled 2019-12-20: qty 1

## 2019-12-20 MED ORDER — DICLOFENAC SODIUM 1 % EX GEL
2.0000 g | Freq: Four times a day (QID) | CUTANEOUS | Status: DC
  Administered 2019-12-20 – 2019-12-22 (×5): 2 g via TOPICAL
  Filled 2019-12-20: qty 100

## 2019-12-20 MED ORDER — SODIUM CHLORIDE 0.9 % IV SOLN: INTRAVENOUS | Status: AC

## 2019-12-20 MED ORDER — MYCOPHENOLATE SODIUM 180 MG PO TBEC
180.0000 mg | DELAYED_RELEASE_TABLET | Freq: Every day | ORAL | Status: DC
  Administered 2019-12-20 – 2019-12-21 (×2): 180 mg via ORAL
  Filled 2019-12-20 (×3): qty 1

## 2019-12-20 NOTE — Progress Notes (Signed)
Physical Therapy Treatment Patient Details Name: [name] MRN: [id number] DOB: 18-Oct-1950 Today's Date: 12/20/2019    History of Present Illness Pt is 69 yo male with history of HTN, HLD, chronic back pain, DDD with neuropathy/gait imbalance, CKD, renal transplant, prostate CA, CAD, and CHF.  Pt presented to ED s/p fall. After fall pt with chest discomfort that was non radiating - felt to be musculoskeletal per Hand P.  He has hx of chronic back pain with lower extremity weakness for which he gets epidural injections nearly yearly; also followed by neurology for neuropathy/tremors. He was recently seen by neurology with changes in gabapentin.  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  MRI lumbar spine showed severe degenerative disc disease/foraminal stenosis similar to prior imaging studies with no acute changes.    PT Comments    Patient progressing with ambulation this session able to ambulate community distance with rollator and, though L foot dragging at times due to fatigue noticed it when it did and working to stay safe despite.  Reports was doing PT up until had to do radiation for prostate cancer.  Feel return to outpatient PT for gait/balance and LE strengthening for fall prevention would benefit patient.  Will follow acutely.    Follow Up Recommendations  Supervision for mobility/OOB;Outpatient PT     Equipment Recommendations  None recommended by PT    Recommendations for Other Services       Precautions / Restrictions Precautions Precautions: Fall    Mobility  Bed Mobility               General bed mobility comments: up sitting on EOB  Transfers Overall transfer level: Needs assistance   Transfers: Sit to/from Stand Sit to Stand: Supervision;From elevated surface         General transfer comment: assist for lines, elevated height of bed due to knee soreness from the  fall  Ambulation/Gait Ambulation/Gait assistance: Supervision;Min guard Gait Distance (Feet): 400 Feet Assistive device: 4-wheeled walker Gait Pattern/deviations: Step-through pattern;Decreased stride length;Decreased dorsiflexion - left     General Gait Details: decreased foot clearance on L especially with fatigue   Stairs             Wheelchair Mobility    Modified Rankin (Stroke Patients Only)       Balance Overall balance assessment: Needs assistance   Sitting balance-Leahy Scale: Good       Standing balance-Leahy Scale: Fair Standing balance comment: able to stand without UE support                            Cognition Arousal/Alertness: Awake/alert Behavior During Therapy: WFL for tasks assessed/performed Overall Cognitive Status: Within Functional Limits for tasks assessed                                        Exercises      General Comments General comments (skin integrity, edema, etc.): VSS with activity; discussed footwear and importance of arch supports due to neuropathy      Pertinent Vitals/Pain Pain Assessment: 0-10 Pain Score: 6  Pain Location: back; with ambulation pain in feet Pain Descriptors / Indicators: Aching Pain Intervention(s): Monitored during session;Repositioned    Home Living  Prior Function            PT Goals (current goals can now be found in the care plan section) Progress towards PT goals: Progressing toward goals    Frequency    Min 3X/week      PT Plan Discharge plan needs to be updated    Co-evaluation              AM-PAC PT "6 Clicks" Mobility   Outcome Measure  Help needed turning from your back to your side while in a flat bed without using bedrails?: None Help needed moving from lying on your back to sitting on the side of a flat bed without using bedrails?: None Help needed moving to and from a bed to a chair (including a  wheelchair)?: None Help needed standing up from a chair using your arms (e.g., wheelchair or bedside chair)?: None Help needed to walk in hospital room?: None Help needed climbing 3-5 steps with a railing? : A Little 6 Click Score: 23    End of Session   Activity Tolerance: Patient tolerated treatment well Patient left: in bed;with call bell/phone within reach (seated EOB)   PT Visit Diagnosis: Unsteadiness on feet (R26.81);History of falling (Z91.81);Muscle weakness (generalized) (M62.81)     Time: 6067-7034 PT Time Calculation (min) (ACUTE ONLY): 22 min  Charges:  $Gait Training: 8-22 mins                     Magda Kiel, PT Acute Rehabilitation Services Pager:[phone] Office:[phone] 12/20/2019    Reginia Naas 12/20/2019, 5:12 PM

## 2019-12-20 NOTE — Plan of Care (Signed)

## 2019-12-20 NOTE — Progress Notes (Signed)
Patient said he is missing his glucose applicator. He says it was placed on a bedside table in the ER and is white, gray in color. Patient does have his glucose sensor and glucose probe with his belongings at bedside. Night nurse secretary did call the ER and asked about it but no one knew about them. Will pass it on to the day shift nurse.

## 2019-12-20 NOTE — Consult Note (Signed)
Reason for Consult:LE weakness , falling Referring Physician: Eliseo Squires MD  [name] is an 69 y.o. male.  HPI: 69 year old male last seen by me 10/14/2018 with lumbar disc degeneration.  Patient has diabetes with polyneuropathy.  Coronary artery disease hypertension sleep apnea PTSD.  Past history of left foot drop renal transplant shunt left upper arm.  Patient has had problems with back pain primarily left side greater than right side.  MRI scan was done on admission.  Patient had some problems with chest pain on admission.  Chest pain was left-sided with discomfort some pain radiated into his neck.  Patient's had past epidural injections.  He has been treated with Lyrica in the past.  Lidoderm patches of been applied to his left paralumbar region he states this is given him some significant improvement in his pain.  MRI of the brain showed some typical age-related changes. MRI lumbar showed some left foraminal disc protrusion at L4-5 adjacent to the exiting L4 nerve root.  Mild to moderate left greater than right lateral recess stenosis at L5-S1 with some bilateral moderate foraminal stenosis.  Some left eccentric disc bulge at L2-3 and L3-4 without severe compression.  There was no acute abnormalities within the lumbar spine noted on MRI comparison to previous MRI done 10/07/2018.   Past Medical History:  Diagnosis Date  . Allergy   . Angina   . Arthritis   . Backache, unspecified   . CHF (congestive heart failure) ([place])   . Chills   . Chronic kidney disease, stage III (moderate)    HD T- TH-SAT  . Complication of anesthesia    01/2011 could not eat,, hospt x2, was placed on hd and cleared up  . Coronary atherosclerosis of native coronary artery   . Diabetes mellitus 2004  . Dizziness   . Dysphagia, unspecified(787.20)   . Fall at home 11/2019  . Gastroparesis   . Headache(784.0)   . Hemorrhage of rectum and anus   . Hyperlipidemia   . Hypertrophy of prostate with urinary  obstruction and other lower urinary tract symptoms (LUTS)   . INTERNAL HEMORRHOIDS 10/16/2008   Qualifier: Diagnosis of  By: Nolon Rod CMA (AAMA), Robin    . Internal hemorrhoids without mention of complication   . Myocardial infarction ([place]) 2002  . Nausea alone   . Obesity   . Other dyspnea and respiratory abnormality   . Other malaise and fatigue   . Personal history of unspecified circulatory disease   . Posttraumatic stress disorder   . Prostate cancer ([place])   . Sleep apnea    USES CPAP   . Stroke [facility])    2007  . Unspecified essential hypertension    hx htn     Past Surgical History:  Procedure Laterality Date  . ANAL FISSURECTOMY    . AV FISTULA PLACEMENT    . CARDIAC CATHETERIZATION     2005 DR BRODIE  . HEMORRHOID SURGERY    . PROSTATE BIOPSY    . revision of fistula     renal failure  . VENTRAL HERNIA REPAIR  07/01/2011   Procedure: HERNIA REPAIR VENTRAL ADULT;  Surgeon: Joyice Faster. Cornett, MD;  Location: [facility] OR;  Service: General;  Laterality: N/A;    Family History  Problem Relation Age of Onset  . Heart disease Father   . Renal Disease Father   . Dementia Mother   . Heart attack Mother   . Diabetes Sister   . Heart disease Sister   .  Diabetes Maternal Aunt   . Diabetes Maternal Uncle   . Diabetes Paternal Aunt   . Diabetes Paternal Uncle   . Heart disease Other   . Heart attack Brother   . Lung cancer Sister   . Renal Disease Brother   . Ovarian cancer Cousin   . Lung cancer Cousin   . Breast cancer Neg Hx   . Colon cancer Neg Hx   . Pancreatic cancer Neg Hx   . Prostate cancer Neg Hx     Social History:  reports that he has never smoked. He has never used smokeless tobacco. He reports that he does not drink alcohol and does not use drugs.  Allergies: No Known Allergies  Medications: I have reviewed the patient's current medications.  Results for orders placed or performed during the hospital encounter of 12/18/19 (from the past 48 hour(s))   Urinalysis, Routine w reflex microscopic     Status: None   Collection Time: 12/18/19  9:43 PM  Result Value Ref Range   Color, Urine YELLOW YELLOW   APPearance CLEAR CLEAR   Specific Gravity, Urine 1.012 1.005 - 1.030   pH 5.0 5.0 - 8.0   Glucose, UA NEGATIVE NEGATIVE mg/dL   Hgb urine dipstick NEGATIVE NEGATIVE   Bilirubin Urine NEGATIVE NEGATIVE   Ketones, ur NEGATIVE NEGATIVE mg/dL   Protein, ur NEGATIVE NEGATIVE mg/dL   Nitrite NEGATIVE NEGATIVE   Leukocytes,Ua NEGATIVE NEGATIVE    Comment: Performed at [facility] [address], [place], [place] [address]  CBG monitoring, ED     Status: None   Collection Time: 12/19/19  1:39 AM  Result Value Ref Range   Glucose-Capillary 97 70 - 99 mg/dL    Comment: Glucose reference range applies only to samples taken after fasting for at least 8 hours.  Troponin I (High Sensitivity)     Status: None   Collection Time: 12/19/19  1:47 AM  Result Value Ref Range   Troponin I (High Sensitivity) 12 <18 ng/L    Comment: (NOTE) Elevated high sensitivity troponin I (hsTnI) values and significant  changes across serial measurements may suggest ACS but many other  chronic and acute conditions are known to elevate hsTnI results.  Refer to the "Links" section for chest pain algorithms and additional  guidance. Performed at [facility], [place] [address], [place], [place] [address]   POC occult blood, ED RN will collect     Status: Abnormal   Collection Time: 12/19/19  4:15 AM  Result Value Ref Range   Fecal Occult Bld POSITIVE (A) NEGATIVE  Troponin I (High Sensitivity)     Status: None   Collection Time: 12/19/19  4:30 AM  Result Value Ref Range   Troponin I (High Sensitivity) 14 <18 ng/L    Comment: (NOTE) Elevated high sensitivity troponin I (hsTnI) values and significant  changes across serial measurements may suggest ACS but many other  chronic and acute conditions are known to elevate hsTnI results.  Refer to the "Links"  section for chest pain algorithms and additional  guidance. Performed at [facility], [place] [address], [place], [place] [address]   SARS Coronavirus 2 by RT PCR (hospital order, performed in [facility] hospital lab) Nasopharyngeal Nasopharyngeal Swab     Status: None   Collection Time: 12/19/19  5:58 AM   Specimen: Nasopharyngeal Swab  Result Value Ref Range   SARS Coronavirus 2 NEGATIVE NEGATIVE    Comment: (NOTE) SARS-CoV-2 target nucleic  acids are NOT DETECTED.  The SARS-CoV-2 RNA is generally detectable in upper and lower respiratory specimens during the acute phase of infection. The lowest concentration of SARS-CoV-2 viral copies this assay can detect is 250 copies / mL. A negative result does not preclude SARS-CoV-2 infection and should not be used as the sole basis for treatment or other patient management decisions.  A negative result may occur with improper specimen collection / handling, submission of specimen other than nasopharyngeal swab, presence of viral mutation(s) within the areas targeted by this assay, and inadequate number of viral copies (<250 copies / mL). A negative result must be combined with clinical observations, patient history, and epidemiological information.  Fact Sheet for Patients:   StrictlyIdeas.no  Fact Sheet for Healthcare Providers: BankingDealers.co.za  This test is not yet approved or  cleared by the [place] FDA and has been authorized for detection and/or diagnosis of SARS-CoV-2 by FDA under an Emergency Use Authorization (EUA).  This EUA will remain in effect (meaning this test can be used) for the duration of the COVID-19 declaration under Section 564(b)(1) of the Act, 21 U.S.C. section 360bbb-3(b)(1), unless the authorization is terminated or revoked sooner.  Performed at [facility], [place] [address] [place], [place] [address]   CBG monitoring, ED     Status:  Abnormal   Collection Time: 12/19/19 10:35 AM  Result Value Ref Range   Glucose-Capillary 120 (H) 70 - 99 mg/dL    Comment: Glucose reference range applies only to samples taken after fasting for at least 8 hours.   Comment 1 Notify RN    Comment 2 Document in Chart   MRSA PCR Screening     Status: None   Collection Time: 12/19/19  4:15 PM   Specimen: Nasal Mucosa; Nasopharyngeal  Result Value Ref Range   MRSA by PCR NEGATIVE NEGATIVE    Comment:        The GeneXpert MRSA Assay (FDA approved for NASAL specimens only), is one component of a comprehensive MRSA colonization surveillance program. It is not intended to diagnose MRSA infection nor to guide or monitor treatment for MRSA infections. Performed at [facility], [place] [address], [place], [place] [address]   Iron and TIBC     Status: Abnormal   Collection Time: 12/19/19  4:30 PM  Result Value Ref Range   Iron 45 45 - 182 ug/dL   TIBC 280 250 - 450 ug/dL   Saturation Ratios 16 (L) 17.9 - 39.5 %   UIBC 235 ug/dL    Comment: Performed at [facility] [address] [place], [place] [address]  Glucose, capillary     Status: Abnormal   Collection Time: 12/19/19  9:52 PM  Result Value Ref Range   Glucose-Capillary 177 (H) 70 - 99 mg/dL    Comment: Glucose reference range applies only to samples taken after fasting for at least 8 hours.   Comment 1 Notify RN    Comment 2 Document in Chart   Basic metabolic panel     Status: Abnormal   Collection Time: 12/20/19  1:14 AM  Result Value Ref Range   Sodium 141 135 - 145 mmol/L   Potassium 3.3 (L) 3.5 - 5.1 mmol/L   Chloride 101 98 - 111 mmol/L   CO2 28 22 - 32 mmol/L   Glucose, Bld 109 (H) 70 - 99 mg/dL    Comment: Glucose reference range applies only to samples taken after fasting for at  least 8 hours.   BUN 17 8 - 23 mg/dL   Creatinine, Ser 1.43 (H) 0.61 - 1.24 mg/dL   Calcium 9.4 8.9 - 10.3 mg/dL   GFR calc non Af Amer 49 (L) >60 mL/min   GFR calc Af Amer  57 (L) >60 mL/min   Anion gap 12 5 - 15    Comment: Performed at [facility] [address] [place], [place] [address]  CBC     Status: Abnormal   Collection Time: 12/20/19  1:14 AM  Result Value Ref Range   WBC 2.8 (L) 4.0 - 10.5 K/uL   RBC 4.33 4.22 - 5.81 MIL/uL   Hemoglobin 11.9 (L) 13.0 - 17.0 g/dL   HCT 37.3 (L) 39 - 52 %   MCV 86.1 80.0 - 100.0 fL   MCH 27.5 26.0 - 34.0 pg   MCHC 31.9 30.0 - 36.0 g/dL   RDW 13.8 11.5 - 15.5 %   Platelets 197 150 - 400 K/uL   nRBC 0.0 0.0 - 0.2 %    Comment: Performed at [facility], [place] [address], [place], [place] [address]  Glucose, capillary     Status: None   Collection Time: 12/20/19  6:44 AM  Result Value Ref Range   Glucose-Capillary 71 70 - 99 mg/dL    Comment: Glucose reference range applies only to samples taken after fasting for at least 8 hours.   Comment 1 Notify RN    Comment 2 Document in Chart   Glucose, capillary     Status: Abnormal   Collection Time: 12/20/19  1:31 PM  Result Value Ref Range   Glucose-Capillary 123 (H) 70 - 99 mg/dL    Comment: Glucose reference range applies only to samples taken after fasting for at least 8 hours.  Glucose, capillary     Status: Abnormal   Collection Time: 12/20/19  3:16 PM  Result Value Ref Range   Glucose-Capillary 151 (H) 70 - 99 mg/dL    Comment: Glucose reference range applies only to samples taken after fasting for at least 8 hours.  Glucose, capillary     Status: Abnormal   Collection Time: 12/20/19  4:12 PM  Result Value Ref Range   Glucose-Capillary 184 (H) 70 - 99 mg/dL    Comment: Glucose reference range applies only to samples taken after fasting for at least 8 hours.   Comment 1 Notify RN    Comment 2 Document in Chart     DG Chest 2 View  Result Date: 12/19/2019 CLINICAL DATA:  Recent fall with chest pain, initial encounter EXAM: CHEST - 2 VIEW COMPARISON:  11/13/2019 FINDINGS: Cardiac shadow is at the upper limits of normal in size. The lungs are  well aerated bilaterally. No focal infiltrate or effusion is seen. No acute bony abnormality is noted. IMPRESSION: No acute abnormality noted. Electronically Signed   By: Inez Catalina M.D.   On: 12/19/2019 01:35   MR BRAIN WO CONTRAST  Result Date: 12/19/2019 CLINICAL DATA:  Initial evaluation for acute dizziness, weakness. EXAM: MRI HEAD WITHOUT CONTRAST TECHNIQUE: Multiplanar, multiecho pulse sequences of the brain and surrounding structures were obtained without intravenous contrast. COMPARISON:  Comparison made with prior MRI from 02/01/2015. FINDINGS: Brain: Generalized age-related cerebral atrophy. Mild patchy T2/FLAIR hyperintensity seen within the periventricular white matter most consistent with chronic small vessel ischemic disease, mild for age. Few small remote left cerebellar infarcts noted, left PICA territory. No abnormal foci of restricted diffusion to suggest  acute or subacute ischemia. Gray-white matter differentiation maintained. No encephalomalacia to suggest chronic cortical infarction elsewhere within the brain. No susceptibility artifact to suggest acute or chronic intracranial hemorrhage. No mass lesion, midline shift or mass effect. No hydrocephalus or extra-axial fluid collection. Pituitary gland suprasellar region normal. Midline structures intact. Vascular: Major intracranial vascular flow voids are maintained. Skull and upper cervical spine: Craniocervical junction within normal limits. Bone marrow signal intensity diffusely heterogeneous but overall within normal limits. Few scattered benign hemangiomata noted. No worrisome osseous lesions. No scalp soft tissue abnormality. Sinuses/Orbits: Globes and orbital soft tissues within normal limits. Retention cyst noted within the left maxillary sinus. Moderate mucosal thickening noted within the right maxillary sinus. Paranasal sinuses are otherwise clear. No mastoid effusion. Inner ear structures grossly normal. Other: None. IMPRESSION:  1. No acute intracranial abnormality. 2. Mild age-related cerebral atrophy with chronic small vessel ischemic disease, with a few small remote left cerebellar infarcts, left PICA territory. Electronically Signed   By: Jeannine Boga M.D.   On: 12/19/2019 04:13   MR LUMBAR SPINE WO CONTRAST  Result Date: 12/19/2019 CLINICAL DATA:  Initial evaluation for low back pain with left lower extremity weakness. Increased falls. EXAM: MRI LUMBAR SPINE WITHOUT CONTRAST TECHNIQUE: Multiplanar, multisequence MR imaging of the lumbar spine was performed. No intravenous contrast was administered. COMPARISON:  Prior MRI from 10/07/2018. FINDINGS: Segmentation: Standard. Lowest well-formed disc space labeled the L5-S1 level. Alignment: 4 mm retrolisthesis of L5 on S1, stable. Associated trace anterolisthesis of L4 on L5, also unchanged. Alignment otherwise normal with preservation of the normal lumbar lordosis. Vertebrae: Vertebral body height maintained without evidence for acute or chronic fracture. Prominent chronic endplate Schmorl's node deformity noted at the superior endplate of L3, stable. Post radiation changes seen within the visualized bone marrow extending from the L5 level inferiorly into the sacrum. Elsewhere, visualized bone marrow signal intensity is mildly heterogeneous but overall within normal limits. Few scattered benign hemangiomata noted. No worrisome osseous lesions. No abnormal marrow edema. Conus medullaris and cauda equina: Conus extends to the L1 level. Conus and cauda equina appear normal. Paraspinal and other soft tissues: Paraspinous soft tissues demonstrate no acute finding. Bilateral renal atrophy with multiple scattered cysts noted bilaterally, largest of which measures 2 cm at the interpolar right kidney. A few of these are mildly complex in appearance, likely reflecting proteinaceous and/or hemorrhagic cyst as seen on prior abdominal MRI. Prominent distension of the partially visualized  urinary bladder. Disc levels: L1-2: Negative interspace. Mild facet hypertrophy. No canal or foraminal stenosis. L2-3: Mild diffuse disc bulge with disc desiccation. Disc bulging asymmetric to the left. Prominent endplate Schmorl's node deformity noted at the superior endplate of L3. Mild to moderate facet and ligament flavum hypertrophy. No significant spinal stenosis. Mild left L2 foraminal narrowing, unchanged. No significant right foraminal encroachment. L3-4: Disc desiccation with minimal disc bulge, slightly asymmetric to the left. Mild to moderate facet and ligament flavum hypertrophy. No significant spinal stenosis. Mild left L3 foraminal narrowing, stable. No significant right foraminal encroachment. L4-5: Mild diffuse disc bulge with disc desiccation. Superimposed small left foraminal disc protrusion is seen, slightly more prominent and focal in nature as compared to previous (series 8, image 27). This closely approximates the exiting left L4 nerve root. Moderate facet and ligament flavum hypertrophy with associated trace joint effusions. Resultant mild spinal stenosis, relatively unchanged. Mild left greater than right L4 foraminal narrowing, similar. L5-S1: Trace retrolisthesis. Diffuse disc bulge with disc desiccation and intervertebral disc space narrowing. Mild  reactive endplate spurring. Moderate bilateral facet hypertrophy. Resultant mild to moderate left greater than right lateral recess stenosis, descending S1 nerve root level. Moderate bilateral L5 foraminal narrowing, unchanged. IMPRESSION: 1. No acute abnormality within the lumbar spine. 2. Small left foraminal disc protrusion at L4-5, closely approximating and potentially irritating the exiting left L4 nerve root. This is slightly increased in size/prominence as compared to previous. 3. Multifactorial degenerative changes at L5-S1, resulting in mild to moderate left greater than right lateral recess stenosis, with moderate bilateral L5  foraminal narrowing. Overall appearance is stable from previous. 4. Mild left eccentric disc bulge and facet hypertrophy at L2-3 and L3-4 with resultant mild left L2 and L3 foraminal stenosis, similar to prior. Electronically Signed   By: Jeannine Boga M.D.   On: 12/19/2019 04:01   DG Knee Complete 4 Views Left  Result Date: 12/19/2019 CLINICAL DATA:  Recent fall with left knee pain, initial encounter EXAM: LEFT KNEE - COMPLETE 4+ VIEW COMPARISON:  None. FINDINGS: Mild medial joint space narrowing is noted. No acute fracture or dislocation is seen. No soft tissue abnormality is noted. IMPRESSION: Mild degenerative change without acute abnormality. Electronically Signed   By: Inez Catalina M.D.   On: 12/19/2019 01:36   DG Knee Complete 4 Views Right  Result Date: 12/19/2019 CLINICAL DATA:  Recent fall with right knee pain, initial encounter EXAM: RIGHT KNEE - COMPLETE 4+ VIEW COMPARISON:  None. FINDINGS: Mild medial joint space narrowing is noted. No acute fracture or dislocation is seen. No soft tissue abnormality is noted. IMPRESSION: Mild medial joint space narrowing.  No acute abnormality noted. Electronically Signed   By: Inez Catalina M.D.   On: 12/19/2019 01:34   ECHOCARDIOGRAM COMPLETE  Result Date: 12/19/2019    ECHOCARDIOGRAM REPORT   Patient Name:   [name] Date of Exam: 12/19/2019 Medical Rec #:  [id number]       Height:       70.5 in Accession #:    [id number]      Weight:       247.0 lb Date of Birth:  11-04-1950        BSA:          2.295 m Patient Age:    61 years        BP:           121/74 mmHg Patient Gender: M               HR:           81 bpm. Exam Location:  Inpatient Procedure: 2D Echo, Cardiac Doppler and Color Doppler Indications:    R07.9* Chest pain, unspecified  History:        Patient has prior history of Echocardiogram examinations, most                 recent 11/22/2017. CAD, Aortic Valve Disease, Signs/Symptoms:Chest                 Pain and Fever; Risk  Factors:Hypertension, Diabetes, Sleep Apnea                 and Dyslipidemia. Cancer. Ascending aorta dilation.  Sonographer:    Roseanna Rainbow RDCS Referring Phys: [id number] [facility]  Sonographer Comments: Technically difficult study due to poor echo windows and patient is morbidly obese. Image acquisition challenging due to patient body habitus. IMPRESSIONS  1. There is no significant left ventricular outflow tract gradient. Left ventricular ejection fraction, by estimation,  is >75%. The left ventricle has hyperdynamic function. The left ventricle has no regional wall motion abnormalities. There is severe concentric left ventricular hypertrophy. Left ventricular diastolic parameters are consistent with Grade I diastolic dysfunction (impaired relaxation).  2. Right ventricular systolic function is normal. The right ventricular size is normal. There is normal pulmonary artery systolic pressure.  3. There is a calcified nodule on anterior leaflet basilar surface. The mitral valve is degenerative. No evidence of mitral valve regurgitation. No evidence of mitral stenosis.  4. The aortic valve is tricuspid. Aortic valve regurgitation is trivial. Mild to moderate aortic valve sclerosis/calcification is present, without any evidence of aortic stenosis.  5. Aortic dilatation noted. 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  6. The inferior vena cava is normal in size with greater than 50% respiratory variability, suggesting right atrial pressure of 3 mmHg. FINDINGS  Left Ventricle: There is no significant left ventricular outflow tract gradient. Left ventricular ejection fraction, by estimation, is >75%. The left ventricle has hyperdynamic function. The left ventricle has no regional wall motion abnormalities. The left ventricular internal cavity size was normal in size. There is severe concentric left ventricular hypertrophy. Left ventricular diastolic parameters are consistent with Grade I  diastolic dysfunction (impaired relaxation). Right Ventricle: The right ventricular size is normal. No increase in right ventricular wall thickness. Right ventricular systolic function is normal. There is normal pulmonary artery systolic pressure. The tricuspid regurgitant velocity is 2.35 m/s, and  with an assumed right atrial pressure of 3 mmHg, the estimated right ventricular systolic pressure is 62.1 mmHg. Left Atrium: Left atrial size was normal in size. Right Atrium: Right atrial size was normal in size. Pericardium: There is no evidence of pericardial effusion. Mitral Valve: There is a calcified nodule on anterior leaflet basilar surface. The mitral valve is degenerative in appearance. Normal mobility of the mitral valve leaflets. Mild to moderate mitral annular calcification. No evidence of mitral valve regurgitation. No evidence of mitral valve stenosis. Tricuspid Valve: The tricuspid valve is normal in structure. Tricuspid valve regurgitation is not demonstrated. No evidence of tricuspid stenosis. Aortic Valve: The aortic valve is tricuspid. . There is moderate thickening and moderate calcification of the aortic valve. Aortic valve regurgitation is trivial. Mild to moderate aortic valve sclerosis/calcification is present, without any evidence of aortic stenosis. There is moderate thickening of the aortic valve. There is moderate calcification of the aortic valve. Pulmonic Valve: The pulmonic valve was normal in structure. Pulmonic valve regurgitation is not visualized. No evidence of pulmonic stenosis. Aorta: Aortic dilatation noted. 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 Venous: The inferior vena cava is normal in size with greater than 50% respiratory variability, suggesting right atrial pressure of 3 mmHg. IAS/Shunts: No atrial level shunt detected by color flow Doppler.  LEFT VENTRICLE PLAX 2D LVIDd:         3.20 cm     Diastology LVIDs:         2.10 cm     LV e' lateral:    6.42 cm/s LV PW:         1.60 cm     LV E/e' lateral: 8.6 LV IVS:        1.90 cm     LV e' medial:    8.27 cm/s LVOT diam:     1.80 cm     LV E/e' medial:  6.7 LV SV:         41 LV  SV Index:   18 LVOT Area:     2.54 cm  LV Volumes (MOD) LV vol d, MOD A4C: 40.5 ml LV vol s, MOD A4C: 8.8 ml LV SV MOD A4C:     40.5 ml RIGHT VENTRICLE RV S prime:     9.03 cm/s TAPSE (M-mode): 1.3 cm LEFT ATRIUM             Index       RIGHT ATRIUM           Index LA diam:        2.40 cm 1.05 cm/m  RA Area:     10.10 cm LA Vol (A2C):   21.9 ml 9.54 ml/m  RA Volume:   19.90 ml  8.67 ml/m LA Vol (A4C):   26.1 ml 11.37 ml/m LA Biplane Vol: 23.9 ml 10.41 ml/m  AORTIC VALVE LVOT Vmax:   97.30 cm/s LVOT Vmean:  72.100 cm/s LVOT VTI:    0.162 m  AORTA Ao Root diam: 3.50 cm MITRAL VALVE               TRICUSPID VALVE MV Area (PHT): 5.38 cm    TR Peak grad:   22.1 mmHg MV Decel Time: 141 msec    TR Vmax:        235.00 cm/s MV E velocity: 55.30 cm/s MV A velocity: 97.70 cm/s  SHUNTS MV E/A ratio:  0.57        Systemic VTI:  0.16 m                            Systemic Diam: 1.80 cm Candee Furbish MD Electronically signed by Candee Furbish MD Signature Date/Time: 12/19/2019/4:07:49 PM    Final     Review of Systems  Constitutional: Negative for fever.  HENT: Negative for ear discharge and nosebleeds.   Eyes: Positive for blurred vision.  Respiratory: Negative for wheezing.   Musculoskeletal: Positive for back pain.  Skin: Negative for rash.  Neurological: Negative for dizziness.   Blood pressure 125/76, pulse 103, temperature 98.5 F (36.9 C), temperature source Oral, resp. rate 13, height 5\' 10"  (1.778 m), weight (!) 105.2 kg, SpO2 98 %. Physical Exam HENT:     Head: Normocephalic.     Right Ear: External ear normal.     Left Ear: External ear normal.     Nose: Nose normal.     Mouth/Throat:     Mouth: Mucous membranes are moist.  Eyes:     Extraocular Movements: Extraocular movements intact.     Pupils: Pupils are equal,  round, and reactive to light.  Cardiovascular:     Rate and Rhythm: Normal rate.     Pulses: Normal pulses.  Pulmonary:     Effort: Pulmonary effort is normal.     Breath sounds: Wheezing present.  Abdominal:     General: Abdomen is flat.  Musculoskeletal:     Cervical back: Normal range of motion.  Skin:    Capillary Refill: Capillary refill takes less than 2 seconds.  Neurological:     General: No focal deficit present.     Mental Status: He is alert.  Psychiatric:        Mood and Affect: Mood normal.        Behavior: Behavior normal.        Thought Content: Thought content normal.     Assessment/Plan: Patient states he has gotten significant relief of the discomfort  with the lighted Derm patch.  He is walking to the bathroom on his own.  Patient has had decreased sensation ankle and feet.  MRI scan results were reviewed with patient.  At this point no surgery is indicated at this point.  He can continue the Lidoderm patches no follow-up with the office as needed.  From my standpoint he can be discharged after cardiac work-up is completed.  He can follow-up in the office in a few weeks. My cell [phone]  Marybelle Killings 12/20/2019, 9:21 PM

## 2019-12-20 NOTE — Progress Notes (Signed)
Progress Note    [name]  KCL:[id number] DOB: 01/19/1951  DOA: 12/18/2019 PCP: [name], MD    Brief Narrative:    Medical records reviewed and are as summarized below:  [name] is an 69 y.o. male with history h/o hypertension, hyperlipidemia chronic back pain, degenerative disc disease with neuropathy/gait imbalance, thoracic aortic aneurysm, CKD stage III, s/p renal transplant, prostate cancer, CAD/CHF presented to the ED after a fall.  Patient states he has chronic back pain and lower extremity weakness for which he follows orthopedics Dr. Lorin Mercy as outpatient and has undergone epidural injections almost yearly with some relief and also followed by neurology Dr. Merry Proud for neuropathy/tremors.  He was seen by neurology recently and gabapentin dosage was tapered down and concern for intermittent myoclonus and amitriptyline dosage was increased to help with pain and tremors.  Over the last 6 weeks patient has had worsening sciatic pain on the left side as well as weakness requiring him to use walker consistently (previously was using walker as needed on bad days).  Yesterday he was trying to reach the lower rack of clothes in his closet when he felt somewhat lightheaded resulting in a fall where he landed on his knees and then to the side.  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   -EKG unremarkable.   - Resume home muscle relaxants.   -troponin appear to be flat.   -echo: no  regional wall motion abnormalities  Lumbar radiculopathy/lower extremity weakness/falls: Seen by Dr. Lorin Mercy in May 2020 and recommended physical therapy evaluation and possibly reconsideration of epidural if symptoms worsen.  Patient has also been followed by neurology with recent adjustment in neuropathic agents as described above.   -resume Neurontin at 300 mg 3 times daily and Elavil per neurology recommendations last week.   -PT/OT evaluation.   -Lidocaine patch ordered with improvement.   - MRI lumbar spine: appears to 6 show severe degenerative disc disease/foraminal stenosis similar to prior imaging studies with no acute changes.  Orthostatic hypotension with h/o Hypertension:  -orthostatics positive  -hold norvasc -IVF and recheck in AM -check TSH/cortisol -? Related to DM -compression stockings -hold lasix  Prostrate cancer : Stage T1c adenocarcinoma of the prostate with Gleason score of 4+3, and PSA of 25.6.  Followed by urology and radiation oncology as outpatient.  CKD stage IIIa, s/p renal transplant: Appears to be at baseline with creatinine around 1.6.  Avoid nephrotoxic agents.  Resume chronic immunosuppressants.   Diabetes mellitus: Resume 70/30 insulin at 50 units twice daily (home dose per patient report).  Patient also on weekly shots of Semaglutide (q. Monday).  Diabetic diet.  Sliding scale insulin.   Leukopenia/anemia: Could be related to malignancy/radiation treatments.  Leukopenia appears to be chronic and stable.  Baseline hemoglobin appears to be around 13.  Currently down  to 11.7.  No signs of melena or hematochezia.    History of CVA: - aspirin Plavix and statins.    Sleep apnea  -resume CPAP  Hypokalemia -repleted  GERD:  -PPI  obesity Body mass index is 33.28 kg/m.   Family  Communication/Anticipated D/C date and plan/Code Status   DVT prophylaxis: Lovenox ordered. Code Status: Full Code.  Disposition Plan: Status is: Observation  The patient will require care spanning > 2 midnights and should be moved to inpatient because: Hemodynamically unstable- orthostatic  Dispo: The patient is from: Home              Anticipated d/c is to: Home              Anticipated d/c date is: 1 day              Patient currently is not medically stable to d/c.         Medical Consultants:    ortho    Subjective:   Had profound weakness and dizziness with orthostatics  Objective:    Vitals:   12/20/19 0910 12/20/19 0913 12/20/19 0915 12/20/19 1105  BP: 126/75 105/73 (!) 86/58 (!) 140/86  Pulse: 84   84  Resp: 16 21 19 15   Temp:    97.7 F (36.5 C)  TempSrc:    Oral  SpO2: 97%     Weight:      Height:        Intake/Output Summary (Last 24 hours) at 12/20/2019 1245 Last data filed at 12/20/2019 0700 Gross per 24 hour  Intake 1065 ml  Output 400 ml  Net 665 ml   Filed Weights   12/19/19 1613  Weight: (!) 105.2 kg    Exam:  General: Appearance:    Obese male in no acute distress     Lungs:     Clear to auscultation bilaterally, respirations unlabored  Heart:    Normal heart rate. Normal rhythm. No murmurs, rubs, or gallops.   MS:   All extremities are intact.        Data Reviewed:   I have personally reviewed following labs and imaging studies:  Labs: Labs show the following:   Basic Metabolic Panel: Recent Labs  Lab 12/18/19 1325 12/20/19 0114  NA 139 141  K 3.4* 3.3*  CL 99 101  CO2 29 28  GLUCOSE 131* 109*  BUN 21 17  CREATININE 1.63* 1.43*  CALCIUM 9.4 9.4   GFR Estimated Creatinine Clearance: 57.6 mL/min (A) (by C-G formula based on SCr of 1.43 mg/dL (H)). Liver Function Tests: No results for input(s): AST, ALT, ALKPHOS, BILITOT, PROT, ALBUMIN in the last 168 hours. No results for input(s): LIPASE, AMYLASE in the last  168 hours. No results for input(s): AMMONIA in the last 168 hours. Coagulation profile No results for input(s): INR, PROTIME in the last 168 hours.  CBC: Recent Labs  Lab 12/18/19 1325 12/20/19 0114  WBC 3.6* 2.8*  HGB 11.7* 11.9*  HCT 36.8* 37.3*  MCV 87.8 86.1  PLT 200 197   Cardiac Enzymes: No results for input(s): CKTOTAL, CKMB, CKMBINDEX, TROPONINI in the last 168 hours. BNP (last 3 results) No results for input(s): PROBNP in the last 8760 hours. CBG: Recent Labs  Lab 12/19/19 0139 12/19/19 1035 12/19/19 2152 12/20/19 0644  GLUCAP 97 120* 177* 71   D-Dimer: No results for input(s): DDIMER in the last 72 hours. Hgb A1c: No results for input(s): HGBA1C in the last 72 hours. Lipid  Profile: No results for input(s): CHOL, HDL, LDLCALC, TRIG, CHOLHDL, LDLDIRECT in the last 72 hours. Thyroid function studies: No results for input(s): TSH, T4TOTAL, T3FREE, THYROIDAB in the last 72 hours.  Invalid input(s): FREET3 Anemia work up: Recent Labs    12/19/19 1630  TIBC 280  IRON 45   Sepsis Labs: Recent Labs  Lab 12/18/19 1325 12/20/19 0114  WBC 3.6* 2.8*    Microbiology Recent Results (from the past 240 hour(s))  SARS Coronavirus 2 by RT PCR (hospital order, performed in [facility] hospital lab) Nasopharyngeal Nasopharyngeal Swab     Status: None   Collection Time: 12/19/19  5:58 AM   Specimen: Nasopharyngeal Swab  Result Value Ref Range Status   SARS Coronavirus 2 NEGATIVE NEGATIVE Final    Comment: (NOTE) SARS-CoV-2 target nucleic acids are NOT DETECTED.  The SARS-CoV-2 RNA is generally detectable in upper and lower respiratory specimens during the acute phase of infection. The lowest concentration of SARS-CoV-2 viral copies this assay can detect is 250 copies / mL. A negative result does not preclude SARS-CoV-2 infection and should not be used as the sole basis for treatment or other patient management decisions.  A negative result may occur  with improper specimen collection / handling, submission of specimen other than nasopharyngeal swab, presence of viral mutation(s) within the areas targeted by this assay, and inadequate number of viral copies (<250 copies / mL). A negative result must be combined with clinical observations, patient history, and epidemiological information.  Fact Sheet for Patients:   StrictlyIdeas.no  Fact Sheet for Healthcare Providers: BankingDealers.co.za  This test is not yet approved or  cleared by the [place] FDA and has been authorized for detection and/or diagnosis of SARS-CoV-2 by FDA under an Emergency Use Authorization (EUA).  This EUA will remain in effect (meaning this test can be used) for the duration of the COVID-19 declaration under Section 564(b)(1) of the Act, 21 U.S.C. section 360bbb-3(b)(1), unless the authorization is terminated or revoked sooner.  Performed at [facility], [place] [address] [place], [place] [address]   MRSA PCR Screening     Status: None   Collection Time: 12/19/19  4:15 PM   Specimen: Nasal Mucosa; Nasopharyngeal  Result Value Ref Range Status   MRSA by PCR NEGATIVE NEGATIVE Final    Comment:        The GeneXpert MRSA Assay (FDA approved for NASAL specimens only), is one component of a comprehensive MRSA colonization surveillance program. It is not intended to diagnose MRSA infection nor to guide or monitor treatment for MRSA infections. Performed at [facility], [place] [address], [place], [place] [address]     Procedures and diagnostic studies:  DG Chest 2 View  Result Date: 12/19/2019 CLINICAL DATA:  Recent fall with chest pain, initial encounter EXAM: CHEST - 2 VIEW COMPARISON:  11/13/2019 FINDINGS: Cardiac shadow is at the upper limits of normal in size. The lungs are well aerated bilaterally. No focal infiltrate or effusion is seen. No acute bony abnormality is noted. IMPRESSION:  No acute abnormality noted. Electronically Signed   By: Inez Catalina M.D.   On: 12/19/2019 01:35   MR BRAIN WO CONTRAST  Result Date: 12/19/2019 CLINICAL DATA:  Initial evaluation for acute dizziness, weakness. EXAM: MRI HEAD WITHOUT CONTRAST TECHNIQUE: Multiplanar, multiecho pulse sequences of the brain and surrounding structures were obtained without intravenous contrast. COMPARISON:  Comparison made with prior MRI from 02/01/2015. FINDINGS: Brain: Generalized age-related cerebral atrophy. Mild patchy T2/FLAIR  hyperintensity seen within the periventricular white matter most consistent with chronic small vessel ischemic disease, mild for age. Few small remote left cerebellar infarcts noted, left PICA territory. No abnormal foci of restricted diffusion to suggest acute or subacute ischemia. Gray-white matter differentiation maintained. No encephalomalacia to suggest chronic cortical infarction elsewhere within the brain. No susceptibility artifact to suggest acute or chronic intracranial hemorrhage. No mass lesion, midline shift or mass effect. No hydrocephalus or extra-axial fluid collection. Pituitary gland suprasellar region normal. Midline structures intact. Vascular: Major intracranial vascular flow voids are maintained. Skull and upper cervical spine: Craniocervical junction within normal limits. Bone marrow signal intensity diffusely heterogeneous but overall within normal limits. Few scattered benign hemangiomata noted. No worrisome osseous lesions. No scalp soft tissue abnormality. Sinuses/Orbits: Globes and orbital soft tissues within normal limits. Retention cyst noted within the left maxillary sinus. Moderate mucosal thickening noted within the right maxillary sinus. Paranasal sinuses are otherwise clear. No mastoid effusion. Inner ear structures grossly normal. Other: None. IMPRESSION: 1. No acute intracranial abnormality. 2. Mild age-related cerebral atrophy with chronic small vessel ischemic  disease, with a few small remote left cerebellar infarcts, left PICA territory. Electronically Signed   By: Jeannine Boga M.D.   On: 12/19/2019 04:13   MR LUMBAR SPINE WO CONTRAST  Result Date: 12/19/2019 CLINICAL DATA:  Initial evaluation for low back pain with left lower extremity weakness. Increased falls. EXAM: MRI LUMBAR SPINE WITHOUT CONTRAST TECHNIQUE: Multiplanar, multisequence MR imaging of the lumbar spine was performed. No intravenous contrast was administered. COMPARISON:  Prior MRI from 10/07/2018. FINDINGS: Segmentation: Standard. Lowest well-formed disc space labeled the L5-S1 level. Alignment: 4 mm retrolisthesis of L5 on S1, stable. Associated trace anterolisthesis of L4 on L5, also unchanged. Alignment otherwise normal with preservation of the normal lumbar lordosis. Vertebrae: Vertebral body height maintained without evidence for acute or chronic fracture. Prominent chronic endplate Schmorl's node deformity noted at the superior endplate of L3, stable. Post radiation changes seen within the visualized bone marrow extending from the L5 level inferiorly into the sacrum. Elsewhere, visualized bone marrow signal intensity is mildly heterogeneous but overall within normal limits. Few scattered benign hemangiomata noted. No worrisome osseous lesions. No abnormal marrow edema. Conus medullaris and cauda equina: Conus extends to the L1 level. Conus and cauda equina appear normal. Paraspinal and other soft tissues: Paraspinous soft tissues demonstrate no acute finding. Bilateral renal atrophy with multiple scattered cysts noted bilaterally, largest of which measures 2 cm at the interpolar right kidney. A few of these are mildly complex in appearance, likely reflecting proteinaceous and/or hemorrhagic cyst as seen on prior abdominal MRI. Prominent distension of the partially visualized urinary bladder. Disc levels: L1-2: Negative interspace. Mild facet hypertrophy. No canal or foraminal stenosis.  L2-3: Mild diffuse disc bulge with disc desiccation. Disc bulging asymmetric to the left. Prominent endplate Schmorl's node deformity noted at the superior endplate of L3. Mild to moderate facet and ligament flavum hypertrophy. No significant spinal stenosis. Mild left L2 foraminal narrowing, unchanged. No significant right foraminal encroachment. L3-4: Disc desiccation with minimal disc bulge, slightly asymmetric to the left. Mild to moderate facet and ligament flavum hypertrophy. No significant spinal stenosis. Mild left L3 foraminal narrowing, stable. No significant right foraminal encroachment. L4-5: Mild diffuse disc bulge with disc desiccation. Superimposed small left foraminal disc protrusion is seen, slightly more prominent and focal in nature as compared to previous (series 8, image 27). This closely approximates the exiting left L4 nerve root. Moderate facet and ligament flavum  hypertrophy with associated trace joint effusions. Resultant mild spinal stenosis, relatively unchanged. Mild left greater than right L4 foraminal narrowing, similar. L5-S1: Trace retrolisthesis. Diffuse disc bulge with disc desiccation and intervertebral disc space narrowing. Mild reactive endplate spurring. Moderate bilateral facet hypertrophy. Resultant mild to moderate left greater than right lateral recess stenosis, descending S1 nerve root level. Moderate bilateral L5 foraminal narrowing, unchanged. IMPRESSION: 1. No acute abnormality within the lumbar spine. 2. Small left foraminal disc protrusion at L4-5, closely approximating and potentially irritating the exiting left L4 nerve root. This is slightly increased in size/prominence as compared to previous. 3. Multifactorial degenerative changes at L5-S1, resulting in mild to moderate left greater than right lateral recess stenosis, with moderate bilateral L5 foraminal narrowing. Overall appearance is stable from previous. 4. Mild left eccentric disc bulge and facet hypertrophy  at L2-3 and L3-4 with resultant mild left L2 and L3 foraminal stenosis, similar to prior. Electronically Signed   By: Jeannine Boga M.D.   On: 12/19/2019 04:01   DG Knee Complete 4 Views Left  Result Date: 12/19/2019 CLINICAL DATA:  Recent fall with left knee pain, initial encounter EXAM: LEFT KNEE - COMPLETE 4+ VIEW COMPARISON:  None. FINDINGS: Mild medial joint space narrowing is noted. No acute fracture or dislocation is seen. No soft tissue abnormality is noted. IMPRESSION: Mild degenerative change without acute abnormality. Electronically Signed   By: Inez Catalina M.D.   On: 12/19/2019 01:36   DG Knee Complete 4 Views Right  Result Date: 12/19/2019 CLINICAL DATA:  Recent fall with right knee pain, initial encounter EXAM: RIGHT KNEE - COMPLETE 4+ VIEW COMPARISON:  None. FINDINGS: Mild medial joint space narrowing is noted. No acute fracture or dislocation is seen. No soft tissue abnormality is noted. IMPRESSION: Mild medial joint space narrowing.  No acute abnormality noted. Electronically Signed   By: Inez Catalina M.D.   On: 12/19/2019 01:34   ECHOCARDIOGRAM COMPLETE  Result Date: 12/19/2019    ECHOCARDIOGRAM REPORT   Patient Name:   NILAY MANGRUM Date of Exam: 12/19/2019 Medical Rec #:  [id number]       Height:       70.5 in Accession #:    [id number]      Weight:       247.0 lb Date of Birth:  05-28-1950        BSA:          2.295 m Patient Age:    24 years        BP:           121/74 mmHg Patient Gender: M               HR:           81 bpm. Exam Location:  Inpatient Procedure: 2D Echo, Cardiac Doppler and Color Doppler Indications:    R07.9* Chest pain, unspecified  History:        Patient has prior history of Echocardiogram examinations, most                 recent 11/22/2017. CAD, Aortic Valve Disease, Signs/Symptoms:Chest                 Pain and Fever; Risk Factors:Hypertension, Diabetes, Sleep Apnea                 and Dyslipidemia. Cancer. Ascending aorta dilation.  Sonographer:     Roseanna Rainbow RDCS Referring Phys: [id number] [facility]  Sonographer Comments: Technically difficult study  due to poor echo windows and patient is morbidly obese. Image acquisition challenging due to patient body habitus. IMPRESSIONS  1. There is no significant left ventricular outflow tract gradient. Left ventricular ejection fraction, by estimation, is >75%. The left ventricle has hyperdynamic function. The left ventricle has no regional wall motion abnormalities. There is severe concentric left ventricular hypertrophy. Left ventricular diastolic parameters are consistent with Grade I diastolic dysfunction (impaired relaxation).  2. Right ventricular systolic function is normal. The right ventricular size is normal. There is normal pulmonary artery systolic pressure.  3. There is a calcified nodule on anterior leaflet basilar surface. The mitral valve is degenerative. No evidence of mitral valve regurgitation. No evidence of mitral stenosis.  4. The aortic valve is tricuspid. Aortic valve regurgitation is trivial. Mild to moderate aortic valve sclerosis/calcification is present, without any evidence of aortic stenosis.  5. Aortic dilatation noted. 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  6. The inferior vena cava is normal in size with greater than 50% respiratory variability, suggesting right atrial pressure of 3 mmHg. FINDINGS  Left Ventricle: There is no significant left ventricular outflow tract gradient. Left ventricular ejection fraction, by estimation, is >75%. The left ventricle has hyperdynamic function. The left ventricle has no regional wall motion abnormalities. The left ventricular internal cavity size was normal in size. There is severe concentric left ventricular hypertrophy. Left ventricular diastolic parameters are consistent with Grade I diastolic dysfunction (impaired relaxation). Right Ventricle: The right ventricular size is normal. No increase in right ventricular  wall thickness. Right ventricular systolic function is normal. There is normal pulmonary artery systolic pressure. The tricuspid regurgitant velocity is 2.35 m/s, and  with an assumed right atrial pressure of 3 mmHg, the estimated right ventricular systolic pressure is 11.0 mmHg. Left Atrium: Left atrial size was normal in size. Right Atrium: Right atrial size was normal in size. Pericardium: There is no evidence of pericardial effusion. Mitral Valve: There is a calcified nodule on anterior leaflet basilar surface. The mitral valve is degenerative in appearance. Normal mobility of the mitral valve leaflets. Mild to moderate mitral annular calcification. No evidence of mitral valve regurgitation. No evidence of mitral valve stenosis. Tricuspid Valve: The tricuspid valve is normal in structure. Tricuspid valve regurgitation is not demonstrated. No evidence of tricuspid stenosis. Aortic Valve: The aortic valve is tricuspid. . There is moderate thickening and moderate calcification of the aortic valve. Aortic valve regurgitation is trivial. Mild to moderate aortic valve sclerosis/calcification is present, without any evidence of aortic stenosis. There is moderate thickening of the aortic valve. There is moderate calcification of the aortic valve. Pulmonic Valve: The pulmonic valve was normal in structure. Pulmonic valve regurgitation is not visualized. No evidence of pulmonic stenosis. Aorta: Aortic dilatation noted. 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 Venous: The inferior vena cava is normal in size with greater than 50% respiratory variability, suggesting right atrial pressure of 3 mmHg. IAS/Shunts: No atrial level shunt detected by color flow Doppler.  LEFT VENTRICLE PLAX 2D LVIDd:         3.20 cm     Diastology LVIDs:         2.10 cm     LV e' lateral:   6.42 cm/s LV PW:         1.60 cm     LV E/e' lateral: 8.6 LV IVS:        1.90 cm     LV e'  medial:    8.27 cm/s LVOT diam:     1.80  cm     LV E/e' medial:  6.7 LV SV:         41 LV SV Index:   18 LVOT Area:     2.54 cm  LV Volumes (MOD) LV vol d, MOD A4C: 40.5 ml LV vol s, MOD A4C: 8.8 ml LV SV MOD A4C:     40.5 ml RIGHT VENTRICLE RV S prime:     9.03 cm/s TAPSE (M-mode): 1.3 cm LEFT ATRIUM             Index       RIGHT ATRIUM           Index LA diam:        2.40 cm 1.05 cm/m  RA Area:     10.10 cm LA Vol (A2C):   21.9 ml 9.54 ml/m  RA Volume:   19.90 ml  8.67 ml/m LA Vol (A4C):   26.1 ml 11.37 ml/m LA Biplane Vol: 23.9 ml 10.41 ml/m  AORTIC VALVE LVOT Vmax:   97.30 cm/s LVOT Vmean:  72.100 cm/s LVOT VTI:    0.162 m  AORTA Ao Root diam: 3.50 cm MITRAL VALVE               TRICUSPID VALVE MV Area (PHT): 5.38 cm    TR Peak grad:   22.1 mmHg MV Decel Time: 141 msec    TR Vmax:        235.00 cm/s MV E velocity: 55.30 cm/s MV A velocity: 97.70 cm/s  SHUNTS MV E/A ratio:  0.57        Systemic VTI:  0.16 m                            Systemic Diam: 1.80 cm Candee Furbish MD Electronically signed by Candee Furbish MD Signature Date/Time: 12/19/2019/4:07:49 PM    Final     Medications:   . allopurinol  200 mg Oral Daily  . aspirin EC  81 mg Oral QHS  . atorvastatin  40 mg Oral Daily  . clopidogrel  75 mg Oral Daily  . diclofenac Sodium  2 g Topical QID  . enoxaparin (LOVENOX) injection  40 mg Subcutaneous Q24H  . [START ON 12/21/2019] furosemide  40 mg Oral BID  . gabapentin  300 mg Oral TID  . insulin aspart protamine- aspart  50 Units Subcutaneous BID WC  . lidocaine  1 patch Transdermal Q24H  . magnesium oxide  200 mg Oral QPM  . mycophenolate  180 mg Oral QHS  . mycophenolate  360 mg Oral Daily  . nortriptyline  100 mg Oral QHS  . pantoprazole  40 mg Oral Daily  . sodium chloride flush  3 mL Intravenous Once  . tacrolimus  5 mg Oral BID  . [START ON 12/21/2019] Vitamin D (Ergocalciferol)  50,000 Units Oral Once per day on Mon Thu   Continuous Infusions: . sodium chloride 100 mL/hr at 12/20/19 0944     LOS: 0 days   Geradine Girt  [facility]   How to contact the [facility] Attending or Consulting provider [place] or covering provider during after hours [place], for this patient?  1. Check the care team in [facility] and look for a) attending/consulting [facility] provider listed and b) the [facility] team listed 2. Log into [email or web address] and use [facility]'s universal password to  access. If you do not have the password, please contact the hospital operator. 3. Locate the [facility] provider you are looking for under [facility] and page to a number that you can be directly reached. 4. If you still have difficulty reaching the provider, please page the [facility] (Director on Call) for the Hospitalists listed on amion for assistance.  12/20/2019, 12:45 PM

## 2019-12-21 LAB — CBC
HCT: 37.2 % — ABNORMAL LOW (ref 39.0–52.0)
Hemoglobin: 12.2 g/dL — ABNORMAL LOW (ref 13.0–17.0)
MCH: 28.4 pg (ref 26.0–34.0)
MCHC: 32.8 g/dL (ref 30.0–36.0)
MCV: 86.5 fL (ref 80.0–100.0)
Platelets: 186 10*3/uL (ref 150–400)
RBC: 4.3 MIL/uL (ref 4.22–5.81)
RDW: 14 % (ref 11.5–15.5)
WBC: 3.5 10*3/uL — ABNORMAL LOW (ref 4.0–10.5)
nRBC: 0 % (ref 0.0–0.2)

## 2019-12-21 LAB — GLUCOSE, CAPILLARY
Glucose-Capillary: 122 mg/dL — ABNORMAL HIGH (ref 70–99)
Glucose-Capillary: 133 mg/dL — ABNORMAL HIGH (ref 70–99)
Glucose-Capillary: 108 mg/dL — ABNORMAL HIGH (ref 70–99)
Glucose-Capillary: 134 mg/dL — ABNORMAL HIGH (ref 70–99)
Glucose-Capillary: 128 mg/dL — ABNORMAL HIGH (ref 70–99)

## 2019-12-21 LAB — BASIC METABOLIC PANEL
Anion gap: 9 (ref 5–15)
BUN: 15 mg/dL (ref 8–23)
CO2: 27 mmol/L (ref 22–32)
Calcium: 9.3 mg/dL (ref 8.9–10.3)
Chloride: 104 mmol/L (ref 98–111)
Creatinine, Ser: 1.37 mg/dL — ABNORMAL HIGH (ref 0.61–1.24)
GFR calc Af Amer: 60 mL/min — ABNORMAL LOW (ref >60)
GFR calc non Af Amer: 52 mL/min — ABNORMAL LOW (ref >60)
Glucose, Bld: 142 mg/dL — ABNORMAL HIGH (ref 70–99)
Potassium: 3.6 mmol/L (ref 3.5–5.1)
Sodium: 140 mmol/L (ref 135–145)

## 2019-12-21 LAB — TSH: TSH: 2.015 u[IU]/mL (ref 0.350–4.500)

## 2019-12-21 LAB — CORTISOL-AM, BLOOD: Cortisol - AM: 3.6 ug/dL — ABNORMAL LOW (ref 6.7–22.6)

## 2019-12-21 MED ORDER — COSYNTROPIN 0.25 MG IJ SOLR
0.2500 mg | Freq: Once | INTRAMUSCULAR | Status: AC
  Administered 2019-12-22: 0.25 mg via INTRAVENOUS
  Filled 2019-12-21: qty 0.25

## 2019-12-21 MED ORDER — DICLOFENAC SODIUM 1 % EX GEL: 2.0000 g | Freq: Four times a day (QID) | CUTANEOUS | 0 refills | Status: DC

## 2019-12-21 MED ORDER — FUROSEMIDE 40 MG PO TABS: 40.0000 mg | ORAL_TABLET | Freq: Every day | ORAL | Status: DC

## 2019-12-21 MED ORDER — LIDOCAINE 5 % EX PTCH: 1.0000 | MEDICATED_PATCH | CUTANEOUS | 0 refills | Status: DC

## 2019-12-21 NOTE — Plan of Care (Signed)

## 2019-12-21 NOTE — Progress Notes (Signed)
Progress Note    [name]  NIO:[id number] DOB: 1950/11/25  DOA: 12/18/2019 PCP: [name], MD    Brief Narrative:    Medical records reviewed and are as summarized below:  [name] is an 69 y.o. male with history h/o hypertension, hyperlipidemia chronic back pain, degenerative disc disease with neuropathy/gait imbalance, thoracic aortic aneurysm, CKD stage III, s/p renal transplant, prostate cancer, CAD/CHF presented to the ED after a fall.  Patient states he has chronic back pain and lower extremity weakness for which he follows orthopedics Dr. Lorin Mercy as outpatient and has undergone epidural injections almost yearly with some relief and also followed by neurology Dr. Merry Proud for neuropathy/tremors.  He was seen by neurology recently and gabapentin dosage was tapered down and concern for intermittent myoclonus and amitriptyline dosage was increased to help with pain and tremors.  Over the last 6 weeks patient has had worsening sciatic pain on the left side as well as weakness requiring him to use walker consistently (previously was using walker as needed on bad days).  Yesterday he was trying to reach the lower rack of clothes in his closet when he felt somewhat lightheaded resulting in a fall where he landed on his knees and then to the side.  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   -EKG unremarkable.   - Resume home muscle relaxants.   -troponin appear to be flat.   -echo: no  regional wall motion abnormalities  Lumbar radiculopathy/lower extremity weakness/falls: Seen by Dr. Lorin Mercy in May 2020 and recommended physical therapy evaluation and possibly reconsideration of epidural if symptoms worsen.  Patient has also been followed by neurology with recent adjustment in neuropathic agents as described above.   -resume Neurontin at 300 mg 3 times daily and Elavil per neurology recommendations last week.   -PT/OT evaluation.   -Lidocaine patch ordered with improvement.   - MRI lumbar spine: appears to 6 show severe degenerative disc disease/foraminal stenosis similar to prior imaging studies with no acute changes.  Orthostatic hypotension with h/o Hypertension:  -orthostatics positive  -hold norvasc -cortisol low -? Related to DM -compression stockings -hold lasix  Prostrate cancer : Stage T1c adenocarcinoma of the prostate with Gleason score of 4+3, and PSA of 25.6.  Followed by urology and radiation oncology as outpatient.  CKD stage IIIa, s/p renal transplant: Appears to be at baseline with creatinine around 1.6.  Avoid nephrotoxic agents.  Resume chronic immunosuppressants.   Diabetes mellitus: Resume 70/30 insulin at 50 units twice daily (home dose per patient report).  Patient also on weekly shots of Semaglutide (q. Monday).  Diabetic diet.  Sliding scale insulin.   Leukopenia/anemia: Could be related to malignancy/radiation treatments.  Leukopenia appears to be chronic and stable.  Baseline hemoglobin appears to  be around 13.  Currently down to 11.7.  No signs of melena or hematochezia.    History of CVA: - aspirin Plavix and statins.    Sleep apnea  -resume CPAP  Hypokalemia -repleted  GERD:  -PPI  obesity Body mass  index is 33.28 kg/m.   Family Communication/Anticipated D/C date and plan/Code Status   DVT prophylaxis: Lovenox ordered. Code Status: Full Code.  Disposition Plan: inpt  Dispo: The patient is from: Home              Anticipated d/c is to: Home              Anticipated d/c date is: 1 day              Patient currently is not medically stable to d/c.         Medical Consultants:    ortho    Subjective:   initially wanted to finish work up as outpatient but now agreeable to stay  Objective:    Vitals:   12/21/19 1019 12/21/19 1033 12/21/19 1034 12/21/19 1103  BP: (!) 92/60 125/83 106/73   Pulse: 97 86 87   Resp: 22 15 17    Temp:    98.1 F (36.7 C)  TempSrc:    Oral  SpO2: (!) 84% 97% 95% 99%  Weight:      Height:        Intake/Output Summary (Last 24 hours) at 12/21/2019 1603 Last data filed at 12/21/2019 0200 Gross per 24 hour  Intake 1001.67 ml  Output 350 ml  Net 651.67 ml   Filed Weights   12/19/19 1613  Weight: (!) 105.2 kg    Exam:   General: Appearance:    Obese male in no acute distress     Lungs:     respirations unlabored  Heart:    Normal heart rate. Normal rhythm. No murmurs, rubs, or gallops.   MS:   All extremities are intact.  No edema        Data Reviewed:   I have personally reviewed following labs and imaging studies:  Labs: Labs show the following:   Basic Metabolic Panel: Recent Labs  Lab 12/18/19 1325 12/18/19 1325 12/20/19 0114 12/21/19 0207  NA 139  --  141 140  K 3.4*   < > 3.3* 3.6  CL 99  --  101 104  CO2 29  --  28 27  GLUCOSE 131*  --  109* 142*  BUN 21  --  17 15  CREATININE 1.63*  --  1.43* 1.37*  CALCIUM 9.4  --  9.4 9.3   < > = values in this interval not displayed.   GFR Estimated Creatinine Clearance: 60.1 mL/min (A) (by C-G formula based on SCr of 1.37 mg/dL (H)). Liver Function Tests: No results for input(s): AST, ALT, ALKPHOS, BILITOT, PROT, ALBUMIN in the last 168 hours. No results  for input(s): LIPASE, AMYLASE in the last 168 hours. No results for input(s): AMMONIA in the last 168 hours. Coagulation profile No results for input(s): INR, PROTIME in the last 168 hours.  CBC: Recent Labs  Lab 12/18/19 1325 12/20/19 0114 12/21/19 0207  WBC 3.6* 2.8* 3.5*  HGB 11.7* 11.9* 12.2*  HCT 36.8* 37.3* 37.2*  MCV 87.8 86.1 86.5  PLT 200 197 186   Cardiac Enzymes: No results for input(s): CKTOTAL, CKMB, CKMBINDEX, TROPONINI in the last 168 hours. BNP (last 3 results) No results for input(s): PROBNP in the last 8760 hours.  CBG: Recent Labs  Lab 12/20/19 1331 12/20/19 1516 12/20/19 1612 12/21/19 0624 12/21/19 1106  GLUCAP 123* 151* 184* 122* 133*   D-Dimer: No results for input(s): DDIMER in the last 72 hours. Hgb A1c: No results for input(s): HGBA1C in the last 72 hours. Lipid Profile: No results for input(s): CHOL, HDL, LDLCALC, TRIG, CHOLHDL, LDLDIRECT in the last 72 hours. Thyroid function studies: Recent Labs    12/21/19 0207  TSH 2.015   Anemia work up: Recent Labs    12/19/19 1630  TIBC 280  IRON 45   Sepsis Labs: Recent Labs  Lab 12/18/19 1325 12/20/19 0114 12/21/19 0207  WBC 3.6* 2.8* 3.5*    Microbiology Recent Results (from the past 240 hour(s))  SARS Coronavirus 2 by RT PCR (hospital order, performed in [facility] hospital lab) Nasopharyngeal Nasopharyngeal Swab     Status: None   Collection Time: 12/19/19  5:58 AM   Specimen: Nasopharyngeal Swab  Result Value Ref Range Status   SARS Coronavirus 2 NEGATIVE NEGATIVE Final    Comment: (NOTE) SARS-CoV-2 target nucleic acids are NOT DETECTED.  The SARS-CoV-2 RNA is generally detectable in upper and lower respiratory specimens during the acute phase of infection. The lowest concentration of SARS-CoV-2 viral copies this assay can detect is 250 copies / mL. A negative result does not preclude SARS-CoV-2 infection and should not be used as the sole basis for treatment or  other patient management decisions.  A negative result may occur with improper specimen collection / handling, submission of specimen other than nasopharyngeal swab, presence of viral mutation(s) within the areas targeted by this assay, and inadequate number of viral copies (<250 copies / mL). A negative result must be combined with clinical observations, patient history, and epidemiological information.  Fact Sheet for Patients:   StrictlyIdeas.no  Fact Sheet for Healthcare Providers: BankingDealers.co.za  This test is not yet approved or  cleared by the [place] FDA and has been authorized for detection and/or diagnosis of SARS-CoV-2 by FDA under an Emergency Use Authorization (EUA).  This EUA will remain in effect (meaning this test can be used) for the duration of the COVID-19 declaration under Section 564(b)(1) of the Act, 21 U.S.C. section 360bbb-3(b)(1), unless the authorization is terminated or revoked sooner.  Performed at [facility], [place] [address] [place], [place] [address]   MRSA PCR Screening     Status: None   Collection Time: 12/19/19  4:15 PM   Specimen: Nasal Mucosa; Nasopharyngeal  Result Value Ref Range Status   MRSA by PCR NEGATIVE NEGATIVE Final    Comment:        The GeneXpert MRSA Assay (FDA approved for NASAL specimens only), is one component of a comprehensive MRSA colonization surveillance program. It is not intended to diagnose MRSA infection nor to guide or monitor treatment for MRSA infections. Performed at [facility], [place] [address], [place], [place] [address]     Procedures and diagnostic studies:  No results found.  Medications:   . allopurinol  200 mg Oral Daily  . aspirin EC  81 mg Oral QHS  . atorvastatin  40 mg Oral Daily  . clopidogrel  75 mg Oral Daily  . [START ON 12/22/2019] cosyntropin  0.25 mg Intravenous Once  . diclofenac Sodium  2 g Topical QID  .  enoxaparin (LOVENOX) injection  40 mg Subcutaneous Q24H  . gabapentin  300 mg Oral TID  . insulin aspart protamine- aspart  50 Units Subcutaneous BID  WC  . lidocaine  1 patch Transdermal Q24H  . magnesium oxide  200 mg Oral QPM  . mycophenolate  180 mg Oral QHS  . mycophenolate  360 mg Oral Daily  . nortriptyline  100 mg Oral QHS  . pantoprazole  40 mg Oral Daily  . sodium chloride flush  3 mL Intravenous Once  . tacrolimus  5 mg Oral BID  . Vitamin D (Ergocalciferol)  50,000 Units Oral Once per day on Mon Thu   Continuous Infusions:    LOS: 1 day   Geradine Girt  [facility]   How to contact the [facility] Attending or Consulting provider [place] or covering provider during after hours [place], for this patient?  1. Check the care team in [facility] and look for a) attending/consulting [facility] provider listed and b) the [facility] team listed 2. Log into [email or web address] and use [facility] universal password to access. If you do not have the password, please contact the hospital operator. 3. Locate the [facility] provider you are looking for under [facility] and page to a number that you can be directly reached. 4. If you still have difficulty reaching the provider, please page the [facility] (Director on Call) for the Hospitalists listed on amion for assistance.  12/21/2019, 4:03 PM

## 2019-12-21 NOTE — Progress Notes (Signed)
Orthostatic b/p

## 2019-12-21 NOTE — Progress Notes (Signed)
Doing orthostatic B/P

## 2019-12-22 LAB — GLUCOSE, CAPILLARY
Glucose-Capillary: 61 mg/dL — ABNORMAL LOW (ref 70–99)
Glucose-Capillary: 103 mg/dL — ABNORMAL HIGH (ref 70–99)
Glucose-Capillary: 173 mg/dL — ABNORMAL HIGH (ref 70–99)

## 2019-12-22 LAB — ACTH STIMULATION, 3 TIME POINTS
Cortisol, 30 Min: 21 ug/dL
Cortisol, 60 Min: 25.8 ug/dL
Cortisol, Base: 10.4 ug/dL

## 2019-12-22 MED ORDER — FUROSEMIDE 40 MG PO TABS: 40.0000 mg | ORAL_TABLET | Freq: Every day | ORAL | Status: DC | PRN

## 2019-12-22 NOTE — Progress Notes (Signed)
Discharge orders given to patient with daughter at bedside. IV was removed without complication, site clean dry and intact. Patient understands all discharge orders and follow up appointments.

## 2019-12-22 NOTE — Discharge Summary (Signed)
Physician Discharge Summary  [name] DZH:[id number] DOB: 02/08/1951 DOA: 12/18/2019  PCP: [name], MD  Admit date: 12/18/2019 Discharge date: 12/22/2019  Admitted From: home Discharge disposition: home   Recommendations for Outpatient Follow-Up:   1. Follow up orthostatics-- lasix change to PRN for now- adjust as needed 2. BMP 1 week 3. TED hose/compression stockings 4. 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  Diet recommendation: Low sodium, heart healthy.  Carbohydrate-modified.  Wound care: None.  Code status: Full.   History of Present Illness:   [name] is a 69 y.o. male with history h/o hypertension, hyperlipidemia chronic back pain, degenerative disc disease with neuropathy/gait imbalance, thoracic aortic aneurysm, CKD stage III, s/p renal transplant, prostate cancer, CAD/CHF presented to the ED after a fall.  Patient states he has chronic back pain and lower extremity weakness for which he follows orthopedics Dr. Lorin Mercy as outpatient and has undergone epidural injections almost yearly with some relief and also followed by neurology Dr. Merry Proud for neuropathy/tremors.  He was seen by neurology recently and gabapentin dosage was tapered down and concern for intermittent myoclonus and amitriptyline dosage was increased to help with  pain and tremors.  Over the last 6 weeks patient has had worsening sciatic pain on the left side as well as weakness requiring him to use walker consistently (previously was using walker as needed on bad days).  Yesterday he was trying to reach the lower rack of clothes in his closet when he felt somewhat lightheaded resulting in a fall where he landed on his knees and then to the side.  Denies any loss of consciousness.  When he was trying to get up he felt left-sided chest discomfort as well as neck pain.  He describes chest discomfort as sharp pain lasting 1 to 2 minutes, nonradiating, not associated with nausea/vomiting or palpitations or sweating.  Patient denies any direct neck trauma but feels he might of pulled his neck in the process.  Patient reports undergoing epidural injections through orthopedics previously which helped him for few months.  He is scheduled to follow-up again with Dr. Lorin Mercy this winter.  He feels his symptoms might have gotten worse since gabapentin dosage was reduced.  He has had intolerance to Lyrica with exacerbation of PTSD symptoms per last neurology note.  Patient does not want to use oral steroids in concern for uncontrolled diabetes.   Hospital Course by Problem:   atypical chest pain with orthostatic dizziness:  -chest pain is likely musculoskeletal after fall/muscle pull.  -EKG unremarkable.  -Resume home muscle relaxants.  -troponin appear to be flat.  -echo: no  regional wall motion abnormalities  Lumbar radiculopathy/lower extremity weakness/falls: Seen by Dr. Lorin Mercy in May 2020 and recommended physical therapy evaluation and possibly reconsideration of epidural if symptoms worsen. Patient has also been followed  by neurology with recent adjustment in neuropathic agents as described above.  -resume Neurontin at 300 mg 3 times daily and Elavil per neurology recommendations last week.  -PT/OT evaluation.  -Lidocaine patch ordered with improvement.   -MRI lumbar spine: appears to 6 show severe degenerative disc disease/foraminal stenosis similar to prior imaging studies with no acute changes.  Orthostatic hypotension with h/oHypertension:  -orthostatics positive -hold norvasc -cortisol low initially but stim test ok -? Related to DM vs over diuresis etc -compression stockings -close outpatient follow up  Prostate cancer :Stage T1c adenocarcinoma of the prostate with Gleason score of 4+3, and PSA of 25.6.Followed by urology and radiation oncology as outpatient.  CKD stage IIIa, s/p renal transplant: Appears to be at baseline with creatinine around 1.6. Avoid nephrotoxic agents. Resume chronic immunosuppressants.  Diabetes mellitus:Resume 70/30 insulin at 50 units twice daily (home dose per patient report). Patient also on weekly shots of Semaglutide(q. Monday). Diabetic diet.   Leukopenia/anemia: Could be related to malignancy/radiation treatments. Leukopenia appears to be chronic and stable. Baseline hemoglobin appears to be around 13. Currently down to 11.7. No signs of melena or hematochezia.   History of CVA: - aspirin Plavix and statins.  Sleep apnea  -resume CPAP  Hypokalemia -repleted  GERD: -PPI  obesity Body mass index is 33.28 kg/m.     Medical Consultants:      Discharge Exam:   Vitals:   12/22/19 0310 12/22/19 0835  BP: 128/84 (!) 142/80  Pulse: 83 85  Resp: 16 17  Temp: 97.6 F (36.4 C)   SpO2: 94% 96%   Vitals:   12/21/19 1918 12/21/19 2309 12/22/19 0310 12/22/19 0835  BP: 121/75 (!) 133/93 128/84 (!) 142/80  Pulse: 87 78 83 85  Resp: 15 15 16 17   Temp: 97.7 F (36.5 C) 97.7 F (36.5 C) 97.6 F (36.4 C)   TempSrc: Oral Oral Oral   SpO2:  100% 94% 96%  Weight:      Height:        General exam: Appears calm and comfortable.   The results of significant diagnostics from this hospitalization (including imaging, microbiology, ancillary and  laboratory) are listed below for reference.     Procedures and Diagnostic Studies:   DG Chest 2 View  Result Date: 12/19/2019 CLINICAL DATA:  Recent fall with chest pain, initial encounter EXAM: CHEST - 2 VIEW COMPARISON:  11/13/2019 FINDINGS: Cardiac shadow is at the upper limits of normal in size. The lungs are well aerated bilaterally. No focal infiltrate or effusion is seen. No acute bony abnormality is noted. IMPRESSION: No acute abnormality noted. Electronically Signed   By: Inez Catalina M.D.   On: 12/19/2019 01:35   MR BRAIN WO CONTRAST  Result Date: 12/19/2019 CLINICAL DATA:  Initial evaluation for acute dizziness, weakness. EXAM: MRI HEAD WITHOUT CONTRAST TECHNIQUE: Multiplanar, multiecho pulse sequences of the brain and surrounding structures were obtained without intravenous contrast. COMPARISON:  Comparison made with prior MRI from 02/01/2015. FINDINGS: Brain: Generalized age-related cerebral atrophy. Mild patchy T2/FLAIR hyperintensity seen within the periventricular white matter most consistent with chronic small vessel ischemic disease, mild for age. Few small remote left cerebellar infarcts noted, left PICA territory. No abnormal foci of restricted diffusion to suggest acute or subacute ischemia. Gray-white matter differentiation maintained. No encephalomalacia to suggest chronic cortical infarction elsewhere within the brain. No susceptibility artifact to suggest acute or chronic intracranial hemorrhage. No mass lesion, midline shift or mass effect. No hydrocephalus or extra-axial fluid collection. Pituitary gland suprasellar  region normal. Midline structures intact. Vascular: Major intracranial vascular flow voids are maintained. Skull and upper cervical spine: Craniocervical junction within normal limits. Bone marrow signal intensity diffusely heterogeneous but overall within normal limits. Few scattered benign hemangiomata noted. No worrisome osseous lesions. No scalp soft tissue  abnormality. Sinuses/Orbits: Globes and orbital soft tissues within normal limits. Retention cyst noted within the left maxillary sinus. Moderate mucosal thickening noted within the right maxillary sinus. Paranasal sinuses are otherwise clear. No mastoid effusion. Inner ear structures grossly normal. Other: None. IMPRESSION: 1. No acute intracranial abnormality. 2. Mild age-related cerebral atrophy with chronic small vessel ischemic disease, with a few small remote left cerebellar infarcts, left PICA territory. Electronically Signed   By: Jeannine Boga M.D.   On: 12/19/2019 04:13   MR LUMBAR SPINE WO CONTRAST  Result Date: 12/19/2019 CLINICAL DATA:  Initial evaluation for low back pain with left lower extremity weakness. Increased falls. EXAM: MRI LUMBAR SPINE WITHOUT CONTRAST TECHNIQUE: Multiplanar, multisequence MR imaging of the lumbar spine was performed. No intravenous contrast was administered. COMPARISON:  Prior MRI from 10/07/2018. FINDINGS: Segmentation: Standard. Lowest well-formed disc space labeled the L5-S1 level. Alignment: 4 mm retrolisthesis of L5 on S1, stable. Associated trace anterolisthesis of L4 on L5, also unchanged. Alignment otherwise normal with preservation of the normal lumbar lordosis. Vertebrae: Vertebral body height maintained without evidence for acute or chronic fracture. Prominent chronic endplate Schmorl's node deformity noted at the superior endplate of L3, stable. Post radiation changes seen within the visualized bone marrow extending from the L5 level inferiorly into the sacrum. Elsewhere, visualized bone marrow signal intensity is mildly heterogeneous but overall within normal limits. Few scattered benign hemangiomata noted. No worrisome osseous lesions. No abnormal marrow edema. Conus medullaris and cauda equina: Conus extends to the L1 level. Conus and cauda equina appear normal. Paraspinal and other soft tissues: Paraspinous soft tissues demonstrate no acute  finding. Bilateral renal atrophy with multiple scattered cysts noted bilaterally, largest of which measures 2 cm at the interpolar right kidney. A few of these are mildly complex in appearance, likely reflecting proteinaceous and/or hemorrhagic cyst as seen on prior abdominal MRI. Prominent distension of the partially visualized urinary bladder. Disc levels: L1-2: Negative interspace. Mild facet hypertrophy. No canal or foraminal stenosis. L2-3: Mild diffuse disc bulge with disc desiccation. Disc bulging asymmetric to the left. Prominent endplate Schmorl's node deformity noted at the superior endplate of L3. Mild to moderate facet and ligament flavum hypertrophy. No significant spinal stenosis. Mild left L2 foraminal narrowing, unchanged. No significant right foraminal encroachment. L3-4: Disc desiccation with minimal disc bulge, slightly asymmetric to the left. Mild to moderate facet and ligament flavum hypertrophy. No significant spinal stenosis. Mild left L3 foraminal narrowing, stable. No significant right foraminal encroachment. L4-5: Mild diffuse disc bulge with disc desiccation. Superimposed small left foraminal disc protrusion is seen, slightly more prominent and focal in nature as compared to previous (series 8, image 27). This closely approximates the exiting left L4 nerve root. Moderate facet and ligament flavum hypertrophy with associated trace joint effusions. Resultant mild spinal stenosis, relatively unchanged. Mild left greater than right L4 foraminal narrowing, similar. L5-S1: Trace retrolisthesis. Diffuse disc bulge with disc desiccation and intervertebral disc space narrowing. Mild reactive endplate spurring. Moderate bilateral facet hypertrophy. Resultant mild to moderate left greater than right lateral recess stenosis, descending S1 nerve root level. Moderate bilateral L5 foraminal narrowing, unchanged. IMPRESSION: 1. No acute abnormality within the lumbar spine. 2. Small left foraminal disc  protrusion at  L4-5, closely approximating and potentially irritating the exiting left L4 nerve root. This is slightly increased in size/prominence as compared to previous. 3. Multifactorial degenerative changes at L5-S1, resulting in mild to moderate left greater than right lateral recess stenosis, with moderate bilateral L5 foraminal narrowing. Overall appearance is stable from previous. 4. Mild left eccentric disc bulge and facet hypertrophy at L2-3 and L3-4 with resultant mild left L2 and L3 foraminal stenosis, similar to prior. Electronically Signed   By: Jeannine Boga M.D.   On: 12/19/2019 04:01   DG Knee Complete 4 Views Left  Result Date: 12/19/2019 CLINICAL DATA:  Recent fall with left knee pain, initial encounter EXAM: LEFT KNEE - COMPLETE 4+ VIEW COMPARISON:  None. FINDINGS: Mild medial joint space narrowing is noted. No acute fracture or dislocation is seen. No soft tissue abnormality is noted. IMPRESSION: Mild degenerative change without acute abnormality. Electronically Signed   By: Inez Catalina M.D.   On: 12/19/2019 01:36   DG Knee Complete 4 Views Right  Result Date: 12/19/2019 CLINICAL DATA:  Recent fall with right knee pain, initial encounter EXAM: RIGHT KNEE - COMPLETE 4+ VIEW COMPARISON:  None. FINDINGS: Mild medial joint space narrowing is noted. No acute fracture or dislocation is seen. No soft tissue abnormality is noted. IMPRESSION: Mild medial joint space narrowing.  No acute abnormality noted. Electronically Signed   By: Inez Catalina M.D.   On: 12/19/2019 01:34   ECHOCARDIOGRAM COMPLETE  Result Date: 12/19/2019    ECHOCARDIOGRAM REPORT   Patient Name:   [name] Date of Exam: 12/19/2019 Medical Rec #:  [id number]       Height:       70.5 in Accession #:    [id number]      Weight:       247.0 lb Date of Birth:  09/13/1950        BSA:          2.295 m Patient Age:    64 years        BP:           121/74 mmHg Patient Gender: M               HR:           81 bpm. Exam  Location:  Inpatient Procedure: 2D Echo, Cardiac Doppler and Color Doppler Indications:    R07.9* Chest pain, unspecified  History:        Patient has prior history of Echocardiogram examinations, most                 recent 11/22/2017. CAD, Aortic Valve Disease, Signs/Symptoms:Chest                 Pain and Fever; Risk Factors:Hypertension, Diabetes, Sleep Apnea                 and Dyslipidemia. Cancer. Ascending aorta dilation.  Sonographer:    Roseanna Rainbow RDCS Referring Phys: [id number] [facility]  Sonographer Comments: Technically difficult study due to poor echo windows and patient is morbidly obese. Image acquisition challenging due to patient body habitus. IMPRESSIONS  1. There is no significant left ventricular outflow tract gradient. Left ventricular ejection fraction, by estimation, is >75%. The left ventricle has hyperdynamic function. The left ventricle has no regional wall motion abnormalities. There is severe concentric left ventricular hypertrophy. Left ventricular diastolic parameters are consistent with Grade I diastolic dysfunction (impaired relaxation).  2. Right ventricular systolic function is normal. The  right ventricular size is normal. There is normal pulmonary artery systolic pressure.  3. There is a calcified nodule on anterior leaflet basilar surface. The mitral valve is degenerative. No evidence of mitral valve regurgitation. No evidence of mitral stenosis.  4. The aortic valve is tricuspid. Aortic valve regurgitation is trivial. Mild to moderate aortic valve sclerosis/calcification is present, without any evidence of aortic stenosis.  5. Aortic dilatation noted. 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  6. The inferior vena cava is normal in size with greater than 50% respiratory variability, suggesting right atrial pressure of 3 mmHg. FINDINGS  Left Ventricle: There is no significant left ventricular outflow tract gradient. Left ventricular ejection fraction,  by estimation, is >75%. The left ventricle has hyperdynamic function. The left ventricle has no regional wall motion abnormalities. The left ventricular internal cavity size was normal in size. There is severe concentric left ventricular hypertrophy. Left ventricular diastolic parameters are consistent with Grade I diastolic dysfunction (impaired relaxation). Right Ventricle: The right ventricular size is normal. No increase in right ventricular wall thickness. Right ventricular systolic function is normal. There is normal pulmonary artery systolic pressure. The tricuspid regurgitant velocity is 2.35 m/s, and  with an assumed right atrial pressure of 3 mmHg, the estimated right ventricular systolic pressure is 40.9 mmHg. Left Atrium: Left atrial size was normal in size. Right Atrium: Right atrial size was normal in size. Pericardium: There is no evidence of pericardial effusion. Mitral Valve: There is a calcified nodule on anterior leaflet basilar surface. The mitral valve is degenerative in appearance. Normal mobility of the mitral valve leaflets. Mild to moderate mitral annular calcification. No evidence of mitral valve regurgitation. No evidence of mitral valve stenosis. Tricuspid Valve: The tricuspid valve is normal in structure. Tricuspid valve regurgitation is not demonstrated. No evidence of tricuspid stenosis. Aortic Valve: The aortic valve is tricuspid. . There is moderate thickening and moderate calcification of the aortic valve. Aortic valve regurgitation is trivial. Mild to moderate aortic valve sclerosis/calcification is present, without any evidence of aortic stenosis. There is moderate thickening of the aortic valve. There is moderate calcification of the aortic valve. Pulmonic Valve: The pulmonic valve was normal in structure. Pulmonic valve regurgitation is not visualized. No evidence of pulmonic stenosis. Aorta: Aortic dilatation noted. There is moderate dilatation of the ascending aorta measuring  48 mm. Venous: The inferior vena cava is normal in size with greater than 50% respiratory variability, suggesting right atrial pressure of 3 mmHg. IAS/Shunts: No atrial level shunt detected by color flow Doppler.  LEFT VENTRICLE PLAX 2D LVIDd:         3.20 cm     Diastology LVIDs:         2.10 cm     LV e' lateral:   6.42 cm/s LV PW:         1.60 cm     LV E/e' lateral: 8.6 LV IVS:        1.90 cm     LV e' medial:    8.27 cm/s LVOT diam:     1.80 cm     LV E/e' medial:  6.7 LV SV:         41 LV SV Index:   18 LVOT Area:     2.54 cm  LV Volumes (MOD) LV vol d, MOD A4C: 40.5 ml LV vol s, MOD A4C: 8.8 ml LV SV MOD A4C:     40.5 ml RIGHT VENTRICLE RV S prime:  9.03 cm/s TAPSE (M-mode): 1.3 cm LEFT ATRIUM             Index       RIGHT ATRIUM           Index LA diam:        2.40 cm 1.05 cm/m  RA Area:     10.10 cm LA Vol (A2C):   21.9 ml 9.54 ml/m  RA Volume:   19.90 ml  8.67 ml/m LA Vol (A4C):   26.1 ml 11.37 ml/m LA Biplane Vol: 23.9 ml 10.41 ml/m  AORTIC VALVE LVOT Vmax:   97.30 cm/s LVOT Vmean:  72.100 cm/s LVOT VTI:    0.162 m  AORTA Ao Root diam: 3.50 cm MITRAL VALVE               TRICUSPID VALVE MV Area (PHT): 5.38 cm    TR Peak grad:   22.1 mmHg MV Decel Time: 141 msec    TR Vmax:        235.00 cm/s MV E velocity: 55.30 cm/s MV A velocity: 97.70 cm/s  SHUNTS MV E/A ratio:  0.57        Systemic VTI:  0.16 m                            Systemic Diam: 1.80 cm Candee Furbish MD Electronically signed by Candee Furbish MD Signature Date/Time: 12/19/2019/4:07:49 PM    Final      Labs:   Basic Metabolic Panel: Recent Labs  Lab 12/18/19 1325 12/18/19 1325 12/20/19 0114 12/21/19 0207  NA 139  --  141 140  K 3.4*   < > 3.3* 3.6  CL 99  --  101 104  CO2 29  --  28 27  GLUCOSE 131*  --  109* 142*  BUN 21  --  17 15  CREATININE 1.63*  --  1.43* 1.37*  CALCIUM 9.4  --  9.4 9.3   < > = values in this interval not displayed.   GFR Estimated Creatinine Clearance: 60.1 mL/min (A) (by C-G formula based on  SCr of 1.37 mg/dL (H)). Liver Function Tests: No results for input(s): AST, ALT, ALKPHOS, BILITOT, PROT, ALBUMIN in the last 168 hours. No results for input(s): LIPASE, AMYLASE in the last 168 hours. No results for input(s): AMMONIA in the last 168 hours. Coagulation profile No results for input(s): INR, PROTIME in the last 168 hours.  CBC: Recent Labs  Lab 12/18/19 1325 12/20/19 0114 12/21/19 0207  WBC 3.6* 2.8* 3.5*  HGB 11.7* 11.9* 12.2*  HCT 36.8* 37.3* 37.2*  MCV 87.8 86.1 86.5  PLT 200 197 186   Cardiac Enzymes: No results for input(s): CKTOTAL, CKMB, CKMBINDEX, TROPONINI in the last 168 hours. BNP: Invalid input(s): POCBNP CBG: Recent Labs  Lab 12/21/19 1628 12/21/19 1742 12/21/19 2128 12/22/19 0612 12/22/19 0646  GLUCAP 108* 134* 128* 61* 103*   D-Dimer No results for input(s): DDIMER in the last 72 hours. Hgb A1c No results for input(s): HGBA1C in the last 72 hours. Lipid Profile No results for input(s): CHOL, HDL, LDLCALC, TRIG, CHOLHDL, LDLDIRECT in the last 72 hours. Thyroid function studies Recent Labs    12/21/19 0207  TSH 2.015   Anemia work up Recent Labs    12/19/19 1630  TIBC 280  IRON 45   Microbiology Recent Results (from the past 240 hour(s))  SARS Coronavirus 2 by RT PCR (hospital order, performed in [facility] hospital lab) Nasopharyngeal Nasopharyngeal Swab     Status: None   Collection Time: 12/19/19  5:58 AM   Specimen: Nasopharyngeal Swab  Result Value Ref Range Status   SARS Coronavirus 2 NEGATIVE NEGATIVE Final    Comment: (NOTE) SARS-CoV-2 target nucleic acids are NOT DETECTED.  The SARS-CoV-2 RNA is generally detectable in upper and lower respiratory specimens during the acute phase of infection. The lowest concentration of SARS-CoV-2 viral copies this assay can detect is 250 copies / mL. A negative result does not preclude SARS-CoV-2 infection and should not be used as the sole basis for treatment or other patient  management decisions.  A negative result may occur with improper specimen collection / handling, submission of specimen other than nasopharyngeal swab, presence of viral mutation(s) within the areas targeted by this assay, and inadequate number of viral copies (<250 copies / mL). A negative result must be combined with clinical observations, patient history, and epidemiological information.  Fact Sheet for Patients:   StrictlyIdeas.no  Fact Sheet for Healthcare Providers: BankingDealers.co.za  This test is not yet approved or  cleared by the [place] FDA and has been authorized for detection and/or diagnosis of SARS-CoV-2 by FDA under an Emergency Use Authorization (EUA).  This EUA will remain in effect (meaning this test can be used) for the duration of the COVID-19 declaration under Section 564(b)(1) of the Act, 21 U.S.C. section 360bbb-3(b)(1), unless the authorization is terminated or revoked sooner.  Performed at [facility], [place] [address], [place], [place] [address]   MRSA PCR Screening     Status: None   Collection Time: 12/19/19  4:15 PM   Specimen: Nasal Mucosa; Nasopharyngeal  Result Value Ref Range Status   MRSA by PCR NEGATIVE NEGATIVE Final    Comment:        The GeneXpert MRSA Assay (FDA approved for NASAL specimens only), is one component of a comprehensive MRSA colonization surveillance program. It is not intended to diagnose MRSA infection nor to guide or monitor treatment for MRSA infections. Performed at [facility], [place] [address], [place], [place] [address]      Discharge Instructions:   Discharge Instructions    Diet - low sodium heart healthy   Complete by: As directed    Diet Carb Modified   Complete by: As directed    Discharge instructions   Complete by: As directed    Will need follow up with PCP to further investigate cortisol Wear compression stocking Get up slowly    Increase activity slowly   Complete by: As directed      Allergies as of 12/22/2019   No Known Allergies     Medication List    STOP taking these medications   amLODipine 10 MG tablet Commonly known as: NORVASC     TAKE these medications   Accu-Chek Aviva Plus test strip Generic drug: glucose blood USE TO TEST BLOOD SUGAR 3 TIMES A DAY   acetaminophen 500 MG tablet Commonly known as: TYLENOL Take 1,000 mg by mouth as needed for mild pain or headache.   allopurinol 100 MG tablet Commonly known as: ZYLOPRIM Take 200 mg by mouth daily.   aspirin 81 MG tablet Take 81 mg by mouth at bedtime.   atorvastatin 40 MG tablet Commonly known as: LIPITOR Take 40 mg by mouth daily.   clopidogrel 75 MG tablet Commonly known as: PLAVIX Take 75 mg by mouth daily.   diazepam 5 MG tablet Commonly known  as: VALIUM Take 1 tablet (5 mg total) by mouth at bedtime. What changed:   when to take this  reasons to take this   diclofenac Sodium 1 % Gel Commonly known as: VOLTAREN Apply 2 g topically 4 (four) times daily.   furosemide 40 MG tablet Commonly known as: LASIX Take 1 tablet (40 mg total) by mouth daily as needed for fluid. What changed:   when to take this  reasons to take this   gabapentin 300 MG capsule Commonly known as: NEURONTIN Take 2 capsules in morning, 1 capsule in afternoon, and 2 capsules at night. What changed:   how much to take  how to take this  when to take this  additional instructions   insulin aspart protamine- aspart (70-30) 100 UNIT/ML injection Commonly known as: NOVOLOG MIX 70/30 Inject 75 Units into the skin 2 (two) times daily with a meal.   Insulin Syringe-Needle U-100 31G X 5/16" 1 ML Misc Commonly known as: B-D INS SYR ULTRAFINE 1CC/31G Used to give insulin injections twice daily.   lidocaine 5 % Commonly known as: LIDODERM Place 1 patch onto the skin daily. Remove & Discard patch within 12 hours or as directed by MD    Magnesium 200 MG Tabs Take 200 mg by mouth every evening.   mycophenolate 180 MG EC tablet Commonly known as: MYFORTIC Take 180-360 mg by mouth See admin instructions. Take 2 tablets (360mg ) by mouth in the morning and 1 tablet (180mg ) by mouth at night.   nitroGLYCERIN 0.4 MG SL tablet Commonly known as: NITROSTAT PLACE 1 TABLET (0.4 MG TOTAL) UNDER THE TONGUE EVERY 5 (FIVE) MINUTES AS NEEDED FOR CHEST PAIN.   nortriptyline 50 MG capsule Commonly known as: PAMELOR Take 2 capsules (100 mg total) by mouth at bedtime.   omeprazole 20 MG capsule Commonly known as: PRILOSEC Take 1 capsule (20 mg total) by mouth 2 (two) times daily before a meal.   Ozempic (0.25 or 0.5 MG/DOSE) 2 MG/1.5ML Sopn Generic drug: Semaglutide(0.25 or 0.5MG /DOS) Inject 1 mg into the skin once a week.   tacrolimus 5 MG capsule Commonly known as: PROGRAF Take 5 mg by mouth 2 (two) times daily.   Vitamin D (Ergocalciferol) 1.25 MG (50000 UNIT) Caps capsule Commonly known as: DRISDOL Take 50,000 Units by mouth 2 (two) times a week.       Follow-up Information    Marybelle Killings, MD Follow up in 3 week(s).   Specialty: Orthopedic Surgery Contact information: [place] [address] [phone]        [facility] Follow up.   Specialty: Rehabilitation Why: for outpatient physical therapy, they will contact you within 2-3 days if you do not hear from them please call them. Contact information: [address] [phone] mc [place]       [name], MD Follow up in 1 week(s).   Specialty: Family Medicine Why: for labs, BP check and monitor fluid status for need to change lasix Contact information: [place] [address] [phone]        Lelon Perla, MD .   Specialty: Cardiology Contact information: [address] [place]  [address] [phone]                Time coordinating discharge: 35 min  Signed:  Geradine Girt DO  [facility] 12/22/2019, 10:35 AM

## 2019-12-22 NOTE — Progress Notes (Addendum)
Spoke with April Cooper, staff RN in regards to patient's question of loss of Dexcom sensor in ED. Our staff does not have Dexcom sensors and cannot provide them.  Harvel Ricks RN BSN CDE Diabetes Coordinator Pager: [phone]  8am-5pm

## 2019-12-22 NOTE — Progress Notes (Signed)
Patient supposed to get cosyntropin at 0530. Lab has not been on the floor to draw blood for ACTH stimulation. Called the lab twice, no one answered. On third call to lab, they said they are short staffed and will be on the floor as soon as possible. Will give the cosyntropin as soon as the lab draws cortisol level. Will pass it on to day shift RN too.

## 2019-12-22 NOTE — Plan of Care (Signed)

## 2019-12-22 NOTE — Progress Notes (Signed)
Hypoglycemic Event  CBG: 61  Treatment: Half cup orange juice  Symptoms:Asymptomatic  Follow-up CBG: Time:0646 CBG Result:103  Possible Reasons for Event: poor appetite  Comments/MD notified: patient asymptomatic. Will monitor the patient    Warren Danes

## 2019-12-22 NOTE — Progress Notes (Signed)
Lab here to draw blood. Cosyntropin given after lab drew the 1st level.

## 2019-12-22 NOTE — Discharge Instructions (Signed)
CAN ALSO GET Lidocaine patches OTC WEAR COMPRESSION HOSE DAILY-- easier to put on prior to getting out of bed

## 2019-12-28 DIAGNOSIS — E118 Type 2 diabetes mellitus with unspecified complications: Secondary | ICD-10-CM | POA: Diagnosis not present

## 2019-12-29 ENCOUNTER — Telehealth: Payer: Self-pay | Admitting: Family Medicine

## 2019-12-29 NOTE — Telephone Encounter (Signed)
Patient states he received a phone call doesn't know what it was about or who called him.

## 2019-12-29 NOTE — Progress Notes (Signed)
Phone [phone]   Subjective:  [name] is a 69 y.o. year old very pleasant male patient who presents for transitional  hospital follow up for difficulty walking. Patient was hospitalized from 12/18/19 to 12/22/19.   See problem oriented charting as well  Past Medical History-  Patient Active Problem List   Diagnosis Date Noted  . Malignant neoplasm of prostate ([place]) 06/09/2019    Priority: High  . PAD (peripheral artery disease) ([place]) 01/16/2019    Priority: High  . Morbid obesity ([place]) 12/04/2017    Priority: High  . Mild cognitive impairment 06/18/2016    Priority: High  . 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 Renal transplant recipient 06/27/2015    Priority: High  . Immunosuppression ([place]) 06/27/2015    Priority: High  . Ascending aorta dilatation-4.7 cm per [facility] 2014 10/23/2014    Priority: High  . Diabetic neuropathy ([place]) 10/14/2010    Priority: High  . CAD -s/p DES to marginal vessel at [facility] 04/09/2009    Priority: High  . 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 Protrusion of lumbar intervertebral disc     Priority: Medium  . Spinal stenosis of lumbar region 09/21/2018    Priority: Medium  . Hyperlipidemia 01/16/2016    Priority: Medium  . Gout 12/22/2009    Priority: Medium  . Obstructive sleep apnea 07/10/2009    Priority: Medium  . BPH (benign prostatic hyperplasia) 09/25/2008    Priority: Medium  . Low back pain 12/15/2007    Priority: Medium  . Posttraumatic stress disorder 11/03/2007    Priority: Medium  . Essential hypertension 01/17/2007    Priority: Medium  . History of stroke 01/17/2007    Priority: Medium  . GERD (gastroesophageal reflux disease) 05/16/2014    Priority: Low  . Trigger thumb of right hand 01/03/2014    Priority: Low  . Left foot drop 05/02/2013     Priority: Low  . ED (erectile dysfunction) 04/03/2013    Priority: Low  . Risk for falls 01/22/2012    Priority: Low  . Gastroparesis 06/19/2008    Priority: Low  . Allergic rhinitis 03/01/2007    Priority: Low  . H/O iron deficiency anemia 02/10/2012    Medications- reviewed and updated  A medical reconciliation was performed comparing current medicines to hospital discharge medications. Current Outpatient Medications  Medication Sig Dispense Refill  . acetaminophen (TYLENOL) 500 MG tablet Take 1,000 mg by mouth as needed for mild pain or headache.     . allopurinol (ZYLOPRIM) 100 MG tablet Take 200 mg by mouth daily.     [name] aspirin 81 MG tablet Take 81 mg by mouth at bedtime.     [name] atorvastatin (LIPITOR) 40 MG tablet Take 40 mg by mouth daily.    . clopidogrel (PLAVIX) 75 MG tablet Take 75 mg by mouth daily.     . diazepam (VALIUM) 5 MG tablet Take 1 tablet (5 mg total) by mouth at bedtime. (Patient taking differently: Take 5 mg by mouth every 8 (eight) hours as needed for muscle spasms. ) 30 tablet 0  . diclofenac Sodium (VOLTAREN) 1 % GEL Apply 2 g topically 4 (four) times daily. 50 g 0  . furosemide (LASIX) 40 MG tablet Take 1 tablet (40 mg total) by mouth daily as needed for fluid. 30 tablet   . gabapentin (NEURONTIN) 300  MG capsule Take 2 capsules in morning, 1 capsule in afternoon, and 2 capsules at night. (Patient taking differently: Take 300 mg by mouth See admin instructions. Take 2 capsules (600mg ) by mouth in the morning, take 1 capsule (300mg ) by mouth midday, and take 2 capsules (600mg ) by mouth at night.) 150 capsule 5  . glucose blood (ACCU-CHEK AVIVA PLUS) test strip USE TO TEST BLOOD SUGAR 3 TIMES A DAY 300 strip 1  . insulin aspart protamine- aspart (NOVOLOG MIX 70/30) (70-30) 100 UNIT/ML injection Inject 75 Units into the skin 2 (two) times daily with a meal.     . Insulin Syringe-Needle U-100 (B-D INS SYR ULTRAFINE 1CC/31G) 31G X 5/16" 1 ML MISC Used to give insulin  injections twice daily. 100 each 11  . lidocaine (LIDODERM) 5 % Place 1 patch onto the skin daily. Remove & Discard patch within 12 hours or as directed by MD 30 patch 0  . Magnesium 200 MG TABS Take 200 mg by mouth every evening.     . mycophenolate (MYFORTIC) 180 MG EC tablet Take 180-360 mg by mouth See admin instructions. Take 2 tablets (360mg ) by mouth in the morning and 1 tablet (180mg ) by mouth at night.    . nitroGLYCERIN (NITROSTAT) 0.4 MG SL tablet PLACE 1 TABLET (0.4 MG TOTAL) UNDER THE TONGUE EVERY 5 (FIVE) MINUTES AS NEEDED FOR CHEST PAIN. 25 tablet 2  . nortriptyline (PAMELOR) 50 MG capsule TAKE 2 CAPSULES (100 MG TOTAL) BY MOUTH AT BEDTIME. 180 capsule 2  . omeprazole (PRILOSEC) 20 MG capsule Take 1 capsule (20 mg total) by mouth 2 (two) times daily before a meal. 180 capsule 3  . Semaglutide,0.25 or 0.5MG /DOS, (OZEMPIC, 0.25 OR 0.5 MG/DOSE,) 2 MG/1.5ML SOPN Inject 1 mg into the skin once a week.     . tacrolimus (PROGRAF) 5 MG capsule Take 5 mg by mouth 2 (two) times daily.     . Vitamin D, Ergocalciferol, (DRISDOL) 1.25 MG (50000 UNIT) CAPS capsule Take 50,000 Units by mouth 2 (two) times a week.     No current facility-administered medications for this visit.   Objective  Objective:  BP 114/68   Pulse 97   Temp 98.2 F (36.8 C)   Ht 5\' 10"  (1.778 m)   Wt 234 lb 12.8 oz (106.5 kg)   SpO2 99%   BMI 33.69 kg/m  Gen: NAD, some disappointment from recent divorce CV: RRR no murmurs rubs or gallops Lungs: CTAB no crackles, wheeze, rhonchi Abdomen: soft/nontender/nondistended/normal bowel sounds. No rebound or guarding.  Ext: trace edema Skin: warm, dry Neuro: Antalgic gait, walks with walker     Assessment and Plan:   #Hospital follow-up S: 69 year old male renal transplant recipient also with history of coronary artery disease and CHF who presented to the hospital after fall.  Patient reported chronic back pain and lower extremity weakness-he follows with Dr. Lorin Mercy as  outpatient orthopedics and has undergone epidural injections almost yearly with some relief.  Also follows with neurology for neuropathy/tremors-gabapentin dosage has been tapered down recently due to intermittent myoclonus (lyrica worsens his ptsde)and amitriptyline have been increased to help with pain/tremors.  Patient had worsening sciatic pain on the left side and weakness requiring him to use a walker consistently instead of just on bad days.  On the day of admission he was try to restart rack of clothes in his closet and felt somewhat lightheaded when he stood back up resulting in a fall where he landed on his knees and  then on his side.  Thankfully no loss of consciousness.  He was complaining of left-sided chest pain after the fall as well as neck pain.  Denies neck trauma but did feel some neck pain after the fall.  Steroids were avoided due to his diabetes and overall poor control.  He was discharged on gabapentin 300 mg 3 times daily and amitriptyline as per neurology.  MRI of lumbar spine showed 6 severe degenerative disc disease areas/foraminal stenosis similar to prior imaging.  Patient also had atypical chest pain as above-thought to be musculoskeletal.  EKG was unremarkable.  Troponin levels were flat  For orthostatic hypotension-Norvasc was held.  Some concern for adrenal suppression with initial cortisol level being low but stimulation test was normal.  Recommended to use compression stockings  For diabetes-patient was resumed on 70/30 insulin 50 units twice daily as well as Ozempic weekly.  Recommended diabetic diet.  Patient did have leukopenia/anemia-thought related to prostate cancer or prior radiation treatments.  Leukopenia was largely stable.  Hemoglobin down slightly to 11.7 from baseline around 13.  Patient is on aspirin and Plavix chronically with history of CVA but no blood in the stools noted  Today, patient states he continues to have fatigue/weakness. Moving to [place]  tomorrow- getting everything ready today- a lot to manage right now. Leg weakness for months he states is what caused the fall. He does not feel stable on his legs- particularly the left knee.   A/P:   Low back pain Leg weakness likely related to spinal stenosis/sciatica vs neuropathy/low back pain-leading to fall and hospitalization -remains on gabapentin and finds helpful - valium at night- warned about leg weakness potentially. He may stop -nortriptyline seems to be helping some with symptoms -I encouraged follow upw ith Dr. Lorin Mercy #Weakness also probably related to prior stroke history-remains on Plavix and aspirin for that  CAD -s/p DES to marginal vessel at [facility] 2014 Atypical chest pain-low risk troponin pattern in hospital postop musculoskeletal.  No further work-up required.  Patient does have cardiologist if needed -continue aspirin, plavix, lipitor for CAD prevention   If recurrent symptoms recommend cardiology follow-up  Essential hypertension For hypertension-thankfully blood pressure remains controlled despite being off Norvasc-continue to monitor. 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 Reports a1c below 7. We will check a1c today as he was not sure when his last one was- can forward to [place].   Diabetic neuropathy ([place]) Patient on nortriptyline 50 mg as well as gabapentin-helps somewhat at least with the weakness, neuropathic pain.  Continue current medication and neurology follow-up   For leukopenia/anemia-during hospitalization-we will update CBC/CMP today  Patient also reports significant fatigue-also check TSH and labs  Immunosuppression ([place]), Chronic-for renal transplant recipient.  Remains on  tacrolimus and Myfortic.  Continue current medication  Recommended follow up: 28-month follow-up recommended  Lab/Order associations:   ICD-10-CM   1. Fatigue, unspecified type  R53.83 TSH(Reflex)    COMPLETE METABOLIC PANEL WITH GFR    CBC With Differential/Platelet    CBC With Differential/Platelet    COMPLETE METABOLIC PANEL WITH GFR    TSH(Reflex)    CANCELED: CBC With Differential/Platelet    CANCELED: COMPLETE METABOLIC PANEL WITH GFR    CANCELED: TSH(Reflex)  2. Diabetes mellitus due  to underlying condition with diabetic autonomic neuropathy, with long-term current use of insulin (HCC)  E08.43 Lipid Panel (Refl)   Z79.4 Hemoglobin A1c    COMPLETE METABOLIC PANEL WITH GFR    CBC With Differential/Platelet    CBC With Differential/Platelet    COMPLETE METABOLIC PANEL WITH GFR    Hemoglobin A1c    Lipid Panel (Refl)    CANCELED: CBC With Differential/Platelet    CANCELED: COMPLETE METABOLIC PANEL WITH GFR    CANCELED: Hemoglobin A1c    CANCELED: Lipid Panel (Refl)  3. Chronic low back pain with bilateral sciatica, unspecified back pain laterality  M54.41    G89.29    M54.42   4. Diabetic polyneuropathy associated with type 2 diabetes mellitus (HCC)  E11.42   5. Immunosuppression (HCC) Chronic D84.9   6. Morbid obesity ([place]) Chronic E66.01   7. Spinal stenosis of lumbar region, unspecified whether neurogenic claudication present  M48.061   8. Essential hypertension  I10   9. Coronary artery disease involving native coronary artery without angina pectoris, unspecified whether native or transplanted heart  I25.10   10. Orthostatic hypotension  I95.1    Time Spent: 41 minutes of total time (1 PM-1:29 PM, 5:31 PM-5:43 PM) was spent on the date of the encounter performing the following actions: chart review prior to seeing the patient, obtaining history, performing a medically necessary exam, counseling on the treatment plan, placing orders, and documenting in our EHR.   Return  precautions advised.  Garret Reddish, MD

## 2019-12-29 NOTE — Patient Instructions (Addendum)
Health Maintenance Due  Topic Date Due  . TETANUS/TDAP -will get at Dr. Clare Gandy clinic. May be cheaper to get at pharmacy Never done  . COLONOSCOPY -reports scheduled with [facility]- I do not see scheduled- please double check on this Never done  . PNA vac Low Risk Adult (2 of 2 - PPSV23)-will get at Dr. Ollen Barges clinic. May be cheaper to get at pharmacy 12/04/2018  . OPHTHALMOLOGY EXAM -has done at the [place] (have records sent to [place]). Sign release of information at the check out desk for these.  06/25/2019  . INFLUENZA VACCINE -let us know when you have gotten this  12/24/2019   Please stop by lab before you go If you have mychart- we will send your results within 3 business days of [place] receiving them.  If you do not have mychart- we will call you about results within 5 business days of [place] receiving them.  *please note we are currently using [facility] which has a longer processing time than [facility] typically so labs may not come back as quickly as in the past *please also note that you will see labs on mychart as soon as they post. I will later go in and write notes on them- will say "notes from Dr. Yong Channel"  Check labs for fatigue  For constipation- start with colace up to twice daily. If that's not effective can try half or full capful of miralax with a glass of water- usually produces bowel movements in 1-2 days

## 2020-01-01 ENCOUNTER — Other Ambulatory Visit: Payer: Self-pay | Admitting: Neurology

## 2020-01-02 ENCOUNTER — Other Ambulatory Visit: Payer: Self-pay

## 2020-01-02 ENCOUNTER — Encounter: Payer: Self-pay | Admitting: Family Medicine

## 2020-01-02 ENCOUNTER — Ambulatory Visit (INDEPENDENT_AMBULATORY_CARE_PROVIDER_SITE_OTHER): Payer: Medicare Other | Admitting: Family Medicine

## 2020-01-02 VITALS — BP 114/68 | HR 97 | Temp 98.2°F | Ht 70.0 in | Wt 234.8 lb

## 2020-01-02 DIAGNOSIS — G8929 Other chronic pain: Secondary | ICD-10-CM

## 2020-01-02 DIAGNOSIS — M5441 Lumbago with sciatica, right side: Secondary | ICD-10-CM | POA: Diagnosis not present

## 2020-01-02 DIAGNOSIS — D849 Immunodeficiency, unspecified: Secondary | ICD-10-CM | POA: Diagnosis not present

## 2020-01-02 DIAGNOSIS — R5383 Other fatigue: Secondary | ICD-10-CM | POA: Diagnosis not present

## 2020-01-02 DIAGNOSIS — E0843 Diabetes mellitus due to underlying condition with diabetic autonomic (poly)neuropathy: Secondary | ICD-10-CM | POA: Diagnosis not present

## 2020-01-02 DIAGNOSIS — M5442 Lumbago with sciatica, left side: Secondary | ICD-10-CM

## 2020-01-02 DIAGNOSIS — E1142 Type 2 diabetes mellitus with diabetic polyneuropathy: Secondary | ICD-10-CM

## 2020-01-02 DIAGNOSIS — I951 Orthostatic hypotension: Secondary | ICD-10-CM

## 2020-01-02 DIAGNOSIS — I10 Essential (primary) hypertension: Secondary | ICD-10-CM

## 2020-01-02 DIAGNOSIS — M48061 Spinal stenosis, lumbar region without neurogenic claudication: Secondary | ICD-10-CM

## 2020-01-02 DIAGNOSIS — I2510 Atherosclerotic heart disease of native coronary artery without angina pectoris: Secondary | ICD-10-CM

## 2020-01-02 DIAGNOSIS — Z794 Long term (current) use of insulin: Secondary | ICD-10-CM | POA: Diagnosis not present

## 2020-01-02 NOTE — Assessment & Plan Note (Addendum)
Atypical chest pain-low risk troponin pattern in hospital postop musculoskeletal.  No further work-up required.  Patient does have cardiologist if needed -continue aspirin, plavix, lipitor for CAD prevention   If recurrent symptoms recommend cardiology follow-up

## 2020-01-02 NOTE — Assessment & Plan Note (Signed)
For hypertension-thankfully blood pressure remains controlled despite being off Norvasc-continue to monitor. On lasix twice daily now he reports and BP looks great  BP Readings from Last 3 Encounters:  01/02/20 114/68  12/22/19 120/84  12/08/19 132/73

## 2020-01-02 NOTE — Assessment & Plan Note (Signed)
For diabetes-currently managed at [facility] medical . Reports a1c below 7. We will check a1c today as he was not sure when his last one was- can forward to [place].

## 2020-01-02 NOTE — Assessment & Plan Note (Addendum)
Leg weakness likely related to spinal stenosis/sciatica vs neuropathy/low back pain-leading to fall and hospitalization -remains on gabapentin and finds helpful - valium at night- warned about leg weakness potentially. He may stop -nortriptyline seems to be helping some with symptoms -I encouraged follow upw ith Dr. Lorin Mercy #Weakness also probably related to prior stroke history-remains on Plavix and aspirin for that

## 2020-01-02 NOTE — Assessment & Plan Note (Signed)
Patient on nortriptyline 50 mg as well as gabapentin-helps somewhat at least with the weakness, neuropathic pain.  Continue current medication and neurology follow-up

## 2020-01-03 ENCOUNTER — Telehealth: Payer: Self-pay | Admitting: Family Medicine

## 2020-01-03 LAB — COMPLETE METABOLIC PANEL WITH GFR
AG Ratio: 1.5 (calc) (ref 1.0–2.5)
ALT: 13 U/L (ref 9–46)
AST: 13 U/L (ref 10–35)
Albumin: 3.9 g/dL (ref 3.6–5.1)
Alkaline phosphatase (APISO): 87 U/L (ref 35–144)
BUN/Creatinine Ratio: 12 (calc) (ref 6–22)
BUN: 22 mg/dL (ref 7–25)
CO2: 34 mmol/L — ABNORMAL HIGH (ref 20–32)
Calcium: 9.8 mg/dL (ref 8.6–10.3)
Chloride: 99 mmol/L (ref 98–110)
Creat: 1.8 mg/dL — ABNORMAL HIGH (ref 0.70–1.18)
GFR, Est African American: 43 mL/min/{1.73_m2} — ABNORMAL LOW (ref >=60)
GFR, Est Non African American: 37 mL/min/{1.73_m2} — ABNORMAL LOW (ref >=60)
Globulin: 2.6 g/dL (calc) (ref 1.9–3.7)
Glucose, Bld: 158 mg/dL — ABNORMAL HIGH (ref 65–99)
Potassium: 4 mmol/L (ref 3.5–5.3)
Sodium: 139 mmol/L (ref 135–146)
Total Bilirubin: 0.6 mg/dL (ref 0.2–1.2)
Total Protein: 6.5 g/dL (ref 6.1–8.1)

## 2020-01-03 LAB — CBC WITH DIFFERENTIAL/PLATELET
Absolute Monocytes: 320 cells/uL (ref 200–950)
Basophils Absolute: 0 cells/uL (ref 0–200)
Basophils Relative: 0 %
Eosinophils Absolute: 59 cells/uL (ref 15–500)
Eosinophils Relative: 1.5 %
HCT: 36.1 % — ABNORMAL LOW (ref 38.5–50.0)
Hemoglobin: 11.8 g/dL — ABNORMAL LOW (ref 13.2–17.1)
Lymphs Abs: 351 cells/uL — ABNORMAL LOW (ref 850–3900)
MCH: 28.1 pg (ref 27.0–33.0)
MCHC: 32.7 g/dL (ref 32.0–36.0)
MCV: 86 fL (ref 80.0–100.0)
MPV: 9.7 fL (ref 7.5–12.5)
Monocytes Relative: 8.2 %
Neutro Abs: 3171 cells/uL (ref 1500–7800)
Neutrophils Relative %: 81.3 %
Platelets: 213 10*3/uL (ref 140–400)
RBC: 4.2 10*6/uL (ref 4.20–5.80)
RDW: 14.5 % (ref 11.0–15.0)
Total Lymphocyte: 9 %
WBC: 3.9 10*3/uL (ref 3.8–10.8)

## 2020-01-03 LAB — LIPID PANEL (REFL)
Cholesterol: 148 mg/dL (ref <200)
HDL: 47 mg/dL (ref >=40)
LDL Cholesterol (Calc): 72 mg/dL (calc)
Non-HDL Cholesterol (Calc): 101 mg/dL (calc) (ref <130)
Total CHOL/HDL Ratio: 3.1 (calc) (ref <5.0)
Triglycerides: 191 mg/dL — ABNORMAL HIGH (ref <150)

## 2020-01-03 LAB — HEMOGLOBIN A1C
Hgb A1c MFr Bld: 7 % of total Hgb — ABNORMAL HIGH (ref <5.7)
Mean Plasma Glucose: 154 (calc)
eAG (mmol/L): 8.5 (calc)

## 2020-01-03 LAB — TSH(REFL): TSH: 1.97 mIU/L (ref 0.40–4.50)

## 2020-01-03 LAB — REFLEX TIQ

## 2020-01-03 NOTE — Telephone Encounter (Signed)
Pt would like to be called to have a sooner appointment with Dr. Yong Channel. Ok to use SDA ok per [place]   LVM for patient to call back and schedule appt

## 2020-01-09 ENCOUNTER — Inpatient Hospital Stay: Payer: Medicare Other | Admitting: Family Medicine

## 2020-01-10 ENCOUNTER — Other Ambulatory Visit: Payer: Self-pay

## 2020-01-10 ENCOUNTER — Telehealth: Payer: Self-pay | Admitting: Family Medicine

## 2020-01-10 MED ORDER — "INSULIN SYRINGE-NEEDLE U-100 31G X 5/16"" 1 ML MISC": 11 refills | Status: DC

## 2020-01-10 NOTE — Telephone Encounter (Signed)
Called patient l/m to let know that I have called refill of needles in to local pharmacy

## 2020-01-10 NOTE — Telephone Encounter (Signed)
Chief Complaint Prescription Refill or Medication Request (non symptomatic) Reason for Call Symptomatic / Request for Health Information Initial Comment Caller states he need doctor hunter nurse, he needs prescription for his pen needles. Translation No Disp. Time Eilene Ghazi Time) Disposition Final User 01/09/2020 5:27:49 PM Attempt made - message left Edmonia Lynch RN, [place] 01/09/2020 5:43:25 PM FINAL ATTEMPT MADE - message left Yes Edmonia Lynch, RN, [place]

## 2020-01-11 DIAGNOSIS — R311 Benign essential microscopic hematuria: Secondary | ICD-10-CM | POA: Diagnosis not present

## 2020-01-18 ENCOUNTER — Encounter: Payer: Self-pay | Admitting: Physical Therapy

## 2020-01-18 ENCOUNTER — Other Ambulatory Visit: Payer: Self-pay

## 2020-01-18 ENCOUNTER — Ambulatory Visit: Payer: Medicare Other | Attending: Internal Medicine | Admitting: Physical Therapy

## 2020-01-18 DIAGNOSIS — R2689 Other abnormalities of gait and mobility: Secondary | ICD-10-CM | POA: Diagnosis not present

## 2020-01-18 DIAGNOSIS — M6281 Muscle weakness (generalized): Secondary | ICD-10-CM | POA: Insufficient documentation

## 2020-01-18 DIAGNOSIS — G8929 Other chronic pain: Secondary | ICD-10-CM | POA: Diagnosis not present

## 2020-01-18 DIAGNOSIS — M545 Low back pain, unspecified: Secondary | ICD-10-CM

## 2020-01-18 DIAGNOSIS — R2681 Unsteadiness on feet: Secondary | ICD-10-CM

## 2020-01-18 NOTE — Therapy (Signed)
[place], [place], [address] Phone: [phone]   Fax:  [phone]  Physical Therapy Evaluation  Patient Details  Name: [name] MRN: [id number] Date of Birth: 23-Aug-1950 Referring Provider (PT):  Geradine Girt, DO    Encounter Date: 01/18/2020   PT End of Session - 01/18/20 1549    Visit Number 1    Number of Visits 17    Date for PT Re-Evaluation 03/14/20    Authorization Type MCR: kx mod at 15th visit    Progress Note Due on Visit 10    PT Start Time 4132    PT Stop Time 1632    PT Time Calculation (min) 47 min    Activity Tolerance Treatment limited secondary to agitation    Behavior During Therapy [facility] for tasks assessed/performed           Past Medical History:  Diagnosis Date  . Allergy   . Angina   . Arthritis   . Backache, unspecified   . CHF (congestive heart failure) ([place])   . Chills   . Chronic kidney disease, stage III (moderate)    HD T- TH-SAT  . Complication of anesthesia    01/2011 could not eat,, hospt x2, was placed on hd and cleared up  . Coronary atherosclerosis of native coronary artery   . Diabetes mellitus 2004  . Dizziness   . Dysphagia, unspecified(787.20)   . Fall at home 11/2019  . Gastroparesis   . Headache(784.0)   . Hemorrhage of rectum and anus   . Hyperlipidemia   . Hypertrophy of prostate with urinary obstruction and other lower urinary tract symptoms (LUTS)   . INTERNAL HEMORRHOIDS 10/16/2008   Qualifier: Diagnosis of  By: Nolon Rod CMA (AAMA), Robin    . Internal hemorrhoids without mention of complication   . Myocardial infarction ([place]) 2002  . Nausea alone   . Obesity   . Other dyspnea and respiratory abnormality   . Other malaise and fatigue   . Personal history of unspecified circulatory disease   . Posttraumatic stress disorder   . Prostate cancer ([place])   . Sleep apnea    USES CPAP   . Stroke [facility])    2007  . Unspecified essential  hypertension    hx htn     Past Surgical History:  Procedure Laterality Date  . ANAL FISSURECTOMY    . AV FISTULA PLACEMENT    . CARDIAC CATHETERIZATION     2005 DR BRODIE  . HEMORRHOID SURGERY    . 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 PROSTATE BIOPSY    . revision of fistula     renal failure  . VENTRAL HERNIA REPAIR  07/01/2011   Procedure: HERNIA REPAIR VENTRAL ADULT;  Surgeon: Joyice Faster. Cornett, MD;  Location: [facility];  Service: General;  Laterality: N/A;    There were no vitals filed for this visit.    Subjective Assessment - 01/18/20 1555    Subjective pt is a 69 y.o reporting hx or falling and general weakness and instability has been going on for many years and has progressively worsened. especially on the R compared to the left. He reports hx of falls which he went to ED via EMS at on 7/26 and was admitted for a week, and tha the fell last night. He reports 11-12 falls in the last 6 months. pt repots having N/T in bil feet with the L>R.    Limitations Standing;Walking;Lifting  How long can you sit comfortably? unlimited    How long can you stand comfortably? 20 min with rollator but can fluctuate    How long can you walk comfortably? 10 -15 steps    Patient Stated Goals improve strength in back and hips, stand up with minimal assistance, to walk better.    Currently in Pain? Yes    Pain Score 10-Worst pain ever   pt denied going to the hospital   Pain Location Knee    Pain Orientation Right;Left    Pain Descriptors / Indicators Aching    Pain Type Chronic pain    Pain Onset More than a month ago    Pain Frequency Intermittent    Aggravating Factors  falling, standing/ walking    Pain Relieving Factors sitting/ resting, massage    Effect of Pain on Daily Activities limited standing/ walking    Multiple Pain Sites Yes    Pain Score 7    Pain Location Back    Pain Orientation Right;Left    Pain Descriptors / Indicators Aching;Sore;Tightness    Pain Type Chronic pain    Pain  Onset More than a month ago    Pain Frequency Constant    Aggravating Factors  standing/ walking, bending forward    Pain Relieving Factors sit and rest    Effect of Pain on Daily Activities limited standing/ walking, general mobility              [facility] PT Assessment - 01/18/20 0001      Assessment   Medical Diagnosis general weakness    Referring Provider (PT)  Geradine Girt, DO     Onset Date/Surgical Date --   for many years   Hand Dominance Right    Next MD Visit make on PRN    Prior Therapy yes      Precautions   Precautions None      Restrictions   Weight Bearing Restrictions No      Balance Screen   Has the patient fallen in the past 6 months Yes    How many times? 11    Has the patient had a decrease in activity level because of a fear of falling?  Yes    Is the patient reluctant to leave their home because of a fear of falling?  Yes      [place];Other relatives    Available Help at Discharge Family    Type of [place] to enter    Entrance Stairs-Number of Steps 5    Entrance Stairs-Rails Can reach both    Home Layout Two level    Alternate Level Stairs-Number of Steps pt doesn't go down to bottom level    Home Equipment Wheelchair - manual;Cane - single point;Shower seat - built in   rollator     Prior Function   Level of Independence Independent with basic ADLs;Requires assistive device for independence    Vocation Retired      Associate Professor   Overall Cognitive Status Within Functional Limits for tasks assessed      Observation/Other Assessments   Focus on Therapeutic Outcomes (FOTO)  76% limited    predicted 70% limited     ROM / Strength   AROM / PROM / Strength Strength;AROM      AROM   AROM Assessment Site Hip;Knee    Right/Left Hip Right;Left  Right/Left Knee Left;Right      Strength   Strength Assessment Site Knee;Hip;Ankle     Right/Left Hip Right;Left    Right Hip Flexion 4/5    Right Hip Extension 3+/5    Right Hip ABduction 3/5    Right Hip ADduction 3+/5    Left Hip Flexion 3-/5    Left Hip Extension 3-/5    Left Hip ABduction 3/5    Left Hip ADduction 3+/5    Right/Left Knee Right;Left    Right Knee Flexion 4-/5    Right Knee Extension 4-/5    Left Knee Flexion 3+/5    Left Knee Extension 3+/5    Right/Left Ankle Right;Left    Right Ankle Dorsiflexion 4-/5    Right Ankle Plantar Flexion 4-/5    Right Ankle Inversion 4-/5    Right Ankle Eversion 4-/5    Left Ankle Dorsiflexion 3-/5    Left Ankle Plantar Flexion 3+/5    Left Ankle Inversion 3+/5    Left Ankle Eversion 3+/5      Standardized Balance Assessment   Standardized Balance Assessment Five Times Sit to Stand    Five times sit to stand comments  32 seconds with siginfican plopping with limited control with descent                      Objective measurements completed on examination: See above findings.       [place] Adult PT Treatment/Exercise - 01/18/20 0001      Exercises   Exercises Knee/Hip      Knee/Hip Exercises: Seated   Long Arc Quad 1 set;10 reps;Both;Strengthening    Other Seated Knee/Hip Exercises glute set 1 x 5 holding 10 seconds    Marching 2 sets;10 reps                  PT Education - 01/18/20 1613    Education Details evaluation findings, POC, goals, HEP with proper form/ rationale.    Person(s) Educated Patient    Methods Explanation;Verbal cues;Handout    Comprehension Verbalized understanding;Verbal cues required            PT Short Term Goals - 01/18/20 1645      PT SHORT TERM GOAL #1   Title Pt will be I and compliant with HEP.    Time 4    Period Weeks    Status New    Target Date 02/15/20      PT SHORT TERM GOAL #2   Title pt to be able to perform 5 x sit to stand </= 20 seconds    Time 4    Period Weeks    Status New    Target Date 02/15/20      PT SHORT TERM GOAL  #3   Title to assess berg balance or slimilar balance testing and make goals accordingly.    Time 4    Period Weeks    Status New    Target Date 02/15/20             PT Long Term Goals - 01/18/20 1646      PT LONG TERM GOAL #1   Title increase bil LE strenth to >/= 4/5 to promote hip/ knee stability with walking/ standing    Time 8    Period Weeks    Status New    Target Date 03/14/20      PT LONG TERM GOAL #2   Title increase FOTO to </=  70% limited for funcitonal improvement    Time 8    Target Date 03/14/20      PT LONG TERM GOAL #3   Title decrease 5 x sit to stand to </= 15 seconds for improvement in function    Baseline -    Time 8    Period Weeks    Status New    Target Date 03/14/20      PT LONG TERM GOAL #4   Title pt to report of falls or near falls for >/= 3 weeks for improvement in function    Time 8    Period Weeks    Status New    Target Date 03/14/20      PT LONG TERM GOAL #5   Title pt to be I with all HEP given to maintain and progress current LOF IND    Time 8    Period Weeks    Status New    Target Date 03/14/20                  Plan - 01/18/20 1549    Clinical Impression Statement pt presents to OPPT with DX of general weakness and significant PMHx. He demonstrates weakness in bil LE with L>R, and required CGA for transfers from rollator to table. 5 x sit to stand pt scored 32 seconds exhibit limited eccentric control. pt reports > 11 falls in the last 6 months. He would benefit from physical therapy to decrease low back pain, increase bil LE strength, improve stability / balance and maximize safety and overall function by addressing the deficits listed.    Personal Factors and Comorbidities Comorbidity 3+;Age;Past/Current Experience    Comorbidities hx of PTSD, anxiety, DM, MI    Examination-Activity Limitations Stand;Stairs;Squat;Bend;Lift;Locomotion Level    Stability/Clinical Decision Making Unstable/Unpredictable    Clinical  Decision Making Moderate    Rehab Potential Good    PT Frequency 2x / week    PT Duration 8 weeks    PT Treatment/Interventions ADLs/Self Care Home Management;Cryotherapy;Moist Heat;Gait training;Stair training;Functional mobility training;Therapeutic activities;Therapeutic exercise;Balance training;Neuromuscular re-education;Patient/family education;Manual techniques;Passive range of motion;Dry needling;Taping;Aquatic Therapy    PT Next Visit Plan review/ update HEP PRN, review FOTO and provide handout, gross LE strengthening, balance training, aquatic therapy?    PT Home Exercise Plan [facility] - seated glute set, ab set, seated clamshell, seated marching    Consulted and Agree with Plan of Care Patient           Patient will benefit from skilled therapeutic intervention in order to improve the following deficits and impairments:  Abnormal gait, Improper body mechanics, Decreased activity tolerance, Postural dysfunction, Increased muscle spasms, Decreased endurance, Pain, Decreased balance, Decreased range of motion  Visit Diagnosis: Chronic bilateral low back pain, unspecified whether sciatica present  Muscle weakness (generalized)  Other abnormalities of gait and mobility  Unsteadiness on feet     Problem List Patient Active Problem List   Diagnosis Date Noted  . Protrusion of lumbar intervertebral disc   . Malignant neoplasm of prostate ([place]) 06/09/2019  . PAD (peripheral artery disease) ([place]) 01/16/2019  . Spinal stenosis of lumbar region 09/21/2018  . Morbid obesity ([place]) 12/04/2017  . Mild cognitive impairment 06/18/2016  . Hyperlipidemia 01/16/2016  . Immunosuppressive management encounter following kidney transplant 07/12/2015  . Renal transplant recipient 06/27/2015  . Immunosuppression ([place]) 06/27/2015  . Ascending aorta dilatation-4.7 cm per ECHO [facility] 2014 10/23/2014  . GERD (gastroesophageal reflux disease) 05/16/2014  . Trigger thumb of right  hand 01/03/2014   . Left foot drop 05/02/2013  . ED (erectile dysfunction) 04/03/2013  . H/O iron deficiency anemia 02/10/2012  . Risk for falls 01/22/2012  . Diabetic neuropathy ([place]) 10/14/2010  . Gout 12/22/2009  . Obstructive sleep apnea 07/10/2009  . CAD -s/p DES to marginal vessel at [facility] 2014 04/09/2009  . BPH (benign prostatic hyperplasia) 09/25/2008  . Gastroparesis 06/19/2008  . Low back pain 12/15/2007  . Posttraumatic stress disorder 11/03/2007  . 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 Allergic rhinitis 03/01/2007  . Essential hypertension 01/17/2007  . History of stroke 01/17/2007    Starr Lake PT, DPT, LAT, ATC  01/18/20  5:00 PM      [facility] [address] [place], [place], [address] Phone: [phone]   Fax:  [phone]  Name: [name] MRN: [id number] Date of Birth: 1951-02-24

## 2020-01-28 DIAGNOSIS — E118 Type 2 diabetes mellitus with unspecified complications: Secondary | ICD-10-CM | POA: Diagnosis not present

## 2020-01-30 LAB — HEMOGLOBIN A1C: Hemoglobin A1C: 7.1

## 2020-02-01 DIAGNOSIS — E785 Hyperlipidemia, unspecified: Secondary | ICD-10-CM | POA: Diagnosis not present

## 2020-02-01 DIAGNOSIS — E1129 Type 2 diabetes mellitus with other diabetic kidney complication: Secondary | ICD-10-CM | POA: Diagnosis not present

## 2020-02-01 DIAGNOSIS — N2581 Secondary hyperparathyroidism of renal origin: Secondary | ICD-10-CM | POA: Diagnosis not present

## 2020-02-01 DIAGNOSIS — Z940 Kidney transplant status: Secondary | ICD-10-CM | POA: Diagnosis not present

## 2020-02-01 DIAGNOSIS — I129 Hypertensive chronic kidney disease with stage 1 through stage 4 chronic kidney disease, or unspecified chronic kidney disease: Secondary | ICD-10-CM | POA: Diagnosis not present

## 2020-02-05 ENCOUNTER — Ambulatory Visit: Payer: Medicare Other | Admitting: Physical Therapy

## 2020-02-06 ENCOUNTER — Ambulatory Visit: Payer: Medicare Other | Attending: Internal Medicine | Admitting: Physical Therapy

## 2020-02-06 DIAGNOSIS — R2689 Other abnormalities of gait and mobility: Secondary | ICD-10-CM | POA: Insufficient documentation

## 2020-02-06 DIAGNOSIS — M545 Low back pain: Secondary | ICD-10-CM | POA: Insufficient documentation

## 2020-02-06 DIAGNOSIS — R2681 Unsteadiness on feet: Secondary | ICD-10-CM | POA: Insufficient documentation

## 2020-02-06 DIAGNOSIS — G8929 Other chronic pain: Secondary | ICD-10-CM | POA: Insufficient documentation

## 2020-02-06 DIAGNOSIS — M6281 Muscle weakness (generalized): Secondary | ICD-10-CM | POA: Insufficient documentation

## 2020-02-07 ENCOUNTER — Telehealth: Payer: Self-pay | Admitting: Physical Therapy

## 2020-02-07 NOTE — Telephone Encounter (Signed)
Attempted to contact patient due to missed PT appointment yesterday (02/06/2020 at 12:15pm). Patient was informed of missed appointment and reminded of next scheduled appointment for tomorrow (05/09/2020). Patient encouraged to call in order to reschedule or cancel visits due to attendance policy.   Hilda Blades, PT, DPT, LAT, ATC 02/07/20  10:30 AM Phone: [phone] Fax: [phone]

## 2020-02-08 ENCOUNTER — Ambulatory Visit: Payer: Medicare Other | Admitting: Physical Therapy

## 2020-02-08 ENCOUNTER — Telehealth: Payer: Self-pay | Admitting: Family Medicine

## 2020-02-08 NOTE — Telephone Encounter (Signed)
Pt called stating he fell this afternoon before he was going to physical therapy. Pt states his physical therapist told him he needs to get fitted for orthotics due to his foot drop. Pt asked if CMA or Dr. Yong Channel would give him a call. Please advise.

## 2020-02-12 ENCOUNTER — Ambulatory Visit: Payer: Medicare Other | Admitting: Physical Therapy

## 2020-02-12 ENCOUNTER — Encounter: Payer: Self-pay | Admitting: Physical Therapy

## 2020-02-12 ENCOUNTER — Other Ambulatory Visit: Payer: Self-pay

## 2020-02-12 DIAGNOSIS — M6281 Muscle weakness (generalized): Secondary | ICD-10-CM

## 2020-02-12 DIAGNOSIS — R2689 Other abnormalities of gait and mobility: Secondary | ICD-10-CM | POA: Diagnosis not present

## 2020-02-12 DIAGNOSIS — M545 Low back pain, unspecified: Secondary | ICD-10-CM

## 2020-02-12 DIAGNOSIS — R2681 Unsteadiness on feet: Secondary | ICD-10-CM

## 2020-02-12 DIAGNOSIS — G8929 Other chronic pain: Secondary | ICD-10-CM

## 2020-02-12 NOTE — Telephone Encounter (Signed)
Pt called following up on orthotics. Pt states he had another bad fall yesterday. Please advise.

## 2020-02-12 NOTE — Patient Instructions (Signed)
Access Code: [facility] URL: [email or web address] Date: 02/12/2020 Prepared by: Hilda Blades  Exercises Supine March - 1 x daily - 7 x weekly - 3 sets - 5 reps Bridge - 1 x daily - 7 x weekly - 3 sets - 5 reps Seated March - 1-2 x daily - 7 x weekly - 2 sets - 15 reps Seated Long Arc Quad - 1-2 x daily - 7 x weekly - 2 sets - 15 reps Seated Hip Abduction with Resistance - 1-2 x daily - 7 x weekly - 2 sets - 10 reps Seated Heel Toe Raises - 1-2 x daily - 7 x weekly - 2 sets - 20 reps Sit to Stand with Counter Support - 1-2 x daily - 7 x weekly - 5 reps

## 2020-02-13 ENCOUNTER — Encounter: Payer: Self-pay | Admitting: Physical Therapy

## 2020-02-13 NOTE — Telephone Encounter (Signed)
[place], can you please handle this?

## 2020-02-13 NOTE — Telephone Encounter (Signed)
LVM asking him to return my call here at the office. Office number was provided.

## 2020-02-13 NOTE — Therapy (Addendum)
[place], [place], [address] Phone: [phone]   Fax:  [phone]  Physical Therapy Treatment / Discharge  Patient Details  Name: [name] MRN: [id number] Date of Birth: 1951/03/20 Referring Provider (PT):  Geradine Girt, DO    Encounter Date: 02/12/2020   PT End of Session - 02/12/20 1533    Visit Number 2    Number of Visits 17    Date for PT Re-Evaluation 03/14/20    Authorization Type MCR: kx mod at 15th visit    Progress Note Due on Visit 10    PT Start Time 1615    PT Stop Time 1700    PT Time Calculation (min) 45 min    Activity Tolerance Patient tolerated treatment well;Patient limited by pain    Behavior During Therapy [facility] for tasks assessed/performed           Past Medical History:  Diagnosis Date  . Allergy   . Angina   . Arthritis   . Backache, unspecified   . CHF (congestive heart failure) ([place])   . Chills   . Chronic kidney disease, stage III (moderate)    HD T- TH-SAT  . Complication of anesthesia    01/2011 could not eat,, hospt x2, was placed on hd and cleared up  . Coronary atherosclerosis of native coronary artery   . Diabetes mellitus 2004  . Dizziness   . Dysphagia, unspecified(787.20)   . Fall at home 11/2019  . Gastroparesis   . Headache(784.0)   . Hemorrhage of rectum and anus   . Hyperlipidemia   . Hypertrophy of prostate with urinary obstruction and other lower urinary tract symptoms (LUTS)   . INTERNAL HEMORRHOIDS 10/16/2008   Qualifier: Diagnosis of  By: Nolon Rod CMA (AAMA), Robin    . Internal hemorrhoids without mention of complication   . Myocardial infarction ([place]) 2002  . Nausea alone   . Obesity   . Other dyspnea and respiratory abnormality   . Other malaise and fatigue   . Personal history of unspecified circulatory disease   . Posttraumatic stress disorder   . Prostate cancer ([place])   . Sleep apnea    USES CPAP   . Stroke [facility])     2007  . Unspecified essential hypertension    hx htn     Past Surgical History:  Procedure Laterality Date  . ANAL FISSURECTOMY    . AV FISTULA PLACEMENT    . CARDIAC CATHETERIZATION     2005 DR BRODIE  . HEMORRHOID SURGERY    . 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 PROSTATE BIOPSY    . revision of fistula     renal failure  . VENTRAL HERNIA REPAIR  07/01/2011   Procedure: HERNIA REPAIR VENTRAL ADULT;  Surgeon: Joyice Faster. Cornett, MD;  Location: [place];  Service: General;  Laterality: N/A;    There were no vitals filed for this visit.   Subjective Assessment - 02/12/20 1532    Subjective Patient notes that he fell before therapy last Thursday while in the kitchen due to his rollator not locking well. Then he fell again yesterday while he was in his closet when he bent forward. Patient notes that his dizziness probably leads to more of his falls.    Patient Stated Goals Improve strength in back and hips, stand up with minimal assistance, to walk better.    Currently in Pain? Yes    Pain Score 8  Pain Location Back    Pain Orientation Left    Pain Descriptors / Indicators Aching;Tightness;Sharp    Pain Type Chronic pain    Pain Radiating Towards pain radiating down left side all the way down to his foot, then foot throbs    Pain Onset More than a month ago    Pain Frequency Constant              [facility] PT Assessment - 02/13/20 0001      Strength   Left Hip Flexion 3-/5    Left Knee Extension 3-/5    Left Ankle Dorsiflexion 3-/5                         [facility] Adult PT Treatment/Exercise - 02/13/20 0001      Exercises   Exercises Knee/Hip      Knee/Hip Exercises: Aerobic   Nustep L4 x 5 min (LE only)      Knee/Hip Exercises: Seated   Long Arc Quad 2 sets;5 sets    [employer] Limitations limited on left with increased low back pain    Other Seated Knee/Hip Exercises Heel-toe raises x 10   limited on left   Marching 2 sets;5 reps    Marching Limitations  limited on left    Sit to Sand 5 reps;with UE support   require UE support, cued for controlled lowering     Knee/Hip Exercises: Supine   Bridges 2 sets;5 reps    Bridges Limitations partial range    Other Supine Knee/Hip Exercises Alternating marching 2 x 5 each   limited on left                 PT Education - 02/12/20 1533    Education Details HEP update, need to work left side, follow-up with doctor regarding dizziness    Person(s) Educated Patient    Methods Explanation;Demonstration;Verbal cues;Handout;Tactile cues    Comprehension Verbalized understanding;Returned demonstration;Verbal cues required;Tactile cues required;Need further instruction            PT Short Term Goals - 01/18/20 1645      PT SHORT TERM GOAL #1   Title Pt will be I and compliant with HEP.    Time 4    Period Weeks    Status New    Target Date 02/15/20      PT SHORT TERM GOAL #2   Title pt to be able to perform 5 x sit to stand </= 20 seconds    Time 4    Period Weeks    Status New    Target Date 02/15/20      PT SHORT TERM GOAL #3   Title to assess berg balance or slimilar balance testing and make goals accordingly.    Time 4    Period Weeks    Status New    Target Date 02/15/20             PT Long Term Goals - 01/18/20 1646      PT LONG TERM GOAL #1   Title increase bil LE strenth to >/= 4/5 to promote hip/ knee stability with walking/ standing    Time 8    Period Weeks    Status New    Target Date 03/14/20      PT LONG TERM GOAL #2   Title increase FOTO to </= 70% limited for funcitonal improvement    Time 8    Target  Date 03/14/20      PT LONG TERM GOAL #3   Title decrease 5 x sit to stand to </= 15 seconds for improvement in function    Baseline -    Time 8    Period Weeks    Status New    Target Date 03/14/20      PT LONG TERM GOAL #4   Title pt to report of falls or near falls for >/= 3 weeks for improvement in function    Time 8    Period Weeks     Status New    Target Date 03/14/20      PT LONG TERM GOAL #5   Title pt to be I with all HEP given to maintain and progress current LOF IND    Time 8    Period Weeks    Status New    Target Date 03/14/20                 Plan - 02/12/20 1534    Clinical Impression Statement Patient tolerated therapy well with no adverse effects. He continues to report frequent falls, but noted this visit that he feels the majority of his falls are a result of 1 month history of dizziness and feeling of poor spatial awareness when he bends forward, looks up, or with quick movements. He was encouraged to reach out to his doctor regarding his recent onset of dizziness for further evaluation. Patient continues to be limited with therapy due to lower back and left leg pain, which limited his ability to perform exercise on that side. His strengthening exercises were progressed this visit and patient was encouraged to focus on using left side as tolerated, but the need to exercise in order to improve strength and reduce falls. He exhibits gross strength deficit of left side but did not demonstrate significant foot drop or instabiliy when using rollator. He would benefit from continued skilled physical therapy to increase bil LE strength in order to improve stability / balance and maximize safety and overall function.    PT Treatment/Interventions ADLs/Self Care Home Management;Cryotherapy;Moist Heat;Gait training;Stair training;Functional mobility training;Therapeutic activities;Therapeutic exercise;Balance training;Neuromuscular re-education;Patient/family education;Manual techniques;Passive range of motion;Dry needling;Taping;Aquatic Therapy    PT Next Visit Plan Review HEP and update PRN, gross LE strengthening, balance training    PT Home Exercise Plan [facility]    Consulted and Agree with Plan of Care Patient           Patient will benefit from skilled therapeutic intervention in order to improve the  following deficits and impairments:  Abnormal gait, Improper body mechanics, Decreased activity tolerance, Postural dysfunction, Increased muscle spasms, Decreased endurance, Pain, Decreased balance, Decreased range of motion  Visit Diagnosis: Chronic bilateral low back pain, unspecified whether sciatica present  Muscle weakness (generalized)  Other abnormalities of gait and mobility  Unsteadiness on feet     Problem List Patient Active Problem List   Diagnosis Date Noted  . Protrusion of lumbar intervertebral disc   . Malignant neoplasm of prostate ([place]) 06/09/2019  . PAD (peripheral artery disease) ([place]) 01/16/2019  . Spinal stenosis of lumbar region 09/21/2018  . Morbid obesity ([place]) 12/04/2017  . Mild cognitive impairment 06/18/2016  . Hyperlipidemia 01/16/2016  . Immunosuppressive management encounter following kidney transplant 07/12/2015  . Renal transplant recipient 06/27/2015  . Immunosuppression ([place]) 06/27/2015  . Ascending aorta dilatation-4.7 cm per ECHO [facility] 2014 10/23/2014  . GERD (gastroesophageal reflux disease) 05/16/2014  . Trigger thumb of right hand 01/03/2014  .  Left foot drop 05/02/2013  . ED (erectile dysfunction) 04/03/2013  . H/O iron deficiency anemia 02/10/2012  . Risk for falls 01/22/2012  . Diabetic neuropathy ([place]) 10/14/2010  . Gout 12/22/2009  . Obstructive sleep apnea 07/10/2009  . CAD -s/p DES to marginal vessel at [facility] 2014 04/09/2009  . BPH (benign prostatic hyperplasia) 09/25/2008  . Gastroparesis 06/19/2008  . Low back pain 12/15/2007  . Posttraumatic stress disorder 11/03/2007  . 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 Allergic rhinitis 03/01/2007  . Essential hypertension 01/17/2007  . History of stroke 01/17/2007    Hilda Blades, PT, DPT, LAT, ATC 02/13/20  8:16 AM Phone: [phone] Fax: [place]  [facility] [address] [place], [place], [address] Phone: [phone]   Fax:  [phone]  Name: [name] MRN: [id number] Date of Birth: [birth year]      PHYSICAL THERAPY DISCHARGE SUMMARY  Visits from Start of Care: 2  Current functional level related to goals / functional outcomes: See goals   Remaining deficits: Current status unknown   Education / Equipment: HEP  Plan: Patient agrees to discharge.  Patient goals were not met. Patient is being discharged due to not returning since the last visit.  ?????         Kristoffer Leamon PT, DPT, LAT, ATC  02/29/20  2:06 PM

## 2020-02-15 ENCOUNTER — Ambulatory Visit: Payer: Medicare Other | Admitting: Physical Therapy

## 2020-02-19 ENCOUNTER — Ambulatory Visit: Payer: Medicare Other | Admitting: Physical Therapy

## 2020-02-19 ENCOUNTER — Encounter: Payer: Self-pay | Admitting: Family Medicine

## 2020-02-19 ENCOUNTER — Telehealth: Payer: Self-pay | Admitting: Physical Therapy

## 2020-02-19 DIAGNOSIS — E1165 Type 2 diabetes mellitus with hyperglycemia: Secondary | ICD-10-CM | POA: Diagnosis not present

## 2020-02-19 DIAGNOSIS — I2510 Atherosclerotic heart disease of native coronary artery without angina pectoris: Secondary | ICD-10-CM | POA: Diagnosis not present

## 2020-02-19 DIAGNOSIS — E785 Hyperlipidemia, unspecified: Secondary | ICD-10-CM | POA: Diagnosis not present

## 2020-02-19 DIAGNOSIS — Z940 Kidney transplant status: Secondary | ICD-10-CM | POA: Diagnosis not present

## 2020-02-19 DIAGNOSIS — Z794 Long term (current) use of insulin: Secondary | ICD-10-CM | POA: Diagnosis not present

## 2020-02-19 NOTE — Telephone Encounter (Signed)
Attempted to contact patient due to missed PT appointment on 02/15/2020. Patient was informed of missed appointment and reminded of next scheduled appointment for tomorrow for 02/19/2020. Patient reminded of attendance policy and encouraged to call in order to reschedule or cancel visits.  Hilda Blades, PT, DPT, LAT, ATC 02/19/20  10:31 AM Phone: [phone] Fax: [phone]

## 2020-02-20 NOTE — Telephone Encounter (Signed)
Pt's Daughter is calling and checking on the status of this. Daughter states that the pt is dizzy very often, as welling as falling often. She asks that we give her a call asap. She has now moved in with her father to look after him.     Please call [phone]

## 2020-02-21 ENCOUNTER — Telehealth: Payer: Self-pay | Admitting: Orthopaedic Surgery

## 2020-02-21 NOTE — Telephone Encounter (Signed)
Called patient left message to return call to schedule an appointment per mychart message with Dr. Lorin Mercy  Per patient  Hospital F/U continuing falls since December 13, 2019

## 2020-02-22 ENCOUNTER — Telehealth: Payer: Self-pay | Admitting: Physical Therapy

## 2020-02-22 ENCOUNTER — Ambulatory Visit: Payer: Medicare Other | Admitting: Physical Therapy

## 2020-02-22 ENCOUNTER — Other Ambulatory Visit: Payer: Self-pay

## 2020-02-22 DIAGNOSIS — M21379 Foot drop, unspecified foot: Secondary | ICD-10-CM

## 2020-02-22 NOTE — Telephone Encounter (Signed)
Attempted to contact patient in regard to missed PT appointment. Patient was informed of the missed appointment and since this was his 3rd consecutive no-show, and 4th total no-show, he would be discharged from PT and would need a new PT referral in order to schedule further visits per the attendance policy. It looks like the patient was referred to sports medicine so the patient was instructed to attend that visit and based on that examination to determine whether further PT would be indicated, and if so, have a new PT order placed.  Hilda Blades, PT, DPT, LAT, ATC 02/22/20  4:52 PM Phone: [phone] Fax: [phone]

## 2020-02-26 ENCOUNTER — Ambulatory Visit: Payer: Medicare Other | Admitting: Family Medicine

## 2020-02-26 NOTE — Progress Notes (Deleted)
Phone [phone] In person visit   Subjective:   [name] is a 69 y.o. year old very pleasant male patient who presents for/with See problem oriented charting No chief complaint on file.   This visit occurred during the SARS-CoV-2 public health emergency.  Safety protocols were in place, including screening questions prior to the visit, additional usage of staff PPE, and extensive cleaning of exam room while observing appropriate contact time as indicated for disinfecting solutions.   Past Medical History-  Patient Active Problem List   Diagnosis Date Noted  . Protrusion of lumbar intervertebral disc   . Malignant neoplasm of prostate ([place]) 06/09/2019  . PAD (peripheral artery disease) ([place]) 01/16/2019  . Spinal stenosis of lumbar region 09/21/2018  . Morbid obesity ([place]) 12/04/2017  . Mild cognitive impairment 06/18/2016  . Hyperlipidemia 01/16/2016  . Immunosuppressive management encounter following kidney transplant 07/12/2015  . Renal transplant recipient 06/27/2015  . Immunosuppression ([place]) 06/27/2015  . 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 GERD (gastroesophageal reflux disease) 05/16/2014  . Trigger thumb of right hand 01/03/2014  . Left foot drop 05/02/2013  . ED (erectile dysfunction) 04/03/2013  . H/O iron deficiency anemia 02/10/2012  . Risk for falls 01/22/2012  . Diabetic neuropathy ([place]) 10/14/2010  . Gout 12/22/2009  . Obstructive sleep apnea 07/10/2009  . CAD -s/p DES to marginal vessel at [facility] 2014 04/09/2009  . BPH (benign prostatic hyperplasia) 09/25/2008  . Gastroparesis 06/19/2008  . Low back pain 12/15/2007  . Posttraumatic stress disorder 11/03/2007  . 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 Allergic rhinitis 03/01/2007  . Essential hypertension 01/17/2007  . History of stroke 01/17/2007    Medications- reviewed and updated Current  Outpatient Medications  Medication Sig Dispense Refill  . acetaminophen (TYLENOL) 500 MG tablet Take 1,000 mg by mouth as needed for mild pain or headache.     . allopurinol (ZYLOPRIM) 100 MG tablet Take 200 mg by mouth daily.     [name] aspirin 81 MG tablet Take 81 mg by mouth at bedtime.     [name] atorvastatin (LIPITOR) 40 MG tablet Take 40 mg by mouth daily.    . clopidogrel (PLAVIX) 75 MG tablet Take 75 mg by mouth daily.     . diazepam (VALIUM) 5 MG tablet Take 1 tablet (5 mg total) by mouth at bedtime. (Patient taking differently: Take 5 mg by mouth every 8 (eight) hours as needed for muscle spasms. ) 30 tablet 0  . diclofenac Sodium (VOLTAREN) 1 % GEL Apply 2 g topically 4 (four) times daily. 50 g 0  . furosemide (LASIX) 40 MG tablet Take 1 tablet (40 mg total) by mouth daily as needed for fluid. 30 tablet   . gabapentin (NEURONTIN) 300 MG capsule Take 2 capsules in morning, 1 capsule in afternoon, and 2 capsules at night. (Patient taking differently: Take 300 mg by mouth See admin instructions. Take 2 capsules (600mg ) by mouth in the morning, take 1 capsule (300mg ) by mouth midday, and take 2 capsules (600mg ) by mouth at night.) 150 capsule 5  . glucose blood (ACCU-CHEK AVIVA PLUS) test strip USE TO TEST BLOOD SUGAR 3 TIMES A DAY 300 strip 1  . insulin aspart protamine- aspart (NOVOLOG MIX 70/30) (70-30) 100 UNIT/ML injection Inject 75 Units into the skin 2 (two) times daily with a meal.     . Insulin Syringe-Needle U-100 (B-D INS SYR ULTRAFINE 1CC/31G) 31G X 5/16" 1  ML MISC Used to give insulin injections twice daily. 100 each 11  . lidocaine (LIDODERM) 5 % Place 1 patch onto the skin daily. Remove & Discard patch within 12 hours or as directed by MD 30 patch 0  . Magnesium 200 MG TABS Take 200 mg by mouth every evening.     . mycophenolate (MYFORTIC) 180 MG EC tablet Take 180-360 mg by mouth See admin instructions. Take 2 tablets (360mg ) by mouth in the morning and 1 tablet (180mg ) by mouth at  night.    . nitroGLYCERIN (NITROSTAT) 0.4 MG SL tablet PLACE 1 TABLET (0.4 MG TOTAL) UNDER THE TONGUE EVERY 5 (FIVE) MINUTES AS NEEDED FOR CHEST PAIN. 25 tablet 2  . nortriptyline (PAMELOR) 50 MG capsule TAKE 2 CAPSULES (100 MG TOTAL) BY MOUTH AT BEDTIME. 180 capsule 2  . omeprazole (PRILOSEC) 20 MG capsule Take 1 capsule (20 mg total) by mouth 2 (two) times daily before a meal. 180 capsule 3  . Semaglutide,0.25 or 0.5MG /DOS, (OZEMPIC, 0.25 OR 0.5 MG/DOSE,) 2 MG/1.5ML SOPN Inject 1 mg into the skin once a week.     . tacrolimus (PROGRAF) 5 MG capsule Take 5 mg by mouth 2 (two) times daily.     . Vitamin D, Ergocalciferol, (DRISDOL) 1.25 MG (50000 UNIT) CAPS capsule Take 50,000 Units by mouth 2 (two) times a week.     No current facility-administered medications for this visit.     Objective:  There were no vitals taken for this visit. Gen: NAD, resting comfortably CV: RRR no murmurs rubs or gallops Lungs: CTAB no crackles, wheeze, rhonchi Abdomen: soft/nontender/nondistended/normal bowel sounds. No rebound or guarding.  Ext: no edema Skin: warm, dry Neuro: grossly normal, moves all extremities  ***    Assessment and Plan  Discuss Falls S:***  A/P: ***     *** 03/22/2019 mri abdomen 1. 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 2. Hemorrhagic/proteinaceous 0.9 cm renal cortical lesion in the right lateral portion of the right lower quadrant renal transplant, incompletely characterized on this noncontrast MRI study. 3. Follow-up noncontrast MRI    #Hospital follow-up S: 69 year old male renal transplant recipient also with history of coronary artery disease and CHF who presented to the hospital after fall.  Patient  reported chronic back pain and lower extremity weakness-he follows with Dr. Lorin Mercy as outpatient orthopedics and has undergone epidural injections almost yearly with some relief.  Also follows with neurology for neuropathy/tremors-gabapentin dosage has been tapered down recently due to intermittent myoclonus (lyrica worsens his ptsde)and amitriptyline have been increased to help with pain/tremors.  Patient had worsening sciatic pain on the left side and weakness requiring him to use a walker consistently instead of just on bad days.  On the day of admission he was try to restart rack of clothes in his closet and felt somewhat lightheaded when he stood back up resulting in a fall where he landed on his knees and then on his side.  Thankfully no loss of consciousness.  He was complaining of left-sided chest pain after the fall as well as neck pain.  Denies neck trauma but did feel some neck pain after the fall.  Steroids were avoided due to his diabetes and overall poor control.  He was discharged on gabapentin 300 mg 3 times daily and amitriptyline as per neurology.  MRI of lumbar spine showed 6 severe degenerative disc disease areas/foraminal stenosis similar to prior imaging.  Patient also had atypical chest pain as above-thought to be musculoskeletal.  EKG was unremarkable.  Troponin levels were flat  For orthostatic hypotension-Norvasc was held.  Some concern for adrenal suppression with initial cortisol level being low but stimulation test was normal.  Recommended to use compression stockings  For diabetes-patient was resumed on 70/30 insulin 50 units twice daily as well as Ozempic weekly.  Recommended diabetic diet.  Patient did have leukopenia/anemia-thought related to prostate cancer or prior radiation treatments.  Leukopenia was largely stable.  Hemoglobin down slightly to 11.7 from baseline around 13.  Patient is on aspirin and Plavix chronically with history of CVA but no blood in the stools  noted A/P: ***   Leg weakness/sciatica/low back pain-***  Atypical chest pain-low risk troponin pattern in hospital postop musculoskeletal.  No further work-up required.  Patient does have cardiologist if needed  For hypertension-thankfully blood pressure remains controlled despite being off Norvasc-continue to monitor***  for diabetes-currently managed at***  For leukopenia/anemia-***we will update CBC/CMP today  No problem-specific Assessment & Plan notes found for this encounter.   Recommended follow up: ***No follow-ups on file. Future Appointments  Date Time Provider [place]  02/26/2020  3:40 PM [name], MD [facility]  07/08/2020  1:20 PM [name], MD [facility]    Lab/Order associations: No diagnosis found.  No orders of the defined types were placed in this encounter.   Time Spent: *** minutes of total time (8:10 AM***- 8:10 AM***) was spent on the date of the encounter performing the following actions: chart review prior to seeing the patient, obtaining history, performing a medically necessary exam, counseling on the treatment plan, placing orders, and documenting in our EHR.   Return precautions advised.  Clyde Lundborg, CMA

## 2020-02-27 DIAGNOSIS — E118 Type 2 diabetes mellitus with unspecified complications: Secondary | ICD-10-CM | POA: Diagnosis not present

## 2020-02-28 NOTE — Progress Notes (Signed)
Phone [phone] In person visit   Subjective:   [name] is a 69 y.o. year old very pleasant male patient who presents for/with See problem oriented charting Chief Complaint  Patient presents with  . Frequent Falls   This visit occurred during the SARS-CoV-2 public health emergency.  Safety protocols were in place, including screening questions prior to the visit, additional usage of staff PPE, and extensive cleaning of exam room while observing appropriate contact time as indicated for disinfecting solutions.   Past Medical History-  Patient Active Problem List   Diagnosis Date Noted  . Malignant neoplasm of prostate ([place]) 06/09/2019    Priority: High  . PAD (peripheral artery disease) ([place]) 01/16/2019    Priority: High  . Morbid obesity ([place]) 12/04/2017    Priority: High  . Mild cognitive impairment 06/18/2016    Priority: High  . 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 Renal transplant recipient 06/27/2015    Priority: High  . Immunosuppression ([place]) 06/27/2015    Priority: High  . 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 Diabetic neuropathy ([place]) 10/14/2010    Priority: High  . CAD -s/p DES to marginal vessel at [facility] 04/09/2009    Priority: High  . 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 Protrusion of lumbar intervertebral disc     Priority: Medium  . Spinal stenosis of lumbar region 09/21/2018    Priority: Medium  . Hyperlipidemia 01/16/2016    Priority: Medium  . Gout 12/22/2009    Priority: Medium  . Obstructive sleep apnea 07/10/2009    Priority: Medium  . BPH (benign prostatic hyperplasia) 09/25/2008    Priority: Medium  . Low back pain 12/15/2007    Priority: Medium  . Posttraumatic stress disorder 11/03/2007    Priority: Medium  . Essential  hypertension 01/17/2007    Priority: Medium  . History of stroke 01/17/2007    Priority: Medium  . GERD (gastroesophageal reflux disease) 05/16/2014    Priority: Low  . Trigger thumb of right hand 01/03/2014    Priority: Low  . Left foot drop 05/02/2013    Priority: Low  . ED (erectile dysfunction) 04/03/2013    Priority: Low  . Risk for falls 01/22/2012    Priority: Low  . Gastroparesis 06/19/2008    Priority: Low  . Allergic rhinitis 03/01/2007    Priority: Low  . H/O iron deficiency anemia 02/10/2012    Medications- reviewed and updated Current Outpatient Medications  Medication Sig Dispense Refill  . acetaminophen (TYLENOL) 500 MG tablet Take 1,000 mg by mouth as needed for mild pain or headache.     . allopurinol (ZYLOPRIM) 100 MG tablet Take 200 mg by mouth daily.     [name] aspirin 81 MG tablet Take 81 mg by mouth at bedtime.     [name] atorvastatin (LIPITOR) 40 MG tablet Take 40 mg by mouth daily.    . clopidogrel (PLAVIX) 75 MG tablet Take 75 mg by mouth daily.     . diclofenac Sodium (VOLTAREN) 1 % GEL Apply 2 g topically 4 (four) times daily. 50 g 0  . furosemide (LASIX) 40 MG tablet Take 1 tablet (40 mg total) by mouth daily as needed for fluid. 30 tablet   . gabapentin (NEURONTIN) 300 MG capsule Take 2 capsules in morning, 1 capsule in afternoon, and  2 capsules at night. (Patient taking differently: Take 300 mg by mouth See admin instructions. Take 2 capsules (600mg ) by mouth in the morning, take 1 capsule (300mg ) by mouth midday, and take 2 capsules (600mg ) by mouth at night.) 150 capsule 5  . glucose blood (ACCU-CHEK AVIVA PLUS) test strip USE TO TEST BLOOD SUGAR 3 TIMES A DAY 300 strip 1  . insulin aspart protamine- aspart (NOVOLOG MIX 70/30) (70-30) 100 UNIT/ML injection Inject 75 Units into the skin 2 (two) times daily with a meal.     . lidocaine (LIDODERM) 5 % Place 1 patch onto the skin daily. Remove & Discard patch within 12 hours or as directed by MD 30 patch 0  .  Magnesium 200 MG TABS Take 200 mg by mouth every evening.     . mycophenolate (MYFORTIC) 180 MG EC tablet Take 180-360 mg by mouth See admin instructions. Take 2 tablets (360mg ) by mouth in the morning and 1 tablet (180mg ) by mouth at night.    . nitroGLYCERIN (NITROSTAT) 0.4 MG SL tablet PLACE 1 TABLET (0.4 MG TOTAL) UNDER THE TONGUE EVERY 5 (FIVE) MINUTES AS NEEDED FOR CHEST PAIN. 25 tablet 2  . omeprazole (PRILOSEC) 20 MG capsule Take 1 capsule (20 mg total) by mouth 2 (two) times daily before a meal. 180 capsule 3  . Semaglutide,0.25 or 0.5MG /DOS, (OZEMPIC, 0.25 OR 0.5 MG/DOSE,) 2 MG/1.5ML SOPN Inject 1 mg into the skin once a week.     . tacrolimus (PROGRAF) 5 MG capsule Take 5 mg by mouth 2 (two) times daily.     . Vitamin D, Ergocalciferol, (DRISDOL) 1.25 MG (50000 UNIT) CAPS capsule Take 50,000 Units by mouth 2 (two) times a week.    . Insulin Syringe-Needle U-100 (B-D INS SYR ULTRAFINE 1CC/31G) 31G X 5/16" 1 ML MISC Used to give insulin injections twice daily. 100 each 11  . nortriptyline (PAMELOR) 50 MG capsule TAKE 2 CAPSULES (100 MG TOTAL) BY MOUTH AT BEDTIME. 180 capsule 2   No current facility-administered medications for this visit.     Objective:  BP 132/72   Pulse 74   Temp 97.9 F (36.6 C) (Temporal)   Ht 5\' 9"  (1.753 m)   Wt 241 lb 3.2 oz (109.4 kg)   SpO2 98%   BMI 35.62 kg/m  Gen: NAD, resting comfortably CV: RRR no murmurs rubs or gallops Lungs: CTAB no crackles, wheeze, rhonchi Ext: no edema Skin: warm, dry Neuro: in rolling walker sitting - is able to walk at end of visit without support.  4-/5 strength left lower leg, 4_/5 strength right lower leg with extension. 4-/5 strength with foot flexion vs 5-/5 on right    Assessment and Plan   #ongoing Falls S:Patient stated that he has had 5 falls in the last several months one of which sent him to the hospital.   Patient states most of his falls are related to changes in position.  Usually before the fall feels  lightheaded. Sometimes sees black spots. Usually happens with movement or position changes. For instance one time was trying to get something out of the closet and bent over and came back up. Has similar issues laying to sitting or sitting to standing.   Orthostatic risk factors med wise- lasix (but needs this) and tamsulosin and gabapentin (being reduced by Dr. Tomi Likens)  Has had multiple late visits or missed appointments due to difficulty with walking or lightheaded episodes. Has missed several PT visits for instance. Missed appointment on Monday- daughter  states he fell asleep and she simply could not wake him up- woke up hours after the appointment.   Last MRI of the brain 12/19/19 and he was having falls at that time- from last MRI " IMPRESSION: 1. No acute intracranial abnormality. 2. Mild age-related cerebral atrophy with chronic small vessel ischemic disease, with a few small remote left cerebellar infarcts, left PICA territory."  Having a lot of back pain, leg pain- MRI lumbar spine 12/19/19  "IMPRESSION: 1. No acute abnormality within the lumbar spine. 2. Small left foraminal disc protrusion at L4-5, closely approximating and potentially irritating the exiting left L4 nerve root. This is slightly increased in size/prominence as compared to previous. 3. Multifactorial degenerative changes at L5-S1, resulting in mild to moderate left greater than right lateral recess stenosis, with moderate bilateral L5 foraminal narrowing. Overall appearance is stable from previous. 4. Mild left eccentric disc bulge and facet hypertrophy at L2-3 and L3-4 with resultant mild left L2 and L3 foraminal stenosis, similar to prior."  Neuropathy increases risk fo falls- pain did not increase when decreasing gabapentin. Last Dr. Tomi Likens visit in July.  A/P: 69 year old male with increased fall risk multifactorial.  Multiple risk factors for fall including diabetic neuropathy (doesn't do well on uneven  surfaces), some of the medications he is on (diazepam, gabapentin), pain in back and weakness in the legs related to spinal stenosis, orthostatic symptoms (tamsulosin and flomax risks). No hypoglycemia with falls  Plan 1. Recommended avoid diazepam due to risk of falls. G 2. Gabapentin has been lowered from 8 pills a day to 5 by Dr. Tomi Likens.  3. Stop tamsulosin (denies UTis or significant urinary obstruction). I will update Dr. Gilford Rile through epic 4. Diagnosed with foot drop in past- PT had recommended orthopedic/SM evaluation for orthotic device for foot drop 5. 30 day event monitor to make sure no cardiac cause. - Presume orthostatic but want to rule out arrhythmia    # right throat discomfort/pain S:at night when laying on right side gets sensation of cramping in right side of throat- if puts finger on it he can stop it but he cannot fall asleep and keep his finger on this area. Goes to other side and symptoms resolve and he can sleep.    Apparently had issues with prior intubation and they used a tube that scraped the larynx. Used smaller tube in future and less issues- he wonders if could still have irriation.  A/P:  With how patient can modify position and symptoms resolve offered watchful waiting- he prefers a referral to ENT after discussion today and this was placed   Recommended follow up: keep February visit or sooner if recurrent fall issues Future Appointments  Date Time Provider [place]  03/04/2020  1:00 PM Gregor Hams, MD [facility] None  07/08/2020  1:20 PM [name], MD [facility]    Lab/Order associations:   ICD-10-CM   1. Recurrent falls  R29.6   2. Orthostatic hypotension  I95.1   3. Lightheadedness  R42 Cardiac event monitor  4. Neck pain on right side  M54.2 Ambulatory referral to ENT  5. Benign prostatic hyperplasia, unspecified whether lower urinary tract symptoms present  N40.0   6. Chronic low back pain with bilateral sciatica, unspecified back  pain laterality  M54.41    G89.29    M54.42   7. Diabetic polyneuropathy associated with type 2 diabetes mellitus (HCC)  E11.42    Time Spent: 42 minutes of total time (4:50-  5:20PM, 8:15-8:27 PM) was spent on the date of the encounter performing the following actions: chart review prior to seeing the patient, obtaining history, performing a medically necessary exam, counseling on the treatment plan, placing orders, updating DR. Georgina Snell and Dr. Lovena Neighbours, and documenting in our EHR.     Return precautions advised.  Garret Reddish, MD

## 2020-02-28 NOTE — Patient Instructions (Addendum)
Health Maintenance Due  Topic Date Due   TETANUS/TDAP - get this at pharmacy when you can Never done   COLONOSCOPY Sign release of information at the check out desk for last colonoscopy from 2017 with [facility]- we have not been able to get a copy yet  Never done   INFLUENZA VACCINE high dose flu shot today  Get covid booster in 2 weeks walk in at [place] and let us know when you get this 12/24/2019   Try off tamsulosin as this can increase risk of lightheadedness with standing  We will call you within two weeks about your referral for cardiac event monitor- to make sure no heart rhythm issues contributing to falls. If you do not hear within 3 weeks, give [place] a call.   We will call you within two weeks about your referral to ENT. If you do not hear within 3 weeks, give [place] a call.

## 2020-02-29 ENCOUNTER — Encounter: Payer: Self-pay | Admitting: Family Medicine

## 2020-02-29 ENCOUNTER — Ambulatory Visit (INDEPENDENT_AMBULATORY_CARE_PROVIDER_SITE_OTHER): Payer: Medicare Other | Admitting: Family Medicine

## 2020-02-29 VITALS — BP 132/72 | HR 74 | Temp 97.9°F | Ht 69.0 in | Wt 241.2 lb

## 2020-02-29 DIAGNOSIS — M5441 Lumbago with sciatica, right side: Secondary | ICD-10-CM

## 2020-02-29 DIAGNOSIS — N400 Enlarged prostate without lower urinary tract symptoms: Secondary | ICD-10-CM

## 2020-02-29 DIAGNOSIS — M542 Cervicalgia: Secondary | ICD-10-CM | POA: Diagnosis not present

## 2020-02-29 DIAGNOSIS — Z23 Encounter for immunization: Secondary | ICD-10-CM | POA: Diagnosis not present

## 2020-02-29 DIAGNOSIS — G8929 Other chronic pain: Secondary | ICD-10-CM

## 2020-02-29 DIAGNOSIS — R296 Repeated falls: Secondary | ICD-10-CM | POA: Diagnosis not present

## 2020-02-29 DIAGNOSIS — I951 Orthostatic hypotension: Secondary | ICD-10-CM | POA: Diagnosis not present

## 2020-02-29 DIAGNOSIS — E1142 Type 2 diabetes mellitus with diabetic polyneuropathy: Secondary | ICD-10-CM

## 2020-02-29 DIAGNOSIS — M5442 Lumbago with sciatica, left side: Secondary | ICD-10-CM

## 2020-02-29 DIAGNOSIS — R42 Dizziness and giddiness: Secondary | ICD-10-CM

## 2020-03-01 ENCOUNTER — Telehealth: Payer: Self-pay | Admitting: Radiology

## 2020-03-01 DIAGNOSIS — Z23 Encounter for immunization: Secondary | ICD-10-CM | POA: Diagnosis not present

## 2020-03-01 DIAGNOSIS — M542 Cervicalgia: Secondary | ICD-10-CM | POA: Diagnosis not present

## 2020-03-01 DIAGNOSIS — R296 Repeated falls: Secondary | ICD-10-CM | POA: Diagnosis not present

## 2020-03-01 NOTE — Telephone Encounter (Signed)
Enrolled patient for a 30 day [employer] Event Monitor to be mailed to patients home. Brief instructions were gone over with the patient and he knows to expect the monitor to arrive in 3-4 days.

## 2020-03-01 NOTE — Addendum Note (Signed)
Addended by: Thomes Cake on: 03/01/2020 08:58 AM   Modules accepted: Orders

## 2020-03-04 ENCOUNTER — Ambulatory Visit (INDEPENDENT_AMBULATORY_CARE_PROVIDER_SITE_OTHER): Payer: Medicare Other | Admitting: Family Medicine

## 2020-03-04 ENCOUNTER — Encounter: Payer: Self-pay | Admitting: Family Medicine

## 2020-03-04 VITALS — BP 130/90 | HR 100 | Ht 69.0 in | Wt 241.0 lb

## 2020-03-04 DIAGNOSIS — E1142 Type 2 diabetes mellitus with diabetic polyneuropathy: Secondary | ICD-10-CM

## 2020-03-04 DIAGNOSIS — M21372 Foot drop, left foot: Secondary | ICD-10-CM | POA: Diagnosis not present

## 2020-03-04 DIAGNOSIS — M5416 Radiculopathy, lumbar region: Secondary | ICD-10-CM | POA: Diagnosis not present

## 2020-03-04 MED ORDER — AMBULATORY NON FORMULARY MEDICATION: 0 refills | Status: DC

## 2020-03-04 NOTE — Progress Notes (Signed)
Subjective:   I, [name], am serving as a scribe for Dr. Lynne [name].  I'm seeing this patient as a consultation for: Ronald Olp, MD. Note will be routed back to referring provider/PCP.  CC: Foot drop/Low back pain   HPI: Patient is a 69 year old Male presenting to [place] for Foot drop, Numb Feet, Low back pain that has been going on for about a year. Patient states he has been everywhere and done everything. "poked and prodded by everyone."  His main reason for visit for consultation notes was evaluate foot drop and potentially ankle-foot orthosis to reduce falls.  Patient has been to physical therapy in person but had trouble going because he has transportation issues as he does not drive much.  He has not had trial of home health physical therapy.  Past medical history, Surgical history, Family history, Social history, Allergies, and medications have been entered into the medical record, reviewed.   Review of Systems: No new headache, visual changes, nausea, vomiting, diarrhea, constipation, dizziness, abdominal pain, skin rash, fevers, chills, night sweats, weight loss, swollen lymph nodes, body aches, joint swelling, muscle aches, chest pain, shortness of breath, mood changes, visual or auditory hallucinations.   Objective:    Vitals:   03/04/20 1323  BP: 130/90  Pulse: 100  SpO2: 98%   General: Well Developed, well nourished, and in no acute distress.  Neuro/Psych: Alert and oriented x3, extra-ocular muscles intact, able to move all 4 extremities, sensation grossly intact. Skin: Warm and dry, no rashes noted.  Respiratory: Not using accessory muscles, speaking in full sentences, trachea midline.  Cardiovascular: Pulses palpable, no extremity edema. Abdomen: Does not appear distended. MSK: L-spine nontender midline decreased lumbar motion. Reflexes intact bilateral extremities.  Strength reduced to left foot dorsiflexion 3+/5. Patient walks with a  old rolling walker that is too short.  Lab and Radiology Results EXAM: MRI LUMBAR SPINE WITHOUT CONTRAST  TECHNIQUE: Multiplanar, multisequence MR imaging of the lumbar spine was performed. No intravenous contrast was administered.  COMPARISON:  Prior MRI from 10/07/2018.  FINDINGS: Segmentation: Standard. Lowest well-formed disc space labeled the L5-S1 level.  Alignment: 4 mm retrolisthesis of L5 on S1, stable. Associated trace anterolisthesis of L4 on L5, also unchanged. Alignment otherwise normal with preservation of the normal lumbar lordosis.  Vertebrae: Vertebral body height maintained without evidence for acute or chronic fracture. Prominent chronic endplate Schmorl's node deformity noted at the superior endplate of L3, stable. Post radiation changes seen within the visualized bone marrow extending from the L5 level inferiorly into the sacrum. Elsewhere, visualized bone marrow signal intensity is mildly heterogeneous but overall within normal limits. Few scattered benign hemangiomata noted. No worrisome osseous lesions. No abnormal marrow edema.  Conus medullaris and cauda equina: Conus extends to the L1 level. Conus and cauda equina appear normal.  Paraspinal and other soft tissues: Paraspinous soft tissues demonstrate no acute finding. Bilateral renal atrophy with multiple scattered cysts noted bilaterally, largest of which measures 2 cm at the interpolar right kidney. A few of these are mildly complex in appearance, likely reflecting proteinaceous and/or hemorrhagic cyst as seen on prior abdominal MRI. Prominent distension of the partially visualized urinary bladder.  Disc levels:  L1-2: Negative interspace. Mild facet hypertrophy. No canal or foraminal stenosis.  L2-3: Mild diffuse disc bulge with disc desiccation. Disc bulging asymmetric to the left. Prominent endplate Schmorl's node deformity noted at the superior endplate of L3. Mild to moderate  facet and ligament flavum  hypertrophy. No significant spinal stenosis. Mild left L2 foraminal narrowing, unchanged. No significant right foraminal encroachment.  L3-4: Disc desiccation with minimal disc bulge, slightly asymmetric to the left. Mild to moderate facet and ligament flavum hypertrophy. No significant spinal stenosis. Mild left L3 foraminal narrowing, stable. No significant right foraminal encroachment.  L4-5: Mild diffuse disc bulge with disc desiccation. Superimposed small left foraminal disc protrusion is seen, slightly more prominent and focal in nature as compared to previous (series 8, image 27). This closely approximates the exiting left L4 nerve root. Moderate facet and ligament flavum hypertrophy with associated trace joint effusions. Resultant mild spinal stenosis, relatively unchanged. Mild left greater than right L4 foraminal narrowing, similar.  L5-S1: Trace retrolisthesis. Diffuse disc bulge with disc desiccation and intervertebral disc space narrowing. Mild reactive endplate spurring. Moderate bilateral facet hypertrophy. Resultant mild to moderate left greater than right lateral recess stenosis, descending S1 nerve root level. Moderate bilateral L5 foraminal narrowing, unchanged.  IMPRESSION: 1. No acute abnormality within the lumbar spine. 2. Small left foraminal disc protrusion at L4-5, closely approximating and potentially irritating the exiting left L4 nerve root. This is slightly increased in size/prominence as compared to previous. 3. Multifactorial degenerative changes at L5-S1, resulting in mild to moderate left greater than right lateral recess stenosis, with moderate bilateral L5 foraminal narrowing. Overall appearance is stable from previous. 4. Mild left eccentric disc bulge and facet hypertrophy at L2-3 and L3-4 with resultant mild left L2 and L3 foraminal stenosis, similar to prior.   Electronically Signed   By: Jeannine Boga M.D.   On: 12/19/2019 04:01  I, [name], personally (independently) visualized and performed the interpretation of the images attached in this note.   B12 checked 2017 was 710.   Impression and Recommendations:    Assessment and Plan: 69 y.o. male with left foot drop associated with frequent falls.  Foot drop likely due to lumbar radiculopathy seen on MRI July 2021.  Patient has had reasonable work-up already.  We will double check B12 that was checked about 4 years ago although expect that to be normal. Plan for home health physical therapy and for ankle-foot orthosis.  We will also prescribe a new rolling walker.  Recheck back in about a month.  Return sooner if needed.  PDMP not reviewed this encounter. Orders Placed This Encounter  Procedures  . B12    Standing Status:   Future    Number of Occurrences:   1    Standing Expiration Date:   03/04/2021  . Ambulatory referral to Home Health    Referral Priority:   Routine    Referral Type:   Home Health Care    Referral Reason:   Specialty Services Required    Requested Specialty:   [place]    Number of Visits Requested:   1   Meds ordered this encounter  Medications  . AMBULATORY NON FORMULARY MEDICATION    Sig: Ankle Foot Orthosis  Foot Drop M21.372 Disp 1    Dispense:  1 each    Refill:  0  . AMBULATORY NON FORMULARY MEDICATION    Sig: Rolling Walker.  Foot drop M21.372    Dispense:  1 each    Refill:  0    Discussed warning signs or symptoms. Please see discharge instructions. Patient expresses understanding.   The above documentation has been reviewed and is accurate and complete [name], M.D.

## 2020-03-04 NOTE — Patient Instructions (Addendum)
Thank you for coming in today.  Plan for physical therapy home health.   Plan for Ankle Foot Orthosis. [facility] here in [place] is a good place to start.   Address: [address], [place], [place] [address] Phone: [phone]  Recheck with me in 1 month.   Return or contact me sooner if needed.   Please get labs today before you leave

## 2020-03-05 LAB — VITAMIN B12: Vitamin B-12: 262 pg/mL (ref 211–911)

## 2020-03-05 NOTE — Progress Notes (Signed)
B12 is a little low but still in the normal range. I recommend that you add an over the counter B12 supplement. I do not think B12 deficiency is causing your problem.

## 2020-03-10 ENCOUNTER — Emergency Department (HOSPITAL_COMMUNITY)
Admission: EM | Admit: 2020-03-10 | Discharge: 2020-03-10 | Disposition: A | Discharge status: Home / Self Care | Payer: No Typology Code available for payment source | Attending: Emergency Medicine | Admitting: Emergency Medicine

## 2020-03-10 ENCOUNTER — Other Ambulatory Visit: Payer: Self-pay

## 2020-03-10 ENCOUNTER — Emergency Department (HOSPITAL_COMMUNITY): Payer: No Typology Code available for payment source

## 2020-03-10 DIAGNOSIS — I499 Cardiac arrhythmia, unspecified: Secondary | ICD-10-CM | POA: Diagnosis not present

## 2020-03-10 DIAGNOSIS — Z743 Need for continuous supervision: Secondary | ICD-10-CM | POA: Diagnosis not present

## 2020-03-10 DIAGNOSIS — R58 Hemorrhage, not elsewhere classified: Secondary | ICD-10-CM | POA: Diagnosis not present

## 2020-03-10 DIAGNOSIS — Z7901 Long term (current) use of anticoagulants: Secondary | ICD-10-CM | POA: Diagnosis not present

## 2020-03-10 DIAGNOSIS — I209 Angina pectoris, unspecified: Secondary | ICD-10-CM | POA: Diagnosis not present

## 2020-03-10 DIAGNOSIS — I130 Hypertensive heart and chronic kidney disease with heart failure and stage 1 through stage 4 chronic kidney disease, or unspecified chronic kidney disease: Secondary | ICD-10-CM | POA: Diagnosis not present

## 2020-03-10 DIAGNOSIS — Z7982 Long term (current) use of aspirin: Secondary | ICD-10-CM | POA: Insufficient documentation

## 2020-03-10 DIAGNOSIS — Z79899 Other long term (current) drug therapy: Secondary | ICD-10-CM | POA: Insufficient documentation

## 2020-03-10 DIAGNOSIS — N183 Chronic kidney disease, stage 3 unspecified: Secondary | ICD-10-CM | POA: Insufficient documentation

## 2020-03-10 DIAGNOSIS — I509 Heart failure, unspecified: Secondary | ICD-10-CM | POA: Insufficient documentation

## 2020-03-10 DIAGNOSIS — R404 Transient alteration of awareness: Secondary | ICD-10-CM | POA: Diagnosis not present

## 2020-03-10 DIAGNOSIS — R0602 Shortness of breath: Secondary | ICD-10-CM | POA: Diagnosis not present

## 2020-03-10 DIAGNOSIS — Z8546 Personal history of malignant neoplasm of prostate: Secondary | ICD-10-CM | POA: Insufficient documentation

## 2020-03-10 DIAGNOSIS — E119 Type 2 diabetes mellitus without complications: Secondary | ICD-10-CM | POA: Insufficient documentation

## 2020-03-10 DIAGNOSIS — Z794 Long term (current) use of insulin: Secondary | ICD-10-CM | POA: Diagnosis not present

## 2020-03-10 DIAGNOSIS — I208 Other forms of angina pectoris: Secondary | ICD-10-CM

## 2020-03-10 DIAGNOSIS — R079 Chest pain, unspecified: Secondary | ICD-10-CM | POA: Diagnosis not present

## 2020-03-10 LAB — CBC
HCT: 46.8 % (ref 39.0–52.0)
Hemoglobin: 14 g/dL (ref 13.0–17.0)
MCH: 27.9 pg (ref 26.0–34.0)
MCHC: 29.9 g/dL — ABNORMAL LOW (ref 30.0–36.0)
MCV: 93.2 fL (ref 80.0–100.0)
Platelets: 202 10*3/uL (ref 150–400)
RBC: 5.02 MIL/uL (ref 4.22–5.81)
RDW: 14.3 % (ref 11.5–15.5)
WBC: 3.6 10*3/uL — ABNORMAL LOW (ref 4.0–10.5)
nRBC: 0 % (ref 0.0–0.2)

## 2020-03-10 LAB — BASIC METABOLIC PANEL
Anion gap: 12 (ref 5–15)
BUN: 19 mg/dL (ref 8–23)
CO2: 27 mmol/L (ref 22–32)
Calcium: 9.9 mg/dL (ref 8.9–10.3)
Chloride: 99 mmol/L (ref 98–111)
Creatinine, Ser: 1.62 mg/dL — ABNORMAL HIGH (ref 0.61–1.24)
GFR, Estimated: 42 mL/min — ABNORMAL LOW (ref >60)
Glucose, Bld: 156 mg/dL — ABNORMAL HIGH (ref 70–99)
Potassium: 3.5 mmol/L (ref 3.5–5.1)
Sodium: 138 mmol/L (ref 135–145)

## 2020-03-10 LAB — TROPONIN I (HIGH SENSITIVITY)
Troponin I (High Sensitivity): 15 ng/L (ref <18)
Troponin I (High Sensitivity): 13 ng/L (ref <18)

## 2020-03-10 NOTE — ED Provider Notes (Signed)
[facility] EMERGENCY DEPARTMENT Provider Note   CSN: [id number] Arrival date & time: 03/10/20  1546     History Chief Complaint  Patient presents with  . Chest Pain    [name] is a 69 y.o. male.  HPI Patient reports he is here for chest pain.  He has had left anterior chest pain.  It was a pressure that occurred earlier this morning.  Patient reports that he has frequent falls.  That has been going on for months.  He reports his left leg is pretty useless for him.  Considers himself "right legged".  Patient reports that he took a shower this morning.  He uses a shower chair.  He set the chair outside of the shower to dry himself off.  He reports he ended up getting his left foot caught in the leg of the chair.  This caused him to fall to the floor.  He did not sustain any injury at that time.  He does however note that he fell a few weeks ago and hit the left side of his chest and has had some persistent pain.  He reports that it is really hard for him to get up off the floor when he falls.  Reports he has to struggle and it is also extremely anxiety producing.  Reports if he gets anxious that also seems to worsen chest pain.  The chest pain persisted for the morning so he took several doses of nitroglycerin.  Ports he took 4 nitroglycerin and is not sure if they helped or not.  Patient reports that he is also scheduled to get a ambulatory monitor placed.  Is supposed to come in the mail within a day or 2.  He sees Dr. Stanford Breed for his heart problems.    Past Medical History:  Diagnosis Date  . Allergy   . Angina   . Arthritis   . Backache, unspecified   . CHF (congestive heart failure) ([place])   . Chills   . Chronic kidney disease, stage III (moderate) (HCC)    HD T- TH-SAT  . Complication of anesthesia    01/2011 could not eat,, hospt x2, was placed on hd and cleared up  . Coronary atherosclerosis of native coronary artery   . Diabetes mellitus 2004  .  Dizziness   . Dysphagia, unspecified(787.20)   . Fall at home 11/2019  . Gastroparesis   . Headache(784.0)   . Hemorrhage of rectum and anus   . Hyperlipidemia   . Hypertrophy of prostate with urinary obstruction and other lower urinary tract symptoms (LUTS)   . INTERNAL HEMORRHOIDS 10/16/2008   Qualifier: Diagnosis of  By: Nolon Rod CMA (AAMA), Robin    . Internal hemorrhoids without mention of complication   . Myocardial infarction ([place]) 2002  . Nausea alone   . Obesity   . Other dyspnea and respiratory abnormality   . Other malaise and fatigue   . Personal history of unspecified circulatory disease   . Posttraumatic stress disorder   . Prostate cancer ([place])   . Sleep apnea    USES CPAP   . Stroke [facility])    2007  . Unspecified essential hypertension    hx htn     Patient Active Problem List   Diagnosis Date Noted  . Protrusion of lumbar intervertebral disc   . Malignant neoplasm of prostate ([place]) 06/09/2019  . PAD (peripheral artery disease) ([place]) 01/16/2019  . Spinal stenosis of lumbar region 09/21/2018  .  Morbid obesity ([place]) 12/04/2017  . Mild cognitive impairment 06/18/2016  . Hyperlipidemia 01/16/2016  . Immunosuppressive management encounter following kidney transplant 07/12/2015  . Renal transplant recipient 06/27/2015  . Immunosuppression ([place]) 06/27/2015  . 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 GERD (gastroesophageal reflux disease) 05/16/2014  . Trigger thumb of right hand 01/03/2014  . Left foot drop 05/02/2013  . ED (erectile dysfunction) 04/03/2013  . H/O iron deficiency anemia 02/10/2012  . Risk for falls 01/22/2012  . Diabetic neuropathy ([place]) 10/14/2010  . Gout 12/22/2009  . Obstructive sleep apnea 07/10/2009  . CAD -s/p DES to marginal vessel at [facility] 2014 04/09/2009  . BPH (benign prostatic hyperplasia) 09/25/2008  . Gastroparesis 06/19/2008  . Low back pain 12/15/2007  . Posttraumatic stress disorder 11/03/2007   . 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 Allergic rhinitis 03/01/2007  . Essential hypertension 01/17/2007  . History of stroke 01/17/2007    Past Surgical History:  Procedure Laterality Date  . ANAL FISSURECTOMY    . AV FISTULA PLACEMENT    . CARDIAC CATHETERIZATION     2005 DR BRODIE  . HEMORRHOID SURGERY    . 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 PROSTATE BIOPSY    . revision of fistula     renal failure  . VENTRAL HERNIA REPAIR  07/01/2011   Procedure: HERNIA REPAIR VENTRAL ADULT;  Surgeon: Joyice Faster. Cornett, MD;  Location: [facility] OR;  Service: General;  Laterality: N/A;       Family History  Problem Relation Age of Onset  . Heart disease Father   . Renal Disease Father   . Dementia Mother   . Heart attack Mother   . Diabetes Sister   . Heart disease Sister   . Diabetes Maternal Aunt   . Diabetes Maternal Uncle   . Diabetes Paternal Aunt   . Diabetes Paternal Uncle   . Heart disease Other   . Heart attack Brother   . Lung cancer Sister   . Renal Disease Brother   . Ovarian cancer Cousin   . Lung cancer Cousin   . Breast cancer Neg Hx   . Colon cancer Neg Hx   . Pancreatic cancer Neg Hx   . Prostate cancer Neg Hx     Social History   Tobacco Use  . Smoking status: Never Smoker  . Smokeless tobacco: Never Used  Vaping Use  . Vaping Use: Never used  Substance Use Topics  . Alcohol use: No  . Drug use: No    Home Medications Prior to Admission medications   Medication Sig Start Date End Date Taking? Authorizing Provider  acetaminophen (TYLENOL) 500 MG tablet Take 1,000 mg by mouth as needed for mild pain or headache.     [provider]  allopurinol (ZYLOPRIM) 100 MG tablet Take 200 mg by mouth daily.     [provider]  AMBULATORY NON FORMULARY MEDICATION Ankle Foot Orthosis  Foot Drop M21.372 Disp 1 03/04/20   Gregor Hams, MD  AMBULATORY NON FORMULARY MEDICATION Rolling  Walker.  Foot drop M21.372 03/04/20   Gregor Hams, MD  aspirin 81 MG tablet Take 81 mg by mouth at bedtime.     [provider]  atorvastatin (LIPITOR) 40 MG tablet Take 40 mg by mouth daily.    [provider]  clopidogrel (PLAVIX) 75 MG tablet Take 75 mg by mouth daily.     [provider]  diclofenac Sodium (VOLTAREN) 1 % GEL Apply 2 g topically 4 (four) times daily. 12/21/19   Geradine Girt, DO  furosemide (LASIX) 40 MG tablet Take 1 tablet (40 mg total) by mouth daily as needed for fluid. 12/22/19   Geradine Girt, DO  gabapentin (NEURONTIN) 300 MG capsule Take 2 capsules in morning, 1 capsule in afternoon, and 2 capsules at night. Patient taking differently: Take 300 mg by mouth See admin instructions. Take 2 capsules (600mg ) by mouth in the morning, take 1 capsule (300mg ) by mouth midday, and take 2 capsules (600mg ) by mouth at night. 08/09/19   Tomi Likens, Adam R, DO  glucose blood (ACCU-CHEK AVIVA PLUS) test strip USE TO TEST BLOOD SUGAR 3 TIMES A DAY 05/09/19   [name], MD  insulin aspart protamine- aspart (NOVOLOG MIX 70/30) (70-30) 100 UNIT/ML injection Inject 75 Units into the skin 2 (two) times daily with a meal.     [provider]  Insulin Syringe-Needle U-100 (B-D INS SYR ULTRAFINE 1CC/31G) 31G X 5/16" 1 ML MISC Used to give insulin injections twice daily. 01/10/20   [name], MD  lidocaine (LIDODERM) 5 % Place 1 patch onto the skin daily. Remove & Discard patch within 12 hours or as directed by MD 12/22/19   Geradine Girt, DO  Magnesium 200 MG TABS Take 200 mg by mouth every evening.     [provider]  mycophenolate (MYFORTIC) 180 MG EC tablet Take 180-360 mg by mouth See admin instructions. Take 2 tablets (360mg ) by mouth in the morning and 1 tablet (180mg ) by mouth at night.    [provider]  nitroGLYCERIN (NITROSTAT) 0.4 MG SL tablet PLACE 1 TABLET (0.4 MG TOTAL) UNDER THE TONGUE EVERY 5 (FIVE) MINUTES AS  NEEDED FOR CHEST PAIN. 01/25/18   Skeet Latch, MD  nortriptyline (PAMELOR) 50 MG capsule TAKE 2 CAPSULES (100 MG TOTAL) BY MOUTH AT BEDTIME. 01/01/20   Tomi Likens, Adam R, DO  omeprazole (PRILOSEC) 20 MG capsule Take 1 capsule (20 mg total) by mouth 2 (two) times daily before a meal. 03/01/19   [name], MD  Semaglutide,0.25 or 0.5MG /DOS, (OZEMPIC, 0.25 OR 0.5 MG/DOSE,) 2 MG/1.5ML SOPN Inject 1 mg into the skin once a week.     [provider]  tacrolimus (PROGRAF) 5 MG capsule Take 5 mg by mouth 2 (two) times daily.  11/26/17   [provider]  Vitamin D, Ergocalciferol, (DRISDOL) 1.25 MG (50000 UNIT) CAPS capsule Take 50,000 Units by mouth 2 (two) times a week. 04/26/19   [provider]    Allergies    Patient has no known allergies.  Review of Systems   Review of Systems 10 systems reviewed and negative except as per HPI Physical Exam Updated Vital Signs BP 136/83   Pulse 95   Temp 98.6 F (37 C) (Oral)   Resp 15   Ht 5\' 10"  (1.778 m)   Wt 108 kg   SpO2 100%   BMI 34.15 kg/m   Physical Exam Constitutional:      Comments: Patient is alert and nontoxic.  No respiratory distress at rest.  HENT:     Head: Normocephalic and atraumatic.     Mouth/Throat:     Pharynx: Oropharynx is clear.  Eyes:     Extraocular Movements: Extraocular movements intact.  Cardiovascular:     Rate and Rhythm: Normal rate and regular rhythm.     Heart sounds: Normal heart sounds.  Pulmonary:  Effort: Pulmonary effort is normal.     Breath sounds: Normal breath sounds.  Chest:     Chest wall: No tenderness.  Abdominal:     General: There is no distension.     Palpations: Abdomen is soft.     Tenderness: There is no abdominal tenderness. There is no guarding.  Musculoskeletal:     Comments: 1+ edema bilateral lower legs.  Skin thinning consistent with chronic venous stasis.  Skin:    General: Skin is warm and dry.  Neurological:     General: No focal deficit  present.     Mental Status: He is oriented to person, place, and time.  Psychiatric:        Mood and Affect: Mood normal.     ED Results / Procedures / Treatments   Labs (all labs ordered are listed, but only abnormal results are displayed) Labs Reviewed  BASIC METABOLIC PANEL - Abnormal; Notable for the following components:      Result Value   Glucose, Bld 156 (*)    Creatinine, Ser 1.62 (*)    GFR, Estimated 42 (*)    All other components within normal limits  CBC - Abnormal; Notable for the following components:   WBC 3.6 (*)    MCHC 29.9 (*)    All other components within normal limits  TROPONIN I (HIGH SENSITIVITY)  TROPONIN I (HIGH SENSITIVITY)    EKG EKG Interpretation  Date/Time:  Sunday March 10 2020 16:16:23 EDT Ventricular Rate:  89 PR Interval:    QRS Duration: 103 QT Interval:  373 QTC Calculation: 778 R Axis:   -47 Text Interpretation: Sinus rhythm Inferior infarct, old Consider anterior infarct Lateral leads are also involved no change from previous Confirmed by Charlesetta Shanks [phone]) on 03/10/2020 5:18:35 PM   Radiology DG Chest 2 View  Result Date: 03/10/2020 CLINICAL DATA:  Chest pain and shortness of breath EXAM: CHEST - 2 VIEW COMPARISON:  12/19/2018 FINDINGS: Prominent cardiac shadow is seen. The lungs are clear. No bony abnormality is noted. IMPRESSION: No active cardiopulmonary disease. Electronically Signed   By: Inez Catalina M.D.   On: 03/10/2020 16:41    Procedures Procedures (including critical care time)  Medications Ordered in ED Medications - No data to display  ED Course  I have reviewed the triage vital signs and the nursing notes.  Pertinent labs & imaging results that were available during my care of the patient were reviewed by me and considered in my medical decision making (see chart for details).    MDM Rules/Calculators/A&P                          Patient had episode of chest pain earlier this morning.  It  persisted.  He tried nitroglycerin and eventually it resolved.  Patient is clinically well in appearance.  Vital signs are stable.  2 sets of troponins are normal.  EKG does not show acute ischemic changes.  No positives on review of systems to suggest pneumonia or PE.  At this time, I suspect patient had episode of angina with combination of anxiety and some additional exertion after slipping to the floor from his shower chair.  No apparent injury occurred.  At this time patient stable for discharge.  He has close follow-up established with cardiology.  Return precautions reviewed. Final Clinical Impression(s) / ED Diagnoses Final diagnoses:  Stable angina ([place])    Rx / DC Orders ED Discharge Orders  None       Charlesetta Shanks, MD 03/10/20 2024

## 2020-03-10 NOTE — ED Notes (Signed)
Pt states he fell in shower this morning and hit knee. Was able to get up by self. Pt states he didn't hit his head.

## 2020-03-10 NOTE — Discharge Instructions (Addendum)
1.  Call your cardiologist office in the morning to let them know you are seen in the emergency department for chest pain. 2.  Return to the emergency department if your pain is persisting, you have associated symptoms or concerning or you are getting any worse.

## 2020-03-10 NOTE — ED Notes (Signed)
Discharge instructions discussed with pt. Pt was able to verbalize understanding with no questions at this time. Pt to go home with daughter

## 2020-03-10 NOTE — ED Triage Notes (Signed)
Report received from [facility] at triage.  Pt from home.  Reports substernal and left sided chest pain since this morning with SOB and dizziness.  Took 3 NTG and daily 81mg  ASA.  Pt went straight to treatment room 1.

## 2020-03-15 DIAGNOSIS — Z79899 Other long term (current) drug therapy: Secondary | ICD-10-CM | POA: Diagnosis not present

## 2020-03-15 DIAGNOSIS — M21372 Foot drop, left foot: Secondary | ICD-10-CM | POA: Diagnosis not present

## 2020-03-15 DIAGNOSIS — Z940 Kidney transplant status: Secondary | ICD-10-CM | POA: Diagnosis not present

## 2020-03-15 DIAGNOSIS — Z7902 Long term (current) use of antithrombotics/antiplatelets: Secondary | ICD-10-CM | POA: Diagnosis not present

## 2020-03-15 DIAGNOSIS — I10 Essential (primary) hypertension: Secondary | ICD-10-CM | POA: Diagnosis not present

## 2020-03-15 DIAGNOSIS — E1142 Type 2 diabetes mellitus with diabetic polyneuropathy: Secondary | ICD-10-CM | POA: Diagnosis not present

## 2020-03-15 DIAGNOSIS — E1151 Type 2 diabetes mellitus with diabetic peripheral angiopathy without gangrene: Secondary | ICD-10-CM | POA: Diagnosis not present

## 2020-03-15 DIAGNOSIS — M5116 Intervertebral disc disorders with radiculopathy, lumbar region: Secondary | ICD-10-CM | POA: Diagnosis not present

## 2020-03-15 DIAGNOSIS — Z8673 Personal history of transient ischemic attack (TIA), and cerebral infarction without residual deficits: Secondary | ICD-10-CM | POA: Diagnosis not present

## 2020-03-15 DIAGNOSIS — Z794 Long term (current) use of insulin: Secondary | ICD-10-CM | POA: Diagnosis not present

## 2020-03-15 DIAGNOSIS — E1143 Type 2 diabetes mellitus with diabetic autonomic (poly)neuropathy: Secondary | ICD-10-CM | POA: Diagnosis not present

## 2020-03-15 DIAGNOSIS — G3184 Mild cognitive impairment, so stated: Secondary | ICD-10-CM | POA: Diagnosis not present

## 2020-03-15 DIAGNOSIS — Z9181 History of falling: Secondary | ICD-10-CM | POA: Diagnosis not present

## 2020-03-15 DIAGNOSIS — K3184 Gastroparesis: Secondary | ICD-10-CM | POA: Diagnosis not present

## 2020-03-19 NOTE — Telephone Encounter (Signed)
Hey [name] please call the company at [phone] they can help ou troubleshoot what the problem is. Unfortunately we cant troubleshoot on our end. Also if you need some more strips they can send you some.

## 2020-03-20 ENCOUNTER — Ambulatory Visit (INDEPENDENT_AMBULATORY_CARE_PROVIDER_SITE_OTHER): Payer: Medicare Other

## 2020-03-20 DIAGNOSIS — R42 Dizziness and giddiness: Secondary | ICD-10-CM | POA: Diagnosis not present

## 2020-03-21 DIAGNOSIS — Z794 Long term (current) use of insulin: Secondary | ICD-10-CM | POA: Diagnosis not present

## 2020-03-21 DIAGNOSIS — M21372 Foot drop, left foot: Secondary | ICD-10-CM | POA: Diagnosis not present

## 2020-03-21 DIAGNOSIS — E1151 Type 2 diabetes mellitus with diabetic peripheral angiopathy without gangrene: Secondary | ICD-10-CM | POA: Diagnosis not present

## 2020-03-21 DIAGNOSIS — Z940 Kidney transplant status: Secondary | ICD-10-CM | POA: Diagnosis not present

## 2020-03-21 DIAGNOSIS — K3184 Gastroparesis: Secondary | ICD-10-CM | POA: Diagnosis not present

## 2020-03-21 DIAGNOSIS — M5116 Intervertebral disc disorders with radiculopathy, lumbar region: Secondary | ICD-10-CM | POA: Diagnosis not present

## 2020-03-21 DIAGNOSIS — E1143 Type 2 diabetes mellitus with diabetic autonomic (poly)neuropathy: Secondary | ICD-10-CM | POA: Diagnosis not present

## 2020-03-21 DIAGNOSIS — G3184 Mild cognitive impairment, so stated: Secondary | ICD-10-CM | POA: Diagnosis not present

## 2020-03-21 DIAGNOSIS — Z8673 Personal history of transient ischemic attack (TIA), and cerebral infarction without residual deficits: Secondary | ICD-10-CM | POA: Diagnosis not present

## 2020-03-21 DIAGNOSIS — I10 Essential (primary) hypertension: Secondary | ICD-10-CM | POA: Diagnosis not present

## 2020-03-21 DIAGNOSIS — Z9181 History of falling: Secondary | ICD-10-CM | POA: Diagnosis not present

## 2020-03-21 DIAGNOSIS — E1142 Type 2 diabetes mellitus with diabetic polyneuropathy: Secondary | ICD-10-CM | POA: Diagnosis not present

## 2020-03-21 DIAGNOSIS — Z7902 Long term (current) use of antithrombotics/antiplatelets: Secondary | ICD-10-CM | POA: Diagnosis not present

## 2020-03-21 DIAGNOSIS — Z79899 Other long term (current) drug therapy: Secondary | ICD-10-CM | POA: Diagnosis not present

## 2020-03-22 ENCOUNTER — Telehealth: Payer: Self-pay | Admitting: Neurology

## 2020-03-22 NOTE — Telephone Encounter (Signed)
Patient called to schedule appointment. He wants to be seen ASAP. Scheduled first available in April 2022 and added to waitlist. He states he has fallen 8 times since July. He was hospitalized twice. He states he is very dizzy, he is trembling and he has these movements in his arms and hands that he can't control. Patient says if he can't see Dr Tomi Likens ASAP he might just have to go to the hospital again but he doesn't want to because "there's a bunch of sick people there". Please call.

## 2020-03-25 NOTE — Telephone Encounter (Signed)
You can schedule him for this Wednesday at 11:30

## 2020-03-26 DIAGNOSIS — Z8673 Personal history of transient ischemic attack (TIA), and cerebral infarction without residual deficits: Secondary | ICD-10-CM | POA: Diagnosis not present

## 2020-03-26 DIAGNOSIS — I10 Essential (primary) hypertension: Secondary | ICD-10-CM | POA: Diagnosis not present

## 2020-03-26 DIAGNOSIS — E1142 Type 2 diabetes mellitus with diabetic polyneuropathy: Secondary | ICD-10-CM | POA: Diagnosis not present

## 2020-03-26 DIAGNOSIS — K3184 Gastroparesis: Secondary | ICD-10-CM | POA: Diagnosis not present

## 2020-03-26 DIAGNOSIS — Z7902 Long term (current) use of antithrombotics/antiplatelets: Secondary | ICD-10-CM | POA: Diagnosis not present

## 2020-03-26 DIAGNOSIS — E1151 Type 2 diabetes mellitus with diabetic peripheral angiopathy without gangrene: Secondary | ICD-10-CM | POA: Diagnosis not present

## 2020-03-26 DIAGNOSIS — E1143 Type 2 diabetes mellitus with diabetic autonomic (poly)neuropathy: Secondary | ICD-10-CM | POA: Diagnosis not present

## 2020-03-26 DIAGNOSIS — M21372 Foot drop, left foot: Secondary | ICD-10-CM | POA: Diagnosis not present

## 2020-03-26 DIAGNOSIS — Z79899 Other long term (current) drug therapy: Secondary | ICD-10-CM | POA: Diagnosis not present

## 2020-03-26 DIAGNOSIS — G3184 Mild cognitive impairment, so stated: Secondary | ICD-10-CM | POA: Diagnosis not present

## 2020-03-26 DIAGNOSIS — Z794 Long term (current) use of insulin: Secondary | ICD-10-CM | POA: Diagnosis not present

## 2020-03-26 DIAGNOSIS — Z940 Kidney transplant status: Secondary | ICD-10-CM | POA: Diagnosis not present

## 2020-03-26 DIAGNOSIS — M5116 Intervertebral disc disorders with radiculopathy, lumbar region: Secondary | ICD-10-CM | POA: Diagnosis not present

## 2020-03-26 DIAGNOSIS — Z9181 History of falling: Secondary | ICD-10-CM | POA: Diagnosis not present

## 2020-03-26 NOTE — Progress Notes (Signed)
NEUROLOGY FOLLOW UP OFFICE NOTE  [name] [id number]  HISTORY OF PRESENT ILLNESS: [name] is a69year old right-handed man with type 2 diabetes mellitus complicated by polyneuropathy, renal failure status post transplant 2017, gastroparesis, CHF, CAD, and history of stroke who follows up for shaking spells and diabetic neuropathy.  UPDATE: Current medication: Nortriptyline 100mg  at bedtime; gabapentin 600mg /300mg /600mg   Since decreasing gabapentin, he reported increased left sided sciatic pain, requiring use of a walker, so he increased dose.  In July, he was hospitalized for fall after feeling lightheaded while reaching down for clothes in his closet and he fell onto his knees.  He was found to be orthostatic.  Cardiac workup (EKG, troponin, echo) was unremarkable.  MRI of brain personally reviewed showed mild age relalted cerebral atrhopy and chronic small vessel disease with small remote left cerebellar infarcts but no acute intracranial abnormality.  MRI of lumbar spine personally reviewed showed multifactorial degenerative changes at L5-S1 causing mild to moderate left greater than right lateral recess stenosis with moderate bilateral L5 foraminal narrowing, stable compared to previous imaging, as well as slightly increased left foraminal disc protrusion at L4-5 potentially irritating the left L4 nerve root.  Typical chest pain thought to be musculoskeletal.  He was discharged with compression stockings.  He reports fatigue and weakness.  He went to PT for low back pain and weakness.  He has known foot drop and has been to Sports Medicine and orthopedics.  He continued to have falls with spinnng and lightheadedness, sometimes seeing black spots, which has caused several falls.  It lasts for a couple of minutes, usually when standing up or changing head position.  He is currently wearing a 30 day cardiac event monitor. He has been undergoing physical therapy where he is receiving  vestibular rehab and treatment of weakness and pain involving the back and legs.  Tremors in hands are overall unchanged.  Some days worse than other days.  Over the past month, he has had 3 nightmares associated with PTSD.  Usually he has about one a year.  HISTORY: He has history lumbar radiculopathy down both hips and legs (worse on the left) since injuring his back in the [employer] many years ago. MRI of lumbar spine from 06/16/13 showed disc bulge and facet arthropathy with moderate biforaminal narrowing at L5-S1. In the past he would get epidural injections which helped for about 6 months. He has diabetic neuropathy and has numbness and pain in the feet. He also has gout in the big toes. He has problems with balance. He has some residual weakness in the right leg due to stroke. 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 He has associated numbness. In 2020, pain in left leg has started to become worse. Due to worsening lumbosacral radicular pain, gabapentin was increased and he was started on nortriptyline. He saw the orthopedist. MRI of lumbar spine from 10/07/2018 showed slight progression of degenerative disc disease with mild left-predominant multilevel neural foraminal stenosis but no spinal stenosis. He also endorsed neck pain, so MRI of cervical spine was also performed, which demonstrated mild degenerative disc disease predominantly at C5-6 with mild bilateral neural foraminal stenosis but no spinal stenosis.  He has previously endorsed myoclonus and tremor in his hands as well as leg jerking.  we attempted to transition from gabapentin to nortriptyline.  We had decreased gabapentin to 600mg  at night but he experienced increased pain so he increased back to 900mg  at night.  Nortriptyline  was titrated up to 100mg  at bedtime, which has helped with the pain.   Past medications: Tramadol 50mg ; Lyrica (aggravated PTSD);  PAST MEDICAL HISTORY: Past Medical History:   Diagnosis Date  . Allergy   . Angina   . Arthritis   . Backache, unspecified   . CHF (congestive heart failure) ([place])   . Chills   . Chronic kidney disease, stage III (moderate) (HCC)    HD T- TH-SAT  . Complication of anesthesia    01/2011 could not eat,, hospt x2, was placed on hd and cleared up  . Coronary atherosclerosis of native coronary artery   . Diabetes mellitus 2004  . Dizziness   . Dysphagia, unspecified(787.20)   . Fall at home 11/2019  . Gastroparesis   . Headache(784.0)   . Hemorrhage of rectum and anus   . Hyperlipidemia   . Hypertrophy of prostate with urinary obstruction and other lower urinary tract symptoms (LUTS)   . INTERNAL HEMORRHOIDS 10/16/2008   Qualifier: Diagnosis of  By: Nolon Rod CMA (AAMA), Robin    . Internal hemorrhoids without mention of complication   . Myocardial infarction ([place]) 2002  . Nausea alone   . Obesity   . Other dyspnea and respiratory abnormality   . Other malaise and fatigue   . Personal history of unspecified circulatory disease   . Posttraumatic stress disorder   . Prostate cancer ([place])   . Sleep apnea    USES CPAP   . Stroke [facility])    2007  . Unspecified essential hypertension    hx htn     MEDICATIONS: Current Outpatient Medications on File Prior to Visit  Medication Sig Dispense Refill  . acetaminophen (TYLENOL) 500 MG tablet Take 1,000 mg by mouth as needed for mild pain or headache.     . allopurinol (ZYLOPRIM) 100 MG tablet Take 200 mg by mouth daily.     . AMBULATORY NON FORMULARY MEDICATION Ankle Foot Orthosis  Foot Drop M21.372 Disp 1 1 each 0  . AMBULATORY NON FORMULARY MEDICATION Rolling Walker.  Foot drop M21.372 1 each 0  . aspirin 81 MG tablet Take 81 mg by mouth at bedtime.     [name] atorvastatin (LIPITOR) 40 MG tablet Take 40 mg by mouth daily.    . clopidogrel (PLAVIX) 75 MG tablet Take 75 mg by mouth daily.     . diclofenac Sodium (VOLTAREN) 1 % GEL Apply 2 g topically 4 (four) times daily. 50 g 0  .  furosemide (LASIX) 40 MG tablet Take 1 tablet (40 mg total) by mouth daily as needed for fluid. 30 tablet   . gabapentin (NEURONTIN) 300 MG capsule Take 2 capsules in morning, 1 capsule in afternoon, and 2 capsules at night. (Patient taking differently: Take 300 mg by mouth See admin instructions. Take 2 capsules (600mg ) by mouth in the morning, take 1 capsule (300mg ) by mouth midday, and take 2 capsules (600mg ) by mouth at night.) 150 capsule 5  . glucose blood (ACCU-CHEK AVIVA PLUS) test strip USE TO TEST BLOOD SUGAR 3 TIMES A DAY 300 strip 1  . insulin aspart protamine- aspart (NOVOLOG MIX 70/30) (70-30) 100 UNIT/ML injection Inject 75 Units into the skin 2 (two) times daily with a meal.     . Insulin Syringe-Needle U-100 (B-D INS SYR ULTRAFINE 1CC/31G) 31G X 5/16" 1 ML MISC Used to give insulin injections twice daily. 100 each 11  . lidocaine (LIDODERM) 5 % Place 1 patch onto the skin daily. Remove & Discard  patch within 12 hours or as directed by MD 30 patch 0  . Magnesium 200 MG TABS Take 200 mg by mouth every evening.     . mycophenolate (MYFORTIC) 180 MG EC tablet Take 180-360 mg by mouth See admin instructions. Take 2 tablets (360mg ) by mouth in the morning and 1 tablet (180mg ) by mouth at night.    . nitroGLYCERIN (NITROSTAT) 0.4 MG SL tablet PLACE 1 TABLET (0.4 MG TOTAL) UNDER THE TONGUE EVERY 5 (FIVE) MINUTES AS NEEDED FOR CHEST PAIN. 25 tablet 2  . nortriptyline (PAMELOR) 50 MG capsule TAKE 2 CAPSULES (100 MG TOTAL) BY MOUTH AT BEDTIME. 180 capsule 2  . omeprazole (PRILOSEC) 20 MG capsule Take 1 capsule (20 mg total) by mouth 2 (two) times daily before a meal. 180 capsule 3  . Semaglutide,0.25 or 0.5MG /DOS, (OZEMPIC, 0.25 OR 0.5 MG/DOSE,) 2 MG/1.5ML SOPN Inject 1 mg into the skin once a week.     . tacrolimus (PROGRAF) 5 MG capsule Take 5 mg by mouth 2 (two) times daily.     . Vitamin D, Ergocalciferol, (DRISDOL) 1.25 MG (50000 UNIT) CAPS capsule Take 50,000 Units by mouth 2 (two) times a  week.     No current facility-administered medications on file prior to visit.    ALLERGIES: No Known Allergies  FAMILY HISTORY: Family History  Problem Relation Age of Onset  . Heart disease Father   . Renal Disease Father   . Dementia Mother   . Heart attack Mother   . Diabetes Sister   . Heart disease Sister   . Diabetes Maternal Aunt   . Diabetes Maternal Uncle   . Diabetes Paternal Aunt   . Diabetes Paternal Uncle   . Heart disease Other   . Heart attack Brother   . Lung cancer Sister   . Renal Disease Brother   . Ovarian cancer Cousin   . Lung cancer Cousin   . Breast cancer Neg Hx   . Colon cancer Neg Hx   . Pancreatic cancer Neg Hx   . Prostate cancer Neg Hx     SOCIAL HISTORY: Social History   Socioeconomic History  . Marital status: Married    Spouse name: Elisha Headland  . Number of children: 3  . Years of education: Not on file  . Highest education level: Associate degree: academic program  Occupational History  . Occupation: disabled    Employer: DISABLED  Tobacco Use  . Smoking status: Never Smoker  . Smokeless tobacco: Never Used  Vaping Use  . Vaping Use: Never used  Substance and Sexual Activity  . Alcohol use: No  . Drug use: No  . Sexual activity: Not Currently  Other Topics Concern  . Not on file  Social History Narrative   Married 815. 58 year old son in 30. 1 granddaughter from [place].       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 He lives with his wife. He drinks 3-4 cups of coffee a day. He walks most every day.   Social Determinants of Health   Financial Resource Strain:   . Difficulty of Paying Living Expenses: Not on file  Food Insecurity:   . Worried About Charity fundraiser in the Last Year: Not on file  . Ran Out of Food in the Last Year: Not on file  Transportation Needs:   . Film/video editor (Medical): Not on file  . Lack of Transportation (Non-Medical): Not on file  Physical Activity:   . Days of Exercise per Week: Not on file  . Minutes of Exercise per Session: Not on file  Stress:   . Feeling of Stress : Not on file  Social Connections:   . Frequency of Communication with Friends and Family: Not on file  . Frequency of Social Gatherings with Friends and Family: Not on file  . Attends Religious Services: Not on file  . Active Member of Clubs or Organizations: Not on file  . Attends Archivist Meetings: Not on file  . Marital Status: Not on file  Intimate Partner Violence:   . Fear of Current or Ex-Partner: Not on file  . Emotionally Abused: Not on file  . Physically Abused: Not on file  . Sexually Abused: Not on file   PHYSICAL EXAM: Blood pressure (!) 143/79, pulse 97, height 5\' 10"  (1.778 m), weight 241 lb (109.3 kg), SpO2 96 %. General: No acute distress.  Patient appears well-groomed.   Head:  Normocephalic/atraumatic Eyes:  Fundi examined but not visualized Neck: supple, no paraspinal tenderness, full range of motion Heart:  Regular rate and rhythm Lungs:  Clear to auscultation bilaterally Back: No paraspinal tenderness Neurological Exam: alert and oriented to person, place, and time. Attention span and concentration intact, recent and remote memory intact, fund of knowledge intact.  Speech fluent and not dysarthric, language intact.  CN II-XII intact. Bulk and tone normal, muscle strength 5-/5 left lower extremity except 3/5 left ankle dorsiflexion.  Otherwise, 5/5 throughout.  Sensation to pinprick and vibration reduced in lower extremities up to knees.  Deep tendon reflexes absent throughout, toes downgoing.  Finger to nose testing with mild postural and kinetic tremor but no dysmetria.  Wide-based antalgic gait.  Ambulates with rolling walker.  Romberg positive.  IMPRESSION: 1.  Frequent falls  multifactorial: -  Benign paroxysmal positional vertigo -  Orthostatic hypotension -  Diabetic polyneuropathy -  Left sided lumbar radiculopathy -  Possibly gabapentin 2.  Essential tremor 3.  PTSD/nightmares.  Endorses increased frequency of nightmares.  Possibly related to nortriptyline or increased anxiety related to recent ailments.    PLAN: 1.  Continue gabapentin  2.  He wishes to continue nortriptyline 100mg  at bedtime, as it has really helped with pain at night.  He will monitor his nightmares and if they do not improve or if they increase, I recommended that he contact me and we would decrease dose back to 50mg  or discontinue all together.  If that will be the case, I would refer to pain management. 3.  Continue physical therapy/vestibular rehab 4.  Follow up in April as scheduled.  Ronald Clines, DO  CC:  Garret Reddish, MD

## 2020-03-27 ENCOUNTER — Ambulatory Visit (INDEPENDENT_AMBULATORY_CARE_PROVIDER_SITE_OTHER): Payer: Medicare Other | Admitting: Neurology

## 2020-03-27 ENCOUNTER — Other Ambulatory Visit: Payer: Self-pay

## 2020-03-27 ENCOUNTER — Encounter: Payer: Self-pay | Admitting: Neurology

## 2020-03-27 VITALS — BP 143/79 | HR 97 | Ht 70.0 in | Wt 241.0 lb

## 2020-03-27 DIAGNOSIS — R251 Tremor, unspecified: Secondary | ICD-10-CM | POA: Diagnosis not present

## 2020-03-27 DIAGNOSIS — E1142 Type 2 diabetes mellitus with diabetic polyneuropathy: Secondary | ICD-10-CM | POA: Diagnosis not present

## 2020-03-27 DIAGNOSIS — M5416 Radiculopathy, lumbar region: Secondary | ICD-10-CM

## 2020-03-27 DIAGNOSIS — H8110 Benign paroxysmal vertigo, unspecified ear: Secondary | ICD-10-CM | POA: Diagnosis not present

## 2020-03-27 DIAGNOSIS — I951 Orthostatic hypotension: Secondary | ICD-10-CM

## 2020-03-27 DIAGNOSIS — F515 Nightmare disorder: Secondary | ICD-10-CM

## 2020-03-27 NOTE — Patient Instructions (Signed)
1.  No change in medications at this time.  If nightmares continue, I think we will need to decrease nortriptyline. 2.  Continue physical therapy 3.  Follow up in April as scheduled.

## 2020-03-28 DIAGNOSIS — Z940 Kidney transplant status: Secondary | ICD-10-CM | POA: Diagnosis not present

## 2020-03-28 DIAGNOSIS — Z8673 Personal history of transient ischemic attack (TIA), and cerebral infarction without residual deficits: Secondary | ICD-10-CM | POA: Diagnosis not present

## 2020-03-28 DIAGNOSIS — E1142 Type 2 diabetes mellitus with diabetic polyneuropathy: Secondary | ICD-10-CM | POA: Diagnosis not present

## 2020-03-28 DIAGNOSIS — E1143 Type 2 diabetes mellitus with diabetic autonomic (poly)neuropathy: Secondary | ICD-10-CM | POA: Diagnosis not present

## 2020-03-28 DIAGNOSIS — Z7902 Long term (current) use of antithrombotics/antiplatelets: Secondary | ICD-10-CM | POA: Diagnosis not present

## 2020-03-28 DIAGNOSIS — Z794 Long term (current) use of insulin: Secondary | ICD-10-CM | POA: Diagnosis not present

## 2020-03-28 DIAGNOSIS — G3184 Mild cognitive impairment, so stated: Secondary | ICD-10-CM | POA: Diagnosis not present

## 2020-03-28 DIAGNOSIS — M21372 Foot drop, left foot: Secondary | ICD-10-CM | POA: Diagnosis not present

## 2020-03-28 DIAGNOSIS — Z9181 History of falling: Secondary | ICD-10-CM | POA: Diagnosis not present

## 2020-03-28 DIAGNOSIS — I10 Essential (primary) hypertension: Secondary | ICD-10-CM | POA: Diagnosis not present

## 2020-03-28 DIAGNOSIS — K3184 Gastroparesis: Secondary | ICD-10-CM | POA: Diagnosis not present

## 2020-03-28 DIAGNOSIS — M5116 Intervertebral disc disorders with radiculopathy, lumbar region: Secondary | ICD-10-CM | POA: Diagnosis not present

## 2020-03-28 DIAGNOSIS — Z79899 Other long term (current) drug therapy: Secondary | ICD-10-CM | POA: Diagnosis not present

## 2020-03-28 DIAGNOSIS — E1151 Type 2 diabetes mellitus with diabetic peripheral angiopathy without gangrene: Secondary | ICD-10-CM | POA: Diagnosis not present

## 2020-03-29 DIAGNOSIS — E118 Type 2 diabetes mellitus with unspecified complications: Secondary | ICD-10-CM | POA: Diagnosis not present

## 2020-04-01 ENCOUNTER — Encounter: Payer: Self-pay | Admitting: Podiatry

## 2020-04-03 NOTE — Progress Notes (Deleted)
   I, Peterson Lombard, LAT, ATC acting as a scribe for Lynne Leader, MD.  [name] is a 69 y.o. male who presents to [place] at [facility] today for f/u of L foot drop and lumbar radiculopathy. Pt was last seen by Dr. Georgina Snell on 03/04/20 and was advised to begin home health physical therapy, ankle-foot orthosis and prescribed a new rolling walker. Since his last visit, pt reports   Dx imaging: L-spine MRI- 12/19/19, 10/07/18; L-spine XR- 09/30/18  Pertinent review of systems: ***  Relevant historical information: ***   Exam:  There were no vitals taken for this visit. General: Well Developed, well nourished, and in no acute distress.   MSK: ***    Lab and Radiology Results No results found for this or any previous visit (from the past 72 hour(s)). No results found.     Assessment and Plan: 69 y.o. male with ***   PDMP not reviewed this encounter. No orders of the defined types were placed in this encounter.  No orders of the defined types were placed in this encounter.    Discussed warning signs or symptoms. Please see discharge instructions. Patient expresses understanding.   ***

## 2020-04-04 ENCOUNTER — Ambulatory Visit: Payer: Medicare Other | Admitting: Family Medicine

## 2020-04-04 NOTE — Telephone Encounter (Signed)
Please schedule, thanks.

## 2020-04-05 DIAGNOSIS — Z940 Kidney transplant status: Secondary | ICD-10-CM | POA: Diagnosis not present

## 2020-04-05 DIAGNOSIS — Z9181 History of falling: Secondary | ICD-10-CM | POA: Diagnosis not present

## 2020-04-05 DIAGNOSIS — Z7902 Long term (current) use of antithrombotics/antiplatelets: Secondary | ICD-10-CM | POA: Diagnosis not present

## 2020-04-05 DIAGNOSIS — M21372 Foot drop, left foot: Secondary | ICD-10-CM | POA: Diagnosis not present

## 2020-04-05 DIAGNOSIS — I10 Essential (primary) hypertension: Secondary | ICD-10-CM | POA: Diagnosis not present

## 2020-04-05 DIAGNOSIS — E1143 Type 2 diabetes mellitus with diabetic autonomic (poly)neuropathy: Secondary | ICD-10-CM | POA: Diagnosis not present

## 2020-04-05 DIAGNOSIS — K3184 Gastroparesis: Secondary | ICD-10-CM | POA: Diagnosis not present

## 2020-04-05 DIAGNOSIS — Z8673 Personal history of transient ischemic attack (TIA), and cerebral infarction without residual deficits: Secondary | ICD-10-CM | POA: Diagnosis not present

## 2020-04-05 DIAGNOSIS — M5116 Intervertebral disc disorders with radiculopathy, lumbar region: Secondary | ICD-10-CM | POA: Diagnosis not present

## 2020-04-05 DIAGNOSIS — Z794 Long term (current) use of insulin: Secondary | ICD-10-CM | POA: Diagnosis not present

## 2020-04-05 DIAGNOSIS — Z79899 Other long term (current) drug therapy: Secondary | ICD-10-CM | POA: Diagnosis not present

## 2020-04-05 DIAGNOSIS — G3184 Mild cognitive impairment, so stated: Secondary | ICD-10-CM | POA: Diagnosis not present

## 2020-04-05 DIAGNOSIS — E1142 Type 2 diabetes mellitus with diabetic polyneuropathy: Secondary | ICD-10-CM | POA: Diagnosis not present

## 2020-04-05 DIAGNOSIS — E1151 Type 2 diabetes mellitus with diabetic peripheral angiopathy without gangrene: Secondary | ICD-10-CM | POA: Diagnosis not present

## 2020-04-08 ENCOUNTER — Ambulatory Visit (INDEPENDENT_AMBULATORY_CARE_PROVIDER_SITE_OTHER): Payer: Medicare Other | Admitting: Otolaryngology

## 2020-04-08 ENCOUNTER — Encounter (INDEPENDENT_AMBULATORY_CARE_PROVIDER_SITE_OTHER): Payer: Self-pay | Admitting: Otolaryngology

## 2020-04-08 ENCOUNTER — Other Ambulatory Visit: Payer: Self-pay

## 2020-04-08 VITALS — Temp 96.3°F

## 2020-04-08 DIAGNOSIS — R42 Dizziness and giddiness: Secondary | ICD-10-CM

## 2020-04-08 DIAGNOSIS — R1312 Dysphagia, oropharyngeal phase: Secondary | ICD-10-CM

## 2020-04-08 DIAGNOSIS — M542 Cervicalgia: Secondary | ICD-10-CM | POA: Diagnosis not present

## 2020-04-08 NOTE — Progress Notes (Signed)
HPI: [name] is a 69 y.o. male who presents is referred by Dr. Yong Channel for evaluation of right neck pain when he swallows.  He has had this for a couple of years ever since he had a hernia operation performed about 3 to 4 years ago.  He states that he thought that they used a too large of tube to intubate him and that he has had intermittent pain in the right neck when he swallows that comes and goes.  He feels like it is a muscle in his neck. He has also had problems with dizziness which he states is fairly chronic and refers to this as vertigo although is not really having spinning sensation.  He has not noted any hearing problems.  He has seen neurology and undergoing vestibular rehab. He does not smoke.  Past Medical History:  Diagnosis Date  . Allergy   . Angina   . Arthritis   . Backache, unspecified   . CHF (congestive heart failure) ([place])   . Chills   . Chronic kidney disease, stage III (moderate) (HCC)    HD T- TH-SAT  . Complication of anesthesia    01/2011 could not eat,, hospt x2, was placed on hd and cleared up  . Coronary atherosclerosis of native coronary artery   . Diabetes mellitus 2004  . Dizziness   . Dysphagia, unspecified(787.20)   . Fall at home 11/2019  . Gastroparesis   . Headache(784.0)   . Hemorrhage of rectum and anus   . Hyperlipidemia   . Hypertrophy of prostate with urinary obstruction and other lower urinary tract symptoms (LUTS)   . INTERNAL HEMORRHOIDS 10/16/2008   Qualifier: Diagnosis of  By: Nolon Rod CMA (AAMA), Robin    . Internal hemorrhoids without mention of complication   . Myocardial infarction ([place]) 2002  . Nausea alone   . Obesity   . Other dyspnea and respiratory abnormality   . Other malaise and fatigue   . Personal history of unspecified circulatory disease   . Posttraumatic stress disorder   . Prostate cancer ([place])   . Sleep apnea    USES CPAP   . Stroke [facility])    2007  . Unspecified essential hypertension    hx htn     Past Surgical History:  Procedure Laterality Date  . ANAL FISSURECTOMY    . AV FISTULA PLACEMENT    . CARDIAC CATHETERIZATION     2005 DR BRODIE  . HEMORRHOID SURGERY    . 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 PROSTATE BIOPSY    . revision of fistula     renal failure  . VENTRAL HERNIA REPAIR  07/01/2011   Procedure: HERNIA REPAIR VENTRAL ADULT;  Surgeon: Joyice Faster. Cornett, MD;  Location: [place] OR;  Service: General;  Laterality: N/A;   Social History   Socioeconomic History  . Marital status: Married    Spouse name: Elisha Headland  . Number of children: 3  . Years of education: Not on file  . Highest education level: Associate degree: academic program  Occupational History  . Occupation: disabled    Employer: DISABLED  Tobacco Use  . Smoking status: Never Smoker  . Smokeless tobacco: Never Used  Vaping Use  . Vaping Use: Never used  Substance and Sexual Activity  . Alcohol use: No  . Drug use: No  . Sexual activity: Not Currently  Other Topics Concern  . Not on file  Social History Narrative   Married [id number]. 42 year old son in  2015. 1 granddaughter from [place].       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 He lives with his wife. He drinks 3-4 cups of coffee a day. He walks most every day.   Social Determinants of Health   Financial Resource Strain:   . Difficulty of Paying Living Expenses: Not on file  Food Insecurity:   . Worried About Charity fundraiser in the Last Year: Not on file  . Ran Out of Food in the Last Year: Not on file  Transportation Needs:   . Lack of Transportation (Medical): Not on file  . Lack of Transportation (Non-Medical): Not on file  Physical Activity:   . Days of Exercise per Week: Not on file  . Minutes of Exercise per Session: Not on file  Stress:   . Feeling of Stress : Not on file   Social Connections:   . Frequency of Communication with Friends and Family: Not on file  . Frequency of Social Gatherings with Friends and Family: Not on file  . Attends Religious Services: Not on file  . Active Member of Clubs or Organizations: Not on file  . Attends Archivist Meetings: Not on file  . Marital Status: Not on file   Family History  Problem Relation Age of Onset  . Heart disease Father   . Renal Disease Father   . Dementia Mother   . Heart attack Mother   . Diabetes Sister   . Heart disease Sister   . Diabetes Maternal Aunt   . Diabetes Maternal Uncle   . Diabetes Paternal Aunt   . Diabetes Paternal Uncle   . Heart disease Other   . Heart attack Brother   . Lung cancer Sister   . Renal Disease Brother   . Ovarian cancer Cousin   . Lung cancer Cousin   . Breast cancer Neg Hx   . Colon cancer Neg Hx   . Pancreatic cancer Neg Hx   . Prostate cancer Neg Hx    No Known Allergies Prior to Admission medications   Medication Sig Start Date End Date Taking? Authorizing Provider  acetaminophen (TYLENOL) 500 MG tablet Take 1,000 mg by mouth as needed for mild pain or headache.    Yes [provider]  allopurinol (ZYLOPRIM) 100 MG tablet Take 200 mg by mouth daily.    Yes [provider]  AMBULATORY NON FORMULARY MEDICATION Ankle Foot Orthosis  Foot Drop M21.372 Disp 1 03/04/20  Yes Gregor Hams, MD  AMBULATORY NON FORMULARY MEDICATION Rolling Walker.  Foot drop M21.372 03/04/20  Yes Gregor Hams, MD  aspirin 81 MG tablet Take 81 mg by mouth at bedtime.    Yes [provider]  atorvastatin (LIPITOR) 40 MG tablet Take 40 mg by mouth daily.   Yes [provider]  clopidogrel (PLAVIX) 75 MG tablet Take 75 mg by mouth daily.    Yes [provider]  diclofenac Sodium (VOLTAREN) 1 % GEL Apply 2 g topically 4 (four) times daily. 12/21/19  Yes Vann, Jessica U, DO  furosemide (LASIX) 40 MG tablet Take 1 tablet (40 mg  total) by mouth daily as needed for fluid. 12/22/19  Yes Vann, Jessica U, DO  gabapentin (NEURONTIN) 300 MG capsule Take 2 capsules in morning, 1 capsule in afternoon, and 2  capsules at night. Patient taking differently: Take 300 mg by mouth See admin instructions. Take 2 capsules (600mg ) by mouth in the morning, take 1 capsule (300mg ) by mouth midday, and take 2 capsules (600mg ) by mouth at night. 08/09/19  Yes Jaffe, Adam R, DO  glucose blood (ACCU-CHEK AVIVA PLUS) test strip USE TO TEST BLOOD SUGAR 3 TIMES A DAY 05/09/19  Yes [name], MD  insulin aspart protamine- aspart (NOVOLOG MIX 70/30) (70-30) 100 UNIT/ML injection Inject 75 Units into the skin 2 (two) times daily with a meal.    Yes [provider]  Insulin Syringe-Needle U-100 (B-D INS SYR ULTRAFINE 1CC/31G) 31G X 5/16" 1 ML MISC Used to give insulin injections twice daily. 01/10/20  Yes [name], MD  lidocaine (LIDODERM) 5 % Place 1 patch onto the skin daily. Remove & Discard patch within 12 hours or as directed by MD 12/22/19  Yes Geradine Girt, DO  Magnesium 200 MG TABS Take 200 mg by mouth every evening.    Yes [provider]  mycophenolate (MYFORTIC) 180 MG EC tablet Take 180-360 mg by mouth See admin instructions. Take 2 tablets (360mg ) by mouth in the morning and 1 tablet (180mg ) by mouth at night.   Yes [provider]  nitroGLYCERIN (NITROSTAT) 0.4 MG SL tablet PLACE 1 TABLET (0.4 MG TOTAL) UNDER THE TONGUE EVERY 5 (FIVE) MINUTES AS NEEDED FOR CHEST PAIN. 01/25/18  Yes Skeet Latch, MD  nortriptyline (PAMELOR) 50 MG capsule TAKE 2 CAPSULES (100 MG TOTAL) BY MOUTH AT BEDTIME. 01/01/20  Yes Jaffe, Adam R, DO  omeprazole (PRILOSEC) 20 MG capsule Take 1 capsule (20 mg total) by mouth 2 (two) times daily before a meal. 03/01/19  Yes [name], MD  Semaglutide,0.25 or 0.5MG /DOS, (OZEMPIC, 0.25 OR 0.5 MG/DOSE,) 2 MG/1.5ML SOPN Inject 1 mg into the skin once a week.    Yes [provider]  tacrolimus (PROGRAF) 5 MG capsule Take 5 mg by mouth 2 (two) times daily.  11/26/17  Yes [provider]  Vitamin D, Ergocalciferol, (DRISDOL) 1.25 MG (50000 UNIT) CAPS capsule Take 50,000 Units by mouth 2 (two) times a week. 04/26/19  Yes [provider]     Positive ROS: Otherwise negative  All other systems have been reviewed and were otherwise negative with the exception of those mentioned in the HPI and as above.  Physical Exam: Constitutional: Alert, well-appearing, no acute distress.  Patient has no hoarseness. Ears: External ears without lesions or tenderness. Ear canals are clear bilaterally.  TMs are clear bilaterally with good mobility on pneumatic otoscopy.  Hearing screening with a 512 1024 tuning fork revealed minimal hearing loss with a 1024 tuning fork. Nasal: External nose without lesions. Septum midline.. Clear nasal passages bilaterally. Oral: Lips and gums without lesions. Tongue and palate mucosa without lesions. Posterior oropharynx clear.  Floor of mouth is normal on examination with clear drainage from the submandibular ducts.  The area of discomfort he points to is in the region of the right semitubular gland.  On bimanual palpation of this this is normal to about palpation with no swelling and no signs of inflammation.  He has no adenopathy in the neck. Palpation of the base of tongue was soft. Fiberoptic laryngoscopy through the left nostril revealed a clear nasopharynx.  The base of tongue vallecula and epiglottis were normal.  AE folds were normal piriform sinuses were clear vocal cords were clear with normal vocal cord mobility. Neck: No palpable  adenopathy or masses. No palpable adenopathy in the neck. No enlargement of the right semitubular gland. Respiratory: Breathing comfortably  Skin: No facial/neck lesions or rash noted.  Laryngoscopy  Date/Time: 04/08/2020 1:31 PM Performed by: Rozetta Nunnery, MD Authorized by:  Rozetta Nunnery, MD   Consent:    Consent obtained:  Verbal   Consent given by:  Patient Procedure details:    Indications: direct visualization of the upper aerodigestive tract     Medication:  Afrin   Instrument: flexible fiberoptic laryngoscope     Scope location: left nare   Sinus:    Left nasopharynx: normal   Mouth:    Oropharynx: normal     Vallecula: normal     Base of tongue: normal     Epiglottis: normal   Throat:    Pyriform sinus: normal     True vocal cords: normal   Comments:     On fiberoptic laryngoscopy the hypopharynx and larynx were clear to evaluation.    Assessment: Neck and throat pain questionable etiology.  Normal upper airway examination on fiberoptic laryngoscopy with no evidence of infection or neoplasm.  Normal anatomy. Dizziness questionable etiology.  Plan: Reassured him of normal upper airway examination with no evidence of neoplasm or infection. Treating this would be limited as to what the patient does to make it better or worse.  Did not prescribe any specific medication. For the dizziness no evidence of acute ear or inner ear abnormality on clinical exam today.  I agree with treatment with vestibular rehab.   Radene Journey, MD   CC:

## 2020-04-09 ENCOUNTER — Telehealth: Payer: Self-pay | Admitting: Neurology

## 2020-04-09 DIAGNOSIS — E1143 Type 2 diabetes mellitus with diabetic autonomic (poly)neuropathy: Secondary | ICD-10-CM | POA: Diagnosis not present

## 2020-04-09 DIAGNOSIS — Z9181 History of falling: Secondary | ICD-10-CM | POA: Diagnosis not present

## 2020-04-09 DIAGNOSIS — Z8673 Personal history of transient ischemic attack (TIA), and cerebral infarction without residual deficits: Secondary | ICD-10-CM | POA: Diagnosis not present

## 2020-04-09 DIAGNOSIS — E1142 Type 2 diabetes mellitus with diabetic polyneuropathy: Secondary | ICD-10-CM | POA: Diagnosis not present

## 2020-04-09 DIAGNOSIS — G3184 Mild cognitive impairment, so stated: Secondary | ICD-10-CM | POA: Diagnosis not present

## 2020-04-09 DIAGNOSIS — Z940 Kidney transplant status: Secondary | ICD-10-CM | POA: Diagnosis not present

## 2020-04-09 DIAGNOSIS — Z79899 Other long term (current) drug therapy: Secondary | ICD-10-CM | POA: Diagnosis not present

## 2020-04-09 DIAGNOSIS — Z794 Long term (current) use of insulin: Secondary | ICD-10-CM | POA: Diagnosis not present

## 2020-04-09 DIAGNOSIS — K3184 Gastroparesis: Secondary | ICD-10-CM | POA: Diagnosis not present

## 2020-04-09 DIAGNOSIS — G450 Vertebro-basilar artery syndrome: Secondary | ICD-10-CM

## 2020-04-09 DIAGNOSIS — Z7902 Long term (current) use of antithrombotics/antiplatelets: Secondary | ICD-10-CM | POA: Diagnosis not present

## 2020-04-09 DIAGNOSIS — I10 Essential (primary) hypertension: Secondary | ICD-10-CM | POA: Diagnosis not present

## 2020-04-09 DIAGNOSIS — M21372 Foot drop, left foot: Secondary | ICD-10-CM | POA: Diagnosis not present

## 2020-04-09 DIAGNOSIS — M5116 Intervertebral disc disorders with radiculopathy, lumbar region: Secondary | ICD-10-CM | POA: Diagnosis not present

## 2020-04-09 DIAGNOSIS — E1151 Type 2 diabetes mellitus with diabetic peripheral angiopathy without gangrene: Secondary | ICD-10-CM | POA: Diagnosis not present

## 2020-04-09 NOTE — Telephone Encounter (Signed)
Patient called and said, "I've been to the ENT and am still having trouble with dizziness and falls. I need Dr. Tomi Likens to order some tests to help get to the bottom of this. It is effecting every part of my life."

## 2020-04-10 ENCOUNTER — Ambulatory Visit (INDEPENDENT_AMBULATORY_CARE_PROVIDER_SITE_OTHER): Payer: Medicare Other | Admitting: Family Medicine

## 2020-04-10 ENCOUNTER — Encounter: Payer: Self-pay | Admitting: Family Medicine

## 2020-04-10 ENCOUNTER — Ambulatory Visit (INDEPENDENT_AMBULATORY_CARE_PROVIDER_SITE_OTHER): Payer: Medicare Other

## 2020-04-10 ENCOUNTER — Other Ambulatory Visit: Payer: Self-pay

## 2020-04-10 VITALS — BP 144/88 | HR 97 | Ht 70.0 in | Wt 242.6 lb

## 2020-04-10 DIAGNOSIS — M5416 Radiculopathy, lumbar region: Secondary | ICD-10-CM | POA: Diagnosis not present

## 2020-04-10 DIAGNOSIS — M21372 Foot drop, left foot: Secondary | ICD-10-CM

## 2020-04-10 DIAGNOSIS — M25511 Pain in right shoulder: Secondary | ICD-10-CM

## 2020-04-10 NOTE — Telephone Encounter (Signed)
We can check an MRA of head to evaluate for vertebrobasilar insufficiency

## 2020-04-10 NOTE — Patient Instructions (Signed)
Thank you for coming in today.  [facility]  [phone] Call them and bring the Rx for the AFO.   Please call [facility] at [phone] to schedule your spine injection.  You will need to stop the aspirin and plavix for 1 week prior to injection.   Continue with home health PT>    Recheck in 1 month.  I can do a cortisone shot in the shoulder if needed.

## 2020-04-10 NOTE — Progress Notes (Signed)
I, Wendy Poet, LAT, ATC, am serving as scribe for Dr. Lynne Leader.  [name] is a 69 y.o. male who presents to [place] at [facility] today for f/u of LBP and L foot drop.  He was last seen by Dr. Georgina Snell for initial evaluation on 03/04/20 and was referred for an AFO and was written a prescription for a new RW.  He was also referred to home health PT.  Since his last visit, pt reports back pn is OK, c/o slight soreness from PT he completed yesterday. L leg is over major concern, he c/o no control over L lower leg and weakness.  R shoulder pn- c/o constant pn.  This is the shoulder that he landed on when he fell and it's still bothering him. Pt was worried he never got it checked out. Pn over lateral asepct.  Diagnostic imaging: L-spine MRI- 12/19/19, 10/07/18  Pertinent review of systems: No fevers or chills  Relevant historical information: Diabetes.  On aspirin and Plavix also.  Patient notes that he would rather die than have back surgery   Exam:  BP (!) 144/88 (BP Location: Right Arm, Patient Position: Sitting, Cuff Size: Normal)   Pulse 97   Ht 5\' 10"  (1.778 m)   Wt 242 lb 9.6 oz (110 kg)   SpO2 97%   BMI 34.81 kg/m  General: Well Developed, well nourished, and in no acute distress.   MSK: Right shoulder normal-appearing nontender decreased motion to abduction and external rotation and internal rotation.  Strength slightly diminished abduction 4/5.  Intact external and internal rotation.  Negative Hawkins and Neer's test.  Persistent left foot drop.    Lab and Radiology Results  X-ray images right shoulder obtained today personally and independently interpreted. No acute fractures or severe degeneration. [name] Await formal radiology review  EXAM: MRI LUMBAR SPINE WITHOUT CONTRAST  TECHNIQUE: Multiplanar, multisequence MR imaging of the lumbar spine was performed. No intravenous contrast was administered.  COMPARISON:  Prior MRI from  10/07/2018.  FINDINGS: Segmentation: Standard. Lowest well-formed disc space labeled the L5-S1 level.  Alignment: 4 mm retrolisthesis of L5 on S1, stable. Associated trace anterolisthesis of L4 on L5, also unchanged. Alignment otherwise normal with preservation of the normal lumbar lordosis.  Vertebrae: Vertebral body height maintained without evidence for acute or chronic fracture. Prominent chronic endplate Schmorl's node deformity noted at the superior endplate of L3, stable. Post radiation changes seen within the visualized bone marrow extending from the L5 level inferiorly into the sacrum. Elsewhere, visualized bone marrow signal intensity is mildly heterogeneous but overall within normal limits. Few scattered benign hemangiomata noted. No worrisome osseous lesions. No abnormal marrow edema.  Conus medullaris and cauda equina: Conus extends to the L1 level. Conus and cauda equina appear normal.  Paraspinal and other soft tissues: Paraspinous soft tissues demonstrate no acute finding. Bilateral renal atrophy with multiple scattered cysts noted bilaterally, largest of which measures 2 cm at the interpolar right kidney. A few of these are mildly complex in appearance, likely reflecting proteinaceous and/or hemorrhagic cyst as seen on prior abdominal MRI. Prominent distension of the partially visualized urinary bladder.  Disc levels:  L1-2: Negative interspace. Mild facet hypertrophy. No canal or foraminal stenosis.  L2-3: Mild diffuse disc bulge with disc desiccation. Disc bulging asymmetric to the left. Prominent endplate Schmorl's node deformity noted at the superior endplate of L3. Mild to moderate facet and ligament flavum hypertrophy. No significant spinal stenosis. Mild left L2 foraminal narrowing, unchanged.  No significant right foraminal encroachment.  L3-4: Disc desiccation with minimal disc bulge, slightly asymmetric to the left. Mild to moderate facet  and ligament flavum hypertrophy. No significant spinal stenosis. Mild left L3 foraminal narrowing, stable. No significant right foraminal encroachment.  L4-5: Mild diffuse disc bulge with disc desiccation. Superimposed small left foraminal disc protrusion is seen, slightly more prominent and focal in nature as compared to previous (series 8, image 27). This closely approximates the exiting left L4 nerve root. Moderate facet and ligament flavum hypertrophy with associated trace joint effusions. Resultant mild spinal stenosis, relatively unchanged. Mild left greater than right L4 foraminal narrowing, similar.  L5-S1: Trace retrolisthesis. Diffuse disc bulge with disc desiccation and intervertebral disc space narrowing. Mild reactive endplate spurring. Moderate bilateral facet hypertrophy. Resultant mild to moderate left greater than right lateral recess stenosis, descending S1 nerve root level. Moderate bilateral L5 foraminal narrowing, unchanged.  IMPRESSION: 1. No acute abnormality within the lumbar spine. 2. Small left foraminal disc protrusion at L4-5, closely approximating and potentially irritating the exiting left L4 nerve root. This is slightly increased in size/prominence as compared to previous. 3. Multifactorial degenerative changes at L5-S1, resulting in mild to moderate left greater than right lateral recess stenosis, with moderate bilateral L5 foraminal narrowing. Overall appearance is stable from previous. 4. Mild left eccentric disc bulge and facet hypertrophy at L2-3 and L3-4 with resultant mild left L2 and L3 foraminal stenosis, similar to prior.   Electronically Signed   By: Jeannine Boga M.D.   On: 12/19/2019 04:01  I, Lynne Leader, personally (independently) visualized and performed the interpretation of the images attached in this note.   Assessment and Plan: 69 y.o. male with left foot drop and left leg pain.  This corresponds to the L4 of  nerve compression seen on MRI December 19, 2019.  Patient is still in the process of getting his AFO.  Clarified location to purchase AFO.  However discussed more treatment options.  At this point he is willing to consider epidural steroid injection.  This is a change since his last visit.  As result I did order epidural steroid injection.  Advised that he should stop aspirin and Plavix 1 week prior to injection.  Also advised that his blood sugar is likely to increase after injection.  Recheck back in a month.  Shoulder pain after fall several months ago.  X-ray more benign today.  Add shoulder exercises to existing home health PT.  Check back in a month consider injection or more diagnostic ultrasound of point.   PDMP not reviewed this encounter. Orders Placed This Encounter  Procedures  . DG Shoulder Right    Standing Status:   Future    Number of Occurrences:   1    Standing Expiration Date:   04/10/2021    Order Specific Question:   Reason for Exam (SYMPTOM  OR DIAGNOSIS REQUIRED)    Answer:   Right shoulder pain    Order Specific Question:   Preferred imaging location?    Answer:   [facility]  . DG INJECT DIAG/THERA/INC NEEDLE/CATH/PLC EPI/LUMB/SAC W/IMG    Standing Status:   Future    Standing Expiration Date:   04/10/2021    Order Specific Question:   Reason for Exam (SYMPTOM  OR DIAGNOSIS REQUIRED)    Answer:   Left foot drop and L4 pain. Level and technique per radiology    Order Specific Question:   Preferred Imaging Location?    Answer:  [id number]    Order Specific Question:   Radiology Contrast Protocol - do NOT remove file path    Answer:   [email or web address]   No orders of the defined types were placed in this encounter.    Discussed warning signs or symptoms. Please see discharge instructions. Patient expresses understanding.   The above documentation has been reviewed and is accurate and complete Lynne Leader,  M.D.

## 2020-04-11 ENCOUNTER — Other Ambulatory Visit: Payer: Self-pay | Admitting: Family Medicine

## 2020-04-11 DIAGNOSIS — G3184 Mild cognitive impairment, so stated: Secondary | ICD-10-CM | POA: Diagnosis not present

## 2020-04-11 DIAGNOSIS — Z940 Kidney transplant status: Secondary | ICD-10-CM | POA: Diagnosis not present

## 2020-04-11 DIAGNOSIS — M5116 Intervertebral disc disorders with radiculopathy, lumbar region: Secondary | ICD-10-CM | POA: Diagnosis not present

## 2020-04-11 DIAGNOSIS — Z9181 History of falling: Secondary | ICD-10-CM | POA: Diagnosis not present

## 2020-04-11 DIAGNOSIS — E1143 Type 2 diabetes mellitus with diabetic autonomic (poly)neuropathy: Secondary | ICD-10-CM | POA: Diagnosis not present

## 2020-04-11 DIAGNOSIS — Z8673 Personal history of transient ischemic attack (TIA), and cerebral infarction without residual deficits: Secondary | ICD-10-CM | POA: Diagnosis not present

## 2020-04-11 DIAGNOSIS — E1151 Type 2 diabetes mellitus with diabetic peripheral angiopathy without gangrene: Secondary | ICD-10-CM | POA: Diagnosis not present

## 2020-04-11 DIAGNOSIS — Z79899 Other long term (current) drug therapy: Secondary | ICD-10-CM | POA: Diagnosis not present

## 2020-04-11 DIAGNOSIS — I10 Essential (primary) hypertension: Secondary | ICD-10-CM | POA: Diagnosis not present

## 2020-04-11 DIAGNOSIS — M21372 Foot drop, left foot: Secondary | ICD-10-CM | POA: Diagnosis not present

## 2020-04-11 DIAGNOSIS — Z794 Long term (current) use of insulin: Secondary | ICD-10-CM | POA: Diagnosis not present

## 2020-04-11 DIAGNOSIS — K3184 Gastroparesis: Secondary | ICD-10-CM | POA: Diagnosis not present

## 2020-04-11 DIAGNOSIS — E1142 Type 2 diabetes mellitus with diabetic polyneuropathy: Secondary | ICD-10-CM | POA: Diagnosis not present

## 2020-04-11 DIAGNOSIS — Z7902 Long term (current) use of antithrombotics/antiplatelets: Secondary | ICD-10-CM | POA: Diagnosis not present

## 2020-04-12 NOTE — Progress Notes (Signed)
X-ray right shoulder looks normal to radiology

## 2020-04-16 ENCOUNTER — Telehealth: Payer: Self-pay

## 2020-04-16 DIAGNOSIS — M21372 Foot drop, left foot: Secondary | ICD-10-CM | POA: Diagnosis not present

## 2020-04-16 DIAGNOSIS — G3184 Mild cognitive impairment, so stated: Secondary | ICD-10-CM | POA: Diagnosis not present

## 2020-04-16 DIAGNOSIS — Z9181 History of falling: Secondary | ICD-10-CM | POA: Diagnosis not present

## 2020-04-16 DIAGNOSIS — Z79899 Other long term (current) drug therapy: Secondary | ICD-10-CM | POA: Diagnosis not present

## 2020-04-16 DIAGNOSIS — E1143 Type 2 diabetes mellitus with diabetic autonomic (poly)neuropathy: Secondary | ICD-10-CM | POA: Diagnosis not present

## 2020-04-16 DIAGNOSIS — K3184 Gastroparesis: Secondary | ICD-10-CM | POA: Diagnosis not present

## 2020-04-16 DIAGNOSIS — E1142 Type 2 diabetes mellitus with diabetic polyneuropathy: Secondary | ICD-10-CM | POA: Diagnosis not present

## 2020-04-16 DIAGNOSIS — Z7902 Long term (current) use of antithrombotics/antiplatelets: Secondary | ICD-10-CM | POA: Diagnosis not present

## 2020-04-16 DIAGNOSIS — Z8673 Personal history of transient ischemic attack (TIA), and cerebral infarction without residual deficits: Secondary | ICD-10-CM | POA: Diagnosis not present

## 2020-04-16 DIAGNOSIS — Z940 Kidney transplant status: Secondary | ICD-10-CM | POA: Diagnosis not present

## 2020-04-16 DIAGNOSIS — E1151 Type 2 diabetes mellitus with diabetic peripheral angiopathy without gangrene: Secondary | ICD-10-CM | POA: Diagnosis not present

## 2020-04-16 DIAGNOSIS — M5116 Intervertebral disc disorders with radiculopathy, lumbar region: Secondary | ICD-10-CM | POA: Diagnosis not present

## 2020-04-16 DIAGNOSIS — Z794 Long term (current) use of insulin: Secondary | ICD-10-CM | POA: Diagnosis not present

## 2020-04-16 DIAGNOSIS — I10 Essential (primary) hypertension: Secondary | ICD-10-CM | POA: Diagnosis not present

## 2020-04-16 NOTE — Telephone Encounter (Signed)
Spoke with the patient and I advised him that Dr. Yong Channel is aware of his current symptoms and he wants him to go the ED immediately. Patient stated that he would go to the ED to get checked out. No other questions or concerns at this time.

## 2020-04-16 NOTE — Telephone Encounter (Signed)
Patient is calling asking for an appointment for increased dizziness, the nurse from [facility] was there with patient and spoke with me about what all was going on. She states the patient has been having increased dizziness, headache for 4 days, mobility has decreased a little more than usual, and has decreased motivation was concerned for stroke. The nurse did advise patient that he should go to urgent care or ED but was told by Iona Beard that Dr.Hunter advised him to call us before going to ED or urgent care. Sent patient through to triage.

## 2020-04-16 NOTE — Telephone Encounter (Signed)
Waiting on the triage note from the nurse.

## 2020-04-16 NOTE — Telephone Encounter (Signed)
Patient advised to go to ED   Nurse Assessment Nurse: Clovis Riley, RN, Georgina Peer Date/Time Eilene Ghazi Time): 04/16/2020 1:43:32 PM Confirm and document reason for call. If symptomatic, describe symptoms. ---Caller states that he has had increased dizziness and headache for 3 days. The patient's home health nurse confirmed that she has been more lethargic and no motivation. He is also having difficulty walking around. Headache pain is dull in the back of head 5-6. States he is seeing black spots in his right eye. States he fell 2 weeks ago and hit his head on the door seal and floor. Does the patient have any new or worsening symptoms? ---Yes Will a triage be completed? ---Yes Related visit to physician within the last 2 weeks? ---No Does the PT have any chronic conditions? (i.e. diabetes, asthma, this includes High risk factors for pregnancy, etc.) ---Yes List chronic conditions. ---diabetes, neuropathy, kidney transplant 2017 Is this a behavioral health or substance abuse call? ---No Guidelines Guideline Title Affirmed Question Affirmed Notes Nurse Date/Time (Eastern Time) Dizziness - Vertigo SEVERE dizziness (vertigo) (e.g., unable to walk without assistance) Clovis Riley, RNGeorgina Peer 04/16/2020 1:47:24 PM Disp. Time Eilene Ghazi Time) Disposition Final User 04/16/2020 1:49:38 PM Go to ED Now (or PCP triage) Yes Clovis Riley, RN, Roslynn Amble NOTE: All timestamps contained within this report are represented as [place] Standard Time. CONFIDENTIALTY NOTICE: This fax transmission is intended only for the addressee. It contains information that is legally privileged, confidential or otherwise protected from use or disclosure. If you are not the intended recipient, you are strictly prohibited from reviewing, disclosing, copying using or disseminating any of this information or taking any action in reliance on or regarding this information. If you have received this fax in error, please notify us immediately by  telephone so that we can arrange for its return to [place]. Phone: [phone], Toll-Free: [phone], Fax: [phone] Page: 2 of 2 Call Id: [id number] [place] Disagree/Comply Comply Caller Understands Yes PreDisposition Call Doctor Care Advice Given Per Guideline GO TO ED NOW (OR PCP TRIAGE): * IF NO PCP (PRIMARY CARE PROVIDER) SECOND-LEVEL TRIAGE: You need to be seen within the next hour. Go to the [facility] at _____________ [place] as soon as you can. NOTE TO TRIAGER - DRIVING: * Another adult should drive. * If immediate transportation is not available via car or taxi, then the patient should be instructed to call [id number]. CARE ADVICE given per Dizziness - Vertigo (Adult) guideline. Referrals GO TO FACILITY REFUSED

## 2020-04-17 NOTE — Telephone Encounter (Signed)
Has this been completed?  Sending to clinical staff for review: Okay to sign/close encounter or is further follow up needed? 

## 2020-04-23 DIAGNOSIS — E1142 Type 2 diabetes mellitus with diabetic polyneuropathy: Secondary | ICD-10-CM | POA: Diagnosis not present

## 2020-04-23 DIAGNOSIS — Z9181 History of falling: Secondary | ICD-10-CM | POA: Diagnosis not present

## 2020-04-23 DIAGNOSIS — E1143 Type 2 diabetes mellitus with diabetic autonomic (poly)neuropathy: Secondary | ICD-10-CM | POA: Diagnosis not present

## 2020-04-23 DIAGNOSIS — K3184 Gastroparesis: Secondary | ICD-10-CM | POA: Diagnosis not present

## 2020-04-23 DIAGNOSIS — I10 Essential (primary) hypertension: Secondary | ICD-10-CM | POA: Diagnosis not present

## 2020-04-23 DIAGNOSIS — Z79899 Other long term (current) drug therapy: Secondary | ICD-10-CM | POA: Diagnosis not present

## 2020-04-23 DIAGNOSIS — Z794 Long term (current) use of insulin: Secondary | ICD-10-CM | POA: Diagnosis not present

## 2020-04-23 DIAGNOSIS — Z940 Kidney transplant status: Secondary | ICD-10-CM | POA: Diagnosis not present

## 2020-04-23 DIAGNOSIS — G3184 Mild cognitive impairment, so stated: Secondary | ICD-10-CM | POA: Diagnosis not present

## 2020-04-23 DIAGNOSIS — M5116 Intervertebral disc disorders with radiculopathy, lumbar region: Secondary | ICD-10-CM | POA: Diagnosis not present

## 2020-04-23 DIAGNOSIS — Z8673 Personal history of transient ischemic attack (TIA), and cerebral infarction without residual deficits: Secondary | ICD-10-CM | POA: Diagnosis not present

## 2020-04-23 DIAGNOSIS — Z7902 Long term (current) use of antithrombotics/antiplatelets: Secondary | ICD-10-CM | POA: Diagnosis not present

## 2020-04-23 DIAGNOSIS — E1151 Type 2 diabetes mellitus with diabetic peripheral angiopathy without gangrene: Secondary | ICD-10-CM | POA: Diagnosis not present

## 2020-04-23 DIAGNOSIS — M21372 Foot drop, left foot: Secondary | ICD-10-CM | POA: Diagnosis not present

## 2020-04-24 ENCOUNTER — Ambulatory Visit
Admission: RE | Admit: 2020-04-24 | Discharge: 2020-04-24 | Disposition: A | Discharge status: Home / Self Care | Payer: Medicare Other | Source: Ambulatory Visit | Attending: Family Medicine | Admitting: Family Medicine

## 2020-04-24 ENCOUNTER — Telehealth: Payer: Self-pay | Admitting: Physician Assistant

## 2020-04-24 ENCOUNTER — Other Ambulatory Visit: Payer: Self-pay | Admitting: Family Medicine

## 2020-04-24 DIAGNOSIS — M5416 Radiculopathy, lumbar region: Secondary | ICD-10-CM

## 2020-04-24 DIAGNOSIS — M21372 Foot drop, left foot: Secondary | ICD-10-CM

## 2020-04-24 DIAGNOSIS — M47817 Spondylosis without myelopathy or radiculopathy, lumbosacral region: Secondary | ICD-10-CM | POA: Diagnosis not present

## 2020-04-24 DIAGNOSIS — M25511 Pain in right shoulder: Secondary | ICD-10-CM

## 2020-04-24 MED ORDER — METHYLPREDNISOLONE ACETATE 40 MG/ML INJ SUSP (RADIOLOG
120.0000 mg | Freq: Once | INTRAMUSCULAR | Status: AC
  Administered 2020-04-24: 120 mg via EPIDURAL

## 2020-04-24 MED ORDER — IOPAMIDOL (ISOVUE-M 200) INJECTION 41%
1.0000 mL | Freq: Once | INTRAMUSCULAR | Status: AC
  Administered 2020-04-24: 1 mL via EPIDURAL

## 2020-04-24 NOTE — Telephone Encounter (Signed)
Called to discuss scheduling CT chest ordered by Almyra Deforest, PA---voice mail box is full---will continue to tray and reach patient

## 2020-04-24 NOTE — Discharge Instructions (Signed)

## 2020-04-25 DIAGNOSIS — Z7902 Long term (current) use of antithrombotics/antiplatelets: Secondary | ICD-10-CM | POA: Diagnosis not present

## 2020-04-25 DIAGNOSIS — G3184 Mild cognitive impairment, so stated: Secondary | ICD-10-CM | POA: Diagnosis not present

## 2020-04-25 DIAGNOSIS — Z940 Kidney transplant status: Secondary | ICD-10-CM | POA: Diagnosis not present

## 2020-04-25 DIAGNOSIS — E1151 Type 2 diabetes mellitus with diabetic peripheral angiopathy without gangrene: Secondary | ICD-10-CM | POA: Diagnosis not present

## 2020-04-25 DIAGNOSIS — Z9181 History of falling: Secondary | ICD-10-CM | POA: Diagnosis not present

## 2020-04-25 DIAGNOSIS — E1142 Type 2 diabetes mellitus with diabetic polyneuropathy: Secondary | ICD-10-CM | POA: Diagnosis not present

## 2020-04-25 DIAGNOSIS — M21372 Foot drop, left foot: Secondary | ICD-10-CM | POA: Diagnosis not present

## 2020-04-25 DIAGNOSIS — Z794 Long term (current) use of insulin: Secondary | ICD-10-CM | POA: Diagnosis not present

## 2020-04-25 DIAGNOSIS — K3184 Gastroparesis: Secondary | ICD-10-CM | POA: Diagnosis not present

## 2020-04-25 DIAGNOSIS — I10 Essential (primary) hypertension: Secondary | ICD-10-CM | POA: Diagnosis not present

## 2020-04-25 DIAGNOSIS — Z8673 Personal history of transient ischemic attack (TIA), and cerebral infarction without residual deficits: Secondary | ICD-10-CM | POA: Diagnosis not present

## 2020-04-25 DIAGNOSIS — E1143 Type 2 diabetes mellitus with diabetic autonomic (poly)neuropathy: Secondary | ICD-10-CM | POA: Diagnosis not present

## 2020-04-25 DIAGNOSIS — Z79899 Other long term (current) drug therapy: Secondary | ICD-10-CM | POA: Diagnosis not present

## 2020-04-25 DIAGNOSIS — M5116 Intervertebral disc disorders with radiculopathy, lumbar region: Secondary | ICD-10-CM | POA: Diagnosis not present

## 2020-04-26 ENCOUNTER — Ambulatory Visit: Payer: Medicare Other | Admitting: Podiatry

## 2020-04-28 DIAGNOSIS — E118 Type 2 diabetes mellitus with unspecified complications: Secondary | ICD-10-CM | POA: Diagnosis not present

## 2020-04-29 ENCOUNTER — Other Ambulatory Visit: Payer: Medicare Other

## 2020-04-29 NOTE — Telephone Encounter (Signed)
Patient is aware of appointment for CT chest no contrast scheduled Thursday 05/16/20 at 11:00 am at [facility] [address]----arrival time is 10:30 am for check in.

## 2020-04-30 DIAGNOSIS — Z940 Kidney transplant status: Secondary | ICD-10-CM | POA: Diagnosis not present

## 2020-04-30 DIAGNOSIS — E1143 Type 2 diabetes mellitus with diabetic autonomic (poly)neuropathy: Secondary | ICD-10-CM | POA: Diagnosis not present

## 2020-04-30 DIAGNOSIS — G3184 Mild cognitive impairment, so stated: Secondary | ICD-10-CM | POA: Diagnosis not present

## 2020-04-30 DIAGNOSIS — M21372 Foot drop, left foot: Secondary | ICD-10-CM | POA: Diagnosis not present

## 2020-04-30 DIAGNOSIS — K3184 Gastroparesis: Secondary | ICD-10-CM | POA: Diagnosis not present

## 2020-04-30 DIAGNOSIS — E1151 Type 2 diabetes mellitus with diabetic peripheral angiopathy without gangrene: Secondary | ICD-10-CM | POA: Diagnosis not present

## 2020-04-30 DIAGNOSIS — Z794 Long term (current) use of insulin: Secondary | ICD-10-CM | POA: Diagnosis not present

## 2020-04-30 DIAGNOSIS — I10 Essential (primary) hypertension: Secondary | ICD-10-CM | POA: Diagnosis not present

## 2020-04-30 DIAGNOSIS — Z79899 Other long term (current) drug therapy: Secondary | ICD-10-CM | POA: Diagnosis not present

## 2020-04-30 DIAGNOSIS — Z9181 History of falling: Secondary | ICD-10-CM | POA: Diagnosis not present

## 2020-04-30 DIAGNOSIS — Z8673 Personal history of transient ischemic attack (TIA), and cerebral infarction without residual deficits: Secondary | ICD-10-CM | POA: Diagnosis not present

## 2020-04-30 DIAGNOSIS — M5116 Intervertebral disc disorders with radiculopathy, lumbar region: Secondary | ICD-10-CM | POA: Diagnosis not present

## 2020-04-30 DIAGNOSIS — E1142 Type 2 diabetes mellitus with diabetic polyneuropathy: Secondary | ICD-10-CM | POA: Diagnosis not present

## 2020-04-30 DIAGNOSIS — Z7902 Long term (current) use of antithrombotics/antiplatelets: Secondary | ICD-10-CM | POA: Diagnosis not present

## 2020-05-06 DIAGNOSIS — N2581 Secondary hyperparathyroidism of renal origin: Secondary | ICD-10-CM | POA: Diagnosis not present

## 2020-05-06 DIAGNOSIS — R7309 Other abnormal glucose: Secondary | ICD-10-CM | POA: Diagnosis not present

## 2020-05-06 DIAGNOSIS — E785 Hyperlipidemia, unspecified: Secondary | ICD-10-CM | POA: Diagnosis not present

## 2020-05-06 DIAGNOSIS — I129 Hypertensive chronic kidney disease with stage 1 through stage 4 chronic kidney disease, or unspecified chronic kidney disease: Secondary | ICD-10-CM | POA: Diagnosis not present

## 2020-05-06 DIAGNOSIS — Z940 Kidney transplant status: Secondary | ICD-10-CM | POA: Diagnosis not present

## 2020-05-06 DIAGNOSIS — E1129 Type 2 diabetes mellitus with other diabetic kidney complication: Secondary | ICD-10-CM | POA: Diagnosis not present

## 2020-05-06 LAB — CBC AND DIFFERENTIAL
HCT: 45 (ref 41–53)
Hemoglobin: 14.3 (ref 13.5–17.5)
Neutrophils Absolute: 4
Platelets: 191 (ref 150–399)
WBC: 4.7

## 2020-05-06 LAB — BASIC METABOLIC PANEL
BUN: 43 — AB (ref 4–21)
CO2: 32 — AB (ref 13–22)
Chloride: 98 — AB (ref 99–108)
Creatinine: 1.7 — AB (ref 0.6–1.3)
Glucose: 168
Potassium: 4.4 (ref 3.4–5.3)
Sodium: 141 (ref 137–147)

## 2020-05-06 LAB — COMPREHENSIVE METABOLIC PANEL
Albumin: 3.8 (ref 3.5–5.0)
Calcium: 9.8 (ref 8.7–10.7)
GFR calc Af Amer: 46
GFR calc non Af Amer: 40
Globulin: 2.7

## 2020-05-06 LAB — HEPATIC FUNCTION PANEL
ALT: 12 (ref 10–40)
AST: 12 — AB (ref 14–40)
Alkaline Phosphatase: 97 (ref 25–125)
Bilirubin, Total: 0.7

## 2020-05-06 LAB — CBC: RBC: 5.22 — AB (ref 3.87–5.11)

## 2020-05-07 ENCOUNTER — Ambulatory Visit (INDEPENDENT_AMBULATORY_CARE_PROVIDER_SITE_OTHER): Payer: Medicare Other | Admitting: Podiatry

## 2020-05-07 ENCOUNTER — Encounter: Payer: Self-pay | Admitting: Podiatry

## 2020-05-07 ENCOUNTER — Other Ambulatory Visit: Payer: Self-pay

## 2020-05-07 DIAGNOSIS — D689 Coagulation defect, unspecified: Secondary | ICD-10-CM | POA: Diagnosis not present

## 2020-05-07 DIAGNOSIS — E1159 Type 2 diabetes mellitus with other circulatory complications: Secondary | ICD-10-CM

## 2020-05-07 DIAGNOSIS — B351 Tinea unguium: Secondary | ICD-10-CM

## 2020-05-07 DIAGNOSIS — M79676 Pain in unspecified toe(s): Secondary | ICD-10-CM | POA: Diagnosis not present

## 2020-05-07 NOTE — Progress Notes (Signed)
  Radiation Oncology         [phone]) [phone] ________________________________  Name: [name] MRN: [id number]  Date: 10/24/2019  DOB: May 09, 1951  End of Treatment Note  Diagnosis:   69 y.o. gentleman with Stage T1c adenocarcinoma of the prostate with Gleason score of 4+3, and PSA of 25.6.     Indication for treatment:  Curative, Definitive Radiotherapy       Radiation treatment dates:   08/29/19-10/24/19  Site/dose:  1. The prostate, seminal vesicles, and pelvic lymph nodes were initially treated to 45 Gy in 25 fractions of 1.8 Gy  2. The prostate only was boosted to 75 Gy with 15 additional fractions of 2.0 Gy   Beams/energy:  1. The prostate, seminal vesicles, and pelvic lymph nodes were initially treated using VMAT intensity modulated radiotherapy delivering 6 megavolt photons. Image guidance was performed with CB-CT studies prior to each fraction. He was immobilized with a body fix lower extremity mold.  2. the prostate only was boosted using VMAT intensity modulated radiotherapy delivering 6 megavolt photons. Image guidance was performed with CB-CT studies prior to each fraction. He was immobilized with a body fix lower extremity mold.  Narrative: The patient tolerated radiation treatment relatively well.   The patient experienced some minor urinary irritation and modest fatigue.    Plan: The patient has completed radiation treatment. He will return to radiation oncology clinic for routine followup in one month. I advised him to call or return sooner if he has any questions or concerns related to his recovery or treatment. ________________________________  Sheral Apley. Tammi Klippel, M.D.

## 2020-05-07 NOTE — Progress Notes (Signed)
This patient returns to my office for at risk foot care.  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  Patient is taking plavix.  This patient is unable to cut nails himself since the patient cannot reach his nails.These nails are painful walking and wearing shoes.  This patient presents for at risk foot care today.   General Appearance  Alert, conversant and in no acute stress.  Vascular  Dorsalis pedis and posterior tibial  pulses are palpable  bilaterally.  Capillary return is within normal limits  bilaterally. Temperature is within normal limits  bilaterally.  Neurologic  Senn-Weinstein monofilament wire test within normal limits  bilaterally.   Nails Thick disfigured discolored nails with subungual debris  from hallux to fifth toes bilaterally. No evidence of bacterial infection or drainage bilaterally.  Orthopedic  No limitations of motion  feet .  No crepitus or effusions noted.  No bony pathology or digital deformities noted. No motion 2nd toe left foot at PIPJ.    Skin  normotropic skin with no porokeratosis noted bilaterally.  No signs of infections or ulcers noted.     Onychomycosis  Pain in right toes  Pain in left toes  Consent was obtained for treatment procedures.   Mechanical debridement of nails 1-5  bilaterally performed with a nail nipper.  Filed with dremel without incident.   Return office visit  3 months                    Told patient to return for periodic foot care and evaluation due to potential at risk complications.   Gardiner Barefoot DPM

## 2020-05-08 NOTE — Progress Notes (Signed)
No show

## 2020-05-10 ENCOUNTER — Ambulatory Visit: Payer: Medicare Other

## 2020-05-13 ENCOUNTER — Ambulatory Visit (INDEPENDENT_AMBULATORY_CARE_PROVIDER_SITE_OTHER): Payer: Medicare Other | Admitting: Family Medicine

## 2020-05-13 DIAGNOSIS — Z5329 Procedure and treatment not carried out because of patient's decision for other reasons: Secondary | ICD-10-CM

## 2020-05-16 ENCOUNTER — Ambulatory Visit
Admission: RE | Admit: 2020-05-16 | Discharge: 2020-05-16 | Disposition: A | Discharge status: Home / Self Care | Payer: Medicare Other | Source: Ambulatory Visit | Attending: Physician Assistant | Admitting: Physician Assistant

## 2020-05-16 DIAGNOSIS — R918 Other nonspecific abnormal finding of lung field: Secondary | ICD-10-CM | POA: Diagnosis not present

## 2020-05-16 DIAGNOSIS — I712 Thoracic aortic aneurysm, without rupture, unspecified: Secondary | ICD-10-CM

## 2020-05-21 ENCOUNTER — Telehealth: Payer: Self-pay

## 2020-05-21 NOTE — Telephone Encounter (Addendum)
Left voice message for the patient to give the office a call back for his results.  ----- Message from [place], [place] sent at 05/18/2020  4:23 PM EST ----- 4.8 cm aortic aneurysm, size is unchanged when compare to the previous CT in June. Also noted some pulmonary nodule. According to the current protocol, recommend repeat study (CT chest wo contrast) in 6 month. Overdue for follow up, prefer to set up with Dr. Jens Som

## 2020-05-29 DIAGNOSIS — E118 Type 2 diabetes mellitus with unspecified complications: Secondary | ICD-10-CM | POA: Diagnosis not present

## 2020-06-05 NOTE — Progress Notes (Addendum)
I, Peterson Lombard, LAT, ATC acting as a scribe for Lynne Leader, MD.  [name] is a 70 y.o. male who presents to [place] [facility] today for R side pain following a fall a week ago and landed on R-side hip and back. Pt c/o bilat leg weakness and says he can't control his legs. Pt was last seen by Dr. Georgina Snell on 04/10/20 for LBP and L foot drop.   Patient had an epidural steroid injection for his lumbar radiculopathy December 1.  He notes his pain was doing pretty well until he fell last week.  Since then significant leg weakness is present.  Additionally he notes new fecal incontinence.  He denies any numbness or saddle anesthesia.    Dx imaging: 04/10/20 R shoulder XR  12/19/19 L-spine MRI  Pertinent review of systems: No fevers or chills  Relevant historical information: PAD.  Diabetes.  A sending aortic aneurysm.   Exam:  BP (!) 142/88 (BP Location: Right Arm, Patient Position: Sitting, Cuff Size: Normal)   Pulse (!) 103   Ht 5\' 10"  (1.778 m)   Wt 262 lb (118.8 kg)   SpO2 98%   BMI 37.59 kg/m  General: Well Developed, well nourished, and in no acute distress.   MSK: L-spine nontender.  Decreased lumbar motion.  Lower extremity strength is diminished as noted below.  Hp flx 3/5 bl Hip abd 4/5 bl Hip add 5/5 bl Knee ext rt 5/5 lt 4/5 Knee flex 5/5 bl Foot dorsiflexion rt 4/5 lt 2/5 Foot plantarflexion 5/5 bl  Sensation is intact throughout bilateral lower extremities. Reflexes diminished bilateral knees and ankles.  Patient is ambulating with the use of walker slowly.  Lab and Radiology Results  X-ray images L-spine obtained today personally and independently interpreted X-ray L-spine shows degenerative changes.  No acute fractures visible. Await formal radiology review  EXAM: MRI LUMBAR SPINE WITHOUT CONTRAST  TECHNIQUE: Multiplanar, multisequence MR imaging of the lumbar spine was performed. No intravenous contrast was  administered.  COMPARISON:  Prior MRI from 10/07/2018.  FINDINGS: Segmentation: Standard. Lowest well-formed disc space labeled the L5-S1 level.  Alignment: 4 mm retrolisthesis of L5 on S1, stable. Associated trace anterolisthesis of L4 on L5, also unchanged. Alignment otherwise normal with preservation of the normal lumbar lordosis.  Vertebrae: Vertebral body height maintained without evidence for acute or chronic fracture. Prominent chronic endplate Schmorl's node deformity noted at the superior endplate of L3, stable. Post radiation changes seen within the visualized bone marrow extending from the L5 level inferiorly into the sacrum. Elsewhere, visualized bone marrow signal intensity is mildly heterogeneous but overall within normal limits. Few scattered benign hemangiomata noted. No worrisome osseous lesions. No abnormal marrow edema.  Conus medullaris and cauda equina: Conus extends to the L1 level. Conus and cauda equina appear normal.  Paraspinal and other soft tissues: Paraspinous soft tissues demonstrate no acute finding. Bilateral renal atrophy with multiple scattered cysts noted bilaterally, largest of which measures 2 cm at the interpolar right kidney. A few of these are mildly complex in appearance, likely reflecting proteinaceous and/or hemorrhagic cyst as seen on prior abdominal MRI. Prominent distension of the partially visualized urinary bladder.  Disc levels:  L1-2: Negative interspace. Mild facet hypertrophy. No canal or foraminal stenosis.  L2-3: Mild diffuse disc bulge with disc desiccation. Disc bulging asymmetric to the left. Prominent endplate Schmorl's node deformity noted at the superior endplate of L3. Mild to moderate facet and ligament flavum hypertrophy. No significant spinal  stenosis. Mild left L2 foraminal narrowing, unchanged. No significant right foraminal encroachment.  L3-4: Disc desiccation with minimal disc bulge, slightly  asymmetric to the left. Mild to moderate facet and ligament flavum hypertrophy. No significant spinal stenosis. Mild left L3 foraminal narrowing, stable. No significant right foraminal encroachment.  L4-5: Mild diffuse disc bulge with disc desiccation. Superimposed small left foraminal disc protrusion is seen, slightly more prominent and focal in nature as compared to previous (series 8, image 27). This closely approximates the exiting left L4 nerve root. Moderate facet and ligament flavum hypertrophy with associated trace joint effusions. Resultant mild spinal stenosis, relatively unchanged. Mild left greater than right L4 foraminal narrowing, similar.  L5-S1: Trace retrolisthesis. Diffuse disc bulge with disc desiccation and intervertebral disc space narrowing. Mild reactive endplate spurring. Moderate bilateral facet hypertrophy. Resultant mild to moderate left greater than right lateral recess stenosis, descending S1 nerve root level. Moderate bilateral L5 foraminal narrowing, unchanged.  IMPRESSION: 1. No acute abnormality within the lumbar spine. 2. Small left foraminal disc protrusion at L4-5, closely approximating and potentially irritating the exiting left L4 nerve root. This is slightly increased in size/prominence as compared to previous. 3. Multifactorial degenerative changes at L5-S1, resulting in mild to moderate left greater than right lateral recess stenosis, with moderate bilateral L5 foraminal narrowing. Overall appearance is stable from previous. 4. Mild left eccentric disc bulge and facet hypertrophy at L2-3 and L3-4 with resultant mild left L2 and L3 foraminal stenosis, similar to prior.   Electronically Signed   By: Jeannine Boga M.D.   On: 12/19/2019 04:01  I, Lynne Leader, personally (independently) visualized and performed the interpretation of the images attached in this note.   Assessment and Plan: 70 y.o. male with new onset bilateral  lower extremity weakness after a fall associated with fecal incontinence.  This is highly concerning for cauda equina syndrome or other severe neural impingement disorders.  He has new bilateral hip flexor weakness and worsening left foot dorsiflexion weakness. Plan for x-ray today and stat MRI.  He may require admission to the hospital or urgent referral to neurosurgery based on MRI results.  Normal in his situation like this I will be prescribing prednisone.  Patient has significant diabetes.  He declined course of prednisone for now electing to wait until MRI results are back.  Also concerning for today is that over the weekend we are expecting heavy snow fall which may impair his ability to receive medical care.  Recheck following MRI.  ------------------------------------ Addendum: 1/141/22  X-ray read concerning for L2 transverse fracture on the right.  Patient has an MRI scheduled for Saturday, January 15 at 10 AM.  This is the appropriate next step.  Patient has already expressed his desire to avoid the emergency room.  The original injury occurred a week ago and his symptoms are stable.  I think 1 more day of waiting is unlikely to cause significant harm.  Plan for MRI on the 15th as already scheduled. Of note on-call provider should contact my cell phone [phone] (text first if able) to help coordinate care.  Anticipate that I will be speaking with the on-call neurosurgery based on MRI results.   PDMP not reviewed this encounter. Orders Placed This Encounter  Procedures  . MR Lumbar Spine Wo Contrast    Standing Status:   Future    Standing Expiration Date:   06/06/2021    Order Specific Question:   What is the patient's sedation requirement?    Answer:  No Sedation    Order Specific Question:   Does the patient have a pacemaker or implanted devices?    Answer:   No    Order Specific Question:   Preferred imaging location?    Answer:   [facility] (table limit - 550  lbs)  . DG Lumbar Spine 2-3 Views    Standing Status:   Future    Number of Occurrences:   1    Standing Expiration Date:   06/06/2021    Order Specific Question:   Reason for Exam (SYMPTOM  OR DIAGNOSIS REQUIRED)    Answer:   eval now leg weakness after fall    Order Specific Question:   Preferred imaging location?    Answer:   [facility]   No orders of the defined types were placed in this encounter.    Discussed warning signs or symptoms. Please see discharge instructions. Patient expresses understanding.   The above documentation has been reviewed and is accurate and complete Lynne Leader, M.D.

## 2020-06-06 ENCOUNTER — Other Ambulatory Visit: Payer: Self-pay

## 2020-06-06 ENCOUNTER — Ambulatory Visit (INDEPENDENT_AMBULATORY_CARE_PROVIDER_SITE_OTHER): Payer: Medicare Other | Admitting: Family Medicine

## 2020-06-06 ENCOUNTER — Ambulatory Visit (INDEPENDENT_AMBULATORY_CARE_PROVIDER_SITE_OTHER): Payer: Medicare Other

## 2020-06-06 VITALS — BP 142/88 | HR 103 | Ht 70.0 in | Wt 262.0 lb

## 2020-06-06 DIAGNOSIS — G834 Cauda equina syndrome: Secondary | ICD-10-CM

## 2020-06-06 DIAGNOSIS — M5442 Lumbago with sciatica, left side: Secondary | ICD-10-CM

## 2020-06-06 DIAGNOSIS — M5441 Lumbago with sciatica, right side: Secondary | ICD-10-CM

## 2020-06-06 DIAGNOSIS — M545 Low back pain, unspecified: Secondary | ICD-10-CM | POA: Diagnosis not present

## 2020-06-06 NOTE — Patient Instructions (Signed)
Thank you for coming in today.  Plan for xray today ad STAT MRI soon.   Recheck following MRI likely next week unless there is an emergency.

## 2020-06-07 NOTE — Progress Notes (Signed)
X-ray of lumbar spine shows concern for fracture of the L2 transverse process.  This would necessitate a MRI which is already scheduled for tomorrow.  I think it is okay to wait till the MRI tomorrow unless you are clinically worsening.

## 2020-06-08 ENCOUNTER — Ambulatory Visit (HOSPITAL_COMMUNITY): Admission: RE | Admit: 2020-06-08 | Payer: Medicare Other | Source: Ambulatory Visit

## 2020-06-10 ENCOUNTER — Inpatient Hospital Stay (HOSPITAL_COMMUNITY): Admission: RE | Admit: 2020-06-10 | Payer: Medicare Other | Source: Ambulatory Visit

## 2020-06-10 ENCOUNTER — Encounter (HOSPITAL_COMMUNITY): Payer: Self-pay

## 2020-06-12 ENCOUNTER — Other Ambulatory Visit: Payer: Self-pay

## 2020-06-12 ENCOUNTER — Ambulatory Visit (HOSPITAL_COMMUNITY)
Admission: RE | Admit: 2020-06-12 | Discharge: 2020-06-12 | Disposition: A | Discharge status: Home / Self Care | Payer: Medicare Other | Source: Ambulatory Visit | Attending: Family Medicine | Admitting: Family Medicine

## 2020-06-12 ENCOUNTER — Telehealth: Payer: Self-pay | Admitting: Family Medicine

## 2020-06-12 DIAGNOSIS — M5442 Lumbago with sciatica, left side: Secondary | ICD-10-CM

## 2020-06-12 DIAGNOSIS — G834 Cauda equina syndrome: Secondary | ICD-10-CM | POA: Insufficient documentation

## 2020-06-12 DIAGNOSIS — M545 Low back pain, unspecified: Secondary | ICD-10-CM | POA: Diagnosis not present

## 2020-06-12 DIAGNOSIS — M5416 Radiculopathy, lumbar region: Secondary | ICD-10-CM

## 2020-06-12 DIAGNOSIS — M5441 Lumbago with sciatica, right side: Secondary | ICD-10-CM

## 2020-06-12 NOTE — Telephone Encounter (Signed)
Called pt and informed him of Dr. Clovis Riley advice.  Pt verbalizes understanding.

## 2020-06-12 NOTE — Telephone Encounter (Signed)
Epidural steroid injection ordered.  Patient should stop Plavix now.  Contact [facility] to schedule.  [phone].

## 2020-06-12 NOTE — Progress Notes (Signed)
MRI lumbar spine does not show a fracture.  It does show however an area where the nerve is getting pinched on the right side L5 and S1.  We are going to try treating this with an injection in the back.  Please stop the Plavix now as that will need to be stopped for at least a week prior to the injection.  Please contact [facility] to schedule the back injection.  Phone number is [phone].

## 2020-06-14 ENCOUNTER — Telehealth: Payer: Self-pay

## 2020-06-14 NOTE — Telephone Encounter (Signed)
   [facility] Pre-operative Risk Assessment    Request for surgical clearance:  1. What type of surgery is being performed? LUMBAR ESI   2. When is this surgery scheduled? TBD   3. What type of clearance is required (medical clearance vs. Pharmacy clearance to hold med vs. Both)? PHARMACY  4. Are there any medications that need to be held prior to surgery and how long? PLAVIX-5 DAYS   5. Practice name and name of physician performing surgery? [facility]    6. What is the office phone number? [phone]   7.   What is the office fax number? [phone]  8.   Anesthesia type (None, local, MAC, general) ? LOCAL

## 2020-06-14 NOTE — Telephone Encounter (Signed)
Pt has been scheduled to see Dr. Stanford Breed on 06/18/2020 for surgical clearance.  Will fax back to the requesting surgeon's office to make them aware.

## 2020-06-14 NOTE — Telephone Encounter (Signed)
Primary Cardiologist:Brian Stanford Breed, MD  Chart reviewed as part of pre-operative protocol coverage. Because of [name]'s past medical history and time since last visit, he/she will require a follow-up visit in order to better assess preoperative cardiovascular risk.  Pre-op covering staff: - Please schedule appointment and call patient to inform them. - Please contact requesting surgeon's office via preferred method (i.e, phone, fax) to inform them of need for appointment prior to surgery.  If applicable, this message will also be routed to pharmacy pool and/or primary cardiologist for input on holding anticoagulant/antiplatelet agent as requested below so that this information is available at time of patient's appointment.   Deberah Pelton, NP  06/14/2020, 2:05 PM

## 2020-06-17 NOTE — Progress Notes (Deleted)
HPI: Follow-up coronary artery disease.  Patient has had previous PCI of OM in 2014.  Nuclear study July 2019 showed inferolateral ischemia and ejection fraction 54%.  Patient treated medically.  Lower extremity Dopplers July 2019 showed 2 areas of 50 to 74% stenosis in the proximal and proximal to mid SFA on the left and left distal ATA severely diminished.  Echocardiogram July 2021 showed vigorous LV function, severe left ventricular hypertrophy, grade 1 diastolic dysfunction, trace aortic insufficiency and dilated ascending aorta at 48 mm.  Monitor December 2021 showed sinus with first-degree AV block and rare PVC.  Chest CT December 2021 showed 4.8 cm ascending thoracic aortic aneurysm, pulmonary nodules and follow-up recommended 3 to 6 months.  Current Outpatient Medications  Medication Sig Dispense Refill  . acetaminophen (TYLENOL) 500 MG tablet Take 1,000 mg by mouth as needed for mild pain or headache.     . allopurinol (ZYLOPRIM) 100 MG tablet Take 200 mg by mouth daily.    . AMBULATORY NON FORMULARY MEDICATION Ankle Foot Orthosis  Foot Drop M21.372 Disp 1 1 each 0  . AMBULATORY NON FORMULARY MEDICATION Rolling Walker.  Foot drop M21.372 1 each 0  . aspirin 81 MG tablet Take 81 mg by mouth at bedtime.    [name] atorvastatin (LIPITOR) 40 MG tablet Take 40 mg by mouth daily.    . clopidogrel (PLAVIX) 75 MG tablet Take 75 mg by mouth daily.    . diclofenac Sodium (VOLTAREN) 1 % GEL Apply 2 g topically 4 (four) times daily. 50 g 0  . furosemide (LASIX) 40 MG tablet Take 1 tablet (40 mg total) by mouth daily as needed for fluid. 30 tablet   . gabapentin (NEURONTIN) 300 MG capsule Take 2 capsules in morning, 1 capsule in afternoon, and 2 capsules at night. (Patient taking differently: Take 300 mg by mouth See admin instructions. Take 2 capsules (600mg ) by mouth in the morning, take 1 capsule (300mg ) by mouth midday, and take 2 capsules (600mg ) by mouth at night.) 150 capsule 5  . glucose  blood (ACCU-CHEK AVIVA PLUS) test strip USE TO TEST BLOOD SUGAR 3 TIMES A DAY 300 strip 1  . insulin aspart protamine- aspart (NOVOLOG MIX 70/30) (70-30) 100 UNIT/ML injection Inject 75 Units into the skin 2 (two) times daily with a meal.     . Insulin Syringe-Needle U-100 (B-D INS SYR ULTRAFINE 1CC/31G) 31G X 5/16" 1 ML MISC Used to give insulin injections twice daily. 100 each 11  . lidocaine (LIDODERM) 5 % Place 1 patch onto the skin daily. Remove & Discard patch within 12 hours or as directed by MD 30 patch 0  . Magnesium 200 MG TABS Take 200 mg by mouth every evening.     . mycophenolate (MYFORTIC) 180 MG EC tablet Take 180-360 mg by mouth See admin instructions. Take 2 tablets (360mg ) by mouth in the morning and 1 tablet (180mg ) by mouth at night.    . nitroGLYCERIN (NITROSTAT) 0.4 MG SL tablet PLACE 1 TABLET (0.4 MG TOTAL) UNDER THE TONGUE EVERY 5 (FIVE) MINUTES AS NEEDED FOR CHEST PAIN. 25 tablet 2  . nortriptyline (PAMELOR) 50 MG capsule TAKE 2 CAPSULES (100 MG TOTAL) BY MOUTH AT BEDTIME. 180 capsule 2  . omeprazole (PRILOSEC) 20 MG capsule Take 1 capsule (20 mg total) by mouth 2 (two) times daily before a meal. 180 capsule 3  . Semaglutide,0.25 or 0.5MG /DOS, (OZEMPIC, 0.25 OR 0.5 MG/DOSE,) 2 MG/1.5ML SOPN Inject 1 mg into the  skin once a week.     . tacrolimus (PROGRAF) 5 MG capsule Take 5 mg by mouth 2 (two) times daily.     . Vitamin D, Ergocalciferol, (DRISDOL) 1.25 MG (50000 UNIT) CAPS capsule Take 50,000 Units by mouth 2 (two) times a week.     No current facility-administered medications for this visit.     Past Medical History:  Diagnosis Date  . Allergy   . Angina   . Arthritis   . Backache, unspecified   . CHF (congestive heart failure) ([place])   . Chills   . Chronic kidney disease, stage III (moderate) (HCC)    HD T- TH-SAT  . Complication of anesthesia    01/2011 could not eat,, hospt x2, was placed on hd and cleared up  . Coronary atherosclerosis of native coronary  artery   . Diabetes mellitus 2004  . Dizziness   . Dysphagia, unspecified(787.20)   . Fall at home 11/2019  . Gastroparesis   . Headache(784.0)   . Hemorrhage of rectum and anus   . Hyperlipidemia   . Hypertrophy of prostate with urinary obstruction and other lower urinary tract symptoms (LUTS)   . INTERNAL HEMORRHOIDS 10/16/2008   Qualifier: Diagnosis of  By: Nolon Rod CMA (AAMA), Robin    . Internal hemorrhoids without mention of complication   . Myocardial infarction ([place]) 2002  . Nausea alone   . Obesity   . Other dyspnea and respiratory abnormality   . Other malaise and fatigue   . Personal history of unspecified circulatory disease   . Posttraumatic stress disorder   . Prostate cancer ([place])   . Sleep apnea    USES CPAP   . Stroke [facility])    2007  . Unspecified essential hypertension    hx htn     Past Surgical History:  Procedure Laterality Date  . ANAL FISSURECTOMY    . AV FISTULA PLACEMENT    . CARDIAC CATHETERIZATION     2005 DR BRODIE  . HEMORRHOID SURGERY    . 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 PROSTATE BIOPSY    . revision of fistula     renal failure  . VENTRAL HERNIA REPAIR  07/01/2011   Procedure: HERNIA REPAIR VENTRAL ADULT;  Surgeon: Joyice Faster. Cornett, MD;  Location: [facility] OR;  Service: General;  Laterality: N/A;    Social History   Socioeconomic History  . Marital status: Divorced    Spouse name: Manufacturing engineer  . Number of children: 3  . Years of education: Not on file  . Highest education level: Associate degree: academic program  Occupational History  . Occupation: disabled    Employer: DISABLED  Tobacco Use  . Smoking status: Never Smoker  . Smokeless tobacco: Never Used  Vaping Use  . Vaping Use: Never used  Substance and Sexual Activity  . Alcohol use: No  . Drug use: No  . Sexual activity: Not Currently  Other Topics Concern  . Not on file  Social History Narrative   Married 4854. 25 year old son in 75. 1 granddaughter from [place].        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 He lives with his wife. He drinks 3-4 cups of coffee a day. He walks most every day.   Social Determinants of Health   Financial Resource Strain: Not  on file  Food Insecurity: Not on file  Transportation Needs: Not on file  Physical Activity: Not on file  Stress: Not on file  Social Connections: Not on file  Intimate Partner Violence: Not on file    Family History  Problem Relation Age of Onset  . Heart disease Father   . Renal Disease Father   . Dementia Mother   . Heart attack Mother   . Diabetes Sister   . Heart disease Sister   . Diabetes Maternal Aunt   . Diabetes Maternal Uncle   . Diabetes Paternal Aunt   . Diabetes Paternal Uncle   . Heart disease Other   . Heart attack Brother   . Lung cancer Sister   . Renal Disease Brother   . Ovarian cancer Cousin   . Lung cancer Cousin   . Breast cancer Neg Hx   . Colon cancer Neg Hx   . Pancreatic cancer Neg Hx   . Prostate cancer Neg Hx     ROS: no fevers or chills, productive cough, hemoptysis, dysphasia, odynophagia, melena, hematochezia, dysuria, hematuria, rash, seizure activity, orthopnea, PND, pedal edema, claudication. Remaining systems are negative.  Physical Exam: Well-developed well-nourished in no acute distress.  Skin is warm and dry.  HEENT is normal.  Neck is supple.  Chest is clear to auscultation with normal expansion.  Cardiovascular exam is regular rate and rhythm.  Abdominal exam nontender or distended. No masses palpated. Extremities show no edema. neuro grossly intact  ECG- personally reviewed  A/P  1 coronary artery disease-patient denies chest pain.  Continue medical therapy with aspirin and statin.  2 thoracic aortic aneurysm-patient will need follow-up CTA June 2022.  3 pulmonary  nodules-follow-up CTA June 2022.  4 peripheral vascular disease-plan to continue medical therapy with aspirin and statin.  Patient denies claudication.  5 previous CVA  6 previous renal transplant  Kirk Ruths, MD

## 2020-06-18 ENCOUNTER — Ambulatory Visit: Payer: Medicare Other | Admitting: Cardiology

## 2020-06-19 ENCOUNTER — Other Ambulatory Visit: Payer: Self-pay | Admitting: Physician Assistant

## 2020-06-20 ENCOUNTER — Ambulatory Visit (INDEPENDENT_AMBULATORY_CARE_PROVIDER_SITE_OTHER): Payer: Medicare Other | Admitting: Cardiology

## 2020-06-20 ENCOUNTER — Telehealth: Payer: Self-pay | Admitting: Cardiology

## 2020-06-20 ENCOUNTER — Encounter: Payer: Self-pay | Admitting: Cardiology

## 2020-06-20 ENCOUNTER — Other Ambulatory Visit: Payer: Self-pay

## 2020-06-20 VITALS — BP 130/76 | HR 93 | Ht 70.5 in | Wt 238.0 lb

## 2020-06-20 DIAGNOSIS — I10 Essential (primary) hypertension: Secondary | ICD-10-CM | POA: Diagnosis not present

## 2020-06-20 DIAGNOSIS — Z01818 Encounter for other preprocedural examination: Secondary | ICD-10-CM | POA: Diagnosis not present

## 2020-06-20 DIAGNOSIS — I2510 Atherosclerotic heart disease of native coronary artery without angina pectoris: Secondary | ICD-10-CM

## 2020-06-20 DIAGNOSIS — I712 Thoracic aortic aneurysm, without rupture, unspecified: Secondary | ICD-10-CM

## 2020-06-20 DIAGNOSIS — Z940 Kidney transplant status: Secondary | ICD-10-CM

## 2020-06-20 NOTE — Assessment & Plan Note (Signed)
H/O OM PCI 2014. Abnormal Myoview Aug 2019- medical Rx secondary to CRI

## 2020-06-20 NOTE — Progress Notes (Signed)
Cardiology Office Note:    Date:  06/20/2020   ID:  [name], DOB Mar 21, 1951, MRN [id number]  PCP:  Ronald Olp, MD  Cardiologist:  Ronald Ruths, MD  Electrophysiologist:  None   Referring MD: Ronald Olp, MD   No chief complaint on file. Pre op clearance   History of Present Illness:    [name] is a 70 y.o. male with a hx of a history of coronary disease, status post remote OM PCI in 2014.  Other medical issues include end-stage renal disease, he did receive a kidney transplant in 2019 and is currently not on dialysis.  He is followed by Dr. Moshe [name].  He had a stroke in 2007 treated with thrombolytics.  He has insulin-dependent diabetes and is followed closely by his primary care provider.  He does have a known thoracic aneurysm, 4.8 cm in December 2021.  He gets a CT every 6 months.  The patient is seen in the office today for preop clearance.  The last time we saw him was November 2020 for preoperative prostate biopsy clearance.  He did have prostate cancer and this was treated with radiation.  He says he is doing well from that standpoint.  He was admitted in 2019 with chest pain.  He had an abnormal Myoview at that time but it was elected to treat him medically because of risk to his kidney function.  In July 2021 he was admitted with chest pain it was felt to be noncardiac, the patient says he spoke to Dr. Stanford [name] but I could see no notes from cardiology during that admission.  Echo during that admission showed an EF of greater than 75% with severe LVH and grade 1 diastolic dysfunction.  The patient is here today for preoperative clearance prior to a lumbar spine injection.  From a cardiac standpoint he sounds like he is doing pretty well.  He is not had rest symptoms.  He is unstable and uses a rolling walker.  He is having this back injection because he had a fall.  He has shortness of breath with exertion such as walking uphill.  Past Medical History:   Diagnosis Date  . Allergy   . Angina   . Arthritis   . Backache, unspecified   . CHF (congestive heart failure) ([place])   . Chills   . Chronic kidney disease, stage III (moderate) (HCC)    HD T- TH-SAT  . Complication of anesthesia    01/2011 could not eat,, hospt x2, was placed on hd and cleared up  . Coronary atherosclerosis of native coronary artery   . Diabetes mellitus 2004  . Dizziness   . Dysphagia, unspecified(787.20)   . Fall at home 11/2019  . Gastroparesis   . Headache(784.0)   . Hemorrhage of rectum and anus   . Hyperlipidemia   . Hypertrophy of prostate with urinary obstruction and other lower urinary tract symptoms (LUTS)   . INTERNAL HEMORRHOIDS 10/16/2008   Qualifier: Diagnosis of  By: Nolon Rod CMA (AAMA), Robin    . Internal hemorrhoids without mention of complication   . Myocardial infarction ([place]) 2002  . Nausea alone   . Obesity   . Other dyspnea and respiratory abnormality   . Other malaise and fatigue   . Personal history of unspecified circulatory disease   . Posttraumatic stress disorder   . Prostate cancer ([name])   . Sleep apnea    USES CPAP   . Stroke [facility])  2007  . Unspecified essential hypertension    hx htn     Past Surgical History:  Procedure Laterality Date  . ANAL FISSURECTOMY    . AV FISTULA PLACEMENT    . CARDIAC CATHETERIZATION     2005 DR [name]  . HEMORRHOID SURGERY    . 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 PROSTATE BIOPSY    . revision of fistula     renal failure  . VENTRAL HERNIA REPAIR  07/01/2011   Procedure: HERNIA REPAIR VENTRAL ADULT;  Surgeon: [name]. Cornett, MD;  Location: [facility] OR;  Service: General;  Laterality: N/A;    Current Medications: Current Meds  Medication Sig  . acetaminophen (TYLENOL) 500 MG tablet Take 1,000 mg by mouth as needed for mild pain or headache.   . allopurinol (ZYLOPRIM) 100 MG tablet Take 200 mg by mouth daily.  . AMBULATORY NON FORMULARY MEDICATION Ankle Foot Orthosis  Foot Drop  M21.372 Disp 1  . AMBULATORY NON FORMULARY MEDICATION Rolling Walker.  Foot drop M21.372  . aspirin 81 MG tablet Take 81 mg by mouth at bedtime.  [name] atorvastatin (LIPITOR) 40 MG tablet Take 40 mg by mouth daily.  . clopidogrel (PLAVIX) 75 MG tablet Take 75 mg by mouth daily.  . diclofenac Sodium (VOLTAREN) 1 % GEL Apply 2 g topically 4 (four) times daily.  . furosemide (LASIX) 40 MG tablet Take 1 tablet (40 mg total) by mouth daily as needed for fluid.  [name] gabapentin (NEURONTIN) 300 MG capsule Take 2 capsules in morning, 1 capsule in afternoon, and 2 capsules at night. (Patient taking differently: Take 300 mg by mouth See admin instructions. Take 2 capsules (600mg ) by mouth in the morning, take 1 capsule (300mg ) by mouth midday, and take 2 capsules (600mg ) by mouth at night.)  . insulin aspart protamine- aspart (NOVOLOG MIX 70/30) (70-30) 100 UNIT/ML injection Inject 75 Units into the skin 2 (two) times daily with a meal.   . Insulin Syringe-Needle U-100 (B-D INS SYR ULTRAFINE 1CC/31G) 31G X 5/16" 1 ML MISC Used to give insulin injections twice daily.  [name] lidocaine (LIDODERM) 5 % Place 1 patch onto the skin daily. Remove & Discard patch within 12 hours or as directed by MD  . Magnesium 200 MG TABS Take 200 mg by mouth every evening.   . mycophenolate (MYFORTIC) 180 MG EC tablet Take 180-360 mg by mouth See admin instructions. Take 2 tablets (360mg ) by mouth in the morning and 1 tablet (180mg ) by mouth at night.  . nitroGLYCERIN (NITROSTAT) 0.4 MG SL tablet PLACE 1 TABLET (0.4 MG TOTAL) UNDER THE TONGUE EVERY 5 (FIVE) MINUTES AS NEEDED FOR CHEST PAIN.  [name] nortriptyline (PAMELOR) 50 MG capsule TAKE 2 CAPSULES (100 MG TOTAL) BY MOUTH AT BEDTIME.  [name] omeprazole (PRILOSEC) 20 MG capsule Take 1 capsule (20 mg total) by mouth 2 (two) times daily before a meal.  . Semaglutide,0.25 or 0.5MG /DOS, (OZEMPIC, 0.25 OR 0.5 MG/DOSE,) 2 MG/1.5ML SOPN Inject 1 mg into the skin once a week.   . tacrolimus (PROGRAF) 5 MG  capsule Take 5 mg by mouth 2 (two) times daily.   . Vitamin D, Ergocalciferol, (DRISDOL) 1.25 MG (50000 UNIT) CAPS capsule Take 50,000 Units by mouth 2 (two) times a week.     Allergies:   Patient has no known allergies.   Social History   Socioeconomic History  . Marital status: Divorced    Spouse name: Manufacturing engineer  . Number of children: 3  . Years of education:  Not on file  . Highest education level: Associate degree: academic program  Occupational History  . Occupation: disabled    Employer: DISABLED  Tobacco Use  . Smoking status: Never Smoker  . Smokeless tobacco: Never Used  Vaping Use  . Vaping Use: Never used  Substance and Sexual Activity  . Alcohol use: No  . Drug use: No  . Sexual activity: Not Currently  Other Topics Concern  . Not on file  Social History Narrative   Married 6623. 50 year old son in 19. 1 granddaughter from [place].       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 He lives with his wife. He drinks 3-4 cups of coffee a day. He walks most every day.   Social Determinants of Health   Financial Resource Strain: Not on file  Food Insecurity: Not on file  Transportation Needs: Not on file  Physical Activity: Not on file  Stress: Not on file  Social Connections: Not on file     Family History: The patient's family history includes Dementia in his mother; Diabetes in his maternal aunt, maternal uncle, paternal aunt, paternal uncle, and sister; Heart attack in his brother and mother; Heart disease in his father, sister, and another family member; Lung cancer in his cousin and sister; Ovarian cancer in his cousin; Renal Disease in his brother and father. There is no history of Breast cancer, Colon cancer, Pancreatic cancer, or Prostate cancer.  ROS:   Please see the history of present illness.      All other systems reviewed and are negative.  EKGs/Labs/Other Studies Reviewed:    The following studies were reviewed today: Echo July 2021- IMPRESSIONS    1. There is no significant left ventricular outflow tract gradient. Left  ventricular ejection fraction, by estimation, is >75%. The left ventricle  has hyperdynamic function. The left ventricle has no regional wall motion  abnormalities. There is severe  concentric left ventricular hypertrophy. Left ventricular diastolic  parameters are consistent with Grade I diastolic dysfunction (impaired  relaxation).  2. Right ventricular systolic function is normal. The right ventricular  size is normal. There is normal pulmonary artery systolic pressure.  3. There is a calcified nodule on anterior leaflet basilar surface. The  mitral valve is degenerative. No evidence of mitral valve regurgitation.  No evidence of mitral stenosis.  4. The aortic valve is tricuspid. Aortic valve regurgitation is trivial.  Mild to moderate aortic valve sclerosis/calcification is present, without  any evidence of aortic stenosis.  5. Aortic dilatation noted. 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  6. The inferior vena cava is normal in size with greater than 50%  respiratory variability, suggesting right atrial pressure of 3 mmHg.   EKG:  EKG is ordered today.  The ekg ordered today demonstrates NSR, low voltage, HR 93  Recent Labs: 12/21/2019: TSH 2.015 05/06/2020: ALT 12; BUN 43; Creatinine 1.7; Hemoglobin 14.3; Platelets 191; Potassium 4.4; Sodium 141  Recent Lipid Panel    Component Value Date/Time   CHOL 148 01/02/2020 1344   TRIG 191 (H) 01/02/2020 1344   TRIG 373 (HH) 04/27/2006 1123   HDL 47 01/02/2020 1344   CHOLHDL 3.1 01/02/2020 1344   VLDL 44.6 (H) 07/29/2011 1322   LDLCALC 72 01/02/2020 1344   LDLDIRECT 75.0 01/16/2019  1154    Physical Exam:    VS:  BP 130/76   Pulse 93   Ht 5' 10.5" (1.791  m)   Wt 238 lb (108 kg)   BMI 33.67 kg/m     Wt Readings from Last 3 Encounters:  06/20/20 238 lb (108 kg)  06/06/20 262 lb (118.8 kg)  04/10/20 242 lb 9.6 oz (110 kg)     GEN: Obese AA male,  well developed in no acute distress HEENT: Normal NECK: No JVD; No carotid bruits CARDIAC: RRR, no murmurs, rubs, gallops RESPIRATORY:  Clear to auscultation without rales, wheezing or rhonchi  ABDOMEN: Soft,  non-distended MUSCULOSKELETAL:  No edema; No deformity  SKIN: Warm and dry NEUROLOGIC:  Alert and oriented x 3 PSYCHIATRIC:  Normal affect   ASSESSMENT:    Pre-operative clearance Patient is a moderate but acceptable risk to come off aspirin and Plavix for his procedure.  This is a low risk procedure from a cardiac standpoint.  In general we would prefer him to remain on aspirin 81 mg if possible.  If needed the aspirin can be held 3 to 5 days preop, the Plavix 5 to 7 days preop.  CAD -s/p DES [facility] marginal vessel [facility] H/O OM PCI 2014. Abnormal Myoview Aug 2019- medical Rx secondary to CRI  Essential hypertension Controlled  Renal transplant recipient 2019- followed by [facility]- he is not currently on dialysis  Thoracic aortic aneurysm ([place]) 4.8 cm Dec 2021- f/u Q 6 months  PLAN:    F/U Almyra Deforest in 3 months .  I'll send pre op clearance to [facility].   Medication Adjustments/Labs and Tests Ordered: Current medicines are reviewed at length with the patient today.  Concerns regarding medicines are outlined above.  Orders Placed This Encounter  Procedures  . EKG 12-Lead   No orders of the defined types were placed in this encounter.   Patient Instructions  Medication Instructions:  No Changes *If you need a refill on your cardiac medications before your next appointment, please call your pharmacy*   Lab Work: No Labs If you have labs (blood work) drawn today and your tests are completely normal, you will receive your results only  by: [name] MyChart Message (if you have MyChart) OR . A paper copy in the mail If you have any lab test that is abnormal or we need to change your treatment, we will call you to review the results.   Testing/Procedures: No testing   Follow-Up: At [facility], you and your health needs are our priority.  As part of our continuing mission to provide you with exceptional heart care, we have created designated Provider Care Teams.  These Care Teams include your primary Cardiologist (physician) and Advanced Practice Providers (APPs -  Physician Assistants and Nurse Practitioners) who all work together to provide you with the care you need, when you need it.  Your next appointment:   1 year(s)  The format for your next appointment:   In Person  Provider:   Kirk Ruths, MD       Signed, Kerin Ransom, PA-C  06/20/2020 11:25 AM    [place]

## 2020-06-20 NOTE — Telephone Encounter (Signed)
   Primary Cardiologist: Kirk Ruths, MD  Chart reviewed and patient seen in the office today as part of pre-operative protocol coverage. Given past medical history and time since last visit, based on ACC/AHA guidelines, [name] would be at moderate but acceptable risk for the planned procedure without further cardiovascular testing.   We would prefer the patient remain on aspirin peri op but if it needs to be held would recommend a 3-5 day hold.  OK to hold Plavix 5-7 days pre op.   The patient was advised that if he develops new symptoms prior to surgery to contact our office to arrange for a follow-up visit, and he verbalized understanding.  I will route this recommendation to the requesting party via Epic fax function and remove from pre-op pool.  Please call with questions.  Kerin Ransom, PA-C 06/20/2020, 11:29 AM

## 2020-06-20 NOTE — Assessment & Plan Note (Signed)
4.8 cm Dec 2021- f/u Q 3 months

## 2020-06-20 NOTE — Patient Instructions (Signed)
Medication Instructions:  No Changes *If you need a refill on your cardiac medications before your next appointment, please call your pharmacy*   Lab Work: No Labs If you have labs (blood work) drawn today and your tests are completely normal, you will receive your results only by: [name] MyChart Message (if you have MyChart) OR . A paper copy in the mail If you have any lab test that is abnormal or we need to change your treatment, we will call you to review the results.   Testing/Procedures: No testing   Follow-Up: At [facility], you and your health needs are our priority.  As part of our continuing mission to provide you with exceptional heart care, we have created designated Provider Care Teams.  These Care Teams include your primary Cardiologist (physician) and Advanced Practice Providers (APPs -  Physician Assistants and Nurse Practitioners) who all work together to provide you with the care you need, when you need it.  Your next appointment:   1 year(s)  The format for your next appointment:   In Person  Provider:   Kirk Ruths, MD

## 2020-06-20 NOTE — Assessment & Plan Note (Signed)
2019- followed by [facility]- he is not currently on dialysis

## 2020-06-20 NOTE — Assessment & Plan Note (Signed)
Patient is a moderate but acceptable risk to come off aspirin and Plavix for his procedure.  This is a low risk procedure from a cardiac standpoint.  In general we would prefer him to remain on aspirin 81 mg if possible.  If needed the aspirin can be held 3 to 5 days preop, the Plavix 5 to 7 days preop.

## 2020-06-20 NOTE — Telephone Encounter (Signed)
Patient aware of 3 mos appointment with [facility] Men, PA scheduled Wednesday 08/21/20 at 1:45 pm.  Will mail information to patient.

## 2020-06-20 NOTE — Assessment & Plan Note (Signed)
Controlled.  

## 2020-06-24 ENCOUNTER — Other Ambulatory Visit: Payer: Self-pay

## 2020-06-24 ENCOUNTER — Ambulatory Visit
Admission: RE | Admit: 2020-06-24 | Discharge: 2020-06-24 | Disposition: A | Discharge status: Home / Self Care | Payer: Medicare Other | Source: Ambulatory Visit | Attending: Family Medicine | Admitting: Family Medicine

## 2020-06-24 DIAGNOSIS — M5441 Lumbago with sciatica, right side: Secondary | ICD-10-CM

## 2020-06-24 DIAGNOSIS — M47817 Spondylosis without myelopathy or radiculopathy, lumbosacral region: Secondary | ICD-10-CM | POA: Diagnosis not present

## 2020-06-24 DIAGNOSIS — M5416 Radiculopathy, lumbar region: Secondary | ICD-10-CM

## 2020-06-24 DIAGNOSIS — M5442 Lumbago with sciatica, left side: Secondary | ICD-10-CM

## 2020-06-24 MED ORDER — IOPAMIDOL (ISOVUE-M 200) INJECTION 41%
1.0000 mL | Freq: Once | INTRAMUSCULAR | Status: AC
  Administered 2020-06-24: 1 mL via EPIDURAL

## 2020-06-24 MED ORDER — METHYLPREDNISOLONE ACETATE 40 MG/ML INJ SUSP (RADIOLOG
120.0000 mg | Freq: Once | INTRAMUSCULAR | Status: AC
  Administered 2020-06-24: 120 mg via EPIDURAL

## 2020-06-24 NOTE — Discharge Instructions (Signed)

## 2020-07-04 ENCOUNTER — Other Ambulatory Visit: Payer: Self-pay | Admitting: Family Medicine

## 2020-07-06 NOTE — Progress Notes (Signed)
Phone [phone] In person visit   Subjective:   [name] is a 70 y.o. year old very pleasant male patient who presents for/with See problem oriented charting Chief Complaint  Patient presents with  . Fatigue  . Hypertension   This visit occurred during the SARS-CoV-2 public health emergency.  Safety protocols were in place, including screening questions prior to the visit, additional usage of staff PPE, and extensive cleaning of exam room while observing appropriate contact time as indicated for disinfecting solutions.   Past Medical History-  Patient Active Problem List   Diagnosis Date Noted  . Malignant neoplasm of prostate ([place]) 06/09/2019    Priority: High  . PAD (peripheral artery disease) ([place]) 01/16/2019    Priority: High  . Morbid obesity ([place]) 12/04/2017    Priority: High  . Mild cognitive impairment 06/18/2016    Priority: High  . 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 Renal transplant recipient 06/27/2015    Priority: High  . Immunosuppression ([place]) 06/27/2015    Priority: High  . Thoracic aortic aneurysm ([place]) 10/23/2014    Priority: High  . Diabetic neuropathy ([place]) 10/14/2010    Priority: High  . CAD -s/p DES to marginal vessel at [facility] 04/09/2009    Priority: High  . Diabetes mellitus due to underlying condition with diabetic autonomic (poly)neuropathy ([place]) 09/05/2007    Priority: High  . Protrusion of lumbar intervertebral disc     Priority: Medium  . Spinal stenosis of lumbar region 09/21/2018    Priority: Medium  . Hyperlipidemia 01/16/2016    Priority: Medium  . Gout 12/22/2009    Priority: Medium  . Obstructive sleep apnea 07/10/2009    Priority: Medium  . BPH (benign prostatic hyperplasia) 09/25/2008    Priority: Medium  . Low back pain 12/15/2007    Priority: Medium  . Posttraumatic stress disorder 11/03/2007    Priority: Medium  . Essential hypertension  01/17/2007    Priority: Medium  . History of stroke 01/17/2007    Priority: Medium  . GERD (gastroesophageal reflux disease) 05/16/2014    Priority: Low  . Trigger thumb of right hand 01/03/2014    Priority: Low  . Left foot drop 05/02/2013    Priority: Low  . ED (erectile dysfunction) 04/03/2013    Priority: Low  . Risk for falls 01/22/2012    Priority: Low  . Gastroparesis 06/19/2008    Priority: Low  . Allergic rhinitis 03/01/2007    Priority: Low  . Pre-operative clearance 06/20/2020  . H/O iron deficiency anemia 02/10/2012    Medications- reviewed and updated Current Outpatient Medications  Medication Sig Dispense Refill  . acetaminophen (TYLENOL) 500 MG tablet Take 1,000 mg by mouth as needed for mild pain or headache.     . allopurinol (ZYLOPRIM) 100 MG tablet Take 200 mg by mouth daily.    . AMBULATORY NON FORMULARY MEDICATION Rolling Walker.  Foot drop M21.372 1 each 0  . aspirin 81 MG tablet Take 81 mg by mouth at bedtime.    [name] atorvastatin (LIPITOR) 40 MG tablet Take 40 mg by mouth daily.    . clopidogrel (PLAVIX) 75 MG tablet Take 75 mg by mouth daily.    . diclofenac Sodium (VOLTAREN) 1 % GEL Apply 2 g topically 4 (four) times daily. 50 g 0  . furosemide (LASIX) 40 MG tablet Take 1 tablet (40 mg total) by mouth daily as needed for fluid. 30 tablet   .  gabapentin (NEURONTIN) 300 MG capsule Take 2 capsules in morning, 1 capsule in afternoon, and 2 capsules at night. (Patient taking differently: Take 300 mg by mouth See admin instructions. Take 2 capsules (600mg ) by mouth in the morning, take 1 capsule (300mg ) by mouth midday, and take 2 capsules (600mg ) by mouth at night.) 150 capsule 5  . insulin aspart protamine- aspart (NOVOLOG MIX 70/30) (70-30) 100 UNIT/ML injection Inject 75 Units into the skin 2 (two) times daily with a meal.     . Insulin Syringe-Needle U-100 (B-D INS SYR ULTRAFINE 1CC/31G) 31G X 5/16" 1 ML MISC Used to give insulin injections twice daily. 100  each 11  . lidocaine (LIDODERM) 5 % Place 1 patch onto the skin daily. Remove & Discard patch within 12 hours or as directed by MD 30 patch 0  . Magnesium 200 MG TABS Take 200 mg by mouth every evening.     . mycophenolate (MYFORTIC) 180 MG EC tablet Take 180-360 mg by mouth See admin instructions. Take 2 tablets (360mg ) by mouth in the morning and 1 tablet (180mg ) by mouth at night.    . nitroGLYCERIN (NITROSTAT) 0.4 MG SL tablet PLACE 1 TABLET (0.4 MG TOTAL) UNDER THE TONGUE EVERY 5 (FIVE) MINUTES AS NEEDED FOR CHEST PAIN. 25 tablet 2  . nortriptyline (PAMELOR) 50 MG capsule TAKE 2 CAPSULES (100 MG TOTAL) BY MOUTH AT BEDTIME. 180 capsule 2  . omeprazole (PRILOSEC) 20 MG capsule TAKE 1 CAPSULE (20 MG TOTAL) BY MOUTH 2 (TWO) TIMES DAILY BEFORE A MEAL. 180 capsule 3  . Semaglutide,0.25 or 0.5MG /DOS, (OZEMPIC, 0.25 OR 0.5 MG/DOSE,) 2 MG/1.5ML SOPN Inject 1 mg into the skin once a week.     . tacrolimus (PROGRAF) 5 MG capsule Take 5 mg by mouth 2 (two) times daily.     . Vitamin D, Ergocalciferol, (DRISDOL) 1.25 MG (50000 UNIT) CAPS capsule Take 50,000 Units by mouth 2 (two) times a week.    . AMBULATORY NON FORMULARY MEDICATION Ankle Foot Orthosis  Foot Drop M21.372 Disp 1 (Patient not taking: Reported on 07/08/2020) 1 each 0   No current facility-administered medications for this visit.     Objective:  BP 106/68   Pulse 96   Temp 98.3 F (36.8 C) (Temporal)   Ht 5\' 11"  (1.803 m)   Wt 226 lb 9.6 oz (102.8 kg)   SpO2 97%   BMI 31.60 kg/m  Gen: NAD, resting comfortably CV: RRR no murmurs rubs or gallops Lungs: CTAB no crackles, wheeze, rhonchi Abdomen: soft/nontender/nondistended/normal bowel sounds.  Ext: trace edema Skin: warm, dry Neuro: seated in rollator walker through visit    Assessment and Plan   # Fatigue/history of falls/generalized weakness S:patient reports ongoing fatigue without recent significant change.   feels like hasnt slept in 4-5 months despite sleeping well  each night. Feels tired. Doesn't do much during the day but remains fatigued. At night feet get really heavy- has to get help getting out of bed- daughter helping him. As soon as he gets feet out feels he can walk ok with walker but he feels unstable.   Last visit did stop tamsulosin and diazepam (Dr. Georgina Snell per patient said he needed the diazepam for muscle spasms with back pain)  Feels he can't do much for himself at this point- daughter has to help a lot. He is able to go to bathroom by himself for most part- but has had some close to falls. He would like to get rails added to shower  and one by toilet.    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 Has not bene checking BP at home but can start now- see below A/P: no obvious cause on exam/history (BP low normal which could contribute but he believes needs to be on full lasix $RemoveBe'40mg'saObqxzUt$  daily per nephrology. I think Home health PT may help- will update labs to look for cause of fatigue.   - 1 month follow up to check in per patient preference  #hypertension S: medication: off amlodipine. Taking lasix $RemoveBefo'40mg'HsIDJVDpfCl$  daily- nephrology wants this to be taken regularly Home readings #s: just got a new BP kit.  BP Readings from Last 3 Encounters:  07/08/20 106/68  06/24/20 (!) 155/85  06/20/20 130/76  A/P: BP low normal today. Was high just 2 weeks ago. Has been feeling poorly for sometime. I do no tthink low BP is primary cause but did encourage him to follow up with nephrology to see if lasix dose can be adjusted down- particularly with worsening renal function noted on labs  # Diabetes- managed by [facility] medical  S: Medication:remains on insulin  And ozempic Lab Results  Component Value Date   HGBA1C 7.0 (H) 01/02/2020   HGBA1C 8.3 10/18/2019   HGBA1C 10.5 (H) 01/16/2019   A/P: patient reports a1c in January- we have not  received records but sounds like unde r7.5- we will await records  #PSA checks with Dr. Lovena Neighbours so we will defer  #low normal B12- recommended taking b12 1000 mcg daily at home Lab Results  Component Value Date   VITAMINB12 262 03/04/2020   #Vitamin D deficiency S: Medication: takes vitamin D twice a week 50k units- sounds like per nephrology Last vitamin D Lab Results  Component Value Date   VD25OH 98.06 07/08/2020  A/P: high normal- want him to reduce to every other week and also discuss this with nephrology as he reports they started him on high dose vitamin D  #renal cysts from " 03/22/2019 mri abdomen 1. 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 2. Hemorrhagic/proteinaceous 0.9 cm renal cortical lesion in the right lateral portion of the right lower quadrant renal transplant, incompletely characterized on this noncontrast MRI study. 3. Follow-up noncontrast MRI " -we will update MRI at this point of abdomen noncontrast  Recommended follow up: Return in about 1 month (around 08/05/2020). Future Appointments  Date Time Provider [place]  08/08/2020  1:40 PM [name], MD [facility]  08/14/2020  1:45 PM Gardiner Barefoot, DPM [facility]  08/21/2020  1:45 PM Almyra Deforest, PA [facility]  09/05/2020  9:50 AM Pieter Partridge, DO [facility] None    Lab/Order associations:   ICD-10-CM   1. Fatigue, unspecified type  R53.83 CBC with Differential/Platelet    Comprehensive metabolic panel    TSH  2. Essential hypertension  I10 CBC with Differential/Platelet    Comprehensive metabolic panel    TSH  3. Vitamin D deficiency  E55.9 VITAMIN D 25 Hydroxy (Vit-D Deficiency, Fractures)  4. Generalized  weakness  R53.1 Ambulatory referral to Home Health  5. Acquired cyst of kidney  N28.1 MR ABDOMEN WO CONTRAST  6. Immunosuppression (HCC) Chronic-  noted with chronic myfortic and tacolimus due to renal transplant. Continue current meds and nephrology/transplant follow up  D84.9   7. Type 2 diabetes mellitus with vascular disease ([place]) Chronic E11.59     Return precautions advised.  Garret Reddish, MD

## 2020-07-06 NOTE — Patient Instructions (Addendum)
  Health Maintenance Due  Topic Date Due  . COLONOSCOPY - Sign release of information at the check out desk for last colonoscopy from [place]- we have still not received a copy  Never done  . PNA vac Low Risk Adult (2 of 2 - PPSV23)- today pneumovax 23  12/04/2018  . OPHTHALMOLOGY EXAM - please get this scheduled 06/12/2020  . HEMOGLOBIN A1C - Sign release of information at the check out desk for last records from [facility] 07/04/2020   Please start over the counter b12/cyanocobalamin 1000 mcg daily over the counter  Please stop by lab before you go If you have mychart- we will send your results within 3 business days of [place] receiving them.  If you do not have mychart- we will call you about results within 5 business days of [place] receiving them.  *please also note that you will see labs on mychart as soon as they post. I will later go in and write notes on them- will say "notes from Dr. Yong Channel"  We will call you within two weeks about your referral to home health PT as well as MRI abdomen. If you do not hear within 2 weeks, give [place] a call.   Recommended follow up: Return in about 1 month (around 08/05/2020).

## 2020-07-08 ENCOUNTER — Other Ambulatory Visit: Payer: Self-pay

## 2020-07-08 ENCOUNTER — Ambulatory Visit (INDEPENDENT_AMBULATORY_CARE_PROVIDER_SITE_OTHER): Payer: Medicare Other | Admitting: Family Medicine

## 2020-07-08 ENCOUNTER — Encounter: Payer: Self-pay | Admitting: Family Medicine

## 2020-07-08 VITALS — BP 106/68 | HR 96 | Temp 98.3°F | Ht 71.0 in | Wt 226.6 lb

## 2020-07-08 DIAGNOSIS — Z23 Encounter for immunization: Secondary | ICD-10-CM

## 2020-07-08 DIAGNOSIS — N281 Cyst of kidney, acquired: Secondary | ICD-10-CM

## 2020-07-08 DIAGNOSIS — R531 Weakness: Secondary | ICD-10-CM | POA: Diagnosis not present

## 2020-07-08 DIAGNOSIS — R5383 Other fatigue: Secondary | ICD-10-CM

## 2020-07-08 DIAGNOSIS — E559 Vitamin D deficiency, unspecified: Secondary | ICD-10-CM | POA: Diagnosis not present

## 2020-07-08 DIAGNOSIS — I10 Essential (primary) hypertension: Secondary | ICD-10-CM | POA: Diagnosis not present

## 2020-07-08 DIAGNOSIS — D849 Immunodeficiency, unspecified: Secondary | ICD-10-CM

## 2020-07-08 DIAGNOSIS — E1159 Type 2 diabetes mellitus with other circulatory complications: Secondary | ICD-10-CM

## 2020-07-08 LAB — COMPREHENSIVE METABOLIC PANEL
ALT: 13 U/L (ref 0–53)
AST: 11 U/L (ref 0–37)
Albumin: 3.6 g/dL (ref 3.5–5.2)
Alkaline Phosphatase: 94 U/L (ref 39–117)
BUN: 31 mg/dL — ABNORMAL HIGH (ref 6–23)
CO2: 32 mEq/L (ref 19–32)
Calcium: 9.6 mg/dL (ref 8.4–10.5)
Chloride: 95 mEq/L — ABNORMAL LOW (ref 96–112)
Creatinine, Ser: 1.96 mg/dL — ABNORMAL HIGH (ref 0.40–1.50)
GFR: 33.74 mL/min — ABNORMAL LOW (ref >60.00)
Glucose, Bld: 339 mg/dL — ABNORMAL HIGH (ref 70–99)
Potassium: 3.8 mEq/L (ref 3.5–5.1)
Sodium: 136 mEq/L (ref 135–145)
Total Bilirubin: 0.8 mg/dL (ref 0.2–1.2)
Total Protein: 6.9 g/dL (ref 6.0–8.3)

## 2020-07-08 LAB — CBC WITH DIFFERENTIAL/PLATELET
Basophils Absolute: 0 10*3/uL (ref 0.0–0.1)
Basophils Relative: 0.2 % (ref 0.0–3.0)
Eosinophils Absolute: 0 10*3/uL (ref 0.0–0.7)
Eosinophils Relative: 0.8 % (ref 0.0–5.0)
HCT: 44.5 % (ref 39.0–52.0)
Hemoglobin: 14.3 g/dL (ref 13.0–17.0)
Lymphocytes Relative: 6.8 % — ABNORMAL LOW (ref 12.0–46.0)
Lymphs Abs: 0.3 10*3/uL — ABNORMAL LOW (ref 0.7–4.0)
MCHC: 32.1 g/dL (ref 30.0–36.0)
MCV: 85.1 fl (ref 78.0–100.0)
Monocytes Absolute: 0.4 10*3/uL (ref 0.1–1.0)
Monocytes Relative: 6.9 % (ref 3.0–12.0)
Neutro Abs: 4.3 10*3/uL (ref 1.4–7.7)
Neutrophils Relative %: 85.3 % — ABNORMAL HIGH (ref 43.0–77.0)
Platelets: 194 10*3/uL (ref 150.0–400.0)
RBC: 5.23 Mil/uL (ref 4.22–5.81)
RDW: 17.2 % — ABNORMAL HIGH (ref 11.5–15.5)
WBC: 5.1 10*3/uL (ref 4.0–10.5)

## 2020-07-08 LAB — VITAMIN D 25 HYDROXY (VIT D DEFICIENCY, FRACTURES): VITD: 98.06 ng/mL (ref 30.00–100.00)

## 2020-07-08 LAB — TSH: TSH: 2.6 u[IU]/mL (ref 0.35–4.50)

## 2020-07-11 ENCOUNTER — Telehealth: Payer: Self-pay

## 2020-07-11 NOTE — Telephone Encounter (Signed)
[name] from [facility] states she spoke with patients daughter and daughter is taking patient to ED for BP issues and they will be holding off for now on the PT evaluation.

## 2020-07-11 NOTE — Telephone Encounter (Signed)
Thanks for update- glad he is getting car he needs

## 2020-07-11 NOTE — Telephone Encounter (Signed)
FYI

## 2020-07-15 ENCOUNTER — Telehealth: Payer: Self-pay

## 2020-07-15 DIAGNOSIS — D849 Immunodeficiency, unspecified: Secondary | ICD-10-CM | POA: Diagnosis not present

## 2020-07-15 DIAGNOSIS — R5383 Other fatigue: Secondary | ICD-10-CM | POA: Diagnosis not present

## 2020-07-15 DIAGNOSIS — Z794 Long term (current) use of insulin: Secondary | ICD-10-CM | POA: Diagnosis not present

## 2020-07-15 DIAGNOSIS — Z905 Acquired absence of kidney: Secondary | ICD-10-CM | POA: Diagnosis not present

## 2020-07-15 DIAGNOSIS — I10 Essential (primary) hypertension: Secondary | ICD-10-CM | POA: Diagnosis not present

## 2020-07-15 DIAGNOSIS — E559 Vitamin D deficiency, unspecified: Secondary | ICD-10-CM | POA: Diagnosis not present

## 2020-07-15 DIAGNOSIS — E1159 Type 2 diabetes mellitus with other circulatory complications: Secondary | ICD-10-CM | POA: Diagnosis not present

## 2020-07-15 DIAGNOSIS — I70209 Unspecified atherosclerosis of native arteries of extremities, unspecified extremity: Secondary | ICD-10-CM | POA: Diagnosis not present

## 2020-07-15 DIAGNOSIS — Z7901 Long term (current) use of anticoagulants: Secondary | ICD-10-CM | POA: Diagnosis not present

## 2020-07-15 NOTE — Patient Instructions (Incomplete)
Health Maintenance Due  Topic Date Due  . COLONOSCOPY (Pts 45-49yrs Insurance coverage will need to be confirmed)  Never done  . OPHTHALMOLOGY EXAM  06/12/2020  . HEMOGLOBIN A1C  07/04/2020   Depression screen PHQ 2/9 07/08/2020 01/02/2020 06/26/2019  Decreased Interest 0 0 0  Down, Depressed, Hopeless 0 0 0  PHQ - 2 Score 0 0 0  Some recent data might be hidden    

## 2020-07-15 NOTE — Progress Notes (Deleted)
Phone [phone] In person visit   Subjective:   [name] is a 70 y.o. year old very pleasant male patient who presents for/with See problem oriented charting No chief complaint on file.   This visit occurred during the SARS-CoV-2 public health emergency.  Safety protocols were in place, including screening questions prior to the visit, additional usage of staff PPE, and extensive cleaning of exam room while observing appropriate contact time as indicated for disinfecting solutions.   Past Medical History-  Patient Active Problem List   Diagnosis Date Noted  . Pre-operative clearance 06/20/2020  . Protrusion of lumbar intervertebral disc   . Malignant neoplasm of prostate ([place]) 06/09/2019  . PAD (peripheral artery disease) ([place]) 01/16/2019  . Spinal stenosis of lumbar region 09/21/2018  . Morbid obesity ([place]) 12/04/2017  . Mild cognitive impairment 06/18/2016  . Hyperlipidemia 01/16/2016  . Immunosuppressive management encounter following kidney transplant 07/12/2015  . Renal transplant recipient 06/27/2015  . Immunosuppression ([place]) 06/27/2015  . Thoracic aortic aneurysm ([place]) 10/23/2014  . GERD (gastroesophageal reflux disease) 05/16/2014  . Trigger thumb of right hand 01/03/2014  . Left foot drop 05/02/2013  . ED (erectile dysfunction) 04/03/2013  . H/O iron deficiency anemia 02/10/2012  . Risk for falls 01/22/2012  . Diabetic neuropathy ([place]) 10/14/2010  . Gout 12/22/2009  . Obstructive sleep apnea 07/10/2009  . CAD -s/p DES to marginal vessel at [facility] 2014 04/09/2009  . BPH (benign prostatic hyperplasia) 09/25/2008  . Gastroparesis 06/19/2008  . Low back pain 12/15/2007  . Posttraumatic stress disorder 11/03/2007  . Diabetes mellitus due to underlying condition with diabetic autonomic (poly)neuropathy ([place]) 09/05/2007  . Allergic rhinitis 03/01/2007  . Essential hypertension 01/17/2007  . History of stroke 01/17/2007    Medications- reviewed and  updated Current Outpatient Medications  Medication Sig Dispense Refill  . acetaminophen (TYLENOL) 500 MG tablet Take 1,000 mg by mouth as needed for mild pain or headache.     . allopurinol (ZYLOPRIM) 100 MG tablet Take 200 mg by mouth daily.    . AMBULATORY NON FORMULARY MEDICATION Ankle Foot Orthosis  Foot Drop M21.372 Disp 1 (Patient not taking: Reported on 07/08/2020) 1 each 0  . AMBULATORY NON FORMULARY MEDICATION Rolling Walker.  Foot drop M21.372 1 each 0  . aspirin 81 MG tablet Take 81 mg by mouth at bedtime.    [name] atorvastatin (LIPITOR) 40 MG tablet Take 40 mg by mouth daily.    . clopidogrel (PLAVIX) 75 MG tablet Take 75 mg by mouth daily.    . diclofenac Sodium (VOLTAREN) 1 % GEL Apply 2 g topically 4 (four) times daily. 50 g 0  . furosemide (LASIX) 40 MG tablet Take 1 tablet (40 mg total) by mouth daily as needed for fluid. 30 tablet   . gabapentin (NEURONTIN) 300 MG capsule Take 2 capsules in morning, 1 capsule in afternoon, and 2 capsules at night. (Patient taking differently: Take 300 mg by mouth See admin instructions. Take 2 capsules (600mg ) by mouth in the morning, take 1 capsule (300mg ) by mouth midday, and take 2 capsules (600mg ) by mouth at night.) 150 capsule 5  . insulin aspart protamine- aspart (NOVOLOG MIX 70/30) (70-30) 100 UNIT/ML injection Inject 75 Units into the skin 2 (two) times daily with a meal.     . Insulin Syringe-Needle U-100 (B-D INS SYR ULTRAFINE 1CC/31G) 31G X 5/16" 1 ML MISC Used to give insulin injections twice daily. 100 each 11  . lidocaine (LIDODERM) 5 % Place 1 patch onto  the skin daily. Remove & Discard patch within 12 hours or as directed by MD 30 patch 0  . Magnesium 200 MG TABS Take 200 mg by mouth every evening.     . mycophenolate (MYFORTIC) 180 MG EC tablet Take 180-360 mg by mouth See admin instructions. Take 2 tablets (360mg ) by mouth in the morning and 1 tablet (180mg ) by mouth at night.    . nitroGLYCERIN (NITROSTAT) 0.4 MG SL tablet PLACE  1 TABLET (0.4 MG TOTAL) UNDER THE TONGUE EVERY 5 (FIVE) MINUTES AS NEEDED FOR CHEST PAIN. 25 tablet 2  . nortriptyline (PAMELOR) 50 MG capsule TAKE 2 CAPSULES (100 MG TOTAL) BY MOUTH AT BEDTIME. 180 capsule 2  . omeprazole (PRILOSEC) 20 MG capsule TAKE 1 CAPSULE (20 MG TOTAL) BY MOUTH 2 (TWO) TIMES DAILY BEFORE A MEAL. 180 capsule 3  . Semaglutide,0.25 or 0.5MG /DOS, (OZEMPIC, 0.25 OR 0.5 MG/DOSE,) 2 MG/1.5ML SOPN Inject 1 mg into the skin once a week.     . tacrolimus (PROGRAF) 5 MG capsule Take 5 mg by mouth 2 (two) times daily.     . Vitamin D, Ergocalciferol, (DRISDOL) 1.25 MG (50000 UNIT) CAPS capsule Take 50,000 Units by mouth 2 (two) times a week.     No current facility-administered medications for this visit.     Objective:  There were no vitals taken for this visit. Gen: NAD, resting comfortably CV: RRR no murmurs rubs or gallops Lungs: CTAB no crackles, wheeze, rhonchi Abdomen: soft/nontender/nondistended/normal bowel sounds. No rebound or guarding.  Ext: no edema Skin: warm, dry Neuro: grossly normal, moves all extremities  ***    Assessment and Plan  Discuss Falls S:***  A/P: ***     *** 03/22/2019 mri abdomen- ordered repeat 07/08/20 1. 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 2. Hemorrhagic/proteinaceous 0.9 cm renal cortical lesion in the right lateral portion of the right lower quadrant renal transplant, incompletely characterized on this noncontrast MRI study. 3. Follow-up noncontrast MRI     No problem-specific Assessment & Plan notes found for this encounter.   Recommended follow up: ***No follow-ups on file. Future Appointments  Date Time Provider [place]  07/16/2020  1:40 PM [name], MD [facility]  07/18/2020  1:45 PM [facility] HEALTH COACH [facility]  08/08/2020  1:40 PM [name], MD [facility]  08/14/2020  1:45 PM Gardiner Barefoot, DPM [facility]  08/21/2020  1:45 PM Almyra Deforest, PA [facility]  09/05/2020  9:50 AM Pieter Partridge, DO [facility] None    Lab/Order associations: No diagnosis found.  No orders of the defined types were placed in this encounter.   Time Spent: *** minutes of total time (4:05 PM***- 4:05 PM***) was spent on the date of the encounter performing the following actions: chart review prior to seeing the patient, obtaining history, performing a medically necessary exam, counseling on the treatment plan, placing orders, and documenting in our EHR.   Return precautions advised.  Clyde Lundborg, CMA

## 2020-07-15 NOTE — Telephone Encounter (Signed)
error 

## 2020-07-16 ENCOUNTER — Ambulatory Visit: Payer: Medicare Other | Admitting: Family Medicine

## 2020-07-16 DIAGNOSIS — Z0289 Encounter for other administrative examinations: Secondary | ICD-10-CM

## 2020-07-17 DIAGNOSIS — I70209 Unspecified atherosclerosis of native arteries of extremities, unspecified extremity: Secondary | ICD-10-CM | POA: Diagnosis not present

## 2020-07-17 DIAGNOSIS — R5383 Other fatigue: Secondary | ICD-10-CM | POA: Diagnosis not present

## 2020-07-17 DIAGNOSIS — E1159 Type 2 diabetes mellitus with other circulatory complications: Secondary | ICD-10-CM | POA: Diagnosis not present

## 2020-07-17 DIAGNOSIS — Z905 Acquired absence of kidney: Secondary | ICD-10-CM | POA: Diagnosis not present

## 2020-07-17 DIAGNOSIS — E559 Vitamin D deficiency, unspecified: Secondary | ICD-10-CM | POA: Diagnosis not present

## 2020-07-17 DIAGNOSIS — D849 Immunodeficiency, unspecified: Secondary | ICD-10-CM | POA: Diagnosis not present

## 2020-07-17 DIAGNOSIS — I10 Essential (primary) hypertension: Secondary | ICD-10-CM | POA: Diagnosis not present

## 2020-07-17 DIAGNOSIS — Z794 Long term (current) use of insulin: Secondary | ICD-10-CM | POA: Diagnosis not present

## 2020-07-17 DIAGNOSIS — Z7901 Long term (current) use of anticoagulants: Secondary | ICD-10-CM | POA: Diagnosis not present

## 2020-07-18 ENCOUNTER — Ambulatory Visit (INDEPENDENT_AMBULATORY_CARE_PROVIDER_SITE_OTHER): Payer: Medicare Other

## 2020-07-18 ENCOUNTER — Other Ambulatory Visit: Payer: Self-pay

## 2020-07-18 VITALS — BP 128/76 | HR 95 | Temp 98.7°F | Wt 230.2 lb

## 2020-07-18 DIAGNOSIS — Z0000 Encounter for general adult medical examination without abnormal findings: Secondary | ICD-10-CM

## 2020-07-18 DIAGNOSIS — Z1211 Encounter for screening for malignant neoplasm of colon: Secondary | ICD-10-CM

## 2020-07-18 NOTE — Patient Instructions (Addendum)
[name] , Thank you for taking time to come for your Medicare Wellness Visit. I appreciate your ongoing commitment to your health goals. Please review the following plan we discussed and let me know if I can assist you in the future.   Screening recommendations/referrals: Colonoscopy: ordered placed 07/18/20 Recommended yearly ophthalmology/optometry visit for glaucoma screening and checkup Recommended yearly dental visit for hygiene and checkup  Vaccinations: Influenza vaccine: Done 03/01/20 Pneumococcal vaccine: Up to date Tdap vaccine: Up to date Shingles vaccine: Shingrix discussed. Please contact your pharmacy for coverage information.    Covid-19: Completed   Advanced directives: 2/10, 3/4, & 05/20/20  Conditions/risks identified: none at this time   Next appointment: Follow up in one year for your annual wellness visit.   Preventive Care 90 Years and Older, Male Preventive care refers to lifestyle choices and visits with your health care provider that can promote health and wellness. What does preventive care include?  A yearly physical exam. This is also called an annual well check.  Dental exams once or twice a year.  Routine eye exams. Ask your health care provider how often you should have your eyes checked.  Personal lifestyle choices, including:  Daily care of your teeth and gums.  Regular physical activity.  Eating a healthy diet.  Avoiding tobacco and drug use.  Limiting alcohol use.  Practicing safe sex.  Taking low doses of aspirin every day.  Taking vitamin and mineral supplements as recommended by your health care provider. What happens during an annual well check? The services and screenings done by your health care provider during your annual well check will depend on your age, overall health, lifestyle risk factors, and family history of disease. Counseling  Your health care provider may ask you questions about your:  Alcohol use.  Tobacco  use.  Drug use.  Emotional well-being.  Home and relationship well-being.  Sexual activity.  Eating habits.  History of falls.  Memory and ability to understand (cognition).  Work and work Statistician. Screening  You may have the following tests or measurements:  Height, weight, and BMI.  Blood pressure.  Lipid and cholesterol levels. These may be checked every 5 years, or more frequently if you are over 79 years old.  Skin check.  Lung cancer screening. You may have this screening every year starting at age 59 if you have a 30-pack-year history of smoking and currently smoke or have quit within the past 15 years.  Fecal occult blood test (FOBT) of the stool. You may have this test every year starting at age 1.  Flexible sigmoidoscopy or colonoscopy. You may have a sigmoidoscopy every 5 years or a colonoscopy every 10 years starting at age 80.  Prostate cancer screening. Recommendations will vary depending on your family history and other risks.  Hepatitis C blood test.  Hepatitis B blood test.  Sexually transmitted disease (STD) testing.  Diabetes screening. This is done by checking your blood sugar (glucose) after you have not eaten for a while (fasting). You may have this done every 1-3 years.  Abdominal aortic aneurysm (AAA) screening. You may need this if you are a current or former smoker.  Osteoporosis. You may be screened starting at age 10 if you are at high risk. Talk with your health care provider about your test results, treatment options, and if necessary, the need for more tests. Vaccines  Your health care provider may recommend certain vaccines, such as:  Influenza vaccine. This is recommended every year.  Tetanus, diphtheria, and acellular pertussis (Tdap, Td) vaccine. You may need a Td booster every 10 years.  Zoster vaccine. You may need this after age 11.  Pneumococcal 13-valent conjugate (PCV13) vaccine. One dose is recommended after age  6.  Pneumococcal polysaccharide (PPSV23) vaccine. One dose is recommended after age 69. Talk to your health care provider about which screenings and vaccines you need and how often you need them. This information is not intended to replace advice given to you by your health care provider. Make sure you discuss any questions you have with your health care provider. Document Released: 06/07/2015 Document Revised: 01/29/2016 Document Reviewed: 03/12/2015 Elsevier Interactive Patient Education  2017 [place] Prevention in the Home Falls can cause injuries. They can happen to people of all ages. There are many things you can do to make your home safe and to help prevent falls. What can I do on the outside of my home?  Regularly fix the edges of walkways and driveways and fix any cracks.  Remove anything that might make you trip as you walk through a door, such as a raised step or threshold.  Trim any bushes or trees on the path to your home.  Use bright outdoor lighting.  Clear any walking paths of anything that might make someone trip, such as rocks or tools.  Regularly check to see if handrails are loose or broken. Make sure that both sides of any steps have handrails.  Any raised decks and porches should have guardrails on the edges.  Have any leaves, snow, or ice cleared regularly.  Use sand or salt on walking paths during winter.  Clean up any spills in your garage right away. This includes oil or grease spills. What can I do in the bathroom?  Use night lights.  Install grab bars by the toilet and in the tub and shower. Do not use towel bars as grab bars.  Use non-skid mats or decals in the tub or shower.  If you need to sit down in the shower, use a plastic, non-slip stool.  Keep the floor dry. Clean up any water that spills on the floor as soon as it happens.  Remove soap buildup in the tub or shower regularly.  Attach bath mats securely with double-sided  non-slip rug tape.  Do not have throw rugs and other things on the floor that can make you trip. What can I do in the bedroom?  Use night lights.  Make sure that you have a light by your bed that is easy to reach.  Do not use any sheets or blankets that are too big for your bed. They should not hang down onto the floor.  Have a firm chair that has side arms. You can use this for support while you get dressed.  Do not have throw rugs and other things on the floor that can make you trip. What can I do in the kitchen?  Clean up any spills right away.  Avoid walking on wet floors.  Keep items that you use a lot in easy-to-reach places.  If you need to reach something above you, use a strong step stool that has a grab bar.  Keep electrical cords out of the way.  Do not use floor polish or wax that makes floors slippery. If you must use wax, use non-skid floor wax.  Do not have throw rugs and other things on the floor that can make you trip. What can I do  with my stairs?  Do not leave any items on the stairs.  Make sure that there are handrails on both sides of the stairs and use them. Fix handrails that are broken or loose. Make sure that handrails are as long as the stairways.  Check any carpeting to make sure that it is firmly attached to the stairs. Fix any carpet that is loose or worn.  Avoid having throw rugs at the top or bottom of the stairs. If you do have throw rugs, attach them to the floor with carpet tape.  Make sure that you have a light switch at the top of the stairs and the bottom of the stairs. If you do not have them, ask someone to add them for you. What else can I do to help prevent falls?  Wear shoes that:  Do not have high heels.  Have rubber bottoms.  Are comfortable and fit you well.  Are closed at the toe. Do not wear sandals.  If you use a stepladder:  Make sure that it is fully opened. Do not climb a closed stepladder.  Make sure that both  sides of the stepladder are locked into place.  Ask someone to hold it for you, if possible.  Clearly mark and make sure that you can see:  Any grab bars or handrails.  First and last steps.  Where the edge of each step is.  Use tools that help you move around (mobility aids) if they are needed. These include:  Canes.  Walkers.  Scooters.  Crutches.  Turn on the lights when you go into a dark area. Replace any light bulbs as soon as they burn out.  Set up your furniture so you have a clear path. Avoid moving your furniture around.  If any of your floors are uneven, fix them.  If there are any pets around you, be aware of where they are.  Review your medicines with your doctor. Some medicines can make you feel dizzy. This can increase your chance of falling. Ask your doctor what other things that you can do to help prevent falls. This information is not intended to replace advice given to you by your health care provider. Make sure you discuss any questions you have with your health care provider. Document Released: 03/07/2009 Document Revised: 10/17/2015 Document Reviewed: 06/15/2014 Elsevier Interactive Patient Education  2017 [employer].

## 2020-07-18 NOTE — Progress Notes (Addendum)
Subjective:   [name] is a 70 y.o. male who presents for Medicare Annual/Subsequent preventive examination.  Review of Systems     Cardiac Risk Factors include: advanced age (>55men, >25 women);diabetes mellitus;hypertension;dyslipidemia;male gender;obesity (BMI >30kg/m2)     Objective:    Today's Vitals   07/18/20 1350 07/18/20 1359  BP: (!) 153/94 128/76  Pulse: 95   Temp: 98.7 F (37.1 C)   SpO2: 97%   Weight: 230 lb 3.2 oz (104.4 kg)   PainSc:  10-Worst pain ever   Body mass index is 32.11 kg/m.  Advanced Directives 07/18/2020 03/27/2020 03/10/2020 01/18/2020 12/20/2019 12/19/2019 12/08/2019  Does Patient Have a Medical Advance Directive? Yes Yes No Yes Yes Yes Yes  Type of Industrial/product designer of [place];Out of facility DNR (pink MOST or yellow form);Living will - [place];Living will Living will Living will -  Does patient want to make changes to medical advance directive? - - - - No - Patient declined No - Patient declined -  Copy of [place] in Chart? No - copy requested - - Yes - validated most recent copy scanned in chart (See row information) - - -  Would patient like information on creating a medical advance directive? - - No - Patient declined - - - -    Current Medications (verified) Outpatient Encounter Medications as of 07/18/2020  Medication Sig  . acetaminophen (TYLENOL) 500 MG tablet Take 1,000 mg by mouth as needed for mild pain or headache.   . allopurinol (ZYLOPRIM) 100 MG tablet Take 200 mg by mouth daily.  [name] aspirin 81 MG tablet Take 81 mg by mouth at bedtime.  [name] atorvastatin (LIPITOR) 40 MG tablet Take 40 mg by mouth daily.  . clopidogrel (PLAVIX) 75 MG tablet Take 75 mg by mouth daily.  . diclofenac Sodium (VOLTAREN) 1 % GEL Apply 2 g topically 4 (four) times daily.  . furosemide (LASIX) 40 MG tablet Take 1 tablet (40 mg total) by mouth daily as needed for fluid.   [name] gabapentin (NEURONTIN) 300 MG capsule Take 2 capsules in morning, 1 capsule in afternoon, and 2 capsules at night. (Patient taking differently: Take 300 mg by mouth See admin instructions. Take 2 capsules (600mg ) by mouth in the morning, take 1 capsule (300mg ) by mouth midday, and take 2 capsules (600mg ) by mouth at night.)  . insulin aspart protamine- aspart (NOVOLOG MIX 70/30) (70-30) 100 UNIT/ML injection Inject 75 Units into the skin 2 (two) times daily with a meal.   . Insulin Syringe-Needle U-100 (B-D INS SYR ULTRAFINE 1CC/31G) 31G X 5/16" 1 ML MISC Used to give insulin injections twice daily.  [name] lidocaine (LIDODERM) 5 % Place 1 patch onto the skin daily. Remove & Discard patch within 12 hours or as directed by MD  . Magnesium 200 MG TABS Take 200 mg by mouth every evening.   . mycophenolate (MYFORTIC) 180 MG EC tablet Take 180-360 mg by mouth See admin instructions. Take 2 tablets (360mg ) by mouth in the morning and 1 tablet (180mg ) by mouth at night.  . nitroGLYCERIN (NITROSTAT) 0.4 MG SL tablet PLACE 1 TABLET (0.4 MG TOTAL) UNDER THE TONGUE EVERY 5 (FIVE) MINUTES AS NEEDED FOR CHEST PAIN.  [name] nortriptyline (PAMELOR) 50 MG capsule TAKE 2 CAPSULES (100 MG TOTAL) BY MOUTH AT BEDTIME.  [name] omeprazole (PRILOSEC) 20 MG capsule TAKE 1 CAPSULE (20 MG TOTAL) BY MOUTH 2 (TWO) TIMES DAILY BEFORE A MEAL.  [name]  Semaglutide,0.25 or 0.5MG /DOS, (OZEMPIC, 0.25 OR 0.5 MG/DOSE,) 2 MG/1.5ML SOPN Inject 1 mg into the skin once a week.   . tacrolimus (PROGRAF) 5 MG capsule Take 5 mg by mouth 2 (two) times daily.   . Vitamin D, Ergocalciferol, (DRISDOL) 1.25 MG (50000 UNIT) CAPS capsule Take 50,000 Units by mouth 2 (two) times a week.  . AMBULATORY NON FORMULARY MEDICATION Ankle Foot Orthosis  Foot Drop M21.372 Disp 1 (Patient not taking: No sig reported)  . AMBULATORY NON FORMULARY MEDICATION Rolling Walker.  Foot drop M21.372 (Patient not taking: Reported on 07/18/2020)   No facility-administered encounter  medications on file as of 07/18/2020.    Allergies (verified) Aspirin and Codeine   History: Past Medical History:  Diagnosis Date  . Allergy   . Angina   . Arthritis   . Backache, unspecified   . CHF (congestive heart failure) ([place])   . Chills   . Chronic kidney disease, stage III (moderate) (HCC)    HD T- TH-SAT  . Complication of anesthesia    01/2011 could not eat,, hospt x2, was placed on hd and cleared up  . Coronary atherosclerosis of native coronary artery   . Diabetes mellitus 2004  . Dizziness   . Dysphagia, unspecified(787.20)   . Fall at home 11/2019  . Gastroparesis   . Headache(784.0)   . Hemorrhage of rectum and anus   . Hyperlipidemia   . Hypertrophy of prostate with urinary obstruction and other lower urinary tract symptoms (LUTS)   . INTERNAL HEMORRHOIDS 10/16/2008   Qualifier: Diagnosis of  By: Nolon Rod CMA (AAMA), Robin    . Internal hemorrhoids without mention of complication   . Myocardial infarction ([place]) 2002  . Nausea alone   . Obesity   . Other dyspnea and respiratory abnormality   . Other malaise and fatigue   . Personal history of unspecified circulatory disease   . Posttraumatic stress disorder   . Prostate cancer ([place])   . Sleep apnea    USES CPAP   . Stroke [facility])    2007  . Unspecified essential hypertension    hx htn    Past Surgical History:  Procedure Laterality Date  . ANAL FISSURECTOMY    . AV FISTULA PLACEMENT    . CARDIAC CATHETERIZATION     2005 DR BRODIE  . HEMORRHOID SURGERY    . 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 PROSTATE BIOPSY    . revision of fistula     renal failure  . VENTRAL HERNIA REPAIR  07/01/2011   Procedure: HERNIA REPAIR VENTRAL ADULT;  Surgeon: Joyice Faster. Cornett, MD;  Location: [facility] OR;  Service: General;  Laterality: N/A;   Family History  Problem Relation Age of Onset  . Heart disease Father   . Renal Disease Father   . Dementia Mother   . Heart attack Mother   . Diabetes Sister   . Heart disease Sister    . Diabetes Maternal Aunt   . Diabetes Maternal Uncle   . Diabetes Paternal Aunt   . Diabetes Paternal Uncle   . Heart disease Other   . Heart attack Brother   . Lung cancer Sister   . Renal Disease Brother   . Ovarian cancer Cousin   . Lung cancer Cousin   . Breast cancer Neg Hx   . Colon cancer Neg Hx   . Pancreatic cancer Neg Hx   . Prostate cancer Neg Hx    Social History   Socioeconomic History  .  Marital status: Divorced    Spouse name: Manufacturing engineer  . Number of children: 3  . Years of education: Not on file  . Highest education level: Associate degree: academic program  Occupational History  . Occupation: disabled    Employer: DISABLED  Tobacco Use  . Smoking status: Never Smoker  . Smokeless tobacco: Never Used  Vaping Use  . Vaping Use: Never used  Substance and Sexual Activity  . Alcohol use: No  . Drug use: No  . Sexual activity: Not Currently  Other Topics Concern  . Not on file  Social History Narrative   Married [id number]. 73 year old son in 29. 1 granddaughter from [place].       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 He lives with his wife. He drinks 3-4 cups of coffee a day. He walks most every day.   Social Determinants of Health   Financial Resource Strain: Low Risk   . Difficulty of Paying Living Expenses: Not hard at all  Food Insecurity: No Food Insecurity  . Worried About Charity fundraiser in the Last Year: Never true  . Ran Out of Food in the Last Year: Never true  Transportation Needs: No Transportation Needs  . Lack of Transportation (Medical): No  . Lack of Transportation (Non-Medical): No  Physical Activity: Sufficiently Active  . Days of Exercise per Week: 3 days  . Minutes of Exercise per Session: 60 min  Stress: No Stress Concern Present  . Feeling of Stress : Not at all   Social Connections: Moderately Isolated  . Frequency of Communication with Friends and Family: More than three times a week  . Frequency of Social Gatherings with Friends and Family: Once a week  . Attends Religious Services: More than 4 times per year  . Active Member of Clubs or Organizations: No  . Attends Archivist Meetings: Never  . Marital Status: Divorced    Tobacco Counseling Counseling given: Not Answered   Clinical Intake:  Pre-visit preparation completed: Yes  Pain : 0-10 Pain Score: 10-Worst pain ever Pain Type: Chronic pain Pain Location: Generalized (back feet and legs) Pain Descriptors / Indicators: Heaviness,Aching,Shooting Pain Onset: 1 to 4 weeks ago Pain Frequency: Constant     BMI - recorded: 32.11 Nutritional Status: BMI > 30  Obese Nutritional Risks: Nausea/ vomitting/ diarrhea (nausea) Diabetes: Yes CBG done?: No Did pt. bring in CBG monitor from home?: No  How often do you need to have someone help you when you read instructions, pamphlets, or other written materials from your doctor or pharmacy?: 1 - Never  Diabetic?Nutrition Risk Assessment:  Has the patient had any N/V/D within the last 2 months?  Yes  Does the patient have any non-healing wounds?  No  Has the patient had any unintentional weight loss or weight gain?  No   Diabetes:  Is the patient diabetic?  Yes  If diabetic, was a CBG obtained today?  No  Did the patient bring in their glucometer from home?  No  How often do you monitor your CBG's? N/A.   Financial Strains and Diabetes Management:  Are you having any financial strains with the device, your supplies or your medication? No .  Does the patient want to be seen by Chronic Care Management for management of their diabetes?  No  Would the patient like to be referred to a Nutritionist or for Diabetic Management?  No   Diabetic Exams:  Diabetic Eye Exam: Overdue for diabetic eye exam. Pt has been advised about  the importance in completing this exam. Patient advised to call and schedule an eye exam. Diabetic Foot Exam: Completed 05/07/20   Interpreter Needed?: No  Information entered by :: Charlott Rakes, LPN   Activities of Daily Living In your present state of health, do you have any difficulty performing the following activities: 07/18/2020 12/19/2019  Hearing? N N  Vision? N N  Difficulty concentrating or making decisions? Y N  Comment memory at times -  Walking or climbing stairs? Y Y  Dressing or bathing? Y N  Comment assist with putting shoes on -  Doing errands, shopping? N Y  Conservation officer, nature and eating ? N -  Using the Toilet? N -  In the past six months, have you accidently leaked urine? N -  Do you have problems with loss of bowel control? N -  Managing your Medications? N -  Managing your Finances? N -  Housekeeping or managing your Housekeeping? N -  Some recent data might be hidden    Patient Care Team: [name], MD as PCP - General (Family Medicine) Stanford Breed Denice Bors, MD as PCP - Cardiology (Cardiology) Foye Spurling, MD (Inactive) as Consulting Physician (Internal Medicine) Jamal Maes, MD as Consulting Physician (Nephrology) Tyler Pita, MD as Consulting Physician (Radiation Oncology) Ceasar Mons, MD as Consulting Physician (Urology) Pieter Partridge, DO as Consulting Physician (Neurology) Gardiner Barefoot, DPM as Consulting Physician (Podiatry) Marybelle Killings, MD as Consulting Physician (Orthopedic Surgery) [facility] as Consulting Physician Madelin Rear, MD as Consulting Physician (Endocrinology)  Indicate any recent [place] you may have received from other than [facility] providers in the past year (date may be approximate).     Assessment:   This is a routine wellness examination for [place].  Hearing/Vision screen  Hearing Screening   125Hz  250Hz  500Hz  1000Hz  2000Hz  3000Hz  4000Hz  6000Hz  8000Hz   Right ear:            Left ear:           Comments: Pt denies any hearing at this time  Vision Screening Comments: [facility] for annual eye care  Dietary issues and exercise activities discussed: Current Exercise Habits: Home exercise routine, Time (Minutes): 60, Frequency (Times/Week): 3, Weekly Exercise (Minutes/Week): 180  Goals    . Patient Stated     None at this time      Depression Screen PHQ 2/9 Scores 07/08/2020 01/02/2020 06/26/2019 06/03/2018 01/28/2017 01/16/2016 05/16/2014  PHQ - 2 Score 0 0 0 0 0 0 0    Fall Risk Fall Risk  07/18/2020 03/27/2020 02/29/2020 12/08/2019 06/29/2019  Falls in the past year? 1 1 1 1 1   Number falls in past yr: 1 1 1 1 1   Injury with Fall? 1 0 1 1 0  Comment head and back - - - -  Risk Factor Category  - - - - -  Risk for fall due to : History of fall(s);Impaired balance/gait;Impaired mobility;Impaired vision - - - -  Follow up Falls prevention discussed - - - -    FALL RISK PREVENTION PERTAINING TO THE HOME:  Any stairs in or around the home? Yes  If so, are there any without handrails? No  Home free of loose throw rugs in walkways, pet beds, electrical cords,  etc? Yes  Adequate lighting in your home to reduce risk of falls? Yes   ASSISTIVE DEVICES UTILIZED TO PREVENT FALLS:  Life alert? Yes  Use of a cane, walker or w/c? Yes  Grab bars in the bathroom? No  Shower chair or bench in shower? Yes  Elevated toilet seat or a handicapped toilet? No   TIMED UP AND GO:  Was the test performed? Yes .  Length of time to ambulate 10 feet: 20 sec.   Gait unsteady with use of assistive device, provider informed and education provided.   Cognitive Function:     6CIT Screen 07/18/2020 06/26/2019  What Year? 0 points 0 points  What month? 0 points 0 points  What time? - 0 points  Count back from 20 0 points 0 points  Months in reverse 0 points 0 points  Repeat phrase 0 points 0 points  Total Score - 0    Immunizations Immunization History   Administered Date(s) Administered  . Fluad Quad(high Dose 65+) 01/16/2019, 03/01/2020  . H1N1 02/23/2008  . Influenza Split 02/23/2012, 01/23/2014, 03/05/2015  . Influenza Whole 02/23/2000  . Influenza, High Dose Seasonal PF 05/01/2016  . Influenza-Unspecified 04/24/2005, 04/02/2006, 03/23/2007, 04/26/2008, 02/06/2010, 01/24/2011, 01/24/2012, 02/13/2013, 02/02/2014, 03/07/2014, 02/18/2015, 04/08/2016, 02/15/2017, 12/24/2018  . PFIZER(Purple Top)SARS-COV-2 Vaccination 07/05/2019, 07/27/2019, 05/20/2020  . Pneumococcal Conjugate-13 09/29/2012, 12/03/2017  . Pneumococcal Polysaccharide-23 05/26/2012, 10/25/2012, 07/08/2020  . Pneumococcal-Unspecified 12/27/2004  . Tdap 03/26/2011    TDAP status: Up to date  Flu Vaccine status: Up to date  Pneumococcal vaccine status: Up to date  Covid-19 vaccine status: Completed vaccines  Qualifies for Shingles Vaccine? Yes   Zostavax completed No   Shingrix Completed?: No.    Education has been provided regarding the importance of this vaccine. Patient has been advised to call insurance company to determine out of pocket expense if they have not yet received this vaccine. Advised may also receive vaccine at local pharmacy or Health Dept. Verbalized acceptance and understanding.  Screening Tests Health Maintenance  Topic Date Due  . COLONOSCOPY (Pts 45-43yrs Insurance coverage will need to be confirmed)  Never done  . OPHTHALMOLOGY EXAM  06/12/2020  . HEMOGLOBIN A1C  07/04/2020  . COVID-19 Vaccine (4 - Booster for Pfizer series) 11/18/2020  . TETANUS/TDAP  03/25/2021  . FOOT EXAM  05/07/2021  . INFLUENZA VACCINE  Completed  . Hepatitis C Screening  Completed  . PNA vac Low Risk Adult  Completed    Health Maintenance  Health Maintenance Due  Topic Date Due  . COLONOSCOPY (Pts 45-23yrs Insurance coverage will need to be confirmed)  Never done  . OPHTHALMOLOGY EXAM  06/12/2020  . HEMOGLOBIN A1C  07/04/2020    Colorectal cancer screening:  Referral to GI placed 07/18/20. Pt aware the office will call re: appt.   Additional Screening:  Hepatitis C Screening:  Completed 06/03/18  Vision Screening: Recommended annual ophthalmology exams for early detection of glaucoma and other disorders of the eye. Is the patient up to date with their annual eye exam?  Yes  Who is the provider or what is the name of the office in which the patient attends annual eye exams? Unsure of providers name his office is [place] [address]  If pt is not established with a provider, would they like to be referred to a provider to establish care? No .   Dental Screening: Recommended annual dental exams for proper oral hygiene  Community Resource Referral / Chronic Care Management: CRR required this visit?  No   CCM required this visit?  No      Plan:     I have personally reviewed and noted the following in the patient's chart:   . Medical and social history . Use of alcohol, tobacco or illicit drugs  . Current medications and supplements . Functional ability and status . Nutritional status . Physical activity . Advanced directives . List of other physicians . Hospitalizations, surgeries, and ER visits in previous 12 months . Vitals . Screenings to include cognitive, depression, and falls . Referrals and appointments  In addition, I have reviewed and discussed with patient certain preventive protocols, quality metrics, and best practice recommendations. A written personalized care plan for preventive services as well as general preventive health recommendations were provided to patient.     Willette Brace, LPN   3/71/0626   Nurse Notes: Pt had complaint of headaches since his fall, he is going to make an earlier appt to come in.

## 2020-07-19 DIAGNOSIS — I10 Essential (primary) hypertension: Secondary | ICD-10-CM | POA: Diagnosis not present

## 2020-07-19 DIAGNOSIS — E559 Vitamin D deficiency, unspecified: Secondary | ICD-10-CM | POA: Diagnosis not present

## 2020-07-19 DIAGNOSIS — Z7901 Long term (current) use of anticoagulants: Secondary | ICD-10-CM | POA: Diagnosis not present

## 2020-07-19 DIAGNOSIS — Z905 Acquired absence of kidney: Secondary | ICD-10-CM | POA: Diagnosis not present

## 2020-07-19 DIAGNOSIS — E1159 Type 2 diabetes mellitus with other circulatory complications: Secondary | ICD-10-CM | POA: Diagnosis not present

## 2020-07-19 DIAGNOSIS — R5383 Other fatigue: Secondary | ICD-10-CM | POA: Diagnosis not present

## 2020-07-19 DIAGNOSIS — I70209 Unspecified atherosclerosis of native arteries of extremities, unspecified extremity: Secondary | ICD-10-CM | POA: Diagnosis not present

## 2020-07-19 DIAGNOSIS — Z794 Long term (current) use of insulin: Secondary | ICD-10-CM | POA: Diagnosis not present

## 2020-07-19 DIAGNOSIS — D849 Immunodeficiency, unspecified: Secondary | ICD-10-CM | POA: Diagnosis not present

## 2020-07-19 NOTE — Progress Notes (Signed)
I have reviewed and agree with note, evaluation, plan.   I agree patient should schedule follow-up for headache-glad he was encouraged.  He no showed for an appointment on February 22 unfortunately  Garret Reddish, MD

## 2020-07-22 ENCOUNTER — Telehealth: Payer: Self-pay

## 2020-07-22 DIAGNOSIS — E1159 Type 2 diabetes mellitus with other circulatory complications: Secondary | ICD-10-CM | POA: Diagnosis not present

## 2020-07-22 DIAGNOSIS — I70209 Unspecified atherosclerosis of native arteries of extremities, unspecified extremity: Secondary | ICD-10-CM | POA: Diagnosis not present

## 2020-07-22 DIAGNOSIS — R5383 Other fatigue: Secondary | ICD-10-CM | POA: Diagnosis not present

## 2020-07-22 DIAGNOSIS — Z905 Acquired absence of kidney: Secondary | ICD-10-CM | POA: Diagnosis not present

## 2020-07-22 DIAGNOSIS — E559 Vitamin D deficiency, unspecified: Secondary | ICD-10-CM | POA: Diagnosis not present

## 2020-07-22 DIAGNOSIS — I10 Essential (primary) hypertension: Secondary | ICD-10-CM | POA: Diagnosis not present

## 2020-07-22 DIAGNOSIS — Z7901 Long term (current) use of anticoagulants: Secondary | ICD-10-CM | POA: Diagnosis not present

## 2020-07-22 DIAGNOSIS — D849 Immunodeficiency, unspecified: Secondary | ICD-10-CM | POA: Diagnosis not present

## 2020-07-22 DIAGNOSIS — Z794 Long term (current) use of insulin: Secondary | ICD-10-CM | POA: Diagnosis not present

## 2020-07-22 NOTE — Telephone Encounter (Signed)
Caller is from [facility] and would like to discuss a mutual pt falling twice yesterday.

## 2020-07-22 NOTE — Telephone Encounter (Signed)
Betsy states pt fell both times in the bathroom yesterday slipping on a rug and has some swelling and tenderness in his right lumbar region and down to hips, c/o HA from fall a week ago and wants to know if there is anything that pt needs to have done or if pt needs to be seen again?

## 2020-07-22 NOTE — Telephone Encounter (Signed)
Called and lm on Betsy vm tcb.

## 2020-07-23 ENCOUNTER — Other Ambulatory Visit: Payer: Self-pay | Admitting: Neurology

## 2020-07-23 NOTE — Telephone Encounter (Signed)
Called and spoke with [facility] and relayed below message.

## 2020-07-23 NOTE — Telephone Encounter (Signed)
Patient no showed on February 22.  He does need to be seen if having headaches after a fall-if worsening symptoms can seek care in emergency room but it looks like we are scheduled on March 4

## 2020-07-24 DIAGNOSIS — Z905 Acquired absence of kidney: Secondary | ICD-10-CM | POA: Diagnosis not present

## 2020-07-24 DIAGNOSIS — D849 Immunodeficiency, unspecified: Secondary | ICD-10-CM | POA: Diagnosis not present

## 2020-07-24 DIAGNOSIS — I70209 Unspecified atherosclerosis of native arteries of extremities, unspecified extremity: Secondary | ICD-10-CM | POA: Diagnosis not present

## 2020-07-24 DIAGNOSIS — E559 Vitamin D deficiency, unspecified: Secondary | ICD-10-CM | POA: Diagnosis not present

## 2020-07-24 DIAGNOSIS — Z794 Long term (current) use of insulin: Secondary | ICD-10-CM | POA: Diagnosis not present

## 2020-07-24 DIAGNOSIS — I10 Essential (primary) hypertension: Secondary | ICD-10-CM | POA: Diagnosis not present

## 2020-07-24 DIAGNOSIS — Z7901 Long term (current) use of anticoagulants: Secondary | ICD-10-CM | POA: Diagnosis not present

## 2020-07-24 DIAGNOSIS — R5383 Other fatigue: Secondary | ICD-10-CM | POA: Diagnosis not present

## 2020-07-24 DIAGNOSIS — E1159 Type 2 diabetes mellitus with other circulatory complications: Secondary | ICD-10-CM | POA: Diagnosis not present

## 2020-07-24 NOTE — Patient Instructions (Incomplete)
Health Maintenance Due  Topic Date Due  . COLONOSCOPY (Pts 45-23yrs Insurance coverage will need to be confirmed)  Never done  . OPHTHALMOLOGY EXAM  06/12/2020   Depression screen [facility] 2/9 07/08/2020 01/02/2020 06/26/2019  Decreased Interest 0 0 0  Down, Depressed, Hopeless 0 0 0  PHQ - 2 Score 0 0 0  Some recent data might be hidden

## 2020-07-24 NOTE — Progress Notes (Deleted)
Phone [phone] In person visit   Subjective:   [name] is a 70 y.o. year old very pleasant male patient who presents for/with See problem oriented charting No chief complaint on file.   This visit occurred during the SARS-CoV-2 public health emergency.  Safety protocols were in place, including screening questions prior to the visit, additional usage of staff PPE, and extensive cleaning of exam room while observing appropriate contact time as indicated for disinfecting solutions.   Past Medical History-  Patient Active Problem List   Diagnosis Date Noted  . Pre-operative clearance 06/20/2020  . Protrusion of lumbar intervertebral disc   . Malignant neoplasm of prostate ([place]) 06/09/2019  . PAD (peripheral artery disease) ([place]) 01/16/2019  . Spinal stenosis of lumbar region 09/21/2018  . Morbid obesity ([place]) 12/04/2017  . Mild cognitive impairment 06/18/2016  . Hyperlipidemia 01/16/2016  . Immunosuppressive management encounter following kidney transplant 07/12/2015  . Renal transplant recipient 06/27/2015  . Immunosuppression ([place]) 06/27/2015  . Thoracic aortic aneurysm ([place]) 10/23/2014  . GERD (gastroesophageal reflux disease) 05/16/2014  . Trigger thumb of right hand 01/03/2014  . Left foot drop 05/02/2013  . ED (erectile dysfunction) 04/03/2013  . H/O iron deficiency anemia 02/10/2012  . Risk for falls 01/22/2012  . Diabetic neuropathy ([place]) 10/14/2010  . Gout 12/22/2009  . Obstructive sleep apnea 07/10/2009  . CAD -s/p DES to marginal vessel at [facility] 2014 04/09/2009  . BPH (benign prostatic hyperplasia) 09/25/2008  . Gastroparesis 06/19/2008  . Low back pain 12/15/2007  . Posttraumatic stress disorder 11/03/2007  . 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 Allergic rhinitis 03/01/2007  . Essential hypertension 01/17/2007  . History of stroke 01/17/2007    Medications- reviewed and  updated Current Outpatient Medications  Medication Sig Dispense Refill  . acetaminophen (TYLENOL) 500 MG tablet Take 1,000 mg by mouth as needed for mild pain or headache.     . allopurinol (ZYLOPRIM) 100 MG tablet Take 200 mg by mouth daily.    . AMBULATORY NON FORMULARY MEDICATION Ankle Foot Orthosis  Foot Drop M21.372 Disp 1 (Patient not taking: No sig reported) 1 each 0  . AMBULATORY NON FORMULARY MEDICATION Rolling Walker.  Foot drop M21.372 (Patient not taking: Reported on 07/18/2020) 1 each 0  . aspirin 81 MG tablet Take 81 mg by mouth at bedtime.    [name] atorvastatin (LIPITOR) 40 MG tablet Take 40 mg by mouth daily.    . clopidogrel (PLAVIX) 75 MG tablet Take 75 mg by mouth daily.    . diclofenac Sodium (VOLTAREN) 1 % GEL Apply 2 g topically 4 (four) times daily. 50 g 0  . furosemide (LASIX) 40 MG tablet Take 1 tablet (40 mg total) by mouth daily as needed for fluid. 30 tablet   . gabapentin (NEURONTIN) 300 MG capsule TAKE 2 CAPSULES IN MORNING, 1 CAPSULE IN AFTERNOON, AND 2 CAPSULES AT NIGHT. 150 capsule 0  . insulin aspart protamine- aspart (NOVOLOG MIX 70/30) (70-30) 100 UNIT/ML injection Inject 75 Units into the skin 2 (two) times daily with a meal.     . Insulin Syringe-Needle U-100 (B-D INS SYR ULTRAFINE 1CC/31G) 31G X 5/16" 1 ML MISC Used to give insulin injections twice daily. 100 each 11  . lidocaine (LIDODERM) 5 % Place 1 patch onto the skin daily. Remove & Discard patch within 12 hours or as directed by MD 30 patch 0  . Magnesium 200 MG TABS Take 200 mg by mouth every  evening.     . mycophenolate (MYFORTIC) 180 MG EC tablet Take 180-360 mg by mouth See admin instructions. Take 2 tablets (360mg ) by mouth in the morning and 1 tablet (180mg ) by mouth at night.    . nitroGLYCERIN (NITROSTAT) 0.4 MG SL tablet PLACE 1 TABLET (0.4 MG TOTAL) UNDER THE TONGUE EVERY 5 (FIVE) MINUTES AS NEEDED FOR CHEST PAIN. 25 tablet 2  . nortriptyline (PAMELOR) 50 MG capsule TAKE 2 CAPSULES (100 MG  TOTAL) BY MOUTH AT BEDTIME. 180 capsule 2  . omeprazole (PRILOSEC) 20 MG capsule TAKE 1 CAPSULE (20 MG TOTAL) BY MOUTH 2 (TWO) TIMES DAILY BEFORE A MEAL. 180 capsule 3  . Semaglutide,0.25 or 0.5MG /DOS, (OZEMPIC, 0.25 OR 0.5 MG/DOSE,) 2 MG/1.5ML SOPN Inject 1 mg into the skin once a week.     . tacrolimus (PROGRAF) 5 MG capsule Take 5 mg by mouth 2 (two) times daily.     . Vitamin D, Ergocalciferol, (DRISDOL) 1.25 MG (50000 UNIT) CAPS capsule Take 50,000 Units by mouth 2 (two) times a week.     No current facility-administered medications for this visit.     Objective:  There were no vitals taken for this visit. Gen: NAD, resting comfortably CV: RRR no murmurs rubs or gallops Lungs: CTAB no crackles, wheeze, rhonchi Abdomen: soft/nontender/nondistended/normal bowel sounds. No rebound or guarding.  Ext: no edema Skin: warm, dry Neuro: grossly normal, moves all extremities  ***    Assessment and Plan  1 Month F/U S:***  A/P: ***     *** 03/22/2019 mri abdomen- ordered repeat 07/08/20 1. 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 2. Hemorrhagic/proteinaceous 0.9 cm renal cortical lesion in the right lateral portion of the right lower quadrant renal transplant, incompletely characterized on this noncontrast MRI study. 3. Follow-up noncontrast MRI     No problem-specific Assessment & Plan notes found for this encounter.   Recommended follow up: ***No follow-ups on file. Future Appointments  Date Time Provider [place]  07/26/2020  2:40 PM [name], MD [facility]  08/14/2020  1:45 PM Gardiner Barefoot, DPM [facility]  08/21/2020  1:45 PM Almyra Deforest, PA [facility]  09/05/2020  9:50 AM Pieter Partridge, DO [facility] None  07/28/2021  2:30 PM [facility]    Lab/Order associations: No diagnosis found.  No orders of the defined types were placed in this encounter.   Time Spent: *** minutes of total time (6:52 PM***- 6:52 PM***) was spent on the date of the encounter performing the following actions: chart review prior to seeing the patient, obtaining history, performing a medically necessary exam, counseling on the treatment plan, placing orders, and documenting in our EHR.   Return precautions advised.  Clyde Lundborg, CMA

## 2020-07-25 DIAGNOSIS — Z794 Long term (current) use of insulin: Secondary | ICD-10-CM | POA: Diagnosis not present

## 2020-07-25 DIAGNOSIS — I10 Essential (primary) hypertension: Secondary | ICD-10-CM | POA: Diagnosis not present

## 2020-07-25 DIAGNOSIS — Z905 Acquired absence of kidney: Secondary | ICD-10-CM | POA: Diagnosis not present

## 2020-07-25 DIAGNOSIS — Z7901 Long term (current) use of anticoagulants: Secondary | ICD-10-CM | POA: Diagnosis not present

## 2020-07-25 DIAGNOSIS — I70209 Unspecified atherosclerosis of native arteries of extremities, unspecified extremity: Secondary | ICD-10-CM | POA: Diagnosis not present

## 2020-07-25 DIAGNOSIS — E559 Vitamin D deficiency, unspecified: Secondary | ICD-10-CM | POA: Diagnosis not present

## 2020-07-25 DIAGNOSIS — R5383 Other fatigue: Secondary | ICD-10-CM | POA: Diagnosis not present

## 2020-07-25 DIAGNOSIS — E1159 Type 2 diabetes mellitus with other circulatory complications: Secondary | ICD-10-CM | POA: Diagnosis not present

## 2020-07-25 DIAGNOSIS — D849 Immunodeficiency, unspecified: Secondary | ICD-10-CM | POA: Diagnosis not present

## 2020-07-26 ENCOUNTER — Ambulatory Visit: Payer: Medicare Other | Admitting: Family Medicine

## 2020-07-26 DIAGNOSIS — E1159 Type 2 diabetes mellitus with other circulatory complications: Secondary | ICD-10-CM

## 2020-07-30 ENCOUNTER — Telehealth: Payer: Self-pay

## 2020-07-30 DIAGNOSIS — I70209 Unspecified atherosclerosis of native arteries of extremities, unspecified extremity: Secondary | ICD-10-CM | POA: Diagnosis not present

## 2020-07-30 DIAGNOSIS — E1159 Type 2 diabetes mellitus with other circulatory complications: Secondary | ICD-10-CM | POA: Diagnosis not present

## 2020-07-30 DIAGNOSIS — E559 Vitamin D deficiency, unspecified: Secondary | ICD-10-CM | POA: Diagnosis not present

## 2020-07-30 DIAGNOSIS — D849 Immunodeficiency, unspecified: Secondary | ICD-10-CM | POA: Diagnosis not present

## 2020-07-30 DIAGNOSIS — Z794 Long term (current) use of insulin: Secondary | ICD-10-CM | POA: Diagnosis not present

## 2020-07-30 DIAGNOSIS — Z7901 Long term (current) use of anticoagulants: Secondary | ICD-10-CM | POA: Diagnosis not present

## 2020-07-30 DIAGNOSIS — R5383 Other fatigue: Secondary | ICD-10-CM | POA: Diagnosis not present

## 2020-07-30 DIAGNOSIS — I10 Essential (primary) hypertension: Secondary | ICD-10-CM | POA: Diagnosis not present

## 2020-07-30 DIAGNOSIS — Z905 Acquired absence of kidney: Secondary | ICD-10-CM | POA: Diagnosis not present

## 2020-07-30 DIAGNOSIS — E118 Type 2 diabetes mellitus with unspecified complications: Secondary | ICD-10-CM | POA: Diagnosis not present

## 2020-07-30 NOTE — Telephone Encounter (Signed)
X-ray of what?  I do not mind doing an at home x-ray of the areas that are particularly pain such as if he has significant knee pain or wrist pain-ideally he should be evaluated but has missed 2 consecutive visits I believe-really needs to keep his visit on the 21st

## 2020-07-30 NOTE — Telephone Encounter (Signed)
Please advise 

## 2020-07-30 NOTE — Telephone Encounter (Signed)
Gwinda Passe is calling in from [facility] is calling in wondering if Dr.Hunter would approve an at home x-ray, he has had several falls last week and missed a couple appointments, he is also complaining of being in pan and refusing to go to ED.

## 2020-07-31 NOTE — Telephone Encounter (Signed)
Betsy from [facility] returned call. Her number is [phone]

## 2020-07-31 NOTE — Telephone Encounter (Signed)
May order lumbar spine x-ray under low back pain and right hip x-ray under right hip pain

## 2020-07-31 NOTE — Telephone Encounter (Signed)
Spoke with [place] and gave her the verbal order for the xray's. She gave a verbal understanding.

## 2020-07-31 NOTE — Telephone Encounter (Signed)
Called and spoke with [facility] and she said the lumbar area and right hip is what they would like the xray of.

## 2020-08-02 DIAGNOSIS — M545 Low back pain, unspecified: Secondary | ICD-10-CM | POA: Diagnosis not present

## 2020-08-02 DIAGNOSIS — M25551 Pain in right hip: Secondary | ICD-10-CM | POA: Diagnosis not present

## 2020-08-05 DIAGNOSIS — E559 Vitamin D deficiency, unspecified: Secondary | ICD-10-CM | POA: Diagnosis not present

## 2020-08-05 DIAGNOSIS — E1159 Type 2 diabetes mellitus with other circulatory complications: Secondary | ICD-10-CM | POA: Diagnosis not present

## 2020-08-05 DIAGNOSIS — R5383 Other fatigue: Secondary | ICD-10-CM | POA: Diagnosis not present

## 2020-08-05 DIAGNOSIS — Z7901 Long term (current) use of anticoagulants: Secondary | ICD-10-CM | POA: Diagnosis not present

## 2020-08-05 DIAGNOSIS — Z905 Acquired absence of kidney: Secondary | ICD-10-CM | POA: Diagnosis not present

## 2020-08-05 DIAGNOSIS — I70209 Unspecified atherosclerosis of native arteries of extremities, unspecified extremity: Secondary | ICD-10-CM | POA: Diagnosis not present

## 2020-08-05 DIAGNOSIS — I10 Essential (primary) hypertension: Secondary | ICD-10-CM | POA: Diagnosis not present

## 2020-08-05 DIAGNOSIS — Z794 Long term (current) use of insulin: Secondary | ICD-10-CM | POA: Diagnosis not present

## 2020-08-05 DIAGNOSIS — D849 Immunodeficiency, unspecified: Secondary | ICD-10-CM | POA: Diagnosis not present

## 2020-08-05 NOTE — Progress Notes (Deleted)
   I, Peterson Lombard, LAT, ATC acting as a scribe for Lynne Leader, MD.  [name] is a 70 y.o. male who presents to [place] at [facility] today for continued low back pain and to review his lumbar MRI. Pt was last seen by Dr. Georgina Snell on 06/06/20 for R-side low back and hip pain w/ associated fecal incontinence and was advised to obtain a stat MRI. Pt was later referred for an epidural steroid injection on 06/24/20. He was referred to home health PT by his PCP on 07/08/20.  Pt has had several falls recently as noted in telephone note from [facility] on 07/30/20.  Since his last visit, pt reports //  Dx imaging: 06/12/20 L-spine MRI  06/06/20 L-spine XR  12/19/19 L-spine MRI  Pertinent review of systems: ***  Relevant historical information: ***   Exam:  There were no vitals taken for this visit. General: Well Developed, well nourished, and in no acute distress.   MSK: ***    Lab and Radiology Results No results found for this or any previous visit (from the past 72 hour(s)). No results found.     Assessment and Plan: 70 y.o. male with ***   PDMP not reviewed this encounter. No orders of the defined types were placed in this encounter.  No orders of the defined types were placed in this encounter.    Discussed warning signs or symptoms. Please see discharge instructions. Patient expresses understanding.   ***

## 2020-08-06 ENCOUNTER — Ambulatory Visit: Payer: Medicare Other | Admitting: Family Medicine

## 2020-08-07 DIAGNOSIS — E1159 Type 2 diabetes mellitus with other circulatory complications: Secondary | ICD-10-CM | POA: Diagnosis not present

## 2020-08-07 DIAGNOSIS — Z905 Acquired absence of kidney: Secondary | ICD-10-CM | POA: Diagnosis not present

## 2020-08-07 DIAGNOSIS — E559 Vitamin D deficiency, unspecified: Secondary | ICD-10-CM | POA: Diagnosis not present

## 2020-08-07 DIAGNOSIS — Z794 Long term (current) use of insulin: Secondary | ICD-10-CM | POA: Diagnosis not present

## 2020-08-07 DIAGNOSIS — Z7901 Long term (current) use of anticoagulants: Secondary | ICD-10-CM | POA: Diagnosis not present

## 2020-08-07 DIAGNOSIS — I10 Essential (primary) hypertension: Secondary | ICD-10-CM | POA: Diagnosis not present

## 2020-08-07 DIAGNOSIS — R5383 Other fatigue: Secondary | ICD-10-CM | POA: Diagnosis not present

## 2020-08-07 DIAGNOSIS — I70209 Unspecified atherosclerosis of native arteries of extremities, unspecified extremity: Secondary | ICD-10-CM | POA: Diagnosis not present

## 2020-08-07 DIAGNOSIS — D849 Immunodeficiency, unspecified: Secondary | ICD-10-CM | POA: Diagnosis not present

## 2020-08-07 NOTE — Patient Instructions (Incomplete)
Health Maintenance Due  Topic Date Due  . COLONOSCOPY (Pts 45-49yrs Insurance coverage will need to be confirmed)  Never done  . OPHTHALMOLOGY EXAM  06/12/2020  . HEMOGLOBIN A1C  07/04/2020   Depression screen PHQ 2/9 07/08/2020 01/02/2020 06/26/2019  Decreased Interest 0 0 0  Down, Depressed, Hopeless 0 0 0  PHQ - 2 Score 0 0 0  Some recent data might be hidden    

## 2020-08-07 NOTE — Progress Notes (Deleted)
Phone [phone] In person visit   Subjective:   [name] is a 70 y.o. year old very pleasant male patient who presents for/with See problem oriented charting No chief complaint on file.   This visit occurred during the SARS-CoV-2 public health emergency.  Safety protocols were in place, including screening questions prior to the visit, additional usage of staff PPE, and extensive cleaning of exam room while observing appropriate contact time as indicated for disinfecting solutions.   Past Medical History-  Patient Active Problem List   Diagnosis Date Noted  . Pre-operative clearance 06/20/2020  . Protrusion of lumbar intervertebral disc   . Malignant neoplasm of prostate ([place]) 06/09/2019  . PAD (peripheral artery disease) ([place]) 01/16/2019  . Spinal stenosis of lumbar region 09/21/2018  . Morbid obesity ([place]) 12/04/2017  . Mild cognitive impairment 06/18/2016  . Hyperlipidemia 01/16/2016  . Immunosuppressive management encounter following kidney transplant 07/12/2015  . Renal transplant recipient 06/27/2015  . Immunosuppression ([place]) 06/27/2015  . Thoracic aortic aneurysm ([place]) 10/23/2014  . GERD (gastroesophageal reflux disease) 05/16/2014  . Trigger thumb of right hand 01/03/2014  . Left foot drop 05/02/2013  . ED (erectile dysfunction) 04/03/2013  . H/O iron deficiency anemia 02/10/2012  . Risk for falls 01/22/2012  . Diabetic neuropathy ([place]) 10/14/2010  . Gout 12/22/2009  . Obstructive sleep apnea 07/10/2009  . CAD -s/p DES to marginal vessel at [facility] 2014 04/09/2009  . BPH (benign prostatic hyperplasia) 09/25/2008  . Gastroparesis 06/19/2008  . Low back pain 12/15/2007  . Posttraumatic stress disorder 11/03/2007  . Diabetes mellitus due to underlying condition with diabetic autonomic (poly)neuropathy ([place]) 09/05/2007  . Allergic rhinitis 03/01/2007  . Essential hypertension 01/17/2007  . History of stroke 01/17/2007    Medications- reviewed and  updated Current Outpatient Medications  Medication Sig Dispense Refill  . acetaminophen (TYLENOL) 500 MG tablet Take 1,000 mg by mouth as needed for mild pain or headache.     . allopurinol (ZYLOPRIM) 100 MG tablet Take 200 mg by mouth daily.    . AMBULATORY NON FORMULARY MEDICATION Ankle Foot Orthosis  Foot Drop M21.372 Disp 1 (Patient not taking: No sig reported) 1 each 0  . AMBULATORY NON FORMULARY MEDICATION Rolling Walker.  Foot drop M21.372 (Patient not taking: Reported on 07/18/2020) 1 each 0  . aspirin 81 MG tablet Take 81 mg by mouth at bedtime.    [name] atorvastatin (LIPITOR) 40 MG tablet Take 40 mg by mouth daily.    . clopidogrel (PLAVIX) 75 MG tablet Take 75 mg by mouth daily.    . diclofenac Sodium (VOLTAREN) 1 % GEL Apply 2 g topically 4 (four) times daily. 50 g 0  . furosemide (LASIX) 40 MG tablet Take 1 tablet (40 mg total) by mouth daily as needed for fluid. 30 tablet   . gabapentin (NEURONTIN) 300 MG capsule TAKE 2 CAPSULES IN MORNING, 1 CAPSULE IN AFTERNOON, AND 2 CAPSULES AT NIGHT. 150 capsule 0  . insulin aspart protamine- aspart (NOVOLOG MIX 70/30) (70-30) 100 UNIT/ML injection Inject 75 Units into the skin 2 (two) times daily with a meal.     . Insulin Syringe-Needle U-100 (B-D INS SYR ULTRAFINE 1CC/31G) 31G X 5/16" 1 ML MISC Used to give insulin injections twice daily. 100 each 11  . lidocaine (LIDODERM) 5 % Place 1 patch onto the skin daily. Remove & Discard patch within 12 hours or as directed by MD 30 patch 0  . Magnesium 200 MG TABS Take 200 mg by mouth every  evening.     . mycophenolate (MYFORTIC) 180 MG EC tablet Take 180-360 mg by mouth See admin instructions. Take 2 tablets (360mg ) by mouth in the morning and 1 tablet (180mg ) by mouth at night.    . nitroGLYCERIN (NITROSTAT) 0.4 MG SL tablet PLACE 1 TABLET (0.4 MG TOTAL) UNDER THE TONGUE EVERY 5 (FIVE) MINUTES AS NEEDED FOR CHEST PAIN. 25 tablet 2  . nortriptyline (PAMELOR) 50 MG capsule TAKE 2 CAPSULES (100 MG  TOTAL) BY MOUTH AT BEDTIME. 180 capsule 2  . omeprazole (PRILOSEC) 20 MG capsule TAKE 1 CAPSULE (20 MG TOTAL) BY MOUTH 2 (TWO) TIMES DAILY BEFORE A MEAL. 180 capsule 3  . Semaglutide,0.25 or 0.5MG /DOS, (OZEMPIC, 0.25 OR 0.5 MG/DOSE,) 2 MG/1.5ML SOPN Inject 1 mg into the skin once a week.     . tacrolimus (PROGRAF) 5 MG capsule Take 5 mg by mouth 2 (two) times daily.     . Vitamin D, Ergocalciferol, (DRISDOL) 1.25 MG (50000 UNIT) CAPS capsule Take 50,000 Units by mouth 2 (two) times a week.     No current facility-administered medications for this visit.     Objective:  There were no vitals taken for this visit. Gen: NAD, resting comfortably CV: RRR no murmurs rubs or gallops Lungs: CTAB no crackles, wheeze, rhonchi Abdomen: soft/nontender/nondistended/normal bowel sounds. No rebound or guarding.  Ext: no edema Skin: warm, dry Neuro: grossly normal, moves all extremities  ***    Assessment and Plan  *** *** 03/22/2019 mri abdomen- ordered repeat 07/08/20 1. 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 2. Hemorrhagic/proteinaceous 0.9 cm renal cortical lesion in the right lateral portion of the right lower quadrant renal transplant, incompletely characterized on this noncontrast MRI study. 3. Follow-up noncontrast MRI     No problem-specific Assessment & Plan notes found for this encounter.   Recommended follow up: ***No follow-ups on file. Future Appointments  Date Time Provider [place]  08/12/2020  2:40 PM [name], MD [facility]  08/14/2020  1:45 PM Gardiner Barefoot, DPM [facility]  08/21/2020  1:45 PM Almyra Deforest, PA [facility]  09/05/2020  9:50 AM Pieter Partridge, DO [facility] None   07/28/2021  2:30 PM [facility] CO[facility]    Lab/Order associations: No diagnosis found.  No orders of the defined types were placed in this encounter.   Time Spent: *** minutes of total time (7:53 PM***- 7:53 PM***) was spent on the date of the encounter performing the following actions: chart review prior to seeing the patient, obtaining history, performing a medically necessary exam, counseling on the treatment plan, placing orders, and documenting in our EHR.   Return precautions advised.  Clyde Lundborg, CMA

## 2020-08-08 ENCOUNTER — Ambulatory Visit: Payer: Medicare Other | Admitting: Family Medicine

## 2020-08-09 ENCOUNTER — Telehealth: Payer: Self-pay

## 2020-08-09 NOTE — Telephone Encounter (Signed)
Called and gave VO to Will. 

## 2020-08-09 NOTE — Telephone Encounter (Signed)
Will, OT from [facility] is requesting verbal orders to authorize rescheduling occupational therapy initial evaluation to next week due to being unable to reach patient. Please advise.

## 2020-08-12 ENCOUNTER — Telehealth: Payer: Medicare Other | Admitting: Family Medicine

## 2020-08-13 ENCOUNTER — Ambulatory Visit: Payer: Medicare Other | Admitting: Physician Assistant

## 2020-08-13 ENCOUNTER — Other Ambulatory Visit: Payer: Self-pay

## 2020-08-13 ENCOUNTER — Telehealth: Payer: Self-pay

## 2020-08-13 ENCOUNTER — Ambulatory Visit: Payer: Medicare Other | Admitting: Family Medicine

## 2020-08-13 ENCOUNTER — Emergency Department (HOSPITAL_COMMUNITY)
Admission: EM | Admit: 2020-08-13 | Discharge: 2020-08-13 | Disposition: A | Discharge status: Home / Self Care | Payer: No Typology Code available for payment source | Attending: Emergency Medicine | Admitting: Emergency Medicine

## 2020-08-13 ENCOUNTER — Encounter (HOSPITAL_COMMUNITY): Payer: Self-pay | Admitting: Emergency Medicine

## 2020-08-13 ENCOUNTER — Emergency Department (HOSPITAL_COMMUNITY): Payer: No Typology Code available for payment source

## 2020-08-13 DIAGNOSIS — E1122 Type 2 diabetes mellitus with diabetic chronic kidney disease: Secondary | ICD-10-CM | POA: Insufficient documentation

## 2020-08-13 DIAGNOSIS — Z8546 Personal history of malignant neoplasm of prostate: Secondary | ICD-10-CM | POA: Insufficient documentation

## 2020-08-13 DIAGNOSIS — I130 Hypertensive heart and chronic kidney disease with heart failure and stage 1 through stage 4 chronic kidney disease, or unspecified chronic kidney disease: Secondary | ICD-10-CM | POA: Insufficient documentation

## 2020-08-13 DIAGNOSIS — I509 Heart failure, unspecified: Secondary | ICD-10-CM | POA: Diagnosis not present

## 2020-08-13 DIAGNOSIS — Z7982 Long term (current) use of aspirin: Secondary | ICD-10-CM | POA: Diagnosis not present

## 2020-08-13 DIAGNOSIS — Z794 Long term (current) use of insulin: Secondary | ICD-10-CM | POA: Diagnosis not present

## 2020-08-13 DIAGNOSIS — I2510 Atherosclerotic heart disease of native coronary artery without angina pectoris: Secondary | ICD-10-CM | POA: Insufficient documentation

## 2020-08-13 DIAGNOSIS — W182XXA Fall in (into) shower or empty bathtub, initial encounter: Secondary | ICD-10-CM | POA: Diagnosis not present

## 2020-08-13 DIAGNOSIS — Z8673 Personal history of transient ischemic attack (TIA), and cerebral infarction without residual deficits: Secondary | ICD-10-CM | POA: Insufficient documentation

## 2020-08-13 DIAGNOSIS — N183 Chronic kidney disease, stage 3 unspecified: Secondary | ICD-10-CM | POA: Insufficient documentation

## 2020-08-13 DIAGNOSIS — Y92009 Unspecified place in unspecified non-institutional (private) residence as the place of occurrence of the external cause: Secondary | ICD-10-CM | POA: Diagnosis not present

## 2020-08-13 DIAGNOSIS — S0990XA Unspecified injury of head, initial encounter: Secondary | ICD-10-CM | POA: Diagnosis not present

## 2020-08-13 DIAGNOSIS — Z79899 Other long term (current) drug therapy: Secondary | ICD-10-CM | POA: Insufficient documentation

## 2020-08-13 DIAGNOSIS — S199XXA Unspecified injury of neck, initial encounter: Secondary | ICD-10-CM | POA: Diagnosis not present

## 2020-08-13 DIAGNOSIS — Z7902 Long term (current) use of antithrombotics/antiplatelets: Secondary | ICD-10-CM | POA: Insufficient documentation

## 2020-08-13 DIAGNOSIS — S060X0A Concussion without loss of consciousness, initial encounter: Secondary | ICD-10-CM | POA: Diagnosis not present

## 2020-08-13 DIAGNOSIS — E1142 Type 2 diabetes mellitus with diabetic polyneuropathy: Secondary | ICD-10-CM | POA: Insufficient documentation

## 2020-08-13 DIAGNOSIS — W19XXXA Unspecified fall, initial encounter: Secondary | ICD-10-CM

## 2020-08-13 LAB — COMPREHENSIVE METABOLIC PANEL
ALT: 11 U/L (ref 0–44)
AST: 23 U/L (ref 15–41)
Albumin: 3.1 g/dL — ABNORMAL LOW (ref 3.5–5.0)
Alkaline Phosphatase: 99 U/L (ref 38–126)
Anion gap: 7 (ref 5–15)
BUN: 15 mg/dL (ref 8–23)
CO2: 27 mmol/L (ref 22–32)
Calcium: 9.7 mg/dL (ref 8.9–10.3)
Chloride: 102 mmol/L (ref 98–111)
Creatinine, Ser: 1.44 mg/dL — ABNORMAL HIGH (ref 0.61–1.24)
GFR, Estimated: 52 mL/min — ABNORMAL LOW (ref >60)
Glucose, Bld: 238 mg/dL — ABNORMAL HIGH (ref 70–99)
Potassium: 4.3 mmol/L (ref 3.5–5.1)
Sodium: 136 mmol/L (ref 135–145)
Total Bilirubin: 0.7 mg/dL (ref 0.3–1.2)
Total Protein: 6 g/dL — ABNORMAL LOW (ref 6.5–8.1)

## 2020-08-13 LAB — URINALYSIS, ROUTINE W REFLEX MICROSCOPIC
Bilirubin Urine: NEGATIVE
Glucose, UA: 50 mg/dL — AB
Hgb urine dipstick: NEGATIVE
Ketones, ur: NEGATIVE mg/dL
Leukocytes,Ua: NEGATIVE
Nitrite: NEGATIVE
Protein, ur: NEGATIVE mg/dL
Specific Gravity, Urine: 1.02 (ref 1.005–1.030)
pH: 5 (ref 5.0–8.0)

## 2020-08-13 LAB — CBC
HCT: 45.6 % (ref 39.0–52.0)
Hemoglobin: 14.1 g/dL (ref 13.0–17.0)
MCH: 26.8 pg (ref 26.0–34.0)
MCHC: 30.9 g/dL (ref 30.0–36.0)
MCV: 86.7 fL (ref 80.0–100.0)
Platelets: 195 10*3/uL (ref 150–400)
RBC: 5.26 MIL/uL (ref 4.22–5.81)
RDW: 14.6 % (ref 11.5–15.5)
WBC: 3.1 10*3/uL — ABNORMAL LOW (ref 4.0–10.5)
nRBC: 0 % (ref 0.0–0.2)

## 2020-08-13 LAB — CBG MONITORING, ED: Glucose-Capillary: 215 mg/dL — ABNORMAL HIGH (ref 70–99)

## 2020-08-13 NOTE — ED Triage Notes (Signed)
Arrived via EMS from home. Patient fell getting out of the shower general weakness hitting posterior and right side of head. No LOC reported. Today developed intermittent confusion and blurry vision. Alert answering and following commands appropriate. Currently Plavix every day in the AM last dose today.

## 2020-08-13 NOTE — Telephone Encounter (Signed)
Thanks for the update-glad he is getting care

## 2020-08-13 NOTE — Discharge Instructions (Addendum)
You were evaluated in the Emergency Department and after careful evaluation, we did not find any emergent condition requiring admission or further testing in the hospital.  Your work-up today was overall reassuring.  I suspect her symptoms could be due to a mild concussion.  Please see the handout for information on concussion and management.  Please try to avoid reinjury of the head.  Please return to the Emergency Department if you experience any worsening of your condition.  We encourage you to follow up with a primary care provider.  Thank you for allowing [place] to be a part of your care.

## 2020-08-13 NOTE — Telephone Encounter (Signed)
FYI

## 2020-08-13 NOTE — ED Notes (Signed)
Rm 13's daughter Yvetta Coder would like and [phone]

## 2020-08-13 NOTE — Telephone Encounter (Signed)
Patients daughter called in and stated she called EMS for her father since he was really out of it. Cancelled patients appt with daughter.

## 2020-08-13 NOTE — ED Provider Notes (Signed)
[facility] EMERGENCY DEPARTMENT Provider Note   CSN: [id number] Arrival date & time: 08/13/20  1207     History Chief Complaint  Patient presents with  . Fall  . Altered Mental Status  . Eye Problem    [name] is a 70 y.o. male.  HPI 70 year old male with a history of allergies, angina, backache, CHF, CKD, DM type II, hyperlipidemia, MI, stroke presents to the ER after a fall which occurred yesterday.  Patient reports that he has frequent falls secondary to his neuropathy.  States his feet often "get tangled up".  He states he fell coming out of the shower yesterday evening.  He did hit the posterior aspect of his head, but did not lose consciousness.  He is on Plavix.  He states that this morning he woke up with intermittent blurry vision, and intermittent confusion.  Here he is alert and oriented x3, answering questions appropriately.  Denies any neck pain, numbness or tingling.  Denies any slurred speech, vision is currently intact.  States he just feels weak.  Nuys any chest pain, shortness of breath.  Denies any dysuria or hematuria.    Past Medical History:  Diagnosis Date  . Allergy   . Angina   . Arthritis   . Backache, unspecified   . CHF (congestive heart failure) ([place])   . Chills   . Chronic kidney disease, stage III (moderate) (HCC)    HD T- TH-SAT  . Complication of anesthesia    01/2011 could not eat,, hospt x2, was placed on hd and cleared up  . Coronary atherosclerosis of native coronary artery   . Diabetes mellitus 2004  . Dizziness   . Dysphagia, unspecified(787.20)   . Fall at home 11/2019  . Gastroparesis   . Headache(784.0)   . Hemorrhage of rectum and anus   . Hyperlipidemia   . Hypertrophy of prostate with urinary obstruction and other lower urinary tract symptoms (LUTS)   . INTERNAL HEMORRHOIDS 10/16/2008   Qualifier: Diagnosis of  By: Nolon Rod CMA (AAMA), Robin    . Internal hemorrhoids without mention of complication    . Myocardial infarction ([place]) 2002  . Nausea alone   . Obesity   . Other dyspnea and respiratory abnormality   . Other malaise and fatigue   . Personal history of unspecified circulatory disease   . Posttraumatic stress disorder   . Prostate cancer ([place])   . Sleep apnea    USES CPAP   . Stroke [facility])    2007  . Unspecified essential hypertension    hx htn     Patient Active Problem List   Diagnosis Date Noted  . Pre-operative clearance 06/20/2020  . Protrusion of lumbar intervertebral disc   . Malignant neoplasm of prostate ([place]) 06/09/2019  . PAD (peripheral artery disease) ([place]) 01/16/2019  . Spinal stenosis of lumbar region 09/21/2018  . Morbid obesity ([place]) 12/04/2017  . Mild cognitive impairment 06/18/2016  . Hyperlipidemia 01/16/2016  . Immunosuppressive management encounter following kidney transplant 07/12/2015  . Renal transplant recipient 06/27/2015  . Immunosuppression ([place]) 06/27/2015  . Thoracic aortic aneurysm ([place]) 10/23/2014  . GERD (gastroesophageal reflux disease) 05/16/2014  . Trigger thumb of right hand 01/03/2014  . Left foot drop 05/02/2013  . ED (erectile dysfunction) 04/03/2013  . H/O iron deficiency anemia 02/10/2012  . Risk for falls 01/22/2012  . Diabetic neuropathy ([place]) 10/14/2010  . Gout 12/22/2009  . Obstructive sleep apnea 07/10/2009  . CAD -s/p DES to marginal  vessel at [facility] 2014 04/09/2009  . BPH (benign prostatic hyperplasia) 09/25/2008  . Gastroparesis 06/19/2008  . Low back pain 12/15/2007  . Posttraumatic stress disorder 11/03/2007  . 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 Allergic rhinitis 03/01/2007  . Essential hypertension 01/17/2007  . History of stroke 01/17/2007    Past Surgical History:  Procedure Laterality Date  . ANAL FISSURECTOMY    . AV FISTULA PLACEMENT    . CARDIAC CATHETERIZATION     2005 DR BRODIE  . HEMORRHOID SURGERY    . kindey  transplant  05/2017  . PROSTATE BIOPSY    . revision of fistula     renal failure  . VENTRAL HERNIA REPAIR  07/01/2011   Procedure: HERNIA REPAIR VENTRAL ADULT;  Surgeon: Joyice Faster. Cornett, MD;  Location: [facility] OR;  Service: General;  Laterality: N/A;       Family History  Problem Relation Age of Onset  . Heart disease Father   . Renal Disease Father   . Dementia Mother   . Heart attack Mother   . Diabetes Sister   . Heart disease Sister   . Diabetes Maternal Aunt   . Diabetes Maternal Uncle   . Diabetes Paternal Aunt   . Diabetes Paternal Uncle   . Heart disease Other   . Heart attack Brother   . Lung cancer Sister   . Renal Disease Brother   . Ovarian cancer Cousin   . Lung cancer Cousin   . Breast cancer Neg Hx   . Colon cancer Neg Hx   . Pancreatic cancer Neg Hx   . Prostate cancer Neg Hx     Social History   Tobacco Use  . Smoking status: Never Smoker  . Smokeless tobacco: Never Used  Vaping Use  . Vaping Use: Never used  Substance Use Topics  . Alcohol use: No  . Drug use: No    Home Medications Prior to Admission medications   Medication Sig Start Date End Date Taking? Authorizing Provider  acetaminophen (TYLENOL) 500 MG tablet Take 1,000 mg by mouth as needed for mild pain or headache.     [provider]  allopurinol (ZYLOPRIM) 100 MG tablet Take 200 mg by mouth daily.    [provider]  AMBULATORY NON FORMULARY MEDICATION Ankle Foot Orthosis  Foot Drop M21.372 Disp 1 Patient not taking: No sig reported 03/04/20   Gregor Hams, MD  AMBULATORY NON FORMULARY MEDICATION Rolling Walker.  Foot drop M21.372 Patient not taking: Reported on 07/18/2020 03/04/20   Gregor Hams, MD  aspirin 81 MG tablet Take 81 mg by mouth at bedtime.    [provider]  atorvastatin (LIPITOR) 40 MG tablet Take 40 mg by mouth daily.    [provider]  clopidogrel (PLAVIX) 75 MG tablet Take 75 mg by mouth daily.    [provider]   diclofenac Sodium (VOLTAREN) 1 % GEL Apply 2 g topically 4 (four) times daily. 12/21/19   Geradine Girt, DO  furosemide (LASIX) 40 MG tablet Take 1 tablet (40 mg total) by mouth daily as needed for fluid. 12/22/19   Geradine Girt, DO  gabapentin (NEURONTIN) 300 MG capsule TAKE 2 CAPSULES IN MORNING, 1 CAPSULE IN AFTERNOON, AND 2 CAPSULES AT NIGHT. 07/23/20   Tomi Likens, Adam R, DO  insulin aspart protamine- aspart (NOVOLOG MIX 70/30) (70-30) 100 UNIT/ML injection Inject 75 Units into the skin 2 (two) times daily with a  meal.     [provider]  Insulin Syringe-Needle U-100 (B-D INS SYR ULTRAFINE 1CC/31G) 31G X 5/16" 1 ML MISC Used to give insulin injections twice daily. 01/10/20   [name], MD  lidocaine (LIDODERM) 5 % Place 1 patch onto the skin daily. Remove & Discard patch within 12 hours or as directed by MD 12/22/19   Geradine Girt, DO  Magnesium 200 MG TABS Take 200 mg by mouth every evening.     [provider]  mycophenolate (MYFORTIC) 180 MG EC tablet Take 180-360 mg by mouth See admin instructions. Take 2 tablets (360mg ) by mouth in the morning and 1 tablet (180mg ) by mouth at night.    [provider]  nitroGLYCERIN (NITROSTAT) 0.4 MG SL tablet PLACE 1 TABLET (0.4 MG TOTAL) UNDER THE TONGUE EVERY 5 (FIVE) MINUTES AS NEEDED FOR CHEST PAIN. 01/25/18   Skeet Latch, MD  nortriptyline (PAMELOR) 50 MG capsule TAKE 2 CAPSULES (100 MG TOTAL) BY MOUTH AT BEDTIME. 01/01/20   Jaffe, Adam R, DO  omeprazole (PRILOSEC) 20 MG capsule TAKE 1 CAPSULE (20 MG TOTAL) BY MOUTH 2 (TWO) TIMES DAILY BEFORE A MEAL. 07/04/20   [name], MD  Semaglutide,0.25 or 0.5MG /DOS, (OZEMPIC, 0.25 OR 0.5 MG/DOSE,) 2 MG/1.5ML SOPN Inject 1 mg into the skin once a week.     [provider]  tacrolimus (PROGRAF) 5 MG capsule Take 5 mg by mouth 2 (two) times daily.  11/26/17   [provider]  Vitamin D, Ergocalciferol, (DRISDOL) 1.25 MG (50000 UNIT) CAPS capsule Take 50,000  Units by mouth 2 (two) times a week. 04/26/19   [provider]    Allergies    Aspirin and Codeine  Review of Systems   Review of Systems  Constitutional: Negative for chills and fever.  HENT: Negative for ear pain and sore throat.   Eyes: Positive for visual disturbance. Negative for pain.  Respiratory: Negative for cough and shortness of breath.   Cardiovascular: Negative for chest pain and palpitations.  Gastrointestinal: Negative for abdominal pain and vomiting.  Genitourinary: Negative for dysuria and hematuria.  Musculoskeletal: Negative for arthralgias and back pain.  Skin: Negative for color change and rash.  Neurological: Positive for headaches. Negative for seizures and syncope.  Psychiatric/Behavioral: Positive for confusion.  All other systems reviewed and are negative.   Physical Exam Updated Vital Signs BP 136/88   Pulse 95   Temp 98 F (36.7 C) (Oral)   Resp 20   Ht 5' 10.5" (1.791 m)   Wt 102.5 kg   SpO2 100%   BMI 31.97 kg/m   Physical Exam Vitals and nursing note reviewed.  Constitutional:      General: He is not in acute distress.    Appearance: He is well-developed. He is not ill-appearing, toxic-appearing or diaphoretic.  HENT:     Head: Normocephalic and atraumatic.  Eyes:     Conjunctiva/sclera: Conjunctivae normal.  Cardiovascular:     Rate and Rhythm: Normal rate and regular rhythm.     Heart sounds: No murmur heard.   Pulmonary:     Effort: Pulmonary effort is normal. No respiratory distress.     Breath sounds: Normal breath sounds.  Abdominal:     Palpations: Abdomen is soft.     Tenderness: There is no abdominal tenderness.  Musculoskeletal:        General: Normal range of motion.     Cervical back: Neck supple.  Skin:    General: Skin  is warm and dry.  Neurological:     General: No focal deficit present.     Mental Status: He is alert and oriented to person, place, and time.     Cranial Nerves: No cranial nerve  deficit.     Sensory: No sensory deficit.     Motor: No weakness.     Comments: Mental Status:  Alert, thought content appropriate, able to give a coherent history. Speech fluent without evidence of aphasia. Able to follow 2 step commands without difficulty.  Cranial Nerves:  II: Peripheral visual fields grossly normal, pupils equal, round, reactive to light III,IV, VI: ptosis not present, extra-ocular motions intact bilaterally  V,VII: smile symmetric, facial light touch sensation equal VIII: hearing grossly normal to voice  X: uvula elevates symmetrically  XI: bilateral shoulder shrug symmetric and strong XII: midline tongue extension without fassiculations Motor:  Normal tone. 5/5 strength of BUE and BLE major muscle groups including strong and equal grip strength and dorsiflexion/plantar flexion Sensory: light touch normal in all extremities. Cerebellar: normal finger-to-nose with bilateral upper extremities, Romberg sign absent Gait: not accessed  Psychiatric:        Mood and Affect: Mood normal.        Behavior: Behavior normal.     ED Results / Procedures / Treatments   Labs (all labs ordered are listed, but only abnormal results are displayed) Labs Reviewed  COMPREHENSIVE METABOLIC PANEL - Abnormal; Notable for the following components:      Result Value   Glucose, Bld 238 (*)    Creatinine, Ser 1.44 (*)    Total Protein 6.0 (*)    Albumin 3.1 (*)    GFR, Estimated 52 (*)    All other components within normal limits  CBC - Abnormal; Notable for the following components:   WBC 3.1 (*)    All other components within normal limits  URINALYSIS, ROUTINE W REFLEX MICROSCOPIC - Abnormal; Notable for the following components:   APPearance HAZY (*)    Glucose, UA 50 (*)    All other components within normal limits  CBG MONITORING, ED - Abnormal; Notable for the following components:   Glucose-Capillary 215 (*)    All other components within normal limits    EKG EKG  Interpretation  Date/Time:  Tuesday August 13 2020 12:41:16 EDT Ventricular Rate:  92 PR Interval:    QRS Duration: 109 QT Interval:  353 QTC Calculation: 437 R Axis:   -43 Text Interpretation: Sinus rhythm Prolonged PR interval LAD, consider LAFB or inferior infarct Low voltage, precordial leads Anteroseptal infarct, old Abnormal T, consider ischemia, lateral leads Limb lead reversal corrected ? incomplete LBBB Confirmed by Fredia Sorrow [phone]) on 08/13/2020 12:45:47 PM   Radiology CT Head Wo Contrast  Result Date: 08/13/2020 CLINICAL DATA:  Fall, head injury, neck injury EXAM: CT HEAD WITHOUT CONTRAST CT CERVICAL SPINE WITHOUT CONTRAST TECHNIQUE: Multidetector CT imaging of the head and cervical spine was performed following the standard protocol without intravenous contrast. Multiplanar CT image reconstructions of the cervical spine were also generated. COMPARISON:  None. FINDINGS: CT HEAD FINDINGS Brain: Normal anatomic configuration. Parenchymal volume loss is commensurate with the patient's age. Mild periventricular white matter changes are present likely reflecting the sequela of small vessel ischemia. No abnormal intra or extra-axial mass lesion or fluid collection. No abnormal mass effect or midline shift. No evidence of acute intracranial hemorrhage or infarct. Ventricular size is normal. Cerebellum unremarkable. Vascular: No asymmetric hyperdense vasculature at the skull base. Skull: Intact  Sinuses/Orbits: There is moderate mucosal thickening within the a maxillary sinuses bilaterally with layering hyperdense material noted bilaterally in keeping with chronic or fungal sinusitis. No air-fluid levels. Remaining paranasal sinuses are clear. Orbits are unremarkable. Other: Mastoid air cells and middle ear cavities are clear. CT CERVICAL SPINE FINDINGS Alignment: Normal cervical lordosis.  No listhesis. Skull base and vertebrae: The craniocervical junction is unremarkable. The atlantodental  interval is normal. No acute fracture of the cervical spine. Soft tissues and spinal canal: No prevertebral fluid or swelling. No visible canal hematoma. Disc levels: There is intervertebral disc space narrowing and endplate remodeling at L2-7 in keeping with changes of moderate degenerative disc disease. Remaining intervertebral disc heights are preserved. Vertebral body heights are preserved. Review of the axial images demonstrates mild bilateral neuroforaminal narrowing at C5-6 secondary to uncovertebral and mild right facet arthropathy. The spinal canal is widely patent. Upper chest: Unremarkable Other: None significant IMPRESSION: No acute intracranial injury.  No calvarial fracture. Chronic or fungal bilateral maxillary sinusitis. No acute fracture or listhesis of the cervical spine. Degenerative disc and degenerative joint disease at C5-6 resulting in mild bilateral neuroforaminal narrowing. Electronically Signed   By: Fidela Salisbury MD   On: 08/13/2020 14:00   CT Cervical Spine Wo Contrast  Result Date: 08/13/2020 CLINICAL DATA:  Fall, head injury, neck injury EXAM: CT HEAD WITHOUT CONTRAST CT CERVICAL SPINE WITHOUT CONTRAST TECHNIQUE: Multidetector CT imaging of the head and cervical spine was performed following the standard protocol without intravenous contrast. Multiplanar CT image reconstructions of the cervical spine were also generated. COMPARISON:  None. FINDINGS: CT HEAD FINDINGS Brain: Normal anatomic configuration. Parenchymal volume loss is commensurate with the patient's age. Mild periventricular white matter changes are present likely reflecting the sequela of small vessel ischemia. No abnormal intra or extra-axial mass lesion or fluid collection. No abnormal mass effect or midline shift. No evidence of acute intracranial hemorrhage or infarct. Ventricular size is normal. Cerebellum unremarkable. Vascular: No asymmetric hyperdense vasculature at the skull base. Skull: Intact Sinuses/Orbits:  There is moderate mucosal thickening within the a maxillary sinuses bilaterally with layering hyperdense material noted bilaterally in keeping with chronic or fungal sinusitis. No air-fluid levels. Remaining paranasal sinuses are clear. Orbits are unremarkable. Other: Mastoid air cells and middle ear cavities are clear. CT CERVICAL SPINE FINDINGS Alignment: Normal cervical lordosis.  No listhesis. Skull base and vertebrae: The craniocervical junction is unremarkable. The atlantodental interval is normal. No acute fracture of the cervical spine. Soft tissues and spinal canal: No prevertebral fluid or swelling. No visible canal hematoma. Disc levels: There is intervertebral disc space narrowing and endplate remodeling at N1-7 in keeping with changes of moderate degenerative disc disease. Remaining intervertebral disc heights are preserved. Vertebral body heights are preserved. Review of the axial images demonstrates mild bilateral neuroforaminal narrowing at C5-6 secondary to uncovertebral and mild right facet arthropathy. The spinal canal is widely patent. Upper chest: Unremarkable Other: None significant IMPRESSION: No acute intracranial injury.  No calvarial fracture. Chronic or fungal bilateral maxillary sinusitis. No acute fracture or listhesis of the cervical spine. Degenerative disc and degenerative joint disease at C5-6 resulting in mild bilateral neuroforaminal narrowing. Electronically Signed   By: Fidela Salisbury MD   On: 08/13/2020 14:00    Procedures Procedures   Medications Ordered in ED Medications - No data to display  ED Course  I have reviewed the triage vital signs and the nursing notes.  Pertinent labs & imaging results that were available during my care  of the patient were reviewed by me and considered in my medical decision making (see chart for details).    MDM Rules/Calculators/A&P                          70 year old who presented 1 day after a fall.  On arrival, he is alert,  oriented, nontoxic appearing, in no acute distress, alert and oriented x3, questions appropriately.  Neurologic exam overall reassuring.  Vitals reassuring, afebrile, no evidence of tachycardia, hypotension, hypoxia.  Physical exam with no evidence of skull fractures, no bleeds, no wounds.  He does have mild midline cervical tenderness.  However he has full range of motion and strength in his upper lower extremities bilaterally.  Abdomen soft and nontender.  No flank tenderness  Lab work here overall reassuring, CMP without any significant electrode abnormalities, glucose of 238, creatinine 1.44 which appears to be at baseline.  CBC without leukocytosis.  UA without evidence of UTI.  CT of the head and cervical spine without any acute abnormalities.  He does have mild degenerative cervical spine changes.  EKG normal sinus rhythm, no evidence of arrhythmia.  Patient denies any chest pain, dizziness, shortness of breath at the time of the fall.  Suspect mechanical fall.  Patient has remained alert and oriented x3 throughout the ED course.  Suspect symptoms are secondary to mild concussion symptoms.  I discussed the reassuring findings with the patient's daughter.  She did state that his primary care would like for him to be admitted, however I did explain that he does not meet admission criteria at this time.  He has pending OT, PT and arranging a wheelchair for the patient.  We discussed return precautions.  Patient and daughter voiced understanding and are agreeable.  Stable for discharge.  This was a shared visit with my supervising physician Dr. Rogene Houston who independently saw and evaluated the patient & provided guidance in evaluation/management/disposition ,in agreement with care  Final Clinical Impression(s) / ED Diagnoses Final diagnoses:  Fall, initial encounter  Concussion without loss of consciousness, initial encounter    Rx / DC Orders ED Discharge Orders    None        Lyndel Safe 08/13/20 1607    Fredia Sorrow, MD 08/17/20 [phone]

## 2020-08-14 ENCOUNTER — Ambulatory Visit: Payer: Medicare Other | Admitting: Podiatry

## 2020-08-14 ENCOUNTER — Telehealth: Payer: Self-pay

## 2020-08-14 NOTE — Telephone Encounter (Signed)
See below

## 2020-08-14 NOTE — Telephone Encounter (Signed)
Betsy from [facility] would like to know if we can continue  OT and PT due to patient being diagnosed with mild concussion from his fall, She would just like to get the ok before continuing  Please advise   [phone]

## 2020-08-14 NOTE — Telephone Encounter (Signed)
Called and spoke with [facility] and gave the ok.

## 2020-08-14 NOTE — Telephone Encounter (Signed)
Yes thanks-he should take it easy if has worsening concussive symptoms with physical therapy though

## 2020-08-15 ENCOUNTER — Telehealth: Payer: Self-pay

## 2020-08-15 DIAGNOSIS — D849 Immunodeficiency, unspecified: Secondary | ICD-10-CM | POA: Diagnosis not present

## 2020-08-15 DIAGNOSIS — E1159 Type 2 diabetes mellitus with other circulatory complications: Secondary | ICD-10-CM | POA: Diagnosis not present

## 2020-08-15 DIAGNOSIS — E559 Vitamin D deficiency, unspecified: Secondary | ICD-10-CM | POA: Diagnosis not present

## 2020-08-15 DIAGNOSIS — I70209 Unspecified atherosclerosis of native arteries of extremities, unspecified extremity: Secondary | ICD-10-CM | POA: Diagnosis not present

## 2020-08-15 DIAGNOSIS — Z794 Long term (current) use of insulin: Secondary | ICD-10-CM | POA: Diagnosis not present

## 2020-08-15 DIAGNOSIS — R5383 Other fatigue: Secondary | ICD-10-CM | POA: Diagnosis not present

## 2020-08-15 DIAGNOSIS — Z7901 Long term (current) use of anticoagulants: Secondary | ICD-10-CM | POA: Diagnosis not present

## 2020-08-15 DIAGNOSIS — I10 Essential (primary) hypertension: Secondary | ICD-10-CM | POA: Diagnosis not present

## 2020-08-15 DIAGNOSIS — Z905 Acquired absence of kidney: Secondary | ICD-10-CM | POA: Diagnosis not present

## 2020-08-15 NOTE — Telephone Encounter (Signed)
Occupational Therapist is requesting verbal orders to extend occupational therapy for  1 time a week for 2 weeks  [phone] Will- [facility]. Ok to leave voicemail

## 2020-08-16 NOTE — Telephone Encounter (Signed)
Called and gave VO to Will.

## 2020-08-21 ENCOUNTER — Other Ambulatory Visit: Payer: Self-pay

## 2020-08-21 ENCOUNTER — Ambulatory Visit (INDEPENDENT_AMBULATORY_CARE_PROVIDER_SITE_OTHER): Payer: No Typology Code available for payment source | Admitting: Physician Assistant

## 2020-08-21 ENCOUNTER — Encounter: Payer: Self-pay | Admitting: Cardiology

## 2020-08-21 ENCOUNTER — Encounter: Payer: Self-pay | Admitting: Physician Assistant

## 2020-08-21 VITALS — BP 165/93 | HR 96 | Ht 70.0 in | Wt 223.8 lb

## 2020-08-21 DIAGNOSIS — Z940 Kidney transplant status: Secondary | ICD-10-CM

## 2020-08-21 DIAGNOSIS — Z8673 Personal history of transient ischemic attack (TIA), and cerebral infarction without residual deficits: Secondary | ICD-10-CM

## 2020-08-21 DIAGNOSIS — R0600 Dyspnea, unspecified: Secondary | ICD-10-CM

## 2020-08-21 DIAGNOSIS — R0609 Other forms of dyspnea: Secondary | ICD-10-CM

## 2020-08-21 DIAGNOSIS — Z794 Long term (current) use of insulin: Secondary | ICD-10-CM | POA: Diagnosis not present

## 2020-08-21 DIAGNOSIS — E1142 Type 2 diabetes mellitus with diabetic polyneuropathy: Secondary | ICD-10-CM

## 2020-08-21 DIAGNOSIS — I712 Thoracic aortic aneurysm, without rupture, unspecified: Secondary | ICD-10-CM

## 2020-08-21 DIAGNOSIS — I2510 Atherosclerotic heart disease of native coronary artery without angina pectoris: Secondary | ICD-10-CM

## 2020-08-21 DIAGNOSIS — R5383 Other fatigue: Secondary | ICD-10-CM | POA: Diagnosis not present

## 2020-08-21 NOTE — Patient Instructions (Addendum)
Medication Instructions:  Your physician recommends that you continue on your current medications as directed. Please refer to the Current Medication list given to you today.  *If you need a refill on your cardiac medications before your next appointment, please call your pharmacy*  Lab Work: NONE ordered at this time of appointment   If you have labs (blood work) drawn today and your tests are completely normal, you will receive your results only by: [name] MyChart Message (if you have MyChart) OR . A paper copy in the mail If you have any lab test that is abnormal or we need to change your treatment, we will call you to review the results.  Testing/Procedures: Your physician has requested that you have a lexiscan myoview. For further information please visit [email or web address]. Please follow instruction sheet, as given. This test is performed at [address], [place], [place] [address]   Please schedule for 2-4 weeks    Follow-Up: At [facility], you and your health needs are our priority.  As part of our continuing mission to provide you with exceptional heart care, we have created designated Provider Care Teams.  These Care Teams include your primary Cardiologist (physician) and Advanced Practice Providers (APPs -  Physician Assistants and Nurse Practitioners) who all work together to provide you with the care you need, when you need it.  Your next appointment:   2 month(s)  The format for your next appointment:   In Person  Provider:   Kirk Ruths, MD  Other Instructions

## 2020-08-21 NOTE — Progress Notes (Signed)
Cardiology Office Note:    Date:  08/22/2020   ID:  DIA JEFFERYS, DOB 01-12-1951, MRN [id number]  PCP:  [name], MD   [place]  Cardiologist:  Kirk Ruths, MD  Advanced Practice Provider:  No care team member to display Electrophysiologist:  None   Referring MD: [name], MD   Chief Complaint  Patient presents with  . Follow-up    Seen for Dr. Stanford Breed    History of Present Illness:    ABDOULIE TIERCE is a 70 y.o. male with a hx of CVA, DM 2, CKD with renal transplant in 2016, left upper extremity AV fistula, PAD, thoracic aortic aneurysm, and CAD with DES to OM in 2014. He was admitted in June 2019 with chest pain.  Myoview was abnormal, however patient prefers medical therapy versus cath.  Ischemia was in the inferolateral wall, however patient has a known 95% stenosis in the small nondominant RCA from previous cardiac cath in 2014 that was treated medically.  ABI obtained in 2019 was abnormal and the patient was subsequently referred to Dr. Donnetta Hutching for evaluation.  According to vascular surgery note, his symptom was not consistent with claudication, therefore no further work-up was recommended.  No lower extremity angiography was recommended which may increase the risk to his transplanted kidney with minimal benefit.  He had chest pain in July 2021.  Echocardiogram during the admission showed EF greater than 75%, severe LVH, grade 1 DD.  Most recent CT of the chest obtained in December 2021 showed stable ascending thoracic aortic aneurysm measuring 4.8 cm, 6 mm bilateral pulmonary nodule with recommendation to repeat CT in 3 to 6 months. He has not had any further work-up since.    He was most recently seen by Kerin Ransom, PA-C on 06/20/2020 for preoperative clearance prior to lumbar spine injection.  Patient presents today for cardiology office visit.  He requires a rolling walker to get around.  He is looking into getting a wheelchair.   Peripheral neuropathy is getting worse and and now he is having frequent falls.  In the past 47-month, he has had 11 falls.  He gets short of breath with minimal activity and become fatigued very easily.  He denies any chest pain, however with dyspnea on minimal exertion, I am concerned of anginal equivalent.  Previous echocardiogram in July 2021 was stable, I recommended a Myoview to rule out any ischemic changes.  He is aware that he likely will have the inferior lateral ischemia, however as long as he has no large area of ischemia, I think medical therapy is still warranted.   Past Medical History:  Diagnosis Date  . Allergy   . Angina   . Arthritis   . Backache, unspecified   . CHF (congestive heart failure) ([place])   . Chills   . Chronic kidney disease, stage III (moderate) (HCC)    HD T- TH-SAT  . Complication of anesthesia    01/2011 could not eat,, hospt x2, was placed on hd and cleared up  . Coronary atherosclerosis of native coronary artery   . Diabetes mellitus 2004  . Dizziness   . Dysphagia, unspecified(787.20)   . Fall at home 11/2019  . Gastroparesis   . Headache(784.0)   . Hemorrhage of rectum and anus   . Hyperlipidemia   . Hypertrophy of prostate with urinary obstruction and other lower urinary tract symptoms (LUTS)   . INTERNAL HEMORRHOIDS 10/16/2008   Qualifier: Diagnosis of  By: Nolon Rod CMA (AAMA), Robin    . Internal hemorrhoids without mention of complication   . Myocardial infarction ([name]) 2002  . Nausea alone   . Obesity   . Other dyspnea and respiratory abnormality   . Other malaise and fatigue   . Personal history of unspecified circulatory disease   . Posttraumatic stress disorder   . Prostate cancer ([place])   . Sleep apnea    USES CPAP   . Stroke [facility])    2007  . Unspecified essential hypertension    hx htn     Past Surgical History:  Procedure Laterality Date  . ANAL FISSURECTOMY    . AV FISTULA PLACEMENT    . CARDIAC CATHETERIZATION     2005  DR BRODIE  . HEMORRHOID SURGERY    . 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 PROSTATE BIOPSY    . revision of fistula     renal failure  . VENTRAL HERNIA REPAIR  07/01/2011   Procedure: HERNIA REPAIR VENTRAL ADULT;  Surgeon: Joyice Faster. Cornett, MD;  Location: [facility] OR;  Service: General;  Laterality: N/A;    Current Medications: Current Meds  Medication Sig  . acetaminophen (TYLENOL) 500 MG tablet Take 1,000 mg by mouth as needed for mild pain or headache.   . allopurinol (ZYLOPRIM) 100 MG tablet Take 200 mg by mouth daily.  . AMBULATORY NON FORMULARY MEDICATION Ankle Foot Orthosis  Foot Drop M21.372 Disp 1  . AMBULATORY NON FORMULARY MEDICATION Rolling Walker.  Foot drop M21.372  . aspirin 81 MG tablet Take 81 mg by mouth at bedtime.  [name] atorvastatin (LIPITOR) 40 MG tablet Take 40 mg by mouth daily.  . clopidogrel (PLAVIX) 75 MG tablet Take 75 mg by mouth daily.  . diclofenac Sodium (VOLTAREN) 1 % GEL Apply 2 g topically 4 (four) times daily.  . furosemide (LASIX) 40 MG tablet Take 1 tablet (40 mg total) by mouth daily as needed for fluid.  [name] gabapentin (NEURONTIN) 300 MG capsule TAKE 2 CAPSULES IN MORNING, 1 CAPSULE IN AFTERNOON, AND 2 CAPSULES AT NIGHT.  [name] insulin aspart protamine- aspart (NOVOLOG MIX 70/30) (70-30) 100 UNIT/ML injection Inject 75 Units into the skin 2 (two) times daily with a meal.   . Insulin Syringe-Needle U-100 (B-D INS SYR ULTRAFINE 1CC/31G) 31G X 5/16" 1 ML MISC Used to give insulin injections twice daily.  [name] lidocaine (LIDODERM) 5 % Place 1 patch onto the skin daily. Remove & Discard patch within 12 hours or as directed by MD  . Magnesium 200 MG TABS Take 200 mg by mouth every evening.   . mycophenolate (MYFORTIC) 180 MG EC tablet Take 180-360 mg by mouth See admin instructions. Take 2 tablets (360mg ) by mouth in the morning and 1 tablet (180mg ) by mouth at night.  . nitroGLYCERIN (NITROSTAT) 0.4 MG SL tablet PLACE 1 TABLET (0.4 MG TOTAL) UNDER THE TONGUE EVERY 5 (FIVE)  MINUTES AS NEEDED FOR CHEST PAIN.  [name] nortriptyline (PAMELOR) 50 MG capsule TAKE 2 CAPSULES (100 MG TOTAL) BY MOUTH AT BEDTIME.  [name] omeprazole (PRILOSEC) 20 MG capsule TAKE 1 CAPSULE (20 MG TOTAL) BY MOUTH 2 (TWO) TIMES DAILY BEFORE A MEAL.  . Semaglutide,0.25 or 0.5MG /DOS, (OZEMPIC, 0.25 OR 0.5 MG/DOSE,) 2 MG/1.5ML SOPN Inject 1 mg into the skin once a week.   . tacrolimus (PROGRAF) 5 MG capsule Take 5 mg by mouth 2 (two) times daily.   . Vitamin D, Ergocalciferol, (DRISDOL) 1.25 MG (50000 UNIT) CAPS capsule Take 50,000 Units by  mouth 2 (two) times a week.     Allergies:   Aspirin and Codeine   Social History   Socioeconomic History  . Marital status: Divorced    Spouse name: Manufacturing engineer  . Number of children: 3  . Years of education: Not on file  . Highest education level: Associate degree: academic program  Occupational History  . Occupation: disabled    Employer: DISABLED  Tobacco Use  . Smoking status: Never Smoker  . Smokeless tobacco: Never Used  Vaping Use  . Vaping Use: Never used  Substance and Sexual Activity  . Alcohol use: No  . Drug use: No  . Sexual activity: Not Currently  Other Topics Concern  . Not on file  Social History Narrative   Married 6654. 64 year old son in 53. 1 granddaughter from [place].       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 He lives with his wife. He drinks 3-4 cups of coffee a day. He walks most every day.   Social Determinants of Health   Financial Resource Strain: Low Risk   . Difficulty of Paying Living Expenses: Not hard at all  Food Insecurity: No Food Insecurity  . Worried About Charity fundraiser in the Last Year: Never true  . Ran Out of Food in the Last Year: Never true  Transportation Needs: No Transportation Needs  . Lack of Transportation (Medical): No  . Lack of  Transportation (Non-Medical): No  Physical Activity: Sufficiently Active  . Days of Exercise per Week: 3 days  . Minutes of Exercise per Session: 60 min  Stress: No Stress Concern Present  . Feeling of Stress : Not at all  Social Connections: Moderately Isolated  . Frequency of Communication with Friends and Family: More than three times a week  . Frequency of Social Gatherings with Friends and Family: Once a week  . Attends Religious Services: More than 4 times per year  . Active Member of Clubs or Organizations: No  . Attends Archivist Meetings: Never  . Marital Status: Divorced     Family History: The patient's family history includes Dementia in his mother; Diabetes in his maternal aunt, maternal uncle, paternal aunt, paternal uncle, and sister; Heart attack in his brother and mother; Heart disease in his father, sister, and another family member; Lung cancer in his cousin and sister; Ovarian cancer in his cousin; Renal Disease in his brother and father. There is no history of Breast cancer, Colon cancer, Pancreatic cancer, or Prostate cancer.  ROS:   Please see the history of present illness.     All other systems reviewed and are negative.  EKGs/Labs/Other Studies Reviewed:    The following studies were reviewed today:  Echo 12/19/2019 1. There is no significant left ventricular outflow tract gradient. Left  ventricular ejection fraction, by estimation, is >75%. The left ventricle  has hyperdynamic function. The left ventricle has no regional wall motion  abnormalities. There is severe  concentric left ventricular hypertrophy. Left ventricular diastolic  parameters are consistent with Grade I diastolic dysfunction (impaired  relaxation).  2. Right ventricular systolic function is normal. The right ventricular  size is normal. There is normal pulmonary artery systolic pressure.  3. There is a calcified nodule on anterior leaflet basilar surface. The  mitral  valve is degenerative.  No evidence of mitral valve regurgitation.  No evidence of mitral stenosis.  4. The aortic valve is tricuspid. Aortic valve regurgitation is trivial.  Mild to moderate aortic valve sclerosis/calcification is present, without  any evidence of aortic stenosis.  5. Aortic dilatation noted. 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  6. The inferior vena cava is normal in size with greater than 50%  respiratory variability, suggesting right atrial pressure of 3 mmHg.   EKG:  EKG is not ordered today.    Recent Labs: 07/08/2020: TSH 2.60 08/13/2020: ALT 11; BUN 15; Creatinine, Ser 1.44; Hemoglobin 14.1; Platelets 195; Potassium 4.3; Sodium 136  Recent Lipid Panel    Component Value Date/Time   CHOL 148 01/02/2020 1344   TRIG 191 (H) 01/02/2020 1344   TRIG 373 (HH) 04/27/2006 1123   HDL 47 01/02/2020 1344   CHOLHDL 3.1 01/02/2020 1344   VLDL 44.6 (H) 07/29/2011 1322   LDLCALC 72 01/02/2020 1344   LDLDIRECT 75.0 01/16/2019 1154     Risk Assessment/Calculations:       Physical Exam:    VS:  BP (!) 165/93   Pulse 96   Ht 5\' 10"  (1.778 m)   Wt 223 lb 12.8 oz (101.5 kg)   SpO2 99%   BMI 32.11 kg/m     Wt Readings from Last 3 Encounters:  08/21/20 223 lb 12.8 oz (101.5 kg)  08/13/20 226 lb (102.5 kg)  07/18/20 230 lb 3.2 oz (104.4 kg)     GEN: Frail HEENT: Normal NECK: No JVD; No carotid bruits LYMPHATICS: No lymphadenopathy CARDIAC: RRR, no murmurs, rubs, gallops RESPIRATORY:  Clear to auscultation without rales, wheezing or rhonchi  ABDOMEN: Soft, non-tender, non-distended MUSCULOSKELETAL:  No edema; No deformity  SKIN: Warm and dry NEUROLOGIC:  Alert and oriented x 3 PSYCHIATRIC:  Normal affect   ASSESSMENT:    1. DOE (dyspnea on exertion)   2. Fatigue, unspecified type   3. Controlled type 2 diabetes mellitus with diabetic polyneuropathy, with long-term current use of insulin ([place])   4. Renal transplant  recipient   5. Coronary artery disease involving native coronary artery without angina pectoris, unspecified whether native or transplanted heart   6. H/O: CVA (cerebrovascular accident)   7. Thoracic aortic aneurysm without rupture ([place])    PLAN:    In order of problems listed above:  1. Dyspnea on exertion: We will obtain Myoview to rule out anginal equivalent.  Previous Myoview showed ischemia in the inferolateral area, as long as he does not have large area of ischemia, I would not recommend any invasive work-up.  2. Fatigue: I suspect he has significant deconditioning as well due to neuropathy.  3. DM2 with peripheral neuropathy: He has significant neuropathy that is interfering with his ability to walk.  He is planning to obtain a wheelchair  4. CKD s/p renal transplant: Stable renal function  5. CAD: Denies any recent chest pain.  6. History of CVA: No recent recurrence  7. Thoracic aortic aneurysm: Last echocardiogram in July 2021 showed dilated thoracic aorta measuring 48 mm.   Shared Decision Making/Informed Consent The risks [chest pain, shortness of breath, cardiac arrhythmias, dizziness, blood pressure fluctuations, myocardial infarction, stroke/transient ischemic attack, nausea, vomiting, allergic reaction, radiation exposure, metallic taste sensation and life-threatening complications (estimated to be 1 in 10,000)], benefits (risk stratification, diagnosing coronary artery disease, treatment guidance) and alternatives of a nuclear stress test were discussed in detail with Mr. Herendeen and he agrees to  proceed.       Medication Adjustments/Labs and Tests Ordered: Current medicines are reviewed at length with the patient today.  Concerns regarding medicines are outlined above.  Orders Placed This Encounter  Procedures  . Cardiac Stress Test: Informed Consent Details: Physician/Practitioner Attestation; Transcribe to consent form and obtain patient signature  .  MYOCARDIAL PERFUSION IMAGING   No orders of the defined types were placed in this encounter.   Patient Instructions  Medication Instructions:  Your physician recommends that you continue on your current medications as directed. Please refer to the Current Medication list given to you today.  *If you need a refill on your cardiac medications before your next appointment, please call your pharmacy*  Lab Work: NONE ordered at this time of appointment   If you have labs (blood work) drawn today and your tests are completely normal, you will receive your results only by: [name] MyChart Message (if you have MyChart) OR . A paper copy in the mail If you have any lab test that is abnormal or we need to change your treatment, we will call you to review the results.  Testing/Procedures: Your physician has requested that you have a lexiscan myoview. For further information please visit [email or web address]. Please follow instruction sheet, as given. This test is performed at [address], [place], [place] [address]   Please schedule for 2-4 weeks    Follow-Up: At [facility], you and your health needs are our priority.  As part of our continuing mission to provide you with exceptional heart care, we have created designated Provider Care Teams.  These Care Teams include your primary Cardiologist (physician) and Advanced Practice Providers (APPs -  Physician Assistants and Nurse Practitioners) who all work together to provide you with the care you need, when you need it.  Your next appointment:   2 month(s)  The format for your next appointment:   In Person  Provider:   Kirk Ruths, MD  Other Instructions      Signed, Almyra Deforest, [place]  08/22/2020 9:20 PM    [place]

## 2020-08-22 ENCOUNTER — Encounter: Payer: Self-pay | Admitting: Physician Assistant

## 2020-08-22 ENCOUNTER — Telehealth: Payer: Self-pay

## 2020-08-22 DIAGNOSIS — D849 Immunodeficiency, unspecified: Secondary | ICD-10-CM | POA: Diagnosis not present

## 2020-08-22 DIAGNOSIS — I70209 Unspecified atherosclerosis of native arteries of extremities, unspecified extremity: Secondary | ICD-10-CM | POA: Diagnosis not present

## 2020-08-22 DIAGNOSIS — Z7901 Long term (current) use of anticoagulants: Secondary | ICD-10-CM | POA: Diagnosis not present

## 2020-08-22 DIAGNOSIS — I10 Essential (primary) hypertension: Secondary | ICD-10-CM | POA: Diagnosis not present

## 2020-08-22 DIAGNOSIS — R5383 Other fatigue: Secondary | ICD-10-CM | POA: Diagnosis not present

## 2020-08-22 DIAGNOSIS — E1159 Type 2 diabetes mellitus with other circulatory complications: Secondary | ICD-10-CM | POA: Diagnosis not present

## 2020-08-22 DIAGNOSIS — E559 Vitamin D deficiency, unspecified: Secondary | ICD-10-CM | POA: Diagnosis not present

## 2020-08-22 DIAGNOSIS — Z794 Long term (current) use of insulin: Secondary | ICD-10-CM | POA: Diagnosis not present

## 2020-08-22 DIAGNOSIS — Z905 Acquired absence of kidney: Secondary | ICD-10-CM | POA: Diagnosis not present

## 2020-08-22 NOTE — Telephone Encounter (Signed)
Error

## 2020-08-23 NOTE — Patient Instructions (Incomplete)
Health Maintenance Due  Topic Date Due  . COLONOSCOPY (Pts 45-29yrs Insurance coverage will need to be confirmed)  Never done  . OPHTHALMOLOGY EXAM  06/12/2020  . HEMOGLOBIN A1C  07/04/2020   Depression screen [facility] 2/9 07/08/2020 01/02/2020 06/26/2019  Decreased Interest 0 0 0  Down, Depressed, Hopeless 0 0 0  PHQ - 2 Score 0 0 0  Some recent data might be hidden

## 2020-08-23 NOTE — Progress Notes (Deleted)
Phone [phone] In person visit   Subjective:   [name] is a 70 y.o. year old very pleasant male patient who presents for/with See problem oriented charting No chief complaint on file.   This visit occurred during the SARS-CoV-2 public health emergency.  Safety protocols were in place, including screening questions prior to the visit, additional usage of staff PPE, and extensive cleaning of exam room while observing appropriate contact time as indicated for disinfecting solutions.   Past Medical History-  Patient Active Problem List   Diagnosis Date Noted  . Pre-operative clearance 06/20/2020  . Protrusion of lumbar intervertebral disc   . Malignant neoplasm of prostate ([place]) 06/09/2019  . PAD (peripheral artery disease) ([place]) 01/16/2019  . Spinal stenosis of lumbar region 09/21/2018  . Morbid obesity ([place]) 12/04/2017  . Mild cognitive impairment 06/18/2016  . Hyperlipidemia 01/16/2016  . Immunosuppressive management encounter following kidney transplant 07/12/2015  . Renal transplant recipient 06/27/2015  . Immunosuppression ([place]) 06/27/2015  . Thoracic aortic aneurysm ([place]) 10/23/2014  . GERD (gastroesophageal reflux disease) 05/16/2014  . Trigger thumb of right hand 01/03/2014  . Left foot drop 05/02/2013  . ED (erectile dysfunction) 04/03/2013  . H/O iron deficiency anemia 02/10/2012  . Risk for falls 01/22/2012  . Diabetic neuropathy ([place]) 10/14/2010  . Gout 12/22/2009  . Obstructive sleep apnea 07/10/2009  . CAD -s/p DES to marginal vessel at [facility] 2014 04/09/2009  . BPH (benign prostatic hyperplasia) 09/25/2008  . Gastroparesis 06/19/2008  . Low back pain 12/15/2007  . Posttraumatic stress disorder 11/03/2007  . Diabetes mellitus due to underlying condition with diabetic autonomic (poly)neuropathy ([place]) 09/05/2007  . Allergic rhinitis 03/01/2007  . Essential hypertension 01/17/2007  . History of stroke 01/17/2007    Medications- reviewed and  updated Current Outpatient Medications  Medication Sig Dispense Refill  . acetaminophen (TYLENOL) 500 MG tablet Take 1,000 mg by mouth as needed for mild pain or headache.     . allopurinol (ZYLOPRIM) 100 MG tablet Take 200 mg by mouth daily.    . AMBULATORY NON FORMULARY MEDICATION Ankle Foot Orthosis  Foot Drop M21.372 Disp 1 1 each 0  . AMBULATORY NON FORMULARY MEDICATION Rolling Walker.  Foot drop M21.372 1 each 0  . aspirin 81 MG tablet Take 81 mg by mouth at bedtime.    [name] atorvastatin (LIPITOR) 40 MG tablet Take 40 mg by mouth daily.    . clopidogrel (PLAVIX) 75 MG tablet Take 75 mg by mouth daily.    . diclofenac Sodium (VOLTAREN) 1 % GEL Apply 2 g topically 4 (four) times daily. 50 g 0  . furosemide (LASIX) 40 MG tablet Take 1 tablet (40 mg total) by mouth daily as needed for fluid. 30 tablet   . gabapentin (NEURONTIN) 300 MG capsule TAKE 2 CAPSULES IN MORNING, 1 CAPSULE IN AFTERNOON, AND 2 CAPSULES AT NIGHT. 150 capsule 0  . insulin aspart protamine- aspart (NOVOLOG MIX 70/30) (70-30) 100 UNIT/ML injection Inject 75 Units into the skin 2 (two) times daily with a meal.     . Insulin Syringe-Needle U-100 (B-D INS SYR ULTRAFINE 1CC/31G) 31G X 5/16" 1 ML MISC Used to give insulin injections twice daily. 100 each 11  . lidocaine (LIDODERM) 5 % Place 1 patch onto the skin daily. Remove & Discard patch within 12 hours or as directed by MD 30 patch 0  . Magnesium 200 MG TABS Take 200 mg by mouth every evening.     . mycophenolate (MYFORTIC) 180 MG EC tablet  Take 180-360 mg by mouth See admin instructions. Take 2 tablets (360mg ) by mouth in the morning and 1 tablet (180mg ) by mouth at night.    . nitroGLYCERIN (NITROSTAT) 0.4 MG SL tablet PLACE 1 TABLET (0.4 MG TOTAL) UNDER THE TONGUE EVERY 5 (FIVE) MINUTES AS NEEDED FOR CHEST PAIN. 25 tablet 2  . nortriptyline (PAMELOR) 50 MG capsule TAKE 2 CAPSULES (100 MG TOTAL) BY MOUTH AT BEDTIME. 180 capsule 2  . omeprazole (PRILOSEC) 20 MG capsule TAKE  1 CAPSULE (20 MG TOTAL) BY MOUTH 2 (TWO) TIMES DAILY BEFORE A MEAL. 180 capsule 3  . Semaglutide,0.25 or 0.5MG /DOS, (OZEMPIC, 0.25 OR 0.5 MG/DOSE,) 2 MG/1.5ML SOPN Inject 1 mg into the skin once a week.     . tacrolimus (PROGRAF) 5 MG capsule Take 5 mg by mouth 2 (two) times daily.     . Vitamin D, Ergocalciferol, (DRISDOL) 1.25 MG (50000 UNIT) CAPS capsule Take 50,000 Units by mouth 2 (two) times a week.     No current facility-administered medications for this visit.     Objective:  There were no vitals taken for this visit. Gen: NAD, resting comfortably CV: RRR no murmurs rubs or gallops Lungs: CTAB no crackles, wheeze, rhonchi Abdomen: soft/nontender/nondistended/normal bowel sounds. No rebound or guarding.  Ext: no edema Skin: warm, dry Neuro: grossly normal, moves all extremities  ***    Assessment and Plan  Pain S:***  A/P: ***     *** 03/22/2019 mri abdomen- ordered repeat 07/08/20 1. 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 2. Hemorrhagic/proteinaceous 0.9 cm renal cortical lesion in the right lateral portion of the right lower quadrant renal transplant, incompletely characterized on this noncontrast MRI study. 3. Follow-up noncontrast MRI     No problem-specific Assessment & Plan notes found for this encounter.   Recommended follow up: ***No follow-ups on file. Future Appointments  Date Time Provider [place]  08/27/2020  2:20 PM [name], MD [facility]  09/03/2020 10:45 AM [facility] NM2/TREAD [id number] [facility]  09/05/2020  9:50 AM Pieter Partridge, DO [facility] None  10/29/2020 11:40 AM Lelon Perla, MD [facility]  07/28/2021  2:30 PM [facility] HEALTH COACH [facility]     Lab/Order associations: No diagnosis found.  No orders of the defined types were placed in this encounter.   Time Spent: *** minutes of total time (11:28 AM***- 11:28 AM***) was spent on the date of the encounter performing the following actions: chart review prior to seeing the patient, obtaining history, performing a medically necessary exam, counseling on the treatment plan, placing orders, and documenting in our EHR.   Return precautions advised.  Clyde Lundborg, CMA

## 2020-08-27 ENCOUNTER — Ambulatory Visit: Payer: Medicare Other | Admitting: Family Medicine

## 2020-08-27 DIAGNOSIS — Z0289 Encounter for other administrative examinations: Secondary | ICD-10-CM

## 2020-08-28 ENCOUNTER — Telehealth: Payer: Self-pay

## 2020-08-28 ENCOUNTER — Telehealth: Payer: Self-pay | Admitting: Neurology

## 2020-08-28 DIAGNOSIS — M542 Cervicalgia: Secondary | ICD-10-CM | POA: Diagnosis not present

## 2020-08-28 DIAGNOSIS — Z905 Acquired absence of kidney: Secondary | ICD-10-CM | POA: Diagnosis not present

## 2020-08-28 DIAGNOSIS — R2681 Unsteadiness on feet: Secondary | ICD-10-CM | POA: Diagnosis not present

## 2020-08-28 DIAGNOSIS — E1142 Type 2 diabetes mellitus with diabetic polyneuropathy: Secondary | ICD-10-CM | POA: Diagnosis not present

## 2020-08-28 DIAGNOSIS — I10 Essential (primary) hypertension: Secondary | ICD-10-CM | POA: Diagnosis not present

## 2020-08-28 DIAGNOSIS — D849 Immunodeficiency, unspecified: Secondary | ICD-10-CM | POA: Diagnosis not present

## 2020-08-28 DIAGNOSIS — I70209 Unspecified atherosclerosis of native arteries of extremities, unspecified extremity: Secondary | ICD-10-CM | POA: Diagnosis not present

## 2020-08-28 DIAGNOSIS — R296 Repeated falls: Secondary | ICD-10-CM | POA: Diagnosis not present

## 2020-08-28 DIAGNOSIS — E559 Vitamin D deficiency, unspecified: Secondary | ICD-10-CM | POA: Diagnosis not present

## 2020-08-28 NOTE — Telephone Encounter (Signed)
Betsy with [facility] called and wanted to speak to the CMA about a fall the patient had, Gwinda Passe would like a call back asap she is with the patient.
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
## 2020-08-28 NOTE — Telephone Encounter (Signed)
Patient has been scheduled for 08/30/20.

## 2020-08-28 NOTE — Telephone Encounter (Signed)
Pt states after his fall and hitting his head he did go to the hospital and was told that he had a mild concussion.  Since that fall he's had a headache on and off since then. The headaches have subsided now.   Pt just wanted to let Dr.Jaffe know.

## 2020-08-28 NOTE — Telephone Encounter (Signed)
Patient called and requested a call back from a nurse prior to his appointment tomorrow at 3:30 with Dr. Tomi Likens. He said he fell two weeks ago and hit his head.

## 2020-08-29 ENCOUNTER — Ambulatory Visit (INDEPENDENT_AMBULATORY_CARE_PROVIDER_SITE_OTHER): Payer: Medicare Other | Admitting: Neurology

## 2020-08-29 ENCOUNTER — Encounter: Payer: Self-pay | Admitting: Neurology

## 2020-08-29 ENCOUNTER — Telehealth (HOSPITAL_COMMUNITY): Payer: Self-pay

## 2020-08-29 ENCOUNTER — Other Ambulatory Visit: Payer: Self-pay

## 2020-08-29 VITALS — BP 138/87 | HR 98 | Ht 70.0 in | Wt 229.8 lb

## 2020-08-29 DIAGNOSIS — G44319 Acute post-traumatic headache, not intractable: Secondary | ICD-10-CM

## 2020-08-29 DIAGNOSIS — M542 Cervicalgia: Secondary | ICD-10-CM | POA: Diagnosis not present

## 2020-08-29 DIAGNOSIS — R296 Repeated falls: Secondary | ICD-10-CM

## 2020-08-29 DIAGNOSIS — E1142 Type 2 diabetes mellitus with diabetic polyneuropathy: Secondary | ICD-10-CM

## 2020-08-29 MED ORDER — TIZANIDINE HCL 2 MG PO TABS: 2.0000 mg | ORAL_TABLET | Freq: Every day | ORAL | 5 refills | Status: DC

## 2020-08-29 NOTE — Telephone Encounter (Signed)
Patient has missed multiple visits recently-he needs to keep his appointment

## 2020-08-29 NOTE — Progress Notes (Signed)
NEUROLOGY FOLLOW UP OFFICE NOTE  [name] [id number]  Assessment/Plan:   1.  Posttraumatic headache/cervicalgia 2.  Frequent falls - significant diabetic polyneuropathy - he reports 7 falls within the past month.  At this time, I don't think it is safe for him to ambulate.    1.  Add tizanidine 2mg  at bedtime 2.  Add physical therapy for neck pain  3.  Continue nortriptyline and gabapentin 4.  Will order a wheelchair 5.  Patient has follow up with his PCP, Dr. Yong Channel, tomorrow. 6.  Follow up 4 to 6 months.  Subjective:  [name] is a66year old right-handed man with type 2 diabetes mellitus complicated by polyneuropathy, renal failure status post transplant 2017, gastroparesis, CHF, CAD, and history of stroke who follows up for recent fall and concussion.  UPDATE: Current medication: Nortriptyline100mg  at bedtime; gabapentin 600mg /300mg /600mg   On 08/12/2020, he was getting dress and was unable to feel his foot hit the bench, causing him to trip and fall hitting his head on the dresser.  He did not lose consciousness.  The next morning, he woke up with intermittent blurred vision and confusion and generalized weakness.  He went to the ED.  CT head and cervical spine personally reviewed showed no acute abnormalities.  EKG and labs overall unremarkable.  He was diagnosed with mild concussion.  For a week, he had a headache daily.  They now occur if he has not been able to sleep.  It lasts 2-3 hours.  It is a mild-moderate left occipital pain. Associated nausea. Takes Tylenol.  Reports right sidedneck and shoulder pain.    HISTORY: He has history lumbar radiculopathy down both hips and legs (worse on the left) since injuring his back in the [employer] many years ago. MRI of lumbar spine from 06/16/13 showed disc bulge and facet arthropathy with moderate biforaminal narrowing at L5-S1. In the past he would get epidural injections which helped for about 6 months. He has  diabetic neuropathy and has numbness and pain in the feet. He also has gout in the big toes. He has problems with balance. He has some residual weakness in the right leg due to stroke. 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 He has associated numbness. In 2020, pain in left leg has started to become worse. Due to worsening lumbosacral radicular pain, gabapentin was increased and he was started on nortriptyline. He saw the orthopedist. MRI of lumbar spine from 10/07/2018 showed slight progression of degenerative disc disease with mild left-predominant multilevel neural foraminal stenosis but no spinal stenosis. He also endorsed neck pain, so MRI of cervical spine was also performed, which demonstrated mild degenerative disc disease predominantly at C5-6 with mild bilateral neural foraminal stenosis but no spinal stenosis.  He has previously endorsed myoclonus and tremor in his hands as well as leg jerking.  weattempted totransitionfrom gabapentin to nortriptyline. We had decreased gabapentin to 600mg  at night but he experienced increased pain so he increased back to 900mg  at night. Nortriptyline was titrated up to 100mg  at bedtime, which has helped with the pain.  Since decreasing gabapentin, he reported increased left sided sciatic pain, requiring use of a walker, so he increased dose.  In July 2021, he was hospitalized for fall after feeling lightheaded while reaching down for clothes in his closet and he fell onto his knees.  He was found to be orthostatic.  Cardiac workup (EKG, troponin, echo) was unremarkable.  MRI of brain showed mild age  relalted cerebral atrhopy and chronic small vessel disease with small remote left cerebellar infarcts but no acute intracranial abnormality.  MRI of lumbar spine personally reviewed showed multifactorial degenerative changes at L5-S1 causing mild to moderate left greater than right lateral recess stenosis with moderate bilateral L5 foraminal  narrowing, stable compared to previous imaging, as well as slightly increased left foraminal disc protrusion at L4-5 potentially irritating the left L4 nerve root.  Typical chest pain thought to be musculoskeletal.  He was discharged with compression stockings.  He reports fatigue and weakness.  He went to PT for low back pain and weakness.  He has known foot drop and has been to Sports Medicine and orthopedics.  He continued to have falls with spinnng and lightheadedness, sometimes seeing black spots, which has caused several falls.  It lasts for a couple of minutes, usually when standing up or changing head position.  He is currently wearing a 30 day cardiac event monitor. He has been undergoing physical therapy where he is receiving vestibular rehab and treatment of weakness and pain involving the back and legs.  Tremors in hands are overall unchanged.  Some days worse than other days.    Past medications: Tramadol 50mg ; Lyrica (aggravated PTSD);  PAST MEDICAL HISTORY: Past Medical History:  Diagnosis Date  . Allergy   . Angina   . Arthritis   . Backache, unspecified   . CHF (congestive heart failure) ([place])   . Chills   . Chronic kidney disease, stage III (moderate) (HCC)    HD T- TH-SAT  . Complication of anesthesia    01/2011 could not eat,, hospt x2, was placed on hd and cleared up  . Coronary atherosclerosis of native coronary artery   . Diabetes mellitus 2004  . Dizziness   . Dysphagia, unspecified(787.20)   . Fall at home 11/2019  . Gastroparesis   . Headache(784.0)   . Hemorrhage of rectum and anus   . Hyperlipidemia   . Hypertrophy of prostate with urinary obstruction and other lower urinary tract symptoms (LUTS)   . INTERNAL HEMORRHOIDS 10/16/2008   Qualifier: Diagnosis of  By: Nolon Rod CMA (AAMA), Robin    . Internal hemorrhoids without mention of complication   . Myocardial infarction ([place]) 2002  . Nausea alone   . Obesity   . Other dyspnea and respiratory abnormality    . Other malaise and fatigue   . Personal history of unspecified circulatory disease   . Posttraumatic stress disorder   . Prostate cancer ([place])   . Sleep apnea    USES CPAP   . Stroke [facility])    2007  . Unspecified essential hypertension    hx htn     MEDICATIONS: Current Outpatient Medications on File Prior to Visit  Medication Sig Dispense Refill  . acetaminophen (TYLENOL) 500 MG tablet Take 1,000 mg by mouth as needed for mild pain or headache.     . allopurinol (ZYLOPRIM) 100 MG tablet Take 200 mg by mouth daily.    . AMBULATORY NON FORMULARY MEDICATION Ankle Foot Orthosis  Foot Drop M21.372 Disp 1 1 each 0  . AMBULATORY NON FORMULARY MEDICATION Rolling Walker.  Foot drop M21.372 1 each 0  . aspirin 81 MG tablet Take 81 mg by mouth at bedtime.    [name] atorvastatin (LIPITOR) 40 MG tablet Take 40 mg by mouth daily.    . clopidogrel (PLAVIX) 75 MG tablet Take 75 mg by mouth daily.    . diclofenac Sodium (VOLTAREN) 1 % GEL  Apply 2 g topically 4 (four) times daily. 50 g 0  . furosemide (LASIX) 40 MG tablet Take 1 tablet (40 mg total) by mouth daily as needed for fluid. 30 tablet   . gabapentin (NEURONTIN) 300 MG capsule TAKE 2 CAPSULES IN MORNING, 1 CAPSULE IN AFTERNOON, AND 2 CAPSULES AT NIGHT. 150 capsule 0  . insulin aspart protamine- aspart (NOVOLOG MIX 70/30) (70-30) 100 UNIT/ML injection Inject 75 Units into the skin 2 (two) times daily with a meal.     . Insulin Syringe-Needle U-100 (B-D INS SYR ULTRAFINE 1CC/31G) 31G X 5/16" 1 ML MISC Used to give insulin injections twice daily. 100 each 11  . lidocaine (LIDODERM) 5 % Place 1 patch onto the skin daily. Remove & Discard patch within 12 hours or as directed by MD 30 patch 0  . Magnesium 200 MG TABS Take 200 mg by mouth every evening.     . mycophenolate (MYFORTIC) 180 MG EC tablet Take 180-360 mg by mouth See admin instructions. Take 2 tablets (360mg ) by mouth in the morning and 1 tablet (180mg ) by mouth at night.    .  nitroGLYCERIN (NITROSTAT) 0.4 MG SL tablet PLACE 1 TABLET (0.4 MG TOTAL) UNDER THE TONGUE EVERY 5 (FIVE) MINUTES AS NEEDED FOR CHEST PAIN. 25 tablet 2  . nortriptyline (PAMELOR) 50 MG capsule TAKE 2 CAPSULES (100 MG TOTAL) BY MOUTH AT BEDTIME. 180 capsule 2  . omeprazole (PRILOSEC) 20 MG capsule TAKE 1 CAPSULE (20 MG TOTAL) BY MOUTH 2 (TWO) TIMES DAILY BEFORE A MEAL. 180 capsule 3  . Semaglutide,0.25 or 0.5MG /DOS, (OZEMPIC, 0.25 OR 0.5 MG/DOSE,) 2 MG/1.5ML SOPN Inject 1 mg into the skin once a week.     . tacrolimus (PROGRAF) 5 MG capsule Take 5 mg by mouth 2 (two) times daily.     . Vitamin D, Ergocalciferol, (DRISDOL) 1.25 MG (50000 UNIT) CAPS capsule Take 50,000 Units by mouth 2 (two) times a week.     No current facility-administered medications on file prior to visit.    ALLERGIES: Allergies  Allergen Reactions  . Aspirin   . Codeine     FAMILY HISTORY: Family History  Problem Relation Age of Onset  . Heart disease Father   . Renal Disease Father   . Dementia Mother   . Heart attack Mother   . Diabetes Sister   . Heart disease Sister   . Diabetes Maternal Aunt   . Diabetes Maternal Uncle   . Diabetes Paternal Aunt   . Diabetes Paternal Uncle   . Heart disease Other   . Heart attack Brother   . Lung cancer Sister   . Renal Disease Brother   . Ovarian cancer Cousin   . Lung cancer Cousin   . Breast cancer Neg Hx   . Colon cancer Neg Hx   . Pancreatic cancer Neg Hx   . Prostate cancer Neg Hx       Objective:  Blood pressure 138/87, pulse 98, height 5\' 10"  (1.778 m), weight 229 lb 12.8 oz (104.2 kg), SpO2 95 %. General: No acute distress.  Patient appears well-groomed.   Head:  Normocephalic/atraumatic Eyes:  Fundi examined but not visualized Neck: supple, left paraspinal tenderness, full range of motion Heart:  Regular rate and rhythm Lungs:  Clear to auscultation bilaterally Back: No paraspinal tenderness Neurological Exam: alert and oriented to person, place,  and time. Speech fluent and not dysarthric, language intact.  CN II-XII intact. Bulk and tone normal, muscle strength 5-/5 throughout except  3/5 left ankle dorsiflexion.  Deep tendon reflexes absent throughout, toes downgoing.  Finger to nose testing with mild postural and kinetic tremor but no dysmetria.  Wide-based antalgic gait.  Ambulates with rolling walker.      Metta Clines, DO  CC: [name]. Yong Channel, MD

## 2020-08-29 NOTE — Telephone Encounter (Signed)
Attempted to contact the patient. There was no answer, I will try again later. S.Adelle Zachar EMTP

## 2020-08-29 NOTE — Patient Instructions (Addendum)
1. Take tizanidine 2mg  at bedtime for neck pain 2.  Physical therapy for neck pain 3.  Will order wheelchair 4.  Continue gabapentin and nortriptyline 5.  Follow up 4 to 6 months

## 2020-08-29 NOTE — Patient Instructions (Addendum)
Health Maintenance Due  Topic Date Due  . COLONOSCOPY (Pts 45-62yrs Insurance coverage will need to be confirmed) Patient states that he had on in 2017 at [facility]- team please get this from [facility]- I believe we have requested multiple times before Never done  . OPHTHALMOLOGY EXAM Needs to schedule. 06/12/2020  . HEMOGLOBIN A1C team please request A1c from [facility] Dr. Garnet Koyanagi 07/04/2020  ' # Renal cysts- MRI ordered February visit but has not bene completed- team please help him set this up  We will call you within two weeks about your referral for home health with a different agency to see if they can help with social work and possible home health aide . If you do not hear within 2 weeks, give [place] a call.
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
## 2020-08-30 ENCOUNTER — Encounter: Payer: Self-pay | Admitting: Family Medicine

## 2020-08-30 ENCOUNTER — Telehealth: Payer: Self-pay

## 2020-08-30 ENCOUNTER — Ambulatory Visit (INDEPENDENT_AMBULATORY_CARE_PROVIDER_SITE_OTHER): Payer: Medicare Other | Admitting: Family Medicine

## 2020-08-30 ENCOUNTER — Other Ambulatory Visit: Payer: Medicare Other

## 2020-08-30 VITALS — BP 134/72 | HR 100 | Temp 98.3°F | Ht 70.0 in | Wt 229.2 lb

## 2020-08-30 DIAGNOSIS — I10 Essential (primary) hypertension: Secondary | ICD-10-CM

## 2020-08-30 DIAGNOSIS — R296 Repeated falls: Secondary | ICD-10-CM | POA: Diagnosis not present

## 2020-08-30 DIAGNOSIS — R531 Weakness: Secondary | ICD-10-CM

## 2020-08-30 DIAGNOSIS — Z1211 Encounter for screening for malignant neoplasm of colon: Secondary | ICD-10-CM

## 2020-08-30 DIAGNOSIS — E118 Type 2 diabetes mellitus with unspecified complications: Secondary | ICD-10-CM | POA: Diagnosis not present

## 2020-08-30 DIAGNOSIS — M542 Cervicalgia: Secondary | ICD-10-CM | POA: Diagnosis not present

## 2020-08-30 DIAGNOSIS — G8191 Hemiplegia, unspecified affecting right dominant side: Secondary | ICD-10-CM | POA: Diagnosis not present

## 2020-08-30 DIAGNOSIS — D849 Immunodeficiency, unspecified: Secondary | ICD-10-CM

## 2020-08-30 DIAGNOSIS — E1142 Type 2 diabetes mellitus with diabetic polyneuropathy: Secondary | ICD-10-CM | POA: Diagnosis not present

## 2020-08-30 NOTE — Telephone Encounter (Signed)
Betsy with [facility] requesting call from [place]

## 2020-08-30 NOTE — Telephone Encounter (Signed)
Can I give the verbal ?

## 2020-08-30 NOTE — Telephone Encounter (Signed)
We are trying to switch companies because we need social work and home health aide so would hold off for now

## 2020-08-30 NOTE — Telephone Encounter (Signed)
[facility] Occupational Therapist is requesting verbal orders for   Authorization to resched OT eval to next week  [phone] . Ok to leave voicemail

## 2020-08-30 NOTE — Addendum Note (Signed)
Addended by: Venetia Night on: 08/30/2020 10:14 AM   Modules accepted: Orders

## 2020-09-02 ENCOUNTER — Telehealth: Payer: Self-pay

## 2020-09-02 NOTE — Telephone Encounter (Signed)
[facility] medical states patient has not been seen inoffice since sep 21 and his A1C was at 7.1%

## 2020-09-02 NOTE — Telephone Encounter (Signed)
Discharge only once he has a visit with new company-I am not sure we will be able to get him the services he needs with home health aide and social work-Lisa any updates?

## 2020-09-02 NOTE — Telephone Encounter (Signed)
[phone]. [name]   [facility] is requesting a call in regards to this and wondering if they should discharge the patient.

## 2020-09-02 NOTE — Telephone Encounter (Signed)
Please advise. Will return her call once I receive more information.

## 2020-09-03 ENCOUNTER — Encounter: Payer: Self-pay | Admitting: Family Medicine

## 2020-09-03 ENCOUNTER — Other Ambulatory Visit: Payer: Self-pay

## 2020-09-03 ENCOUNTER — Ambulatory Visit (HOSPITAL_COMMUNITY): Payer: Medicare Other | Attending: Cardiology

## 2020-09-03 DIAGNOSIS — R0609 Other forms of dyspnea: Secondary | ICD-10-CM

## 2020-09-03 DIAGNOSIS — R5383 Other fatigue: Secondary | ICD-10-CM

## 2020-09-03 DIAGNOSIS — R0600 Dyspnea, unspecified: Secondary | ICD-10-CM | POA: Diagnosis not present

## 2020-09-03 LAB — MYOCARDIAL PERFUSION IMAGING
LV dias vol: 72 mL (ref 62–150)
LV sys vol: 38 mL
Peak HR: 95 {beats}/min
Rest HR: 89 {beats}/min
SDS: 1
SRS: 0
SSS: 1
TID: 1.43

## 2020-09-03 MED ORDER — TECHNETIUM TC 99M TETROFOSMIN IV KIT
29.6000 | PACK | Freq: Once | INTRAVENOUS | Status: AC | PRN
  Administered 2020-09-03: 29.6 via INTRAVENOUS
  Filled 2020-09-03: qty 30

## 2020-09-03 MED ORDER — REGADENOSON 0.4 MG/5ML IV SOLN
0.4000 mg | Freq: Once | INTRAVENOUS | Status: AC
  Administered 2020-09-03: 0.4 mg via INTRAVENOUS

## 2020-09-03 MED ORDER — TECHNETIUM TC 99M TETROFOSMIN IV KIT
9.8000 | PACK | Freq: Once | INTRAVENOUS | Status: AC | PRN
  Administered 2020-09-03: 9.8 via INTRAVENOUS
  Filled 2020-09-03: qty 10

## 2020-09-03 NOTE — Progress Notes (Signed)
The Hemoglobin A1C that we added for september of 2021 is what [facility] gave [place]. They state he hasn't been seen since.

## 2020-09-03 NOTE — Telephone Encounter (Signed)
Yes Im sorry patient is established with [facility] and they are getting him taken care of

## 2020-09-03 NOTE — Telephone Encounter (Signed)
I was asking about the new referral- sorry not the original with [facility]. Patient had been with [facility] but I did not think they offered home health aide or social work that was added on last/new referral. I was asking him to be switched to new agency that could provide both. Or does [facility] now provide both?

## 2020-09-03 NOTE — Telephone Encounter (Signed)
A1C has been abstracted into the chart.

## 2020-09-04 DIAGNOSIS — Z905 Acquired absence of kidney: Secondary | ICD-10-CM | POA: Diagnosis not present

## 2020-09-04 DIAGNOSIS — E1142 Type 2 diabetes mellitus with diabetic polyneuropathy: Secondary | ICD-10-CM | POA: Diagnosis not present

## 2020-09-04 DIAGNOSIS — M542 Cervicalgia: Secondary | ICD-10-CM | POA: Diagnosis not present

## 2020-09-04 DIAGNOSIS — R2681 Unsteadiness on feet: Secondary | ICD-10-CM | POA: Diagnosis not present

## 2020-09-04 DIAGNOSIS — I10 Essential (primary) hypertension: Secondary | ICD-10-CM | POA: Diagnosis not present

## 2020-09-04 DIAGNOSIS — D849 Immunodeficiency, unspecified: Secondary | ICD-10-CM | POA: Diagnosis not present

## 2020-09-04 DIAGNOSIS — I70209 Unspecified atherosclerosis of native arteries of extremities, unspecified extremity: Secondary | ICD-10-CM | POA: Diagnosis not present

## 2020-09-04 DIAGNOSIS — R296 Repeated falls: Secondary | ICD-10-CM | POA: Diagnosis not present

## 2020-09-04 DIAGNOSIS — E559 Vitamin D deficiency, unspecified: Secondary | ICD-10-CM | POA: Diagnosis not present

## 2020-09-04 NOTE — Addendum Note (Signed)
Addended by: [name] on: 09/04/2020 12:17 PM   Modules accepted: Orders

## 2020-09-04 NOTE — Addendum Note (Signed)
Addended by: [name] on: 09/04/2020 12:32 PM   Modules accepted: Orders

## 2020-09-05 ENCOUNTER — Ambulatory Visit: Payer: Medicare Other | Admitting: Neurology

## 2020-09-06 DIAGNOSIS — I10 Essential (primary) hypertension: Secondary | ICD-10-CM | POA: Diagnosis not present

## 2020-09-06 DIAGNOSIS — R2681 Unsteadiness on feet: Secondary | ICD-10-CM | POA: Diagnosis not present

## 2020-09-06 DIAGNOSIS — Z905 Acquired absence of kidney: Secondary | ICD-10-CM | POA: Diagnosis not present

## 2020-09-06 DIAGNOSIS — E559 Vitamin D deficiency, unspecified: Secondary | ICD-10-CM | POA: Diagnosis not present

## 2020-09-06 DIAGNOSIS — R296 Repeated falls: Secondary | ICD-10-CM | POA: Diagnosis not present

## 2020-09-06 DIAGNOSIS — D849 Immunodeficiency, unspecified: Secondary | ICD-10-CM | POA: Diagnosis not present

## 2020-09-06 DIAGNOSIS — E1142 Type 2 diabetes mellitus with diabetic polyneuropathy: Secondary | ICD-10-CM | POA: Diagnosis not present

## 2020-09-06 DIAGNOSIS — I70209 Unspecified atherosclerosis of native arteries of extremities, unspecified extremity: Secondary | ICD-10-CM | POA: Diagnosis not present

## 2020-09-06 DIAGNOSIS — M542 Cervicalgia: Secondary | ICD-10-CM | POA: Diagnosis not present

## 2020-09-11 ENCOUNTER — Telehealth: Payer: Self-pay

## 2020-09-11 ENCOUNTER — Inpatient Hospital Stay: Admission: RE | Admit: 2020-09-11 | Payer: Medicare Other | Source: Ambulatory Visit

## 2020-09-11 DIAGNOSIS — R2681 Unsteadiness on feet: Secondary | ICD-10-CM | POA: Diagnosis not present

## 2020-09-11 DIAGNOSIS — E1142 Type 2 diabetes mellitus with diabetic polyneuropathy: Secondary | ICD-10-CM | POA: Diagnosis not present

## 2020-09-11 DIAGNOSIS — D849 Immunodeficiency, unspecified: Secondary | ICD-10-CM | POA: Diagnosis not present

## 2020-09-11 DIAGNOSIS — R296 Repeated falls: Secondary | ICD-10-CM | POA: Diagnosis not present

## 2020-09-11 DIAGNOSIS — I70209 Unspecified atherosclerosis of native arteries of extremities, unspecified extremity: Secondary | ICD-10-CM | POA: Diagnosis not present

## 2020-09-11 DIAGNOSIS — I10 Essential (primary) hypertension: Secondary | ICD-10-CM | POA: Diagnosis not present

## 2020-09-11 DIAGNOSIS — M542 Cervicalgia: Secondary | ICD-10-CM | POA: Diagnosis not present

## 2020-09-11 DIAGNOSIS — Z905 Acquired absence of kidney: Secondary | ICD-10-CM | POA: Diagnosis not present

## 2020-09-11 DIAGNOSIS — E559 Vitamin D deficiency, unspecified: Secondary | ICD-10-CM | POA: Diagnosis not present

## 2020-09-11 NOTE — Telephone Encounter (Signed)
almira from [facility]    .Home Health verbal orders-caller/Agency: [place] number: [phone]  Requesting OT/PT/Skilled nursing/Social Work/Speech: PT   Reason:  Frequency: 2w X2  1w X2

## 2020-09-11 NOTE — Telephone Encounter (Signed)
Called and spoke with Amira and VO provided.

## 2020-09-13 ENCOUNTER — Other Ambulatory Visit: Payer: Self-pay

## 2020-09-13 DIAGNOSIS — Z5989 Other problems related to housing and economic circumstances: Secondary | ICD-10-CM

## 2020-09-13 DIAGNOSIS — R296 Repeated falls: Secondary | ICD-10-CM

## 2020-09-14 ENCOUNTER — Other Ambulatory Visit: Payer: Medicare Other

## 2020-09-16 ENCOUNTER — Other Ambulatory Visit: Payer: Self-pay

## 2020-09-16 ENCOUNTER — Encounter: Payer: Self-pay | Admitting: Family Medicine

## 2020-09-16 ENCOUNTER — Ambulatory Visit (INDEPENDENT_AMBULATORY_CARE_PROVIDER_SITE_OTHER): Payer: Medicare Other | Admitting: Family Medicine

## 2020-09-16 VITALS — BP 128/66 | HR 85 | Temp 98.7°F | Ht 70.0 in | Wt 238.4 lb

## 2020-09-16 DIAGNOSIS — E785 Hyperlipidemia, unspecified: Secondary | ICD-10-CM | POA: Diagnosis not present

## 2020-09-16 DIAGNOSIS — E0843 Diabetes mellitus due to underlying condition with diabetic autonomic (poly)neuropathy: Secondary | ICD-10-CM

## 2020-09-16 DIAGNOSIS — I2510 Atherosclerotic heart disease of native coronary artery without angina pectoris: Secondary | ICD-10-CM | POA: Diagnosis not present

## 2020-09-16 DIAGNOSIS — Z794 Long term (current) use of insulin: Secondary | ICD-10-CM

## 2020-09-16 DIAGNOSIS — I10 Essential (primary) hypertension: Secondary | ICD-10-CM | POA: Diagnosis not present

## 2020-09-16 DIAGNOSIS — E1143 Type 2 diabetes mellitus with diabetic autonomic (poly)neuropathy: Secondary | ICD-10-CM

## 2020-09-16 DIAGNOSIS — I208 Other forms of angina pectoris: Secondary | ICD-10-CM

## 2020-09-16 DIAGNOSIS — E1159 Type 2 diabetes mellitus with other circulatory complications: Secondary | ICD-10-CM

## 2020-09-16 DIAGNOSIS — C61 Malignant neoplasm of prostate: Secondary | ICD-10-CM

## 2020-09-16 NOTE — Patient Instructions (Addendum)
Health Maintenance Due  Topic Date Due  . COLONOSCOPY - has cologuard on hand now and will complete  Never done  . OPHTHALMOLOGY EXAM will schedule.  06/12/2020   Call [facility] for follow up  Please stop by lab before you go If you have mychart- we will send your results within 3 business days of [place] receiving them.  If you do not have mychart- we will call you about results within 5 business days of [place] receiving them.  *please also note that you will see labs on mychart as soon as they post. I will later go in and write notes on them- will say "notes from Dr. Yong Channel"
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
## 2020-09-17 LAB — CBC WITH DIFFERENTIAL/PLATELET
Basophils Absolute: 0 10*3/uL (ref 0.0–0.1)
Basophils Relative: 0.8 % (ref 0.0–3.0)
Eosinophils Absolute: 0.1 10*3/uL (ref 0.0–0.7)
Eosinophils Relative: 2.3 % (ref 0.0–5.0)
HCT: 39.9 % (ref 39.0–52.0)
Hemoglobin: 12.8 g/dL — ABNORMAL LOW (ref 13.0–17.0)
Lymphocytes Relative: 11 % — ABNORMAL LOW (ref 12.0–46.0)
Lymphs Abs: 0.4 10*3/uL — ABNORMAL LOW (ref 0.7–4.0)
MCHC: 32.1 g/dL (ref 30.0–36.0)
MCV: 85.8 fl (ref 78.0–100.0)
Monocytes Absolute: 0.3 10*3/uL (ref 0.1–1.0)
Monocytes Relative: 10.1 % (ref 3.0–12.0)
Neutro Abs: 2.6 10*3/uL (ref 1.4–7.7)
Neutrophils Relative %: 75.8 % (ref 43.0–77.0)
Platelets: 236 10*3/uL (ref 150.0–400.0)
RBC: 4.65 Mil/uL (ref 4.22–5.81)
RDW: 16.6 % — ABNORMAL HIGH (ref 11.5–15.5)
WBC: 3.5 10*3/uL — ABNORMAL LOW (ref 4.0–10.5)

## 2020-09-17 LAB — COMPREHENSIVE METABOLIC PANEL
ALT: 7 U/L (ref 0–53)
AST: 14 U/L (ref 0–37)
Albumin: 3.3 g/dL — ABNORMAL LOW (ref 3.5–5.2)
Alkaline Phosphatase: 85 U/L (ref 39–117)
BUN: 16 mg/dL (ref 6–23)
CO2: 31 mEq/L (ref 19–32)
Calcium: 9.4 mg/dL (ref 8.4–10.5)
Chloride: 104 mEq/L (ref 96–112)
Creatinine, Ser: 1.4 mg/dL (ref 0.40–1.50)
GFR: 50.46 mL/min — ABNORMAL LOW (ref >60.00)
Glucose, Bld: 196 mg/dL — ABNORMAL HIGH (ref 70–99)
Potassium: 4.6 mEq/L (ref 3.5–5.1)
Sodium: 141 mEq/L (ref 135–145)
Total Bilirubin: 0.5 mg/dL (ref 0.2–1.2)
Total Protein: 6 g/dL (ref 6.0–8.3)

## 2020-09-17 LAB — HEMOGLOBIN A1C: Hgb A1c MFr Bld: 9.9 % — ABNORMAL HIGH (ref 4.6–6.5)

## 2020-09-17 LAB — LDL CHOLESTEROL, DIRECT: Direct LDL: 60 mg/dL

## 2020-09-17 LAB — PSA: PSA: 0.34 ng/mL (ref 0.10–4.00)

## 2020-09-18 DIAGNOSIS — R296 Repeated falls: Secondary | ICD-10-CM | POA: Diagnosis not present

## 2020-09-18 DIAGNOSIS — E559 Vitamin D deficiency, unspecified: Secondary | ICD-10-CM | POA: Diagnosis not present

## 2020-09-18 DIAGNOSIS — Z905 Acquired absence of kidney: Secondary | ICD-10-CM | POA: Diagnosis not present

## 2020-09-18 DIAGNOSIS — I10 Essential (primary) hypertension: Secondary | ICD-10-CM | POA: Diagnosis not present

## 2020-09-18 DIAGNOSIS — E1142 Type 2 diabetes mellitus with diabetic polyneuropathy: Secondary | ICD-10-CM | POA: Diagnosis not present

## 2020-09-18 DIAGNOSIS — I70209 Unspecified atherosclerosis of native arteries of extremities, unspecified extremity: Secondary | ICD-10-CM | POA: Diagnosis not present

## 2020-09-18 DIAGNOSIS — M542 Cervicalgia: Secondary | ICD-10-CM | POA: Diagnosis not present

## 2020-09-18 DIAGNOSIS — D849 Immunodeficiency, unspecified: Secondary | ICD-10-CM | POA: Diagnosis not present

## 2020-09-18 DIAGNOSIS — R2681 Unsteadiness on feet: Secondary | ICD-10-CM | POA: Diagnosis not present

## 2020-09-19 DIAGNOSIS — E559 Vitamin D deficiency, unspecified: Secondary | ICD-10-CM | POA: Diagnosis not present

## 2020-09-19 DIAGNOSIS — R2681 Unsteadiness on feet: Secondary | ICD-10-CM | POA: Diagnosis not present

## 2020-09-19 DIAGNOSIS — I10 Essential (primary) hypertension: Secondary | ICD-10-CM | POA: Diagnosis not present

## 2020-09-19 DIAGNOSIS — Z905 Acquired absence of kidney: Secondary | ICD-10-CM | POA: Diagnosis not present

## 2020-09-19 DIAGNOSIS — D849 Immunodeficiency, unspecified: Secondary | ICD-10-CM | POA: Diagnosis not present

## 2020-09-19 DIAGNOSIS — M542 Cervicalgia: Secondary | ICD-10-CM | POA: Diagnosis not present

## 2020-09-19 DIAGNOSIS — R296 Repeated falls: Secondary | ICD-10-CM | POA: Diagnosis not present

## 2020-09-19 DIAGNOSIS — I70209 Unspecified atherosclerosis of native arteries of extremities, unspecified extremity: Secondary | ICD-10-CM | POA: Diagnosis not present

## 2020-09-19 DIAGNOSIS — E1142 Type 2 diabetes mellitus with diabetic polyneuropathy: Secondary | ICD-10-CM | POA: Diagnosis not present

## 2020-09-26 DIAGNOSIS — I739 Peripheral vascular disease, unspecified: Secondary | ICD-10-CM | POA: Diagnosis not present

## 2020-09-26 DIAGNOSIS — Z940 Kidney transplant status: Secondary | ICD-10-CM | POA: Diagnosis not present

## 2020-09-26 DIAGNOSIS — E1165 Type 2 diabetes mellitus with hyperglycemia: Secondary | ICD-10-CM | POA: Diagnosis not present

## 2020-09-26 DIAGNOSIS — I6930 Unspecified sequelae of cerebral infarction: Secondary | ICD-10-CM | POA: Diagnosis not present

## 2020-09-26 DIAGNOSIS — E785 Hyperlipidemia, unspecified: Secondary | ICD-10-CM | POA: Diagnosis not present

## 2020-09-26 DIAGNOSIS — Z794 Long term (current) use of insulin: Secondary | ICD-10-CM | POA: Diagnosis not present

## 2020-09-26 DIAGNOSIS — I2510 Atherosclerotic heart disease of native coronary artery without angina pectoris: Secondary | ICD-10-CM | POA: Diagnosis not present

## 2020-09-27 DIAGNOSIS — I2510 Atherosclerotic heart disease of native coronary artery without angina pectoris: Secondary | ICD-10-CM | POA: Diagnosis not present

## 2020-09-27 DIAGNOSIS — I129 Hypertensive chronic kidney disease with stage 1 through stage 4 chronic kidney disease, or unspecified chronic kidney disease: Secondary | ICD-10-CM | POA: Diagnosis not present

## 2020-09-27 DIAGNOSIS — Z940 Kidney transplant status: Secondary | ICD-10-CM | POA: Diagnosis not present

## 2020-09-27 DIAGNOSIS — E1129 Type 2 diabetes mellitus with other diabetic kidney complication: Secondary | ICD-10-CM | POA: Diagnosis not present

## 2020-09-27 DIAGNOSIS — E559 Vitamin D deficiency, unspecified: Secondary | ICD-10-CM | POA: Diagnosis not present

## 2020-09-27 DIAGNOSIS — E785 Hyperlipidemia, unspecified: Secondary | ICD-10-CM | POA: Diagnosis not present

## 2020-09-27 DIAGNOSIS — M109 Gout, unspecified: Secondary | ICD-10-CM | POA: Diagnosis not present

## 2020-09-27 DIAGNOSIS — R768 Other specified abnormal immunological findings in serum: Secondary | ICD-10-CM | POA: Diagnosis not present

## 2020-09-27 DIAGNOSIS — G629 Polyneuropathy, unspecified: Secondary | ICD-10-CM | POA: Diagnosis not present

## 2020-09-27 DIAGNOSIS — N2581 Secondary hyperparathyroidism of renal origin: Secondary | ICD-10-CM | POA: Diagnosis not present

## 2020-09-29 ENCOUNTER — Other Ambulatory Visit: Payer: Medicare Other

## 2020-09-29 DIAGNOSIS — E118 Type 2 diabetes mellitus with unspecified complications: Secondary | ICD-10-CM | POA: Diagnosis not present

## 2020-10-08 ENCOUNTER — Observation Stay (HOSPITAL_COMMUNITY)
Admission: EM | Admit: 2020-10-08 | Discharge: 2020-10-10 | Disposition: A | Discharge status: Home / Self Care | Payer: Medicare Other | Attending: Internal Medicine | Admitting: Internal Medicine

## 2020-10-08 ENCOUNTER — Other Ambulatory Visit: Payer: Self-pay

## 2020-10-08 ENCOUNTER — Encounter (HOSPITAL_COMMUNITY): Payer: Self-pay | Admitting: Student

## 2020-10-08 ENCOUNTER — Telehealth: Payer: Self-pay

## 2020-10-08 ENCOUNTER — Emergency Department (HOSPITAL_COMMUNITY): Payer: Medicare Other

## 2020-10-08 DIAGNOSIS — N1831 Chronic kidney disease, stage 3a: Secondary | ICD-10-CM | POA: Insufficient documentation

## 2020-10-08 DIAGNOSIS — J320 Chronic maxillary sinusitis: Secondary | ICD-10-CM | POA: Diagnosis not present

## 2020-10-08 DIAGNOSIS — M19012 Primary osteoarthritis, left shoulder: Secondary | ICD-10-CM | POA: Diagnosis not present

## 2020-10-08 DIAGNOSIS — R296 Repeated falls: Secondary | ICD-10-CM

## 2020-10-08 DIAGNOSIS — W19XXXA Unspecified fall, initial encounter: Secondary | ICD-10-CM | POA: Diagnosis not present

## 2020-10-08 DIAGNOSIS — I10 Essential (primary) hypertension: Secondary | ICD-10-CM | POA: Diagnosis not present

## 2020-10-08 DIAGNOSIS — Z794 Long term (current) use of insulin: Secondary | ICD-10-CM | POA: Diagnosis not present

## 2020-10-08 DIAGNOSIS — R0902 Hypoxemia: Secondary | ICD-10-CM | POA: Diagnosis not present

## 2020-10-08 DIAGNOSIS — N189 Chronic kidney disease, unspecified: Secondary | ICD-10-CM | POA: Diagnosis not present

## 2020-10-08 DIAGNOSIS — Z043 Encounter for examination and observation following other accident: Secondary | ICD-10-CM | POA: Diagnosis not present

## 2020-10-08 DIAGNOSIS — J3489 Other specified disorders of nose and nasal sinuses: Secondary | ICD-10-CM | POA: Diagnosis not present

## 2020-10-08 DIAGNOSIS — E559 Vitamin D deficiency, unspecified: Secondary | ICD-10-CM | POA: Diagnosis not present

## 2020-10-08 DIAGNOSIS — I2510 Atherosclerotic heart disease of native coronary artery without angina pectoris: Secondary | ICD-10-CM | POA: Diagnosis not present

## 2020-10-08 DIAGNOSIS — G319 Degenerative disease of nervous system, unspecified: Secondary | ICD-10-CM | POA: Diagnosis not present

## 2020-10-08 DIAGNOSIS — I130 Hypertensive heart and chronic kidney disease with heart failure and stage 1 through stage 4 chronic kidney disease, or unspecified chronic kidney disease: Secondary | ICD-10-CM | POA: Diagnosis not present

## 2020-10-08 DIAGNOSIS — E1142 Type 2 diabetes mellitus with diabetic polyneuropathy: Secondary | ICD-10-CM | POA: Diagnosis not present

## 2020-10-08 DIAGNOSIS — Z20822 Contact with and (suspected) exposure to covid-19: Secondary | ICD-10-CM | POA: Insufficient documentation

## 2020-10-08 DIAGNOSIS — Z7982 Long term (current) use of aspirin: Secondary | ICD-10-CM | POA: Insufficient documentation

## 2020-10-08 DIAGNOSIS — M542 Cervicalgia: Secondary | ICD-10-CM | POA: Diagnosis not present

## 2020-10-08 DIAGNOSIS — I509 Heart failure, unspecified: Secondary | ICD-10-CM | POA: Insufficient documentation

## 2020-10-08 DIAGNOSIS — R2681 Unsteadiness on feet: Secondary | ICD-10-CM | POA: Diagnosis not present

## 2020-10-08 DIAGNOSIS — G4733 Obstructive sleep apnea (adult) (pediatric): Secondary | ICD-10-CM | POA: Diagnosis present

## 2020-10-08 DIAGNOSIS — E1122 Type 2 diabetes mellitus with diabetic chronic kidney disease: Secondary | ICD-10-CM | POA: Diagnosis not present

## 2020-10-08 DIAGNOSIS — N179 Acute kidney failure, unspecified: Secondary | ICD-10-CM | POA: Diagnosis not present

## 2020-10-08 DIAGNOSIS — Z940 Kidney transplant status: Secondary | ICD-10-CM

## 2020-10-08 DIAGNOSIS — Z79899 Other long term (current) drug therapy: Secondary | ICD-10-CM | POA: Diagnosis not present

## 2020-10-08 DIAGNOSIS — G9389 Other specified disorders of brain: Secondary | ICD-10-CM | POA: Diagnosis not present

## 2020-10-08 DIAGNOSIS — E11649 Type 2 diabetes mellitus with hypoglycemia without coma: Secondary | ICD-10-CM | POA: Diagnosis not present

## 2020-10-08 DIAGNOSIS — Z905 Acquired absence of kidney: Secondary | ICD-10-CM | POA: Diagnosis not present

## 2020-10-08 DIAGNOSIS — I70209 Unspecified atherosclerosis of native arteries of extremities, unspecified extremity: Secondary | ICD-10-CM | POA: Diagnosis not present

## 2020-10-08 DIAGNOSIS — E0843 Diabetes mellitus due to underlying condition with diabetic autonomic (poly)neuropathy: Secondary | ICD-10-CM | POA: Diagnosis present

## 2020-10-08 DIAGNOSIS — D849 Immunodeficiency, unspecified: Secondary | ICD-10-CM | POA: Diagnosis not present

## 2020-10-08 DIAGNOSIS — R42 Dizziness and giddiness: Secondary | ICD-10-CM | POA: Diagnosis not present

## 2020-10-08 LAB — COMPREHENSIVE METABOLIC PANEL
ALT: 11 U/L (ref 0–44)
AST: 17 U/L (ref 15–41)
Albumin: 4.1 g/dL (ref 3.5–5.0)
Alkaline Phosphatase: 84 U/L (ref 38–126)
Anion gap: 11 (ref 5–15)
BUN: 25 mg/dL — ABNORMAL HIGH (ref 8–23)
CO2: 33 mmol/L — ABNORMAL HIGH (ref 22–32)
Calcium: 9.9 mg/dL (ref 8.9–10.3)
Chloride: 96 mmol/L — ABNORMAL LOW (ref 98–111)
Creatinine, Ser: 2.08 mg/dL — ABNORMAL HIGH (ref 0.61–1.24)
GFR, Estimated: 33 mL/min — ABNORMAL LOW (ref >60)
Glucose, Bld: 148 mg/dL — ABNORMAL HIGH (ref 70–99)
Potassium: 4.2 mmol/L (ref 3.5–5.1)
Sodium: 140 mmol/L (ref 135–145)
Total Bilirubin: 1.2 mg/dL (ref 0.3–1.2)
Total Protein: 7.9 g/dL (ref 6.5–8.1)

## 2020-10-08 LAB — CBC WITH DIFFERENTIAL/PLATELET
Abs Immature Granulocytes: 0.02 10*3/uL (ref 0.00–0.07)
Basophils Absolute: 0 10*3/uL (ref 0.0–0.1)
Basophils Relative: 0 %
Eosinophils Absolute: 0.1 10*3/uL (ref 0.0–0.5)
Eosinophils Relative: 2 %
HCT: 50.2 % (ref 39.0–52.0)
Hemoglobin: 15.1 g/dL (ref 13.0–17.0)
Immature Granulocytes: 1 %
Lymphocytes Relative: 13 %
Lymphs Abs: 0.5 10*3/uL — ABNORMAL LOW (ref 0.7–4.0)
MCH: 27.3 pg (ref 26.0–34.0)
MCHC: 30.1 g/dL (ref 30.0–36.0)
MCV: 90.8 fL (ref 80.0–100.0)
Monocytes Absolute: 0.4 10*3/uL (ref 0.1–1.0)
Monocytes Relative: 10 %
Neutro Abs: 2.7 10*3/uL (ref 1.7–7.7)
Neutrophils Relative %: 74 %
Platelets: 252 10*3/uL (ref 150–400)
RBC: 5.53 MIL/uL (ref 4.22–5.81)
RDW: 15.1 % (ref 11.5–15.5)
WBC: 3.7 10*3/uL — ABNORMAL LOW (ref 4.0–10.5)
nRBC: 0 % (ref 0.0–0.2)

## 2020-10-08 LAB — URINALYSIS, ROUTINE W REFLEX MICROSCOPIC
Bilirubin Urine: NEGATIVE
Glucose, UA: NEGATIVE mg/dL
Hgb urine dipstick: NEGATIVE
Ketones, ur: NEGATIVE mg/dL
Leukocytes,Ua: NEGATIVE
Nitrite: NEGATIVE
Protein, ur: NEGATIVE mg/dL
Specific Gravity, Urine: 1.011 (ref 1.005–1.030)
pH: 8 (ref 5.0–8.0)

## 2020-10-08 LAB — RESP PANEL BY RT-PCR (FLU A&B, COVID) ARPGX2
Influenza A by PCR: NEGATIVE
Influenza B by PCR: NEGATIVE
SARS Coronavirus 2 by RT PCR: NEGATIVE

## 2020-10-08 LAB — GLUCOSE, CAPILLARY: Glucose-Capillary: 126 mg/dL — ABNORMAL HIGH (ref 70–99)

## 2020-10-08 LAB — CK: Total CK: 98 U/L (ref 49–397)

## 2020-10-08 MED ORDER — SODIUM CHLORIDE 0.9 % IV SOLN: INTRAVENOUS | Status: AC

## 2020-10-08 MED ORDER — MORPHINE SULFATE (PF) 4 MG/ML IV SOLN
4.0000 mg | Freq: Once | INTRAVENOUS | Status: DC
  Filled 2020-10-08: qty 1

## 2020-10-08 MED ORDER — INSULIN ASPART PROT & ASPART (70-30 MIX) 100 UNIT/ML ~~LOC~~ SUSP
50.0000 [IU] | Freq: Two times a day (BID) | SUBCUTANEOUS | Status: DC
  Administered 2020-10-09 (×2): 50 [IU] via SUBCUTANEOUS
  Filled 2020-10-08: qty 10

## 2020-10-08 MED ORDER — GABAPENTIN 100 MG PO CAPS
200.0000 mg | ORAL_CAPSULE | Freq: Two times a day (BID) | ORAL | Status: DC
  Administered 2020-10-08 – 2020-10-10 (×4): 200 mg via ORAL
  Filled 2020-10-08 (×4): qty 2

## 2020-10-08 MED ORDER — ONDANSETRON HCL 4 MG/2ML IJ SOLN: 4.0000 mg | Freq: Four times a day (QID) | INTRAMUSCULAR | Status: DC | PRN

## 2020-10-08 MED ORDER — ONDANSETRON HCL 4 MG/2ML IJ SOLN
4.0000 mg | Freq: Once | INTRAMUSCULAR | Status: AC
  Administered 2020-10-08: 4 mg via INTRAVENOUS
  Filled 2020-10-08: qty 2

## 2020-10-08 MED ORDER — SODIUM CHLORIDE 0.9 % IV BOLUS
1000.0000 mL | Freq: Once | INTRAVENOUS | Status: AC
  Administered 2020-10-08: 1000 mL via INTRAVENOUS

## 2020-10-08 MED ORDER — ENSURE ENLIVE PO LIQD
237.0000 mL | Freq: Two times a day (BID) | ORAL | Status: DC
  Administered 2020-10-09 – 2020-10-10 (×3): 237 mL via ORAL

## 2020-10-08 MED ORDER — ASPIRIN 81 MG PO CHEW
81.0000 mg | CHEWABLE_TABLET | Freq: Every day | ORAL | Status: DC
  Filled 2020-10-08: qty 1

## 2020-10-08 MED ORDER — MYCOPHENOLATE SODIUM 180 MG PO TBEC
360.0000 mg | DELAYED_RELEASE_TABLET | Freq: Every day | ORAL | Status: DC
  Administered 2020-10-09 – 2020-10-10 (×2): 360 mg via ORAL
  Filled 2020-10-08 (×2): qty 2

## 2020-10-08 MED ORDER — TACROLIMUS 1 MG PO CAPS
5.0000 mg | ORAL_CAPSULE | Freq: Two times a day (BID) | ORAL | Status: DC
  Administered 2020-10-08 – 2020-10-10 (×4): 5 mg via ORAL
  Filled 2020-10-08 (×4): qty 5

## 2020-10-08 MED ORDER — ATORVASTATIN CALCIUM 40 MG PO TABS
40.0000 mg | ORAL_TABLET | Freq: Every day | ORAL | Status: DC
  Administered 2020-10-09 – 2020-10-10 (×2): 40 mg via ORAL
  Filled 2020-10-08 (×2): qty 1

## 2020-10-08 MED ORDER — NORTRIPTYLINE HCL 25 MG PO CAPS
100.0000 mg | ORAL_CAPSULE | Freq: Every day | ORAL | Status: DC
  Administered 2020-10-08 – 2020-10-09 (×2): 100 mg via ORAL
  Filled 2020-10-08 (×2): qty 4

## 2020-10-08 MED ORDER — ACETAMINOPHEN 325 MG PO TABS
650.0000 mg | ORAL_TABLET | Freq: Four times a day (QID) | ORAL | Status: DC | PRN
  Administered 2020-10-09: 650 mg via ORAL
  Filled 2020-10-08 (×2): qty 2

## 2020-10-08 MED ORDER — ONDANSETRON HCL 4 MG PO TABS: 4.0000 mg | ORAL_TABLET | Freq: Four times a day (QID) | ORAL | Status: DC | PRN

## 2020-10-08 MED ORDER — ACETAMINOPHEN 650 MG RE SUPP: 650.0000 mg | Freq: Four times a day (QID) | RECTAL | Status: DC | PRN

## 2020-10-08 MED ORDER — TRAMADOL HCL 50 MG PO TABS
50.0000 mg | ORAL_TABLET | Freq: Once | ORAL | Status: AC
  Administered 2020-10-08: 50 mg via ORAL
  Filled 2020-10-08: qty 1

## 2020-10-08 MED ORDER — ASPIRIN EC 81 MG PO TBEC
81.0000 mg | DELAYED_RELEASE_TABLET | Freq: Every day | ORAL | Status: DC
  Administered 2020-10-08 – 2020-10-09 (×2): 81 mg via ORAL
  Filled 2020-10-08 (×2): qty 1

## 2020-10-08 MED ORDER — GABAPENTIN 100 MG PO CAPS
100.0000 mg | ORAL_CAPSULE | ORAL | Status: DC
  Administered 2020-10-09: 100 mg via ORAL
  Filled 2020-10-08: qty 1

## 2020-10-08 MED ORDER — SENNOSIDES-DOCUSATE SODIUM 8.6-50 MG PO TABS: 1.0000 | ORAL_TABLET | Freq: Every evening | ORAL | Status: DC | PRN

## 2020-10-08 MED ORDER — INSULIN ASPART 100 UNIT/ML IJ SOLN
0.0000 [IU] | Freq: Three times a day (TID) | INTRAMUSCULAR | Status: DC
Start: 2020-10-09 — End: 2020-10-10
  Administered 2020-10-09 – 2020-10-10 (×2): 1 [IU] via SUBCUTANEOUS

## 2020-10-08 MED ORDER — MYCOPHENOLATE SODIUM 180 MG PO TBEC
180.0000 mg | DELAYED_RELEASE_TABLET | Freq: Every day | ORAL | Status: DC
  Administered 2020-10-08 – 2020-10-09 (×2): 180 mg via ORAL
  Filled 2020-10-08 (×2): qty 1

## 2020-10-08 MED ORDER — CLOPIDOGREL BISULFATE 75 MG PO TABS
75.0000 mg | ORAL_TABLET | Freq: Every day | ORAL | Status: DC
  Administered 2020-10-09 – 2020-10-10 (×2): 75 mg via ORAL
  Filled 2020-10-08 (×2): qty 1

## 2020-10-08 MED ORDER — HEPARIN SODIUM (PORCINE) 5000 UNIT/ML IJ SOLN
5000.0000 [IU] | Freq: Three times a day (TID) | INTRAMUSCULAR | Status: DC
  Administered 2020-10-08 – 2020-10-10 (×5): 5000 [IU] via SUBCUTANEOUS
  Filled 2020-10-08 (×5): qty 1

## 2020-10-08 MED ORDER — FENTANYL CITRATE (PF) 100 MCG/2ML IJ SOLN
50.0000 ug | Freq: Once | INTRAMUSCULAR | Status: AC
  Administered 2020-10-08: 50 ug via INTRAVENOUS
  Filled 2020-10-08: qty 2

## 2020-10-08 NOTE — Progress Notes (Signed)
Pt declined use of nocturnal cpap.  Pt stated that he does not wear cpap anymore at home and does not want to wear it while here in the hospital.  Rx changed to prn.

## 2020-10-08 NOTE — ED Notes (Signed)
Pt requires assistance when transferring.

## 2020-10-08 NOTE — ED Provider Notes (Signed)
[facility] DEPT Provider Note   CSN: [id number] Arrival date & time: 10/08/20  1605     History Chief Complaint  Patient presents with  . Fall  . Dizziness    [name] is a 70 y.o. male hx of CHF, CKD, CAD, DM, here presenting with fall.  Patient has recurrent falls.  Patient apparently fell 2 days ago at his kitchen.  He states that he did hit his head at that time.  Patient did not get checked out.  Patient had another fall today when he was sitting on the side of the bed and fell onto the left shoulder and complains of left-sided pain.  Patient apparently called his doctor's office and was sent in for multiple falls and may need a wheelchair.  Patient already has home health at home.  At baseline, patient does have neuropathy on the left side and is weaker on the left side.  The history is provided by the patient.       Past Medical History:  Diagnosis Date  . Allergy   . Angina   . Arthritis   . Backache, unspecified   . CHF (congestive heart failure) ([place])   . Chills   . Chronic kidney disease, stage III (moderate) (HCC)    HD T- TH-SAT  . Complication of anesthesia    01/2011 could not eat,, hospt x2, was placed on hd and cleared up  . Coronary atherosclerosis of native coronary artery   . Diabetes mellitus 2004  . Dizziness   . Dysphagia, unspecified(787.20)   . Fall at home 11/2019  . Gastroparesis   . Headache(784.0)   . Hemorrhage of rectum and anus   . Hyperlipidemia   . Hypertrophy of prostate with urinary obstruction and other lower urinary tract symptoms (LUTS)   . INTERNAL HEMORRHOIDS 10/16/2008   Qualifier: Diagnosis of  By: Nolon Rod CMA (AAMA), Robin    . Internal hemorrhoids without mention of complication   . Myocardial infarction ([place]) 2002  . Nausea alone   . Obesity   . Other dyspnea and respiratory abnormality   . Other malaise and fatigue   . Personal history of unspecified circulatory disease   .  Posttraumatic stress disorder   . Prostate cancer ([place])   . Sleep apnea    USES CPAP   . Stroke [facility])    2007  . Unspecified essential hypertension    hx htn     Patient Active Problem List   Diagnosis Date Noted  . Stable angina ([place]) 09/16/2020  . Pre-operative clearance 06/20/2020  . Protrusion of lumbar intervertebral disc   . Malignant neoplasm of prostate ([place]) 06/09/2019  . PAD (peripheral artery disease) ([place]) 01/16/2019  . Spinal stenosis of lumbar region 09/21/2018  . Morbid obesity ([place]) 12/04/2017  . Mild cognitive impairment 06/18/2016  . Hyperlipidemia 01/16/2016  . Immunosuppressive management encounter following kidney transplant 07/12/2015  . Renal transplant recipient 06/27/2015  . Immunosuppression ([place]) 06/27/2015  . Thoracic aortic aneurysm ([place]) 10/23/2014  . GERD (gastroesophageal reflux disease) 05/16/2014  . Trigger thumb of right hand 01/03/2014  . Left foot drop 05/02/2013  . ED (erectile dysfunction) 04/03/2013  . H/O iron deficiency anemia 02/10/2012  . Risk for falls 01/22/2012  . Diabetic neuropathy ([place]) 10/14/2010  . Gout 12/22/2009  . Obstructive sleep apnea 07/10/2009  . CAD -s/p DES to marginal vessel at [facility] 2014 04/09/2009  . BPH (benign prostatic hyperplasia) 09/25/2008  . Gastroparesis 06/19/2008  . Low  back pain 12/15/2007  . Posttraumatic stress disorder 11/03/2007  . 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 Allergic rhinitis 03/01/2007  . Essential hypertension 01/17/2007  . History of stroke 01/17/2007    Past Surgical History:  Procedure Laterality Date  . ANAL FISSURECTOMY    . AV FISTULA PLACEMENT    . CARDIAC CATHETERIZATION     2005 DR BRODIE  . HEMORRHOID SURGERY    . 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 PROSTATE BIOPSY    . revision of fistula     renal failure  . VENTRAL HERNIA REPAIR  07/01/2011   Procedure: HERNIA REPAIR VENTRAL ADULT;  Surgeon:  Joyice Faster. Cornett, MD;  Location: [facility] OR;  Service: General;  Laterality: N/A;       Family History  Problem Relation Age of Onset  . Heart disease Father   . Renal Disease Father   . Dementia Mother   . Heart attack Mother   . Diabetes Sister   . Heart disease Sister   . Diabetes Maternal Aunt   . Diabetes Maternal Uncle   . Diabetes Paternal Aunt   . Diabetes Paternal Uncle   . Heart disease Other   . Heart attack Brother   . Lung cancer Sister   . Renal Disease Brother   . Ovarian cancer Cousin   . Lung cancer Cousin   . Breast cancer Neg Hx   . Colon cancer Neg Hx   . Pancreatic cancer Neg Hx   . Prostate cancer Neg Hx     Social History   Tobacco Use  . Smoking status: Never Smoker  . Smokeless tobacco: Never Used  Vaping Use  . Vaping Use: Never used  Substance Use Topics  . Alcohol use: No  . Drug use: No    Home Medications Prior to Admission medications   Medication Sig Start Date End Date Taking? Authorizing Provider  acetaminophen (TYLENOL) 500 MG tablet Take 1,000 mg by mouth as needed for mild pain or headache.     [provider]  allopurinol (ZYLOPRIM) 100 MG tablet Take 200 mg by mouth daily.    [provider]  AMBULATORY NON FORMULARY MEDICATION Ankle Foot Orthosis  Foot Drop M21.372 Disp 1 03/04/20   Gregor Hams, MD  AMBULATORY NON FORMULARY MEDICATION Rolling Walker.  Foot drop M21.372 03/04/20   Gregor Hams, MD  aspirin 81 MG tablet Take 81 mg by mouth at bedtime.    [provider]  atorvastatin (LIPITOR) 40 MG tablet Take 40 mg by mouth daily.    [provider]  clopidogrel (PLAVIX) 75 MG tablet Take 75 mg by mouth daily.    [provider]  diclofenac Sodium (VOLTAREN) 1 % GEL Apply 2 g topically 4 (four) times daily. 12/21/19   Geradine Girt, DO  furosemide (LASIX) 40 MG tablet Take 1 tablet (40 mg total) by mouth daily as needed for fluid. 12/22/19   Geradine Girt, DO  gabapentin  (NEURONTIN) 300 MG capsule TAKE 2 CAPSULES IN MORNING, 1 CAPSULE IN AFTERNOON, AND 2 CAPSULES AT NIGHT. 07/23/20   Tomi Likens, Adam R, DO  insulin aspart protamine- aspart (NOVOLOG MIX 70/30) (70-30) 100 UNIT/ML injection Inject 75 Units into the skin 2 (two) times daily with a meal.     [provider]  Insulin Syringe-Needle U-100 (B-D INS SYR ULTRAFINE 1CC/31G) 31G X 5/16" 1 ML MISC Used to give insulin injections twice daily. 01/10/20  [name], MD  lidocaine (LIDODERM) 5 % Place 1 patch onto the skin daily. Remove & Discard patch within 12 hours or as directed by MD 12/22/19   Geradine Girt, DO  Magnesium 200 MG TABS Take 200 mg by mouth every evening.     [provider]  mycophenolate (MYFORTIC) 180 MG EC tablet Take 180-360 mg by mouth See admin instructions. Take 2 tablets (360mg ) by mouth in the morning and 1 tablet (180mg ) by mouth at night.    [provider]  nitroGLYCERIN (NITROSTAT) 0.4 MG SL tablet PLACE 1 TABLET (0.4 MG TOTAL) UNDER THE TONGUE EVERY 5 (FIVE) MINUTES AS NEEDED FOR CHEST PAIN. 01/25/18   Skeet Latch, MD  nortriptyline (PAMELOR) 50 MG capsule TAKE 2 CAPSULES (100 MG TOTAL) BY MOUTH AT BEDTIME. 01/01/20   Jaffe, Adam R, DO  omeprazole (PRILOSEC) 20 MG capsule TAKE 1 CAPSULE (20 MG TOTAL) BY MOUTH 2 (TWO) TIMES DAILY BEFORE A MEAL. 07/04/20   [name], MD  Semaglutide,0.25 or 0.5MG /DOS, (OZEMPIC, 0.25 OR 0.5 MG/DOSE,) 2 MG/1.5ML SOPN Inject 1 mg into the skin once a week.    [provider]  tacrolimus (PROGRAF) 5 MG capsule Take 5 mg by mouth 2 (two) times daily.  11/26/17   [provider]  tiZANidine (ZANAFLEX) 2 MG tablet Take 1 tablet (2 mg total) by mouth at bedtime. 08/29/20   Pieter Partridge, DO  Vitamin D, Ergocalciferol, (DRISDOL) 1.25 MG (50000 UNIT) CAPS capsule Take 50,000 Units by mouth 2 (two) times a week. 04/26/19   [provider]    Allergies    Codeine  Review of Systems   Review of Systems   Neurological: Positive for dizziness.  All other systems reviewed and are negative.   Physical Exam Updated Vital Signs BP (!) 166/102   Pulse 93   Temp 98 F (36.7 C) (Oral)   Resp 16   Ht 5\' 10"  (1.778 m)   Wt 108 kg   SpO2 100%   BMI 34.16 kg/m   Physical Exam Vitals and nursing note reviewed.  Constitutional:      Appearance: Normal appearance.  HENT:     Head: Normocephalic.     Comments: Mild tenderness on the left scalp    Nose: Nose normal.     Mouth/Throat:     Mouth: Mucous membranes are moist.  Eyes:     Extraocular Movements: Extraocular movements intact.     Pupils: Pupils are equal, round, and reactive to light.  Cardiovascular:     Rate and Rhythm: Normal rate and regular rhythm.     Pulses: Normal pulses.     Heart sounds: Normal heart sounds.  Pulmonary:     Effort: Pulmonary effort is normal.     Breath sounds: Normal breath sounds.  Abdominal:     General: Abdomen is flat.     Palpations: Abdomen is soft.  Musculoskeletal:     Cervical back: Normal range of motion and neck supple.     Comments: Mild tenderness of the left shoulder and left side of the ribs and left hip.  There is no spinal tenderness.  Skin:    General: Skin is warm.     Capillary Refill: Capillary refill takes less than 2 seconds.  Neurological:     Mental Status: He is alert.     Comments: No obvious facial droop.  Patient's normal strength bilateral arms.  Patient does have 4 out of 5 strength of  the left leg.  He states that this is chronic.  Patient also has some hip pain with range of motion so I am not sure if this is pain related.  Patient does have normal sensation throughout  Psychiatric:        Mood and Affect: Mood normal.        Behavior: Behavior normal.     ED Results / Procedures / Treatments   Labs (all labs ordered are listed, but only abnormal results are displayed) Labs Reviewed  CBC WITH DIFFERENTIAL/PLATELET - Abnormal; Notable for the following  components:      Result Value   WBC 3.7 (*)    Lymphs Abs 0.5 (*)    All other components within normal limits  COMPREHENSIVE METABOLIC PANEL - Abnormal; Notable for the following components:   Chloride 96 (*)    CO2 33 (*)    Glucose, Bld 148 (*)    BUN 25 (*)    Creatinine, Ser 2.08 (*)    GFR, Estimated 33 (*)    All other components within normal limits  RESP PANEL BY RT-PCR (FLU A&B, COVID) ARPGX2  CK  URINALYSIS, ROUTINE W REFLEX MICROSCOPIC    EKG EKG Interpretation  Date/Time:  Tuesday Oct 08 2020 16:42:41 EDT Ventricular Rate:  89 PR Interval:  212 QRS Duration: 106 QT Interval:  360 QTC Calculation: 438 R Axis:   -46 Text Interpretation: Sinus rhythm Abnormal R-wave progression, late transition Inferior infarct, old Lateral leads are also involved No significant change since last tracing Confirmed by Wandra Arthurs [phone]) on 10/08/2020 4:54:19 PM   Radiology No results found.  Procedures Procedures   Medications Ordered in ED Medications  fentaNYL (SUBLIMAZE) injection 50 mcg (has no administration in time range)  ondansetron (ZOFRAN) injection 4 mg (has no administration in time range)  sodium chloride 0.9 % bolus 1,000 mL (0 mLs Intravenous Stopped 10/08/20 1920)  traMADol (ULTRAM) tablet 50 mg (50 mg Oral Given 10/08/20 1801)    ED Course  I have reviewed the triage vital signs and the nursing notes.  Pertinent labs & imaging results that were available during my care of the patient were reviewed by me and considered in my medical decision making (see chart for details).    MDM Rules/Calculators/A&P                         LELAND RAVER is a 70 y.o. male here presenting with left hip pain and left shoulder pain after fall.  Patient also hit his head.  This is a recurrent problem plan to get CT head and neck and x-rays and basic lab work. We will also consult Social work regarding placement.  7:51 PM Labs showed AKI with creatinine of 2.  CT head and  neck unremarkable.  Patient has a hard time ambulating.  At this point, will admit for AKI.  Patient is interested to get rehab.  I discussed case with patient and family.  Also had social work started to work on placement.    Final Clinical Impression(s) / ED Diagnoses Final diagnoses:  None    Rx / DC Orders ED Discharge Orders    None       Drenda Freeze, MD 10/08/20 [phone]

## 2020-10-08 NOTE — Telephone Encounter (Signed)
Jonnie Finner RN with [facility] has sent a message over inquiring on the status of Hospital bed and wheelchair orders placed through the [place].

## 2020-10-08 NOTE — H&P (Signed)
History and Physical    [name] PPI:[id number] DOB: 04-Jul-1950 DOA: 10/08/2020  PCP: [name], MD  Patient coming from: Home  I have personally briefly reviewed patient's old medical records in [facility]  Chief Complaint: Frequent falls  HPI: [name] is a 70 y.o. male with medical history significant for CKD s/p renal transplant 2017 on chronic immunosuppression, CAD s/p DES in 2014, history of stroke, insulin-dependent diabetes, OSA, and frequent falls due to chronic left sided weakness and diabetic polyneuropathy who presents to the ED for evaluation of falls at home.  Patient reports more frequent falls at home.  He had several falls over the weekend, 1 time hitting his left lower back on the edge of the kitchen counter.  He states he has difficulty with coordination and knowing where his feet are due to his significant polyneuropathy.  He has had occasional lightheadedness and reports previous episodes of orthostatic hypotension but this has not been occurring often with his recent falls.  He did not hit his head or lose consciousness.  He has been working with home health PT.  He has had occasional nausea without emesis.  He denies any significant chest pain, dyspnea, dysuria.  He reports chronic intermittent abdominal pain at his surgical scar but no other acute abdominal pain.  ED Course:  Initial vitals showed BP 142/90, pulse 97, RR 19, temp 98.0 F, SPO2 99% on room air.  Orthostatic vital signs were negative.  Labs show WBC 3.7, hemoglobin 15.1, platelets 252,000, sodium 140, potassium 4.2, bicarb 33, BUN 25, creatinine 2.08 (previously 1.40 on 09/16/2020), serum glucose 148, LFTs within normal limits, CK 98.  Urinalysis is negative for UTI.  SARS-CoV-2 PCR panel was in process.  CT head without contrast shows generalized cerebral atrophy without acute intracranial abnormality.  CT cervical spine is negative for acute fracture or static subluxation of  the C-spine.  2.3 cm right thyroid nodule is noted.  Left shoulder x-ray showed degenerative change without acute abnormality.  Left hip x-ray is negative for acute osseous abnormality.  Chest with left rib x-ray is negative for acute cardiopulmonary findings.  No displaced rib fracture or pneumothorax seen.  Patient was given 1 L normal saline, Zofran, tramadol, fentanyl.  The hospitalist service was consulted to admit for further evaluation and management.  Review of Systems: All systems reviewed and are negative except as documented in history of present illness above.   Past Medical History:  Diagnosis Date  . Allergy   . Angina   . Arthritis   . Backache, unspecified   . CHF (congestive heart failure) ([place])   . Chills   . Chronic kidney disease, stage III (moderate) (HCC)    HD T- TH-SAT  . Complication of anesthesia    01/2011 could not eat,, hospt x2, was placed on hd and cleared up  . Coronary atherosclerosis of native coronary artery   . Diabetes mellitus 2004  . Dizziness   . Dysphagia, unspecified(787.20)   . Fall at home 11/2019  . Gastroparesis   . Headache(784.0)   . Hemorrhage of rectum and anus   . Hyperlipidemia   . Hypertrophy of prostate with urinary obstruction and other lower urinary tract symptoms (LUTS)   . INTERNAL HEMORRHOIDS 10/16/2008   Qualifier: Diagnosis of  By: Nolon Rod CMA (AAMA), Robin    . Internal hemorrhoids without mention of complication   . Myocardial infarction ([place]) 2002  . Nausea alone   . Obesity   .  Other dyspnea and respiratory abnormality   . Other malaise and fatigue   . Personal history of unspecified circulatory disease   . Posttraumatic stress disorder   . Prostate cancer ([place])   . Sleep apnea    USES CPAP   . Stroke [facility])    2007  . Unspecified essential hypertension    hx htn     Past Surgical History:  Procedure Laterality Date  . ANAL FISSURECTOMY    . AV FISTULA PLACEMENT    . CARDIAC CATHETERIZATION      2005 DR BRODIE  . HEMORRHOID SURGERY    . 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 PROSTATE BIOPSY    . revision of fistula     renal failure  . VENTRAL HERNIA REPAIR  07/01/2011   Procedure: HERNIA REPAIR VENTRAL ADULT;  Surgeon: Joyice Faster. Cornett, MD;  Location: [facility];  Service: General;  Laterality: N/A;    Social History:  reports that he has never smoked. He has never used smokeless tobacco. He reports that he does not drink alcohol and does not use drugs.  Allergies  Allergen Reactions  . Codeine     Family History  Problem Relation Age of Onset  . Heart disease Father   . Renal Disease Father   . Dementia Mother   . Heart attack Mother   . Diabetes Sister   . Heart disease Sister   . Diabetes Maternal Aunt   . Diabetes Maternal Uncle   . Diabetes Paternal Aunt   . Diabetes Paternal Uncle   . Heart disease Other   . Heart attack Brother   . Lung cancer Sister   . Renal Disease Brother   . Ovarian cancer Cousin   . Lung cancer Cousin   . Breast cancer Neg Hx   . Colon cancer Neg Hx   . Pancreatic cancer Neg Hx   . Prostate cancer Neg Hx      Prior to Admission medications   Medication Sig Start Date End Date Taking? Authorizing Provider  acetaminophen (TYLENOL) 500 MG tablet Take 1,000 mg by mouth as needed for mild pain or headache.     [provider]  allopurinol (ZYLOPRIM) 100 MG tablet Take 200 mg by mouth daily.    [provider]  AMBULATORY NON FORMULARY MEDICATION Ankle Foot Orthosis  Foot Drop M21.372 Disp 1 03/04/20   Gregor Hams, MD  AMBULATORY NON FORMULARY MEDICATION [place].  Foot drop M21.372 03/04/20   Gregor Hams, MD  aspirin 81 MG tablet Take 81 mg by mouth at bedtime.    [provider]  atorvastatin (LIPITOR) 40 MG tablet Take 40 mg by mouth daily.    [provider]  clopidogrel (PLAVIX) 75 MG tablet Take 75 mg by mouth daily.    [provider]  diclofenac Sodium (VOLTAREN) 1 % GEL Apply 2 g  topically 4 (four) times daily. 12/21/19   Geradine Girt, DO  furosemide (LASIX) 40 MG tablet Take 1 tablet (40 mg total) by mouth daily as needed for fluid. 12/22/19   Geradine Girt, DO  gabapentin (NEURONTIN) 300 MG capsule TAKE 2 CAPSULES IN MORNING, 1 CAPSULE IN AFTERNOON, AND 2 CAPSULES AT NIGHT. 07/23/20   Tomi Likens, Adam R, DO  insulin aspart protamine- aspart (NOVOLOG MIX 70/30) (70-30) 100 UNIT/ML injection Inject 75 Units into the skin 2 (two) times daily with a meal.     [provider]  Insulin Syringe-Needle U-100 (B-D INS SYR  ULTRAFINE 1CC/31G) 31G X 5/16" 1 ML MISC Used to give insulin injections twice daily. 01/10/20   [name], MD  lidocaine (LIDODERM) 5 % Place 1 patch onto the skin daily. Remove & Discard patch within 12 hours or as directed by MD 12/22/19   Geradine Girt, DO  Magnesium 200 MG TABS Take 200 mg by mouth every evening.     [provider]  mycophenolate (MYFORTIC) 180 MG EC tablet Take 180-360 mg by mouth See admin instructions. Take 2 tablets (360mg ) by mouth in the morning and 1 tablet (180mg ) by mouth at night.    [provider]  nitroGLYCERIN (NITROSTAT) 0.4 MG SL tablet PLACE 1 TABLET (0.4 MG TOTAL) UNDER THE TONGUE EVERY 5 (FIVE) MINUTES AS NEEDED FOR CHEST PAIN. 01/25/18   Skeet Latch, MD  nortriptyline (PAMELOR) 50 MG capsule TAKE 2 CAPSULES (100 MG TOTAL) BY MOUTH AT BEDTIME. 01/01/20   Jaffe, Adam R, DO  omeprazole (PRILOSEC) 20 MG capsule TAKE 1 CAPSULE (20 MG TOTAL) BY MOUTH 2 (TWO) TIMES DAILY BEFORE A MEAL. 07/04/20   [name], MD  Semaglutide,0.25 or 0.5MG /DOS, (OZEMPIC, 0.25 OR 0.5 MG/DOSE,) 2 MG/1.5ML SOPN Inject 1 mg into the skin once a week.    [provider]  tacrolimus (PROGRAF) 5 MG capsule Take 5 mg by mouth 2 (two) times daily.  11/26/17   [provider]  tiZANidine (ZANAFLEX) 2 MG tablet Take 1 tablet (2 mg total) by mouth at bedtime. 08/29/20   Pieter Partridge, DO  Vitamin D,  Ergocalciferol, (DRISDOL) 1.25 MG (50000 UNIT) CAPS capsule Take 50,000 Units by mouth 2 (two) times a week. 04/26/19   [provider]    Physical Exam: Vitals:   10/08/20 1802 10/08/20 1905 10/08/20 1935 10/08/20 2019  BP: (!) 157/92 (!) 182/106 (!) 166/102 (!) 175/106  Pulse: 91 92 93 90  Resp: 19 17 16 15   Temp:    98.2 F (36.8 C)  TempSrc:      SpO2: 97% 95% 100% 96%  Weight:      Height:       Constitutional: Resting in bed with head elevated, NAD, calm, comfortable Eyes: PERRL, lids and conjunctivae normal ENMT: Mucous membranes are moist. Posterior pharynx clear of any exudate or lesions.Normal dentition.  Neck: normal, supple, no masses. Respiratory: clear to auscultation bilaterally, no wheezing, no crackles. Normal respiratory effort. No accessory muscle use.  Cardiovascular: Regular rate and rhythm, no murmurs / rubs / gallops.  Trace bilateral lower extremity edema. Abdomen: no tenderness, no masses palpated. No hepatosplenomegaly. Bowel sounds positive.  Musculoskeletal: no clubbing / cyanosis. No joint deformity upper and lower extremities.  Diminished ROM left lower extremity at the ankle and hip, no contractures. Normal muscle tone.  Skin: Abdominal surgical scar noted.  No rashes, lesions, ulcers. No induration Neurologic: CN 2-12 grossly intact. Sensation diminished distal left lower leg. Strength 5/5 in right upper and right lower extremity, 4/5 LUE, 2/5 LLE at the ankle with plantarflexion and leg raise off bed. Psychiatric: Normal judgment and insight. Alert and oriented x 3. Normal mood.   Labs on Admission: I have personally reviewed following labs and imaging studies  CBC: Recent Labs  Lab 10/08/20 1709  WBC 3.7*  NEUTROABS 2.7  HGB 15.1  HCT 50.2  MCV 90.8  PLT 381   Basic Metabolic Panel: Recent Labs  Lab 10/08/20 1709  NA 140  K 4.2  CL 96*  CO2 33*  GLUCOSE 148*  BUN 25*  CREATININE 2.08*  CALCIUM 9.9   GFR: Estimated  Creatinine Clearance: 40.1 mL/min (A) (by C-G formula based on SCr of 2.08 mg/dL (H)). Liver Function Tests: Recent Labs  Lab 10/08/20 1709  AST 17  ALT 11  ALKPHOS 84  BILITOT 1.2  PROT 7.9  ALBUMIN 4.1   No results for input(s): LIPASE, AMYLASE in the last 168 hours. No results for input(s): AMMONIA in the last 168 hours. Coagulation Profile: No results for input(s): INR, PROTIME in the last 168 hours. Cardiac Enzymes: Recent Labs  Lab 10/08/20 1709  CKTOTAL 98   BNP (last 3 results) No results for input(s): PROBNP in the last 8760 hours. HbA1C: No results for input(s): HGBA1C in the last 72 hours. CBG: No results for input(s): GLUCAP in the last 168 hours. Lipid Profile: No results for input(s): CHOL, HDL, LDLCALC, TRIG, CHOLHDL, LDLDIRECT in the last 72 hours. Thyroid Function Tests: No results for input(s): TSH, T4TOTAL, FREET4, T3FREE, THYROIDAB in the last 72 hours. Anemia Panel: No results for input(s): VITAMINB12, FOLATE, FERRITIN, TIBC, IRON, RETICCTPCT in the last 72 hours. Urine analysis:    Component Value Date/Time   COLORURINE YELLOW 10/08/2020 1820   APPEARANCEUR CLEAR 10/08/2020 1820   LABSPEC 1.011 10/08/2020 1820   PHURINE 8.0 10/08/2020 1820   GLUCOSEU NEGATIVE 10/08/2020 1820   HGBUR NEGATIVE 10/08/2020 1820   BILIRUBINUR NEGATIVE 10/08/2020 1820   KETONESUR NEGATIVE 10/08/2020 1820   PROTEINUR NEGATIVE 10/08/2020 1820   UROBILINOGEN 0.2 01/15/2015 1642   NITRITE NEGATIVE 10/08/2020 1820   LEUKOCYTESUR NEGATIVE 10/08/2020 1820    Radiological Exams on Admission: No results found.  EKG: Personally reviewed. Sinus rhythm, late R wave transition.  Q waves inferior leads.  Not significantly changed when compared to prior.  Assessment/Plan Active Problems:   Acute kidney injury superimposed on chronic kidney disease ([place])   SHERREL SHAFER is a 70 y.o. male with medical history significant for CKD s/p renal transplant 2017 on chronic  immunosuppression, CAD s/p DES in 2014, history of stroke, insulin-dependent diabetes, OSA, and frequent falls due to chronic left sided weakness and diabetic polyneuropathy who is admitted with AKI.  AKI on CKD stage III S/p renal transplant: Suspect prerenal.  Continue IV fluid hydration overnight.  Hold home Lasix.  Repeat labs in AM.  Continue Myfortic and Prograf.  Frequent falls: Chronic issue secondary to chronic left-sided weakness and significant diabetic polyneuropathy.  Did not have any significant injury on extensive imaging work-up.  Has been working with home health PT. -PT/OT -Continue gabapentin, nortriptyline  CAD s/p DES: Chronic and stable.  Continue aspirin, Plavix, atorvastatin.  History of CVA: Continue aspirin, Plavix, statin.  Insulin-dependent diabetes: Continue reduced home NovoLog 70/30 50 units twice daily with meals, sensitive SSI.  Thyroid nodule: 2.3 cm right thyroid nodule seen on CT imaging.  Had previous ultrasound imaging in 2017 which showed a right lobe nodule which do not appear suspicious and mildly suspicious appearing left lobe nodule.  Follow-up as outpatient.  OSA: Continue CPAP nightly.  DVT prophylaxis: Subcutaneous heparin Code Status: Full code, confirmed with patient Family Communication: Discussed with patient, he has discussed with family Disposition Plan: From home, dispo to home with home health versus SNF Consults called: None Level of care: Med-Surg Admission status:  Status is: Observation  The patient remains OBS appropriate and will d/c before 2 midnights.  Dispo: The patient is from: Home  Anticipated d/c is to: Home versus SNF              Patient currently is not medically stable to d/c.   Difficult to place patient No    Zada Finders MD [facility]  If 7PM-7AM, please contact night-coverage [email or web address]  10/08/2020, 9:33 PM

## 2020-10-08 NOTE — ED Notes (Signed)
Patient complaining of new headache. RN performed neuro assessment, within defined limits right now. No slur or speech or onset confusion. RN notified Dr. Darl Householder about medication for patients headache.

## 2020-10-08 NOTE — ED Triage Notes (Signed)
Patient BIB [facility] after a fall.  Patient had some dizziness and fell.  Patient had had multiple falls lately due to dizziness.  Patient fell yesterday and hit his left side, now having left shoulder and side pain.  Patient takes Plavix and did hit her head yesterday, was seen by EMS but didn't come to the hospital.  Kidney transplant 5 years ago.  Vitals were 14/90 92-HR 96% on room air 162-CBG

## 2020-10-08 NOTE — ED Notes (Signed)
Transport called to take patient upstairs 

## 2020-10-08 NOTE — Telephone Encounter (Signed)
After speaking with Jaz, I have informed [place] that patient would need to go to ED.  She has informed patient.    They understood and will be calling EMS for transport to ED.    Please follow up in regard to [facility] orders and status on hospital bed.    Thanks

## 2020-10-08 NOTE — TOC Initial Note (Signed)
Transition of Care [facility]) - Initial/Assessment Note    Patient Details  Name: [name] MRN: [id number] Date of Birth: 04/24/1951  Transition of Care [facility]) CM/SW Contact:    Erenest Rasher, RN Phone Number: [phone] 10/08/2020, 5:57 PM   Clinical Narrative:                 TOC CM spoke to pt's dtr, Yvetta Coder. States pt lives in the home with her and her brother. States pt has aide through the [place] with [facility]. The aide comes MWF from 10-1 pm and Sunday, 10-2 pm. He has [place] with [facility]. Left message with [facility] rep, Amy that pt was in ED. He has RW at home. The PCP's office has ordered pt a wheelchair for home. Message sent to provider's office to follow up on wheelchair. Dtr states they are follow up on wheelchair through the [place]. Dtr states she would not be opposed to pt going to SNF rehab. ED provider updated.   Expected Discharge Plan: [place] Barriers to Discharge: Continued Medical Work up   Patient Goals and CMS Choice Patient states their goals for this hospitalization and ongoing recovery are:: to return home with [facility] CMS Medicare.gov Compare Post Acute Care list provided to:: Patient Represenative (must comment) (dtr-Tamisha Joye) Choice offered to / list presented to : Adult Children  Expected Discharge Plan and Services Expected Discharge Plan: [place] In-house Referral: Clinical Social Work Discharge Planning Services: CM Consult Post Acute Care Choice: [place] arrangements for the past 2 months: [place]                   DME Agency: Other - [place] ([place])       [place] Arranged: RN,PT [place] Agency: [place] Date [place]: 10/08/20 Time [place]: 1705 Representative spoke with at [place]: Gevena Barre  Prior Living Arrangements/Services Living arrangements for the past 2 months: [place] with:: Adult  Children Patient language and need for interpreter reviewed:: No Do you feel safe going back to the place where you live?: Yes      Need for Family Participation in Patient Care: Yes (Comment) Care giver support system in place?: Yes (comment) Current home services: DME ([facility]) Criminal Activity/Legal Involvement Pertinent to Current Situation/Hospitalization: No - Comment as needed  Activities of Daily Living      Permission Sought/Granted Permission sought to share information with : Case Manager Permission granted to share information with : Yes, Verbal Permission Granted  Share Information with NAME: Kleber Crean  Permission granted to share info w AGENCY: SNF, Home Health, [facility]  Permission granted to share info w Relationship: daughter  Permission granted to share info w Contact Information: [phone]  Emotional Assessment       Orientation: : Oriented to Self,Oriented to Place,Oriented to  Time,Oriented to Situation   Psych Involvement: No (comment)  Admission diagnosis:  fall, dizzy Patient Active Problem List   Diagnosis Date Noted  . Stable angina ([place]) 09/16/2020  . Pre-operative clearance 06/20/2020  . Protrusion of lumbar intervertebral disc   . Malignant neoplasm of prostate ([place]) 06/09/2019  . PAD (peripheral artery disease) ([place]) 01/16/2019  . Spinal stenosis of lumbar region 09/21/2018  . Morbid obesity ([place]) 12/04/2017  . Mild cognitive impairment 06/18/2016  . Hyperlipidemia 01/16/2016  . Immunosuppressive management encounter following kidney transplant 07/12/2015  . Renal transplant  recipient 06/27/2015  . Immunosuppression ([place]) 06/27/2015  . Thoracic aortic aneurysm ([place]) 10/23/2014  . GERD (gastroesophageal reflux disease) 05/16/2014  . Trigger thumb of right hand 01/03/2014  . Left foot drop 05/02/2013  . ED (erectile dysfunction) 04/03/2013  . H/O iron deficiency anemia 02/10/2012  . Risk for falls 01/22/2012  . Diabetic neuropathy ([place])  10/14/2010  . Gout 12/22/2009  . Obstructive sleep apnea 07/10/2009  . CAD -s/p DES to marginal vessel at [facility] 2014 04/09/2009  . BPH (benign prostatic hyperplasia) 09/25/2008  . Gastroparesis 06/19/2008  . Low back pain 12/15/2007  . Posttraumatic stress disorder 11/03/2007  . Diabetes mellitus due to underlying condition with diabetic autonomic (poly)neuropathy ([place]) 09/05/2007  . Allergic rhinitis 03/01/2007  . Essential hypertension 01/17/2007  . History of stroke 01/17/2007   PCP:  [name], MD Pharmacy:   [id number] [place], [address] [place] [address] Phone: [phone] Fax: [place], [address], [address] [place], [address] [place] [address] Phone: [phone] Fax: [phone]  [place], [address] [place] [address] Phone: [phone] Fax: [place], [place] - [place] [address] [place] [address] Phone: [phone] Fax: [phone]  [place], [place] [place] [place] [address] Phone: [phone] Fax: [phone]     Social Determinants of Health (SDOH) Interventions    Readmission Risk Interventions No flowsheet data found.

## 2020-10-08 NOTE — ED Notes (Signed)
Patient transported to X-ray 

## 2020-10-08 NOTE — Telephone Encounter (Signed)
Requesting verbal orders to extending PT:  2 times a week for 4 weeks.  Then, 1 time a week for 1 week.   Also:  Patient had a fall on Sunday 5/15 and today 5/17.  Patient is having complaints of pain in left trunk, ribs and shoulder.  Rating pain 8 out of 10.  States he hit head both times.  States having dizziness but dizziness had been ongoing since original fall months ago.  States having complaints of diabetic neuropathy (pain in both feet).    Please advise what patient should do in regard to falls?     Also:  Requesting update in the status of wheel chair order?

## 2020-10-09 DIAGNOSIS — N189 Chronic kidney disease, unspecified: Secondary | ICD-10-CM | POA: Diagnosis not present

## 2020-10-09 DIAGNOSIS — N179 Acute kidney failure, unspecified: Secondary | ICD-10-CM | POA: Diagnosis not present

## 2020-10-09 LAB — GLUCOSE, CAPILLARY
Glucose-Capillary: 138 mg/dL — ABNORMAL HIGH (ref 70–99)
Glucose-Capillary: 82 mg/dL (ref 70–99)
Glucose-Capillary: 85 mg/dL (ref 70–99)
Glucose-Capillary: 57 mg/dL — ABNORMAL LOW (ref 70–99)

## 2020-10-09 LAB — CBC
HCT: 45.7 % (ref 39.0–52.0)
Hemoglobin: 14 g/dL (ref 13.0–17.0)
MCH: 27.7 pg (ref 26.0–34.0)
MCHC: 30.6 g/dL (ref 30.0–36.0)
MCV: 90.5 fL (ref 80.0–100.0)
Platelets: 242 10*3/uL (ref 150–400)
RBC: 5.05 MIL/uL (ref 4.22–5.81)
RDW: 15 % (ref 11.5–15.5)
WBC: 3.8 10*3/uL — ABNORMAL LOW (ref 4.0–10.5)
nRBC: 0 % (ref 0.0–0.2)

## 2020-10-09 LAB — BASIC METABOLIC PANEL
Anion gap: 9 (ref 5–15)
BUN: 25 mg/dL — ABNORMAL HIGH (ref 8–23)
CO2: 32 mmol/L (ref 22–32)
Calcium: 9.6 mg/dL (ref 8.9–10.3)
Chloride: 100 mmol/L (ref 98–111)
Creatinine, Ser: 1.73 mg/dL — ABNORMAL HIGH (ref 0.61–1.24)
GFR, Estimated: 42 mL/min — ABNORMAL LOW (ref >60)
Glucose, Bld: 158 mg/dL — ABNORMAL HIGH (ref 70–99)
Potassium: 3.8 mmol/L (ref 3.5–5.1)
Sodium: 141 mmol/L (ref 135–145)

## 2020-10-09 MED ORDER — INSULIN ASPART PROT & ASPART (70-30 MIX) 100 UNIT/ML ~~LOC~~ SUSP
35.0000 [IU] | Freq: Two times a day (BID) | SUBCUTANEOUS | Status: DC
  Filled 2020-10-09: qty 10

## 2020-10-09 MED ORDER — DEXTROSE-NACL 5-0.45 % IV SOLN: INTRAVENOUS | Status: DC

## 2020-10-09 NOTE — Progress Notes (Signed)
Occupational Therapy Evaluation  Patient lives in a multi level home with daughter, Joslyn Hy and grandson. Patient also has aide that comes 3 days a week for 4-6 hours to assist with lower body dressing, IADLs. Daughter also works from home and assists as needed. Patient endorsing multiple falls, one that he had last spring at church is what really flared his pain in back per patient and has steadily been declining with gait/balance. Reports working with [facility] PT/OT and that his room at home has been set up "so it's accessible for me." Currently patient needing increased time and min A with rolling walker to ambulate to/from bathroom with x1 loss of balance in standing to void. Patient is high fall risk and discussed benefits of short term rehab, patient appears open to this. Recommend continued acute OT services to maximize patient safety, balance, endurance in order to facilitate D/C to venue listed below.    10/09/20 1135  OT Visit Information  Last OT Received On 10/09/20  Assistance Needed +1 (chair follow +2)  History of Present Illness [name] is a 70 y.o. male presents due to frequent falls. Head CT, cervical spine CT, L shoulder x-ray, L hip x-ray and chest x-ray all negative for acute abnormalities. PMH: CKD s/p renal transplant 2017 on chronic immunosuppression, CAD s/p DES in 2014, stroke 2007, diabetes, OSA, and frequent falls due to chronic left sided weakness and diabetic polyneuropathy, CHF, MI 2002, prostate cancer, HTN  Precautions  Precautions Fall  Restrictions  Weight Bearing Restrictions No  Home Living  Family/patient expects to be discharged to: Private residence  Living Arrangements Children;Other relatives (daughter, Joslyn Hy, grandson)  Available Help at Discharge Family;Available 24 hours/day (DTR works from home)  Type of [place] Access Level entry  [place] of Steps flight  Bathroom Shower/Tub Walk-in  shower  [place] bars - toilet;Grab bars - tub/shower;Shower seat;Cane - single point;Walker - 4 wheels (guying handrails for bed)  Additional Comments Drive into 1st floor (garage and bedroom), 2nd level has foyer, 3rd level (kitchen/pt's bed/bath); pt enters home from back deck with bil handrails from ground up to 2nd level  Prior Function  Level of Independence Needs assistance  Gait / Transfers Assistance Needed pt reports household ambulator with rollator, falls "just about" everyday and stepson assists with getting up  ADL's / [place] family assisting with LB dressing, bathes without assistance, family completes cooking, cleaning, driving  Comments has aide that comes 3 days a week for 4-6 hours  Communication  Communication No difficulties  Pain Assessment  Pain Assessment Faces  Faces Pain Scale 6  Pain Location lower back/sacral area  Pain Descriptors / Indicators Sore  Pain Intervention(s) Monitored during session  Cognition  Arousal/Alertness Awake/alert  Behavior During Therapy [facility] for tasks assessed/performed  Overall Cognitive Status Within Functional Limits for tasks assessed  Upper Extremity Assessment  Upper Extremity Assessment Overall WFL for tasks assessed (hx of CVA with R sided weakness; able to use functionally)  Lower Extremity Assessment  Lower Extremity Assessment Defer to PT evaluation  ADL  Overall ADL's  Needs assistance/impaired  Eating/Feeding Independent;Sitting  Grooming Wash/dry face;Wash/dry hands;Set up;Sitting  Upper Body Bathing Set up;Sitting  Lower Body Bathing Moderate assistance;Sitting/lateral leans;Sit to/from stand  Upper Body Dressing  Set up;Sitting  Lower Body Dressing Maximal assistance;Sitting/lateral leans;Sit to/from stand  Lower Body Dressing Details (indicate cue type and reason) difficulty donning slipper needing assist,  patient reports at baseline family/aide assist  with dressing due to back pain  Toilet Transfer Minimal assistance;Ambulation;RW  Toilet Transfer Details (indicate cue type and reason) patient needing increased time to ambulate to/from bathroom. x1 loss of balance while standing to urinate needing min A for safety  Toileting- Clothing Manipulation and Hygiene Minimal assistance;Sit to/from stand  Functional mobility during ADLs Minimal assistance;Rolling walker  General ADL Comments patient is high fall risk with multiple falls at home, patient reports related to pain in his lower back as the pain worsens it effects his gait/balance  Bed Mobility  Overal bed mobility Needs Assistance  Bed Mobility Rolling;Sidelying to Sit  Rolling Supervision  Sidelying to sit Supervision;HOB elevated  General bed mobility comments patient reports he uses the log roll technique at home due to back pain, supervision for safety  Transfers  Overall transfer level Needs assistance  Equipment used Rolling walker (2 wheeled)  Transfers Sit to/from Stand  Sit to Stand Min assist  General transfer comment min A to power up to standing and for safety with x1 loss of balance in bathroom  Balance  Overall balance assessment Needs assistance  Sitting-balance support Feet supported  Sitting balance-Leahy Scale Fair  Standing balance support During functional activity;Bilateral upper extremity supported  Standing balance-Leahy Scale Poor  Standing balance comment reliant on UE support  OT - End of Session  Equipment Utilized During Treatment Rolling walker  Activity Tolerance Patient tolerated treatment well  Patient left in chair;with call bell/phone within reach;with chair alarm set  Nurse Communication Mobility status  OT Assessment  OT Recommendation/Assessment Patient needs continued OT Services  OT Visit Diagnosis Pain;History of falling (Z91.81);Repeated falls (R29.6)  Pain - part of body  (back)  OT Problem List Pain;Decreased activity  tolerance;Impaired balance (sitting and/or standing);Decreased safety awareness  OT Plan  OT Frequency (ACUTE ONLY) Min 2X/week  OT Treatment/Interventions (ACUTE ONLY) Self-care/ADL training;Therapeutic activities;DME and/or AE instruction;Balance training;Patient/family education  AM-PAC OT "6 Clicks" Daily Activity Outcome Measure (Version 2)  Help from another person eating meals? 4  Help from another person taking care of personal grooming? 3  Help from another person toileting, which includes using toliet, bedpan, or urinal? 3  Help from another person bathing (including washing, rinsing, drying)? 2  Help from another person to put on and taking off regular upper body clothing? 3  Help from another person to put on and taking off regular lower body clothing? 2  6 Click Score 17  OT Recommendation  Follow Up Recommendations SNF  OT Equipment None recommended by OT  Individuals Consulted  Consulted and Agree with Results and Recommendations Patient  Acute Rehab OT Goals  Patient Stated Goal walk a reasonable amount to take care of myself, no falling  OT Goal Formulation With patient  Time For Goal Achievement 10/23/20  Potential to Achieve Goals Good  OT Time Calculation  OT Start Time (ACUTE ONLY) 0730  OT Stop Time (ACUTE ONLY) 0813  OT Time Calculation (min) 43 min  OT General Charges  $OT Visit 1 Visit  OT Evaluation  $OT Eval Low Complexity 1 Low  OT Treatments  $Self Care/Home Management  23-37 mins  Written Expression  Dominant Hand Right   Delbert Phenix OT OT pager: [phone]

## 2020-10-09 NOTE — Evaluation (Addendum)
Physical Therapy Evaluation Patient Details Name: [name] MRN: [id number] DOB: February 25, 1951 Today's Date: 10/09/2020   History of Present Illness  [name] is a 70 y.o. male presents due to frequent falls. Head CT, cervical spine CT, L shoulder x-ray, L hip x-ray and chest x-ray all negative for acute abnormalities. PMH: CKD s/p renal transplant 2017 on chronic immunosuppression, CAD s/p DES in 2014, stroke 2007, diabetes, OSA, and frequent falls due to chronic left sided weakness and diabetic polyneuropathy, CHF, MI 2002, prostate cancer, HTN    Clinical Impression  Pt admitted with above diagnosis. Pt reports falls almost daily at home, family assist him up, lives on 3rd floor with difficulty climbing stairs. Pt currently requires min A with limited ambulation in room using RW to steady, no knee buckling or falls, but is limited by pain complaints.Pt with narrow BOS, occasional brushing of feet to one another, difficulty sensing feet in space. Pt active with HHPT since January, but still falling often and would benefit from SNF for strengthening prior to return home with family to assist. Pt currently with functional limitations due to the deficits listed below (see PT Problem List). Pt will benefit from skilled PT to increase their independence and safety with mobility to allow discharge to the venue listed below.       Follow Up Recommendations SNF    Equipment Recommendations  None recommended by PT    Recommendations for Other Services       Precautions / Restrictions Precautions Precautions: Fall Restrictions Weight Bearing Restrictions: No      Mobility  Bed Mobility Overal bed mobility: Needs Assistance Bed Mobility: Sit to Supine  Sit to supine: Min assist   General bed mobility comments: min A to steady, pt faces bed head on and places R knee onto mattress before coming into sidelying, difficulty noting bil foot placement requesting to keep shoes on, min A to  steady with transfer    Transfers Overall transfer level: Needs assistance Equipment used: Rolling walker (2 wheeled) Transfers: Sit to/from Stand Sit to Stand: Min assist  General transfer comment: BUE assisting to power to stand, min A to power up  Ambulation/Gait Ambulation/Gait assistance: Min assist Gait Distance (Feet): 16 Feet Assistive device: Rolling walker (2 wheeled) Gait Pattern/deviations: Step-through pattern;Decreased stride length;Narrow base of support;Trunk flexed Gait velocity: decreased   General Gait Details: narrow BOS with occaisonal feet brushing against one another, no knee buckling or LOB, min A to steady, slow labored with ambulation  Stairs            Wheelchair Mobility    Modified Rankin (Stroke Patients Only)       Balance Overall balance assessment: Needs assistance  Standing balance support: During functional activity;Bilateral upper extremity supported Standing balance-Leahy Scale: Poor Standing balance comment: reliant on UE support        Pertinent Vitals/Pain Pain Assessment: 0-10 Pain Score: 7  Pain Location: bil feet and ankles, L hip Pain Descriptors / Indicators: Sore;Tender Pain Intervention(s): Limited activity within patient's tolerance;Monitored during session;Repositioned    Home Living Family/patient expects to be discharged to:: Private residence Living Arrangements: Children;Other relatives (daughter, stepson, grandson) Available Help at Discharge: Family;Available 24 hours/day (daughter works from home) Type of Home: House Home Access: Level entry     [place]: Grab bars - toilet;Grab bars - tub/shower;Shower seat;Cane - single point;Walker - 4 wheels (buying handrails for bed) Additional Comments: Drive into 1st floor (garage and bedroom), 2nd  level has foyer, 3rd level (kitchen/pt's bed/bath); pt enters home from back deck with bil handrails from ground up to 2nd level     Prior Function Level of Independence: Needs assistance   Gait / Transfers Assistance Needed: pt reports household ambulator with rollator, falls "just about" everyday and stepson assists with getting up  ADL's / [place] Assistance Needed: family assisting with LB dressing, bathes without assistance, family completes cooking, cleaning, driving  Comments: Pt reports active with HHPT since Jan, notices improvement but still falling a lot     Hand Dominance        Extremity/Trunk Assessment   Upper Extremity Assessment Upper Extremity Assessment: Defer to OT evaluation    Lower Extremity Assessment Lower Extremity Assessment: Generalized weakness;RLE deficits/detail;LLE deficits/detail RLE Deficits / Details: numbness circumferentially ankle and feet RLE Sensation: decreased light touch;decreased proprioception;history of peripheral neuropathy LLE Deficits / Details: numbness circumferentially distal to knee LLE Sensation: decreased light touch;decreased proprioception;history of peripheral neuropathy    Cervical / Trunk Assessment Cervical / Trunk Assessment: Kyphotic  Communication   Communication: No difficulties  Cognition Arousal/Alertness: Awake/alert Behavior During Therapy: WFL for tasks assessed/performed Overall Cognitive Status: Within Functional Limits for tasks assessed     General Comments      Exercises     Assessment/Plan    PT Assessment Patient needs continued PT services  PT Problem List Decreased strength;Decreased range of motion;Decreased activity tolerance;Decreased balance;Decreased mobility;Decreased coordination;Obesity;Pain;Impaired sensation;Decreased knowledge of use of DME       PT Treatment Interventions DME instruction;Gait training;Functional mobility training;Therapeutic activities;Therapeutic exercise;Balance training;Patient/family education;Neuromuscular re-education    PT Goals (Current goals can be found in the Care Plan  section)  Acute Rehab PT Goals Patient Stated Goal: walk a reasonable amount to take care of myself, no falling PT Goal Formulation: With patient Time For Goal Achievement: 10/23/20 Potential to Achieve Goals: Good    Frequency Min 2X/week   Barriers to discharge        Co-evaluation               AM-PAC PT "6 Clicks" Mobility  Outcome Measure Help needed turning from your back to your side while in a flat bed without using bedrails?: A Little Help needed moving from lying on your back to sitting on the side of a flat bed without using bedrails?: A Little Help needed moving to and from a bed to a chair (including a wheelchair)?: A Little Help needed standing up from a chair using your arms (e.g., wheelchair or bedside chair)?: A Little Help needed to walk in hospital room?: A Lot Help needed climbing 3-5 steps with a railing? : A Lot 6 Click Score: 16    End of Session Equipment Utilized During Treatment: Gait belt Activity Tolerance: Patient tolerated treatment well;Patient limited by pain Patient left: in bed;with call bell/phone within reach;with bed alarm set Nurse Communication: Mobility status;Patient requests pain meds PT Visit Diagnosis: Unsteadiness on feet (R26.81);Other abnormalities of gait and mobility (R26.89);Repeated falls (R29.6);Muscle weakness (generalized) (M62.81)    Time: 3267-1245 PT Time Calculation (min) (ACUTE ONLY): 27 min   Charges:   PT Evaluation $PT Eval Low Complexity: 1 Low PT Treatments $Therapeutic Activity: 8-22 mins         Tori Kimball Appleby PT, DPT 10/09/20, 11:40 AM

## 2020-10-09 NOTE — NC FL2 (Signed)
[place] LEVEL OF CARE SCREENING TOOL     IDENTIFICATION  Patient Name: [name] Birthdate: Oct 02, 1950 Sex: male Admission Date (Current Location): 10/08/2020  [facility] and [place] Number:  Herbalist and Address:  [facility],  [place] [address], [place]      Provider Number: [id number]  Attending Physician Name and Address:  Shawna Clamp, MD  Relative Name and Phone Number:       Current Level of Care: Hospital Recommended Level of Care: [place] Prior Approval Number:    Date Approved/Denied:   PASRR Number:    Discharge Plan: SNF    Current Diagnoses: Patient Active Problem List   Diagnosis Date Noted  . Acute kidney injury superimposed on chronic kidney disease ([place]) 10/08/2020  . Frequent falls 10/08/2020  . Stable angina ([place]) 09/16/2020  . Pre-operative clearance 06/20/2020  . Protrusion of lumbar intervertebral disc   . Malignant neoplasm of prostate ([place]) 06/09/2019  . PAD (peripheral artery disease) ([place]) 01/16/2019  . Spinal stenosis of lumbar region 09/21/2018  . Morbid obesity ([place]) 12/04/2017  . Mild cognitive impairment 06/18/2016  . Hyperlipidemia 01/16/2016  . Immunosuppressive management encounter following kidney transplant 07/12/2015  . Renal transplant recipient 06/27/2015  . Immunosuppression ([place]) 06/27/2015  . Thoracic aortic aneurysm ([place]) 10/23/2014  . GERD (gastroesophageal reflux disease) 05/16/2014  . Trigger thumb of right hand 01/03/2014  . Left foot drop 05/02/2013  . ED (erectile dysfunction) 04/03/2013  . H/O iron deficiency anemia 02/10/2012  . Risk for falls 01/22/2012  . Diabetic neuropathy ([place]) 10/14/2010  . Gout 12/22/2009  . Obstructive sleep apnea 07/10/2009  . CAD -s/p DES to marginal vessel at [facility] 2014 04/09/2009  . BPH (benign prostatic hyperplasia) 09/25/2008  . Gastroparesis 06/19/2008  . Low back pain 12/15/2007  . Posttraumatic stress  disorder 11/03/2007  . Diabetes mellitus due to underlying condition with diabetic autonomic (poly)neuropathy ([place]) 09/05/2007  . Allergic rhinitis 03/01/2007  . Essential hypertension 01/17/2007  . History of stroke 01/17/2007    Orientation RESPIRATION BLADDER Height & Weight     Self,Time,Situation,Place  Normal Incontinent Weight: 108 kg Height:  5\' 10"  (177.8 cm)  BEHAVIORAL SYMPTOMS/MOOD NEUROLOGICAL BOWEL NUTRITION STATUS      Continent Diet (Heart Healthy)  AMBULATORY STATUS COMMUNICATION OF NEEDS Skin   Extensive Assist Verbally Normal                       Personal Care Assistance Level of Assistance  Bathing,Dressing,Feeding Bathing Assistance: Limited assistance Feeding assistance: Independent Dressing Assistance: Limited assistance     Functional Limitations Info  Sight,Hearing,Speech Sight Info: Impaired (wears glasses) Hearing Info: Adequate Speech Info: Adequate    SPECIAL CARE FACTORS FREQUENCY  PT (By licensed PT),OT (By licensed OT)     PT Frequency: 5 x weekly OT Frequency: 5 x weekly            Contractures Contractures Info: Not present    Additional Factors Info  Code Status,Allergies Code Status Info: Full Allergies Info: Codeine           Current Medications (10/09/2020):  This is the current hospital active medication list Current Facility-Administered Medications  Medication Dose Route Frequency Provider Last Rate Last Admin  . acetaminophen (TYLENOL) tablet 650 mg  650 mg Oral Q6H PRN Lenore Cordia, MD   650 mg at 10/09/20 0402   Or  . acetaminophen (TYLENOL) suppository 650 mg  650 mg  Rectal Q6H PRN Lenore Cordia, MD      . aspirin EC tablet 81 mg  81 mg Oral QHS Zada Finders R, MD   81 mg at 10/08/20 2220  . atorvastatin (LIPITOR) tablet 40 mg  40 mg Oral Daily Lenore Cordia, MD   40 mg at 10/09/20 0816  . clopidogrel (PLAVIX) tablet 75 mg  75 mg Oral Daily Lenore Cordia, MD   75 mg at 10/09/20 0815  . feeding  supplement (ENSURE ENLIVE / ENSURE PLUS) liquid 237 mL  237 mL Oral BID BM Zada Finders R, MD   237 mL at 10/09/20 1031  . gabapentin (NEURONTIN) capsule 100 mg  100 mg Oral Q24H Zada Finders R, MD      . gabapentin (NEURONTIN) capsule 200 mg  200 mg Oral BID Zada Finders R, MD   200 mg at 10/09/20 0816  . heparin injection 5,000 Units  5,000 Units Subcutaneous Q8H Lenore Cordia, MD   5,000 Units at 10/09/20 [id number]  . insulin aspart (novoLOG) injection 0-9 Units  0-9 Units Subcutaneous TID WC Lenore Cordia, MD   1 Units at 10/09/20 0816  . insulin aspart protamine- aspart (NOVOLOG MIX 70/30) injection 50 Units  50 Units Subcutaneous BID WC Lenore Cordia, MD   50 Units at 10/09/20 0816  . mycophenolate (MYFORTIC) EC tablet 180 mg  180 mg Oral QHS Zada Finders R, MD   180 mg at 10/08/20 2213  . mycophenolate (MYFORTIC) EC tablet 360 mg  360 mg Oral Daily Lenore Cordia, MD   360 mg at 10/09/20 0820  . nortriptyline (PAMELOR) capsule 100 mg  100 mg Oral QHS Zada Finders R, MD   100 mg at 10/08/20 2213  . ondansetron (ZOFRAN) tablet 4 mg  4 mg Oral Q6H PRN Lenore Cordia, MD       Or  . ondansetron (ZOFRAN) injection 4 mg  4 mg Intravenous Q6H PRN Zada Finders R, MD      . senna-docusate (Senokot-S) tablet 1 tablet  1 tablet Oral QHS PRN Zada Finders R, MD      . tacrolimus (PROGRAF) capsule 5 mg  5 mg Oral BID Lenore Cordia, MD   5 mg at 10/09/20 1194     Discharge Medications: Please see discharge summary for a list of discharge medications.  Relevant Imaging Results:  Relevant Lab Results:   Additional Information Covid vaccine x 3 SS# [id number]  Luara Faye, Marjie Skiff, RN

## 2020-10-09 NOTE — TOC Progression Note (Signed)
Transition of Care [facility]) - Progression Note    Patient Details  Name: [name] MRN: [id number] Date of Birth: 09/22/1950  [facility]) CM/SW Contact  Arnelle Nale, Marjie Skiff, RN Phone Number: 10/09/2020, 1:05 PM  Clinical Narrative:     Spoke with pt and daughter at bedside for dc planning. Pt states that he really doesn't want to go to a nursing home but he needs some rehab and is not able to get up his steps at home. Permission given to fax out FL2 to area facilities. SNF auth started with [facility] Ref# [id number]. TOC will present pt and daughter with SNF bed offers when available.  Expected Discharge Plan: [place] Barriers to Discharge: Continued Medical Work up  Expected Discharge Plan and Services Expected Discharge Plan: [place] In-house Referral: Clinical Social Work Discharge Planning Services: CM Consult Post Acute Care Choice: [place] arrangements for the past 2 months: [place]                   DME Agency: Other - [place] ([place])      Social Determinants of Health (SDOH) Interventions    Readmission Risk Interventions No flowsheet data found.

## 2020-10-09 NOTE — Progress Notes (Signed)
PROGRESS NOTE    [name]  AST:[id number] DOB: 02/26/1951 DOA: 10/08/2020 PCP: [name], MD   Brief Narrative: This 70 years old male with PMH significant for CKD, s/p renal transplant in 2017 on chronic immunosuppression, CAD s/p DES in 2014, history of stroke, insulin dependent diabetes, OSA, frequent falls due to chronic left-sided weakness and diabetic polyneuropathy presented in the ED for the evaluation of falls at home.  CT head without contrast shows generalized cerebral atrophy, no intracranial abnormality.  Imaging,  CT C-spine, shoulder and left hip x-ray unremarkable. He is found to have acute kidney injury secondary to dehydration.  Renal functions improved , PT recommended SNF.   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  Continue IV fluid hydration Hold home Lasix.   Continue Myfortic and Prograf. A.m. labs shows improving serum creatinine.  Frequent falls: Chronic issue secondary to chronic left-sided weakness and significant diabetic polyneuropathy.    Did not have any significant injury on extensive imaging work-up.  Has been working with home health PT. -PT/OT-skilled nursing facility. -Continue gabapentin, nortriptyline  CAD s/p DES: Chronic and stable.   Continue aspirin, Plavix, atorvastatin.  History of CVA: Continue aspirin, Plavix, statin.  Insulin-dependent diabetes: Continue reduced home NovoLog 70/30 50 units twice daily with meals, sensitive SSI.  Thyroid nodule: 2.3 cm right thyroid nodule seen on CT imaging.   Had previous ultrasound imaging in 2017  which showed a right lobe nodule which do not appear suspicious and mildly suspicious appearing left lobe nodule.  Follow-up as outpatient.  OSA: Continue CPAP nightly.    DVT prophylaxis: Heparin Code Status:  Full code. Family Communication: Dtr at bed side. Disposition Plan:   Status is: Observation  The patient remains OBS appropriate and will d/c before 2 midnights.  Dispo: The patient is from: Home              Anticipated d/c is to: SNF              Patient currently is not medically stable to d/c.   Difficult to place patient No   Consultants:   None  Procedures  CT head.  CT C-spine Antimicrobials:   Anti-infectives (From admission, onward)   None      Subjective: Patient was seen and examined at bedside.  Overnight events noted.  Patient was sitting comfortably on the chair,  daughter was at bedside.  He denies any symptoms,  states he is feeling weak and want to be discharged to rehab.  Objective: Vitals:   10/09/20 0549 10/09/20 1030 10/09/20 1419 10/09/20 1419  BP: 139/86 140/84 122/78 122/78  Pulse: 94 96 88 88  Resp: 16 18 17 17   Temp: 98.6 F (37 C) 98.6 F (37 C) 98.1 F (36.7 C) 98.1 F (36.7 C)  TempSrc: Oral Oral Oral Oral  SpO2: 93% 94%  92%  Weight:      Height:        Intake/Output Summary (Last 24 hours) at 10/09/2020 1427 Last data filed at 10/09/2020 1300 Gross per 24 hour  Intake 2372.59 ml  Output 600 ml  Net 1772.59 ml   Filed Weights   10/08/20 1624  Weight: 108 kg    Examination:  General exam: Appears calm and comfortable, not in any acute distress. Respiratory system: Clear to auscultation. Respiratory effort normal. Cardiovascular system: S1 & S2 heard, RRR. No JVD, murmurs, rubs, gallops or clicks. No pedal edema. Gastrointestinal system: Abdomen is nondistended, soft and nontender. No organomegaly or masses felt. Normal bowel sounds heard. Central nervous system: Alert and oriented. No focal neurological  deficits. Extremities: Symmetric 5 x 5 power.  No edema, no cyanosis, no clubbing. Skin: No rashes, lesions or ulcers Psychiatry: Judgement and insight appear normal. Mood & affect appropriate.     Data Reviewed: I have personally reviewed following labs and imaging studies  CBC: Recent Labs  Lab 10/08/20 1709 10/09/20 0508  WBC 3.7* 3.8*  NEUTROABS 2.7  --   HGB 15.1 14.0  HCT 50.2 45.7  MCV 90.8 90.5  PLT 252 829   Basic Metabolic Panel: Recent Labs  Lab 10/08/20 1709 10/09/20 0508  NA 140 141  K 4.2 3.8  CL 96* 100  CO2 33* 32  GLUCOSE 148* 158*  BUN 25* 25*  CREATININE 2.08* 1.73*  CALCIUM 9.9 9.6   GFR: Estimated Creatinine Clearance: 48.2 mL/min (A) (by C-G formula based on SCr of 1.73 mg/dL (H)). Liver Function Tests: Recent Labs  Lab 10/08/20 1709  AST 17  ALT 11  ALKPHOS 84  BILITOT 1.2  PROT 7.9  ALBUMIN 4.1   No results for input(s): LIPASE, AMYLASE in the last 168 hours. No results for input(s): AMMONIA in the last 168 hours. Coagulation Profile: No results for input(s): INR, PROTIME in the last 168 hours. Cardiac Enzymes: Recent Labs  Lab 10/08/20 1709  CKTOTAL 98   BNP (last 3 results) No results for input(s): PROBNP in the last 8760 hours. HbA1C: No results for input(s): HGBA1C in the last 72 hours. CBG: Recent Labs  Lab 10/08/20 2154 10/09/20 0738 10/09/20 1132  GLUCAP 126* 138* 82   Lipid Profile: No results for input(s): CHOL, HDL, LDLCALC, TRIG, CHOLHDL, LDLDIRECT in the last 72 hours. Thyroid Function Tests: No results for input(s): TSH, T4TOTAL, FREET4, T3FREE, THYROIDAB in the last 72 hours. Anemia Panel: No results for input(s): VITAMINB12, FOLATE, FERRITIN, TIBC, IRON, RETICCTPCT in the last 72 hours. Sepsis Labs: No results for input(s): PROCALCITON, LATICACIDVEN in the last 168 hours.  Recent Results (from the past 240 hour(s))  Resp Panel by RT-PCR (Flu A&B, Covid) Nasopharyngeal Swab     Status: None    Collection Time: 10/08/20  7:12 PM   Specimen: Nasopharyngeal Swab; Nasopharyngeal(NP) swabs in vial transport medium  Result Value Ref Range Status   SARS Coronavirus 2 by RT PCR NEGATIVE NEGATIVE Final    Comment: (NOTE) SARS-CoV-2 target nucleic acids are NOT DETECTED.  The SARS-CoV-2 RNA is generally detectable in upper respiratory specimens during the acute phase of infection. The lowest concentration of SARS-CoV-2 viral copies this assay can detect is 138 copies/mL. A negative result does not preclude SARS-Cov-2 infection and should not be used as the sole basis for treatment or other patient management decisions. A negative result may occur with  improper specimen collection/handling, submission of specimen other than nasopharyngeal swab, presence of viral mutation(s) within the areas targeted by this assay, and inadequate number of viral copies(<138 copies/mL). A negative result must be combined with clinical observations, patient history, and epidemiological information. The expected result is Negative.  Fact Sheet for Patients:  EntrepreneurPulse.com.au  Fact Sheet for Healthcare Providers:  IncredibleEmployment.be  This test is no t yet approved or cleared by the [place] FDA and  has been authorized for detection and/or diagnosis of SARS-CoV-2 by FDA under an Emergency Use Authorization (EUA). This EUA will remain  in effect (meaning this test can be used) for the duration of the COVID-19 declaration under Section 564(b)(1) of the Act, 21 U.S.C.section 360bbb-3(b)(1), unless the authorization is terminated  or revoked sooner.       Influenza A by PCR NEGATIVE NEGATIVE Final   Influenza B by PCR NEGATIVE NEGATIVE Final    Comment: (NOTE) The Xpert Xpress SARS-CoV-2/FLU/RSV plus assay is intended as an aid in the diagnosis of influenza from Nasopharyngeal swab specimens and should not be used as a sole basis for treatment.  Nasal washings and aspirates are unacceptable for Xpert Xpress SARS-CoV-2/FLU/RSV testing.  Fact Sheet for Patients: EntrepreneurPulse.com.au  Fact Sheet for Healthcare Providers: IncredibleEmployment.be  This test is not yet approved or cleared by the [place] FDA and has been authorized for detection and/or diagnosis of SARS-CoV-2 by FDA under an Emergency Use Authorization (EUA). This EUA will remain in effect (meaning this test can be used) for the duration of the COVID-19 declaration under Section 564(b)(1) of the Act, 21 U.S.C. section 360bbb-3(b)(1), unless the authorization is terminated or revoked.  Performed at [facility], [place] [address], [place], [place] [address]     Radiology Studies: DG Ribs Unilateral W/Chest Left  Result Date: 10/08/2020 CLINICAL DATA:  fall EXAM: LEFT RIBS AND CHEST - 3+ VIEW COMPARISON:  CT chest May 16, 2020 FINDINGS: No displaced fracture or other bone lesions are seen involving the ribs. Degenerative change of the left AC joint and glenohumeral joint. There is no evidence of pneumothorax or pleural effusion. Both lungs are clear. Heart size and mediastinal contours are within normal limits. IMPRESSION: No acute cardiopulmonary findings. No displaced rib fracture or pneumothorax. Electronically Signed   By: Dahlia Bailiff MD   On: 10/08/2020 19:24   CT Head Wo Contrast  Result Date: 10/08/2020 CLINICAL DATA:  Status post fall. EXAM: CT HEAD WITHOUT CONTRAST TECHNIQUE: Contiguous axial images were obtained from the base of the skull through the vertex without intravenous contrast. COMPARISON:  August 13, 2020 FINDINGS: Brain: There is mild cerebral atrophy with widening of the extra-axial spaces and ventricular dilatation. There are areas of decreased attenuation within the white matter tracts of the supratentorial brain, consistent with microvascular disease changes. Vascular: No  hyperdense vessel or unexpected calcification. Skull: Normal. Negative for fracture or focal lesion. Sinuses/Orbits: There is mild bilateral maxillary sinus mucosal thickening. Other: None. IMPRESSION: 1. Generalized cerebral atrophy. 2. No acute intracranial abnormality. 3. Bilateral maxillary sinus disease. Electronically Signed   By: Virgina Norfolk M.D.   On: 10/08/2020 17:29   CT Cervical Spine Wo Contrast  Result Date: 10/08/2020 CLINICAL DATA:  Multiple recent falls. Neck pain radiating to shoulders EXAM: CT CERVICAL SPINE WITHOUT CONTRAST TECHNIQUE: Multidetector CT imaging of the cervical spine was performed without intravenous contrast. Multiplanar CT image reconstructions were also generated. COMPARISON:  None. FINDINGS: Alignment: No static subluxation. Facets are aligned. Occipital condyles and the lateral masses of C1 and C2 are normally approximated. Skull base and vertebrae: No acute fracture. Soft tissues and spinal canal: No prevertebral fluid or swelling. No visible canal hematoma. Disc levels: No advanced spinal canal or neural foraminal stenosis. Upper chest: No pneumothorax, pulmonary nodule or pleural effusion. Other: 2.3 cm right  thyroid nodule. IMPRESSION: 1. No acute fracture or static subluxation of the cervical spine. 2. 2.3 cm right thyroid nodule. Recommend thyroid US (ref: J Am Coll Radiol. 2015 Feb;12(2): 143-50). Electronically Signed   By: Ulyses Jarred M.D.   On: 10/08/2020 19:10   DG Shoulder Left  Result Date: 10/08/2020 CLINICAL DATA:  Recent fall with left shoulder pain, initial encounter EXAM: LEFT SHOULDER - 2+ VIEW COMPARISON:  None. FINDINGS: Degenerative changes of the acromioclavicular joint are noted. The humeral head is somewhat high-riding articulating with acromion which may be related to chronic underlying rotator cuff injury. Bony thorax is within normal limits. No acute fracture is seen. IMPRESSION: Degenerative change without acute abnormality.  Electronically Signed   By: Inez Catalina M.D.   On: 10/08/2020 19:17   DG Hip Unilat W or Wo Pelvis 2-3 Views Left  Result Date: 10/08/2020 CLINICAL DATA:  fall EXAM: DG HIP (WITH OR WITHOUT PELVIS) 2-3V LEFT COMPARISON:  CT pelvis June 09, 2019. FINDINGS: There is no evidence of hip fracture or dislocation. There is no evidence of arthropathy or other focal bone abnormality. Fiducial markers overlie the prostate. Vascular calcifications. IMPRESSION: No acute osseous abnormality. Electronically Signed   By: Dahlia Bailiff MD   On: 10/08/2020 19:21   Scheduled Meds: . aspirin EC  81 mg Oral QHS  . atorvastatin  40 mg Oral Daily  . clopidogrel  75 mg Oral Daily  . feeding supplement  237 mL Oral BID BM  . gabapentin  100 mg Oral Q24H  . gabapentin  200 mg Oral BID  . heparin  5,000 Units Subcutaneous Q8H  . insulin aspart  0-9 Units Subcutaneous TID WC  . insulin aspart protamine- aspart  50 Units Subcutaneous BID WC  . mycophenolate  180 mg Oral QHS  . mycophenolate  360 mg Oral Daily  . nortriptyline  100 mg Oral QHS  . tacrolimus  5 mg Oral BID   Continuous Infusions:   LOS: 0 days    Time spent: 35 mins    Sanders Manninen, MD [facility]   If 7PM-7AM, please contact night-coverage

## 2020-10-10 DIAGNOSIS — I639 Cerebral infarction, unspecified: Secondary | ICD-10-CM | POA: Diagnosis not present

## 2020-10-10 DIAGNOSIS — R296 Repeated falls: Secondary | ICD-10-CM | POA: Diagnosis not present

## 2020-10-10 DIAGNOSIS — R278 Other lack of coordination: Secondary | ICD-10-CM | POA: Diagnosis not present

## 2020-10-10 DIAGNOSIS — I2510 Atherosclerotic heart disease of native coronary artery without angina pectoris: Secondary | ICD-10-CM | POA: Diagnosis not present

## 2020-10-10 DIAGNOSIS — Z79899 Other long term (current) drug therapy: Secondary | ICD-10-CM | POA: Diagnosis not present

## 2020-10-10 DIAGNOSIS — T8610 Unspecified complication of kidney transplant: Secondary | ICD-10-CM | POA: Diagnosis not present

## 2020-10-10 DIAGNOSIS — I130 Hypertensive heart and chronic kidney disease with heart failure and stage 1 through stage 4 chronic kidney disease, or unspecified chronic kidney disease: Secondary | ICD-10-CM | POA: Diagnosis not present

## 2020-10-10 DIAGNOSIS — R2689 Other abnormalities of gait and mobility: Secondary | ICD-10-CM | POA: Diagnosis not present

## 2020-10-10 DIAGNOSIS — E0843 Diabetes mellitus due to underlying condition with diabetic autonomic (poly)neuropathy: Secondary | ICD-10-CM | POA: Diagnosis not present

## 2020-10-10 DIAGNOSIS — Z940 Kidney transplant status: Secondary | ICD-10-CM

## 2020-10-10 DIAGNOSIS — G629 Polyneuropathy, unspecified: Secondary | ICD-10-CM | POA: Diagnosis not present

## 2020-10-10 DIAGNOSIS — I509 Heart failure, unspecified: Secondary | ICD-10-CM | POA: Diagnosis not present

## 2020-10-10 DIAGNOSIS — E11649 Type 2 diabetes mellitus with hypoglycemia without coma: Secondary | ICD-10-CM | POA: Diagnosis not present

## 2020-10-10 DIAGNOSIS — R531 Weakness: Secondary | ICD-10-CM | POA: Diagnosis not present

## 2020-10-10 DIAGNOSIS — Z794 Long term (current) use of insulin: Secondary | ICD-10-CM | POA: Diagnosis not present

## 2020-10-10 DIAGNOSIS — N179 Acute kidney failure, unspecified: Secondary | ICD-10-CM | POA: Diagnosis not present

## 2020-10-10 DIAGNOSIS — R5381 Other malaise: Secondary | ICD-10-CM | POA: Diagnosis not present

## 2020-10-10 DIAGNOSIS — Z743 Need for continuous supervision: Secondary | ICD-10-CM | POA: Diagnosis not present

## 2020-10-10 DIAGNOSIS — G4733 Obstructive sleep apnea (adult) (pediatric): Secondary | ICD-10-CM | POA: Diagnosis not present

## 2020-10-10 DIAGNOSIS — M6281 Muscle weakness (generalized): Secondary | ICD-10-CM | POA: Diagnosis not present

## 2020-10-10 DIAGNOSIS — R2681 Unsteadiness on feet: Secondary | ICD-10-CM | POA: Diagnosis not present

## 2020-10-10 DIAGNOSIS — N1831 Chronic kidney disease, stage 3a: Secondary | ICD-10-CM | POA: Diagnosis not present

## 2020-10-10 DIAGNOSIS — E1122 Type 2 diabetes mellitus with diabetic chronic kidney disease: Secondary | ICD-10-CM | POA: Diagnosis not present

## 2020-10-10 DIAGNOSIS — Z7401 Bed confinement status: Secondary | ICD-10-CM | POA: Diagnosis not present

## 2020-10-10 DIAGNOSIS — W19XXXD Unspecified fall, subsequent encounter: Secondary | ICD-10-CM | POA: Diagnosis not present

## 2020-10-10 DIAGNOSIS — E1140 Type 2 diabetes mellitus with diabetic neuropathy, unspecified: Secondary | ICD-10-CM | POA: Diagnosis not present

## 2020-10-10 DIAGNOSIS — N189 Chronic kidney disease, unspecified: Secondary | ICD-10-CM | POA: Diagnosis not present

## 2020-10-10 DIAGNOSIS — Z7982 Long term (current) use of aspirin: Secondary | ICD-10-CM | POA: Diagnosis not present

## 2020-10-10 DIAGNOSIS — Z20822 Contact with and (suspected) exposure to covid-19: Secondary | ICD-10-CM | POA: Diagnosis not present

## 2020-10-10 LAB — BASIC METABOLIC PANEL
Anion gap: 6 (ref 5–15)
BUN: 23 mg/dL (ref 8–23)
CO2: 29 mmol/L (ref 22–32)
Calcium: 9.1 mg/dL (ref 8.9–10.3)
Chloride: 102 mmol/L (ref 98–111)
Creatinine, Ser: 1.64 mg/dL — ABNORMAL HIGH (ref 0.61–1.24)
GFR, Estimated: 44 mL/min — ABNORMAL LOW (ref >60)
Glucose, Bld: 102 mg/dL — ABNORMAL HIGH (ref 70–99)
Potassium: 3.5 mmol/L (ref 3.5–5.1)
Sodium: 137 mmol/L (ref 135–145)

## 2020-10-10 LAB — GLUCOSE, CAPILLARY
Glucose-Capillary: 100 mg/dL — ABNORMAL HIGH (ref 70–99)
Glucose-Capillary: 121 mg/dL — ABNORMAL HIGH (ref 70–99)
Glucose-Capillary: 131 mg/dL — ABNORMAL HIGH (ref 70–99)
Glucose-Capillary: 44 mg/dL — CL (ref 70–99)
Glucose-Capillary: 44 mg/dL — CL (ref 70–99)
Glucose-Capillary: 47 mg/dL — ABNORMAL LOW (ref 70–99)
Glucose-Capillary: 55 mg/dL — ABNORMAL LOW (ref 70–99)
Glucose-Capillary: 56 mg/dL — ABNORMAL LOW (ref 70–99)
Glucose-Capillary: 71 mg/dL (ref 70–99)

## 2020-10-10 LAB — CBC
HCT: 41.3 % (ref 39.0–52.0)
Hemoglobin: 12.3 g/dL — ABNORMAL LOW (ref 13.0–17.0)
MCH: 27.2 pg (ref 26.0–34.0)
MCHC: 29.8 g/dL — ABNORMAL LOW (ref 30.0–36.0)
MCV: 91.4 fL (ref 80.0–100.0)
Platelets: 201 10*3/uL (ref 150–400)
RBC: 4.52 MIL/uL (ref 4.22–5.81)
RDW: 14.8 % (ref 11.5–15.5)
WBC: 2.5 10*3/uL — ABNORMAL LOW (ref 4.0–10.5)
nRBC: 0 % (ref 0.0–0.2)

## 2020-10-10 LAB — MAGNESIUM: Magnesium: 1.8 mg/dL (ref 1.7–2.4)

## 2020-10-10 LAB — PHOSPHORUS: Phosphorus: 3.7 mg/dL (ref 2.5–4.6)

## 2020-10-10 MED ORDER — INSULIN ASPART PROT & ASPART (70-30 MIX) 100 UNIT/ML ~~LOC~~ SUSP
35.0000 [IU] | Freq: Two times a day (BID) | SUBCUTANEOUS | Status: DC
  Filled 2020-10-10: qty 10

## 2020-10-10 MED ORDER — INSULIN ASPART PROT & ASPART (70-30 MIX) 100 UNIT/ML ~~LOC~~ SUSP: 35.0000 [IU] | Freq: Two times a day (BID) | SUBCUTANEOUS | 11 refills | Status: DC

## 2020-10-10 NOTE — TOC Transition Note (Signed)
Transition of Care [facility]) - CM/SW Discharge Note   Patient Details  Name: MAHMUD KEITHLY MRN: [id number] Date of Birth: Mar 26, 1951  Transition of Care [facility]) CM/SW Contact:  Lynnell Catalan, RN Phone Number: 10/10/2020, 12:45 PM   Clinical Narrative:    SNF bed offers given to pt and [facility] was chosen. [facility] liaison contacted to secure bed.   Auth received from [facility] # [id number]. PASRR# [id number]  Pt to DC to facility via [facility]. RN to call report to [phone]. He will go to room 140.    Barriers to Discharge: Continued Medical Work up   Patient Goals and CMS Choice Patient states their goals for this hospitalization and ongoing recovery are:: To get moving better CMS Medicare.gov Compare Post Acute Care list provided to:: Patient Choice offered to / list presented to : Patient  Discharge Plan and Services In-house Referral: Clinical Social Work Discharge Planning Services: CM Consult Post Acute Care Choice: Home Health            DME Agency: Other - [place] ([place])       [place] Arranged: RN,PT [facility] Agency: [place] Date [place]: 10/08/20 Time [place]: 1423 Representative spoke with at [place]: [place] ([place]) Interventions     Readmission Risk Interventions No flowsheet data found.

## 2020-10-10 NOTE — Progress Notes (Signed)
Pt discharged to [facility] in stable condition. Attempted to call report. Left on hold. Discharged to facility via [place].

## 2020-10-10 NOTE — Discharge Summary (Signed)
Physician Discharge Summary  [name] [id number] DOB: 02-03-1951 DOA: 10/08/2020  PCP: [name], MD  Admit date: 10/08/2020 Discharge date: 10/10/2020  Admitted From: Home  Disposition:  SNF   Recommendations for Outpatient Follow-up and new medication changes:  1. Follow up with Dr. Yong Channel in 7 to 10 days.  2. Decreased dosed of insulin from 75 to 35 units of Insulin 70/30 to prevent hypoglycemia. 3. Follow up renal function as outpatient. 4. Follow-up on 2.3 cm right thyroid nodule with ultrasonography as an outpatient.  Home Health: na   Equipment/Devices: na    Discharge Condition: stable  CODE STATUS: full  Diet recommendation: heart healthy and diabetic prudent.   Brief/Interim Summary: [name] was admitted to the hospital working diagnosis of acute kidney injury and chronic kidney disease stage IIIa.  70 year old male with past medical history for chronic kidney disease status post renal transplant 2017, on chronic immunosuppression, coronary artery disease status post drug-eluting stent 2014, type 2 diabetes mellitus, OSA and history of stroke who presented with frequent falls.  He is known to have polyneuropathy, and recently noticed worsening difficulty ambulating that provoked frequent falls.  Ambulatory dysfunction persisting despite outpatient physical therapy.  On his initial physical examination blood pressure 142/90, heart rate 97, respiratory rate 19, temperature 98.0, oxygen saturation 99%.  His lungs are clear to auscultation bilaterally, heart S1-S2, present, rhythmic, abdomen soft nontender, trace bilateral extremity edema.  Patient had decreased strength on his left side, more lower than upper extremity.   Sodium 140, potassium 4.2, chloride 96, bicarb 33, glucose 148, BUN 25, creatinine 2.0, white count 3.7, hemoglobin 15.9, hematocrit 50.2, platelets 252. SARS COVID-19 negative. Urinalysis specific gravity 1.011, negative protein, negative  nitrates. Head CT with generalized cerebral atrophy, no acute intracranial normalities. Neck CT with no acute changes, incidental 2.3 cm right thyroid nodule.  Radiograph hip, ribs and shoulder negative for fractures.  EKG 89 bpm, left axis deviation, normal intervals, sinus rhythm, no ST segment changes, negative T wave lead I/aVL.  1.  Acute kidney injury on chronic kidney disease stage IIIa, status post renal transplant 2017. Patient received supportive medical therapy with intravenous fluids with good toleration.  Diuretic therapy was held. Discharge creatinine 1.64, BUN 23.  Sodium 137, potassium 3.5, chloride 102, bicarb 29. (base creatining 1.5)  He will continue taking mycophenolate and tacrolimus. Follow-up kidney function as an outpatient.  2.  Ambulatory dysfunction.  Patient with diabetic neuropathy, ambulatory dysfunction with frequent falls. Patient was evaluated by PT/OT, recommendations to continue therapy at a skilled nursing facility. Continue gabapentin and nortriptyline.  3.  Coronary artery disease status post angioplasty 2014.  Patient remained chest pain-free, continue aspirin, clopidogrel and atorvastatin.  4.  History of CVA.  Continue blood pressure control, aspirin, clopidogrel and statin.  5.  Type 2 diabetes mellitus, hypoglycemia.  Patient had episodic hypoglycemia, dose of insulin was reduced to 35 units of insulin 70/30, follow-up as an outpatient.  6.  Incidental 2.3 cm right thyroid nodule.  Old records showed 2017 thyroid ultrasound, right nodule no suspicious for malignancy, left nodule mildly suspicious. Follow-up as an outpatient.  7.  Obesity class I, obstructive sleep apnea.  Calculated BMI 34.1, continue CPAP at night.  8. Hx of gout. No acute flare, continue with allopurinol.    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   AMBULATORY NON FORMULARY MEDICATION Ankle Foot Orthosis  Foot Drop M21.372 Disp 1   AMBULATORY NON FORMULARY MEDICATION Rolling Walker.  Foot drop M21.372   aspirin 81 MG tablet Take 81 mg by mouth at bedtime.   atorvastatin 40 MG tablet Commonly known as: LIPITOR Take 40 mg by mouth daily.   clopidogrel 75 MG tablet Commonly known as: PLAVIX Take 75 mg by mouth daily.   diclofenac Sodium 1 % Gel Commonly known as: VOLTAREN Apply 2 g topically 4 (four) times daily. What changed:   when to take this  reasons to take this   gabapentin 300 MG capsule Commonly known as: NEURONTIN TAKE 2 CAPSULES IN MORNING, 1 CAPSULE IN AFTERNOON, AND 2 CAPSULES AT NIGHT. What changed: See the new instructions.   hydrocortisone cream 1 % Apply 1 application topically 4 (four) times daily as needed for itching.   insulin aspart protamine- aspart (70-30) 100 UNIT/ML injection Commonly known as: NOVOLOG MIX 70/30 Inject 0.35 mLs (35 Units total) into the skin 2 (two) times daily with a meal. What changed: how much to take   Insulin Syringe-Needle U-100 31G X 5/16" 1 ML Misc Commonly known as: B-D INS SYR ULTRAFINE 1CC/31G Used to give insulin injections twice daily.   Magnesium 200 MG Tabs Take 200 mg by mouth every evening.   mycophenolate 180 MG EC tablet Commonly known as: MYFORTIC Take 180-360 mg by mouth See admin instructions. Take 2 tablets (360mg ) by mouth in the morning and 1 tablet (180mg ) by mouth at night.   nitroGLYCERIN 0.4 MG SL tablet Commonly known as:  NITROSTAT PLACE 1 TABLET (0.4 MG TOTAL) UNDER THE TONGUE EVERY 5 (FIVE) MINUTES AS NEEDED FOR CHEST PAIN.   nortriptyline 50 MG capsule Commonly known as: PAMELOR TAKE 2 CAPSULES (100 MG TOTAL) BY MOUTH AT BEDTIME.   omeprazole 20 MG capsule Commonly known as: PRILOSEC TAKE 1 CAPSULE (20 MG TOTAL) BY MOUTH 2 (TWO) TIMES DAILY BEFORE A MEAL.   Ozempic (0.25 or 0.5 MG/DOSE) 2 MG/1.5ML Sopn Generic drug: Semaglutide(0.25 or 0.5MG /DOS) Inject 1 mg into the skin 2 (two) times a week. Monday and Friday   tacrolimus 5 MG capsule Commonly known as: PROGRAF Take 5 mg by mouth 2 (two) times daily.   tiZANidine 2 MG tablet Commonly known as: ZANAFLEX Take 1 tablet (2 mg total) by mouth at bedtime. What changed: when to take this   Vitamin D (Ergocalciferol) 1.25 MG (50000 UNIT) Caps capsule Commonly known as: DRISDOL Take 50,000 Units by mouth 2 (two) times a week.       Allergies  Allergen Reactions  . Codeine        Procedures/Studies: DG Ribs Unilateral W/Chest Left  Result Date: 10/08/2020 CLINICAL DATA:  fall EXAM: LEFT RIBS AND CHEST - 3+ VIEW COMPARISON:  CT chest May 16, 2020 FINDINGS: No displaced fracture or other bone lesions are seen involving the ribs. Degenerative change of the left AC joint and glenohumeral joint. There is no evidence of pneumothorax or pleural effusion. Both lungs are clear.  Heart size and mediastinal contours are within normal limits. IMPRESSION: No acute cardiopulmonary findings. No displaced rib fracture or pneumothorax. Electronically Signed   By: Dahlia Bailiff MD   On: 10/08/2020 19:24   CT Head Wo Contrast  Result Date: 10/08/2020 CLINICAL DATA:  Status post fall. EXAM: CT HEAD WITHOUT CONTRAST TECHNIQUE: Contiguous axial images were obtained from the base of the skull through the vertex without intravenous contrast. COMPARISON:  August 13, 2020 FINDINGS: Brain: There is mild cerebral atrophy with widening of the extra-axial spaces and  ventricular dilatation. There are areas of decreased attenuation within the white matter tracts of the supratentorial brain, consistent with microvascular disease changes. Vascular: No hyperdense vessel or unexpected calcification. Skull: Normal. Negative for fracture or focal lesion. Sinuses/Orbits: There is mild bilateral maxillary sinus mucosal thickening. Other: None. IMPRESSION: 1. Generalized cerebral atrophy. 2. No acute intracranial abnormality. 3. Bilateral maxillary sinus disease. Electronically Signed   By: Virgina Norfolk M.D.   On: 10/08/2020 17:29   CT Cervical Spine Wo Contrast  Result Date: 10/08/2020 CLINICAL DATA:  Multiple recent falls. Neck pain radiating to shoulders EXAM: CT CERVICAL SPINE WITHOUT CONTRAST TECHNIQUE: Multidetector CT imaging of the cervical spine was performed without intravenous contrast. Multiplanar CT image reconstructions were also generated. COMPARISON:  None. FINDINGS: Alignment: No static subluxation. Facets are aligned. Occipital condyles and the lateral masses of C1 and C2 are normally approximated. Skull base and vertebrae: No acute fracture. Soft tissues and spinal canal: No prevertebral fluid or swelling. No visible canal hematoma. Disc levels: No advanced spinal canal or neural foraminal stenosis. Upper chest: No pneumothorax, pulmonary nodule or pleural effusion. Other: 2.3 cm right thyroid nodule. IMPRESSION: 1. No acute fracture or static subluxation of the cervical spine. 2. 2.3 cm right thyroid nodule. Recommend thyroid US (ref: J Am Coll Radiol. 2015 Feb;12(2): 143-50). Electronically Signed   By: Ulyses Jarred M.D.   On: 10/08/2020 19:10   DG Shoulder Left  Result Date: 10/08/2020 CLINICAL DATA:  Recent fall with left shoulder pain, initial encounter EXAM: LEFT SHOULDER - 2+ VIEW COMPARISON:  None. FINDINGS: Degenerative changes of the acromioclavicular joint are noted. The humeral head is somewhat high-riding articulating with acromion which may  be related to chronic underlying rotator cuff injury. Bony thorax is within normal limits. No acute fracture is seen. IMPRESSION: Degenerative change without acute abnormality. Electronically Signed   By: Inez Catalina M.D.   On: 10/08/2020 19:17   DG Hip Unilat W or Wo Pelvis 2-3 Views Left  Result Date: 10/08/2020 CLINICAL DATA:  fall EXAM: DG HIP (WITH OR WITHOUT PELVIS) 2-3V LEFT COMPARISON:  CT pelvis June 09, 2019. FINDINGS: There is no evidence of hip fracture or dislocation. There is no evidence of arthropathy or other focal bone abnormality. Fiducial markers overlie the prostate. Vascular calcifications. IMPRESSION: No acute osseous abnormality. Electronically Signed   By: Dahlia Bailiff MD   On: 10/08/2020 19:21       Subjective: Patient is feeling better, no nausea or vomiting, no chest pain or dyspnea, tolerating po well.   Discharge Exam: Vitals:   10/09/20 2000 10/10/20 0507  BP: (!) 128/92 113/72  Pulse: 87 73  Resp: 19 16  Temp: 97.8 F (36.6 C) 97.6 F (36.4 C)  SpO2: 93% 94%   Vitals:   10/09/20 1419 10/09/20 1419 10/09/20 2000 10/10/20 0507  BP: 122/78 122/78 (!) 128/92 113/72  Pulse: 88 88 87 73  Resp: 17 17 19 16   Temp: 98.1 F (  36.7 C) 98.1 F (36.7 C) 97.8 F (36.6 C) 97.6 F (36.4 C)  TempSrc: Oral Oral Oral Oral  SpO2:  92% 93% 94%  Weight:      Height:        General: Not in pain or dyspnea.  Neurology: Awake and alert, non focal  E ENT: no pallor, no icterus, oral mucosa moist Cardiovascular: No JVD. S1-S2 present, rhythmic, no gallops, rubs, or murmurs. No lower extremity edema. Pulmonary: positive breath sounds bilaterally, adequate air movement, no wheezing, rhonchi or rales. Gastrointestinal. Abdomen soft and non tender Skin. No rashes Musculoskeletal: no joint deformities   The results of significant diagnostics from this hospitalization (including imaging, microbiology, ancillary and laboratory) are listed below for reference.      Microbiology: Recent Results (from the past 240 hour(s))  Resp Panel by RT-PCR (Flu A&B, Covid) Nasopharyngeal Swab     Status: None   Collection Time: 10/08/20  7:12 PM   Specimen: Nasopharyngeal Swab; Nasopharyngeal(NP) swabs in vial transport medium  Result Value Ref Range Status   SARS Coronavirus 2 by RT PCR NEGATIVE NEGATIVE Final    Comment: (NOTE) SARS-CoV-2 target nucleic acids are NOT DETECTED.  The SARS-CoV-2 RNA is generally detectable in upper respiratory specimens during the acute phase of infection. The lowest concentration of SARS-CoV-2 viral copies this assay can detect is 138 copies/mL. A negative result does not preclude SARS-Cov-2 infection and should not be used as the sole basis for treatment or other patient management decisions. A negative result may occur with  improper specimen collection/handling, submission of specimen other than nasopharyngeal swab, presence of viral mutation(s) within the areas targeted by this assay, and inadequate number of viral copies(<138 copies/mL). A negative result must be combined with clinical observations, patient history, and epidemiological information. The expected result is Negative.  Fact Sheet for Patients:  EntrepreneurPulse.com.au  Fact Sheet for Healthcare Providers:  IncredibleEmployment.be  This test is no t yet approved or cleared by the [place] FDA and  has been authorized for detection and/or diagnosis of SARS-CoV-2 by FDA under an Emergency Use Authorization (EUA). This EUA will remain  in effect (meaning this test can be used) for the duration of the COVID-19 declaration under Section 564(b)(1) of the Act, 21 U.S.C.section 360bbb-3(b)(1), unless the authorization is terminated  or revoked sooner.       Influenza A by PCR NEGATIVE NEGATIVE Final   Influenza B by PCR NEGATIVE NEGATIVE Final    Comment: (NOTE) The Xpert Xpress SARS-CoV-2/FLU/RSV plus assay is  intended as an aid in the diagnosis of influenza from Nasopharyngeal swab specimens and should not be used as a sole basis for treatment. Nasal washings and aspirates are unacceptable for Xpert Xpress SARS-CoV-2/FLU/RSV testing.  Fact Sheet for Patients: EntrepreneurPulse.com.au  Fact Sheet for Healthcare Providers: IncredibleEmployment.be  This test is not yet approved or cleared by the [place] FDA and has been authorized for detection and/or diagnosis of SARS-CoV-2 by FDA under an Emergency Use Authorization (EUA). This EUA will remain in effect (meaning this test can be used) for the duration of the COVID-19 declaration under Section 564(b)(1) of the Act, 21 U.S.C. section 360bbb-3(b)(1), unless the authorization is terminated or revoked.  Performed at [facility], [place] [address], [place], [place] [address]      Labs: BNP (last 3 results) No results for input(s): BNP in the last 8760 hours. Basic Metabolic Panel: Recent Labs  Lab 10/08/20 1709 10/09/20 0508 10/10/20 0528  NA 140  141 137  K 4.2 3.8 3.5  CL 96* 100 102  CO2 33* 32 29  GLUCOSE 148* 158* 102*  BUN 25* 25* 23  CREATININE 2.08* 1.73* 1.64*  CALCIUM 9.9 9.6 9.1  MG  --   --  1.8  PHOS  --   --  3.7   Liver Function Tests: Recent Labs  Lab 10/08/20 1709  AST 17  ALT 11  ALKPHOS 84  BILITOT 1.2  PROT 7.9  ALBUMIN 4.1   No results for input(s): LIPASE, AMYLASE in the last 168 hours. No results for input(s): AMMONIA in the last 168 hours. CBC: Recent Labs  Lab 10/08/20 1709 10/09/20 0508 10/10/20 0528  WBC 3.7* 3.8* 2.5*  NEUTROABS 2.7  --   --   HGB 15.1 14.0 12.3*  HCT 50.2 45.7 41.3  MCV 90.8 90.5 91.4  PLT 252 242 201   Cardiac Enzymes: Recent Labs  Lab 10/08/20 1709  CKTOTAL 98   BNP: Invalid input(s): POCBNP CBG: Recent Labs  Lab 10/10/20 0004 10/10/20 0034 10/10/20 0135 10/10/20 0511 10/10/20 0707  GLUCAP  44* 55* 71 121* 131*   D-Dimer No results for input(s): DDIMER in the last 72 hours. Hgb A1c No results for input(s): HGBA1C in the last 72 hours. Lipid Profile No results for input(s): CHOL, HDL, LDLCALC, TRIG, CHOLHDL, LDLDIRECT in the last 72 hours. Thyroid function studies No results for input(s): TSH, T4TOTAL, T3FREE, THYROIDAB in the last 72 hours.  Invalid input(s): FREET3 Anemia work up No results for input(s): VITAMINB12, FOLATE, FERRITIN, TIBC, IRON, RETICCTPCT in the last 72 hours. Urinalysis    Component Value Date/Time   COLORURINE YELLOW 10/08/2020 1820   APPEARANCEUR CLEAR 10/08/2020 1820   LABSPEC 1.011 10/08/2020 1820   PHURINE 8.0 10/08/2020 1820   GLUCOSEU NEGATIVE 10/08/2020 1820   HGBUR NEGATIVE 10/08/2020 1820   BILIRUBINUR NEGATIVE 10/08/2020 1820   KETONESUR NEGATIVE 10/08/2020 1820   PROTEINUR NEGATIVE 10/08/2020 1820   UROBILINOGEN 0.2 01/15/2015 1642   NITRITE NEGATIVE 10/08/2020 1820   LEUKOCYTESUR NEGATIVE 10/08/2020 1820   Sepsis Labs Invalid input(s): PROCALCITONIN,  WBC,  LACTICIDVEN Microbiology Recent Results (from the past 240 hour(s))  Resp Panel by RT-PCR (Flu A&B, Covid) Nasopharyngeal Swab     Status: None   Collection Time: 10/08/20  7:12 PM   Specimen: Nasopharyngeal Swab; Nasopharyngeal(NP) swabs in vial transport medium  Result Value Ref Range Status   SARS Coronavirus 2 by RT PCR NEGATIVE NEGATIVE Final    Comment: (NOTE) SARS-CoV-2 target nucleic acids are NOT DETECTED.  The SARS-CoV-2 RNA is generally detectable in upper respiratory specimens during the acute phase of infection. The lowest concentration of SARS-CoV-2 viral copies this assay can detect is 138 copies/mL. A negative result does not preclude SARS-Cov-2 infection and should not be used as the sole basis for treatment or other patient management decisions. A negative result may occur with  improper specimen collection/handling, submission of specimen other than  nasopharyngeal swab, presence of viral mutation(s) within the areas targeted by this assay, and inadequate number of viral copies(<138 copies/mL). A negative result must be combined with clinical observations, patient history, and epidemiological information. The expected result is Negative.  Fact Sheet for Patients:  EntrepreneurPulse.com.au  Fact Sheet for Healthcare Providers:  IncredibleEmployment.be  This test is no t yet approved or cleared by the [place] FDA and  has been authorized for detection and/or diagnosis of SARS-CoV-2 by FDA under an Emergency Use Authorization (EUA). This EUA will remain  in effect (meaning this test can be used) for the duration of the COVID-19 declaration under Section 564(b)(1) of the Act, 21 U.S.C.section 360bbb-3(b)(1), unless the authorization is terminated  or revoked sooner.       Influenza A by PCR NEGATIVE NEGATIVE Final   Influenza B by PCR NEGATIVE NEGATIVE Final    Comment: (NOTE) The Xpert Xpress SARS-CoV-2/FLU/RSV plus assay is intended as an aid in the diagnosis of influenza from Nasopharyngeal swab specimens and should not be used as a sole basis for treatment. Nasal washings and aspirates are unacceptable for Xpert Xpress SARS-CoV-2/FLU/RSV testing.  Fact Sheet for Patients: EntrepreneurPulse.com.au  Fact Sheet for Healthcare Providers: IncredibleEmployment.be  This test is not yet approved or cleared by the [place] FDA and has been authorized for detection and/or diagnosis of SARS-CoV-2 by FDA under an Emergency Use Authorization (EUA). This EUA will remain in effect (meaning this test can be used) for the duration of the COVID-19 declaration under Section 564(b)(1) of the Act, 21 U.S.C. section 360bbb-3(b)(1), unless the authorization is terminated or revoked.  Performed at [facility], [place] [address], [place], [place] [address]      Time coordinating discharge: 45 minutes  SIGNED:   Tawni Millers, MD  [facility] 10/10/2020, 10:31 AM

## 2020-10-10 NOTE — Telephone Encounter (Signed)
Can you look into this please

## 2020-10-14 DIAGNOSIS — Z794 Long term (current) use of insulin: Secondary | ICD-10-CM | POA: Diagnosis not present

## 2020-10-14 DIAGNOSIS — I639 Cerebral infarction, unspecified: Secondary | ICD-10-CM | POA: Diagnosis not present

## 2020-10-14 DIAGNOSIS — G629 Polyneuropathy, unspecified: Secondary | ICD-10-CM | POA: Diagnosis not present

## 2020-10-14 DIAGNOSIS — Z79899 Other long term (current) drug therapy: Secondary | ICD-10-CM | POA: Diagnosis not present

## 2020-10-14 DIAGNOSIS — R296 Repeated falls: Secondary | ICD-10-CM | POA: Diagnosis not present

## 2020-10-14 DIAGNOSIS — T8610 Unspecified complication of kidney transplant: Secondary | ICD-10-CM | POA: Diagnosis not present

## 2020-10-14 DIAGNOSIS — E11649 Type 2 diabetes mellitus with hypoglycemia without coma: Secondary | ICD-10-CM | POA: Diagnosis not present

## 2020-10-15 DIAGNOSIS — R5381 Other malaise: Secondary | ICD-10-CM | POA: Diagnosis not present

## 2020-10-15 NOTE — Progress Notes (Deleted)
HPI: Follow-up coronary artery disease, thoracic aortic aneurysm.  Patient is status post drug-eluting stent to his obtuse marginal in 2014.  Dopplers July 2019 showed 2 areas of 50 to 74% stenosis in the proximal and proximal to mid SFA and left distal ATA severely diminished.  Echocardiogram July 2021 showed ejection fraction greater than 75%, severe left ventricular hypertrophy, grade 1 diastolic dysfunction, trace aortic insufficiency and moderately dilated ascending aorta at 48 mm.  Monitor December 2021 showed sinus rhythm with rare PVC.  Chest CT December 2021 showed 4.8 cm thoracic aortic aneurysm.  There was also note of pulmonary nodules and follow-up was recommended in 3 to 6 months.  Most recent nuclear study April 2022 showed ejection fraction 47% and possible ischemia in the basal inferolateral and mid inferolateral wall.  Current Outpatient Medications  Medication Sig Dispense Refill  . allopurinol (ZYLOPRIM) 100 MG tablet Take 200 mg by mouth daily.    . AMBULATORY NON FORMULARY MEDICATION Ankle Foot Orthosis  Foot Drop M21.372 Disp 1 1 each 0  . AMBULATORY NON FORMULARY MEDICATION Rolling Walker.  Foot drop M21.372 1 each 0  . aspirin 81 MG tablet Take 81 mg by mouth at bedtime.    [name] atorvastatin (LIPITOR) 40 MG tablet Take 40 mg by mouth daily.    . clopidogrel (PLAVIX) 75 MG tablet Take 75 mg by mouth daily.    . diclofenac Sodium (VOLTAREN) 1 % GEL Apply 2 g topically 4 (four) times daily. (Patient taking differently: Apply 2 g topically 4 (four) times daily as needed (pain).) 50 g 0  . gabapentin (NEURONTIN) 300 MG capsule TAKE 2 CAPSULES IN MORNING, 1 CAPSULE IN AFTERNOON, AND 2 CAPSULES AT NIGHT. (Patient taking differently: Take 300-600 mg by mouth See admin instructions. Take 600mg  in morning, 300mg  in afternoon, and 600mg  at night.) 150 capsule 0  . hydrocortisone cream 1 % Apply 1 application topically 4 (four) times daily as needed for itching.    . insulin  aspart protamine- aspart (NOVOLOG MIX 70/30) (70-30) 100 UNIT/ML injection Inject 0.35 mLs (35 Units total) into the skin 2 (two) times daily with a meal. 10 mL 11  . Insulin Syringe-Needle U-100 (B-D INS SYR ULTRAFINE 1CC/31G) 31G X 5/16" 1 ML MISC Used to give insulin injections twice daily. 100 each 11  . Magnesium 200 MG TABS Take 200 mg by mouth every evening.     . mycophenolate (MYFORTIC) 180 MG EC tablet Take 180-360 mg by mouth See admin instructions. Take 2 tablets (360mg ) by mouth in the morning and 1 tablet (180mg ) by mouth at night.    . nitroGLYCERIN (NITROSTAT) 0.4 MG SL tablet PLACE 1 TABLET (0.4 MG TOTAL) UNDER THE TONGUE EVERY 5 (FIVE) MINUTES AS NEEDED FOR CHEST PAIN. 25 tablet 2  . nortriptyline (PAMELOR) 50 MG capsule TAKE 2 CAPSULES (100 MG TOTAL) BY MOUTH AT BEDTIME. 180 capsule 2  . omeprazole (PRILOSEC) 20 MG capsule TAKE 1 CAPSULE (20 MG TOTAL) BY MOUTH 2 (TWO) TIMES DAILY BEFORE A MEAL. 180 capsule 3  . Semaglutide,0.25 or 0.5MG /DOS, (OZEMPIC, 0.25 OR 0.5 MG/DOSE,) 2 MG/1.5ML SOPN Inject 1 mg into the skin 2 (two) times a week. Monday and Friday    . tacrolimus (PROGRAF) 5 MG capsule Take 5 mg by mouth 2 (two) times daily.     [name] tiZANidine (ZANAFLEX) 2 MG tablet Take 1 tablet (2 mg total) by mouth at bedtime. (Patient taking differently: Take 2 mg by mouth 2 (two)  times daily.) 30 tablet 5  . Vitamin D, Ergocalciferol, (DRISDOL) 1.25 MG (50000 UNIT) CAPS capsule Take 50,000 Units by mouth 2 (two) times a week.     No current facility-administered medications for this visit.     Past Medical History:  Diagnosis Date  . Allergy   . Angina   . Arthritis   . Backache, unspecified   . CHF (congestive heart failure) ([place])   . Chills   . Chronic kidney disease, stage III (moderate) (HCC)    HD T- TH-SAT  . Complication of anesthesia    01/2011 could not eat,, hospt x2, was placed on hd and cleared up  . Coronary atherosclerosis of native coronary artery   . Diabetes  mellitus 2004  . Dizziness   . Dysphagia, unspecified(787.20)   . Fall at home 11/2019  . Gastroparesis   . Headache(784.0)   . Hemorrhage of rectum and anus   . Hyperlipidemia   . Hypertrophy of prostate with urinary obstruction and other lower urinary tract symptoms (LUTS)   . INTERNAL HEMORRHOIDS 10/16/2008   Qualifier: Diagnosis of  By: Nolon Rod CMA (AAMA), Robin    . Internal hemorrhoids without mention of complication   . Myocardial infarction ([place]) 2002  . Nausea alone   . Obesity   . Other dyspnea and respiratory abnormality   . Other malaise and fatigue   . Personal history of unspecified circulatory disease   . Posttraumatic stress disorder   . Prostate cancer ([place])   . Sleep apnea    USES CPAP   . Stroke [facility])    2007  . Unspecified essential hypertension    hx htn     Past Surgical History:  Procedure Laterality Date  . ANAL FISSURECTOMY    . AV FISTULA PLACEMENT    . CARDIAC CATHETERIZATION     2005 DR BRODIE  . HEMORRHOID SURGERY    . 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 PROSTATE BIOPSY    . revision of fistula     renal failure  . VENTRAL HERNIA REPAIR  07/01/2011   Procedure: HERNIA REPAIR VENTRAL ADULT;  Surgeon: Joyice Faster. Cornett, MD;  Location: [facility];  Service: General;  Laterality: N/A;    Social History   Socioeconomic History  . Marital status: Divorced    Spouse name: Manufacturing engineer  . Number of children: 3  . Years of education: Not on file  . Highest education level: Associate degree: academic program  Occupational History  . Occupation: disabled    Employer: DISABLED  Tobacco Use  . Smoking status: Never Smoker  . Smokeless tobacco: Never Used  Vaping Use  . Vaping Use: Never used  Substance and Sexual Activity  . Alcohol use: No  . Drug use: No  . Sexual activity: Not Currently  Other Topics Concern  . Not on file  Social History Narrative   Married 3914. 11 year old son in 44. 1 granddaughter from [place].       Retired from [employer]  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 He lives with his wife. He drinks 3-4 cups of coffee a day. He walks most every day.   Social Determinants of Health   Financial Resource Strain: Low Risk   . Difficulty of Paying Living Expenses: Not hard at all  Food Insecurity: No Food Insecurity  .  Worried About Charity fundraiser in the Last Year: Never true  . Ran Out of Food in the Last Year: Never true  Transportation Needs: No Transportation Needs  . Lack of Transportation (Medical): No  . Lack of Transportation (Non-Medical): No  Physical Activity: Sufficiently Active  . Days of Exercise per Week: 3 days  . Minutes of Exercise per Session: 60 min  Stress: No Stress Concern Present  . Feeling of Stress : Not at all  Social Connections: Moderately Isolated  . Frequency of Communication with Friends and Family: More than three times a week  . Frequency of Social Gatherings with Friends and Family: Once a week  . Attends Religious Services: More than 4 times per year  . Active Member of Clubs or Organizations: No  . Attends Archivist Meetings: Never  . Marital Status: Divorced  Human resources officer Violence: Not At Risk  . Fear of Current or Ex-Partner: No  . Emotionally Abused: No  . Physically Abused: No  . Sexually Abused: No    Family History  Problem Relation Age of Onset  . Heart disease Father   . Renal Disease Father   . Dementia Mother   . Heart attack Mother   . Diabetes Sister   . Heart disease Sister   . Diabetes Maternal Aunt   . Diabetes Maternal Uncle   . Diabetes Paternal Aunt   . Diabetes Paternal Uncle   . Heart disease Other   . Heart attack Brother   . Lung cancer Sister   . Renal Disease Brother   . Ovarian cancer Cousin   . Lung cancer Cousin   . Breast cancer Neg Hx   . Colon cancer Neg Hx   . Pancreatic  cancer Neg Hx   . Prostate cancer Neg Hx     ROS: no fevers or chills, productive cough, hemoptysis, dysphasia, odynophagia, melena, hematochezia, dysuria, hematuria, rash, seizure activity, orthopnea, PND, pedal edema, claudication. Remaining systems are negative.  Physical Exam: Well-developed well-nourished in no acute distress.  Skin is warm and dry.  HEENT is normal.  Neck is supple.  Chest is clear to auscultation with normal expansion.  Cardiovascular exam is regular rate and rhythm.  Abdominal exam nontender or distended. No masses palpated. Extremities show no edema. neuro grossly intact  ECG- personally reviewed  A/P  1 Coronary artery disease-recent nuclear study does not appear to be high risk.  He is not in chest pain.  Plan medical therapy.  Continue aspirin and statin.  2 thoracic aortic aneurysm-we will plan repeat noncontrast chest CT December 2022.  3 pulmonary nodule-plan noncontrast chest CT December 2022.  4 status post renal transplant-follow-up nephrology.  5 peripheral vascular disease-denies claudication.  Continue medical therapy.  Kirk Ruths, MD

## 2020-10-16 NOTE — Telephone Encounter (Signed)
Unable to reach [place], Did call and speak with Mr. Jedlicka He states that he's already given the script to the [place] and he's waiting to see will the [place] or will the insurance pay for it first. He is currently in Rehab.

## 2020-10-18 DIAGNOSIS — E11649 Type 2 diabetes mellitus with hypoglycemia without coma: Secondary | ICD-10-CM | POA: Diagnosis not present

## 2020-10-18 DIAGNOSIS — R5381 Other malaise: Secondary | ICD-10-CM | POA: Diagnosis not present

## 2020-10-18 DIAGNOSIS — I639 Cerebral infarction, unspecified: Secondary | ICD-10-CM | POA: Diagnosis not present

## 2020-10-18 DIAGNOSIS — T8610 Unspecified complication of kidney transplant: Secondary | ICD-10-CM | POA: Diagnosis not present

## 2020-10-18 DIAGNOSIS — Z79899 Other long term (current) drug therapy: Secondary | ICD-10-CM | POA: Diagnosis not present

## 2020-10-18 DIAGNOSIS — W19XXXD Unspecified fall, subsequent encounter: Secondary | ICD-10-CM | POA: Diagnosis not present

## 2020-10-25 ENCOUNTER — Telehealth: Payer: Self-pay

## 2020-10-25 NOTE — Telephone Encounter (Signed)
The social worker from [facility] called asking if we received paperwork for pt. Please advise.

## 2020-10-25 NOTE — Telephone Encounter (Signed)
Pt daughter called asking if she could speak with CMA. Pt is being dc'd from rehab tomorrow and wants to follow up about pt wheelchair prescription. Please advise.

## 2020-10-28 NOTE — Telephone Encounter (Signed)
Can you see if paperwork has been received and f/u with them please?

## 2020-10-28 NOTE — Telephone Encounter (Signed)
Called and lm on Tamisha vm tcb.

## 2020-10-29 ENCOUNTER — Ambulatory Visit: Payer: Medicare Other | Admitting: Cardiology

## 2020-10-30 DIAGNOSIS — E118 Type 2 diabetes mellitus with unspecified complications: Secondary | ICD-10-CM | POA: Diagnosis not present

## 2020-11-01 DIAGNOSIS — Z905 Acquired absence of kidney: Secondary | ICD-10-CM | POA: Diagnosis not present

## 2020-11-01 DIAGNOSIS — R2681 Unsteadiness on feet: Secondary | ICD-10-CM | POA: Diagnosis not present

## 2020-11-01 DIAGNOSIS — M542 Cervicalgia: Secondary | ICD-10-CM | POA: Diagnosis not present

## 2020-11-01 DIAGNOSIS — I10 Essential (primary) hypertension: Secondary | ICD-10-CM | POA: Diagnosis not present

## 2020-11-01 DIAGNOSIS — E1142 Type 2 diabetes mellitus with diabetic polyneuropathy: Secondary | ICD-10-CM | POA: Diagnosis not present

## 2020-11-01 DIAGNOSIS — I70209 Unspecified atherosclerosis of native arteries of extremities, unspecified extremity: Secondary | ICD-10-CM | POA: Diagnosis not present

## 2020-11-01 DIAGNOSIS — R296 Repeated falls: Secondary | ICD-10-CM | POA: Diagnosis not present

## 2020-11-01 DIAGNOSIS — E559 Vitamin D deficiency, unspecified: Secondary | ICD-10-CM | POA: Diagnosis not present

## 2020-11-01 DIAGNOSIS — D849 Immunodeficiency, unspecified: Secondary | ICD-10-CM | POA: Diagnosis not present

## 2020-11-06 DIAGNOSIS — M542 Cervicalgia: Secondary | ICD-10-CM | POA: Diagnosis not present

## 2020-11-06 DIAGNOSIS — I10 Essential (primary) hypertension: Secondary | ICD-10-CM | POA: Diagnosis not present

## 2020-11-06 DIAGNOSIS — R2681 Unsteadiness on feet: Secondary | ICD-10-CM | POA: Diagnosis not present

## 2020-11-06 DIAGNOSIS — D849 Immunodeficiency, unspecified: Secondary | ICD-10-CM | POA: Diagnosis not present

## 2020-11-06 DIAGNOSIS — R296 Repeated falls: Secondary | ICD-10-CM | POA: Diagnosis not present

## 2020-11-06 DIAGNOSIS — I70209 Unspecified atherosclerosis of native arteries of extremities, unspecified extremity: Secondary | ICD-10-CM | POA: Diagnosis not present

## 2020-11-06 DIAGNOSIS — E1142 Type 2 diabetes mellitus with diabetic polyneuropathy: Secondary | ICD-10-CM | POA: Diagnosis not present

## 2020-11-06 DIAGNOSIS — Z905 Acquired absence of kidney: Secondary | ICD-10-CM | POA: Diagnosis not present

## 2020-11-06 DIAGNOSIS — E559 Vitamin D deficiency, unspecified: Secondary | ICD-10-CM | POA: Diagnosis not present

## 2020-11-07 NOTE — Telephone Encounter (Signed)
This was taken care of and [name] has been discharged for the facility.

## 2020-11-11 ENCOUNTER — Ambulatory Visit: Payer: Medicare Other | Admitting: Podiatry

## 2020-11-11 ENCOUNTER — Ambulatory Visit: Payer: Medicare Other | Admitting: Family Medicine

## 2020-11-11 DIAGNOSIS — R2681 Unsteadiness on feet: Secondary | ICD-10-CM | POA: Diagnosis not present

## 2020-11-11 DIAGNOSIS — E1142 Type 2 diabetes mellitus with diabetic polyneuropathy: Secondary | ICD-10-CM | POA: Diagnosis not present

## 2020-11-11 DIAGNOSIS — E559 Vitamin D deficiency, unspecified: Secondary | ICD-10-CM | POA: Diagnosis not present

## 2020-11-11 DIAGNOSIS — R296 Repeated falls: Secondary | ICD-10-CM | POA: Diagnosis not present

## 2020-11-11 DIAGNOSIS — I70209 Unspecified atherosclerosis of native arteries of extremities, unspecified extremity: Secondary | ICD-10-CM | POA: Diagnosis not present

## 2020-11-11 DIAGNOSIS — Z0289 Encounter for other administrative examinations: Secondary | ICD-10-CM

## 2020-11-11 DIAGNOSIS — Z905 Acquired absence of kidney: Secondary | ICD-10-CM | POA: Diagnosis not present

## 2020-11-11 DIAGNOSIS — M542 Cervicalgia: Secondary | ICD-10-CM | POA: Diagnosis not present

## 2020-11-11 DIAGNOSIS — I10 Essential (primary) hypertension: Secondary | ICD-10-CM | POA: Diagnosis not present

## 2020-11-11 DIAGNOSIS — D849 Immunodeficiency, unspecified: Secondary | ICD-10-CM | POA: Diagnosis not present

## 2020-11-19 ENCOUNTER — Telehealth: Payer: Self-pay

## 2020-11-19 NOTE — Telephone Encounter (Signed)
Pt daughter called stating pt blood sugars have been fluctuating, the lowest has dropped down to 67 and a few hours later it went up to 350. Pt has gotten very weak and needs assistance and falling without assistance, she has hired around the care help and his health is deteriorating in her opinion. She wanted to know if ok to give boost/ensure since he is not eating as he should and I advised that boost and ensure are fine. Pt states she will give the office a call to get a virtual visit scheduled to discuss with Dr. Yong Channel.

## 2020-11-19 NOTE — Telephone Encounter (Signed)
Pt's daughter called concerned about Miranda. She stated that he is getting weaker and he is getting foggy. She stated that he is having trouble rememebering certain things and his diabetes are causing an issue. She would like someone to call her if possibe. Please call her at [phone].

## 2020-11-20 ENCOUNTER — Encounter: Payer: Self-pay | Admitting: Family Medicine

## 2020-11-22 ENCOUNTER — Encounter: Payer: Self-pay | Admitting: Family Medicine

## 2020-11-22 ENCOUNTER — Telehealth (INDEPENDENT_AMBULATORY_CARE_PROVIDER_SITE_OTHER): Payer: Medicare Other | Admitting: Family Medicine

## 2020-11-22 VITALS — BP 138/82

## 2020-11-22 DIAGNOSIS — I129 Hypertensive chronic kidney disease with stage 1 through stage 4 chronic kidney disease, or unspecified chronic kidney disease: Secondary | ICD-10-CM | POA: Diagnosis not present

## 2020-11-22 DIAGNOSIS — M542 Cervicalgia: Secondary | ICD-10-CM | POA: Diagnosis not present

## 2020-11-22 DIAGNOSIS — E1142 Type 2 diabetes mellitus with diabetic polyneuropathy: Secondary | ICD-10-CM | POA: Diagnosis not present

## 2020-11-22 DIAGNOSIS — D849 Immunodeficiency, unspecified: Secondary | ICD-10-CM | POA: Diagnosis not present

## 2020-11-22 DIAGNOSIS — E559 Vitamin D deficiency, unspecified: Secondary | ICD-10-CM | POA: Diagnosis not present

## 2020-11-22 DIAGNOSIS — G8191 Hemiplegia, unspecified affecting right dominant side: Secondary | ICD-10-CM | POA: Insufficient documentation

## 2020-11-22 DIAGNOSIS — I70209 Unspecified atherosclerosis of native arteries of extremities, unspecified extremity: Secondary | ICD-10-CM | POA: Diagnosis not present

## 2020-11-22 DIAGNOSIS — I69354 Hemiplegia and hemiparesis following cerebral infarction affecting left non-dominant side: Secondary | ICD-10-CM | POA: Diagnosis not present

## 2020-11-22 DIAGNOSIS — E0843 Diabetes mellitus due to underlying condition with diabetic autonomic (poly)neuropathy: Secondary | ICD-10-CM | POA: Diagnosis not present

## 2020-11-22 DIAGNOSIS — R2681 Unsteadiness on feet: Secondary | ICD-10-CM | POA: Diagnosis not present

## 2020-11-22 DIAGNOSIS — Z794 Long term (current) use of insulin: Secondary | ICD-10-CM | POA: Diagnosis not present

## 2020-11-22 DIAGNOSIS — I10 Essential (primary) hypertension: Secondary | ICD-10-CM

## 2020-11-22 DIAGNOSIS — R296 Repeated falls: Secondary | ICD-10-CM | POA: Diagnosis not present

## 2020-11-22 DIAGNOSIS — N1831 Chronic kidney disease, stage 3a: Secondary | ICD-10-CM | POA: Diagnosis not present

## 2020-11-22 DIAGNOSIS — Z8673 Personal history of transient ischemic attack (TIA), and cerebral infarction without residual deficits: Secondary | ICD-10-CM

## 2020-11-22 NOTE — Patient Instructions (Addendum)
Health Maintenance Due  Topic Date Due   Zoster Vaccines- Shingrix (1 of 2) Discuss at next in office visit.  Never done   Fecal DNA (Cologuard) Discuss at next in office visit.  Never done   OPHTHALMOLOGY EXAM Discuss at next in office visit.  06/12/2020   COVID-19 Vaccine (4 - Booster for [employer] series) Discuss at next in office visit.  08/18/2020    Recommended follow up: No follow-ups on file.

## 2020-11-22 NOTE — Progress Notes (Signed)
Phone [phone] Virtual visit via Video note   Subjective:  Chief complaint: Chief Complaint  Patient presents with   Diabetes    Patients blood sugars has been up and down.    Fall    Patient has been falling a lot, has been losing his mobility and his daughter is very concerned about his dads well being.   This visit type was conducted due to national recommendations for restrictions regarding the COVID-19 Pandemic (e.g. social distancing).  This format is felt to be most appropriate for this patient at this time balancing risks to patient and risks to population by having him in for in person visit.  No physical exam was performed (except for noted visual exam or audio findings with Telehealth visits).    Our team/I connected with [name] at  8:20 AM EDT by a video enabled telemedicine application (doxy.me or caregility through epic) and verified that I am speaking with the correct person using two identifiers.  Location patient: [facility] Location provider: [facility], office Persons participating in the virtual visit:  patient  Our team/I discussed the limitations of evaluation and management by telemedicine and the availability of in person appointments. In light of current covid-19 pandemic, patient also understands that we are trying to protect them by minimizing in office contact if at all possible.  The patient expressed consent for telemedicine visit and agreed to proceed. Patient understands insurance will be billed.   Past Medical History-  Patient Active Problem List   Diagnosis Date Noted   Malignant neoplasm of prostate ([place]) 06/09/2019    Priority: High   PAD (peripheral artery disease) ([place]) 01/16/2019    Priority: High   Morbid obesity ([place]) 12/04/2017    Priority: High   Mild cognitive impairment 06/18/2016    Priority: High   Immunosuppressive management encounter following kidney transplant 07/12/2015    Priority: High   Renal transplant recipient  06/27/2015    Priority: High   Immunosuppression (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 ([place]) 09/05/2007    Priority: High   Protrusion of lumbar intervertebral disc     Priority: Medium   Spinal stenosis of lumbar region 09/21/2018    Priority: Medium   Hyperlipidemia 01/16/2016    Priority: Medium   Gout 12/22/2009    Priority: Medium   Obstructive sleep apnea 07/10/2009    Priority: Medium   BPH (benign prostatic hyperplasia) 09/25/2008    Priority: Medium   Low back pain 12/15/2007    Priority: Medium   Posttraumatic stress disorder 11/03/2007    Priority: Medium   Essential hypertension 01/17/2007    Priority: Medium   History of stroke 01/17/2007    Priority: Medium   Right hemiplegia ([place]) 11/22/2020    Priority: Low   GERD (gastroesophageal reflux disease) 05/16/2014    Priority: Low   Trigger thumb of right hand 01/03/2014    Priority: Low   Left foot drop 05/02/2013    Priority: Low   ED (erectile dysfunction) 04/03/2013    Priority: Low   Risk for falls 01/22/2012    Priority: Low   Gastroparesis 06/19/2008    Priority: Low   Allergic rhinitis 03/01/2007  Priority: Low   Acute kidney injury superimposed on chronic kidney disease ([place]) 10/08/2020   Frequent falls 10/08/2020   Stable angina ([place]) 09/16/2020   Pre-operative clearance 06/20/2020   H/O iron deficiency anemia 02/10/2012    Medications- reviewed and updated Current Outpatient Medications  Medication Sig Dispense Refill   allopurinol (ZYLOPRIM) 100 MG tablet Take 200 mg by mouth daily.     AMBULATORY NON FORMULARY MEDICATION Rolling Walker.  Foot drop M21.372 1 each 0   aspirin 81 MG tablet Take 81 mg by  mouth at bedtime.     atorvastatin (LIPITOR) 40 MG tablet Take 40 mg by mouth daily.     clopidogrel (PLAVIX) 75 MG tablet Take 75 mg by mouth daily.     diclofenac Sodium (VOLTAREN) 1 % GEL Apply 2 g topically 4 (four) times daily. (Patient taking differently: Apply 2 g topically 4 (four) times daily as needed (pain).) 50 g 0   gabapentin (NEURONTIN) 300 MG capsule TAKE 2 CAPSULES IN MORNING, 1 CAPSULE IN AFTERNOON, AND 2 CAPSULES AT NIGHT. (Patient taking differently: Take 300-600 mg by mouth See admin instructions. Take 600mg  in morning, 300mg  in afternoon, and 600mg  at night.) 150 capsule 0   hydrocortisone cream 1 % Apply 1 application topically 4 (four) times daily as needed for itching.     insulin aspart protamine- aspart (NOVOLOG MIX 70/30) (70-30) 100 UNIT/ML injection Inject 0.35 mLs (35 Units total) into the skin 2 (two) times daily with a meal. 10 mL 11   Insulin Syringe-Needle U-100 (B-D INS SYR ULTRAFINE 1CC/31G) 31G X 5/16" 1 ML MISC Used to give insulin injections twice daily. 100 each 11   Magnesium 200 MG TABS Take 200 mg by mouth every evening.      mycophenolate (MYFORTIC) 180 MG EC tablet Take 180-360 mg by mouth See admin instructions. Take 2 tablets (360mg ) by mouth in the morning and 1 tablet (180mg ) by mouth at night.     nitroGLYCERIN (NITROSTAT) 0.4 MG SL tablet PLACE 1 TABLET (0.4 MG TOTAL) UNDER THE TONGUE EVERY 5 (FIVE) MINUTES AS NEEDED FOR CHEST PAIN. 25 tablet 2   nortriptyline (PAMELOR) 50 MG capsule TAKE 2 CAPSULES (100 MG TOTAL) BY MOUTH AT BEDTIME. 180 capsule 2   omeprazole (PRILOSEC) 20 MG capsule TAKE 1 CAPSULE (20 MG TOTAL) BY MOUTH 2 (TWO) TIMES DAILY BEFORE A MEAL. 180 capsule 3   Semaglutide,0.25 or 0.5MG /DOS, (OZEMPIC, 0.25 OR 0.5 MG/DOSE,) 2 MG/1.5ML SOPN Inject 1 mg into the skin 2 (two) times a week. Monday and Friday     tacrolimus (PROGRAF) 5 MG capsule Take 5 mg by mouth 2 (two) times daily.      Vitamin D, Ergocalciferol, (DRISDOL) 1.25 MG (50000  UNIT) CAPS capsule Take 50,000 Units by mouth 2 (two) times a week.     AMBULATORY NON FORMULARY MEDICATION Ankle Foot Orthosis  Foot Drop M21.372 Disp 1 (Patient not taking: Reported on 11/22/2020) 1 each 0   tiZANidine (ZANAFLEX) 2 MG tablet Take 1 tablet (2 mg total) by mouth at bedtime. (Patient not taking: Reported on 11/22/2020) 30 tablet 5   No current facility-administered medications for this visit.     Objective:  BP 138/82 Comment: last home health check with PT self reported vitals Gen: NAD, resting comfortably Lungs: nonlabored, normal respiratory rate  Sitting in chair throughout visit- calls for daughter at times but does not move     Assessment and Plan   #History of frequent falls from neuropathy along  with right hemiplegia, short-term memory issues, generalized weakness -Unclear long-term living situation-patient has not felt as supportive as he would like in his home by family -Previously we tried to get social work support through [employer]  but we were unsuccessful -Patient has been working with PT and OT- still working twice a week -Has been trying to secure housing through the [place] - patient needs updated wheelchair- note to team "can order wheelchair and send to [facility] under right hemiplegia. stamp my sig. ICD 10 g81.91 and state on there that patient wants delivered if possible. have family call them by [place] if they do not call back. " -patient also requests bed rails, new BP cuff- order to be written for these ase well -weakness could be related to not eating regularly (also causing low sugars) -pretty much chair bound- needs daughters assist for most transfers.  -nurse 10-2 to help and some at nights 3 nights a week. Daughter has autistic 55 year old and is trying to work- doing her best to help.  -working with [place] for home health aide. I am happy to help with paperwork based off this visit.  - also needs bedside commode- may be able to do short transfer for  this  # Diabetes-patient has been following with Dr. Garnet Koyanagi S: Medication: At last visit-NovoLog 70/30 twice daily 50 units and Ozempic 1 mg on Monday and friday CBGs-  blood sugar was as low as 67 and later that day up to350- he feels poorly when blood sugar gets low. Occurs twice a week- can be early AM and last time was late in morning and one in afternoon- usually after morning insulin -not skipping meals for most part but happened on days he skipped meals  Daughter tries to get him something to eat quickly like peanut butter cracker then eggs  He reports came back down to 7.6 with Dr. Garnet Koyanagi lately.  Be very serious I really I think lows very very seriously Lab Results  Component Value Date   HGBA1C 9.9 (H) 09/16/2020   HGBA1C 7.1 01/30/2020   HGBA1C 7.0 (H) 01/02/2020   A/P: improving control per last a1c check (asked them to have [place] send records). We did discuss worries about lows- this is mainly happening when he skips meals- advised to not skip meals. If continued issues insulin may have to be reduced- asked him to follow up with endocrinology   #hypertension S: medication: Lasix 40 mg daily Home readings #s: checked by PT BP Readings from Last 3 Encounters:  11/22/20 138/82  10/10/20 (!) 143/94  09/16/20 128/66  A/P: Stable. Continue current medications.   #Renal cyst-MRI of the abdomen was not completed yet-prior Bosniak 2. Ordered 07/08/20- team was to investigate last visit and requested they do so again today- they will reach out directly to patient. Last imaged 03/22/2019  #fatty liver- discussed healthy eating/regular exercise/weight loss- would also help with diabetes  Recommended follow up: could push out visit in July - to 3 months perhaps Future Appointments  Date Time Provider [place]  12/19/2020  3:40 PM [name], MD [facility]  01/03/2021  3:45 PM Almyra Deforest, PA [facility]  03/07/2021  2:30 PM Pieter Partridge, DO [facility] None   07/28/2021  2:30 PM [facility] HEALTH COACH [facility]   Lab/Order associations:   ICD-10-CM   1. Diabetes mellitus due to underlying condition with diabetic autonomic neuropathy, with long-term current use of insulin (HCC)  E08.43    Z79.4  2. Right hemiplegia ([place])  G81.91     3. Essential hypertension  I10     4. History of stroke  Z86.73       No orders of the defined types were placed in this encounter.   Return precautions advised.  Garret Reddish, MD

## 2020-11-27 ENCOUNTER — Telehealth: Payer: Self-pay

## 2020-11-27 NOTE — Telephone Encounter (Signed)
Patient is scheduled   

## 2020-11-27 NOTE — Telephone Encounter (Signed)
Any appointments available at any of the other offices?

## 2020-11-27 NOTE — Telephone Encounter (Signed)
Nurse Assessment Nurse: Raenette Rover, RN, Zella Ball Date/Time (Eastern Time): 11/26/2020 1:48:11 PM Confirm and document reason for call. If symptomatic, describe symptoms. ---Caller states he is having extreme pain in his back radiating to his ankles. Office does not have any appointments available. Pain in the left side and having pain in bil legs and feet. More in the right foot than the left. No swelling. Does the patient have any new or worsening symptoms? ---Yes Will a triage be completed? ---Yes Related visit to physician within the last 2 weeks? ---No Does the PT have any chronic conditions? (i.e. diabetes, asthma, this includes High risk factors for pregnancy, etc.) ---Yes List chronic conditions. ---CAD, Diabetes, Kidney disease. Is this a behavioral health or substance abuse call? ---No Guidelines Guideline Title Affirmed Question Affirmed Notes Nurse Date/Time (Eastern Time) Chest Pain [1] Chest pain lasts > 5 minutes AND [2] age > 45 AND [3] one or more Raenette Rover, RN, Zella Ball 11/26/2020 1:53:12 PM PLEASE NOTE: All timestamps contained within this report are represented as [place] Standard Time. CONFIDENTIALTY NOTICE: This fax transmission is intended only for the addressee. It contains information that is legally privileged, confidential or otherwise protected from use or disclosure. If you are not the intended recipient, you are strictly prohibited from reviewing, disclosing, copying using or disseminating any of this information or taking any action in reliance on or regarding this information. If you have received this fax in error, please notify us immediately by telephone so that we can arrange for its return to [place]. Phone: [phone], Toll-Free: [phone], Fax: [phone] Page: 2 of 2 Call Id: [id number] Guidelines Guideline Title Affirmed Question Affirmed Notes Nurse Date/Time Eilene Ghazi Time) cardiac risk factors (e.g., diabetes, high blood pressure, high cholesterol,  smoker, or strong family history of heart disease) Disp. Time Eilene Ghazi Time) Disposition Final User 11/26/2020 2:04:14 PM 911 Outcome Documentation Raenette Rover, RN, Zella Ball Reason: on the phone with them now. 11/26/2020 1:56:09 PM Call EMS 911 Now Yes Raenette Rover, RN, Herbert Deaner Disagree/Comply Comply Caller Understands Yes PreDisposition InappropriateToAsk Care Advice Given Per Guideline CALL EMS 911 NOW: CARE ADVICE given per Chest Pain (Adult) guideline. Comments User: Wilson Singer, RN Date/Time Eilene Ghazi Time): 11/26/2020 1:49:59 PM Cancer of the prostate in remission. User: Wilson Singer, RN Date/Time Eilene Ghazi Time): 11/26/2020 1:54:46 PM rates back pain 8/10 and states pain in the chest 3-4/10 mostly in back and legs. Pain is worse than before when normally has pain 4-5 in the back all the time. User: Wilson Singer, RN Date/Time Eilene Ghazi Time): 11/26/2020 1:58:56 PM Caller states chest is also sore and having some chest pain but not too bad in the chest. Hurts from the arms down. Referrals GO TO FACILITY OTHER - SPECIFY

## 2020-11-28 ENCOUNTER — Ambulatory Visit: Payer: Medicare Other | Admitting: Family

## 2020-11-29 DIAGNOSIS — E118 Type 2 diabetes mellitus with unspecified complications: Secondary | ICD-10-CM | POA: Diagnosis not present

## 2020-11-30 ENCOUNTER — Other Ambulatory Visit: Payer: Medicare Other

## 2020-11-30 ENCOUNTER — Inpatient Hospital Stay: Admission: RE | Admit: 2020-11-30 | Payer: Medicare Other | Source: Ambulatory Visit

## 2020-12-09 ENCOUNTER — Emergency Department (HOSPITAL_COMMUNITY): Payer: No Typology Code available for payment source

## 2020-12-09 ENCOUNTER — Emergency Department (HOSPITAL_COMMUNITY)
Admission: EM | Admit: 2020-12-09 | Discharge: 2020-12-10 | Disposition: A | Discharge status: Home / Self Care | Payer: No Typology Code available for payment source | Attending: Emergency Medicine | Admitting: Emergency Medicine

## 2020-12-09 ENCOUNTER — Encounter (HOSPITAL_COMMUNITY): Payer: Self-pay | Admitting: Emergency Medicine

## 2020-12-09 ENCOUNTER — Other Ambulatory Visit: Payer: Self-pay

## 2020-12-09 DIAGNOSIS — E785 Hyperlipidemia, unspecified: Secondary | ICD-10-CM | POA: Insufficient documentation

## 2020-12-09 DIAGNOSIS — N183 Chronic kidney disease, stage 3 unspecified: Secondary | ICD-10-CM | POA: Insufficient documentation

## 2020-12-09 DIAGNOSIS — Z7984 Long term (current) use of oral hypoglycemic drugs: Secondary | ICD-10-CM | POA: Diagnosis not present

## 2020-12-09 DIAGNOSIS — I509 Heart failure, unspecified: Secondary | ICD-10-CM | POA: Diagnosis not present

## 2020-12-09 DIAGNOSIS — E1142 Type 2 diabetes mellitus with diabetic polyneuropathy: Secondary | ICD-10-CM | POA: Diagnosis not present

## 2020-12-09 DIAGNOSIS — E1151 Type 2 diabetes mellitus with diabetic peripheral angiopathy without gangrene: Secondary | ICD-10-CM | POA: Insufficient documentation

## 2020-12-09 DIAGNOSIS — E1122 Type 2 diabetes mellitus with diabetic chronic kidney disease: Secondary | ICD-10-CM | POA: Insufficient documentation

## 2020-12-09 DIAGNOSIS — E1169 Type 2 diabetes mellitus with other specified complication: Secondary | ICD-10-CM | POA: Diagnosis not present

## 2020-12-09 DIAGNOSIS — Z8673 Personal history of transient ischemic attack (TIA), and cerebral infarction without residual deficits: Secondary | ICD-10-CM | POA: Insufficient documentation

## 2020-12-09 DIAGNOSIS — I130 Hypertensive heart and chronic kidney disease with heart failure and stage 1 through stage 4 chronic kidney disease, or unspecified chronic kidney disease: Secondary | ICD-10-CM | POA: Insufficient documentation

## 2020-12-09 DIAGNOSIS — Z794 Long term (current) use of insulin: Secondary | ICD-10-CM | POA: Diagnosis not present

## 2020-12-09 DIAGNOSIS — Z7902 Long term (current) use of antithrombotics/antiplatelets: Secondary | ICD-10-CM | POA: Insufficient documentation

## 2020-12-09 DIAGNOSIS — Z940 Kidney transplant status: Secondary | ICD-10-CM | POA: Insufficient documentation

## 2020-12-09 DIAGNOSIS — Z8546 Personal history of malignant neoplasm of prostate: Secondary | ICD-10-CM | POA: Diagnosis not present

## 2020-12-09 DIAGNOSIS — Z79899 Other long term (current) drug therapy: Secondary | ICD-10-CM | POA: Insufficient documentation

## 2020-12-09 DIAGNOSIS — Z7982 Long term (current) use of aspirin: Secondary | ICD-10-CM | POA: Diagnosis not present

## 2020-12-09 DIAGNOSIS — R55 Syncope and collapse: Secondary | ICD-10-CM | POA: Insufficient documentation

## 2020-12-09 DIAGNOSIS — M545 Low back pain, unspecified: Secondary | ICD-10-CM | POA: Diagnosis not present

## 2020-12-09 DIAGNOSIS — I2510 Atherosclerotic heart disease of native coronary artery without angina pectoris: Secondary | ICD-10-CM | POA: Diagnosis not present

## 2020-12-09 LAB — CBC WITH DIFFERENTIAL/PLATELET
Abs Immature Granulocytes: 0.01 10*3/uL (ref 0.00–0.07)
Basophils Absolute: 0 10*3/uL (ref 0.0–0.1)
Basophils Relative: 0 %
Eosinophils Absolute: 0.1 10*3/uL (ref 0.0–0.5)
Eosinophils Relative: 2 %
HCT: 44 % (ref 39.0–52.0)
Hemoglobin: 13.3 g/dL (ref 13.0–17.0)
Immature Granulocytes: 0 %
Lymphocytes Relative: 12 %
Lymphs Abs: 0.4 10*3/uL — ABNORMAL LOW (ref 0.7–4.0)
MCH: 27.2 pg (ref 26.0–34.0)
MCHC: 30.2 g/dL (ref 30.0–36.0)
MCV: 90 fL (ref 80.0–100.0)
Monocytes Absolute: 0.4 10*3/uL (ref 0.1–1.0)
Monocytes Relative: 10 %
Neutro Abs: 2.9 10*3/uL (ref 1.7–7.7)
Neutrophils Relative %: 76 %
Platelets: 226 10*3/uL (ref 150–400)
RBC: 4.89 MIL/uL (ref 4.22–5.81)
RDW: 15.1 % (ref 11.5–15.5)
WBC: 3.7 10*3/uL — ABNORMAL LOW (ref 4.0–10.5)
nRBC: 0 % (ref 0.0–0.2)

## 2020-12-09 LAB — URINALYSIS, ROUTINE W REFLEX MICROSCOPIC
Bacteria, UA: NONE SEEN
Bilirubin Urine: NEGATIVE
Glucose, UA: NEGATIVE mg/dL
Ketones, ur: NEGATIVE mg/dL
Leukocytes,Ua: NEGATIVE
Nitrite: NEGATIVE
Protein, ur: NEGATIVE mg/dL
Specific Gravity, Urine: 1.012 (ref 1.005–1.030)
pH: 7 (ref 5.0–8.0)

## 2020-12-09 LAB — LIPASE, BLOOD: Lipase: 28 U/L (ref 11–51)

## 2020-12-09 LAB — TROPONIN I (HIGH SENSITIVITY): Troponin I (High Sensitivity): 23 ng/L — ABNORMAL HIGH (ref <18)

## 2020-12-09 LAB — COMPREHENSIVE METABOLIC PANEL
ALT: 9 U/L (ref 0–44)
AST: 13 U/L — ABNORMAL LOW (ref 15–41)
Albumin: 2.9 g/dL — ABNORMAL LOW (ref 3.5–5.0)
Alkaline Phosphatase: 82 U/L (ref 38–126)
Anion gap: 9 (ref 5–15)
BUN: 32 mg/dL — ABNORMAL HIGH (ref 8–23)
CO2: 29 mmol/L (ref 22–32)
Calcium: 9.3 mg/dL (ref 8.9–10.3)
Chloride: 99 mmol/L (ref 98–111)
Creatinine, Ser: 2.77 mg/dL — ABNORMAL HIGH (ref 0.61–1.24)
GFR, Estimated: 24 mL/min — ABNORMAL LOW (ref >60)
Glucose, Bld: 104 mg/dL — ABNORMAL HIGH (ref 70–99)
Potassium: 3.7 mmol/L (ref 3.5–5.1)
Sodium: 137 mmol/L (ref 135–145)
Total Bilirubin: 0.8 mg/dL (ref 0.3–1.2)
Total Protein: 6 g/dL — ABNORMAL LOW (ref 6.5–8.1)

## 2020-12-09 NOTE — ED Provider Notes (Signed)
Emergency Medicine Provider Triage Evaluation Note  [name] , a 70 y.o. male  was evaluated in triage.  Pt complains of syncopal episode that occurred earlier today. Patient states he was getting out of the shower and passed out while sitting on his shower chair. Did not fall off the chair. He admits to chest pain and shortness of breath currently. He also endorses low back pain. He has chronic low back pain; however pain worsened after a fall last week. Patient states his pain "moves everywhere". Patient denies any hip pain. Typically gets around with wheelchair or walker.  Review of Systems  Positive: Back pain, syncope, chest pain, sob Negative: fever  Physical Exam  BP 133/79 (BP Location: Right Arm)   Pulse 92   Temp 98.6 F (37 C) (Oral)   Resp 18   SpO2 98%  Gen:   Awake, no distress   Resp:  Normal effort  MSK:   Moves extremities without difficulty  Other:  Diffuse abdominal tenderness, lumbar midline tenderness  Medical Decision Making  Medically screening exam initiated at 9:01 PM.  Appropriate orders placed.  [name] was informed that the remainder of the evaluation will be completed by another provider, this initial triage assessment does not replace that evaluation, and the importance of remaining in the ED until their evaluation is complete.  Labs ordered Will defer CT images to provider given abdomen is significantly tender in triage. If needed, could do no charge L spine.    Karie Kirks 12/09/20 2106    Pattricia Boss, MD 12/09/20 2144

## 2020-12-09 NOTE — ED Triage Notes (Signed)
Patient arrived with EMS from home , syncopal episode this evening with stool incontinence , CBG= 119, patient also fell last Tuesday with low back pain radiating to left hip / left shoulder pain .

## 2020-12-10 ENCOUNTER — Emergency Department (HOSPITAL_COMMUNITY): Payer: No Typology Code available for payment source

## 2020-12-10 LAB — TROPONIN I (HIGH SENSITIVITY): Troponin I (High Sensitivity): 23 ng/L — ABNORMAL HIGH (ref <18)

## 2020-12-10 LAB — CBG MONITORING, ED: Glucose-Capillary: 150 mg/dL — ABNORMAL HIGH (ref 70–99)

## 2020-12-10 MED ORDER — SODIUM CHLORIDE 0.9 % IV BOLUS
1000.0000 mL | Freq: Once | INTRAVENOUS | Status: AC
  Administered 2020-12-10: 1000 mL via INTRAVENOUS

## 2020-12-10 NOTE — ED Notes (Signed)
Contacted Social Work about pt transportation.

## 2020-12-10 NOTE — ED Notes (Signed)
NT helped pt get up from bed and use walker to ambulate to restroom.

## 2020-12-10 NOTE — ED Notes (Signed)
Pt was ambulating in room with walker packing his bag.

## 2020-12-10 NOTE — ED Notes (Signed)
Returned from CT scan.

## 2020-12-10 NOTE — Discharge Instructions (Addendum)
Drink plenty of fluids.  Rest the next couple days and side.  Your nephrologist will make arrangements for you to have some blood test done to check your kidney function again.  Follow-up as needed

## 2020-12-10 NOTE — ED Notes (Signed)
Informed pt that he did not meet the criteria for PTAR. Yolanda Bonine stated he will take pt home. Provided pt with urinal, beverage, and placed all belongings in bag. Pt was transported to lobby in wheel chair to wait for ride.

## 2020-12-10 NOTE — ED Notes (Signed)
Checked pt CBG. Will recheck in 2h.

## 2020-12-10 NOTE — ED Notes (Signed)
Patient transported to CT 

## 2020-12-10 NOTE — Progress Notes (Signed)
This chaplain responded to RN's page for Pt. Advance Directive assistance.   Upon arrival the chaplain learned the Pt. request is for a notary. The chaplain informed the Pt. and family, notary services are not available at this time. The chaplain educated the Pt. on notary services outside the hospital.  The RN is providing the Pt. with an extra copy of the visit summary.

## 2020-12-10 NOTE — ED Notes (Signed)
Pt asked several staff members to take him to a room so that he is able to change. This NT took him to a room for him to change his clothes. This NT asked pt several times if he were able to get dressed himself, pt replied with yes and requested privacy. This NT stood outside the room waiting for pt to change clothes and heard pt fall. Pt stated that he slid and fell to the floor. Charge RN made aware and assisted to get pt back into chair and assisted to finish getting clothes on.

## 2020-12-10 NOTE — ED Provider Notes (Signed)
[facility] EMERGENCY DEPARTMENT Provider Note   CSN: [id number] Arrival date & time: 12/09/20  2038     History Chief Complaint  Patient presents with   Syncope / Back Pain    [name] is a 70 y.o. male.  Patient states that he got out of the shower and was sitting down and passed out.  He complains of lower back pain.  He has a history of back pain  The history is provided by the patient and medical records. No language interpreter was used.  Fall This is a new problem. The current episode started 6 to 12 hours ago. The problem occurs rarely. The problem has been resolved. Pertinent negatives include no chest pain, no abdominal pain and no headaches. Nothing aggravates the symptoms. He has tried nothing for the symptoms. The treatment provided no relief.      Past Medical History:  Diagnosis Date   Allergy    Angina    Arthritis    Backache, unspecified    CHF (congestive heart failure) (HCC)    Chills    Chronic kidney disease, stage III (moderate) ([place])    HD T- TH-SAT   Complication of anesthesia    01/2011 could not eat,, hospt x2, was placed on hd and cleared up   Coronary atherosclerosis of native coronary artery    Diabetes mellitus 2004   Dizziness    Dysphagia, unspecified(787.20)    Fall at home 11/2019   Gastroparesis    Headache(784.0)    Hemorrhage of rectum and anus    Hyperlipidemia    Hypertrophy of prostate with urinary obstruction and other lower urinary tract symptoms (LUTS)    INTERNAL HEMORRHOIDS 10/16/2008   Qualifier: Diagnosis of  By: Nolon Rod CMA (AAMA), Robin     Internal hemorrhoids without mention of complication    Myocardial infarction ([place]) 2002   Nausea alone    Obesity    Other dyspnea and respiratory abnormality    Other malaise and fatigue    Personal history of unspecified circulatory disease    Posttraumatic stress disorder    Prostate cancer ([place])    Sleep apnea    USES CPAP    Stroke ([place])     2007   Unspecified essential hypertension    hx htn     Patient Active Problem List   Diagnosis Date Noted   Right hemiplegia ([place]) 11/22/2020   Acute kidney injury superimposed on chronic kidney disease ([place]) 10/08/2020   Frequent falls 10/08/2020   Stable angina ([place]) 09/16/2020   Pre-operative clearance 06/20/2020   Protrusion of lumbar intervertebral disc    Malignant neoplasm of prostate ([place]) 06/09/2019   PAD (peripheral artery disease) ([place]) 01/16/2019   Spinal stenosis of lumbar region 09/21/2018   Morbid obesity ([place]) 12/04/2017   Mild cognitive impairment 06/18/2016   Hyperlipidemia 01/16/2016   Immunosuppressive management encounter following kidney transplant 07/12/2015   Renal transplant recipient 06/27/2015   Immunosuppression ([place]) 06/27/2015   Thoracic aortic aneurysm ([place]) 10/23/2014   GERD (gastroesophageal reflux disease) 05/16/2014   Trigger thumb of right hand 01/03/2014   Left foot drop 05/02/2013   ED (erectile dysfunction) 04/03/2013   H/O iron deficiency anemia 02/10/2012   Risk for falls 01/22/2012   Diabetic neuropathy ([place]) 10/14/2010   Gout 12/22/2009   Obstructive sleep apnea 07/10/2009   CAD -s/p DES to marginal vessel at [facility] 2014 04/09/2009   BPH (benign prostatic hyperplasia) 09/25/2008   Gastroparesis 06/19/2008   Low  back pain 12/15/2007   Posttraumatic stress disorder 11/03/2007   Diabetes mellitus due to underlying condition with diabetic autonomic (poly)neuropathy ([place]) 09/05/2007   Allergic rhinitis 03/01/2007   Essential hypertension 01/17/2007   History of stroke 01/17/2007    Past Surgical History:  Procedure Laterality Date   ANAL FISSURECTOMY     AV FISTULA PLACEMENT     CARDIAC CATHETERIZATION     2005 DR BRODIE   HEMORRHOID SURGERY     kindey transplant  05/2017   PROSTATE BIOPSY     revision of fistula     renal failure   VENTRAL HERNIA REPAIR  07/01/2011   Procedure: HERNIA REPAIR VENTRAL ADULT;  Surgeon: Joyice Faster. [place], MD;  Location: [facility] OR;  Service: General;  Laterality: N/A;       Family History  Problem Relation Age of Onset   Heart disease Father    Renal Disease Father    Dementia Mother    Heart attack Mother    Diabetes Sister    Heart disease Sister    Diabetes Maternal Aunt    Diabetes Maternal Uncle    Diabetes Paternal Aunt    Diabetes Paternal Uncle    Heart disease Other    Heart attack Brother    Lung cancer Sister    Renal Disease Brother    Ovarian cancer Cousin    Lung cancer Cousin    Breast cancer Neg Hx    Colon cancer Neg Hx    Pancreatic cancer Neg Hx    Prostate cancer Neg Hx     Social History   Tobacco Use   Smoking status: Never   Smokeless tobacco: Never  Vaping Use   Vaping Use: Never used  Substance Use Topics   Alcohol use: No   Drug use: No    Home Medications Prior to Admission medications   Medication Sig Start Date End Date Taking? Authorizing Provider  allopurinol (ZYLOPRIM) 100 MG tablet Take 200 mg by mouth daily.    [provider]  AMBULATORY NON FORMULARY MEDICATION Ankle Foot Orthosis  Foot Drop M21.372 Disp 1 Patient not taking: Reported on 11/22/2020 03/04/20   Gregor Hams, MD  AMBULATORY NON FORMULARY MEDICATION [place] Walker.  Foot drop M21.372 03/04/20   Gregor Hams, MD  aspirin 81 MG tablet Take 81 mg by mouth at bedtime.    [provider]  atorvastatin (LIPITOR) 40 MG tablet Take 40 mg by mouth daily.    [provider]  clopidogrel (PLAVIX) 75 MG tablet Take 75 mg by mouth daily.    [provider]  diclofenac Sodium (VOLTAREN) 1 % GEL Apply 2 g topically 4 (four) times daily. Patient taking differently: Apply 2 g topically 4 (four) times daily as needed (pain). 12/21/19   Geradine Girt, DO  gabapentin (NEURONTIN) 300 MG capsule TAKE 2 CAPSULES IN MORNING, 1 CAPSULE IN AFTERNOON, AND 2 CAPSULES AT NIGHT. Patient taking differently: Take 300-600 mg by mouth See admin  instructions. Take 600mg  in morning, 300mg  in afternoon, and 600mg  at night. 07/23/20   Pieter Partridge, DO  hydrocortisone cream 1 % Apply 1 application topically 4 (four) times daily as needed for itching.    [provider]  insulin aspart protamine- aspart (NOVOLOG MIX 70/30) (70-30) 100 UNIT/ML injection Inject 0.35 mLs (35 Units total) into the skin 2 (two) times daily with a meal. 10/10/20   Arrien, Jimmy Picket, MD  Insulin Syringe-Needle U-100 (B-D INS SYR ULTRAFINE  1CC/31G) 31G X 5/16" 1 ML MISC Used to give insulin injections twice daily. 01/10/20   [name], MD  Magnesium 200 MG TABS Take 200 mg by mouth every evening.     [provider]  mycophenolate (MYFORTIC) 180 MG EC tablet Take 180-360 mg by mouth See admin instructions. Take 2 tablets (360mg ) by mouth in the morning and 1 tablet (180mg ) by mouth at night.    [provider]  nitroGLYCERIN (NITROSTAT) 0.4 MG SL tablet PLACE 1 TABLET (0.4 MG TOTAL) UNDER THE TONGUE EVERY 5 (FIVE) MINUTES AS NEEDED FOR CHEST PAIN. 01/25/18   Skeet Latch, MD  nortriptyline (PAMELOR) 50 MG capsule TAKE 2 CAPSULES (100 MG TOTAL) BY MOUTH AT BEDTIME. 01/01/20   Jaffe, Adam R, DO  omeprazole (PRILOSEC) 20 MG capsule TAKE 1 CAPSULE (20 MG TOTAL) BY MOUTH 2 (TWO) TIMES DAILY BEFORE A MEAL. 07/04/20   [name], MD  Semaglutide,0.25 or 0.5MG /DOS, (OZEMPIC, 0.25 OR 0.5 MG/DOSE,) 2 MG/1.5ML SOPN Inject 1 mg into the skin 2 (two) times a week. Monday and Friday    [provider]  tacrolimus (PROGRAF) 5 MG capsule Take 5 mg by mouth 2 (two) times daily.  11/26/17   [provider]  tiZANidine (ZANAFLEX) 2 MG tablet Take 1 tablet (2 mg total) by mouth at bedtime. Patient not taking: Reported on 11/22/2020 08/29/20   Pieter Partridge, DO  Vitamin D, Ergocalciferol, (DRISDOL) 1.25 MG (50000 UNIT) CAPS capsule Take 50,000 Units by mouth 2 (two) times a week. 04/26/19   [provider]    Allergies     Codeine  Review of Systems   Review of Systems  Constitutional:  Negative for appetite change and fatigue.  HENT:  Negative for congestion, ear discharge and sinus pressure.   Eyes:  Negative for discharge.  Respiratory:  Negative for cough.   Cardiovascular:  Negative for chest pain.  Gastrointestinal:  Negative for abdominal pain and diarrhea.  Genitourinary:  Negative for frequency and hematuria.  Musculoskeletal:  Positive for back pain.  Skin:  Negative for rash.  Neurological:  Negative for seizures and headaches.       Syncope  Psychiatric/Behavioral:  Negative for hallucinations.    Physical Exam Updated Vital Signs BP (!) 177/107   Pulse 79   Temp 97.8 F (36.6 C) (Oral)   Resp 10   SpO2 100%   Physical Exam Vitals and nursing note reviewed.  Constitutional:      Appearance: He is well-developed.  HENT:     Head: Normocephalic.     Nose: Nose normal.  Eyes:     General: No scleral icterus.    Conjunctiva/sclera: Conjunctivae normal.  Neck:     Thyroid: No thyromegaly.  Cardiovascular:     Rate and Rhythm: Normal rate and regular rhythm.     Heart sounds: No murmur heard.   No friction rub. No gallop.  Pulmonary:     Breath sounds: No stridor. No wheezing or rales.  Chest:     Chest wall: No tenderness.  Abdominal:     General: There is no distension.     Tenderness: There is no abdominal tenderness. There is no rebound.  Musculoskeletal:     Cervical back: Neck supple.     Comments: Tender lumbar muscle  Lymphadenopathy:     Cervical: No cervical adenopathy.  Skin:    Findings: No erythema or rash.  Neurological:     Mental Status: He is alert and  oriented to person, place, and time.     Motor: No abnormal muscle tone.     Coordination: Coordination normal.  Psychiatric:        Behavior: Behavior normal.    ED Results / Procedures / Treatments   Labs (all labs ordered are listed, but only abnormal results are displayed) Labs Reviewed  CBC  WITH DIFFERENTIAL/PLATELET - Abnormal; Notable for the following components:      Result Value   WBC 3.7 (*)    Lymphs Abs 0.4 (*)    All other components within normal limits  COMPREHENSIVE METABOLIC PANEL - Abnormal; Notable for the following components:   Glucose, Bld 104 (*)    BUN 32 (*)    Creatinine, Ser 2.77 (*)    Total Protein 6.0 (*)    Albumin 2.9 (*)    AST 13 (*)    GFR, Estimated 24 (*)    All other components within normal limits  URINALYSIS, ROUTINE W REFLEX MICROSCOPIC - Abnormal; Notable for the following components:   Hgb urine dipstick SMALL (*)    All other components within normal limits  CBG MONITORING, ED - Abnormal; Notable for the following components:   Glucose-Capillary 150 (*)    All other components within normal limits  TROPONIN I (HIGH SENSITIVITY) - Abnormal; Notable for the following components:   Troponin I (High Sensitivity) 23 (*)    All other components within normal limits  TROPONIN I (HIGH SENSITIVITY) - Abnormal; Notable for the following components:   Troponin I (High Sensitivity) 23 (*)    All other components within normal limits  LIPASE, BLOOD    EKG EKG Interpretation  Date/Time:  Monday December 09 2020 20:46:57 EDT Ventricular Rate:  92 PR Interval:  200 QRS Duration: 100 QT Interval:  356 QTC Calculation: 440 R Axis:   -38 Text Interpretation: Normal sinus rhythm Left axis deviation Low voltage QRS Nonspecific T wave abnormality Abnormal ECG Confirmed by Addison Lank ([id number]) on 12/10/2020 4:02:44 AM  Radiology CT ABDOMEN PELVIS WO CONTRAST  Result Date: 12/10/2020 CLINICAL DATA:  Diffuse lower abdominal pain. EXAM: CT ABDOMEN AND PELVIS WITHOUT CONTRAST TECHNIQUE: Multidetector CT imaging of the abdomen and pelvis was performed following the standard protocol without IV contrast. COMPARISON:  MRI abdomen dated March 22, 2019. CT abdomen pelvis dated November 11, 2012. FINDINGS: Lower chest: No acute abnormality. Hepatobiliary:  No focal liver abnormality is seen. No gallstones, gallbladder wall thickening, or biliary dilatation. Pancreas: Unremarkable. No pancreatic ductal dilatation or surrounding inflammatory changes. Spleen: Normal in size without focal abnormality. Adrenals/Urinary Tract: The adrenal glands are unremarkable. Native bilateral kidneys are atrophic. Right lower quadrant renal transplant. Two simple cysts in the renal transplant measuring up to 2.0 cm. No renal calculi or hydronephrosis. The bladder is unremarkable. Stomach/Bowel: Stomach is within normal limits. Appendix appears normal. No evidence of bowel wall thickening, distention, or inflammatory changes. Mild colonic diverticulosis. Vascular/Lymphatic: Aortic atherosclerosis. No enlarged abdominal or pelvic lymph nodes. Reproductive: Moderate prostatomegaly. Other: No abdominal wall hernia or abnormality. No abdominopelvic ascites. No pneumoperitoneum. Musculoskeletal: No acute or significant osseous findings. IMPRESSION: 1. No acute intra-abdominal process. 2. Aortic Atherosclerosis (ICD10-I70.0). Electronically Signed   By: Titus Dubin M.D.   On: 12/10/2020 12:20   DG Chest 2 View  Result Date: 12/09/2020 CLINICAL DATA:  Chest pain EXAM: CHEST - 2 VIEW COMPARISON:  10/08/2020 FINDINGS: Lungs are clear.  No pleural effusion or pneumothorax. The heart is normal in size. Visualized osseous structures are within  normal limits. IMPRESSION: Normal chest radiographs. Electronically Signed   By: Julian Hy M.D.   On: 12/09/2020 21:37   CT Head Wo Contrast  Result Date: 12/10/2020 CLINICAL DATA:  Persistent altered mental status following concussion in May of 2022 EXAM: CT HEAD WITHOUT CONTRAST CT CERVICAL SPINE WITHOUT CONTRAST TECHNIQUE: Multidetector CT imaging of the head and cervical spine was performed following the standard protocol without intravenous contrast. Multiplanar CT image reconstructions of the cervical spine were also generated.  COMPARISON:  10/08/2020 FINDINGS: CT HEAD FINDINGS Brain: Mild atrophic changes are again seen. No findings to suggest acute hemorrhage, acute infarction or space-occupying mass lesion are seen. Vascular: No hyperdense vessel or unexpected calcification. Skull: Normal. Negative for fracture or focal lesion. Sinuses/Orbits: Mucosal thickening is noted within the maxillary antra bilaterally stable in appearance from the prior exam. Other: None. CT CERVICAL SPINE FINDINGS Alignment: Within normal limits. Skull base and vertebrae: 7 cervical segments are well visualized. Vertebral body height is well maintained. Mild facet hypertrophic changes are seen. Disc space narrowing is noted at C5-6 with mild osteophytic change. The odontoid is within normal limits. No acute fracture or acute facet abnormality is seen. Soft tissues and spinal canal: Surrounding soft tissue structures show vascular calcifications. No other focal abnormality is noted. Upper chest: Visualized lung apices are unremarkable. Other: None IMPRESSION: CT of the head: Chronic atrophic changes without acute abnormality. Bilateral maxillary mucosal thickening stable in appearance from the prior study. CT of cervical spine: Multilevel degenerative change without acute abnormality. Electronically Signed   By: Inez Catalina M.D.   On: 12/10/2020 12:18   CT Cervical Spine Wo Contrast  Result Date: 12/10/2020 CLINICAL DATA:  Persistent altered mental status following concussion in May of 2022 EXAM: CT HEAD WITHOUT CONTRAST CT CERVICAL SPINE WITHOUT CONTRAST TECHNIQUE: Multidetector CT imaging of the head and cervical spine was performed following the standard protocol without intravenous contrast. Multiplanar CT image reconstructions of the cervical spine were also generated. COMPARISON:  10/08/2020 FINDINGS: CT HEAD FINDINGS Brain: Mild atrophic changes are again seen. No findings to suggest acute hemorrhage, acute infarction or space-occupying mass lesion  are seen. Vascular: No hyperdense vessel or unexpected calcification. Skull: Normal. Negative for fracture or focal lesion. Sinuses/Orbits: Mucosal thickening is noted within the maxillary antra bilaterally stable in appearance from the prior exam. Other: None. CT CERVICAL SPINE FINDINGS Alignment: Within normal limits. Skull base and vertebrae: 7 cervical segments are well visualized. Vertebral body height is well maintained. Mild facet hypertrophic changes are seen. Disc space narrowing is noted at C5-6 with mild osteophytic change. The odontoid is within normal limits. No acute fracture or acute facet abnormality is seen. Soft tissues and spinal canal: Surrounding soft tissue structures show vascular calcifications. No other focal abnormality is noted. Upper chest: Visualized lung apices are unremarkable. Other: None IMPRESSION: CT of the head: Chronic atrophic changes without acute abnormality. Bilateral maxillary mucosal thickening stable in appearance from the prior study. CT of cervical spine: Multilevel degenerative change without acute abnormality. Electronically Signed   By: Inez Catalina M.D.   On: 12/10/2020 12:18   DG Shoulder Left  Result Date: 12/09/2020 CLINICAL DATA:  Left shoulder pain since a fall 12/03/2020. Initial encounter. EXAM: LEFT SHOULDER - 2+ VIEW COMPARISON:  Plain films left shoulder 10/08/2020. FINDINGS: There is no evidence of fracture or dislocation. Mild degenerative disease about the shoulder again seen. Soft tissues are unremarkable. IMPRESSION: No acute abnormality. Electronically Signed   By: Inge Rise M.D.  On: 12/09/2020 21:37    Procedures Procedures   Medications Ordered in ED Medications  sodium chloride 0.9 % bolus 1,000 mL (1,000 mLs Intravenous New Bag/Given 12/10/20 1441)    ED Course  I have reviewed the triage vital signs and the nursing notes.  Pertinent labs & imaging results that were available during my care of the patient were reviewed by  me and considered in my medical decision making (see chart for details).    MDM Rules/Calculators/A&P                           Patient with syncope.  Patient is dehydrated and is given 1 L of fluids.  I spoke to nephrology and they will follow up the next couple days with blood work to check his kidney function Final Clinical Impression(s) / ED Diagnoses Final diagnoses:  Syncope and collapse    Rx / DC Orders ED Discharge Orders     None        Milton Ferguson, MD 12/12/20 [phone]

## 2020-12-10 NOTE — ED Notes (Signed)
Pt found walking across the waiting room, stated that he was going to the bathroom. Educated pt on being a fall risk and the need to ask for assistance following his fall earlier. Pt stated that he just needs a little help but stated understanding of asking for assistance.

## 2020-12-12 ENCOUNTER — Telehealth: Payer: Self-pay

## 2020-12-12 DIAGNOSIS — M542 Cervicalgia: Secondary | ICD-10-CM | POA: Diagnosis not present

## 2020-12-12 DIAGNOSIS — E559 Vitamin D deficiency, unspecified: Secondary | ICD-10-CM | POA: Diagnosis not present

## 2020-12-12 DIAGNOSIS — R2681 Unsteadiness on feet: Secondary | ICD-10-CM | POA: Diagnosis not present

## 2020-12-12 DIAGNOSIS — D849 Immunodeficiency, unspecified: Secondary | ICD-10-CM | POA: Diagnosis not present

## 2020-12-12 DIAGNOSIS — E1142 Type 2 diabetes mellitus with diabetic polyneuropathy: Secondary | ICD-10-CM | POA: Diagnosis not present

## 2020-12-12 DIAGNOSIS — R296 Repeated falls: Secondary | ICD-10-CM | POA: Diagnosis not present

## 2020-12-12 DIAGNOSIS — I70209 Unspecified atherosclerosis of native arteries of extremities, unspecified extremity: Secondary | ICD-10-CM | POA: Diagnosis not present

## 2020-12-12 DIAGNOSIS — I129 Hypertensive chronic kidney disease with stage 1 through stage 4 chronic kidney disease, or unspecified chronic kidney disease: Secondary | ICD-10-CM | POA: Diagnosis not present

## 2020-12-12 DIAGNOSIS — I69354 Hemiplegia and hemiparesis following cerebral infarction affecting left non-dominant side: Secondary | ICD-10-CM | POA: Diagnosis not present

## 2020-12-12 DIAGNOSIS — N1831 Chronic kidney disease, stage 3a: Secondary | ICD-10-CM | POA: Diagnosis not present

## 2020-12-13 NOTE — Telephone Encounter (Signed)
[facility] is requesting PT for this patient but he has turned it down several times the orders are:  2x a week for 3 weeks 1x a week for 1 week  I have not received the faxed order for the provider to sign

## 2020-12-16 DIAGNOSIS — N1831 Chronic kidney disease, stage 3a: Secondary | ICD-10-CM | POA: Diagnosis not present

## 2020-12-16 DIAGNOSIS — I70209 Unspecified atherosclerosis of native arteries of extremities, unspecified extremity: Secondary | ICD-10-CM | POA: Diagnosis not present

## 2020-12-16 DIAGNOSIS — R296 Repeated falls: Secondary | ICD-10-CM | POA: Diagnosis not present

## 2020-12-16 DIAGNOSIS — D849 Immunodeficiency, unspecified: Secondary | ICD-10-CM | POA: Diagnosis not present

## 2020-12-16 DIAGNOSIS — R2681 Unsteadiness on feet: Secondary | ICD-10-CM | POA: Diagnosis not present

## 2020-12-16 DIAGNOSIS — I129 Hypertensive chronic kidney disease with stage 1 through stage 4 chronic kidney disease, or unspecified chronic kidney disease: Secondary | ICD-10-CM | POA: Diagnosis not present

## 2020-12-16 DIAGNOSIS — M542 Cervicalgia: Secondary | ICD-10-CM | POA: Diagnosis not present

## 2020-12-16 DIAGNOSIS — E559 Vitamin D deficiency, unspecified: Secondary | ICD-10-CM | POA: Diagnosis not present

## 2020-12-16 DIAGNOSIS — I69354 Hemiplegia and hemiparesis following cerebral infarction affecting left non-dominant side: Secondary | ICD-10-CM | POA: Diagnosis not present

## 2020-12-16 DIAGNOSIS — E1142 Type 2 diabetes mellitus with diabetic polyneuropathy: Secondary | ICD-10-CM | POA: Diagnosis not present

## 2020-12-18 DIAGNOSIS — R296 Repeated falls: Secondary | ICD-10-CM | POA: Diagnosis not present

## 2020-12-18 DIAGNOSIS — M542 Cervicalgia: Secondary | ICD-10-CM | POA: Diagnosis not present

## 2020-12-18 DIAGNOSIS — I70209 Unspecified atherosclerosis of native arteries of extremities, unspecified extremity: Secondary | ICD-10-CM | POA: Diagnosis not present

## 2020-12-18 DIAGNOSIS — I69354 Hemiplegia and hemiparesis following cerebral infarction affecting left non-dominant side: Secondary | ICD-10-CM | POA: Diagnosis not present

## 2020-12-18 DIAGNOSIS — E1142 Type 2 diabetes mellitus with diabetic polyneuropathy: Secondary | ICD-10-CM | POA: Diagnosis not present

## 2020-12-18 DIAGNOSIS — R2681 Unsteadiness on feet: Secondary | ICD-10-CM | POA: Diagnosis not present

## 2020-12-18 DIAGNOSIS — E559 Vitamin D deficiency, unspecified: Secondary | ICD-10-CM | POA: Diagnosis not present

## 2020-12-18 DIAGNOSIS — N1831 Chronic kidney disease, stage 3a: Secondary | ICD-10-CM | POA: Diagnosis not present

## 2020-12-18 DIAGNOSIS — I129 Hypertensive chronic kidney disease with stage 1 through stage 4 chronic kidney disease, or unspecified chronic kidney disease: Secondary | ICD-10-CM | POA: Diagnosis not present

## 2020-12-18 DIAGNOSIS — D849 Immunodeficiency, unspecified: Secondary | ICD-10-CM | POA: Diagnosis not present

## 2020-12-19 ENCOUNTER — Ambulatory Visit (INDEPENDENT_AMBULATORY_CARE_PROVIDER_SITE_OTHER): Payer: Medicare Other | Admitting: Family Medicine

## 2020-12-19 ENCOUNTER — Encounter: Payer: Self-pay | Admitting: Family Medicine

## 2020-12-19 ENCOUNTER — Other Ambulatory Visit: Payer: Self-pay

## 2020-12-19 VITALS — BP 119/74 | HR 95 | Temp 98.9°F | Ht 70.0 in

## 2020-12-19 DIAGNOSIS — Z8673 Personal history of transient ischemic attack (TIA), and cerebral infarction without residual deficits: Secondary | ICD-10-CM

## 2020-12-19 DIAGNOSIS — M5442 Lumbago with sciatica, left side: Secondary | ICD-10-CM | POA: Diagnosis not present

## 2020-12-19 DIAGNOSIS — M48061 Spinal stenosis, lumbar region without neurogenic claudication: Secondary | ICD-10-CM | POA: Diagnosis not present

## 2020-12-19 DIAGNOSIS — I10 Essential (primary) hypertension: Secondary | ICD-10-CM | POA: Diagnosis not present

## 2020-12-19 DIAGNOSIS — N189 Chronic kidney disease, unspecified: Secondary | ICD-10-CM | POA: Diagnosis not present

## 2020-12-19 DIAGNOSIS — G8191 Hemiplegia, unspecified affecting right dominant side: Secondary | ICD-10-CM | POA: Diagnosis not present

## 2020-12-19 DIAGNOSIS — G8929 Other chronic pain: Secondary | ICD-10-CM

## 2020-12-19 DIAGNOSIS — N179 Acute kidney failure, unspecified: Secondary | ICD-10-CM | POA: Diagnosis not present

## 2020-12-19 DIAGNOSIS — M5441 Lumbago with sciatica, right side: Secondary | ICD-10-CM | POA: Diagnosis not present

## 2020-12-19 DIAGNOSIS — R296 Repeated falls: Secondary | ICD-10-CM

## 2020-12-19 NOTE — Patient Instructions (Addendum)
Health Maintenance Due  Topic Date Due   Zoster Vaccines- Shingrix (1 of 2) Please check with your pharmacy to see if they have the shingrix vaccine. If they do- please get this immunization and update [place] by phone call or mychart with dates you receive the vaccine  Never done   Fecal DNA (Cologuard) Please complete your Cologaurd Never done   OPHTHALMOLOGY EXAM Will get this scheduled.  06/12/2020   COVID-19 Vaccine (4 - Booster for [employer] series) Will get this scheduled.  08/18/2020   Please monitor your left great toenail for any bleeding, redness, or swelling. It seems to be healing well but it you notice any changes or if it worsens, please give [place] a call and we an place a referral to podiatry.  Please continue to stay well hydrated before your visit to nephrology. Please also mention to your nephrologist that there was blood in you urine (hematuria). If you have increased selling in your legs or weight gain, please let your nephrologist know.  I also encourage you to continue with physical therapy.  Renal cyst-MRI of the abdomen was not completed yet-prior Bosniak 2. Ordered 07/08/20- team was to investigate on a prior visit and requested they do so again 11/22/2020 visit and again today- they will reach out to you about this  Recommended follow up: Return in about 2 months (around 02/19/2021).

## 2020-12-19 NOTE — Progress Notes (Signed)
Phone [phone] In person visit   Subjective:   [name] is a 70 y.o. year old very pleasant male patient who presents for/with See problem oriented charting Chief Complaint  Patient presents with   Hypertension   Diabetes   Hospitalization Follow-up    This visit occurred during the SARS-CoV-2 public health emergency.  Safety protocols were in place, including screening questions prior to the visit, additional usage of staff PPE, and extensive cleaning of exam room while observing appropriate contact time as indicated for disinfecting solutions.   Past Medical History-  Patient Active Problem List   Diagnosis Date Noted   Malignant neoplasm of prostate ([place]) 06/09/2019    Priority: High   PAD (peripheral artery disease) ([place]) 01/16/2019    Priority: High   Morbid obesity ([place]) 12/04/2017    Priority: High   Mild cognitive impairment 06/18/2016    Priority: High   Immunosuppressive management encounter following kidney transplant 07/12/2015    Priority: High   Renal transplant recipient 06/27/2015    Priority: High   Immunosuppression ([place]) 06/27/2015    Priority: High   Thoracic aortic aneurysm ([place]) 10/23/2014    Priority: High   Diabetic neuropathy ([place]) 10/14/2010    Priority: High   CAD -s/p DES to marginal vessel at [facility] 2014 04/09/2009    Priority: High   Diabetes mellitus due to underlying condition with diabetic autonomic (poly)neuropathy ([place]) 09/05/2007    Priority: High   Protrusion of lumbar intervertebral disc     Priority: Medium   Spinal stenosis of lumbar region 09/21/2018    Priority: Medium   Hyperlipidemia 01/16/2016    Priority: Medium   Gout 12/22/2009    Priority: Medium   Obstructive sleep apnea 07/10/2009    Priority: Medium   BPH (benign prostatic hyperplasia) 09/25/2008    Priority: Medium   Low back pain 12/15/2007    Priority: Medium   Posttraumatic stress disorder 11/03/2007    Priority: Medium   Essential  hypertension 01/17/2007    Priority: Medium   History of stroke 01/17/2007    Priority: Medium   Right hemiplegia ([place]) 11/22/2020    Priority: Low   GERD (gastroesophageal reflux disease) 05/16/2014    Priority: Low   Trigger thumb of right hand 01/03/2014    Priority: Low   Left foot drop 05/02/2013    Priority: Low   ED (erectile dysfunction) 04/03/2013    Priority: [place] for falls 01/22/2012    Priority: Low   Gastroparesis 06/19/2008    Priority: Low   Allergic rhinitis 03/01/2007    Priority: Low   Acute kidney injury superimposed on chronic kidney disease ([place]) 10/08/2020   Frequent falls 10/08/2020   Stable angina ([place]) 09/16/2020   Pre-operative clearance 06/20/2020   H/O iron deficiency anemia 02/10/2012    Medications- reviewed and updated Current Outpatient Medications  Medication Sig Dispense Refill   allopurinol (ZYLOPRIM) 100 MG tablet Take 200 mg by mouth daily.     AMBULATORY NON FORMULARY MEDICATION [place].  Foot drop M21.372 1 each 0   aspirin 81 MG tablet Take 81 mg by mouth at bedtime.     atorvastatin (LIPITOR) 40 MG tablet Take 40 mg by mouth daily.     clopidogrel (PLAVIX) 75 MG tablet Take 75 mg by mouth daily.     diazepam (VALIUM) 5 MG tablet Take 1 tablet by mouth daily as needed.     diclofenac Sodium (VOLTAREN) 1 % GEL Apply 2  g topically 4 (four) times daily. (Patient taking differently: Apply 2 g topically 4 (four) times daily as needed (pain).) 50 g 0   gabapentin (NEURONTIN) 300 MG capsule TAKE 2 CAPSULES IN MORNING, 1 CAPSULE IN AFTERNOON, AND 2 CAPSULES AT NIGHT. (Patient taking differently: Take 300-600 mg by mouth See admin instructions. Take '600mg'$  in morning, '300mg'$  in afternoon, and '600mg'$  at night.) 150 capsule 0   hydrocortisone cream 1 % Apply 1 application topically 4 (four) times daily as needed for itching.     insulin aspart protamine- aspart (NOVOLOG MIX 70/30) (70-30) 100 UNIT/ML injection Inject 0.35 mLs (35 Units  total) into the skin 2 (two) times daily with a meal. 10 mL 11   Insulin Syringe-Needle U-100 (B-D INS SYR ULTRAFINE 1CC/31G) 31G X 5/16" 1 ML MISC Used to give insulin injections twice daily. 100 each 11   Magnesium 200 MG TABS Take 200 mg by mouth every evening.      mycophenolate (MYFORTIC) 180 MG EC tablet Take 180-360 mg by mouth See admin instructions. Take 2 tablets ('360mg'$ ) by mouth in the morning and 1 tablet ('180mg'$ ) by mouth at night.     nitroGLYCERIN (NITROSTAT) 0.4 MG SL tablet PLACE 1 TABLET (0.4 MG TOTAL) UNDER THE TONGUE EVERY 5 (FIVE) MINUTES AS NEEDED FOR CHEST PAIN. 25 tablet 2   nortriptyline (PAMELOR) 50 MG capsule TAKE 2 CAPSULES (100 MG TOTAL) BY MOUTH AT BEDTIME. 180 capsule 2   omeprazole (PRILOSEC) 20 MG capsule TAKE 1 CAPSULE (20 MG TOTAL) BY MOUTH 2 (TWO) TIMES DAILY BEFORE A MEAL. 180 capsule 3   Semaglutide,0.25 or 0.'5MG'$ /DOS, (OZEMPIC, 0.25 OR 0.5 MG/DOSE,) 2 MG/1.5ML SOPN Inject 1 mg into the skin 2 (two) times a week. Monday and Friday     tacrolimus (PROGRAF) 5 MG capsule Take 5 mg by mouth 2 (two) times daily.      Vitamin D, Ergocalciferol, (DRISDOL) 1.25 MG (50000 UNIT) CAPS capsule Take 50,000 Units by mouth 2 (two) times a week.     AMBULATORY NON FORMULARY MEDICATION Ankle Foot Orthosis  Foot Drop M21.372 Disp 1 (Patient not taking: No sig reported) 1 each 0   tiZANidine (ZANAFLEX) 2 MG tablet Take 1 tablet (2 mg total) by mouth at bedtime. (Patient not taking: No sig reported) 30 tablet 5   No current facility-administered medications for this visit.     Objective:  BP 119/74   Pulse 95   Temp 98.9 F (37.2 C) (Temporal)   Ht '5\' 10"'$  (1.778 m)   SpO2 95%   BMI 34.16 kg/m  Gen: NAD, resting comfortably CV: RRR no murmurs rubs or gallops Lungs: CTAB no crackles, wheeze, rhonchi Abdomen: soft/nontender/nondistended/normal bowel sounds. No rebound or guarding.  Ext: trace  edema R> L Skin: warm, dry Neuro: wheelchair bound- needs assist getting sock  off left foot- healing left great toe after toenail removal. Patient was unable to stand with assist with nursing staff for weight    Assessment and Plan    # ED F/U for Syncope, Collapse, and Back Pain #generalized weakness S:Patient presented to the ED 12/09/2020 for an episode of syncope (hot and patient has not been drinking fluids- he tells me today was feeling really tired in bed and had called EMT- they were on way out and he tried to get out of bed and he passed out- they helped him get up when he got there -orthostatic history). On the other hand ED notes report-He stated that he had gotten out  of the shower and was seated and then passed out.[name] Pertinent negatives were no chest pain, abdominal pain, or headaches.  Patient also recent had a fall  6-12 hours prior to the visit. EKG was done and found normal sinus rhythm with left axis deviation, low voltage QRS with nonspecific T wave abnormality. Labs largely reassuring- troponin mildly elevated but stable trend at 23 x2. Possible blood in urine  6-10 RBC- encouraged him to follow up with nephrology  Abdomen and Pelvis CT without contrast Adrenal/Urinary Tract findings: The adrenal glands are unremarkable. Native bilateral kidneys are atrophic. Right lower quadrant renal transplant. Two simple cysts in the renal transplant measuring up to 2.0 cm.  Stomach/Bowel Findings: Mild colonic diverticulosis  Vascular/Lymphatic: Aortic atherosclerosis. No enlarged abdominal or pelvic lymph nodes  Reproductive: Moderate prostatomegaly  All other findings were within normal limits.  Head CT without contrast Brain: Mild atrophic changes are again seen. No findings to suggest acute hemorrhage, acute infarction or space-occupying mass lesion are seen  Sinuses/Orbits: Mucosal thickening is noted within the maxillary antra bilaterally stable in appearance from the prior exam  Cervical:  Mild facet hypertrophic changes are seen. Disc space narrowing  is noted at C5-6 with mild osteophytic change.  Soft tissues and spinal canal: Surrounding soft tissue structures show vascular calcifications.  All other findings were within normal limits.  Left Shoulder DG Mild degenerative disease about the shoulder again seen.Otherwise, no acute abnormalities or fractures or dislocation.  Patient was found to be dehydrated and was given 1 L of fluids. Dr. Roderic Palau spoke with nephrology and they planned to follow-up with patient within a couple of days with blood work to evaluate kidney function with GFR down to 24. Patient reports  has visit scheduled with nephrology next week- he has increased water intake- labs already scheduled.   Today, reports feels very tired overall and having a lot of trouble with the left leg lifting it- this has been a chronic issue but feels like it is worse- history of right hemiplegia from prior stroke. No evidence of hemorrhage or obvious old storke on CT head 12/10/20. Having a lot of falls in general. From MRI lumbar spine 06/12/20 "2. Right L5 and S1 neural impingement at the foramen and subarticular recess due to degenerative disease and conjoined root sleeves. 3. L4-5 facet osteoarthritis with mild anterolisthesis."  He is having a lot of right low back pain- 3 weeks of pain- already present when he arrived at the hospital last visit. Working with PT every other day. They have made several suggestions but he does not have finances for this- [facility], bedside commode raised. He feels like the bathroom and bedroom are may issues- if he can get rails and commode raised that would help  A/P: Patient sounds to have recent orthostatic hypotension and dehydration leading to syncopal episode.  Thankfully CT of the head without acute bleed or obvious new stroke.  Patient reports weakness sensation of the left leg-could be coming from lumbar spine issues as above-this is on top of baseline right hemiplegia after  prior stroke.  No recurrent issues in regards to syncope.  Patient with echocardiogram May 2021-from cardiology note " Echocardiogram during the admission showed EF greater than 75%, severe LVH, grade 1 DD"patient had stress test with cardiology in April which had a small area of ischemia that was stable from prior exam-medical therapy only recommended-I do not think patient needs further cardiac work-up at this time unless  recurrent issues-he has had none since hydrating better.  On the other hand he is off Lasix and does have some edema-instructed him to watch closely-we may need to either reduce fluid intake or restart Lasix perhaps that less frequent interval.  I am concerned about patient's ability to remain in his home-he is working with the [place] to get some equipment which should be lower cost than if we provided him new Rx-I encouraged him to follow-up closely with the [place]. Patient reports having difficulty with daughter and others in the home- he reports missing $ and other issues. He believes he is going to get a single level home with the [place] with 24 hour nursing care- I am not sure that is a realistic expectation -we have tried social work referral within [facility] which was not approved  In regards to acute on chronic renal disease- encourage dhim to keep follow up with nephrology- also wrote on avs and discussed with patient discussing hematuria to see fi they would suggest urology follow up (history prostate cancer but also with kidney transplant)   #Left great toenail- reports had this trimmed but there was concern it could fall off- he states he later removed that- wound is slowly healing over time. Thinks this happened in June. No purulent material or surrounding erythema.  We discussed warning signs  #History of frequent falls from neuropathy along with right hemiplegia, short-term memory issues, generalized weakness -unclear long-term living condition- he had not been feeling as supportive as he  would like in his house by family. -we tried to get social work support through [employer] - this was unsuccessful -patient has been working with OT and PT -at least twice a week -had been trying secure housing through the [place] -planned to update wheelchair for patient and send to [facility] for right hemiplegia -weakness could be related to not eating regularly (also causing low sugars) -patient is chair bound and needs daughter to assist for most travels - she has an autistic 72 y.o. and is working - doing her best to help -has a Marine scientist 10-2 to help and some nights as well - 3 nights per week  #hypertension S: medication: lasix 40 mg- states this was stopped in ER due to dehydration (denies increased edema since that time). Couldn't weight today due to weakness but he was 226 this AM which is down some BP Readings from Last 3 Encounters:  12/19/20 119/74  12/10/20 (!) 170/103  11/22/20 138/82  A/P: well controlled on no meds- patient is to watch his weight and edeam and let us know if worsening or nephrology- may need intermittent lasix. Upcoming labs through nephrology already set up and patient prefers to hold off on labs today  # Diabetes- scheduled visit next week S: Medication Novolog 70/30 (70-30) 100 unit/mL injection twice daily and Ozempic 1 mg on Mondays and Fridays- poor control with a1c 9.9 last check- encouraged close follow up with endocrinology  Reports money missing   #Renal cyst-MRI of the abdomen was not completed yet-prior Bosniak 2. Ordered 07/08/20- team was to investigate on a prior visit and requested they do so again 11/22/2020 visit-planned to reach out to patient directly. Last imaged 03/22/2019- asked team again today  -apparently patient had scheduled 4x and cancelled- we will attempt one more time   Recommended follow up: Return in about 2 months (around 02/19/2021). Future Appointments  Date Time Provider [place]  01/03/2021  3:45 PM Almyra Deforest, [place]  [facility]  02/17/2021  1:40 PM [name], MD [facility]  03/07/2021  2:30 PM Pieter Partridge, DO [facility] None  07/28/2021  2:30 PM [facility] HEALTH COACH [facility]    Lab/Order associations:   ICD-10-CM   1. Right hemiplegia ([place])  G81.91     2. Frequent falls  R29.6       No orders of the defined types were placed in this encounter.  I,Harris Phan,acting as a Education administrator for Garret Reddish, MD.,have documented all relevant documentation on the behalf of Garret Reddish, MD,as directed by  Garret Reddish, MD while in the presence of Garret Reddish, MD.  I, Garret Reddish, MD, have reviewed all documentation for this visit. The documentation on 12/19/20 for the exam, diagnosis, procedures, and orders are all accurate and complete.  Time Spent: 43 minutes of total time (3:55 PM- 4:28 PM, 7:37 PM-7:47 PM-) was spent on the date of the encounter performing the following actions: chart review prior to seeing the patient, obtaining history, performing a medically necessary exam, counseling on the treatment plan and my concerns to both son and patient about plan that patient thinks is going to occur thorugh the [place],  and documenting in our EHR.    Return precautions advised.  Garret Reddish, MD

## 2020-12-21 ENCOUNTER — Other Ambulatory Visit: Payer: Self-pay | Admitting: Neurology

## 2020-12-23 DIAGNOSIS — I70209 Unspecified atherosclerosis of native arteries of extremities, unspecified extremity: Secondary | ICD-10-CM | POA: Diagnosis not present

## 2020-12-23 DIAGNOSIS — M542 Cervicalgia: Secondary | ICD-10-CM | POA: Diagnosis not present

## 2020-12-23 DIAGNOSIS — I69354 Hemiplegia and hemiparesis following cerebral infarction affecting left non-dominant side: Secondary | ICD-10-CM | POA: Diagnosis not present

## 2020-12-23 DIAGNOSIS — R2681 Unsteadiness on feet: Secondary | ICD-10-CM | POA: Diagnosis not present

## 2020-12-23 DIAGNOSIS — D849 Immunodeficiency, unspecified: Secondary | ICD-10-CM | POA: Diagnosis not present

## 2020-12-23 DIAGNOSIS — E559 Vitamin D deficiency, unspecified: Secondary | ICD-10-CM | POA: Diagnosis not present

## 2020-12-23 DIAGNOSIS — N1831 Chronic kidney disease, stage 3a: Secondary | ICD-10-CM | POA: Diagnosis not present

## 2020-12-23 DIAGNOSIS — I129 Hypertensive chronic kidney disease with stage 1 through stage 4 chronic kidney disease, or unspecified chronic kidney disease: Secondary | ICD-10-CM | POA: Diagnosis not present

## 2020-12-23 DIAGNOSIS — R296 Repeated falls: Secondary | ICD-10-CM | POA: Diagnosis not present

## 2020-12-23 DIAGNOSIS — E1142 Type 2 diabetes mellitus with diabetic polyneuropathy: Secondary | ICD-10-CM | POA: Diagnosis not present

## 2020-12-30 ENCOUNTER — Telehealth: Payer: Self-pay

## 2020-12-30 DIAGNOSIS — M542 Cervicalgia: Secondary | ICD-10-CM | POA: Diagnosis not present

## 2020-12-30 DIAGNOSIS — I129 Hypertensive chronic kidney disease with stage 1 through stage 4 chronic kidney disease, or unspecified chronic kidney disease: Secondary | ICD-10-CM | POA: Diagnosis not present

## 2020-12-30 DIAGNOSIS — R2681 Unsteadiness on feet: Secondary | ICD-10-CM | POA: Diagnosis not present

## 2020-12-30 DIAGNOSIS — I69354 Hemiplegia and hemiparesis following cerebral infarction affecting left non-dominant side: Secondary | ICD-10-CM | POA: Diagnosis not present

## 2020-12-30 DIAGNOSIS — E559 Vitamin D deficiency, unspecified: Secondary | ICD-10-CM | POA: Diagnosis not present

## 2020-12-30 DIAGNOSIS — R296 Repeated falls: Secondary | ICD-10-CM | POA: Diagnosis not present

## 2020-12-30 DIAGNOSIS — D849 Immunodeficiency, unspecified: Secondary | ICD-10-CM | POA: Diagnosis not present

## 2020-12-30 DIAGNOSIS — N1831 Chronic kidney disease, stage 3a: Secondary | ICD-10-CM | POA: Diagnosis not present

## 2020-12-30 DIAGNOSIS — E1142 Type 2 diabetes mellitus with diabetic polyneuropathy: Secondary | ICD-10-CM | POA: Diagnosis not present

## 2020-12-30 DIAGNOSIS — E118 Type 2 diabetes mellitus with unspecified complications: Secondary | ICD-10-CM | POA: Diagnosis not present

## 2020-12-30 DIAGNOSIS — I70209 Unspecified atherosclerosis of native arteries of extremities, unspecified extremity: Secondary | ICD-10-CM | POA: Diagnosis not present

## 2020-12-30 NOTE — Telephone Encounter (Signed)
Leroy Sea is calling in from [place] to report the patient had a fall this morning and 8/6. No injuries, just bruising. Wanted to notify us.

## 2020-12-31 ENCOUNTER — Telehealth: Payer: Self-pay

## 2020-12-31 NOTE — Telephone Encounter (Signed)
Spoke with brad he states that [name] isn't having in issues related to the fall, Advised him if he does then to make sure he schedules an appointment to see [place]. Gave a verbal understanding

## 2020-12-31 NOTE — Telephone Encounter (Signed)
Patient's daughter called and stated that Hollan had a bad fall last night. She stated that she had to call the fire department to come get him up. She would like to speak to a nurse about getting a beside toilet for [place]. Can someone call her back at [phone]

## 2021-01-01 NOTE — Telephone Encounter (Signed)
Can you call pt daughter and see where she wants the Rx faxed and write it on [facility] Rx pad and fax it to wherever she wants it to go.

## 2021-01-01 NOTE — Telephone Encounter (Signed)
Patients daughter called and states that [name] isn't able to use his legs, He can't stand on his own without the support of two people. His weight is basically dead weight. [name] is using the bathroom on hisself multiple times during the day. His daughter is very concerned about her dad she thinks that he is in the early stages of Dementia, Sometimes he's okay but most times he doesn't know where he is, what day it is and who certain people are. Patient's daughter states that [name] thought that she was her sister today and was asking her about herself. He's asking her about what time he has to go to work he hasn't worked in over 15 years. She wants a bedside commode for him and also she also wants an appointment for him to be seen. She states that he is also getting up out the bed on his own and continues to fall because he can't hold himself up

## 2021-01-02 NOTE — Telephone Encounter (Signed)
We are getting multiple notes about patient declining PT/OT. He needs to complete these. Fine with sending in bedside commode under right hemiplegia but if he needs 2 person support he is still likely to fall unless has that support. I do not think its safe for him to continue to live at home. Can home health social work give [place] an update on long term strategies for patient?  We could also look at [facility] (he would lose me as PCP) a program where if he has a caregiver at home they can provide more extensive care with a goal to keep him out of a nursing facility for custodial care but I suspect he is going to need this long term unless we do something differently

## 2021-01-03 ENCOUNTER — Ambulatory Visit: Payer: Medicare Other | Admitting: Physician Assistant

## 2021-01-03 ENCOUNTER — Telehealth: Payer: Self-pay | Admitting: Cardiology

## 2021-01-03 NOTE — Telephone Encounter (Signed)
Patient's daughter requesting a nurse call her back to discuss the patient's care. She states the patient is in early stages of dementia and she is having to take over managing his appointments and care.

## 2021-01-03 NOTE — Telephone Encounter (Signed)
Spoke with patient's daughter Arline Asp and son [name] (both ok per DPR). Pt's daughter reports she has begun taking care of her father and wants to make sure she understands who his providers are and when he is to see them. Nurse reports that the pt's physician was recommended to be Dr. Stanford Breed by Almyra Deforest, PA on his 08/21/2020 visit. Nurse advised pt's daughter pt has an appointment with Jory Sims 02/28/2021 at 1:45pm. Offered pt's daughter option to move to an appointment with Dr. Stanford Breed 10/18, pt's daughter would prefer her father keep the earlier appointment.   Pt's daughter asked how she can send the office her father's blood pressure readings and EKGs taken at home. Nurse advised pt's daughter she may send the readings via MyChart message or physically bring them to office/fax them in for review. Pt's daughter reports she will try MyChart.   Pt's daughter was also wanting to know who to see in order to help her father get medical equipment he needs, advised pt's daughter call pt's primary care physician Dr. Yong Channel. Pt's daughter verbalized understanding.   Nurse let pt's daughter know if her father has any non-emergent cardiac concerns she may call the office at [phone] or send a MyChart message and for any emergency concerns she should call 911 or have him taken to the emergency room. Pt's daughter verbalized understanding, all questions/concerns addressed at this time.

## 2021-01-03 NOTE — Telephone Encounter (Signed)
How Do I Enroll? To discuss whether [facility] is right for you or a family member, call us today at [phone]. We will visit your home to assess your needs, answer your questions and explain our services.

## 2021-01-03 NOTE — Telephone Encounter (Signed)
Patients daughter calling to f/u states she hasn't heard anything would like a call back at [facility] [phone].

## 2021-01-03 NOTE — Telephone Encounter (Signed)
She said that she's not putting her father in a facility at all. She said that she would rather try the [facility] program. How would I go about getting this started for him ?

## 2021-01-06 DIAGNOSIS — D849 Immunodeficiency, unspecified: Secondary | ICD-10-CM | POA: Diagnosis not present

## 2021-01-06 DIAGNOSIS — I129 Hypertensive chronic kidney disease with stage 1 through stage 4 chronic kidney disease, or unspecified chronic kidney disease: Secondary | ICD-10-CM | POA: Diagnosis not present

## 2021-01-06 DIAGNOSIS — M542 Cervicalgia: Secondary | ICD-10-CM | POA: Diagnosis not present

## 2021-01-06 DIAGNOSIS — R2681 Unsteadiness on feet: Secondary | ICD-10-CM | POA: Diagnosis not present

## 2021-01-06 DIAGNOSIS — E1142 Type 2 diabetes mellitus with diabetic polyneuropathy: Secondary | ICD-10-CM | POA: Diagnosis not present

## 2021-01-06 DIAGNOSIS — R296 Repeated falls: Secondary | ICD-10-CM | POA: Diagnosis not present

## 2021-01-06 DIAGNOSIS — N1831 Chronic kidney disease, stage 3a: Secondary | ICD-10-CM | POA: Diagnosis not present

## 2021-01-06 DIAGNOSIS — E559 Vitamin D deficiency, unspecified: Secondary | ICD-10-CM | POA: Diagnosis not present

## 2021-01-06 DIAGNOSIS — I69354 Hemiplegia and hemiparesis following cerebral infarction affecting left non-dominant side: Secondary | ICD-10-CM | POA: Diagnosis not present

## 2021-01-06 DIAGNOSIS — I70209 Unspecified atherosclerosis of native arteries of extremities, unspecified extremity: Secondary | ICD-10-CM | POA: Diagnosis not present

## 2021-01-08 NOTE — Telephone Encounter (Signed)
See below [employer].

## 2021-01-08 NOTE — Telephone Encounter (Signed)
Patients daughter has already spoken with [facility] and begin the enrollment process for [name]. They won't be able to get him in until October 1st but she is okay with that. I gave her our best wishes and she thanked [place] for all we have done. She states that she will give [place] a call if she needed anything else from [place].

## 2021-01-13 ENCOUNTER — Telehealth: Payer: Self-pay

## 2021-01-13 ENCOUNTER — Emergency Department (HOSPITAL_COMMUNITY): Payer: Medicare Other

## 2021-01-13 ENCOUNTER — Encounter (HOSPITAL_COMMUNITY): Payer: Self-pay

## 2021-01-13 ENCOUNTER — Other Ambulatory Visit: Payer: Self-pay

## 2021-01-13 ENCOUNTER — Inpatient Hospital Stay (HOSPITAL_COMMUNITY)
Admission: EM | Admit: 2021-01-13 | Discharge: 2021-01-29 | DRG: 698 | Disposition: A | Discharge status: Skilled Nursing Facility | Payer: Medicare Other | Attending: Internal Medicine | Admitting: Internal Medicine

## 2021-01-13 DIAGNOSIS — I2510 Atherosclerotic heart disease of native coronary artery without angina pectoris: Secondary | ICD-10-CM | POA: Diagnosis not present

## 2021-01-13 DIAGNOSIS — Z940 Kidney transplant status: Secondary | ICD-10-CM

## 2021-01-13 DIAGNOSIS — Z20822 Contact with and (suspected) exposure to covid-19: Secondary | ICD-10-CM | POA: Diagnosis present

## 2021-01-13 DIAGNOSIS — I447 Left bundle-branch block, unspecified: Secondary | ICD-10-CM | POA: Diagnosis present

## 2021-01-13 DIAGNOSIS — N1832 Chronic kidney disease, stage 3b: Secondary | ICD-10-CM | POA: Diagnosis not present

## 2021-01-13 DIAGNOSIS — E8889 Other specified metabolic disorders: Secondary | ICD-10-CM | POA: Diagnosis present

## 2021-01-13 DIAGNOSIS — Z955 Presence of coronary angioplasty implant and graft: Secondary | ICD-10-CM

## 2021-01-13 DIAGNOSIS — I69354 Hemiplegia and hemiparesis following cerebral infarction affecting left non-dominant side: Secondary | ICD-10-CM | POA: Diagnosis not present

## 2021-01-13 DIAGNOSIS — F1390 Sedative, hypnotic, or anxiolytic use, unspecified, uncomplicated: Secondary | ICD-10-CM | POA: Diagnosis present

## 2021-01-13 DIAGNOSIS — G4733 Obstructive sleep apnea (adult) (pediatric): Secondary | ICD-10-CM | POA: Diagnosis present

## 2021-01-13 DIAGNOSIS — Z841 Family history of disorders of kidney and ureter: Secondary | ICD-10-CM

## 2021-01-13 DIAGNOSIS — R5381 Other malaise: Secondary | ICD-10-CM | POA: Diagnosis not present

## 2021-01-13 DIAGNOSIS — I959 Hypotension, unspecified: Secondary | ICD-10-CM | POA: Diagnosis present

## 2021-01-13 DIAGNOSIS — E041 Nontoxic single thyroid nodule: Secondary | ICD-10-CM

## 2021-01-13 DIAGNOSIS — Z515 Encounter for palliative care: Secondary | ICD-10-CM | POA: Diagnosis not present

## 2021-01-13 DIAGNOSIS — Z7189 Other specified counseling: Secondary | ICD-10-CM | POA: Diagnosis not present

## 2021-01-13 DIAGNOSIS — N183 Chronic kidney disease, stage 3 unspecified: Secondary | ICD-10-CM | POA: Diagnosis present

## 2021-01-13 DIAGNOSIS — R410 Disorientation, unspecified: Secondary | ICD-10-CM | POA: Diagnosis not present

## 2021-01-13 DIAGNOSIS — E870 Hyperosmolality and hypernatremia: Secondary | ICD-10-CM | POA: Diagnosis present

## 2021-01-13 DIAGNOSIS — N390 Urinary tract infection, site not specified: Secondary | ICD-10-CM

## 2021-01-13 DIAGNOSIS — I130 Hypertensive heart and chronic kidney disease with heart failure and stage 1 through stage 4 chronic kidney disease, or unspecified chronic kidney disease: Secondary | ICD-10-CM | POA: Diagnosis not present

## 2021-01-13 DIAGNOSIS — K219 Gastro-esophageal reflux disease without esophagitis: Secondary | ICD-10-CM | POA: Diagnosis present

## 2021-01-13 DIAGNOSIS — E1122 Type 2 diabetes mellitus with diabetic chronic kidney disease: Secondary | ICD-10-CM | POA: Diagnosis not present

## 2021-01-13 DIAGNOSIS — Z8249 Family history of ischemic heart disease and other diseases of the circulatory system: Secondary | ICD-10-CM

## 2021-01-13 DIAGNOSIS — T8619 Other complication of kidney transplant: Principal | ICD-10-CM | POA: Diagnosis present

## 2021-01-13 DIAGNOSIS — Z66 Do not resuscitate: Secondary | ICD-10-CM | POA: Diagnosis not present

## 2021-01-13 DIAGNOSIS — I5032 Chronic diastolic (congestive) heart failure: Secondary | ICD-10-CM

## 2021-01-13 DIAGNOSIS — Z794 Long term (current) use of insulin: Secondary | ICD-10-CM

## 2021-01-13 DIAGNOSIS — E785 Hyperlipidemia, unspecified: Secondary | ICD-10-CM | POA: Diagnosis present

## 2021-01-13 DIAGNOSIS — F061 Catatonic disorder due to known physiological condition: Secondary | ICD-10-CM | POA: Diagnosis present

## 2021-01-13 DIAGNOSIS — Z79899 Other long term (current) drug therapy: Secondary | ICD-10-CM

## 2021-01-13 DIAGNOSIS — I160 Hypertensive urgency: Secondary | ICD-10-CM | POA: Diagnosis present

## 2021-01-13 DIAGNOSIS — B9620 Unspecified Escherichia coli [E. coli] as the cause of diseases classified elsewhere: Secondary | ICD-10-CM | POA: Diagnosis not present

## 2021-01-13 DIAGNOSIS — Z8546 Personal history of malignant neoplasm of prostate: Secondary | ICD-10-CM

## 2021-01-13 DIAGNOSIS — G9341 Metabolic encephalopathy: Secondary | ICD-10-CM | POA: Diagnosis not present

## 2021-01-13 DIAGNOSIS — E860 Dehydration: Secondary | ICD-10-CM | POA: Diagnosis not present

## 2021-01-13 DIAGNOSIS — E1140 Type 2 diabetes mellitus with diabetic neuropathy, unspecified: Secondary | ICD-10-CM | POA: Diagnosis not present

## 2021-01-13 DIAGNOSIS — R000 Tachycardia, unspecified: Secondary | ICD-10-CM | POA: Diagnosis present

## 2021-01-13 DIAGNOSIS — Z833 Family history of diabetes mellitus: Secondary | ICD-10-CM

## 2021-01-13 DIAGNOSIS — Z8673 Personal history of transient ischemic attack (TIA), and cerebral infarction without residual deficits: Secondary | ICD-10-CM | POA: Diagnosis not present

## 2021-01-13 DIAGNOSIS — R531 Weakness: Secondary | ICD-10-CM | POA: Diagnosis not present

## 2021-01-13 DIAGNOSIS — B9689 Other specified bacterial agents as the cause of diseases classified elsewhere: Secondary | ICD-10-CM | POA: Diagnosis not present

## 2021-01-13 DIAGNOSIS — N179 Acute kidney failure, unspecified: Secondary | ICD-10-CM | POA: Diagnosis not present

## 2021-01-13 DIAGNOSIS — M109 Gout, unspecified: Secondary | ICD-10-CM | POA: Diagnosis present

## 2021-01-13 DIAGNOSIS — G9340 Encephalopathy, unspecified: Secondary | ICD-10-CM | POA: Diagnosis present

## 2021-01-13 DIAGNOSIS — R509 Fever, unspecified: Secondary | ICD-10-CM

## 2021-01-13 DIAGNOSIS — F419 Anxiety disorder, unspecified: Secondary | ICD-10-CM

## 2021-01-13 DIAGNOSIS — Y92009 Unspecified place in unspecified non-institutional (private) residence as the place of occurrence of the external cause: Secondary | ICD-10-CM

## 2021-01-13 DIAGNOSIS — R296 Repeated falls: Secondary | ICD-10-CM | POA: Diagnosis present

## 2021-01-13 DIAGNOSIS — Y830 Surgical operation with transplant of whole organ as the cause of abnormal reaction of the patient, or of later complication, without mention of misadventure at the time of the procedure: Secondary | ICD-10-CM | POA: Diagnosis present

## 2021-01-13 DIAGNOSIS — Z801 Family history of malignant neoplasm of trachea, bronchus and lung: Secondary | ICD-10-CM

## 2021-01-13 DIAGNOSIS — D72819 Decreased white blood cell count, unspecified: Secondary | ICD-10-CM | POA: Diagnosis not present

## 2021-01-13 DIAGNOSIS — Z7982 Long term (current) use of aspirin: Secondary | ICD-10-CM

## 2021-01-13 DIAGNOSIS — Z7902 Long term (current) use of antithrombotics/antiplatelets: Secondary | ICD-10-CM

## 2021-01-13 DIAGNOSIS — Z823 Family history of stroke: Secondary | ICD-10-CM

## 2021-01-13 DIAGNOSIS — E1129 Type 2 diabetes mellitus with other diabetic kidney complication: Secondary | ICD-10-CM | POA: Diagnosis present

## 2021-01-13 DIAGNOSIS — W19XXXA Unspecified fall, initial encounter: Secondary | ICD-10-CM | POA: Diagnosis present

## 2021-01-13 LAB — COMPREHENSIVE METABOLIC PANEL
ALT: 11 U/L (ref 0–44)
AST: 16 U/L (ref 15–41)
Albumin: 2.6 g/dL — ABNORMAL LOW (ref 3.5–5.0)
Alkaline Phosphatase: 61 U/L (ref 38–126)
Anion gap: 10 (ref 5–15)
BUN: 57 mg/dL — ABNORMAL HIGH (ref 8–23)
CO2: 26 mmol/L (ref 22–32)
Calcium: 8 mg/dL — ABNORMAL LOW (ref 8.9–10.3)
Chloride: 105 mmol/L (ref 98–111)
Creatinine, Ser: 3.7 mg/dL — ABNORMAL HIGH (ref 0.61–1.24)
GFR, Estimated: 17 mL/min — ABNORMAL LOW (ref >60)
Glucose, Bld: 206 mg/dL — ABNORMAL HIGH (ref 70–99)
Potassium: 3.8 mmol/L (ref 3.5–5.1)
Sodium: 141 mmol/L (ref 135–145)
Total Bilirubin: 0.7 mg/dL (ref 0.3–1.2)
Total Protein: 6 g/dL — ABNORMAL LOW (ref 6.5–8.1)

## 2021-01-13 LAB — RESP PANEL BY RT-PCR (FLU A&B, COVID) ARPGX2
Influenza A by PCR: NEGATIVE
Influenza B by PCR: NEGATIVE
SARS Coronavirus 2 by RT PCR: NEGATIVE

## 2021-01-13 LAB — BLOOD GAS, VENOUS
Acid-base deficit: 4.3 mmol/L — ABNORMAL HIGH (ref 0.0–2.0)
Bicarbonate: 20.3 mmol/L (ref 20.0–28.0)
O2 Saturation: 41.5 %
Patient temperature: 98.6
pCO2, Ven: 37.6 mmHg — ABNORMAL LOW (ref 44.0–60.0)
pH, Ven: 7.352 (ref 7.250–7.430)
pO2, Ven: 28.4 mmHg — CL (ref 32.0–45.0)

## 2021-01-13 LAB — CBC WITH DIFFERENTIAL/PLATELET
Abs Immature Granulocytes: 0.02 10*3/uL (ref 0.00–0.07)
Basophils Absolute: 0 10*3/uL (ref 0.0–0.1)
Basophils Relative: 0 %
Eosinophils Absolute: 0.1 10*3/uL (ref 0.0–0.5)
Eosinophils Relative: 1 %
HCT: 39.4 % (ref 39.0–52.0)
Hemoglobin: 11.9 g/dL — ABNORMAL LOW (ref 13.0–17.0)
Immature Granulocytes: 0 %
Lymphocytes Relative: 6 %
Lymphs Abs: 0.3 10*3/uL — ABNORMAL LOW (ref 0.7–4.0)
MCH: 27.2 pg (ref 26.0–34.0)
MCHC: 30.2 g/dL (ref 30.0–36.0)
MCV: 90 fL (ref 80.0–100.0)
Monocytes Absolute: 0.5 10*3/uL (ref 0.1–1.0)
Monocytes Relative: 10 %
Neutro Abs: 4.1 10*3/uL (ref 1.7–7.7)
Neutrophils Relative %: 83 %
Platelets: 177 10*3/uL (ref 150–400)
RBC: 4.38 MIL/uL (ref 4.22–5.81)
RDW: 14.9 % (ref 11.5–15.5)
WBC: 5 10*3/uL (ref 4.0–10.5)
nRBC: 0 % (ref 0.0–0.2)

## 2021-01-13 LAB — URINALYSIS, COMPLETE (UACMP) WITH MICROSCOPIC
Bacteria, UA: NONE SEEN
Bilirubin Urine: NEGATIVE
Glucose, UA: NEGATIVE mg/dL
Hgb urine dipstick: NEGATIVE
Ketones, ur: 5 mg/dL — AB
Leukocytes,Ua: NEGATIVE
Nitrite: NEGATIVE
Protein, ur: NEGATIVE mg/dL
Specific Gravity, Urine: 1.012 (ref 1.005–1.030)
pH: 6 (ref 5.0–8.0)

## 2021-01-13 LAB — RAPID URINE DRUG SCREEN, HOSP PERFORMED
Amphetamines: NOT DETECTED
Barbiturates: NOT DETECTED
Benzodiazepines: POSITIVE — AB
Cocaine: NOT DETECTED
Opiates: NOT DETECTED
Tetrahydrocannabinol: NOT DETECTED

## 2021-01-13 LAB — BETA-HYDROXYBUTYRIC ACID: Beta-Hydroxybutyric Acid: 0.14 mmol/L (ref 0.05–0.27)

## 2021-01-13 LAB — ETHANOL: Alcohol, Ethyl (B): <10 mg/dL (ref <10)

## 2021-01-13 LAB — AMMONIA: Ammonia: 12 umol/L (ref 9–35)

## 2021-01-13 MED ORDER — SODIUM CHLORIDE 0.9 % IV BOLUS
1000.0000 mL | Freq: Once | INTRAVENOUS | Status: AC
  Administered 2021-01-13: 1000 mL via INTRAVENOUS

## 2021-01-13 NOTE — ED Triage Notes (Signed)
EMS reports from home, daughter called for increased weakness and AMS x 2 days. Left sided weakness and memory issues from prior stroke. Daughter gave coverage insulin before transport.    BP 134/94 HR 106 RR 18 Sp02 98 RA CBG 366  18ga L wrist 529m NS enroute

## 2021-01-13 NOTE — Telephone Encounter (Signed)
[name] has reached out to pt daughter.

## 2021-01-13 NOTE — Telephone Encounter (Signed)
Patients daughter states that she got him transferred to [facility]. She states that her dad wasn't himself today, wasn't really talking to her or answering her questions. His Blood sugars has been normal but when EMS got to the house his blood pressure was low. Is systolic number was 89 she can't remember the dystolic number. When EMS came they gave him IV's and before they could get him into transport he was almost finished with IV, patient's daughter thinks that he was very dehydrated. She states that she has been giving him water and smoothies but he hasn't been interested in eating or drinking anything. Again she states that she is beginning the process to get him enrolled into [facility]. This is just a FYI she wants you to be aware of what's going on with her father.
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
## 2021-01-13 NOTE — ED Notes (Signed)
Yvetta Coder is the patients daughter and would like an update. [phone]

## 2021-01-13 NOTE — Telephone Encounter (Signed)
Patients daughter called and stated that since yesterday the patient has been lethargic she is very worried about her father. Patient daughter is having him transported to [place] to be evaluated. Patient daughter would like a CMA to call her back at [phone].

## 2021-01-13 NOTE — Telephone Encounter (Signed)
Thanks for update- glad they called to get him evaluated

## 2021-01-13 NOTE — ED Notes (Signed)
Patients daughter would like a call back. Yvetta Coder [phone]

## 2021-01-13 NOTE — ED Notes (Signed)
Please call  Pt son [name] with any updates. [phone]. Pt does not have a POA but is okay with you speaking with his son with updates.

## 2021-01-14 ENCOUNTER — Encounter (HOSPITAL_COMMUNITY): Payer: Self-pay | Admitting: Family Medicine

## 2021-01-14 DIAGNOSIS — G9341 Metabolic encephalopathy: Secondary | ICD-10-CM | POA: Diagnosis present

## 2021-01-14 DIAGNOSIS — N390 Urinary tract infection, site not specified: Secondary | ICD-10-CM | POA: Diagnosis not present

## 2021-01-14 DIAGNOSIS — N179 Acute kidney failure, unspecified: Secondary | ICD-10-CM | POA: Diagnosis present

## 2021-01-14 DIAGNOSIS — R1312 Dysphagia, oropharyngeal phase: Secondary | ICD-10-CM | POA: Diagnosis not present

## 2021-01-14 DIAGNOSIS — Z20822 Contact with and (suspected) exposure to covid-19: Secondary | ICD-10-CM | POA: Diagnosis not present

## 2021-01-14 DIAGNOSIS — I959 Hypotension, unspecified: Secondary | ICD-10-CM | POA: Diagnosis not present

## 2021-01-14 DIAGNOSIS — E870 Hyperosmolality and hypernatremia: Secondary | ICD-10-CM | POA: Diagnosis not present

## 2021-01-14 DIAGNOSIS — G9340 Encephalopathy, unspecified: Secondary | ICD-10-CM | POA: Diagnosis not present

## 2021-01-14 DIAGNOSIS — I160 Hypertensive urgency: Secondary | ICD-10-CM | POA: Diagnosis present

## 2021-01-14 DIAGNOSIS — E1129 Type 2 diabetes mellitus with other diabetic kidney complication: Secondary | ICD-10-CM | POA: Diagnosis present

## 2021-01-14 DIAGNOSIS — Z66 Do not resuscitate: Secondary | ICD-10-CM | POA: Diagnosis not present

## 2021-01-14 DIAGNOSIS — E1140 Type 2 diabetes mellitus with diabetic neuropathy, unspecified: Secondary | ICD-10-CM | POA: Diagnosis present

## 2021-01-14 DIAGNOSIS — G8191 Hemiplegia, unspecified affecting right dominant side: Secondary | ICD-10-CM | POA: Diagnosis not present

## 2021-01-14 DIAGNOSIS — Z955 Presence of coronary angioplasty implant and graft: Secondary | ICD-10-CM | POA: Diagnosis not present

## 2021-01-14 DIAGNOSIS — B9620 Unspecified Escherichia coli [E. coli] as the cause of diseases classified elsewhere: Secondary | ICD-10-CM | POA: Diagnosis not present

## 2021-01-14 DIAGNOSIS — Z7401 Bed confinement status: Secondary | ICD-10-CM | POA: Diagnosis not present

## 2021-01-14 DIAGNOSIS — Y830 Surgical operation with transplant of whole organ as the cause of abnormal reaction of the patient, or of later complication, without mention of misadventure at the time of the procedure: Secondary | ICD-10-CM | POA: Diagnosis present

## 2021-01-14 DIAGNOSIS — G459 Transient cerebral ischemic attack, unspecified: Secondary | ICD-10-CM | POA: Diagnosis not present

## 2021-01-14 DIAGNOSIS — F419 Anxiety disorder, unspecified: Secondary | ICD-10-CM | POA: Diagnosis present

## 2021-01-14 DIAGNOSIS — I2510 Atherosclerotic heart disease of native coronary artery without angina pectoris: Secondary | ICD-10-CM | POA: Diagnosis not present

## 2021-01-14 DIAGNOSIS — M6281 Muscle weakness (generalized): Secondary | ICD-10-CM | POA: Diagnosis not present

## 2021-01-14 DIAGNOSIS — R278 Other lack of coordination: Secondary | ICD-10-CM | POA: Diagnosis not present

## 2021-01-14 DIAGNOSIS — Z8673 Personal history of transient ischemic attack (TIA), and cerebral infarction without residual deficits: Secondary | ICD-10-CM | POA: Diagnosis not present

## 2021-01-14 DIAGNOSIS — R531 Weakness: Secondary | ICD-10-CM | POA: Diagnosis not present

## 2021-01-14 DIAGNOSIS — Z515 Encounter for palliative care: Secondary | ICD-10-CM | POA: Diagnosis not present

## 2021-01-14 DIAGNOSIS — E860 Dehydration: Secondary | ICD-10-CM | POA: Diagnosis not present

## 2021-01-14 DIAGNOSIS — N183 Chronic kidney disease, stage 3 unspecified: Secondary | ICD-10-CM | POA: Diagnosis not present

## 2021-01-14 DIAGNOSIS — I130 Hypertensive heart and chronic kidney disease with heart failure and stage 1 through stage 4 chronic kidney disease, or unspecified chronic kidney disease: Secondary | ICD-10-CM | POA: Diagnosis not present

## 2021-01-14 DIAGNOSIS — D72819 Decreased white blood cell count, unspecified: Secondary | ICD-10-CM | POA: Diagnosis not present

## 2021-01-14 DIAGNOSIS — N1832 Chronic kidney disease, stage 3b: Secondary | ICD-10-CM | POA: Diagnosis present

## 2021-01-14 DIAGNOSIS — R410 Disorientation, unspecified: Secondary | ICD-10-CM | POA: Diagnosis not present

## 2021-01-14 DIAGNOSIS — Y92009 Unspecified place in unspecified non-institutional (private) residence as the place of occurrence of the external cause: Secondary | ICD-10-CM | POA: Diagnosis not present

## 2021-01-14 DIAGNOSIS — F061 Catatonic disorder due to known physiological condition: Secondary | ICD-10-CM | POA: Diagnosis present

## 2021-01-14 DIAGNOSIS — E041 Nontoxic single thyroid nodule: Secondary | ICD-10-CM | POA: Diagnosis present

## 2021-01-14 DIAGNOSIS — Z940 Kidney transplant status: Secondary | ICD-10-CM | POA: Diagnosis not present

## 2021-01-14 DIAGNOSIS — T8619 Other complication of kidney transplant: Secondary | ICD-10-CM | POA: Diagnosis not present

## 2021-01-14 DIAGNOSIS — Z7189 Other specified counseling: Secondary | ICD-10-CM | POA: Diagnosis not present

## 2021-01-14 DIAGNOSIS — W19XXXA Unspecified fall, initial encounter: Secondary | ICD-10-CM | POA: Diagnosis present

## 2021-01-14 DIAGNOSIS — M109 Gout, unspecified: Secondary | ICD-10-CM | POA: Diagnosis present

## 2021-01-14 DIAGNOSIS — E1122 Type 2 diabetes mellitus with diabetic chronic kidney disease: Secondary | ICD-10-CM | POA: Diagnosis not present

## 2021-01-14 DIAGNOSIS — E8889 Other specified metabolic disorders: Secondary | ICD-10-CM | POA: Diagnosis not present

## 2021-01-14 DIAGNOSIS — R5381 Other malaise: Secondary | ICD-10-CM | POA: Diagnosis not present

## 2021-01-14 DIAGNOSIS — I5032 Chronic diastolic (congestive) heart failure: Secondary | ICD-10-CM | POA: Diagnosis not present

## 2021-01-14 DIAGNOSIS — I69354 Hemiplegia and hemiparesis following cerebral infarction affecting left non-dominant side: Secondary | ICD-10-CM | POA: Diagnosis not present

## 2021-01-14 LAB — COMPREHENSIVE METABOLIC PANEL
ALT: 12 U/L (ref 0–44)
AST: 17 U/L (ref 15–41)
Albumin: 3.2 g/dL — ABNORMAL LOW (ref 3.5–5.0)
Alkaline Phosphatase: 72 U/L (ref 38–126)
Anion gap: 8 (ref 5–15)
BUN: 44 mg/dL — ABNORMAL HIGH (ref 8–23)
CO2: 29 mmol/L (ref 22–32)
Calcium: 9.7 mg/dL (ref 8.9–10.3)
Chloride: 102 mmol/L (ref 98–111)
Creatinine, Ser: 3.01 mg/dL — ABNORMAL HIGH (ref 0.61–1.24)
GFR, Estimated: 21 mL/min — ABNORMAL LOW (ref >60)
Glucose, Bld: 192 mg/dL — ABNORMAL HIGH (ref 70–99)
Potassium: 4.2 mmol/L (ref 3.5–5.1)
Sodium: 139 mmol/L (ref 135–145)
Total Bilirubin: 0.9 mg/dL (ref 0.3–1.2)
Total Protein: 7.1 g/dL (ref 6.5–8.1)

## 2021-01-14 LAB — CBC WITH DIFFERENTIAL/PLATELET
Abs Immature Granulocytes: 0.01 10*3/uL (ref 0.00–0.07)
Basophils Absolute: 0 10*3/uL (ref 0.0–0.1)
Basophils Relative: 0 %
Eosinophils Absolute: 0.1 10*3/uL (ref 0.0–0.5)
Eosinophils Relative: 2 %
HCT: 49 % (ref 39.0–52.0)
Hemoglobin: 15.1 g/dL (ref 13.0–17.0)
Immature Granulocytes: 0 %
Lymphocytes Relative: 6 %
Lymphs Abs: 0.3 10*3/uL — ABNORMAL LOW (ref 0.7–4.0)
MCH: 27.1 pg (ref 26.0–34.0)
MCHC: 31 g/dL (ref 30.0–36.0)
MCV: 87.5 fL (ref 80.0–100.0)
Monocytes Absolute: 0.4 10*3/uL (ref 0.1–1.0)
Monocytes Relative: 8 %
Neutro Abs: 4.2 10*3/uL (ref 1.7–7.7)
Neutrophils Relative %: 84 %
Platelets: 215 10*3/uL (ref 150–400)
RBC: 5.6 MIL/uL (ref 4.22–5.81)
RDW: 15 % (ref 11.5–15.5)
WBC: 5.1 10*3/uL (ref 4.0–10.5)
nRBC: 0 % (ref 0.0–0.2)

## 2021-01-14 LAB — GLUCOSE, CAPILLARY
Glucose-Capillary: 184 mg/dL — ABNORMAL HIGH (ref 70–99)
Glucose-Capillary: 128 mg/dL — ABNORMAL HIGH (ref 70–99)
Glucose-Capillary: 131 mg/dL — ABNORMAL HIGH (ref 70–99)

## 2021-01-14 LAB — HEMOGLOBIN A1C
Hgb A1c MFr Bld: 8.2 % — ABNORMAL HIGH (ref 4.8–5.6)
Mean Plasma Glucose: 188.64 mg/dL

## 2021-01-14 LAB — CBG MONITORING, ED: Glucose-Capillary: 178 mg/dL — ABNORMAL HIGH (ref 70–99)

## 2021-01-14 MED ORDER — MYCOPHENOLATE SODIUM 180 MG PO TBEC: 180.0000 mg | DELAYED_RELEASE_TABLET | ORAL | Status: DC

## 2021-01-14 MED ORDER — ATORVASTATIN CALCIUM 40 MG PO TABS
40.0000 mg | ORAL_TABLET | Freq: Every day | ORAL | Status: DC
  Administered 2021-01-14 – 2021-01-29 (×16): 40 mg via ORAL
  Filled 2021-01-14 (×16): qty 1

## 2021-01-14 MED ORDER — ACETAMINOPHEN 650 MG RE SUPP: 650.0000 mg | Freq: Four times a day (QID) | RECTAL | Status: DC | PRN

## 2021-01-14 MED ORDER — INSULIN ASPART 100 UNIT/ML IJ SOLN
0.0000 [IU] | Freq: Three times a day (TID) | INTRAMUSCULAR | Status: DC
  Administered 2021-01-14: 2 [IU] via SUBCUTANEOUS
  Administered 2021-01-14 (×2): 3 [IU] via SUBCUTANEOUS
  Administered 2021-01-15 (×2): 2 [IU] via SUBCUTANEOUS
  Administered 2021-01-15 – 2021-01-17 (×5): 3 [IU] via SUBCUTANEOUS
  Administered 2021-01-17: 2 [IU] via SUBCUTANEOUS
  Administered 2021-01-17 – 2021-01-18 (×2): 3 [IU] via SUBCUTANEOUS
  Administered 2021-01-18: 5 [IU] via SUBCUTANEOUS
  Administered 2021-01-18: 8 [IU] via SUBCUTANEOUS
  Administered 2021-01-19: 5 [IU] via SUBCUTANEOUS
  Administered 2021-01-19: 3 [IU] via SUBCUTANEOUS
  Administered 2021-01-19: 5 [IU] via SUBCUTANEOUS
  Administered 2021-01-20 (×2): 3 [IU] via SUBCUTANEOUS
  Administered 2021-01-21 (×3): 5 [IU] via SUBCUTANEOUS
  Administered 2021-01-22: 3 [IU] via SUBCUTANEOUS
  Administered 2021-01-22: 2 [IU] via SUBCUTANEOUS
  Administered 2021-01-22 – 2021-01-23 (×2): 5 [IU] via SUBCUTANEOUS
  Administered 2021-01-23: 3 [IU] via SUBCUTANEOUS
  Administered 2021-01-23 – 2021-01-24 (×2): 5 [IU] via SUBCUTANEOUS
  Administered 2021-01-24: 3 [IU] via SUBCUTANEOUS
  Administered 2021-01-24: 5 [IU] via SUBCUTANEOUS
  Administered 2021-01-25 – 2021-01-26 (×6): 3 [IU] via SUBCUTANEOUS
  Administered 2021-01-27: 5 [IU] via SUBCUTANEOUS
  Administered 2021-01-27: 2 [IU] via SUBCUTANEOUS
  Administered 2021-01-27 – 2021-01-28 (×2): 3 [IU] via SUBCUTANEOUS
  Administered 2021-01-28 (×2): 5 [IU] via SUBCUTANEOUS
  Administered 2021-01-29: 3 [IU] via SUBCUTANEOUS
  Administered 2021-01-29: 5 [IU] via SUBCUTANEOUS
  Filled 2021-01-14: qty 0.15

## 2021-01-14 MED ORDER — ACETAMINOPHEN 325 MG PO TABS
650.0000 mg | ORAL_TABLET | Freq: Four times a day (QID) | ORAL | Status: DC | PRN
  Administered 2021-01-16 – 2021-01-28 (×3): 650 mg via ORAL
  Filled 2021-01-14 (×2): qty 2

## 2021-01-14 MED ORDER — SODIUM CHLORIDE 0.9 % IV SOLN: INTRAVENOUS | Status: DC

## 2021-01-14 MED ORDER — NORTRIPTYLINE HCL 25 MG PO CAPS
100.0000 mg | ORAL_CAPSULE | Freq: Every day | ORAL | Status: DC
  Administered 2021-01-14 – 2021-01-28 (×14): 100 mg via ORAL
  Filled 2021-01-14 (×15): qty 4

## 2021-01-14 MED ORDER — CLONIDINE HCL 0.1 MG PO TABS
0.1000 mg | ORAL_TABLET | Freq: Once | ORAL | Status: AC
  Administered 2021-01-14: 0.1 mg via ORAL
  Filled 2021-01-14: qty 1

## 2021-01-14 MED ORDER — MYCOPHENOLATE SODIUM 180 MG PO TBEC
180.0000 mg | DELAYED_RELEASE_TABLET | Freq: Every day | ORAL | Status: DC
  Administered 2021-01-14 – 2021-01-28 (×14): 180 mg via ORAL
  Filled 2021-01-14 (×15): qty 1

## 2021-01-14 MED ORDER — ASPIRIN EC 81 MG PO TBEC
81.0000 mg | DELAYED_RELEASE_TABLET | Freq: Every day | ORAL | Status: DC
  Administered 2021-01-14 – 2021-01-23 (×10): 81 mg via ORAL
  Filled 2021-01-14 (×10): qty 1

## 2021-01-14 MED ORDER — MYCOPHENOLATE SODIUM 180 MG PO TBEC
360.0000 mg | DELAYED_RELEASE_TABLET | Freq: Every day | ORAL | Status: DC
  Administered 2021-01-14 – 2021-01-29 (×16): 360 mg via ORAL
  Filled 2021-01-14 (×16): qty 2

## 2021-01-14 MED ORDER — METOPROLOL TARTRATE 5 MG/5ML IV SOLN
5.0000 mg | Freq: Four times a day (QID) | INTRAVENOUS | Status: DC | PRN
  Administered 2021-01-14: 5 mg via INTRAVENOUS
  Filled 2021-01-14: qty 5

## 2021-01-14 MED ORDER — AMLODIPINE BESYLATE 10 MG PO TABS
10.0000 mg | ORAL_TABLET | Freq: Every day | ORAL | Status: DC
  Administered 2021-01-14 – 2021-01-29 (×16): 10 mg via ORAL
  Filled 2021-01-14 (×5): qty 1
  Filled 2021-01-14 (×2): qty 2
  Filled 2021-01-14: qty 1
  Filled 2021-01-14 (×2): qty 2
  Filled 2021-01-14 (×7): qty 1

## 2021-01-14 MED ORDER — DIAZEPAM 5 MG PO TABS: 5.0000 mg | ORAL_TABLET | Freq: Every day | ORAL | Status: DC | PRN

## 2021-01-14 MED ORDER — CLOPIDOGREL BISULFATE 75 MG PO TABS
75.0000 mg | ORAL_TABLET | Freq: Every day | ORAL | Status: DC
  Administered 2021-01-14 – 2021-01-29 (×16): 75 mg via ORAL
  Filled 2021-01-14 (×16): qty 1

## 2021-01-14 MED ORDER — TACROLIMUS 1 MG PO CAPS
5.0000 mg | ORAL_CAPSULE | Freq: Two times a day (BID) | ORAL | Status: DC
  Administered 2021-01-14 – 2021-01-29 (×30): 5 mg via ORAL
  Filled 2021-01-14 (×32): qty 5

## 2021-01-14 MED ORDER — PANTOPRAZOLE SODIUM 40 MG PO TBEC
40.0000 mg | DELAYED_RELEASE_TABLET | Freq: Every day | ORAL | Status: DC
  Administered 2021-01-14 – 2021-01-29 (×16): 40 mg via ORAL
  Filled 2021-01-14 (×16): qty 1

## 2021-01-14 NOTE — Evaluation (Signed)
Physical Therapy Evaluation Patient Details Name: [name] MRN: [id number] DOB: [birth year] Today's Date: 01/14/2021   History of Present Illness  70 yo male admitted with encephalopathy, AKI, hypertensive urgency. Hx of CHF, frequent falls, chronic L sided weakness, diabetic neuropathy, prostate ca, MMI, CKD, renal transplant, CVA  Clinical Impression  Bed level eval only. Pt was lethargic-intermittent increased alertness with confusion and irrelevant responses. He did not follow 1 step commands consistently. Unable to safely attempt mobility with +1 assist on today. Will follow and progress activity as safely able. No family present during session. Recommendation at this time is for SNF.     Follow Up Recommendations SNF;Supervision/Assistance - 24 hour    Equipment Recommendations  None recommended by PT    Recommendations for Other Services       Precautions / Restrictions Precautions Precautions: Fall Precaution Comments: frequent falls per chart Restrictions Weight Bearing Restrictions: No      Mobility  Bed Mobility               General bed mobility comments: Bed level eval only. Pt had difficulty following 1 step commands. He also had difficulty keeping eyes open. Unable to safely attempt mobility with +1 assist on today.    Transfers                    Ambulation/Gait                Stairs            Wheelchair Mobility    Modified Rankin (Stroke Patients Only)       Balance Overall balance assessment: History of Falls                                           Pertinent Vitals/Pain Pain Assessment: Faces Faces Pain Scale: No hurt    Home Living Family/patient expects to be discharged to:: Private residence   Available Help at Discharge: Family;Available 24 hours/day;Personal care attendant (daughter-works from home?; aide 3x/week) Type of Home: House Home Access: Level entry     Home Layout:  Multi-level Home Equipment: Grab bars - toilet;Grab bars - tub/shower;Shower seat;Cane - single point;Walker - 4 wheels Additional Comments: Drive into 1st floor (garage and bedroom), 2nd level has foyer, 3rd level (kitchen/pt's bed/bath); pt enters home from back deck with bil handrails from ground up to 2nd level    Prior Function Level of Independence: Needs assistance   Gait / Transfers Assistance Needed: pt reports household ambulator with rollator, falls "just about" everyday and stepson assists with getting up  ADL's / Homemaking Assistance Needed: family assisting with LB dressing, bathes without assistance, family completes cooking, cleaning, driving  Comments: info taken from previous admission     Hand Dominance   Dominant Hand: Right    Extremity/Trunk Assessment   Upper Extremity Assessment Upper Extremity Assessment: Difficult to assess due to impaired cognition    Lower Extremity Assessment Lower Extremity Assessment: Difficult to assess due to impaired cognition       Communication   Communication: No difficulties  Cognition Arousal/Alertness: Lethargic Behavior During Therapy: WFL for tasks assessed/performed Overall Cognitive Status: No family/caregiver present to determine baseline cognitive functioning Area of Impairment: Orientation;Attention;Following commands;Problem solving                 Orientation Level: Disoriented to;Situation;Time  Following Commands: Follows one step commands inconsistently     Problem Solving: Slow processing;Decreased initiation;Difficulty sequencing;Requires verbal cues;Requires tactile cues General Comments: pt have difficulty keeping eyes open      General Comments      Exercises     Assessment/Plan    PT Assessment Patient needs continued PT services  PT Problem List Decreased strength;Decreased mobility;Decreased activity tolerance;Decreased balance;Decreased range of motion;Decreased  cognition;Decreased safety awareness;Decreased knowledge of use of DME       PT Treatment Interventions DME instruction;Gait training;Therapeutic exercise;Balance training;Functional mobility training;Therapeutic activities;Patient/family education    PT Goals (Current goals can be found in the Care Plan section)  Acute Rehab PT Goals Patient Stated Goal: none stated PT Goal Formulation: Patient unable to participate in goal setting Time For Goal Achievement: 01/28/21 Potential to Achieve Goals: Fair    Frequency Min 3X/week   Barriers to discharge        Co-evaluation               AM-PAC PT "6 Clicks" Mobility  Outcome Measure Help needed turning from your back to your side while in a flat bed without using bedrails?: Total Help needed moving from lying on your back to sitting on the side of a flat bed without using bedrails?: Total Help needed moving to and from a bed to a chair (including a wheelchair)?: Total Help needed standing up from a chair using your arms (e.g., wheelchair or bedside chair)?: Total Help needed to walk in hospital room?: Total Help needed climbing 3-5 steps with a railing? : Total 6 Click Score: 6    End of Session   Activity Tolerance: Patient limited by lethargy Patient left: in bed;with call bell/phone within reach;with bed alarm set   PT Visit Diagnosis: Repeated falls (R29.6);History of falling (Z91.81);Muscle weakness (generalized) (M62.81);Difficulty in walking, not elsewhere classified (R26.2)    Time: [id number] PT Time Calculation (min) (ACUTE ONLY): 8 min   Charges:   PT Evaluation $PT Eval Moderate Complexity: 1 Mod             Doreatha Massed, PT Acute Rehabilitation  Office: [phone] Pager: [phone]

## 2021-01-14 NOTE — ED Notes (Signed)
Unable to obtain orthostatic vital signs due to pt being unable to stand.
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
## 2021-01-14 NOTE — ED Notes (Signed)
Messaged provider MD. Marlowe Sax regarding patients BP of 186/111.

## 2021-01-14 NOTE — ED Notes (Signed)
Messaged provider MD. Marlowe Sax regarding patients elevated BP once again as well as called and left a call back number.

## 2021-01-15 DIAGNOSIS — G9341 Metabolic encephalopathy: Secondary | ICD-10-CM

## 2021-01-15 LAB — COMPREHENSIVE METABOLIC PANEL
ALT: 11 U/L (ref 0–44)
AST: 14 U/L — ABNORMAL LOW (ref 15–41)
Albumin: 2.9 g/dL — ABNORMAL LOW (ref 3.5–5.0)
Alkaline Phosphatase: 65 U/L (ref 38–126)
Anion gap: 11 (ref 5–15)
BUN: 43 mg/dL — ABNORMAL HIGH (ref 8–23)
CO2: 25 mmol/L (ref 22–32)
Calcium: 9.5 mg/dL (ref 8.9–10.3)
Chloride: 106 mmol/L (ref 98–111)
Creatinine, Ser: 2.34 mg/dL — ABNORMAL HIGH (ref 0.61–1.24)
GFR, Estimated: 29 mL/min — ABNORMAL LOW (ref >60)
Glucose, Bld: 136 mg/dL — ABNORMAL HIGH (ref 70–99)
Potassium: 3.9 mmol/L (ref 3.5–5.1)
Sodium: 142 mmol/L (ref 135–145)
Total Bilirubin: 1 mg/dL (ref 0.3–1.2)
Total Protein: 6.5 g/dL (ref 6.5–8.1)

## 2021-01-15 LAB — GLUCOSE, CAPILLARY
Glucose-Capillary: 133 mg/dL — ABNORMAL HIGH (ref 70–99)
Glucose-Capillary: 132 mg/dL — ABNORMAL HIGH (ref 70–99)
Glucose-Capillary: 158 mg/dL — ABNORMAL HIGH (ref 70–99)
Glucose-Capillary: 149 mg/dL — ABNORMAL HIGH (ref 70–99)

## 2021-01-15 LAB — CBC
HCT: 45.2 % (ref 39.0–52.0)
Hemoglobin: 13.7 g/dL (ref 13.0–17.0)
MCH: 26.7 pg (ref 26.0–34.0)
MCHC: 30.3 g/dL (ref 30.0–36.0)
MCV: 88.1 fL (ref 80.0–100.0)
Platelets: 218 10*3/uL (ref 150–400)
RBC: 5.13 MIL/uL (ref 4.22–5.81)
RDW: 15 % (ref 11.5–15.5)
WBC: 3.5 10*3/uL — ABNORMAL LOW (ref 4.0–10.5)
nRBC: 0 % (ref 0.0–0.2)

## 2021-01-15 MED ORDER — HEPARIN SODIUM (PORCINE) 5000 UNIT/ML IJ SOLN
5000.0000 [IU] | Freq: Three times a day (TID) | INTRAMUSCULAR | Status: DC
  Administered 2021-01-15 – 2021-01-29 (×43): 5000 [IU] via SUBCUTANEOUS
  Filled 2021-01-15 (×42): qty 1
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
## 2021-01-15 NOTE — Progress Notes (Signed)
I checked in with patient about notarizing AD.  He was on the phone and the paperwork was only partially filled out.  When I returned he was sleeping.  If it does not get notarized tonight, one of [place] will check in tomorrow.  [place], Bcc Pager, [phone] 5:49 PM

## 2021-01-15 NOTE — Discharge Instructions (Signed)
[facility] info-Provider [facility] [place] [phone]

## 2021-01-15 NOTE — NC FL2 (Signed)
[place] LEVEL OF CARE SCREENING TOOL     IDENTIFICATION  Patient Name: [name] Birthdate: [birth year] Sex: male Admission Date (Current Location): 01/13/2021  [facility] and [place] Number:  Herbalist and Address:  [facility],  [place] [address] [place]      Provider Number: [id number]  Attending Physician Name and Address:  Antonieta Pert, MD  Relative Name and Phone Number:  Itzel Schilling (son) [phone]    Current Level of Care: Hospital Recommended Level of Care: [place] Prior Approval Number:    Date Approved/Denied:   PASRR Number: [id number]  Discharge Plan: SNF    Current Diagnoses: Patient Active Problem List   Diagnosis Date Noted   Acute encephalopathy [id number]   Acute metabolic encephalopathy A999333   CKD (chronic kidney disease), stage III ([place]) 01/14/2021   Hypertensive urgency 01/14/2021   DM (diabetes mellitus) type II controlled with renal manifestation ([place]) 01/14/2021   AKI (acute kidney injury) ([place]) 01/13/2021   Right hemiplegia ([place]) 11/22/2020   Acute kidney injury superimposed on chronic kidney disease ([place]) 10/08/2020   Frequent falls 10/08/2020   Stable angina ([place]) 09/16/2020   Pre-operative clearance 06/20/2020   Protrusion of lumbar intervertebral disc    Malignant neoplasm of prostate ([place]) 06/09/2019   PAD (peripheral artery disease) ([place]) 01/16/2019   Spinal stenosis of lumbar region 09/21/2018   Morbid obesity ([place]) 12/04/2017   Mild cognitive impairment 06/18/2016   Hyperlipidemia 01/16/2016   Immunosuppressive management encounter following kidney transplant 07/12/2015   Renal transplant recipient 06/27/2015   Immunosuppression ([place]) 06/27/2015   Thoracic aortic aneurysm ([place]) 10/23/2014   GERD (gastroesophageal reflux disease) 05/16/2014   Trigger thumb of right hand 01/03/2014   Left foot drop 05/02/2013   ED (erectile dysfunction) 04/03/2013   H/O  iron deficiency anemia 02/10/2012   Risk for falls 01/22/2012   Diabetic neuropathy ([place]) 10/14/2010   Gout 12/22/2009   Obstructive sleep apnea 07/10/2009   CAD -s/p DES to marginal vessel at [facility] 2014 04/09/2009   BPH (benign prostatic hyperplasia) 09/25/2008   Gastroparesis 06/19/2008   Low back pain 12/15/2007   Posttraumatic stress disorder 11/03/2007   Diabetes mellitus due to underlying condition with diabetic autonomic (poly)neuropathy ([place]) 09/05/2007   Allergic rhinitis 03/01/2007   Essential hypertension 01/17/2007   History of stroke 01/17/2007    Orientation RESPIRATION BLADDER Height & Weight     Self, Time, Situation, Place  Normal Continent Weight: 93.7 kg Height:  5' 10.5" (179.1 cm)  BEHAVIORAL SYMPTOMS/MOOD NEUROLOGICAL BOWEL NUTRITION STATUS      Continent Diet (CHO MOD)  AMBULATORY STATUS COMMUNICATION OF NEEDS Skin   Limited Assist Verbally Normal                       Personal Care Assistance Level of Assistance  Bathing, Feeding, Dressing Bathing Assistance: Limited assistance Feeding assistance: Limited assistance Dressing Assistance: Limited assistance     Functional Limitations Info  Sight, Hearing, Speech Sight Info: Impaired (eyeglasses) Hearing Info: Adequate Speech Info: Impaired (Dentures-top/bottom)    SPECIAL CARE FACTORS FREQUENCY  PT (By licensed PT), OT (By licensed OT)     PT Frequency:  (5x week) OT Frequency:  (5x week)            Contractures Contractures Info: Not present    Additional Factors Info  Code Status, Allergies, Insulin Sliding Scale Code Status Info:  (Full) Allergies Info:  (Codeine)  Insulin Sliding Scale Info:  (SSI see MAR)       Current Medications (01/15/2021):  This is the current hospital active medication list Current Facility-Administered Medications  Medication Dose Route Frequency Provider Last Rate Last Admin   0.9 %  sodium chloride infusion   Intravenous Continuous [place],  Einar Pheasant, MD 75 mL/hr at 01/15/21 1001 Infusion Verify at 01/15/21 1001   acetaminophen (TYLENOL) tablet 650 mg  650 mg Oral Q6H PRN Howington, Einar Pheasant, MD       Or   acetaminophen (TYLENOL) suppository 650 mg  650 mg Rectal Q6H PRN Howington, Einar Pheasant, MD       amLODipine (NORVASC) tablet 10 mg  10 mg Oral Daily Howington, Einar Pheasant, MD   10 mg at 01/15/21 [id number]   aspirin EC tablet 81 mg  81 mg Oral QHS Howington, Einar Pheasant, MD   81 mg at 01/14/21 2303   atorvastatin (LIPITOR) tablet 40 mg  40 mg Oral Daily Howington, Einar Pheasant, MD   40 mg at 01/15/21 [id number]   clopidogrel (PLAVIX) tablet 75 mg  75 mg Oral Daily Howington, Einar Pheasant, MD   75 mg at 01/15/21 0955   diazepam (VALIUM) tablet 5 mg  5 mg Oral Daily PRN Howington, Einar Pheasant, MD       heparin injection 5,000 Units  5,000 Units Subcutaneous Q8H Kc, Ramesh, MD       insulin aspart (novoLOG) injection 0-15 Units  0-15 Units Subcutaneous TID WC Howington, Einar Pheasant, MD   2 Units at 01/15/21 0752   metoprolol tartrate (LOPRESSOR) injection 5 mg  5 mg Intravenous Q6H PRN Howington, Einar Pheasant, MD   5 mg at 01/14/21 0805   mycophenolate (MYFORTIC) EC tablet 360 mg  360 mg Oral Daily Howington, Einar Pheasant, MD   360 mg at 01/15/21 0955   And   mycophenolate (MYFORTIC) EC tablet 180 mg  180 mg Oral Q2200 Jacqlyn Krauss, MD   180 mg at 01/14/21 2307   nortriptyline (PAMELOR) capsule 100 mg  100 mg Oral QHS Howington, Einar Pheasant, MD   100 mg at 01/14/21 2308   pantoprazole (PROTONIX) EC tablet 40 mg  40 mg Oral Daily Howington, Einar Pheasant, MD   40 mg at 01/15/21 0956   tacrolimus (PROGRAF) capsule 5 mg  5 mg Oral BID Howington, Einar Pheasant, MD   5 mg at 01/15/21 [id number]     Discharge Medications: Please see discharge summary for a list of discharge medications.  Relevant Imaging Results:  Relevant Lab Results:   Additional Information Covid vaccine x 3 SS# [id number]  Dessa Phi, RN

## 2021-01-15 NOTE — Progress Notes (Signed)
Chaplain engaged in an initial visit with [name] and his son, [name].  Chaplain provided education on Advanced Directive paperwork.  [name] noted that he is going to be assigned as [employer].  Chaplain went over process and to have her paged when they are ready to have that paperwork notarized.      01/15/21 1400  Clinical Encounter Type  Visited With Patient and family together  Visit Type Social support

## 2021-01-15 NOTE — Plan of Care (Signed)
  Problem: Nutrition: Goal: Adequate nutrition will be maintained Outcome: Progressing   Problem: Activity: Goal: Risk for activity intolerance will decrease Outcome: Progressing

## 2021-01-16 LAB — BASIC METABOLIC PANEL
Anion gap: 10 (ref 5–15)
BUN: 33 mg/dL — ABNORMAL HIGH (ref 8–23)
CO2: 25 mmol/L (ref 22–32)
Calcium: 9.5 mg/dL (ref 8.9–10.3)
Chloride: 105 mmol/L (ref 98–111)
Creatinine, Ser: 1.83 mg/dL — ABNORMAL HIGH (ref 0.61–1.24)
GFR, Estimated: 39 mL/min — ABNORMAL LOW (ref >60)
Glucose, Bld: 153 mg/dL — ABNORMAL HIGH (ref 70–99)
Potassium: 3.8 mmol/L (ref 3.5–5.1)
Sodium: 140 mmol/L (ref 135–145)

## 2021-01-16 LAB — CBC
HCT: 48.9 % (ref 39.0–52.0)
Hemoglobin: 14.5 g/dL (ref 13.0–17.0)
MCH: 26.5 pg (ref 26.0–34.0)
MCHC: 29.7 g/dL — ABNORMAL LOW (ref 30.0–36.0)
MCV: 89.4 fL (ref 80.0–100.0)
Platelets: 231 10*3/uL (ref 150–400)
RBC: 5.47 MIL/uL (ref 4.22–5.81)
RDW: 14.7 % (ref 11.5–15.5)
WBC: 2.7 10*3/uL — ABNORMAL LOW (ref 4.0–10.5)
nRBC: 0 % (ref 0.0–0.2)

## 2021-01-16 LAB — GLUCOSE, CAPILLARY
Glucose-Capillary: 153 mg/dL — ABNORMAL HIGH (ref 70–99)
Glucose-Capillary: 200 mg/dL — ABNORMAL HIGH (ref 70–99)
Glucose-Capillary: 157 mg/dL — ABNORMAL HIGH (ref 70–99)
Glucose-Capillary: 133 mg/dL — ABNORMAL HIGH (ref 70–99)

## 2021-01-16 LAB — VITAMIN B12: Vitamin B-12: 414 pg/mL (ref 180–914)

## 2021-01-16 NOTE — Progress Notes (Signed)
Chaplain engaged in a follow-up visit to see the status of Advanced Directive document.  Upon follow-up, Chaplain realized that [name] may not be able to complete the healthcare POA.  [name] began talking about a number of things that did not relate to Chaplain's questions.  He had also pulled off his heart monitor.  Chaplain checked in with his nurse who also agreed that [name] was not in a place to complete the document at this time.    Chaplain offered support and can follow-up as needed.    01/16/21 1000  Clinical Encounter Type  Visited With Patient  Visit Type Social support;Follow-up

## 2021-01-16 NOTE — TOC Progression Note (Addendum)
Transition of Care [facility]) - Progression Note    Patient Details  Name: [name] MRN: [id number] Date of Birth: 1950/11/30  [facility] CM/SW Contact  [place] Clos, Juliann Pulse, RN Phone Number: 01/16/2021, 10:34 AM  Clinical Narrative: SNF bed offers given-await choice from [name](son). Received call from APS case worker-Camilla [phone] to assess after 5p today.     Expected Discharge Plan: [place] Barriers to Discharge: Continued Medical Work up  Expected Discharge Plan and Services Expected Discharge Plan: [place]   Discharge Planning Services: CM Consult   Living arrangements for the past 2 months: Single Family Home                                       Social Determinants of Health (SDOH) Interventions    Readmission Risk Interventions No flowsheet data found.

## 2021-01-16 NOTE — NC FL2 (Signed)
[place] LEVEL OF CARE SCREENING TOOL     IDENTIFICATION  Patient Name: [name] Birthdate: 1950/07/20 Sex: male Admission Date (Current Location): 01/13/2021  [facility] and [place] Number:  Herbalist and Address:  [facility],  [place] [address] [place]      Provider Number: [id number]  Attending Physician Name and Address:  Antonieta Pert, MD  Relative Name and Phone Number:  Aundray Batz (son) [phone]    Current Level of Care: Hospital Recommended Level of Care: [place] Prior Approval Number:    Date Approved/Denied:   PASRR Number: [id number]  Discharge Plan: SNF    Current Diagnoses: Patient Active Problem List   Diagnosis Date Noted   Acute encephalopathy [id number]   Acute metabolic encephalopathy A999333   CKD (chronic kidney disease), stage III ([place]) 01/14/2021   Hypertensive urgency 01/14/2021   DM (diabetes mellitus) type II controlled with renal manifestation ([place]) 01/14/2021   AKI (acute kidney injury) ([place]) 01/13/2021   Right hemiplegia ([place]) 11/22/2020   Acute kidney injury superimposed on chronic kidney disease ([place]) 10/08/2020   Frequent falls 10/08/2020   Stable angina ([place]) 09/16/2020   Pre-operative clearance 06/20/2020   Protrusion of lumbar intervertebral disc    Malignant neoplasm of prostate ([place]) 06/09/2019   PAD (peripheral artery disease) ([place]) 01/16/2019   Spinal stenosis of lumbar region 09/21/2018   Morbid obesity ([place]) 12/04/2017   Mild cognitive impairment 06/18/2016   Hyperlipidemia 01/16/2016   Immunosuppressive management encounter following kidney transplant 07/12/2015   Renal transplant recipient 06/27/2015   Immunosuppression ([place]) 06/27/2015   Thoracic aortic aneurysm ([place]) 10/23/2014   GERD (gastroesophageal reflux disease) 05/16/2014   Trigger thumb of right hand 01/03/2014   Left foot drop 05/02/2013   ED (erectile dysfunction) 04/03/2013   H/O  iron deficiency anemia 02/10/2012   Risk for falls 01/22/2012   Diabetic neuropathy ([place]) 10/14/2010   Gout 12/22/2009   Obstructive sleep apnea 07/10/2009   CAD -s/p DES to marginal vessel at [facility] 2014 04/09/2009   BPH (benign prostatic hyperplasia) 09/25/2008   Gastroparesis 06/19/2008   Low back pain 12/15/2007   Posttraumatic stress disorder 11/03/2007   Diabetes mellitus due to underlying condition with diabetic autonomic (poly)neuropathy ([place]) 09/05/2007   Allergic rhinitis 03/01/2007   Essential hypertension 01/17/2007   History of stroke 01/17/2007    Orientation RESPIRATION BLADDER Height & Weight     Self, Time, Situation, Place  Normal Continent Weight: 96.5 kg Height:  5' 10.5" (179.1 cm)  BEHAVIORAL SYMPTOMS/MOOD NEUROLOGICAL BOWEL NUTRITION STATUS      Continent Diet (CHO MOD)  AMBULATORY STATUS COMMUNICATION OF NEEDS Skin   Limited Assist Verbally Normal                       Personal Care Assistance Level of Assistance  Bathing, Feeding, Dressing Bathing Assistance: Limited assistance Feeding assistance: Limited assistance Dressing Assistance: Limited assistance     Functional Limitations Info  Sight, Hearing, Speech Sight Info: Impaired (eyeglasses) Hearing Info: Adequate Speech Info: Impaired (Dentures-top/bottom)    SPECIAL CARE FACTORS FREQUENCY  PT (By licensed PT), OT (By licensed OT)     PT Frequency:  (5x week) OT Frequency:  (5x week)            Contractures Contractures Info: Not present    Additional Factors Info  Code Status, Allergies, Insulin Sliding Scale Code Status Info:  (Full) Allergies Info:  (Codeine)  Insulin Sliding Scale Info:  (SSI see MAR)       Current Medications (01/16/2021):  This is the current hospital active medication list Current Facility-Administered Medications  Medication Dose Route Frequency Provider Last Rate Last Admin   acetaminophen (TYLENOL) tablet 650 mg  650 mg Oral Q6H PRN  Howington, Einar Pheasant, MD       Or   acetaminophen (TYLENOL) suppository 650 mg  650 mg Rectal Q6H PRN Howington, Einar Pheasant, MD       amLODipine (NORVASC) tablet 10 mg  10 mg Oral Daily Howington, Einar Pheasant, MD   10 mg at 01/16/21 [id number]   aspirin EC tablet 81 mg  81 mg Oral QHS Howington, Einar Pheasant, MD   81 mg at 01/15/21 2109   atorvastatin (LIPITOR) tablet 40 mg  40 mg Oral Daily Howington, Einar Pheasant, MD   40 mg at 01/16/21 [id number]   clopidogrel (PLAVIX) tablet 75 mg  75 mg Oral Daily Howington, Einar Pheasant, MD   75 mg at 01/16/21 [id number]   diazepam (VALIUM) tablet 5 mg  5 mg Oral Daily PRN Howington, Einar Pheasant, MD       heparin injection 5,000 Units  5,000 Units Subcutaneous Q8H Kc, Ramesh, MD   5,000 Units at 01/16/21 1406   insulin aspart (novoLOG) injection 0-15 Units  0-15 Units Subcutaneous TID WC Howington, Einar Pheasant, MD   3 Units at 01/16/21 1200   metoprolol tartrate (LOPRESSOR) injection 5 mg  5 mg Intravenous Q6H PRN Howington, Einar Pheasant, MD   5 mg at 01/14/21 0805   mycophenolate (MYFORTIC) EC tablet 360 mg  360 mg Oral Daily Howington, Einar Pheasant, MD   360 mg at 01/16/21 [id number]   And   mycophenolate (MYFORTIC) EC tablet 180 mg  180 mg Oral Q2200 Howington, Einar Pheasant, MD   180 mg at 01/15/21 2117   nortriptyline (PAMELOR) capsule 100 mg  100 mg Oral QHS Howington, Einar Pheasant, MD   100 mg at 01/15/21 2110   pantoprazole (PROTONIX) EC tablet 40 mg  40 mg Oral Daily Howington, Einar Pheasant, MD   40 mg at 01/16/21 [id number]   tacrolimus (PROGRAF) capsule 5 mg  5 mg Oral BID Howington, Einar Pheasant, MD   5 mg at 01/16/21 [id number]     Discharge Medications: Please see discharge summary for a list of discharge medications.  Relevant Imaging Results:  Relevant Lab Results:   Additional Information Covid vaccine x 3 SS# [id number]  Dessa Phi, RN

## 2021-01-16 NOTE — Progress Notes (Signed)
[name] has called operator and department several times this afternoon requesting information regarding patient. Patient is set up as confidential and patient privacy was supported. No information was provided to callers over phone. [name] did mention several times that "my father is in room 1411, I know he is there". To support confidentiality patient was moved to a different room. Patient stated understanding for moving to a new room.
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
## 2021-01-16 NOTE — Evaluation (Signed)
Occupational Therapy Evaluation Patient Details Name: [name] MRN: [id number] DOB: Nov 02, 1950 Today's Date: 01/16/2021    History of Present Illness 70 yo male admitted with encephalopathy, AKI, hypertensive urgency. Hx of CHF, frequent falls, chronic L sided weakness, diabetic neuropathy, prostate ca, MMI, CKD, renal transplant, CVA   Clinical Impression   Per chart review patient lives with daughter who works from home, also has care giver 3x/week. History of frequent fall using walker for ambulation, has assist for lower body ADLs at baseline. Currently patient unable to state where he is, month, oriented to year only. Also presents with slow processing needing significant multimodal cues to follow through with directions during mobility. Patient max A for trunk support to sitting. Needing 3 attempts, bed height elevated and mod A with cues for hand placement + use momentum to power up to standing. Poor safety with walker while turning to sit in recliner pushing too far forward. Patient is currently high fall risk and needing significant assist with basic transfers and self care. Acute OT to follow to facilitate D/C to venue listed below.    Follow Up Recommendations  SNF    Equipment Recommendations  None recommended by OT       Precautions / Restrictions Precautions Precautions: Fall Precaution Comments: frequent falls per chart Restrictions Weight Bearing Restrictions: No      Mobility Bed Mobility Overal bed mobility: Needs Assistance Bed Mobility: Rolling;Sidelying to Sit Rolling: Min assist Sidelying to sit: Max assist;HOB elevated       General bed mobility comments: significant cues for sequencing, max A to elevate trunk as patient limited effort following cues to push through elbow to assist    Transfers Overall transfer level: Needs assistance Equipment used: Rolling walker (2 wheeled) Transfers: Sit to/from [facility] Sit to Stand: Mod  assist;From elevated surface Stand pivot transfers: Mod assist       General transfer comment: please see toilet transfer in ADL section    Balance Overall balance assessment: Needs assistance;History of Falls Sitting-balance support: Feet supported Sitting balance-Leahy Scale: Fair     Standing balance support: Bilateral upper extremity supported Standing balance-Leahy Scale: Poor Standing balance comment: reliant on external support                           ADL either performed or assessed with clinical judgement   ADL Overall ADL's : Needs assistance/impaired Eating/Feeding: Set up;Sitting Eating/Feeding Details (indicate cue type and reason): to open containers, patient able to drink juice Grooming: Set up;Supervision/safety;Sitting   Upper Body Bathing: Minimal assistance;Sitting   Lower Body Bathing: Maximal assistance;Sit to/from stand Lower Body Bathing Details (indicate cue type and reason): poor standing balance + safety Upper Body Dressing : Minimal assistance;Bed level Upper Body Dressing Details (indicate cue type and reason): change into clean gown Lower Body Dressing: Maximal assistance;Sitting/lateral leans;Sit to/from stand Lower Body Dressing Details (indicate cue type and reason): seated patient is able to pull up his socks, however in standing heavily reliant on UE support, poor balance. per chart patient has assist with LB ADLs from family Toilet Transfer: Moderate assistance;Stand-pivot;Cueing for sequencing;Cueing for safety;RW Toilet Transfer Details (indicate cue type and reason): bed height elevated, needing 3 attempts and heavy mod A to power up to standing from edge of bed. increased time + tactile cues to back legs up to chair. assist to manage walker as patient tries to leave too far forward. cues to reach back when  sitting into recliner Toileting- Clothing Manipulation and Hygiene: Total assistance;Sit to/from stand       Functional  mobility during ADLs: Moderate assistance;Cueing for safety;Cueing for sequencing;Rolling walker                           Pertinent Vitals/Pain Pain Assessment: Faces Faces Pain Scale: Hurts little more Pain Location: B LEs with mobility Pain Descriptors / Indicators: Grimacing Pain Intervention(s): Monitored during session     Hand Dominance Right   Extremity/Trunk Assessment Upper Extremity Assessment Upper Extremity Assessment: Generalized weakness   Lower Extremity Assessment Lower Extremity Assessment: Defer to PT evaluation       Communication Communication Communication: No difficulties   Cognition Arousal/Alertness: Awake/alert Behavior During Therapy: Restless Overall Cognitive Status: No family/caregiver present to determine baseline cognitive functioning Area of Impairment: Orientation;Following commands;Safety/judgement;Problem solving                 Orientation Level: Disoriented to;Situation;Place;Time     Following Commands: Follows one step commands inconsistently;Follows one step commands with increased time Safety/Judgement: Decreased awareness of safety   Problem Solving: Slow processing;Requires verbal cues;Requires tactile cues General Comments: Patient replies cornwallis twice when asked where he is, does not know month only year. needs significant tactile cues for carry over of body mechanics during functional transfer. slow processing              Home Living Family/patient expects to be discharged to:: Private residence Living Arrangements: Children (daughter) Available Help at Discharge: Family;Available 24 hours/day;Personal care attendant (DTR works from home, caregiver 3x/week) Type of Home: House Home Access: Level entry     Home Layout: Multi-level Alternate Level Stairs-Number of Steps: flight   Bathroom Shower/Tub: Occupational psychologist: [place]: Grab bars - toilet;Grab bars -  tub/shower;Shower seat;Cane - single point;Walker - 4 wheels   Additional Comments: Drive into 1st floor (garage and bedroom), 2nd level has foyer, 3rd level (kitchen/pt's bed/bath); pt enters home from back deck with bil handrails from ground up to 2nd level      Prior Functioning/Environment Level of Independence: Needs assistance  Gait / Transfers Assistance Needed: pt reports household ambulator with rollator, falls "just about" everyday and stepson assists with getting up ADL's / Homemaking Assistance Needed: family assisting with LB dressing, bathes without assistance, family completes cooking, cleaning, driving   Comments: info taken from previous admission        OT Problem List: Decreased strength;Decreased activity tolerance;Impaired balance (sitting and/or standing);Decreased cognition;Decreased safety awareness;Obesity      OT Treatment/Interventions: Self-care/ADL training;Therapeutic exercise;Therapeutic activities;Cognitive remediation/compensation;Patient/family education;Balance training    OT Goals(Current goals can be found in the care plan section) Acute Rehab OT Goals Patient Stated Goal: eat breakfast OT Goal Formulation: With patient Time For Goal Achievement: 01/30/21 Potential to Achieve Goals: Good  OT Frequency: Min 2X/week    AM-PAC OT "6 Clicks" Daily Activity     Outcome Measure Help from another person eating meals?: A Little Help from another person taking care of personal grooming?: A Little Help from another person toileting, which includes using toliet, bedpan, or urinal?: A Lot Help from another person bathing (including washing, rinsing, drying)?: A Lot Help from another person to put on and taking off regular upper body clothing?: A Little Help from another person to put on and taking off regular lower body clothing?: A Lot 6 Click Score: 15   End  of Session Equipment Utilized During Treatment: Gait belt;Rolling walker Nurse Communication:  Mobility status  Activity Tolerance: Patient tolerated treatment well Patient left: in chair;with call bell/phone within reach;with chair alarm set  OT Visit Diagnosis: Unsteadiness on feet (R26.81);Muscle weakness (generalized) (M62.81);History of falling (Z91.81);Other symptoms and signs involving cognitive function                Time: [id number] OT Time Calculation (min): 28 min Charges:  OT General Charges $OT Visit: 1 Visit OT Evaluation $OT Eval Moderate Complexity: 1 Mod OT Treatments $Self Care/Home Management : 8-22 mins  Delbert Phenix OT OT pager: [phone] 01/16/2021, 9:47 AM

## 2021-01-16 NOTE — Progress Notes (Signed)
Physical Therapy Treatment Patient Details Name: [name] MRN: [id number] DOB: 02-Dec-1950 Today's Date: 01/16/2021    History of Present Illness 70 yo male admitted with encephalopathy, AKI, hypertensive urgency. Hx of CHF, frequent falls, chronic L sided weakness, diabetic neuropathy, prostate ca, MMI, CKD, renal transplant, CVA    PT Comments    Pt assisted with sitting EOB.  Pt requires significant assist for mobility at this time.  Utilized stedy for safety to assist pt with performing sit to stands. Pt fatigued very quickly and returned to supine.  Continue to recommend SNF upon d/c.    Follow Up Recommendations  SNF;Supervision/Assistance - 24 hour     Equipment Recommendations  None recommended by PT    Recommendations for Other Services       Precautions / Restrictions Precautions Precautions: Fall Precaution Comments: frequent falls per chart    Mobility  Bed Mobility Overal bed mobility: Needs Assistance Bed Mobility: Supine to Sit;Sit to Supine     Supine to sit: Max assist;+2 for physical assistance Sit to supine: Max assist;+2 for physical assistance   General bed mobility comments: multimodal cues required, pt initiated however unable to fully perform due to weakness    Transfers Overall transfer level: Needs assistance Equipment used:  Charlaine Dalton) Transfers: Sit to/from Stand Sit to Stand: Max assist;Mod assist;From elevated surface;+2 physical assistance         General transfer comment: multimodal cues for hand positioning and weight shifting, pt with difficulty initiating however once upright able to stand with only min assist in stedy (stedy utilized for safety); pt only able to perform twice due to weakness and fatigue  Ambulation/Gait                 Stairs             Wheelchair Mobility    Modified Rankin (Stroke Patients Only)       Balance Overall balance assessment: Needs assistance Sitting-balance support: Feet  supported;Bilateral upper extremity supported Sitting balance-Leahy Scale: Poor Sitting balance - Comments: briefly fair however required UE support   Standing balance support: Bilateral upper extremity supported Standing balance-Leahy Scale: Poor Standing balance comment: reliant on external support                            Cognition Arousal/Alertness: Awake/alert Behavior During Therapy: Flat affect Overall Cognitive Status: No family/caregiver present to determine baseline cognitive functioning Area of Impairment: Orientation;Following commands;Safety/judgement;Problem solving                 Orientation Level: Disoriented to;Situation;Place;Time     Following Commands: Follows one step commands inconsistently;Follows one step commands with increased time Safety/Judgement: Decreased awareness of safety   Problem Solving: Slow processing;Requires verbal cues;Requires tactile cues General Comments: pt unable to answer orientation questions, requires multimodal cues for technique      Exercises      General Comments        Pertinent Vitals/Pain Pain Assessment: Faces Faces Pain Scale: Hurts even more Pain Location: Lt LE (pt and son report pain is chronic) Pain Descriptors / Indicators: Grimacing;Tender Pain Intervention(s): Monitored during session;Repositioned    Home Living                      Prior Function            PT Goals (current goals can now be found in the care plan section) Progress  towards PT goals: Progressing toward goals    Frequency    Min 2X/week      PT Plan Current plan remains appropriate;Frequency needs to be updated    Co-evaluation              AM-PAC PT "6 Clicks" Mobility   Outcome Measure  Help needed turning from your back to your side while in a flat bed without using bedrails?: Total Help needed moving from lying on your back to sitting on the side of a flat bed without using bedrails?:  Total Help needed moving to and from a bed to a chair (including a wheelchair)?: Total Help needed standing up from a chair using your arms (e.g., wheelchair or bedside chair)?: Total Help needed to walk in hospital room?: Total Help needed climbing 3-5 steps with a railing? : Total 6 Click Score: 6    End of Session Equipment Utilized During Treatment: Gait belt Activity Tolerance: Patient tolerated treatment well;Patient limited by fatigue Patient left: in bed;with call bell/phone within reach;with bed alarm set   PT Visit Diagnosis: History of falling (Z91.81);Muscle weakness (generalized) (M62.81)     Time: [id number] PT Time Calculation (min) (ACUTE ONLY): 16 min  Charges:  $Therapeutic Activity: 8-22 mins                     Jannette Spanner PT, DPT Acute Rehabilitation Services Pager: [phone] Office: [phone]   York Ram E 01/16/2021, 3:16 PM

## 2021-01-17 ENCOUNTER — Inpatient Hospital Stay (HOSPITAL_COMMUNITY): Payer: Medicare Other

## 2021-01-17 LAB — COMPREHENSIVE METABOLIC PANEL
ALT: 12 U/L (ref 0–44)
AST: 19 U/L (ref 15–41)
Albumin: 3.1 g/dL — ABNORMAL LOW (ref 3.5–5.0)
Alkaline Phosphatase: 67 U/L (ref 38–126)
Anion gap: 9 (ref 5–15)
BUN: 28 mg/dL — ABNORMAL HIGH (ref 8–23)
CO2: 25 mmol/L (ref 22–32)
Calcium: 9.6 mg/dL (ref 8.9–10.3)
Chloride: 105 mmol/L (ref 98–111)
Creatinine, Ser: 1.82 mg/dL — ABNORMAL HIGH (ref 0.61–1.24)
GFR, Estimated: 39 mL/min — ABNORMAL LOW (ref >60)
Glucose, Bld: 162 mg/dL — ABNORMAL HIGH (ref 70–99)
Potassium: 4.3 mmol/L (ref 3.5–5.1)
Sodium: 139 mmol/L (ref 135–145)
Total Bilirubin: 1.1 mg/dL (ref 0.3–1.2)
Total Protein: 7 g/dL (ref 6.5–8.1)

## 2021-01-17 LAB — CBC
HCT: 48.1 % (ref 39.0–52.0)
Hemoglobin: 14.5 g/dL (ref 13.0–17.0)
MCH: 26.5 pg (ref 26.0–34.0)
MCHC: 30.1 g/dL (ref 30.0–36.0)
MCV: 87.8 fL (ref 80.0–100.0)
Platelets: 194 10*3/uL (ref 150–400)
RBC: 5.48 MIL/uL (ref 4.22–5.81)
RDW: 14.8 % (ref 11.5–15.5)
WBC: 2.4 10*3/uL — ABNORMAL LOW (ref 4.0–10.5)
nRBC: 0 % (ref 0.0–0.2)

## 2021-01-17 LAB — SARS CORONAVIRUS 2 (TAT 6-24 HRS): SARS Coronavirus 2: NEGATIVE

## 2021-01-17 LAB — URINALYSIS, ROUTINE W REFLEX MICROSCOPIC
Bilirubin Urine: NEGATIVE
Glucose, UA: 50 mg/dL — AB
Ketones, ur: 20 mg/dL — AB
Nitrite: NEGATIVE
Protein, ur: NEGATIVE mg/dL
RBC / HPF: >50 RBC/hpf — ABNORMAL HIGH (ref 0–5)
Specific Gravity, Urine: 1.01 (ref 1.005–1.030)
pH: 6 (ref 5.0–8.0)

## 2021-01-17 LAB — GLUCOSE, CAPILLARY
Glucose-Capillary: 163 mg/dL — ABNORMAL HIGH (ref 70–99)
Glucose-Capillary: 169 mg/dL — ABNORMAL HIGH (ref 70–99)
Glucose-Capillary: 187 mg/dL — ABNORMAL HIGH (ref 70–99)
Glucose-Capillary: 156 mg/dL — ABNORMAL HIGH (ref 70–99)

## 2021-01-17 LAB — TSH: TSH: 2.179 u[IU]/mL (ref 0.350–4.500)

## 2021-01-17 LAB — AMMONIA: Ammonia: 20 umol/L (ref 9–35)

## 2021-01-17 MED ORDER — PIPERACILLIN-TAZOBACTAM 3.375 G IVPB
3.3750 g | Freq: Three times a day (TID) | INTRAVENOUS | Status: DC
  Administered 2021-01-17 – 2021-01-22 (×15): 3.375 g via INTRAVENOUS
  Filled 2021-01-17 (×14): qty 50

## 2021-01-17 MED ORDER — LACTATED RINGERS IV SOLN: INTRAVENOUS | Status: DC
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
## 2021-01-17 NOTE — Progress Notes (Addendum)
Pharmacy Antibiotic Note  [name] is a 70 y.o. male admitted on 01/13/2021 with weakness and altered mental status, now  with fever of unknown source .  Pharmacy has been consulted for zosyn dosing.  UA with moderate leukocytes, negative nitrites, and many bacteria.  Blood cultures pending.  Ucx ordered. Tm 101.3 today and patient tachycardic.  WBC 2.4.    Plan: Zosyn 3.375g IV q8h (4 hour infusion). F/u culture data Monitor clinical improvement, ability to de-escalate antibiotics  Height: 5' 10.5" (179.1 cm) Weight: 94.3 kg (207 lb 14.3 oz) IBW/kg (Calculated) : 74.15  Temp (24hrs), Avg:99.2 F (37.3 C), Min:97.7 F (36.5 C), Max:101.3 F (38.5 C)  Recent Labs  Lab 01/13/21 1704 01/14/21 0815 01/15/21 0456 01/16/21 0427 01/17/21 0452  WBC 5.0 5.1 3.5* 2.7* 2.4*  CREATININE 3.70* 3.01* 2.34* 1.83* 1.82*    Estimated Creatinine Clearance: 42.7 mL/min (A) (by C-G formula based on SCr of 1.82 mg/dL (H)).    Allergies  Allergen Reactions   Codeine     Antimicrobials this admission: Zosyn 8/26 >>   Microbiology results: 8/26 BCx: sent 8/26 Ucx: ordered  Thank you for allowing pharmacy to be a part of this patient's care.  Dimple Nanas, PharmD 01/17/2021 1:13 PM

## 2021-01-17 NOTE — Plan of Care (Signed)
  Problem: Clinical Measurements: Goal: Ability to maintain clinical measurements within normal limits will improve Outcome: Progressing   Problem: Clinical Measurements: Goal: Diagnostic test results will improve Outcome: Progressing   Problem: Clinical Measurements: Goal: Cardiovascular complication will be avoided Outcome: Progressing   

## 2021-01-17 NOTE — Progress Notes (Signed)
Patient to MRI via bed.  No distress.

## 2021-01-18 LAB — CBC
HCT: 46.6 % (ref 39.0–52.0)
Hemoglobin: 14.3 g/dL (ref 13.0–17.0)
MCH: 27.2 pg (ref 26.0–34.0)
MCHC: 30.7 g/dL (ref 30.0–36.0)
MCV: 88.6 fL (ref 80.0–100.0)
Platelets: 185 10*3/uL (ref 150–400)
RBC: 5.26 MIL/uL (ref 4.22–5.81)
RDW: 15 % (ref 11.5–15.5)
WBC: 3.9 10*3/uL — ABNORMAL LOW (ref 4.0–10.5)
nRBC: 0 % (ref 0.0–0.2)

## 2021-01-18 LAB — GLUCOSE, CAPILLARY
Glucose-Capillary: 172 mg/dL — ABNORMAL HIGH (ref 70–99)
Glucose-Capillary: 228 mg/dL — ABNORMAL HIGH (ref 70–99)
Glucose-Capillary: 237 mg/dL — ABNORMAL HIGH (ref 70–99)

## 2021-01-18 LAB — BASIC METABOLIC PANEL
Anion gap: 11 (ref 5–15)
BUN: 34 mg/dL — ABNORMAL HIGH (ref 8–23)
CO2: 26 mmol/L (ref 22–32)
Calcium: 10 mg/dL (ref 8.9–10.3)
Chloride: 110 mmol/L (ref 98–111)
Creatinine, Ser: 2.21 mg/dL — ABNORMAL HIGH (ref 0.61–1.24)
GFR, Estimated: 31 mL/min — ABNORMAL LOW (ref >60)
Glucose, Bld: 190 mg/dL — ABNORMAL HIGH (ref 70–99)
Potassium: 4.2 mmol/L (ref 3.5–5.1)
Sodium: 147 mmol/L — ABNORMAL HIGH (ref 135–145)

## 2021-01-18 MED ORDER — SODIUM CHLORIDE 0.45 % IV SOLN: INTRAVENOUS | Status: DC
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
## 2021-01-18 NOTE — Plan of Care (Signed)
  Problem: Activity: Goal: Risk for activity intolerance will decrease Outcome: Progressing   Problem: Nutrition: Goal: Adequate nutrition will be maintained Outcome: Progressing   Problem: Safety: Goal: Ability to remain free from injury will improve Outcome: Progressing   Problem: Skin Integrity: Goal: Risk for impaired skin integrity will decrease Outcome: Progressing   
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
## 2021-01-19 LAB — GLUCOSE, CAPILLARY
Glucose-Capillary: 179 mg/dL — ABNORMAL HIGH (ref 70–99)
Glucose-Capillary: 222 mg/dL — ABNORMAL HIGH (ref 70–99)
Glucose-Capillary: 217 mg/dL — ABNORMAL HIGH (ref 70–99)

## 2021-01-19 LAB — BASIC METABOLIC PANEL
Anion gap: 11 (ref 5–15)
BUN: 29 mg/dL — ABNORMAL HIGH (ref 8–23)
CO2: 27 mmol/L (ref 22–32)
Calcium: 10.1 mg/dL (ref 8.9–10.3)
Chloride: 109 mmol/L (ref 98–111)
Creatinine, Ser: 1.83 mg/dL — ABNORMAL HIGH (ref 0.61–1.24)
GFR, Estimated: 39 mL/min — ABNORMAL LOW (ref >60)
Glucose, Bld: 199 mg/dL — ABNORMAL HIGH (ref 70–99)
Potassium: 4.1 mmol/L (ref 3.5–5.1)
Sodium: 147 mmol/L — ABNORMAL HIGH (ref 135–145)

## 2021-01-19 MED ORDER — INSULIN GLARGINE-YFGN 100 UNIT/ML ~~LOC~~ SOLN
5.0000 [IU] | Freq: Every day | SUBCUTANEOUS | Status: DC
  Administered 2021-01-19 – 2021-01-22 (×4): 5 [IU] via SUBCUTANEOUS
  Filled 2021-01-19 (×5): qty 0.05
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
## 2021-01-19 NOTE — Progress Notes (Signed)
Pt oob for most of day. Tolerated well. He was more alert and responsive today. So came by for a visit. He asked about [facility] POA paperwork. It was found at the beside unsigned.[name]

## 2021-01-19 NOTE — Plan of Care (Signed)

## 2021-01-20 LAB — BASIC METABOLIC PANEL
Anion gap: 9 (ref 5–15)
BUN: 23 mg/dL (ref 8–23)
CO2: 27 mmol/L (ref 22–32)
Calcium: 10.2 mg/dL (ref 8.9–10.3)
Chloride: 107 mmol/L (ref 98–111)
Creatinine, Ser: 1.73 mg/dL — ABNORMAL HIGH (ref 0.61–1.24)
GFR, Estimated: 41 mL/min — ABNORMAL LOW (ref >60)
Glucose, Bld: 211 mg/dL — ABNORMAL HIGH (ref 70–99)
Potassium: 4.1 mmol/L (ref 3.5–5.1)
Sodium: 143 mmol/L (ref 135–145)

## 2021-01-20 LAB — CBC
HCT: 49.9 % (ref 39.0–52.0)
Hemoglobin: 15 g/dL (ref 13.0–17.0)
MCH: 26.4 pg (ref 26.0–34.0)
MCHC: 30.1 g/dL (ref 30.0–36.0)
MCV: 87.9 fL (ref 80.0–100.0)
Platelets: 219 10*3/uL (ref 150–400)
RBC: 5.68 MIL/uL (ref 4.22–5.81)
RDW: 14.6 % (ref 11.5–15.5)
WBC: 2.4 10*3/uL — ABNORMAL LOW (ref 4.0–10.5)
nRBC: 0 % (ref 0.0–0.2)

## 2021-01-20 LAB — GLUCOSE, CAPILLARY
Glucose-Capillary: 191 mg/dL — ABNORMAL HIGH (ref 70–99)
Glucose-Capillary: 154 mg/dL — ABNORMAL HIGH (ref 70–99)
Glucose-Capillary: 218 mg/dL — ABNORMAL HIGH (ref 70–99)

## 2021-01-20 LAB — URINE CULTURE: Culture: >=100000 — AB
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
## 2021-01-21 LAB — GLUCOSE, CAPILLARY
Glucose-Capillary: 235 mg/dL — ABNORMAL HIGH (ref 70–99)
Glucose-Capillary: 188 mg/dL — ABNORMAL HIGH (ref 70–99)

## 2021-01-21 LAB — CBC
HCT: 52.3 % — ABNORMAL HIGH (ref 39.0–52.0)
Hemoglobin: 15.8 g/dL (ref 13.0–17.0)
MCH: 26.6 pg (ref 26.0–34.0)
MCHC: 30.2 g/dL (ref 30.0–36.0)
MCV: 87.9 fL (ref 80.0–100.0)
Platelets: 209 10*3/uL (ref 150–400)
RBC: 5.95 MIL/uL — ABNORMAL HIGH (ref 4.22–5.81)
RDW: 14.5 % (ref 11.5–15.5)
WBC: 2.6 10*3/uL — ABNORMAL LOW (ref 4.0–10.5)
nRBC: 0 % (ref 0.0–0.2)

## 2021-01-21 LAB — BASIC METABOLIC PANEL
Anion gap: 10 (ref 5–15)
BUN: 19 mg/dL (ref 8–23)
CO2: 24 mmol/L (ref 22–32)
Calcium: 10.3 mg/dL (ref 8.9–10.3)
Chloride: 107 mmol/L (ref 98–111)
Creatinine, Ser: 1.4 mg/dL — ABNORMAL HIGH (ref 0.61–1.24)
GFR, Estimated: 53 mL/min — ABNORMAL LOW (ref >60)
Glucose, Bld: 214 mg/dL — ABNORMAL HIGH (ref 70–99)
Potassium: 4.3 mmol/L (ref 3.5–5.1)
Sodium: 141 mmol/L (ref 135–145)

## 2021-01-21 MED ORDER — BISACODYL 10 MG RE SUPP: 10.0000 mg | Freq: Every day | RECTAL | Status: DC | PRN

## 2021-01-21 MED ORDER — ORAL CARE MOUTH RINSE
15.0000 mL | Freq: Two times a day (BID) | OROMUCOSAL | Status: DC
  Administered 2021-01-22 – 2021-01-29 (×10): 15 mL via OROMUCOSAL

## 2021-01-21 MED ORDER — CHLORHEXIDINE GLUCONATE 0.12 % MT SOLN
15.0000 mL | Freq: Two times a day (BID) | OROMUCOSAL | Status: DC
  Administered 2021-01-21 – 2021-01-29 (×15): 15 mL via OROMUCOSAL
  Filled 2021-01-21 (×16): qty 15
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
## 2021-01-22 DIAGNOSIS — N390 Urinary tract infection, site not specified: Secondary | ICD-10-CM

## 2021-01-22 DIAGNOSIS — F419 Anxiety disorder, unspecified: Secondary | ICD-10-CM

## 2021-01-22 DIAGNOSIS — I5032 Chronic diastolic (congestive) heart failure: Secondary | ICD-10-CM

## 2021-01-22 DIAGNOSIS — E041 Nontoxic single thyroid nodule: Secondary | ICD-10-CM

## 2021-01-22 DIAGNOSIS — R5381 Other malaise: Secondary | ICD-10-CM

## 2021-01-22 LAB — BASIC METABOLIC PANEL
Anion gap: 8 (ref 5–15)
BUN: 18 mg/dL (ref 8–23)
CO2: 25 mmol/L (ref 22–32)
Calcium: 10.1 mg/dL (ref 8.9–10.3)
Chloride: 107 mmol/L (ref 98–111)
Creatinine, Ser: 1.65 mg/dL — ABNORMAL HIGH (ref 0.61–1.24)
GFR, Estimated: 44 mL/min — ABNORMAL LOW (ref >60)
Glucose, Bld: 183 mg/dL — ABNORMAL HIGH (ref 70–99)
Potassium: 4.3 mmol/L (ref 3.5–5.1)
Sodium: 140 mmol/L (ref 135–145)

## 2021-01-22 LAB — TACROLIMUS LEVEL: Tacrolimus (FK506) - LabCorp: 7.3 ng/mL (ref 2.0–20.0)

## 2021-01-22 LAB — GLUCOSE, CAPILLARY
Glucose-Capillary: 267 mg/dL — ABNORMAL HIGH (ref 70–99)
Glucose-Capillary: 278 mg/dL — ABNORMAL HIGH (ref 70–99)
Glucose-Capillary: 191 mg/dL — ABNORMAL HIGH (ref 70–99)
Glucose-Capillary: 208 mg/dL — ABNORMAL HIGH (ref 70–99)
Glucose-Capillary: 240 mg/dL — ABNORMAL HIGH (ref 70–99)
Glucose-Capillary: 163 mg/dL — ABNORMAL HIGH (ref 70–99)
Glucose-Capillary: 149 mg/dL — ABNORMAL HIGH (ref 70–99)
Glucose-Capillary: 356 mg/dL — ABNORMAL HIGH (ref 70–99)
Glucose-Capillary: 234 mg/dL — ABNORMAL HIGH (ref 70–99)
Glucose-Capillary: 232 mg/dL — ABNORMAL HIGH (ref 70–99)

## 2021-01-22 LAB — CBC
HCT: 49.1 % (ref 39.0–52.0)
Hemoglobin: 15.3 g/dL (ref 13.0–17.0)
MCH: 26.7 pg (ref 26.0–34.0)
MCHC: 31.2 g/dL (ref 30.0–36.0)
MCV: 85.8 fL (ref 80.0–100.0)
Platelets: 225 10*3/uL (ref 150–400)
RBC: 5.72 MIL/uL (ref 4.22–5.81)
RDW: 14.7 % (ref 11.5–15.5)
WBC: 2.3 10*3/uL — ABNORMAL LOW (ref 4.0–10.5)
nRBC: 0 % (ref 0.0–0.2)

## 2021-01-22 LAB — CULTURE, BLOOD (ROUTINE X 2)
Culture: NO GROWTH
Culture: NO GROWTH
Special Requests: ADEQUATE
Special Requests: ADEQUATE

## 2021-01-22 LAB — FOLATE: Folate: 11.3 ng/mL (ref >5.9)

## 2021-01-22 NOTE — Assessment & Plan Note (Signed)
-   Evaluated by PT/OT.  Plan is for discharge to SNF

## 2021-01-22 NOTE — Assessment & Plan Note (Signed)
Resolved with treatment 

## 2021-01-22 NOTE — Assessment & Plan Note (Signed)
-   Continue aspirin, Plavix, statin

## 2021-01-22 NOTE — Progress Notes (Signed)
Progress Note    [name]   NOI:[id number]  DOB: 03-21-1951  DOA: 01/13/2021     8  PCP: [name], MD  Initial CC: Altered mental status  Hospital Course: [name] is a 70 yo male with PMH CKD III s/p renal transplant on tacrolimus and mycophenolate, dCHF, CAD, HTN, HLD, OSA, CVA with left-sided weakness, diabetes with nephropathy, prostate cancer who was brought to the ED due to altered mental status.   Patient lives with son at home, EMS was called and he was hypotensive with blood pressure in the 80s, IV fluids were started and he was brought to the ED. In the ED he was noted to be alert awake and oriented blood work showed AKI on CKD, UA negative for nitrates and leukocyte esterase, UDS is positive for benzodiazepine; ammonia 12, CT head no acute finding.   APS became involved due to patient's living conditions at home and clinical status on arrival. His mentation worsened after admission and he underwent further work-up for his encephalopathy.  MRI brain was performed on 01/17/2021 and negative for acute stroke but did show mild chronic small vessel ischemic disease and cerebral atrophy.  Also chronic left cerebellar infarcts. He then developed new fever during hospitalization and repeat UA was obtained.  This showed moderate leukocyte esterase and 21-50 WBCs with many bacteria.  He was started on Zosyn.  Urine cultures grew E. coli and Enterobacter cloacae.  He completed course of Zosyn during hospitalization.  His mentation improved some but did not return back to his normal baseline.  Interval History:  Patient's son was present this morning, [name].  Reviewed case with him as this was my first time meeting the patient.  Overall it appears that the patient came in with better mentation than he currently has.  He got a little worse roughly the middle of last week and has improved some but still not returned back to the typical baseline they are used to him being  at. Significant work-up has been performed and multiple etiologies had been treated and/or ruled out.  Discussed with [name] that this current level of mentation may possibly be due to baseline given his clinical picture.  ROS: Constitutional: negative for chills and fevers, Respiratory: negative for cough and sputum, Cardiovascular: negative for chest pain, and Gastrointestinal: negative  Assessment & Plan: * Acute metabolic encephalopathy - As of 01/22/2021 he is oriented to his name, year, city and son's name who visited, [name]. -Etiology is likely mixed in setting of nutrient deficiencies, resolved infection, dehydration on admission with ketosis, and underlying cerebral atrophy - other notable workup: B12 414, folate 11.3 - will send off B1 level as well - this current mentation may be patient's new baseline; have communicated this to family  Acute renal failure superimposed on stage 3b chronic kidney disease ([place]) - patient has history of CKD3b. Baseline creat ~ 1.6 - 1.7, eGFR 40 - patient presents with increase in creat >0.3 mg/dL above baseline, creat increase >1.5x baseline presumed to have occurred within past 7 days PTA -Presumed prerenal from dehydration on admission - Creatinine peaked at 3.7 and has returned back to baseline   Acute lower UTI-resolved as of 01/22/2021 - Urine cultures grew E. coli and Enterobacter cloacae - Completed Zosyn course during hospitalization  Thyroid nodule - Last thyroid ultrasound 2017 noted with bilateral nodules with recommendation for 1 year follow-up.  Do not see a repeat ultrasound since then - TSH = 2.179 on 01/17/21 -  will have patient follow up outpatient for repeat u/s  Physical deconditioning - Evaluated by PT/OT.  Plan is for discharge to SNF  Anxiety - Continue Valium as needed -Ideally would benefit from tapering or transitioning to shorter acting benzo  Chronic diastolic CHF (congestive heart failure) (HCC) - No signs or  symptoms of exacerbation -Last echo July 2021: EF greater than 78%, grade 1 diastolic dysfunction  DM (diabetes mellitus) type II controlled with renal manifestation (HCC) - Continue SSI and CBG monitoring -A1c 8.2% on 01/14/2021  Renal transplant recipient - Evaluated by nephrology - Tacrolimus level appropriate - Outpatient follow-up with transplant team  GERD (gastroesophageal reflux disease) - Continue Protonix  History of stroke - continue asa and plavix - MRI brain 8/26: negative for acute stroke.  Mild chronic small vessel ischemic disease and cerebral atrophy.  Chronic left cerebellar infarcts.  CAD -s/p DES to marginal vessel at [facility] 2014 - Continue aspirin, Plavix, statin  Gout - no signs of flare  Hypertensive urgency-resolved as of 01/22/2021 - Resolved with treatment    Old records reviewed in assessment of this patient  Antimicrobials: Zosyn 01/17/2021 >> 01/22/2021  DVT prophylaxis: heparin injection 5,000 Units Start: 01/15/21 1400 SCDs Start: 01/14/21 0740   Code Status:   Code Status: Full Code Family Communication: son, [name]   Disposition Plan: Status is: Inpatient  Remains inpatient appropriate because:Unsafe d/c plan and Inpatient level of care appropriate due to severity of illness  Dispo: The patient is from: Home              Anticipated d/c is to: SNF              Patient currently is not medically stable to d/c.   Difficult to place patient No      Risk of unplanned readmission score: Unplanned Admission- Pilot do not use: 19.92   Objective: Blood pressure 136/88, pulse 92, temperature 98.1 F (36.7 C), temperature source Oral, resp. rate 16, height 5' 10.5" (1.791 m), weight 90 kg, SpO2 99 %.  Examination: General appearance: alert, cooperative, and no distress Head: Normocephalic, without obvious abnormality, atraumatic Eyes:  EOMI Lungs: clear to auscultation bilaterally Heart: regular rate and rhythm and S1, S2  normal Abdomen: normal findings: bowel sounds normal and soft, non-tender Extremities:  no edema Skin: mobility and turgor normal Neurologic: oriented to name, year, city; follows commands; 3/5 B/L LE strength  Consultants:    Procedures:    Data Reviewed: I have personally reviewed following labs and imaging studies Results for orders placed or performed during the hospital encounter of 01/13/21 (from the past 24 hour(s))  Glucose, capillary     Status: Abnormal   Collection Time: 01/21/21  4:10 PM  Result Value Ref Range   Glucose-Capillary 235 (H) 70 - 99 mg/dL  Glucose, capillary     Status: Abnormal   Collection Time: 01/21/21  8:21 PM  Result Value Ref Range   Glucose-Capillary 188 (H) 70 - 99 mg/dL  Basic metabolic panel     Status: Abnormal   Collection Time: 01/22/21  5:09 AM  Result Value Ref Range   Sodium 140 135 - 145 mmol/L   Potassium 4.3 3.5 - 5.1 mmol/L   Chloride 107 98 - 111 mmol/L   CO2 25 22 - 32 mmol/L   Glucose, Bld 183 (H) 70 - 99 mg/dL   BUN 18 8 - 23 mg/dL   Creatinine, Ser 1.65 (H) 0.61 - 1.24 mg/dL   Calcium 10.1 8.9 -  10.3 mg/dL   GFR, Estimated 44 (L) >60 mL/min   Anion gap 8 5 - 15  CBC     Status: Abnormal   Collection Time: 01/22/21  5:09 AM  Result Value Ref Range   WBC 2.3 (L) 4.0 - 10.5 K/uL   RBC 5.72 4.22 - 5.81 MIL/uL   Hemoglobin 15.3 13.0 - 17.0 g/dL   HCT 49.1 39.0 - 52.0 %   MCV 85.8 80.0 - 100.0 fL   MCH 26.7 26.0 - 34.0 pg   MCHC 31.2 30.0 - 36.0 g/dL   RDW 14.7 11.5 - 15.5 %   Platelets 225 150 - 400 K/uL   nRBC 0.0 0.0 - 0.2 %  Glucose, capillary     Status: Abnormal   Collection Time: 01/22/21  7:21 AM  Result Value Ref Range   Glucose-Capillary 163 (H) 70 - 99 mg/dL  Glucose, capillary     Status: Abnormal   Collection Time: 01/22/21 11:22 AM  Result Value Ref Range   Glucose-Capillary 149 (H) 70 - 99 mg/dL  Folate     Status: None   Collection Time: 01/22/21 11:53 AM  Result Value Ref Range   Folate 11.3 >5.9  ng/mL    Recent Results (from the past 240 hour(s))  Resp Panel by RT-PCR (Flu A&B, Covid) Nasopharyngeal Swab     Status: None   Collection Time: 01/13/21  8:06 PM   Specimen: Nasopharyngeal Swab; Nasopharyngeal(NP) swabs in vial transport medium  Result Value Ref Range Status   SARS Coronavirus 2 by RT PCR NEGATIVE NEGATIVE Final    Comment: (NOTE) SARS-CoV-2 target nucleic acids are NOT DETECTED.  The SARS-CoV-2 RNA is generally detectable in upper respiratory specimens during the acute phase of infection. The lowest concentration of SARS-CoV-2 viral copies this assay can detect is 138 copies/mL. A negative result does not preclude SARS-Cov-2 infection and should not be used as the sole basis for treatment or other patient management decisions. A negative result may occur with  improper specimen collection/handling, submission of specimen other than nasopharyngeal swab, presence of viral mutation(s) within the areas targeted by this assay, and inadequate number of viral copies(<138 copies/mL). A negative result must be combined with clinical observations, patient history, and epidemiological information. The expected result is Negative.  Fact Sheet for Patients:  EntrepreneurPulse.com.au  Fact Sheet for Healthcare Providers:  IncredibleEmployment.be  This test is no t yet approved or cleared by the [place] FDA and  has been authorized for detection and/or diagnosis of SARS-CoV-2 by FDA under an Emergency Use Authorization (EUA). This EUA will remain  in effect (meaning this test can be used) for the duration of the COVID-19 declaration under Section 564(b)(1) of the Act, 21 U.S.C.section 360bbb-3(b)(1), unless the authorization is terminated  or revoked sooner.       Influenza A by PCR NEGATIVE NEGATIVE Final   Influenza B by PCR NEGATIVE NEGATIVE Final    Comment: (NOTE) The Xpert Xpress SARS-CoV-2/FLU/RSV plus assay is intended  as an aid in the diagnosis of influenza from Nasopharyngeal swab specimens and should not be used as a sole basis for treatment. Nasal washings and aspirates are unacceptable for Xpert Xpress SARS-CoV-2/FLU/RSV testing.  Fact Sheet for Patients: EntrepreneurPulse.com.au  Fact Sheet for Healthcare Providers: IncredibleEmployment.be  This test is not yet approved or cleared by the [place] FDA and has been authorized for detection and/or diagnosis of SARS-CoV-2 by FDA under an Emergency Use Authorization (EUA). This EUA will remain in effect (meaning  this test can be used) for the duration of the COVID-19 declaration under Section 564(b)(1) of the Act, 21 U.S.C. section 360bbb-3(b)(1), unless the authorization is terminated or revoked.  Performed at [facility], [place] [address], [place], [place] [address]   SARS CORONAVIRUS 2 (TAT 6-24 HRS) Nasopharyngeal Nasopharyngeal Swab     Status: None   Collection Time: 01/16/21  4:23 PM   Specimen: Nasopharyngeal Swab  Result Value Ref Range Status   SARS Coronavirus 2 NEGATIVE NEGATIVE Final    Comment: (NOTE) SARS-CoV-2 target nucleic acids are NOT DETECTED.  The SARS-CoV-2 RNA is generally detectable in upper and lower respiratory specimens during the acute phase of infection. Negative results do not preclude SARS-CoV-2 infection, do not rule out co-infections with other pathogens, and should not be used as the sole basis for treatment or other patient management decisions. Negative results must be combined with clinical observations, patient history, and epidemiological information. The expected result is Negative.  Fact Sheet for Patients: SugarRoll.be  Fact Sheet for Healthcare Providers: https://www.woods-mathews.com/  This test is not yet approved or cleared by the [place] FDA and  has been authorized for detection  and/or diagnosis of SARS-CoV-2 by FDA under an Emergency Use Authorization (EUA). This EUA will remain  in effect (meaning this test can be used) for the duration of the COVID-19 declaration under Se ction 564(b)(1) of the Act, 21 U.S.C. section 360bbb-3(b)(1), unless the authorization is terminated or revoked sooner.  Performed at [facility], [place] [address] [place], [place] [address]   Culture, blood (routine x 2)     Status: None   Collection Time: 01/17/21 12:46 PM   Specimen: Right Antecubital; Blood  Result Value Ref Range Status   Specimen Description   Final    RIGHT ANTECUBITAL Performed at [place] [address], [place], [place] [address]    Special Requests   Final    BOTTLES DRAWN AEROBIC ONLY Blood Culture adequate volume Performed at [place] [address] [place], [place] [address]    Culture   Final    NO GROWTH 5 DAYS Performed at [facility], [place] [address] [place], [place] [address]    Report Status 01/22/2021 FINAL  Final  Culture, blood (routine x 2)     Status: None   Collection Time: 01/17/21 12:46 PM   Specimen: BLOOD RIGHT HAND  Result Value Ref Range Status   Specimen Description   Final    BLOOD RIGHT HAND Performed at [place] [facility] [place] [address], [place], [place] [address]    Special Requests   Final    BOTTLES DRAWN AEROBIC ONLY Blood Culture adequate volume Performed at [place] [address], [place], [place] [address]    Culture   Final    NO GROWTH 5 DAYS Performed at [facility], [place] [address] [place], [place] [address]    Report Status 01/22/2021 FINAL  Final  Urine Culture     Status: Abnormal   Collection Time: 01/17/21  1:28 PM   Specimen: Urine, Clean Catch  Result Value Ref Range Status   Specimen Description   Final    URINE, CLEAN CATCH Performed at [facility], [place] [address],  [place],  [address]    Special Requests   Final    NONE Performed at [facility], [place] [address], [place],  [address]  Culture (A)  Final    >=100,000 COLONIES/mL ESCHERICHIA COLI >=100,000 COLONIES/mL ENTEROBACTER CLOACAE    Report Status 01/20/2021 FINAL  Final   Organism ID, Bacteria ESCHERICHIA COLI (A)  Final   Organism ID, Bacteria ENTEROBACTER CLOACAE (A)  Final      Susceptibility   Enterobacter cloacae - MIC*    CEFAZOLIN >=64 RESISTANT Resistant     CEFEPIME <=0.12 SENSITIVE Sensitive     CIPROFLOXACIN <=0.25 SENSITIVE Sensitive     GENTAMICIN <=1 SENSITIVE Sensitive     IMIPENEM <=0.25 SENSITIVE Sensitive     NITROFURANTOIN 64 INTERMEDIATE Intermediate     TRIMETH/SULFA <=20 SENSITIVE Sensitive     PIP/TAZO <=4 SENSITIVE Sensitive     * >=100,000 COLONIES/mL ENTEROBACTER CLOACAE   Escherichia coli - MIC*    AMPICILLIN <=2 SENSITIVE Sensitive     CEFAZOLIN <=4 SENSITIVE Sensitive     CEFEPIME <=0.12 SENSITIVE Sensitive     CEFTRIAXONE <=0.25 SENSITIVE Sensitive     CIPROFLOXACIN <=0.25 SENSITIVE Sensitive     GENTAMICIN <=1 SENSITIVE Sensitive     IMIPENEM <=0.25 SENSITIVE Sensitive     NITROFURANTOIN <=16 SENSITIVE Sensitive     TRIMETH/SULFA <=20 SENSITIVE Sensitive     AMPICILLIN/SULBACTAM <=2 SENSITIVE Sensitive     PIP/TAZO <=4 SENSITIVE Sensitive     * >=100,000 COLONIES/mL ESCHERICHIA COLI     Radiology Studies: No results found. DG Chest Port 1 View  Final Result    MR BRAIN WO CONTRAST  Final Result    CT HEAD WO CONTRAST  Final Result      Scheduled Meds:  amLODipine  10 mg Oral Daily   aspirin EC  81 mg Oral QHS   atorvastatin  40 mg Oral Daily   chlorhexidine  15 mL Mouth Rinse BID   clopidogrel  75 mg Oral Daily   heparin injection (subcutaneous)  5,000 Units Subcutaneous Q8H   insulin aspart  0-15 Units Subcutaneous TID WC   insulin glargine-yfgn  5 Units Subcutaneous QHS   mouth rinse  15 mL Mouth  Rinse q12n4p   mycophenolate  360 mg Oral Daily   And   mycophenolate  180 mg Oral Q2200   nortriptyline  100 mg Oral QHS   pantoprazole  40 mg Oral Daily   tacrolimus  5 mg Oral BID   PRN Meds: acetaminophen **OR** acetaminophen, bisacodyl, diazepam, metoprolol tartrate Continuous Infusions:   LOS: 8 days  Time spent: Greater than 50% of the 35 minute visit was spent in counseling/coordination of care for the patient as laid out in the A&P.   Dwyane Dee, MD [facility] 01/22/2021, 2:12 PM

## 2021-01-22 NOTE — Hospital Course (Addendum)
Mr. Hitt is a 70 yo male with PMH CKD III s/p renal transplant on tacrolimus and mycophenolate, dCHF, CAD, HTN, HLD, OSA, CVA with left-sided weakness, diabetes with nephropathy, prostate cancer who was brought to the ED due to altered mental status.   Patient lives with son at home, EMS was called and he was hypotensive with blood pressure in the 80s, IV fluids were started and he was brought to the ED. In the ED he was noted to be alert awake and oriented blood work showed AKI on CKD, UA negative for nitrates and leukocyte esterase, UDS is positive for benzodiazepine; ammonia 12, CT head no acute finding.   APS became involved due to patient's living conditions at home and clinical status on arrival. His mentation worsened after admission and he underwent further work-up for his encephalopathy.  MRI brain was performed on 01/17/2021 and negative for acute stroke but did show mild chronic small vessel ischemic disease and cerebral atrophy.  Also chronic left cerebellar infarcts. He then developed new fever during hospitalization and repeat UA was obtained.  This showed moderate leukocyte esterase and 21-50 WBCs with many bacteria.  He was started on Zosyn.  Urine cultures grew E. coli and Enterobacter cloacae.  He completed course of Zosyn during hospitalization.  His mentation improved some but did not return back to his normal baseline.

## 2021-01-22 NOTE — Assessment & Plan Note (Signed)
Continue Protonix °

## 2021-01-22 NOTE — Progress Notes (Signed)
Pharmacy Antibiotic Note  [name] is a 70 y.o. male admitted on 01/13/2021 with weakness and altered mental status, now  with fever of unknown source .  Pharmacy has been consulted for zosyn dosing.  UA with moderate leukocytes, negative nitrites, and many bacteria.     Plan: Zosyn 3.375g IV q8h (4 hour infusion). D6 IV abx, Afebrile, WBC low When able to transition to po abx, Bactrim DS bid would be good option  Height: 5' 10.5" (179.1 cm) Weight: 90 kg (198 lb 6.6 oz) IBW/kg (Calculated) : 74.15  Temp (24hrs), Avg:97.9 F (36.6 C), Min:97.5 F (36.4 C), Max:98.4 F (36.9 C)  Recent Labs  Lab 01/17/21 0452 01/18/21 0554 01/18/21 0647 01/19/21 0542 01/20/21 0450 01/21/21 0457 01/22/21 0509  WBC 2.4* 3.9*  --   --  2.4* 2.6* 2.3*  CREATININE 1.82*  --  2.21* 1.83* 1.73* 1.40* 1.65*     Estimated Creatinine Clearance: 46.1 mL/min (A) (by C-G formula based on SCr of 1.65 mg/dL (H)).    Allergies  Allergen Reactions   Codeine     Antimicrobials this admission: Zosyn 8/26 >>   Microbiology results: 8/26 BCx: ngtd 8/26 Ucx: E coli (pan-sens), Enterobacter cloacae (Ancef resistant)  Thank you for allowing pharmacy to be a part of this patient's care.  Minda Ditto PharmD [facility] [phone] 01/22/2021, 8:41 AM

## 2021-01-22 NOTE — Assessment & Plan Note (Signed)
-  patient has history of CKD3b. Baseline creat ~ 1.6 - 1.7, eGFR 40 - patient presents with increase in creat >0.3 mg/dL above baseline, creat increase >1.5x baseline presumed to have occurred within past 7 days PTA -Presumed prerenal from dehydration on admission - Creatinine peaked at 3.7 and has returned back to baseline

## 2021-01-22 NOTE — Assessment & Plan Note (Signed)
-   continue asa and plavix - MRI brain 8/26: negative for acute stroke.  Mild chronic small vessel ischemic disease and cerebral atrophy.  Chronic left cerebellar infarcts.

## 2021-01-22 NOTE — Assessment & Plan Note (Signed)
-   no signs of flare

## 2021-01-22 NOTE — Assessment & Plan Note (Addendum)
-   As of 01/22/2021 he is oriented to his name, year, city and son's name who visited, [name]. -Etiology is likely mixed in setting of nutrient deficiencies, resolved infection, dehydration on admission with ketosis, and underlying cerebral atrophy - other notable workup: B12 414, folate 11.3 - will send off B1 level as well (level is 104, mid range so not likely to be Wernicke's) - this current mentation may be patient's new baseline; have communicated this to family - some concern for possible catatonia on 9/1. He had +benzo on UDS on admission and is on consistent valium at home; no valium since admission and patient was noted to be talking normally on admission then had a mentation change; some concern for benzo withdrawal although half life for valium is quite long; no significant improvement with ativan trial and therefore discontinued - EEG checked on 9/2, also negative for seizure activity; shows moderate diffuse encephalopathy - NH3 normal on admission 8/22 and on 8/26; repeated 9/2 and mildly elevated 42 (treated with lactulose enema); will continue on BID lactulose for now and also monitor mentation response  -Given poor improvement with patient unable to ambulate, will consult palliative care to help initiate some [place] discussions with his son, [name] - changed to DNR on 9/5 after [facility] discussion with family; meeting with family on 9/6 as well for more talks. After this if he remains stable could d/c to SNF on Wed or Thurs.

## 2021-01-22 NOTE — TOC Progression Note (Addendum)
Transition of Care [facility]) - Progression Note    Patient Details  Name: TALLAN HIERS MRN: [id number] Date of Birth: [birth year]  Transition of [facility]) CM/SW Contact  Metztli Sachdev, Juliann Pulse, RN Phone Number: 01/22/2021, 11:35 AM  Clinical Narrative:  Not medically stable-will start auth again within 24-48hrs of d/c likely start auth Thursday since likely d/c Friday-[address] rep Shane aware.will need new covid ordered Thursday.  12?39p-Received call from [place] case worker Samule Ohm [phone] has reached out to son [name]-but unsuccessful with conversation since timing was not good for [name]-she will contact ex wife to update.    Expected Discharge Plan: [place] Barriers to Discharge: Continued Medical Work up  Expected Discharge Plan and Services Expected Discharge Plan: [place]   Discharge Planning Services: CM Consult   Living arrangements for the past 2 months: Single Family Home                                       Social Determinants of Health (SDOH) Interventions    Readmission Risk Interventions No flowsheet data found.

## 2021-01-22 NOTE — Assessment & Plan Note (Addendum)
-   d/c valium - ativan trial as above (did not respond)

## 2021-01-22 NOTE — Progress Notes (Signed)
Occupational Therapy Treatment Patient Details Name: [name] MRN: [id number] DOB: [birth year] Today's Date: 01/22/2021    History of present illness Patient is a 70 yo male admitted with encephalopathy, AKI, hypertensive urgency. Hx of CHF, frequent falls, chronic L sided weakness, diabetic neuropathy, prostate ca, MMI, CKD, renal transplant, CVA   OT comments  Patient was noted to be lethargic on this date. Patient was able to answer yes no questions during session with increased time. Patient was max A x 2 for sitting up on edge of bed with patient grimacing with BLE movement. Nursing made aware. Patient repositioned in bed was max A x2. Patient's discharge plan remains appropriate at this time. OT will continue to follow acutely.    Follow Up Recommendations  SNF    Equipment Recommendations  None recommended by OT    Recommendations for Other Services      Precautions / Restrictions Precautions Precautions: Fall Precaution Comments: frequent falls per chart Restrictions Weight Bearing Restrictions: No       Mobility Bed Mobility Overal bed mobility: Needs Assistance Bed Mobility: Supine to Sit   Sidelying to sit: +2 for safety/equipment;+2 for physical assistance;Max assist;HOB elevated Supine to sit: Max assist;+2 for physical assistance;HOB elevated;+2 for safety/equipment Sit to supine: Max assist;+2 for physical assistance   General bed mobility comments: patient was lethargic with increased cues to assist patient with attending to tasks.    Transfers                      Balance                                           ADL either performed or assessed with clinical judgement   ADL                                         General ADL Comments: patient was noted to have limited interations and intermittent ability to follow cues for participation in ADLs. patient participated in supine to sit on edge of bed  with max A x 2 with sitting balance varying from max A to min guard at times with posterior leaning noted. patient indicated pain in bilateral hips. nursing made aware.     Vision       Perception     Praxis      Cognition Arousal/Alertness: Lethargic Behavior During Therapy: Flat affect Overall Cognitive Status: No family/caregiver present to determine baseline cognitive functioning Area of Impairment: Orientation;Following commands;Safety/judgement;Problem solving                 Orientation Level: Disoriented to;Situation;Place;Time     Following Commands: Follows one step commands inconsistently Safety/Judgement: Decreased awareness of safety   Problem Solving: Slow processing;Requires verbal cues;Requires tactile cues General Comments: pt unable to answer orientation questions other than stating his name, cannot state birthdate nor location, requires multimodal cues for technique        Exercises     Shoulder Instructions       General Comments      Pertinent Vitals/ Pain       Faces Pain Scale: Hurts even more Pain Location: B knees with movement, and bilateral heels Pain Descriptors / Indicators: Grimacing;Tender Pain Intervention(s): Monitored during session;Repositioned  Home  Living                                          Prior Functioning/Environment              Frequency  Min 2X/week        Progress Toward Goals  OT Goals(current goals can now be found in the care plan section)     Acute Rehab OT Goals Patient Stated Goal: none stated  Plan Discharge plan remains appropriate    Co-evaluation                 AM-PAC OT "6 Clicks" Daily Activity     Outcome Measure   Help from another person eating meals?: A Little Help from another person taking care of personal grooming?: A Little Help from another person toileting, which includes using toliet, bedpan, or urinal?: A Lot Help from another person  bathing (including washing, rinsing, drying)?: A Lot Help from another person to put on and taking off regular upper body clothing?: A Little Help from another person to put on and taking off regular lower body clothing?: A Lot 6 Click Score: 15    End of Session    OT Visit Diagnosis: Unsteadiness on feet (R26.81);Muscle weakness (generalized) (M62.81);History of falling (Z91.81);Other symptoms and signs involving cognitive function   Activity Tolerance Patient limited by fatigue   Patient Left in bed;with call bell/phone within reach;with bed alarm set   Nurse Communication Mobility status;Other (comment) (pain levels with movement)        Time: 0940-1009 OT Time Calculation (min): 29 min  Charges: OT General Charges $OT Visit: 1 Visit OT Treatments $Therapeutic Activity: 23-37 mins  Jackelyn Poling OTR/L, MS Acute Rehabilitation Department Office# [phone] Pager# [phone]    [place] 01/22/2021, 1:31 PM

## 2021-01-22 NOTE — Assessment & Plan Note (Addendum)
-   Evaluated by nephrology - Tacrolimus level appropriate - Outpatient follow-up with transplant team

## 2021-01-22 NOTE — Assessment & Plan Note (Signed)
-   No signs or symptoms of exacerbation -Last echo July 2021: EF greater than [id number], grade 1 diastolic dysfunction

## 2021-01-22 NOTE — Assessment & Plan Note (Signed)
-   Urine cultures grew E. coli and Enterobacter cloacae - Completed Zosyn course during hospitalization

## 2021-01-22 NOTE — Assessment & Plan Note (Signed)
-   Continue SSI and CBG monitoring -A1c 8.2% on 01/14/2021

## 2021-01-22 NOTE — Assessment & Plan Note (Addendum)
-   Last thyroid ultrasound 2017 noted with bilateral nodules with recommendation for 1 year follow-up.  Do not see a repeat ultrasound since then - TSH = 2.179 on 01/17/21 - will have patient follow up outpatient for repeat u/s

## 2021-01-23 LAB — CBC WITH DIFFERENTIAL/PLATELET
Abs Immature Granulocytes: 0.02 10*3/uL (ref 0.00–0.07)
Basophils Absolute: 0 10*3/uL (ref 0.0–0.1)
Basophils Relative: 0 %
Eosinophils Absolute: 0.1 10*3/uL (ref 0.0–0.5)
Eosinophils Relative: 3 %
HCT: 50.9 % (ref 39.0–52.0)
Hemoglobin: 15.7 g/dL (ref 13.0–17.0)
Immature Granulocytes: 1 %
Lymphocytes Relative: 18 %
Lymphs Abs: 0.5 10*3/uL — ABNORMAL LOW (ref 0.7–4.0)
MCH: 26.3 pg (ref 26.0–34.0)
MCHC: 30.8 g/dL (ref 30.0–36.0)
MCV: 85.1 fL (ref 80.0–100.0)
Monocytes Absolute: 0.3 10*3/uL (ref 0.1–1.0)
Monocytes Relative: 11 %
Neutro Abs: 1.8 10*3/uL (ref 1.7–7.7)
Neutrophils Relative %: 67 %
Platelets: 235 10*3/uL (ref 150–400)
RBC: 5.98 MIL/uL — ABNORMAL HIGH (ref 4.22–5.81)
RDW: 14.6 % (ref 11.5–15.5)
WBC: 2.7 10*3/uL — ABNORMAL LOW (ref 4.0–10.5)
nRBC: 0 % (ref 0.0–0.2)

## 2021-01-23 LAB — GLUCOSE, CAPILLARY
Glucose-Capillary: 205 mg/dL — ABNORMAL HIGH (ref 70–99)
Glucose-Capillary: 195 mg/dL — ABNORMAL HIGH (ref 70–99)
Glucose-Capillary: 216 mg/dL — ABNORMAL HIGH (ref 70–99)
Glucose-Capillary: 182 mg/dL — ABNORMAL HIGH (ref 70–99)

## 2021-01-23 LAB — COMPREHENSIVE METABOLIC PANEL
ALT: 43 U/L (ref 0–44)
AST: 39 U/L (ref 15–41)
Albumin: 3.2 g/dL — ABNORMAL LOW (ref 3.5–5.0)
Alkaline Phosphatase: 64 U/L (ref 38–126)
Anion gap: 8 (ref 5–15)
BUN: 21 mg/dL (ref 8–23)
CO2: 25 mmol/L (ref 22–32)
Calcium: 10.4 mg/dL — ABNORMAL HIGH (ref 8.9–10.3)
Chloride: 111 mmol/L (ref 98–111)
Creatinine, Ser: 1.5 mg/dL — ABNORMAL HIGH (ref 0.61–1.24)
GFR, Estimated: 49 mL/min — ABNORMAL LOW (ref >60)
Glucose, Bld: 211 mg/dL — ABNORMAL HIGH (ref 70–99)
Potassium: 4.3 mmol/L (ref 3.5–5.1)
Sodium: 144 mmol/L (ref 135–145)
Total Bilirubin: 0.8 mg/dL (ref 0.3–1.2)
Total Protein: 7 g/dL (ref 6.5–8.1)

## 2021-01-23 LAB — MAGNESIUM: Magnesium: 1.5 mg/dL — ABNORMAL LOW (ref 1.7–2.4)

## 2021-01-23 MED ORDER — MAGNESIUM SULFATE 2 GM/50ML IV SOLN
2.0000 g | Freq: Once | INTRAVENOUS | Status: AC
  Administered 2021-01-23: 2 g via INTRAVENOUS
  Filled 2021-01-23: qty 50

## 2021-01-23 MED ORDER — LORAZEPAM 2 MG/ML IJ SOLN
1.0000 mg | Freq: Three times a day (TID) | INTRAMUSCULAR | Status: AC
  Administered 2021-01-23 (×3): 1 mg via INTRAVENOUS
  Filled 2021-01-23 (×3): qty 1

## 2021-01-23 MED ORDER — INSULIN GLARGINE-YFGN 100 UNIT/ML ~~LOC~~ SOLN
8.0000 [IU] | Freq: Every day | SUBCUTANEOUS | Status: DC
  Administered 2021-01-23 – 2021-01-28 (×5): 8 [IU] via SUBCUTANEOUS
  Filled 2021-01-23 (×7): qty 0.08

## 2021-01-23 MED ORDER — THIAMINE HCL 100 MG PO TABS
100.0000 mg | ORAL_TABLET | Freq: Every day | ORAL | Status: DC
  Administered 2021-01-23 – 2021-01-29 (×7): 100 mg via ORAL
  Filled 2021-01-23 (×7): qty 1

## 2021-01-23 NOTE — Progress Notes (Signed)
Inpatient Diabetes Program Recommendations  AACE/ADA: New Consensus Statement on Inpatient Glycemic Control (2015)  Target Ranges:  Prepandial:   less than 140 mg/dL      Peak postprandial:   less than 180 mg/dL (1-2 hours)      Critically ill patients:  140 - 180 mg/dL   Lab Results  Component Value Date   GLUCAP 195 (H) 01/23/2021   HGBA1C 8.2 (H) 01/14/2021    Review of Glycemic Control  Diabetes history: DM2 Outpatient Diabetes medications: 70/30 25 units BID, Ozempic 1 mg 2x/week Current orders for Inpatient glycemic control: Semglee 8 units QHS, Novolog 0-15 units TID with meals  HgbA1C - 8.2%  Inpatient Diabetes Program Recommendations:    Continue titration of Semglee until FBS < 180 mg/dL Add Novolog 0-5 HS.  Will continue to follow glucose trends.  Thank you. Lorenda Peck, RD, LDN, CDE Inpatient Diabetes Coordinator [phone]
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
## 2021-01-23 NOTE — TOC Progression Note (Addendum)
Transition of Care [facility]) - Progression Note    Patient Details  Name: [name] MRN: [id number] Date of Birth: 1950/06/17  [facility] CM/SW Contact  Malosi Hemstreet, Juliann Pulse, RN Phone Number: 01/23/2021, 1:38 PM  Clinical Narrative:  [facility] [phone] [place]. Noted [employer] medicare no longer on faxe sheet for billing-informed admit-rep Bonnie;contacted [name](son) to call Admit to add [place] for billing. Covid ordered. 3:33p-Received auth-Plan [name] [id number] atuh till 9/3-MD updated.[address] requesting Booster-patient's son [name] Press photographer.MD notified    Expected Discharge Plan: [place] Barriers to Discharge: Insurance Authorization  Expected Discharge Plan and Services Expected Discharge Plan: [place]   Discharge Planning Services: CM Consult   Living arrangements for the past 2 months: Single Family Home                                       Social Determinants of Health (SDOH) Interventions    Readmission Risk Interventions No flowsheet data found.

## 2021-01-23 NOTE — Plan of Care (Signed)
  Problem: Activity: Goal: Risk for activity intolerance will decrease Outcome: Not Progressing   Problem: Nutrition: Goal: Adequate nutrition will be maintained Outcome: Not Progressing   

## 2021-01-23 NOTE — Progress Notes (Signed)
Progress Note    [name]   IYM:[id number]  DOB: 03-04-1951  DOA: 01/13/2021     9  PCP: [name], MD  Initial CC: Altered mental status  Hospital Course: [name] is a 70 yo male with PMH CKD III s/p renal transplant on tacrolimus and mycophenolate, dCHF, CAD, HTN, HLD, OSA, CVA with left-sided weakness, diabetes with nephropathy, prostate cancer who was brought to the ED due to altered mental status.   Patient lives with son at home, EMS was called and he was hypotensive with blood pressure in the 80s, IV fluids were started and he was brought to the ED. In the ED he was noted to be alert awake and oriented blood work showed AKI on CKD, UA negative for nitrates and leukocyte esterase, UDS is positive for benzodiazepine; ammonia 12, CT head no acute finding.   APS became involved due to patient's living conditions at home and clinical status on arrival. His mentation worsened after admission and he underwent further work-up for his encephalopathy.  MRI brain was performed on 01/17/2021 and negative for acute stroke but did show mild chronic small vessel ischemic disease and cerebral atrophy.  Also chronic left cerebellar infarcts. He then developed new fever during hospitalization and repeat UA was obtained.  This showed moderate leukocyte esterase and 21-50 WBCs with many bacteria.  He was started on Zosyn.  Urine cultures grew E. coli and Enterobacter cloacae.  He completed course of Zosyn during hospitalization.  His mentation improved some but did not return back to his normal baseline.  Interval History:  No events overnight. He's more withdrawn today. He does nod his head but movements are very small and he's almost catatonic in nature. Starting him on an ativan challenge today in case he has been experiencing withdrawal.  Still hoping for d/c tomorrow pending response to today.   ROS: Constitutional: negative for chills and fevers, Respiratory: negative for cough and  sputum, Cardiovascular: negative for chest pain, and Gastrointestinal: negative  Assessment & Plan: * Acute metabolic encephalopathy - As of 01/22/2021 he is oriented to his name, year, city and son's name who visited, [name]. -Etiology is likely mixed in setting of nutrient deficiencies, resolved infection, dehydration on admission with ketosis, and underlying cerebral atrophy - other notable workup: B12 414, folate 11.3 - will send off B1 level as well - this current mentation may be patient's new baseline; have communicated this to family - some concern for possible catatonia on 9/1. He had +benzo on UDS on admission and is on consistent valium at home; no valium since admission and patient was noted to be talking normally on admission then had a mentation change; some concern for benzo withdrawal although half life for valium is quite long; will perform an ativan trial today and see if he improves  Acute renal failure superimposed on stage 3b chronic kidney disease ([place]) - patient has history of CKD3b. Baseline creat ~ 1.6 - 1.7, eGFR 40 - patient presents with increase in creat >0.3 mg/dL above baseline, creat increase >1.5x baseline presumed to have occurred within past 7 days PTA -Presumed prerenal from dehydration on admission - Creatinine peaked at 3.7 and has returned back to baseline   Acute lower UTI-resolved as of 01/22/2021 - Urine cultures grew E. coli and Enterobacter cloacae - Completed Zosyn course during hospitalization  Thyroid nodule - Last thyroid ultrasound 2017 noted with bilateral nodules with recommendation for 1 year follow-up.  Do not see a repeat  ultrasound since then - TSH = 2.179 on 01/17/21 - will have patient follow up outpatient for repeat u/s  Physical deconditioning - Evaluated by PT/OT.  Plan is for discharge to SNF  Anxiety - Continue Valium as needed -Ideally would benefit from tapering or transitioning to shorter acting benzo  Chronic diastolic CHF  (congestive heart failure) (HCC) - No signs or symptoms of exacerbation -Last echo July 2021: EF greater than 18%, grade 1 diastolic dysfunction  DM (diabetes mellitus) type II controlled with renal manifestation (HCC) - Continue SSI and CBG monitoring -A1c 8.2% on 01/14/2021  Renal transplant recipient - Evaluated by nephrology - Tacrolimus level appropriate - Outpatient follow-up with transplant team  GERD (gastroesophageal reflux disease) - Continue Protonix  History of stroke - continue asa and plavix - MRI brain 8/26: negative for acute stroke.  Mild chronic small vessel ischemic disease and cerebral atrophy.  Chronic left cerebellar infarcts.  CAD -s/p DES [facility] marginal vessel [facility] 2014 - Continue aspirin, Plavix, statin  Gout - no signs of flare  Hypertensive urgency-resolved as of 01/22/2021 - Resolved with treatment   Old records reviewed in assessment of this patient  Antimicrobials: Zosyn 01/17/2021 >> 01/22/2021  DVT prophylaxis: heparin injection 5,000 Units Start: 01/15/21 1400 SCDs Start: 01/14/21 0740   Code Status:   Code Status: Full Code Family Communication: son, [name]   Disposition Plan: Status is: Inpatient  Remains inpatient appropriate because:Unsafe d/c plan and Inpatient level of care appropriate due to severity of illness  Dispo: The patient is from: Home              Anticipated d/c is to: SNF              Patient currently is not medically stable to d/c.   Difficult to place patient No Risk of unplanned readmission score: Unplanned Admission- Pilot do not use: 20.4   Objective: Blood pressure 138/84, pulse 88, temperature (!) 97.5 F (36.4 C), temperature source Oral, resp. rate 15, height 5' 10.5" (1.791 m), weight 89.3 kg, SpO2 96 %.  Examination: General appearance:  awake but much less interactive with very slow/delayed movements; only answered his name today Head: Normocephalic, without obvious abnormality, atraumatic Eyes:   EOMI Lungs: clear to auscultation bilaterally Heart: regular rate and rhythm and S1, S2 normal Abdomen: normal findings: bowel sounds normal and soft, non-tender Extremities:  no edema Skin: mobility and turgor normal Neurologic: oriented to name only today; does follow commands but very minimal body movements   Consultants:    Procedures:    Data Reviewed: I have personally reviewed following labs and imaging studies Results for orders placed or performed during the hospital encounter of 01/13/21 (from the past 24 hour(s))  Glucose, capillary     Status: Abnormal   Collection Time: 01/22/21  4:07 PM  Result Value Ref Range   Glucose-Capillary 356 (H) 70 - 99 mg/dL  Glucose, capillary     Status: Abnormal   Collection Time: 01/22/21  5:01 PM  Result Value Ref Range   Glucose-Capillary 234 (H) 70 - 99 mg/dL  Glucose, capillary     Status: Abnormal   Collection Time: 01/22/21  8:48 PM  Result Value Ref Range   Glucose-Capillary 232 (H) 70 - 99 mg/dL  CBC with Differential/Platelet     Status: Abnormal   Collection Time: 01/23/21  5:03 AM  Result Value Ref Range   WBC 2.7 (L) 4.0 - 10.5 K/uL   RBC 5.98 (H) 4.22 -  5.81 MIL/uL   Hemoglobin 15.7 13.0 - 17.0 g/dL   HCT 50.9 39.0 - 52.0 %   MCV 85.1 80.0 - 100.0 fL   MCH 26.3 26.0 - 34.0 pg   MCHC 30.8 30.0 - 36.0 g/dL   RDW 14.6 11.5 - 15.5 %   Platelets 235 150 - 400 K/uL   nRBC 0.0 0.0 - 0.2 %   Neutrophils Relative % 67 %   Neutro Abs 1.8 1.7 - 7.7 K/uL   Lymphocytes Relative 18 %   Lymphs Abs 0.5 (L) 0.7 - 4.0 K/uL   Monocytes Relative 11 %   Monocytes Absolute 0.3 0.1 - 1.0 K/uL   Eosinophils Relative 3 %   Eosinophils Absolute 0.1 0.0 - 0.5 K/uL   Basophils Relative 0 %   Basophils Absolute 0.0 0.0 - 0.1 K/uL   Immature Granulocytes 1 %   Abs Immature Granulocytes 0.02 0.00 - 0.07 K/uL  Comprehensive metabolic panel     Status: Abnormal   Collection Time: 01/23/21  5:03 AM  Result Value Ref Range   Sodium 144  135 - 145 mmol/L   Potassium 4.3 3.5 - 5.1 mmol/L   Chloride 111 98 - 111 mmol/L   CO2 25 22 - 32 mmol/L   Glucose, Bld 211 (H) 70 - 99 mg/dL   BUN 21 8 - 23 mg/dL   Creatinine, Ser 1.50 (H) 0.61 - 1.24 mg/dL   Calcium 10.4 (H) 8.9 - 10.3 mg/dL   Total Protein 7.0 6.5 - 8.1 g/dL   Albumin 3.2 (L) 3.5 - 5.0 g/dL   AST 39 15 - 41 U/L   ALT 43 0 - 44 U/L   Alkaline Phosphatase 64 38 - 126 U/L   Total Bilirubin 0.8 0.3 - 1.2 mg/dL   GFR, Estimated 49 (L) >60 mL/min   Anion gap 8 5 - 15  Magnesium     Status: Abnormal   Collection Time: 01/23/21  5:03 AM  Result Value Ref Range   Magnesium 1.5 (L) 1.7 - 2.4 mg/dL  Glucose, capillary     Status: Abnormal   Collection Time: 01/23/21  7:28 AM  Result Value Ref Range   Glucose-Capillary 205 (H) 70 - 99 mg/dL  Glucose, capillary     Status: Abnormal   Collection Time: 01/23/21 11:25 AM  Result Value Ref Range   Glucose-Capillary 195 (H) 70 - 99 mg/dL    Recent Results (from the past 240 hour(s))  Resp Panel by RT-PCR (Flu A&B, Covid) Nasopharyngeal Swab     Status: None   Collection Time: 01/13/21  8:06 PM   Specimen: Nasopharyngeal Swab; Nasopharyngeal(NP) swabs in vial transport medium  Result Value Ref Range Status   SARS Coronavirus 2 by RT PCR NEGATIVE NEGATIVE Final    Comment: (NOTE) SARS-CoV-2 target nucleic acids are NOT DETECTED.  The SARS-CoV-2 RNA is generally detectable in upper respiratory specimens during the acute phase of infection. The lowest concentration of SARS-CoV-2 viral copies this assay can detect is 138 copies/mL. A negative result does not preclude SARS-Cov-2 infection and should not be used as the sole basis for treatment or other patient management decisions. A negative result may occur with  improper specimen collection/handling, submission of specimen other than nasopharyngeal swab, presence of viral mutation(s) within the areas targeted by this assay, and inadequate number of viral copies(<138  copies/mL). A negative result must be combined with clinical observations, patient history, and epidemiological information. The expected result is Negative.  Fact Sheet for Patients:  EntrepreneurPulse.com.au  Fact Sheet for Healthcare Providers:  IncredibleEmployment.be  This test is no t yet approved or cleared by the [place] FDA and  has been authorized for detection and/or diagnosis of SARS-CoV-2 by FDA under an Emergency Use Authorization (EUA). This EUA will remain  in effect (meaning this test can be used) for the duration of the COVID-19 declaration under Section 564(b)(1) of the Act, 21 U.S.C.section 360bbb-3(b)(1), unless the authorization is terminated  or revoked sooner.       Influenza A by PCR NEGATIVE NEGATIVE Final   Influenza B by PCR NEGATIVE NEGATIVE Final    Comment: (NOTE) The Xpert Xpress SARS-CoV-2/FLU/RSV plus assay is intended as an aid in the diagnosis of influenza from Nasopharyngeal swab specimens and should not be used as a sole basis for treatment. Nasal washings and aspirates are unacceptable for Xpert Xpress SARS-CoV-2/FLU/RSV testing.  Fact Sheet for Patients: EntrepreneurPulse.com.au  Fact Sheet for Healthcare Providers: IncredibleEmployment.be  This test is not yet approved or cleared by the [place] FDA and has been authorized for detection and/or diagnosis of SARS-CoV-2 by FDA under an Emergency Use Authorization (EUA). This EUA will remain in effect (meaning this test can be used) for the duration of the COVID-19 declaration under Section 564(b)(1) of the Act, 21 U.S.C. section 360bbb-3(b)(1), unless the authorization is terminated or revoked.  Performed at [facility], [place] [address], [place], [place] [address]   SARS CORONAVIRUS 2 (TAT 6-24 HRS) Nasopharyngeal Nasopharyngeal Swab     Status: None   Collection Time: 01/16/21  4:23  PM   Specimen: Nasopharyngeal Swab  Result Value Ref Range Status   SARS Coronavirus 2 NEGATIVE NEGATIVE Final    Comment: (NOTE) SARS-CoV-2 target nucleic acids are NOT DETECTED.  The SARS-CoV-2 RNA is generally detectable in upper and lower respiratory specimens during the acute phase of infection. Negative results do not preclude SARS-CoV-2 infection, do not rule out co-infections with other pathogens, and should not be used as the sole basis for treatment or other patient management decisions. Negative results must be combined with clinical observations, patient history, and epidemiological information. The expected result is Negative.  Fact Sheet for Patients: SugarRoll.be  Fact Sheet for Healthcare Providers: https://www.woods-mathews.com/  This test is not yet approved or cleared by the [place] FDA and  has been authorized for detection and/or diagnosis of SARS-CoV-2 by FDA under an Emergency Use Authorization (EUA). This EUA will remain  in effect (meaning this test can be used) for the duration of the COVID-19 declaration under Se ction 564(b)(1) of the Act, 21 U.S.C. section 360bbb-3(b)(1), unless the authorization is terminated or revoked sooner.  Performed at [facility], [place] [address] [place], [place] [address]   Culture, blood (routine x 2)     Status: None   Collection Time: 01/17/21 12:46 PM   Specimen: Right Antecubital; Blood  Result Value Ref Range Status   Specimen Description   Final    RIGHT ANTECUBITAL Performed at [place] [address], [place], [place] [address]    Special Requests   Final    BOTTLES DRAWN AEROBIC ONLY Blood Culture adequate volume Performed at [facility] [address] [place], [place] [address]    Culture   Final    NO GROWTH 5 DAYS Performed at [facility], [place] [address], [place], [place] [address]    Report  Status 01/22/2021 FINAL  Final  Culture, blood (routine x  2)     Status: None   Collection Time: 01/17/21 12:46 PM   Specimen: BLOOD RIGHT HAND  Result Value Ref Range Status   Specimen Description   Final    BLOOD RIGHT HAND Performed at [facility] [address] [place], [place] [address]    Special Requests   Final    BOTTLES DRAWN AEROBIC ONLY Blood Culture adequate volume Performed at [place] [address] [place], [place] [address]    Culture   Final    NO GROWTH 5 DAYS Performed at [facility], [place] [address], [place], [place] [address]    Report Status 01/22/2021 FINAL  Final  Urine Culture     Status: Abnormal   Collection Time: 01/17/21  1:28 PM   Specimen: Urine, Clean Catch  Result Value Ref Range Status   Specimen Description   Final    URINE, CLEAN CATCH Performed at [facility], [place] [address] [place], [place] [address]    Special Requests   Final    NONE Performed at [facility], [place] [address] [place], [place] [address]    Culture (A)  Final    >=100,000 COLONIES/mL ESCHERICHIA COLI >=100,000 COLONIES/mL ENTEROBACTER CLOACAE    Report Status 01/20/2021 FINAL  Final   Organism ID, Bacteria ESCHERICHIA COLI (A)  Final   Organism ID, Bacteria ENTEROBACTER CLOACAE (A)  Final      Susceptibility   Enterobacter cloacae - MIC*    CEFAZOLIN >=64 RESISTANT Resistant     CEFEPIME <=0.12 SENSITIVE Sensitive     CIPROFLOXACIN <=0.25 SENSITIVE Sensitive     GENTAMICIN <=1 SENSITIVE Sensitive     IMIPENEM <=0.25 SENSITIVE Sensitive     NITROFURANTOIN 64 INTERMEDIATE Intermediate     TRIMETH/SULFA <=20 SENSITIVE Sensitive     PIP/TAZO <=4 SENSITIVE Sensitive     * >=100,000 COLONIES/mL ENTEROBACTER CLOACAE   Escherichia coli - MIC*    AMPICILLIN <=2 SENSITIVE Sensitive     CEFAZOLIN <=4 SENSITIVE Sensitive     CEFEPIME <=0.12 SENSITIVE Sensitive     CEFTRIAXONE  <=0.25 SENSITIVE Sensitive     CIPROFLOXACIN <=0.25 SENSITIVE Sensitive     GENTAMICIN <=1 SENSITIVE Sensitive     IMIPENEM <=0.25 SENSITIVE Sensitive     NITROFURANTOIN <=16 SENSITIVE Sensitive     TRIMETH/SULFA <=20 SENSITIVE Sensitive     AMPICILLIN/SULBACTAM <=2 SENSITIVE Sensitive     PIP/TAZO <=4 SENSITIVE Sensitive     * >=100,000 COLONIES/mL ESCHERICHIA COLI     Radiology Studies: No results found. DG Chest Port 1 View  Final Result    MR BRAIN WO CONTRAST  Final Result    CT HEAD WO CONTRAST  Final Result      Scheduled Meds:  amLODipine  10 mg Oral Daily   aspirin EC  81 mg Oral QHS   atorvastatin  40 mg Oral Daily   chlorhexidine  15 mL Mouth Rinse BID   clopidogrel  75 mg Oral Daily   heparin injection (subcutaneous)  5,000 Units Subcutaneous Q8H   insulin aspart  0-15 Units Subcutaneous TID WC   insulin glargine-yfgn  8 Units Subcutaneous QHS   LORazepam  1 mg Intravenous TID   mouth rinse  15 mL Mouth Rinse q12n4p   mycophenolate  360 mg Oral Daily   And   mycophenolate  180 mg Oral Q2200   nortriptyline  100 mg Oral QHS   pantoprazole  40 mg Oral  Daily   tacrolimus  5 mg Oral BID   thiamine  100 mg Oral Daily   PRN Meds: acetaminophen **OR** acetaminophen, bisacodyl, metoprolol tartrate Continuous Infusions:   LOS: 9 days  Time spent: Greater than 50% of the 35 minute visit was spent in counseling/coordination of care for the patient as laid out in the A&P.   Dwyane Dee, MD [facility] 01/23/2021, 2:40 PM

## 2021-01-24 ENCOUNTER — Inpatient Hospital Stay (HOSPITAL_COMMUNITY)
Admit: 2021-01-24 | Discharge: 2021-01-24 | Disposition: A | Discharge status: Home / Self Care | Payer: Medicare Other | Attending: Internal Medicine | Admitting: Internal Medicine

## 2021-01-24 LAB — CBC WITH DIFFERENTIAL/PLATELET
Abs Immature Granulocytes: 0.02 10*3/uL (ref 0.00–0.07)
Basophils Absolute: 0 10*3/uL (ref 0.0–0.1)
Basophils Relative: 0 %
Eosinophils Absolute: 0.1 10*3/uL (ref 0.0–0.5)
Eosinophils Relative: 1 %
HCT: 52.4 % — ABNORMAL HIGH (ref 39.0–52.0)
Hemoglobin: 16.6 g/dL (ref 13.0–17.0)
Immature Granulocytes: 0 %
Lymphocytes Relative: 10 %
Lymphs Abs: 0.5 10*3/uL — ABNORMAL LOW (ref 0.7–4.0)
MCH: 26.8 pg (ref 26.0–34.0)
MCHC: 31.7 g/dL (ref 30.0–36.0)
MCV: 84.5 fL (ref 80.0–100.0)
Monocytes Absolute: 0.3 10*3/uL (ref 0.1–1.0)
Monocytes Relative: 7 %
Neutro Abs: 3.8 10*3/uL (ref 1.7–7.7)
Neutrophils Relative %: 82 %
Platelets: 286 10*3/uL (ref 150–400)
RBC: 6.2 MIL/uL — ABNORMAL HIGH (ref 4.22–5.81)
RDW: 14.7 % (ref 11.5–15.5)
WBC: 4.7 10*3/uL (ref 4.0–10.5)
nRBC: 0 % (ref 0.0–0.2)

## 2021-01-24 LAB — BLOOD GAS, ARTERIAL
Acid-base deficit: 1.1 mmol/L (ref 0.0–2.0)
Allens test (pass/fail): POSITIVE — AB
Bicarbonate: 22.6 mmol/L (ref 20.0–28.0)
O2 Saturation: 94.4 %
Patient temperature: 98.6
pCO2 arterial: 36.4 mmHg (ref 32.0–48.0)
pH, Arterial: 7.409 (ref 7.350–7.450)
pO2, Arterial: 76.1 mmHg — ABNORMAL LOW (ref 83.0–108.0)

## 2021-01-24 LAB — COMPREHENSIVE METABOLIC PANEL
ALT: 50 U/L — ABNORMAL HIGH (ref 0–44)
AST: 37 U/L (ref 15–41)
Albumin: 3.3 g/dL — ABNORMAL LOW (ref 3.5–5.0)
Alkaline Phosphatase: 68 U/L (ref 38–126)
Anion gap: 11 (ref 5–15)
BUN: 20 mg/dL (ref 8–23)
CO2: 19 mmol/L — ABNORMAL LOW (ref 22–32)
Calcium: 10.1 mg/dL (ref 8.9–10.3)
Chloride: 109 mmol/L (ref 98–111)
Creatinine, Ser: 1.35 mg/dL — ABNORMAL HIGH (ref 0.61–1.24)
GFR, Estimated: 56 mL/min — ABNORMAL LOW (ref >60)
Glucose, Bld: 205 mg/dL — ABNORMAL HIGH (ref 70–99)
Potassium: 4.5 mmol/L (ref 3.5–5.1)
Sodium: 139 mmol/L (ref 135–145)
Total Bilirubin: 1 mg/dL (ref 0.3–1.2)
Total Protein: 7.3 g/dL (ref 6.5–8.1)

## 2021-01-24 LAB — GLUCOSE, CAPILLARY
Glucose-Capillary: 209 mg/dL — ABNORMAL HIGH (ref 70–99)
Glucose-Capillary: 215 mg/dL — ABNORMAL HIGH (ref 70–99)
Glucose-Capillary: 221 mg/dL — ABNORMAL HIGH (ref 70–99)
Glucose-Capillary: 198 mg/dL — ABNORMAL HIGH (ref 70–99)
Glucose-Capillary: 181 mg/dL — ABNORMAL HIGH (ref 70–99)
Glucose-Capillary: 155 mg/dL — ABNORMAL HIGH (ref 70–99)

## 2021-01-24 LAB — MAGNESIUM: Magnesium: 1.8 mg/dL (ref 1.7–2.4)

## 2021-01-24 MED ORDER — LORAZEPAM 2 MG/ML IJ SOLN
1.0000 mg | Freq: Three times a day (TID) | INTRAMUSCULAR | Status: DC
  Administered 2021-01-24: 1 mg via INTRAVENOUS
  Filled 2021-01-24: qty 1

## 2021-01-24 MED ORDER — ASPIRIN 81 MG PO CHEW
81.0000 mg | CHEWABLE_TABLET | Freq: Every day | ORAL | Status: DC
  Administered 2021-01-25 – 2021-01-28 (×4): 81 mg via ORAL
  Filled 2021-01-24 (×5): qty 1

## 2021-01-24 NOTE — Progress Notes (Signed)
Progress Note    [name]   [id number]  DOB: [birth year]  DOA: 01/13/2021     10  PCP: [name], MD  Initial CC: Altered mental status  Hospital Course: [name] is a 70 yo male with PMH CKD III s/p renal transplant on tacrolimus and mycophenolate, dCHF, CAD, HTN, HLD, OSA, CVA with left-sided weakness, diabetes with nephropathy, prostate cancer who was brought to the ED due to altered mental status.   Patient lives with son at home, EMS was called and he was hypotensive with blood pressure in the 80s, IV fluids were started and he was brought to the ED. In the ED he was noted to be alert awake and oriented blood work showed AKI on CKD, UA negative for nitrates and leukocyte esterase, UDS is positive for benzodiazepine; ammonia 12, CT head no acute finding.   APS became involved due to patient's living conditions at home and clinical status on arrival. His mentation worsened after admission and he underwent further work-up for his encephalopathy.  MRI brain was performed on 01/17/2021 and negative for acute stroke but did show mild chronic small vessel ischemic disease and cerebral atrophy.  Also chronic left cerebellar infarcts. He then developed new fever during hospitalization and repeat UA was obtained.  This showed moderate leukocyte esterase and 21-50 WBCs with many bacteria.  He was started on Zosyn.  Urine cultures grew E. coli and Enterobacter cloacae.  He completed course of Zosyn during hospitalization.  His mentation improved some but did not return back to his normal baseline.  Interval History:  No events overnight.  It was reported that his mentation improved some yesterday briefly after being started on the Ativan trial.  This morning he is still withdrawn and just staring around now unable to even tell me his name or follow any commands.  ROS: Constitutional: negative for chills and fevers, Respiratory: negative for cough and sputum, Cardiovascular:  negative for chest pain, and Gastrointestinal: negative  Assessment & Plan: * Acute metabolic encephalopathy - As of 01/22/2021 he is oriented to his name, year, city and son's name who visited, [name]. -Etiology is likely mixed in setting of nutrient deficiencies, resolved infection, dehydration on admission with ketosis, and underlying cerebral atrophy - other notable workup: B12 414, folate 11.3 - will send off B1 level as well - this current mentation may be patient's new baseline; have communicated this to family - some concern for possible catatonia on 9/1. He had +benzo on UDS on admission and is on consistent valium at home; no valium since admission and patient was noted to be talking normally on admission then had a mentation change; some concern for benzo withdrawal although half life for valium is quite long; will perform an ativan trial and see if he improves  Acute renal failure superimposed on stage 3b chronic kidney disease ([place]) - patient has history of CKD3b. Baseline creat ~ 1.6 - 1.7, eGFR 40 - patient presents with increase in creat >0.3 mg/dL above baseline, creat increase >1.5x baseline presumed to have occurred within past 7 days PTA -Presumed prerenal from dehydration on admission - Creatinine peaked at 3.7 and has returned back to baseline   Acute lower UTI-resolved as of 01/22/2021 - Urine cultures grew E. coli and Enterobacter cloacae - Completed Zosyn course during hospitalization  Thyroid nodule - Last thyroid ultrasound 2017 noted with bilateral nodules with recommendation for 1 year follow-up.  Do not see a repeat ultrasound since then - TSH =  2.179 on 01/17/21 - will have patient follow up outpatient for repeat u/s  Physical deconditioning - Evaluated by PT/OT.  Plan is for discharge to SNF  Anxiety - d/c valium - ativan trial as above; if responds will need long taper off benzos  Chronic diastolic CHF (congestive heart failure) (HCC) - No signs or  symptoms of exacerbation -Last echo July 2021: EF greater than 09%, grade 1 diastolic dysfunction  DM (diabetes mellitus) type II controlled with renal manifestation (HCC) - Continue SSI and CBG monitoring -A1c 8.2% on 01/14/2021  Renal transplant recipient - Evaluated by nephrology - Tacrolimus level appropriate - Outpatient follow-up with transplant team  GERD (gastroesophageal reflux disease) - Continue Protonix  History of stroke - continue asa and plavix - MRI brain 8/26: negative for acute stroke.  Mild chronic small vessel ischemic disease and cerebral atrophy.  Chronic left cerebellar infarcts.  CAD -s/p DES to marginal vessel at [facility] 2014 - Continue aspirin, Plavix, statin  Gout - no signs of flare  Hypertensive urgency-resolved as of 01/22/2021 - Resolved with treatment   Old records reviewed in assessment of this patient  Antimicrobials: Zosyn 01/17/2021 >> 01/22/2021  DVT prophylaxis: heparin injection 5,000 Units Start: 01/15/21 1400 SCDs Start: 01/14/21 0740   Code Status:   Code Status: Full Code Family Communication: son, [name]   Disposition Plan: Status is: Inpatient  Remains inpatient appropriate because:Unsafe d/c plan and Inpatient level of care appropriate due to severity of illness  Dispo: The patient is from: Home              Anticipated d/c is to: SNF              Patient currently is not medically stable to d/c.   Difficult to place patient No Risk of unplanned readmission score: Unplanned Admission- Pilot do not use: 20.69   Objective: Blood pressure 128/86, pulse (!) 103, temperature 97.9 F (36.6 C), temperature source Oral, resp. rate 18, height 5' 10.5" (1.791 m), weight 94.5 kg, SpO2 93 %.  Examination: General appearance:  Staring around the room but unable to follow commands or interact.  Not saying any words at all today Head: Normocephalic, without obvious abnormality, atraumatic Eyes:  EOMI Lungs: clear to auscultation  bilaterally Heart: regular rate and rhythm and S1, S2 normal Abdomen: normal findings: bowel sounds normal and soft, non-tender Extremities:  no edema Skin: mobility and turgor normal Neurologic: Moving all 4 extremities spontaneously but does not follow commands  Consultants:    Procedures:    Data Reviewed: I have personally reviewed following labs and imaging studies Results for orders placed or performed during the hospital encounter of 01/13/21 (from the past 24 hour(s))  Glucose, capillary     Status: Abnormal   Collection Time: 01/23/21  4:15 PM  Result Value Ref Range   Glucose-Capillary 216 (H) 70 - 99 mg/dL  Glucose, capillary     Status: Abnormal   Collection Time: 01/23/21  7:50 PM  Result Value Ref Range   Glucose-Capillary 182 (H) 70 - 99 mg/dL  CBC with Differential/Platelet     Status: Abnormal   Collection Time: 01/24/21  7:09 AM  Result Value Ref Range   WBC 4.7 4.0 - 10.5 K/uL   RBC 6.20 (H) 4.22 - 5.81 MIL/uL   Hemoglobin 16.6 13.0 - 17.0 g/dL   HCT 52.4 (H) 39.0 - 52.0 %   MCV 84.5 80.0 - 100.0 fL   MCH 26.8 26.0 - 34.0 pg  MCHC 31.7 30.0 - 36.0 g/dL   RDW 14.7 11.5 - 15.5 %   Platelets 286 150 - 400 K/uL   nRBC 0.0 0.0 - 0.2 %   Neutrophils Relative % 82 %   Neutro Abs 3.8 1.7 - 7.7 K/uL   Lymphocytes Relative 10 %   Lymphs Abs 0.5 (L) 0.7 - 4.0 K/uL   Monocytes Relative 7 %   Monocytes Absolute 0.3 0.1 - 1.0 K/uL   Eosinophils Relative 1 %   Eosinophils Absolute 0.1 0.0 - 0.5 K/uL   Basophils Relative 0 %   Basophils Absolute 0.0 0.0 - 0.1 K/uL   Immature Granulocytes 0 %   Abs Immature Granulocytes 0.02 0.00 - 0.07 K/uL  Comprehensive metabolic panel     Status: Abnormal   Collection Time: 01/24/21  7:09 AM  Result Value Ref Range   Sodium 139 135 - 145 mmol/L   Potassium 4.5 3.5 - 5.1 mmol/L   Chloride 109 98 - 111 mmol/L   CO2 19 (L) 22 - 32 mmol/L   Glucose, Bld 205 (H) 70 - 99 mg/dL   BUN 20 8 - 23 mg/dL   Creatinine, Ser 1.35 (H)  0.61 - 1.24 mg/dL   Calcium 10.1 8.9 - 10.3 mg/dL   Total Protein 7.3 6.5 - 8.1 g/dL   Albumin 3.3 (L) 3.5 - 5.0 g/dL   AST 37 15 - 41 U/L   ALT 50 (H) 0 - 44 U/L   Alkaline Phosphatase 68 38 - 126 U/L   Total Bilirubin 1.0 0.3 - 1.2 mg/dL   GFR, Estimated 56 (L) >60 mL/min   Anion gap 11 5 - 15  Magnesium     Status: None   Collection Time: 01/24/21  7:09 AM  Result Value Ref Range   Magnesium 1.8 1.7 - 2.4 mg/dL  Glucose, capillary     Status: Abnormal   Collection Time: 01/24/21  7:24 AM  Result Value Ref Range   Glucose-Capillary 209 (H) 70 - 99 mg/dL  Glucose, capillary     Status: Abnormal   Collection Time: 01/24/21 11:46 AM  Result Value Ref Range   Glucose-Capillary 215 (H) 70 - 99 mg/dL  Glucose, capillary     Status: Abnormal   Collection Time: 01/24/21 12:13 PM  Result Value Ref Range   Glucose-Capillary 221 (H) 70 - 99 mg/dL    Recent Results (from the past 240 hour(s))  SARS CORONAVIRUS 2 (TAT 6-24 HRS) Nasopharyngeal Nasopharyngeal Swab     Status: None   Collection Time: 01/16/21  4:23 PM   Specimen: Nasopharyngeal Swab  Result Value Ref Range Status   SARS Coronavirus 2 NEGATIVE NEGATIVE Final    Comment: (NOTE) SARS-CoV-2 target nucleic acids are NOT DETECTED.  The SARS-CoV-2 RNA is generally detectable in upper and lower respiratory specimens during the acute phase of infection. Negative results do not preclude SARS-CoV-2 infection, do not rule out co-infections with other pathogens, and should not be used as the sole basis for treatment or other patient management decisions. Negative results must be combined with clinical observations, patient history, and epidemiological information. The expected result is Negative.  Fact Sheet for Patients: SugarRoll.be  Fact Sheet for Healthcare Providers: https://www.woods-mathews.com/  This test is not yet approved or cleared by the [place] FDA and  has been  authorized for detection and/or diagnosis of SARS-CoV-2 by FDA under an Emergency Use Authorization (EUA). This EUA will remain  in effect (meaning this test can be used) for the  duration of the COVID-19 declaration under Se ction 564(b)(1) of the Act, 21 U.S.C. section 360bbb-3(b)(1), unless the authorization is terminated or revoked sooner.  Performed at [facility], [place] [address], [place], [place] [address]   Culture, blood (routine x 2)     Status: None   Collection Time: 01/17/21 12:46 PM   Specimen: Right Antecubital; Blood  Result Value Ref Range Status   Specimen Description   Final    RIGHT ANTECUBITAL Performed at [facility] [address], [place], [place] [address]    Special Requests   Final    BOTTLES DRAWN AEROBIC ONLY Blood Culture adequate volume Performed at [facility] [address] [place], [place] [address]    Culture   Final    NO GROWTH 5 DAYS Performed at [facility], [place] [address] [place], [place] [address]    Report Status 01/22/2021 FINAL  Final  Culture, blood (routine x 2)     Status: None   Collection Time: 01/17/21 12:46 PM   Specimen: BLOOD RIGHT HAND  Result Value Ref Range Status   Specimen Description   Final    BLOOD RIGHT HAND Performed at [facility] [address], [place], [place] [address]    Special Requests   Final    BOTTLES DRAWN AEROBIC ONLY Blood Culture adequate volume Performed at [facility] [address], [place], [place] [address]    Culture   Final    NO GROWTH 5 DAYS Performed at [facility], [place] [address] [place], [place] [address]    Report Status 01/22/2021 FINAL  Final  Urine Culture     Status: Abnormal   Collection Time: 01/17/21  1:28 PM   Specimen: Urine, Clean Catch  Result Value Ref Range Status   Specimen Description   Final    URINE, CLEAN CATCH Performed at [facility],  [place] [address] [place], [place] [address]    Special Requests   Final    NONE Performed at [facility], [place] [address], [place], [place] [address]    Culture (A)  Final    >=100,000 COLONIES/mL ESCHERICHIA COLI >=100,000 COLONIES/mL ENTEROBACTER CLOACAE    Report Status 01/20/2021 FINAL  Final   Organism ID, Bacteria ESCHERICHIA COLI (A)  Final   Organism ID, Bacteria ENTEROBACTER CLOACAE (A)  Final      Susceptibility   Enterobacter cloacae - MIC*    CEFAZOLIN >=64 RESISTANT Resistant     CEFEPIME <=0.12 SENSITIVE Sensitive     CIPROFLOXACIN <=0.25 SENSITIVE Sensitive     GENTAMICIN <=1 SENSITIVE Sensitive     IMIPENEM <=0.25 SENSITIVE Sensitive     NITROFURANTOIN 64 INTERMEDIATE Intermediate     TRIMETH/SULFA <=20 SENSITIVE Sensitive     PIP/TAZO <=4 SENSITIVE Sensitive     * >=100,000 COLONIES/mL ENTEROBACTER CLOACAE   Escherichia coli - MIC*    AMPICILLIN <=2 SENSITIVE Sensitive     CEFAZOLIN <=4 SENSITIVE Sensitive     CEFEPIME <=0.12 SENSITIVE Sensitive     CEFTRIAXONE <=0.25 SENSITIVE Sensitive     CIPROFLOXACIN <=0.25 SENSITIVE Sensitive     GENTAMICIN <=1 SENSITIVE Sensitive     IMIPENEM <=0.25 SENSITIVE Sensitive     NITROFURANTOIN <=16 SENSITIVE Sensitive     TRIMETH/SULFA <=20 SENSITIVE Sensitive     AMPICILLIN/SULBACTAM <=2 SENSITIVE Sensitive     PIP/TAZO <=4 SENSITIVE Sensitive     * >=100,000 COLONIES/mL  ESCHERICHIA COLI     Radiology Studies: No results found. DG Chest Port 1 View  Final Result    MR BRAIN WO CONTRAST  Final Result    CT HEAD WO CONTRAST  Final Result      Scheduled Meds:  amLODipine  10 mg Oral Daily   aspirin  81 mg Oral QHS   atorvastatin  40 mg Oral Daily   chlorhexidine  15 mL Mouth Rinse BID   clopidogrel  75 mg Oral Daily   heparin injection (subcutaneous)  5,000 Units Subcutaneous Q8H   insulin aspart  0-15 Units Subcutaneous TID WC   insulin glargine-yfgn  8 Units Subcutaneous QHS   LORazepam   1 mg Intravenous TID   mouth rinse  15 mL Mouth Rinse q12n4p   mycophenolate  360 mg Oral Daily   And   mycophenolate  180 mg Oral Q2200   nortriptyline  100 mg Oral QHS   pantoprazole  40 mg Oral Daily   tacrolimus  5 mg Oral BID   thiamine  100 mg Oral Daily   PRN Meds: acetaminophen **OR** acetaminophen, bisacodyl, metoprolol tartrate Continuous Infusions:   LOS: 10 days  Time spent: Greater than 50% of the 35 minute visit was spent in counseling/coordination of care for the patient as laid out in the A&P.   Dwyane Dee, MD [facility] 01/24/2021, 3:15 PM

## 2021-01-24 NOTE — Progress Notes (Signed)
Occupational Therapy Treatment Patient Details Name: [name] MRN: [id number] DOB: 1951-01-24 Today's Date: 01/24/2021    History of present illness Patient is a 70 yo male admitted with encephalopathy, AKI, hypertensive urgency. Hx of CHF, frequent falls, chronic L sided weakness, diabetic neuropathy, prostate ca, MMI, CKD, renal transplant, CVA   OT comments  OT/PTA in room to co-treat. Patient now presents in near "catatonic state" according to MD note. Patient's eyes open and meets therapist eyes. Appears to understand therapist but unable to physically respond initially - though unable to really assess cognition. Patient total assist to feed per tech and patient unable to place lips over straw and initiate drinking. Patient unable to move upper extremities to wash face. Total assist for grooming. Patient initially does not move upper extremities and therapist passively ranged LUE and opened fingers as it was in a flexed position. After several minutes of tactile and verbal cues to perform any task or movement, patient used left hand to purposefully touch the top of his head and reach for therapist belt on command (though not consistently).  Patient total assist for transfers and squat pivot to [facility] and then to recliner. +3 needed for toileting for partial stand and perianal care. Patient's arms placed on pillows for positioning and left hand fingers placed in extension. Patient partially raised right hand and moved fingers as if in "goodbye."   Will await and see if patient comes out of this "frozen" state though goals may need to be downgraded on next visit if he doesn't improve. Continue to recommend SNF at discharge.   Follow Up Recommendations  SNF    Equipment Recommendations  Other (comment) (defer to next venue)    Recommendations for Other Services      Precautions / Restrictions Precautions Precautions: Fall Precaution Comments: frequent falls per  chart Restrictions Weight Bearing Restrictions: No       Mobility Bed Mobility Overal bed mobility: Needs Assistance Bed Mobility: Supine to Sit     Supine to sit: Total assist;+2 for safety/equipment;+2 for physical assistance;HOB elevated     General bed mobility comments: total assist x 2 to transfer to side of bed. Exhibiting posterior initial lean and needing assistance to sit up.    Transfers   Equipment used: None Transfers: Public house manager     Squat pivot transfers: Total assist;+2 physical assistance;+2 safety/equipment     General transfer comment: Total to pivot to [facility] then to recliner.    Balance Overall balance assessment: Needs assistance Sitting-balance support: No upper extremity supported;Feet supported Sitting balance-Leahy Scale: Poor                                     ADL either performed or assessed with clinical judgement   ADL Overall ADL's : Needs assistance/impaired Eating/Feeding: Total assistance;Set up Eating/Feeding Details (indicate cue type and reason): total assist for feeding, drinking, exhibited delayed swallow Grooming: Total assistance;Wash/dry face Grooming Details (indicate cue type and reason): total assist to wash face                 Toilet Transfer: Total assistance;+2 for physical assistance;+2 for safety/equipment;BSC;Squat-pivot Toilet Transfer Details (indicate cue type and reason): total x 2 for squat pivot to BSC. Toileting- Clothing Manipulation and Hygiene: Total assistance;Sit to/from stand;+2 for physical assistance;+2 for safety/equipment Toileting - Clothing Manipulation Details (indicate cue type and reason): incontinent of BM. +3 to  stand patient for perianal care     Functional mobility during ADLs: Total assistance;+2 for physical assistance;+2 for safety/equipment       Vision   Vision Assessment?: No apparent visual deficits   Perception     Praxis      Cognition  Arousal/Alertness: Awake/alert Behavior During Therapy: Flat affect Overall Cognitive Status: No family/caregiver present to determine baseline cognitive functioning                                 General Comments: Patient did not speak or exhibit any facial features exhibit for grimacing in discomfort. Per MD note - patient exhibiting some form of catatonic state.        Exercises     Shoulder Instructions       General Comments      Pertinent Vitals/ Pain       Pain Assessment: Faces Faces Pain Scale: Hurts a little bit Pain Location: grimacing with being repositioned Pain Descriptors / Indicators: Grimacing Pain Intervention(s): Monitored during session  Home Living                                          Prior Functioning/Environment              Frequency  Min 2X/week        Progress Toward Goals  OT Goals(current goals can now be found in the care plan section)  Progress towards OT goals: Not progressing toward goals - comment  Acute Rehab OT Goals OT Goal Formulation: Patient unable to participate in goal setting Time For Goal Achievement: 01/30/21 Potential to Achieve Goals: [place] Discharge plan remains appropriate    Co-evaluation    PT/OT/SLP Co-Evaluation/Treatment: Yes   PT goals addressed during session: Mobility/safety with mobility OT goals addressed during session: ADL's and self-care      AM-PAC OT "6 Clicks" Daily Activity     Outcome Measure   Help from another person eating meals?: Total Help from another person taking care of personal grooming?: Total Help from another person toileting, which includes using toliet, bedpan, or urinal?: Total Help from another person bathing (including washing, rinsing, drying)?: Total Help from another person to put on and taking off regular upper body clothing?: Total Help from another person to put on and taking off regular lower body clothing?: Total 6  Click Score: 6    End of Session Equipment Utilized During Treatment: Gait belt  OT Visit Diagnosis: Unsteadiness on feet (R26.81);Muscle weakness (generalized) (M62.81);History of falling (Z91.81);Other symptoms and signs involving cognitive function   Activity Tolerance Patient tolerated treatment well   Patient Left in chair;with call bell/phone within reach;with chair alarm set   Nurse Communication Mobility status        Time: [id number] OT Time Calculation (min): 14 min  Charges: OT General Charges $OT Visit: 1 Visit OT Treatments $Self Care/Home Management : 8-22 mins  Derl Barrow, OTR/L [place]  Office [phone] Pager: [place] 01/24/2021, 11:10 AM

## 2021-01-24 NOTE — Procedures (Signed)
Patient Name: [name]  MRN: [id number]  Epilepsy Attending: Lora Havens  Referring Physician/Provider: Dwyane Dee Date: 01/24/2021 Duration: 23.50 mins  Patient history: 70yo M with ams. EEG to evaluate for seizure  Level of alertness: Asleep, lethargic   AEDs during EEG study: None  Technical aspects: This EEG study was done with scalp electrodes positioned according to the 10-20 International system of electrode placement. Electrical activity was acquired at a sampling rate of '500Hz'$  and reviewed with a high frequency filter of '70Hz'$  and a low frequency filter of '1Hz'$ . EEG data were recorded continuously and digitally stored.   Description: No posterior dominant rhythm was seen. Sleep was characterized by vertex waves, sleep spindles (12 to 14 Hz), maximal frontocentral region.  EEG showed continuous generalized 3 to 6 Hz theta-delta slowing.  Hyperventilation and photic stimulation were not performed.     ABNORMALITY - Continuous slow, generalized  IMPRESSION: This study is suggestive of moderate diffuse encephalopathy, nonspecific etiology. No seizures or epileptiform discharges were seen throughout the recording.  [name]

## 2021-01-24 NOTE — TOC Progression Note (Signed)
Transition of Care [facility]) - Progression Note    Patient Details  Name: [name] MRN: [id number] Date of Birth: November 27, 1950  Transition of Care [facility] CM/SW Contact  [name], [name] Phone Number: 01/24/2021, 12:59 PM  Clinical Narrative:     CSW spoke to [name] at [name] to see if patient can discharge over weekend.  Per SNF, they can not take over weekend, [name] will have to be restarted on Sunday.  If auth received [name] can accept on Monday, CSW asked PT to see over weekend.  Insurance [name] can be restarted on Sunday, since it is only valid through Saturday 01/25/2021.  CSW to continue to follow patient's progress throughout discharge planning.  Expected Discharge Plan: Skilled Nursing Facility Barriers to Discharge: Insurance Authorization  Expected Discharge Plan and Services Expected Discharge Plan: [name]   Discharge Planning Services: CM Consult   Living arrangements for the past 2 months: Single Family Home                                       Social Determinants of Health (SDOH) Interventions    Readmission Risk Interventions No flowsheet data found.

## 2021-01-24 NOTE — Plan of Care (Signed)
  Problem: Clinical Measurements: Goal: Ability to maintain clinical measurements within normal limits will improve Outcome: Progressing   Problem: Activity: Goal: Risk for activity intolerance will decrease Outcome: Progressing   Problem: Coping: Goal: Level of anxiety will decrease Outcome: Progressing   

## 2021-01-24 NOTE — Progress Notes (Signed)
EEG completed, results pending. 

## 2021-01-24 NOTE — Progress Notes (Signed)
Physical Therapy Treatment Patient Details Name: [name] MRN: [id number] DOB: 1951/05/16 Today's Date: 01/24/2021    History of Present Illness Patient is a 70 yo male admitted with encephalopathy, AKI, hypertensive urgency. Hx of CHF, frequent falls, chronic L sided weakness, diabetic neuropathy, prostate ca, MMI, CKD, renal transplant, CVA    PT Comments    General Comments: Pt awake but with poor eye contact and brief moments of grimmacing with mobility.  Remained Non Verbal and catatonic throughout session. Co Treat with OT Attempted to increase arousal with stimulation but unsuccessful.  Assisted to EOB required Total Assist.  General bed mobility comments: total assist x 2 to transfer to side of bed. Exhibiting posterior initial lean and needing assistance to sit up.  Pt offered 0% assist but did static sit after midline positioning.  Slow balance drifing with no correction responce. Pt found to be incont stool so assisted to [facility].  General transfer comment: Total to pivot to [facility] then to recliner vis "Bear Hug" and blocking L knee.  Required a third assist for hygiene as pt was unable to support self in partial standing.  Positioned upright in recliner to comfort.  Pt remained catatonic throughout session.  Pt will need [facility] at SNF prior to safely returning home with family.   Follow Up Recommendations  SNF;Supervision/Assistance - 24 hour     Equipment Recommendations  None recommended by PT    Recommendations for Other Services       Precautions / Restrictions Precautions Precautions: Fall Precaution Comments: frequent falls per chart Restrictions Weight Bearing Restrictions: No    Mobility  Bed Mobility Overal bed mobility: Needs Assistance Bed Mobility: Supine to Sit     Supine to sit: Total assist;+2 for safety/equipment;+2 for physical assistance;HOB elevated     General bed mobility comments: total assist x 2 to transfer to side of bed. Exhibiting  posterior initial lean and needing assistance to sit up.  Pt offered 0% assist but did static sit after midline positioning.  Slow balance drifing with no correction responce.    Transfers Overall transfer level: Needs assistance Equipment used: None Transfers: Squat Pivot Transfers Sit to Stand: Total assist;+2 safety/equipment;+2 physical assistance;From elevated surface Stand pivot transfers: Max assist;+2 physical assistance Squat pivot transfers: Total assist;+2 physical assistance;+2 safety/equipment     General transfer comment: Total to pivot to [facility] then to recliner vis "Bear Hug" and blocking L knee.  Ambulation/Gait             General Gait Details: unable   Stairs             Wheelchair Mobility    Modified Rankin (Stroke Patients Only)       Balance Overall balance assessment: Needs assistance Sitting-balance support: No upper extremity supported;Feet supported Sitting balance-Leahy Scale: Poor                                      Cognition Arousal/Alertness: Awake/alert;Lethargic Behavior During Therapy: Flat affect Overall Cognitive Status: No family/caregiver present to determine baseline cognitive functioning Area of Impairment: Orientation;Following commands;Safety/judgement;Problem solving                               General Comments: Pt awake but with poor eye contact and brief moments of grimmacing with mobility.  Remained Non Verbal and catatonic throughout session.  Exercises      General Comments        Pertinent Vitals/Pain Pain Assessment: No/denies pain Faces Pain Scale: Hurts a little bit Pain Location: very little grimacing with being repositioned od B knees into extension and L hand ROM Pain Descriptors / Indicators: Grimacing Pain Intervention(s): Monitored during session    Home Living                      Prior Function            PT Goals (current goals can now be  found in the care plan section) Progress towards PT goals: Progressing toward goals    Frequency    Min 2X/week      PT Plan Current plan remains appropriate    Co-evaluation     PT goals addressed during session: Mobility/safety with mobility OT goals addressed during session: ADL's and self-care      AM-PAC PT "6 Clicks" Mobility   Outcome Measure  Help needed turning from your back to your side while in a flat bed without using bedrails?: Total Help needed moving from lying on your back to sitting on the side of a flat bed without using bedrails?: Total Help needed moving to and from a bed to a chair (including a wheelchair)?: Total Help needed standing up from a chair using your arms (e.g., wheelchair or bedside chair)?: Total Help needed to walk in hospital room?: Total Help needed climbing 3-5 steps with a railing? : Total 6 Click Score: 6    End of Session Equipment Utilized During Treatment: Gait belt Activity Tolerance: Patient tolerated treatment well;Patient limited by fatigue Patient left: in chair;with call bell/phone within reach Nurse Communication: Mobility status;Need for lift equipment PT Visit Diagnosis: History of falling (Z91.81);Muscle weakness (generalized) (M62.81)     Time: [id number] PT Time Calculation (min) (ACUTE ONLY): 29 min  Charges:  $Therapeutic Activity: 23-37 mins                     {Kyanne Rials  PTA Acute  Rehabilitation Services Pager      [phone] Office      [phone]

## 2021-01-24 NOTE — Care Management Important Message (Signed)
Important Message  Patient Details IM Letter given to the Patient Name: [name] MRN: [id number] Date of Birth: 06/20/1950   Medicare Important Message Given:  Yes     Kerin Salen 01/24/2021, 1:53 PM

## 2021-01-25 LAB — CBC WITH DIFFERENTIAL/PLATELET
Abs Immature Granulocytes: 0.02 10*3/uL (ref 0.00–0.07)
Basophils Absolute: 0 10*3/uL (ref 0.0–0.1)
Basophils Relative: 0 %
Eosinophils Absolute: 0.1 10*3/uL (ref 0.0–0.5)
Eosinophils Relative: 2 %
HCT: 51.5 % (ref 39.0–52.0)
Hemoglobin: 16 g/dL (ref 13.0–17.0)
Immature Granulocytes: 0 %
Lymphocytes Relative: 16 %
Lymphs Abs: 0.8 10*3/uL (ref 0.7–4.0)
MCH: 26.6 pg (ref 26.0–34.0)
MCHC: 31.1 g/dL (ref 30.0–36.0)
MCV: 85.7 fL (ref 80.0–100.0)
Monocytes Absolute: 0.5 10*3/uL (ref 0.1–1.0)
Monocytes Relative: 11 %
Neutro Abs: 3.3 10*3/uL (ref 1.7–7.7)
Neutrophils Relative %: 71 %
Platelets: 257 10*3/uL (ref 150–400)
RBC: 6.01 MIL/uL — ABNORMAL HIGH (ref 4.22–5.81)
RDW: 15 % (ref 11.5–15.5)
WBC: 4.7 10*3/uL (ref 4.0–10.5)
nRBC: 0 % (ref 0.0–0.2)

## 2021-01-25 LAB — COMPREHENSIVE METABOLIC PANEL
ALT: 49 U/L — ABNORMAL HIGH (ref 0–44)
ALT: 50 U/L — ABNORMAL HIGH (ref 0–44)
AST: 36 U/L (ref 15–41)
AST: 36 U/L (ref 15–41)
Albumin: 3.2 g/dL — ABNORMAL LOW (ref 3.5–5.0)
Albumin: 3.1 g/dL — ABNORMAL LOW (ref 3.5–5.0)
Alkaline Phosphatase: 70 U/L (ref 38–126)
Alkaline Phosphatase: 64 U/L (ref 38–126)
Anion gap: 9 (ref 5–15)
Anion gap: 7 (ref 5–15)
BUN: 26 mg/dL — ABNORMAL HIGH (ref 8–23)
BUN: 27 mg/dL — ABNORMAL HIGH (ref 8–23)
CO2: 25 mmol/L (ref 22–32)
CO2: 23 mmol/L (ref 22–32)
Calcium: 10.1 mg/dL (ref 8.9–10.3)
Calcium: 10.5 mg/dL — ABNORMAL HIGH (ref 8.9–10.3)
Chloride: 106 mmol/L (ref 98–111)
Chloride: 112 mmol/L — ABNORMAL HIGH (ref 98–111)
Creatinine, Ser: 1.7 mg/dL — ABNORMAL HIGH (ref 0.61–1.24)
Creatinine, Ser: 1.73 mg/dL — ABNORMAL HIGH (ref 0.61–1.24)
GFR, Estimated: 42 mL/min — ABNORMAL LOW (ref >60)
GFR, Estimated: 41 mL/min — ABNORMAL LOW (ref >60)
Glucose, Bld: 164 mg/dL — ABNORMAL HIGH (ref 70–99)
Glucose, Bld: 183 mg/dL — ABNORMAL HIGH (ref 70–99)
Potassium: 4.4 mmol/L (ref 3.5–5.1)
Potassium: 4.7 mmol/L (ref 3.5–5.1)
Sodium: 140 mmol/L (ref 135–145)
Sodium: 142 mmol/L (ref 135–145)
Total Bilirubin: 1 mg/dL (ref 0.3–1.2)
Total Bilirubin: 0.8 mg/dL (ref 0.3–1.2)
Total Protein: 7.3 g/dL (ref 6.5–8.1)
Total Protein: 7 g/dL (ref 6.5–8.1)

## 2021-01-25 LAB — MAGNESIUM: Magnesium: 1.8 mg/dL (ref 1.7–2.4)

## 2021-01-25 LAB — AMMONIA: Ammonia: 42 umol/L — ABNORMAL HIGH (ref 9–35)

## 2021-01-25 LAB — GLUCOSE, CAPILLARY
Glucose-Capillary: 177 mg/dL — ABNORMAL HIGH (ref 70–99)
Glucose-Capillary: 163 mg/dL — ABNORMAL HIGH (ref 70–99)
Glucose-Capillary: 155 mg/dL — ABNORMAL HIGH (ref 70–99)

## 2021-01-25 MED ORDER — LACTULOSE ENEMA
300.0000 mL | Freq: Once | ORAL | Status: AC
  Administered 2021-01-25: 300 mL via RECTAL
  Filled 2021-01-25: qty 300

## 2021-01-25 MED ORDER — LACTATED RINGERS IV SOLN: INTRAVENOUS | Status: DC

## 2021-01-25 MED ORDER — LACTULOSE 10 GM/15ML PO SOLN
20.0000 g | Freq: Two times a day (BID) | ORAL | Status: DC
  Administered 2021-01-25 – 2021-01-29 (×9): 20 g via ORAL
  Filled 2021-01-25 (×9): qty 30

## 2021-01-25 NOTE — Progress Notes (Signed)
Progress Note    [name]   MNO:[id number]  DOB: 04/29/1951  DOA: 01/13/2021     11  PCP: [name], MD  Initial CC: Altered mental status  Hospital Course: Mr. Lasky is a 70 yo male with PMH CKD III s/p renal transplant on tacrolimus and mycophenolate, dCHF, CAD, HTN, HLD, OSA, CVA with left-sided weakness, diabetes with nephropathy, prostate cancer who was brought to the ED due to altered mental status.   Patient lives with son at home, EMS was called and he was hypotensive with blood pressure in the 80s, IV fluids were started and he was brought to the ED. In the ED he was noted to be alert awake and oriented blood work showed AKI on CKD, UA negative for nitrates and leukocyte esterase, UDS is positive for benzodiazepine; ammonia 12, CT head no acute finding.   APS became involved due to patient's living conditions at home and clinical status on arrival. His mentation worsened after admission and he underwent further work-up for his encephalopathy.  MRI brain was performed on 01/17/2021 and negative for acute stroke but did show mild chronic small vessel ischemic disease and cerebral atrophy.  Also chronic left cerebellar infarcts. He then developed new fever during hospitalization and repeat UA was obtained.  This showed moderate leukocyte esterase and 21-50 WBCs with many bacteria.  He was started on Zosyn.  Urine cultures grew E. coli and Enterobacter cloacae.  He completed course of Zosyn during hospitalization.  His mentation improved some but did not return back to his normal baseline.  Interval History:  Still the same as yesterday.  Staring around the room and is nonverbal and not following commands.  ROS: Constitutional: negative for chills and fevers, Respiratory: negative for cough and sputum, Cardiovascular: negative for chest pain, and Gastrointestinal: negative  Assessment & Plan: * Acute metabolic encephalopathy - As of 01/22/2021 he is oriented to his  name, year, city and son's name who visited, [name]. -Etiology is likely mixed in setting of nutrient deficiencies, resolved infection, dehydration on admission with ketosis, and underlying cerebral atrophy - other notable workup: B12 414, folate 11.3 - will send off B1 level as well - this current mentation may be patient's new baseline; have communicated this to family - some concern for possible catatonia on 9/1. He had +benzo on UDS on admission and is on consistent valium at home; no valium since admission and patient was noted to be talking normally on admission then had a mentation change; some concern for benzo withdrawal although half life for valium is quite long; no significant improvement with ativan trial and therefore discontinued - EEG checked on 9/2, also negative for seizure activity; shows moderate diffuse encephalopathy - NH3 normal on admission 8/22 and on 8/26; repeated 9/2 and mildly elevated 42 (treated with lactulose enema); will continue on BID lactulose for now and also monitor mentation response   Acute renal failure superimposed on stage 3b chronic kidney disease ([place]) - patient has history of CKD3b. Baseline creat ~ 1.6 - 1.7, eGFR 40 - patient presents with increase in creat >0.3 mg/dL above baseline, creat increase >1.5x baseline presumed to have occurred within past 7 days PTA -Presumed prerenal from dehydration on admission - Creatinine peaked at 3.7 and has returned back to baseline   Acute lower UTI-resolved as of 01/22/2021 - Urine cultures grew E. coli and Enterobacter cloacae - Completed Zosyn course during hospitalization  Thyroid nodule - Last thyroid ultrasound 2017 noted with bilateral  nodules with recommendation for 1 year follow-up.  Do not see a repeat ultrasound since then - TSH = 2.179 on 01/17/21 - will have patient follow up outpatient for repeat u/s  Physical deconditioning - Evaluated by PT/OT.  Plan is for discharge to SNF  Anxiety - d/c  valium - ativan trial as above (did not respond)  Chronic diastolic CHF (congestive heart failure) (HCC) - No signs or symptoms of exacerbation -Last echo July 2021: EF greater than 75%, grade 1 diastolic dysfunction  DM (diabetes mellitus) type II controlled with renal manifestation (HCC) - Continue SSI and CBG monitoring -A1c 8.2% on 01/14/2021  Renal transplant recipient - Evaluated by nephrology - Tacrolimus level appropriate - Outpatient follow-up with transplant team  GERD (gastroesophageal reflux disease) - Continue Protonix  History of stroke - continue asa and plavix - MRI brain 8/26: negative for acute stroke.  Mild chronic small vessel ischemic disease and cerebral atrophy.  Chronic left cerebellar infarcts.  CAD -s/p DES to marginal vessel at [facility] 2014 - Continue aspirin, Plavix, statin  Gout - no signs of flare  Hypertensive urgency-resolved as of 01/22/2021 - Resolved with treatment   Old records reviewed in assessment of this patient  Antimicrobials: Zosyn 01/17/2021 >> 01/22/2021  DVT prophylaxis: heparin injection 5,000 Units Start: 01/15/21 1400 SCDs Start: 01/14/21 0740   Code Status:   Code Status: Full Code Family Communication: son, [name]   Disposition Plan: Status is: Inpatient  Remains inpatient appropriate because:Unsafe d/c plan and Inpatient level of care appropriate due to severity of illness  Dispo: The patient is from: Home              Anticipated d/c is to: SNF              Patient currently is not medically stable to d/c.   Difficult to place patient No Risk of unplanned readmission score: Unplanned Admission- Pilot do not use: 23.45   Objective: Blood pressure 123/78, pulse 96, temperature (!) 97.5 F (36.4 C), temperature source Oral, resp. rate 18, height 5' 10.5" (1.791 m), weight 94.5 kg, SpO2 96 %.  Examination: General appearance:  Staring around the room but unable to follow commands or interact.  Not saying any words  at all today Head: Normocephalic, without obvious abnormality, atraumatic Eyes:  EOMI Lungs: clear to auscultation bilaterally Heart: regular rate and rhythm and S1, S2 normal Abdomen: normal findings: bowel sounds normal and soft, non-tender Extremities:  no edema MSK: pain with passive movement of LE's Skin: mobility and turgor normal Neurologic: Moving all 4 extremities spontaneously but does not follow commands  Consultants:    Procedures:    Data Reviewed: I have personally reviewed following labs and imaging studies Results for orders placed or performed during the hospital encounter of 01/13/21 (from the past 24 hour(s))  Glucose, capillary     Status: Abnormal   Collection Time: 01/24/21  4:35 PM  Result Value Ref Range   Glucose-Capillary 198 (H) 70 - 99 mg/dL  Glucose, capillary     Status: Abnormal   Collection Time: 01/24/21  8:35 PM  Result Value Ref Range   Glucose-Capillary 181 (H) 70 - 99 mg/dL  Glucose, capillary     Status: Abnormal   Collection Time: 01/24/21 10:59 PM  Result Value Ref Range   Glucose-Capillary 155 (H) 70 - 99 mg/dL  Blood gas, arterial     Status: Abnormal   Collection Time: 01/24/21 11:19 PM  Result Value Ref Range  Mode ROOM AIR    pH, Arterial 7.409 7.350 - 7.450   pCO2 arterial 36.4 32.0 - 48.0 mmHg   pO2, Arterial 76.1 (L) 83.0 - 108.0 mmHg   Bicarbonate 22.6 20.0 - 28.0 mmol/L   Acid-base deficit 1.1 0.0 - 2.0 mmol/L   O2 Saturation 94.4 %   Patient temperature 98.6    Allens test (pass/fail) POSITIVE (A) PASS  Comprehensive metabolic panel     Status: Abnormal   Collection Time: 01/24/21 11:46 PM  Result Value Ref Range   Sodium 140 135 - 145 mmol/L   Potassium 4.4 3.5 - 5.1 mmol/L   Chloride 106 98 - 111 mmol/L   CO2 25 22 - 32 mmol/L   Glucose, Bld 164 (H) 70 - 99 mg/dL   BUN 26 (H) 8 - 23 mg/dL   Creatinine, Ser 1.70 (H) 0.61 - 1.24 mg/dL   Calcium 10.1 8.9 - 10.3 mg/dL   Total Protein 7.3 6.5 - 8.1 g/dL   Albumin  3.2 (L) 3.5 - 5.0 g/dL   AST 36 15 - 41 U/L   ALT 49 (H) 0 - 44 U/L   Alkaline Phosphatase 70 38 - 126 U/L   Total Bilirubin 1.0 0.3 - 1.2 mg/dL   GFR, Estimated 42 (L) >60 mL/min   Anion gap 9 5 - 15  Ammonia     Status: Abnormal   Collection Time: 01/24/21 11:46 PM  Result Value Ref Range   Ammonia 42 (H) 9 - 35 umol/L  CBC with Differential/Platelet     Status: Abnormal   Collection Time: 01/25/21  5:41 AM  Result Value Ref Range   WBC 4.7 4.0 - 10.5 K/uL   RBC 6.01 (H) 4.22 - 5.81 MIL/uL   Hemoglobin 16.0 13.0 - 17.0 g/dL   HCT 51.5 39.0 - 52.0 %   MCV 85.7 80.0 - 100.0 fL   MCH 26.6 26.0 - 34.0 pg   MCHC 31.1 30.0 - 36.0 g/dL   RDW 15.0 11.5 - 15.5 %   Platelets 257 150 - 400 K/uL   nRBC 0.0 0.0 - 0.2 %   Neutrophils Relative % 71 %   Neutro Abs 3.3 1.7 - 7.7 K/uL   Lymphocytes Relative 16 %   Lymphs Abs 0.8 0.7 - 4.0 K/uL   Monocytes Relative 11 %   Monocytes Absolute 0.5 0.1 - 1.0 K/uL   Eosinophils Relative 2 %   Eosinophils Absolute 0.1 0.0 - 0.5 K/uL   Basophils Relative 0 %   Basophils Absolute 0.0 0.0 - 0.1 K/uL   Immature Granulocytes 0 %   Abs Immature Granulocytes 0.02 0.00 - 0.07 K/uL  Comprehensive metabolic panel     Status: Abnormal   Collection Time: 01/25/21  5:41 AM  Result Value Ref Range   Sodium 142 135 - 145 mmol/L   Potassium 4.7 3.5 - 5.1 mmol/L   Chloride 112 (H) 98 - 111 mmol/L   CO2 23 22 - 32 mmol/L   Glucose, Bld 183 (H) 70 - 99 mg/dL   BUN 27 (H) 8 - 23 mg/dL   Creatinine, Ser 1.73 (H) 0.61 - 1.24 mg/dL   Calcium 10.5 (H) 8.9 - 10.3 mg/dL   Total Protein 7.0 6.5 - 8.1 g/dL   Albumin 3.1 (L) 3.5 - 5.0 g/dL   AST 36 15 - 41 U/L   ALT 50 (H) 0 - 44 U/L   Alkaline Phosphatase 64 38 - 126 U/L   Total Bilirubin 0.8 0.3 - 1.2  mg/dL   GFR, Estimated 41 (L) >60 mL/min   Anion gap 7 5 - 15  Magnesium     Status: None   Collection Time: 01/25/21  5:41 AM  Result Value Ref Range   Magnesium 1.8 1.7 - 2.4 mg/dL  Glucose, capillary      Status: Abnormal   Collection Time: 01/25/21  7:59 AM  Result Value Ref Range   Glucose-Capillary 177 (H) 70 - 99 mg/dL    Recent Results (from the past 240 hour(s))  SARS CORONAVIRUS 2 (TAT 6-24 HRS) Nasopharyngeal Nasopharyngeal Swab     Status: None   Collection Time: 01/16/21  4:23 PM   Specimen: Nasopharyngeal Swab  Result Value Ref Range Status   SARS Coronavirus 2 NEGATIVE NEGATIVE Final    Comment: (NOTE) SARS-CoV-2 target nucleic acids are NOT DETECTED.  The SARS-CoV-2 RNA is generally detectable in upper and lower respiratory specimens during the acute phase of infection. Negative results do not preclude SARS-CoV-2 infection, do not rule out co-infections with other pathogens, and should not be used as the sole basis for treatment or other patient management decisions. Negative results must be combined with clinical observations, patient history, and epidemiological information. The expected result is Negative.  Fact Sheet for Patients: SugarRoll.be  Fact Sheet for Healthcare Providers: https://www.woods-mathews.com/  This test is not yet approved or cleared by the [place] FDA and  has been authorized for detection and/or diagnosis of SARS-CoV-2 by FDA under an Emergency Use Authorization (EUA). This EUA will remain  in effect (meaning this test can be used) for the duration of the COVID-19 declaration under Se ction 564(b)(1) of the Act, 21 U.S.C. section 360bbb-3(b)(1), unless the authorization is terminated or revoked sooner.  Performed at [facility], [place] [address], [place], [place] [address]   Culture, blood (routine x 2)     Status: None   Collection Time: 01/17/21 12:46 PM   Specimen: Right Antecubital; Blood  Result Value Ref Range Status   Specimen Description   Final    RIGHT ANTECUBITAL Performed at [place] [address] [place], [place] [address]    Special Requests    Final    BOTTLES DRAWN AEROBIC ONLY Blood Culture adequate volume Performed at [place] [address], [place], [place] [address]    Culture   Final    NO GROWTH 5 DAYS Performed at [facility], [place] [address], [place], [place] [address]    Report Status 01/22/2021 FINAL  Final  Culture, blood (routine x 2)     Status: None   Collection Time: 01/17/21 12:46 PM   Specimen: BLOOD RIGHT HAND  Result Value Ref Range Status   Specimen Description   Final    BLOOD RIGHT HAND Performed at [place] [address] [place], [place] [address]    Special Requests   Final    BOTTLES DRAWN AEROBIC ONLY Blood Culture adequate volume Performed at [place] [address], [place], [place] [address]    Culture   Final    NO GROWTH 5 DAYS Performed at [facility], [place] [address] [place], [place] [address]    Report Status 01/22/2021 FINAL  Final  Urine Culture     Status: Abnormal   Collection Time: 01/17/21  1:28 PM   Specimen: Urine, Clean Catch  Result Value Ref Range Status   Specimen Description   Final  URINE, CLEAN CATCH Performed at [facility], [place] [address] [place], [place] [address]    Special Requests   Final    NONE Performed at [facility], [place] [address], [place], [place] [address]    Culture (A)  Final    >=100,000 COLONIES/mL ESCHERICHIA COLI >=100,000 COLONIES/mL ENTEROBACTER CLOACAE    Report Status 01/20/2021 FINAL  Final   Organism ID, Bacteria ESCHERICHIA COLI (A)  Final   Organism ID, Bacteria ENTEROBACTER CLOACAE (A)  Final      Susceptibility   Enterobacter cloacae - MIC*    CEFAZOLIN >=64 RESISTANT Resistant     CEFEPIME <=0.12 SENSITIVE Sensitive     CIPROFLOXACIN <=0.25 SENSITIVE Sensitive     GENTAMICIN <=1 SENSITIVE Sensitive     IMIPENEM <=0.25 SENSITIVE Sensitive     NITROFURANTOIN 64 INTERMEDIATE Intermediate      TRIMETH/SULFA <=20 SENSITIVE Sensitive     PIP/TAZO <=4 SENSITIVE Sensitive     * >=100,000 COLONIES/mL ENTEROBACTER CLOACAE   Escherichia coli - MIC*    AMPICILLIN <=2 SENSITIVE Sensitive     CEFAZOLIN <=4 SENSITIVE Sensitive     CEFEPIME <=0.12 SENSITIVE Sensitive     CEFTRIAXONE <=0.25 SENSITIVE Sensitive     CIPROFLOXACIN <=0.25 SENSITIVE Sensitive     GENTAMICIN <=1 SENSITIVE Sensitive     IMIPENEM <=0.25 SENSITIVE Sensitive     NITROFURANTOIN <=16 SENSITIVE Sensitive     TRIMETH/SULFA <=20 SENSITIVE Sensitive     AMPICILLIN/SULBACTAM <=2 SENSITIVE Sensitive     PIP/TAZO <=4 SENSITIVE Sensitive     * >=100,000 COLONIES/mL ESCHERICHIA COLI     Radiology Studies: EEG adult  Result Date: Jan 31, 2021 Lora Havens, MD     31-Jan-2021  7:03 PM Patient Name: BRANNEN KOPPEN MRN: [id number] Epilepsy Attending: Lora Havens Referring Physician/Provider: Dwyane Dee Date: 2021/01/31 Duration: 23.50 mins Patient history: 70yo M with ams. EEG to evaluate for seizure Level of alertness: Asleep, lethargic AEDs during EEG study: None Technical aspects: This EEG study was done with scalp electrodes positioned according to the 10-20 International system of electrode placement. Electrical activity was acquired at a sampling rate of $Remov'500Hz'KOicsT$  and reviewed with a high frequency filter of $RemoveB'70Hz'mCAdPVcg$  and a low frequency filter of $RemoveB'1Hz'FbwrTvjs$ . EEG data were recorded continuously and digitally stored. Description: No posterior dominant rhythm was seen. Sleep was characterized by vertex waves, sleep spindles (12 to 14 Hz), maximal frontocentral region.  EEG showed continuous generalized 3 to 6 Hz theta-delta slowing.  Hyperventilation and photic stimulation were not performed.   ABNORMALITY - Continuous slow, generalized IMPRESSION: This study is suggestive of moderate diffuse encephalopathy, nonspecific etiology. No seizures or epileptiform discharges were seen throughout the recording. Lora Havens   DG Chest Port 1 View   Final Result    MR BRAIN WO CONTRAST  Final Result    CT HEAD WO CONTRAST  Final Result      Scheduled Meds:  amLODipine  10 mg Oral Daily   aspirin  81 mg Oral QHS   atorvastatin  40 mg Oral Daily   chlorhexidine  15 mL Mouth Rinse BID   clopidogrel  75 mg Oral Daily   heparin injection (subcutaneous)  5,000 Units Subcutaneous Q8H   insulin aspart  0-15 Units Subcutaneous TID WC   insulin glargine-yfgn  8 Units Subcutaneous QHS   lactulose  20 g Oral BID   mouth rinse  15 mL Mouth Rinse q12n4p   mycophenolate  360 mg Oral Daily  And   mycophenolate  180 mg Oral Q2200   nortriptyline  100 mg Oral QHS   pantoprazole  40 mg Oral Daily   tacrolimus  5 mg Oral BID   thiamine  100 mg Oral Daily   PRN Meds: acetaminophen **OR** acetaminophen, bisacodyl, metoprolol tartrate Continuous Infusions:  lactated ringers 75 mL/hr at 01/25/21 0513     LOS: 11 days  Time spent: Greater than 50% of the 35 minute visit was spent in counseling/coordination of care for the patient as laid out in the A&P.   Dwyane Dee, MD [facility] 01/25/2021, 1:55 PM

## 2021-01-25 NOTE — Progress Notes (Signed)
   01/24/21 2300  Glasgow Coma Scale  Eye Opening 3  Best Verbal Response (NON-intubated) 1  Best Motor Response 4  Glasgow Coma Scale Score 8  Provider Notification  Provider Name/Title Jeannette Corpus (on call provider)  Date Provider Notified 01/24/21  Time Provider Notified 2320  Notification Type Call  Notification Reason Change in status (Patient very lethargic. Responds to voice and pain, but not following commands.)  Provider response See new orders  Date of Provider Response 01/24/21  Time of Provider Response 2324  Rapid Response Notification  Name of Rapid Response RN Notified Genell, RN  Date Rapid Response Notified 01/24/21  Time Rapid Response Notified 2256

## 2021-01-25 NOTE — Plan of Care (Signed)

## 2021-01-25 NOTE — Progress Notes (Signed)
Rapid Response Event Note   Reason for Call : pt has AMS, lethargic   Initial Focused Assessment: Pt definitely AMS, arouses to painful stimuli but only for a very short time.  VSS see flowsheet.  Pulses intact.  Pupils equal and reactive at a 3.  CBG 155.  Last known well time not known.  Called [facility], NP for orders since pt was admitted for AMS, as well. Breath sounds clear.   Interventions: EKG complete, spoke with [facility], NP see chart for new orders received and initiated.     Plan of Care: Pt to remain in current location and Primary RN to follow-up with [facility], NP with results of labs.   Event Summary:   MD Notified: yes Call Time:2250 Arrival Time:2251 End Time:0010  Dyann Ruddle, RN

## 2021-01-25 NOTE — Progress Notes (Signed)
After on call provider gave new orders for IV fluids and lactulose enema, nurse administered ordered medications. The patient became more alert and was saying some words, or trying to, in response to nurse's questions. Patient became more alert after lactulose enema was given.

## 2021-01-26 LAB — COMPREHENSIVE METABOLIC PANEL
ALT: 41 U/L (ref 0–44)
AST: 30 U/L (ref 15–41)
Albumin: 2.9 g/dL — ABNORMAL LOW (ref 3.5–5.0)
Alkaline Phosphatase: 57 U/L (ref 38–126)
Anion gap: 10 (ref 5–15)
BUN: 30 mg/dL — ABNORMAL HIGH (ref 8–23)
CO2: 20 mmol/L — ABNORMAL LOW (ref 22–32)
Calcium: 9.5 mg/dL (ref 8.9–10.3)
Chloride: 111 mmol/L (ref 98–111)
Creatinine, Ser: 1.75 mg/dL — ABNORMAL HIGH (ref 0.61–1.24)
GFR, Estimated: 41 mL/min — ABNORMAL LOW (ref >60)
Glucose, Bld: 164 mg/dL — ABNORMAL HIGH (ref 70–99)
Potassium: 4.5 mmol/L (ref 3.5–5.1)
Sodium: 141 mmol/L (ref 135–145)
Total Bilirubin: 0.7 mg/dL (ref 0.3–1.2)
Total Protein: 6.1 g/dL — ABNORMAL LOW (ref 6.5–8.1)

## 2021-01-26 LAB — CBC WITH DIFFERENTIAL/PLATELET
Abs Immature Granulocytes: 0.02 10*3/uL (ref 0.00–0.07)
Basophils Absolute: 0 10*3/uL (ref 0.0–0.1)
Basophils Relative: 0 %
Eosinophils Absolute: 0.1 10*3/uL (ref 0.0–0.5)
Eosinophils Relative: 2 %
HCT: 50.4 % (ref 39.0–52.0)
Hemoglobin: 15.3 g/dL (ref 13.0–17.0)
Immature Granulocytes: 1 %
Lymphocytes Relative: 13 %
Lymphs Abs: 0.4 10*3/uL — ABNORMAL LOW (ref 0.7–4.0)
MCH: 26.6 pg (ref 26.0–34.0)
MCHC: 30.4 g/dL (ref 30.0–36.0)
MCV: 87.5 fL (ref 80.0–100.0)
Monocytes Absolute: 0.3 10*3/uL (ref 0.1–1.0)
Monocytes Relative: 11 %
Neutro Abs: 2.2 10*3/uL (ref 1.7–7.7)
Neutrophils Relative %: 73 %
Platelets: 240 10*3/uL (ref 150–400)
RBC: 5.76 MIL/uL (ref 4.22–5.81)
RDW: 15.1 % (ref 11.5–15.5)
WBC: 3 10*3/uL — ABNORMAL LOW (ref 4.0–10.5)
nRBC: 0 % (ref 0.0–0.2)

## 2021-01-26 LAB — GLUCOSE, CAPILLARY
Glucose-Capillary: 156 mg/dL — ABNORMAL HIGH (ref 70–99)
Glucose-Capillary: 158 mg/dL — ABNORMAL HIGH (ref 70–99)
Glucose-Capillary: 166 mg/dL — ABNORMAL HIGH (ref 70–99)
Glucose-Capillary: 170 mg/dL — ABNORMAL HIGH (ref 70–99)
Glucose-Capillary: 188 mg/dL — ABNORMAL HIGH (ref 70–99)
Glucose-Capillary: 195 mg/dL — ABNORMAL HIGH (ref 70–99)

## 2021-01-26 LAB — MAGNESIUM: Magnesium: 1.7 mg/dL (ref 1.7–2.4)

## 2021-01-26 LAB — AMMONIA: Ammonia: 33 umol/L (ref 9–35)

## 2021-01-26 LAB — VITAMIN B1: Vitamin B1 (Thiamine): 104 nmol/L (ref 66.5–200.0)

## 2021-01-26 MED ORDER — SODIUM CHLORIDE 0.9 % IV SOLN: INTRAVENOUS | Status: AC

## 2021-01-26 NOTE — Progress Notes (Signed)
Progress Note    JEFTE CARITHERS   KVQ:[id number]  DOB: 1950/09/05  DOA: 01/13/2021     12  PCP: [name], MD  Initial CC: Altered mental status  Hospital Course: Mr. Voorhies is a 70 yo male with PMH CKD III s/p renal transplant on tacrolimus and mycophenolate, dCHF, CAD, HTN, HLD, OSA, CVA with left-sided weakness, diabetes with nephropathy, prostate cancer who was brought to the ED due to altered mental status.   Patient lives with son at home, EMS was called and he was hypotensive with blood pressure in the 80s, IV fluids were started and he was brought to the ED. In the ED he was noted to be alert awake and oriented blood work showed AKI on CKD, UA negative for nitrates and leukocyte esterase, UDS is positive for benzodiazepine; ammonia 12, CT head no acute finding.   APS became involved due to patient's living conditions at home and clinical status on arrival. His mentation worsened after admission and he underwent further work-up for his encephalopathy.  MRI brain was performed on 01/17/2021 and negative for acute stroke but did show mild chronic small vessel ischemic disease and cerebral atrophy.  Also chronic left cerebellar infarcts. He then developed new fever during hospitalization and repeat UA was obtained.  This showed moderate leukocyte esterase and 21-50 WBCs with many bacteria.  He was started on Zosyn.  Urine cultures grew E. coli and Enterobacter cloacae.  He completed course of Zosyn during hospitalization.  His mentation improved some but did not return back to his normal baseline.  Interval History:  Was actually able to say a few words to me today which is better than the past couple days.  He was able to state his name and follows some commands such as squeezing my fingers with his hands.  This was an improvement but he still has significant functional debility.  He also seems to be in significant pain when his lower extremities are passively moved, however 2 days  ago despite being a total assist he was stood up from the bed with therapy.  This may be stiffness from just being in bed so long during hospitalization.   ROS: Constitutional: negative for chills and fevers, Respiratory: negative for cough and sputum, Cardiovascular: negative for chest pain, and Gastrointestinal: negative  Assessment & Plan: * Acute metabolic encephalopathy - As of 01/22/2021 he is oriented to his name, year, city and son's name who visited, [name]. -Etiology is likely mixed in setting of nutrient deficiencies, resolved infection, dehydration on admission with ketosis, and underlying cerebral atrophy - other notable workup: B12 414, folate 11.3 - will send off B1 level as well (level is 104, mid range so not likely to be Wernicke's) - this current mentation may be patient's new baseline; have communicated this to family - some concern for possible catatonia on 9/1. He had +benzo on UDS on admission and is on consistent valium at home; no valium since admission and patient was noted to be talking normally on admission then had a mentation change; some concern for benzo withdrawal although half life for valium is quite long; no significant improvement with ativan trial and therefore discontinued - EEG checked on 9/2, also negative for seizure activity; shows moderate diffuse encephalopathy - NH3 normal on admission 8/22 and on 8/26; repeated 9/2 and mildly elevated 42 (treated with lactulose enema); will continue on BID lactulose for now and also monitor mentation response  -Given poor improvement with patient unable to  ambulate, will consult palliative care to help initiate some [place] discussions with his son, [name]  Acute renal failure superimposed on stage 3b chronic kidney disease ([place]) - patient has history of CKD3b. Baseline creat ~ 1.6 - 1.7, eGFR 40 - patient presents with increase in creat >0.3 mg/dL above baseline, creat increase >1.5x baseline presumed to have occurred within  past 7 days PTA -Presumed prerenal from dehydration on admission - Creatinine peaked at 3.7 and has returned back to baseline   Acute lower UTI-resolved as of 01/22/2021 - Urine cultures grew E. coli and Enterobacter cloacae - Completed Zosyn course during hospitalization  Thyroid nodule - Last thyroid ultrasound 2017 noted with bilateral nodules with recommendation for 1 year follow-up.  Do not see a repeat ultrasound since then - TSH = 2.179 on 01/17/21 - will have patient follow up outpatient for repeat u/s  Physical deconditioning - Evaluated by PT/OT.  Plan is for discharge to SNF  Anxiety - d/c valium - ativan trial as above (did not respond)  Chronic diastolic CHF (congestive heart failure) (HCC) - No signs or symptoms of exacerbation -Last echo July 2021: EF greater than 35%, grade 1 diastolic dysfunction  DM (diabetes mellitus) type II controlled with renal manifestation (HCC) - Continue SSI and CBG monitoring -A1c 8.2% on 01/14/2021  Renal transplant recipient - Evaluated by nephrology - Tacrolimus level appropriate - Outpatient follow-up with transplant team  GERD (gastroesophageal reflux disease) - Continue Protonix  History of stroke - continue asa and plavix - MRI brain 8/26: negative for acute stroke.  Mild chronic small vessel ischemic disease and cerebral atrophy.  Chronic left cerebellar infarcts.  CAD -s/p DES to marginal vessel at [facility] 2014 - Continue aspirin, Plavix, statin  Gout - no signs of flare  Hypertensive urgency-resolved as of 01/22/2021 - Resolved with treatment   Old records reviewed in assessment of this patient  Antimicrobials: Zosyn 01/17/2021 >> 01/22/2021  DVT prophylaxis: heparin injection 5,000 Units Start: 01/15/21 1400 SCDs Start: 01/14/21 0740   Code Status:   Code Status: Full Code Family Communication: son, [name]   Disposition Plan: Status is: Inpatient  Remains inpatient appropriate because:Unsafe d/c plan and  Inpatient level of care appropriate due to severity of illness  Dispo: The patient is from: Home              Anticipated d/c is to: SNF              Patient currently is not medically stable to d/c.   Difficult to place patient No Risk of unplanned readmission score: Unplanned Admission- Pilot do not use: 24.23   Objective: Blood pressure 124/84, pulse 87, temperature 98.4 F (36.9 C), temperature source Oral, resp. rate 14, height 5' 10.5" (1.791 m), weight 89.5 kg, SpO2 98 %.  Examination: General appearance:  Able to state his name when asked today.  Still staring around the room.  Remains in no distress Head: Normocephalic, without obvious abnormality, atraumatic Eyes:  EOMI Lungs: clear to auscultation bilaterally Heart: regular rate and rhythm and S1, S2 normal Abdomen: normal findings: bowel sounds normal and soft, non-tender Extremities:  no edema MSK: pain with passive movement of LE's Skin: mobility and turgor normal Neurologic: Follows some commands, moves all 4 extremities spontaneously  Consultants:    Procedures:    Data Reviewed: I have personally reviewed following labs and imaging studies Results for orders placed or performed during the hospital encounter of 01/13/21 (from the past 24 hour(s))  Glucose, capillary  Status: Abnormal   Collection Time: 01/25/21  5:07 PM  Result Value Ref Range   Glucose-Capillary 163 (H) 70 - 99 mg/dL  Glucose, capillary     Status: Abnormal   Collection Time: 01/25/21  7:37 PM  Result Value Ref Range   Glucose-Capillary 155 (H) 70 - 99 mg/dL  Glucose, capillary     Status: Abnormal   Collection Time: 01/26/21 12:03 AM  Result Value Ref Range   Glucose-Capillary 195 (H) 70 - 99 mg/dL  Glucose, capillary     Status: Abnormal   Collection Time: 01/26/21  3:56 AM  Result Value Ref Range   Glucose-Capillary 188 (H) 70 - 99 mg/dL  CBC with Differential/Platelet     Status: Abnormal   Collection Time: 01/26/21  5:01 AM   Result Value Ref Range   WBC 3.0 (L) 4.0 - 10.5 K/uL   RBC 5.76 4.22 - 5.81 MIL/uL   Hemoglobin 15.3 13.0 - 17.0 g/dL   HCT 50.4 39.0 - 52.0 %   MCV 87.5 80.0 - 100.0 fL   MCH 26.6 26.0 - 34.0 pg   MCHC 30.4 30.0 - 36.0 g/dL   RDW 15.1 11.5 - 15.5 %   Platelets 240 150 - 400 K/uL   nRBC 0.0 0.0 - 0.2 %   Neutrophils Relative % 73 %   Neutro Abs 2.2 1.7 - 7.7 K/uL   Lymphocytes Relative 13 %   Lymphs Abs 0.4 (L) 0.7 - 4.0 K/uL   Monocytes Relative 11 %   Monocytes Absolute 0.3 0.1 - 1.0 K/uL   Eosinophils Relative 2 %   Eosinophils Absolute 0.1 0.0 - 0.5 K/uL   Basophils Relative 0 %   Basophils Absolute 0.0 0.0 - 0.1 K/uL   Immature Granulocytes 1 %   Abs Immature Granulocytes 0.02 0.00 - 0.07 K/uL  Comprehensive metabolic panel     Status: Abnormal   Collection Time: 01/26/21  5:01 AM  Result Value Ref Range   Sodium 141 135 - 145 mmol/L   Potassium 4.5 3.5 - 5.1 mmol/L   Chloride 111 98 - 111 mmol/L   CO2 20 (L) 22 - 32 mmol/L   Glucose, Bld 164 (H) 70 - 99 mg/dL   BUN 30 (H) 8 - 23 mg/dL   Creatinine, Ser 1.75 (H) 0.61 - 1.24 mg/dL   Calcium 9.5 8.9 - 10.3 mg/dL   Total Protein 6.1 (L) 6.5 - 8.1 g/dL   Albumin 2.9 (L) 3.5 - 5.0 g/dL   AST 30 15 - 41 U/L   ALT 41 0 - 44 U/L   Alkaline Phosphatase 57 38 - 126 U/L   Total Bilirubin 0.7 0.3 - 1.2 mg/dL   GFR, Estimated 41 (L) >60 mL/min   Anion gap 10 5 - 15  Magnesium     Status: None   Collection Time: 01/26/21  5:01 AM  Result Value Ref Range   Magnesium 1.7 1.7 - 2.4 mg/dL  Ammonia     Status: None   Collection Time: 01/26/21  5:01 AM  Result Value Ref Range   Ammonia 33 9 - 35 umol/L  Glucose, capillary     Status: Abnormal   Collection Time: 01/26/21  7:41 AM  Result Value Ref Range   Glucose-Capillary 156 (H) 70 - 99 mg/dL   Comment 1 Notify RN   Glucose, capillary     Status: Abnormal   Collection Time: 01/26/21 11:51 AM  Result Value Ref Range   Glucose-Capillary 158 (H) 70 -  99 mg/dL   Comment 1  Notify RN     Recent Results (from the past 240 hour(s))  SARS CORONAVIRUS 2 (TAT 6-24 HRS) Nasopharyngeal Nasopharyngeal Swab     Status: None   Collection Time: 01/16/21  4:23 PM   Specimen: Nasopharyngeal Swab  Result Value Ref Range Status   SARS Coronavirus 2 NEGATIVE NEGATIVE Final    Comment: (NOTE) SARS-CoV-2 target nucleic acids are NOT DETECTED.  The SARS-CoV-2 RNA is generally detectable in upper and lower respiratory specimens during the acute phase of infection. Negative results do not preclude SARS-CoV-2 infection, do not rule out co-infections with other pathogens, and should not be used as the sole basis for treatment or other patient management decisions. Negative results must be combined with clinical observations, patient history, and epidemiological information. The expected result is Negative.  Fact Sheet for Patients: SugarRoll.be  Fact Sheet for Healthcare Providers: https://www.woods-mathews.com/  This test is not yet approved or cleared by the [place] FDA and  has been authorized for detection and/or diagnosis of SARS-CoV-2 by FDA under an Emergency Use Authorization (EUA). This EUA will remain  in effect (meaning this test can be used) for the duration of the COVID-19 declaration under Se ction 564(b)(1) of the Act, 21 U.S.C. section 360bbb-3(b)(1), unless the authorization is terminated or revoked sooner.  Performed at [facility], [place] [address], [place], [place] [address]   Culture, blood (routine x 2)     Status: None   Collection Time: 01/17/21 12:46 PM   Specimen: Right Antecubital; Blood  Result Value Ref Range Status   Specimen Description   Final    RIGHT ANTECUBITAL Performed at [facility] [address] [place], [place] [address]    Special Requests   Final    BOTTLES DRAWN AEROBIC ONLY Blood Culture adequate volume Performed at [facility] [address], [place], [place] [address]    Culture   Final    NO GROWTH 5 DAYS Performed at [facility], [place] [address], [place], [place] [address]    Report Status 01/22/2021 FINAL  Final  Culture, blood (routine x 2)     Status: None   Collection Time: 01/17/21 12:46 PM   Specimen: BLOOD RIGHT HAND  Result Value Ref Range Status   Specimen Description   Final    BLOOD RIGHT HAND Performed at [facility] [address] [place], [place] [address]    Special Requests   Final    BOTTLES DRAWN AEROBIC ONLY Blood Culture adequate volume Performed at [facility] [address] [place], [place] [address]    Culture   Final    NO GROWTH 5 DAYS Performed at [facility], [place] [address] [place], [place] [address]    Report Status 01/22/2021 FINAL  Final  Urine Culture     Status: Abnormal   Collection Time: 01/17/21  1:28 PM   Specimen: Urine, Clean Catch  Result Value Ref Range Status   Specimen Description   Final    URINE, CLEAN CATCH Performed at [facility], [place] [address] [place], [place] [address]    Special Requests   Final    NONE Performed at [facility], [place] [address], [place], [place] [address]    Culture (A)  Final    >=100,000 COLONIES/mL ESCHERICHIA COLI >=100,000 COLONIES/mL ENTEROBACTER CLOACAE    Report Status 01/20/2021 FINAL  Final  Organism ID, Bacteria ESCHERICHIA COLI (A)  Final   Organism ID, Bacteria ENTEROBACTER CLOACAE (A)  Final      Susceptibility   Enterobacter cloacae - MIC*    CEFAZOLIN >=64 RESISTANT Resistant     CEFEPIME <=0.12 SENSITIVE Sensitive     CIPROFLOXACIN <=0.25 SENSITIVE Sensitive     GENTAMICIN <=1 SENSITIVE Sensitive     IMIPENEM <=0.25 SENSITIVE Sensitive     NITROFURANTOIN 64 INTERMEDIATE Intermediate     TRIMETH/SULFA <=20 SENSITIVE Sensitive     PIP/TAZO <=4 SENSITIVE Sensitive     * >=100,000 COLONIES/mL  ENTEROBACTER CLOACAE   Escherichia coli - MIC*    AMPICILLIN <=2 SENSITIVE Sensitive     CEFAZOLIN <=4 SENSITIVE Sensitive     CEFEPIME <=0.12 SENSITIVE Sensitive     CEFTRIAXONE <=0.25 SENSITIVE Sensitive     CIPROFLOXACIN <=0.25 SENSITIVE Sensitive     GENTAMICIN <=1 SENSITIVE Sensitive     IMIPENEM <=0.25 SENSITIVE Sensitive     NITROFURANTOIN <=16 SENSITIVE Sensitive     TRIMETH/SULFA <=20 SENSITIVE Sensitive     AMPICILLIN/SULBACTAM <=2 SENSITIVE Sensitive     PIP/TAZO <=4 SENSITIVE Sensitive     * >=100,000 COLONIES/mL ESCHERICHIA COLI     Radiology Studies: EEG adult  Result Date: 02/23/2021 Lora Havens, MD     02/23/2021  7:03 PM Patient Name: CASHMERE HARMES MRN: [id number] Epilepsy Attending: Lora Havens Referring Physician/Provider: Dwyane Dee Date: 02/23/2021 Duration: 23.50 mins Patient history: 70yo M with ams. EEG to evaluate for seizure Level of alertness: Asleep, lethargic AEDs during EEG study: None Technical aspects: This EEG study was done with scalp electrodes positioned according to the 10-20 International system of electrode placement. Electrical activity was acquired at a sampling rate of $Remov'500Hz'FrPMIX$  and reviewed with a high frequency filter of $RemoveB'70Hz'okGWsSTl$  and a low frequency filter of $RemoveB'1Hz'ZCXJfgbM$ . EEG data were recorded continuously and digitally stored. Description: No posterior dominant rhythm was seen. Sleep was characterized by vertex waves, sleep spindles (12 to 14 Hz), maximal frontocentral region.  EEG showed continuous generalized 3 to 6 Hz theta-delta slowing.  Hyperventilation and photic stimulation were not performed.   ABNORMALITY - Continuous slow, generalized IMPRESSION: This study is suggestive of moderate diffuse encephalopathy, nonspecific etiology. No seizures or epileptiform discharges were seen throughout the recording. Lora Havens   DG Chest Port 1 View  Final Result    MR BRAIN WO CONTRAST  Final Result    CT HEAD WO CONTRAST  Final Result       Scheduled Meds:  amLODipine  10 mg Oral Daily   aspirin  81 mg Oral QHS   atorvastatin  40 mg Oral Daily   chlorhexidine  15 mL Mouth Rinse BID   clopidogrel  75 mg Oral Daily   heparin injection (subcutaneous)  5,000 Units Subcutaneous Q8H   insulin aspart  0-15 Units Subcutaneous TID WC   insulin glargine-yfgn  8 Units Subcutaneous QHS   lactulose  20 g Oral BID   mouth rinse  15 mL Mouth Rinse q12n4p   mycophenolate  360 mg Oral Daily   And   mycophenolate  180 mg Oral Q2200   nortriptyline  100 mg Oral QHS   pantoprazole  40 mg Oral Daily   tacrolimus  5 mg Oral BID   thiamine  100 mg Oral Daily   PRN Meds: acetaminophen **OR** acetaminophen, bisacodyl, metoprolol tartrate Continuous Infusions:  sodium chloride 75 mL/hr at 01/26/21 0934   lactated ringers Stopped (01/26/21 0934)  LOS: 12 days  Time spent: Greater than 50% of the 35 minute visit was spent in counseling/coordination of care for the patient as laid out in the A&P.   Dwyane Dee, MD [facility] 01/26/2021, 1:53 PM

## 2021-01-27 DIAGNOSIS — N390 Urinary tract infection, site not specified: Secondary | ICD-10-CM

## 2021-01-27 DIAGNOSIS — Z940 Kidney transplant status: Secondary | ICD-10-CM

## 2021-01-27 DIAGNOSIS — N179 Acute kidney failure, unspecified: Secondary | ICD-10-CM

## 2021-01-27 DIAGNOSIS — I5032 Chronic diastolic (congestive) heart failure: Secondary | ICD-10-CM

## 2021-01-27 DIAGNOSIS — Z515 Encounter for palliative care: Secondary | ICD-10-CM

## 2021-01-27 DIAGNOSIS — I2510 Atherosclerotic heart disease of native coronary artery without angina pectoris: Secondary | ICD-10-CM

## 2021-01-27 DIAGNOSIS — Z794 Long term (current) use of insulin: Secondary | ICD-10-CM

## 2021-01-27 DIAGNOSIS — R5381 Other malaise: Secondary | ICD-10-CM

## 2021-01-27 DIAGNOSIS — N1832 Chronic kidney disease, stage 3b: Secondary | ICD-10-CM

## 2021-01-27 DIAGNOSIS — R4182 Altered mental status, unspecified: Secondary | ICD-10-CM

## 2021-01-27 DIAGNOSIS — E1122 Type 2 diabetes mellitus with diabetic chronic kidney disease: Secondary | ICD-10-CM

## 2021-01-27 DIAGNOSIS — E860 Dehydration: Secondary | ICD-10-CM

## 2021-01-27 DIAGNOSIS — N183 Chronic kidney disease, stage 3 unspecified: Secondary | ICD-10-CM

## 2021-01-27 DIAGNOSIS — Z7189 Other specified counseling: Secondary | ICD-10-CM

## 2021-01-27 DIAGNOSIS — R410 Disorientation, unspecified: Secondary | ICD-10-CM

## 2021-01-27 LAB — COMPREHENSIVE METABOLIC PANEL
ALT: 33 U/L (ref 0–44)
AST: 19 U/L (ref 15–41)
Albumin: 3 g/dL — ABNORMAL LOW (ref 3.5–5.0)
Alkaline Phosphatase: 60 U/L (ref 38–126)
Anion gap: 7 (ref 5–15)
BUN: 27 mg/dL — ABNORMAL HIGH (ref 8–23)
CO2: 21 mmol/L — ABNORMAL LOW (ref 22–32)
Calcium: 10 mg/dL (ref 8.9–10.3)
Chloride: 116 mmol/L — ABNORMAL HIGH (ref 98–111)
Creatinine, Ser: 1.52 mg/dL — ABNORMAL HIGH (ref 0.61–1.24)
GFR, Estimated: 48 mL/min — ABNORMAL LOW (ref >60)
Glucose, Bld: 168 mg/dL — ABNORMAL HIGH (ref 70–99)
Potassium: 4.4 mmol/L (ref 3.5–5.1)
Sodium: 144 mmol/L (ref 135–145)
Total Bilirubin: 0.8 mg/dL (ref 0.3–1.2)
Total Protein: 6.7 g/dL (ref 6.5–8.1)

## 2021-01-27 LAB — CBC WITH DIFFERENTIAL/PLATELET
Abs Immature Granulocytes: 0.01 10*3/uL (ref 0.00–0.07)
Basophils Absolute: 0 10*3/uL (ref 0.0–0.1)
Basophils Relative: 0 %
Eosinophils Absolute: 0.1 10*3/uL (ref 0.0–0.5)
Eosinophils Relative: 4 %
HCT: 52.1 % — ABNORMAL HIGH (ref 39.0–52.0)
Hemoglobin: 15.8 g/dL (ref 13.0–17.0)
Immature Granulocytes: 0 %
Lymphocytes Relative: 13 %
Lymphs Abs: 0.4 10*3/uL — ABNORMAL LOW (ref 0.7–4.0)
MCH: 26.6 pg (ref 26.0–34.0)
MCHC: 30.3 g/dL (ref 30.0–36.0)
MCV: 87.7 fL (ref 80.0–100.0)
Monocytes Absolute: 0.3 10*3/uL (ref 0.1–1.0)
Monocytes Relative: 12 %
Neutro Abs: 2 10*3/uL (ref 1.7–7.7)
Neutrophils Relative %: 71 %
Platelets: 208 10*3/uL (ref 150–400)
RBC: 5.94 MIL/uL — ABNORMAL HIGH (ref 4.22–5.81)
RDW: 15.2 % (ref 11.5–15.5)
WBC: 2.9 10*3/uL — ABNORMAL LOW (ref 4.0–10.5)
nRBC: 0 % (ref 0.0–0.2)

## 2021-01-27 LAB — GLUCOSE, CAPILLARY
Glucose-Capillary: 143 mg/dL — ABNORMAL HIGH (ref 70–99)
Glucose-Capillary: 180 mg/dL — ABNORMAL HIGH (ref 70–99)
Glucose-Capillary: 187 mg/dL — ABNORMAL HIGH (ref 70–99)
Glucose-Capillary: 203 mg/dL — ABNORMAL HIGH (ref 70–99)
Glucose-Capillary: 210 mg/dL — ABNORMAL HIGH (ref 70–99)
Glucose-Capillary: 231 mg/dL — ABNORMAL HIGH (ref 70–99)
Glucose-Capillary: 234 mg/dL — ABNORMAL HIGH (ref 70–99)

## 2021-01-27 LAB — MAGNESIUM: Magnesium: 1.8 mg/dL (ref 1.7–2.4)

## 2021-01-27 NOTE — Progress Notes (Signed)
Progress Note    [name]   UKG:[id number]  DOB: [birth year]  DOA: 01/13/2021     13  PCP: [name], MD  Initial CC: Altered mental status  Hospital Course: [name] is a 70 yo male with PMH CKD III s/p renal transplant on tacrolimus and mycophenolate, dCHF, CAD, HTN, HLD, OSA, CVA with left-sided weakness, diabetes with nephropathy, prostate cancer who was brought to the ED due to altered mental status.   Patient lives with son at home, EMS was called and he was hypotensive with blood pressure in the 80s, IV fluids were started and he was brought to the ED. In the ED he was noted to be alert awake and oriented blood work showed AKI on CKD, UA negative for nitrates and leukocyte esterase, UDS is positive for benzodiazepine; ammonia 12, CT head no acute finding.   APS became involved due to patient's living conditions at home and clinical status on arrival. His mentation worsened after admission and he underwent further work-up for his encephalopathy.  MRI brain was performed on 01/17/2021 and negative for acute stroke but did show mild chronic small vessel ischemic disease and cerebral atrophy.  Also chronic left cerebellar infarcts. He then developed new fever during hospitalization and repeat UA was obtained.  This showed moderate leukocyte esterase and 21-50 WBCs with many bacteria.  He was started on Zosyn.  Urine cultures grew E. coli and Enterobacter cloacae.  He completed course of Zosyn during hospitalization.  His mentation improved some but did not return back to his normal baseline.  Interval History:  No events overnight.  He muttered a couple words when I walked in the room this morning but then could not engage at all for the conversation. He has been eating about 25% of his meals and requires assistance with feeding. Still could not elicit any commands during exam today.  He continues to stare around the room and not really engage at all.  ROS: Constitutional:  negative for chills and fevers, Respiratory: negative for cough and sputum, Cardiovascular: negative for chest pain, and Gastrointestinal: negative  Assessment & Plan: * Acute metabolic encephalopathy - As of 01/22/2021 he is oriented to his name, year, city and son's name who visited, [name]. -Etiology is likely mixed in setting of nutrient deficiencies, resolved infection, dehydration on admission with ketosis, and underlying cerebral atrophy - other notable workup: B12 414, folate 11.3 - will send off B1 level as well (level is 104, mid range so not likely to be Wernicke's) - this current mentation may be patient's new baseline; have communicated this to family - some concern for possible catatonia on 9/1. He had +benzo on UDS on admission and is on consistent valium at home; no valium since admission and patient was noted to be talking normally on admission then had a mentation change; some concern for benzo withdrawal although half life for valium is quite long; no significant improvement with ativan trial and therefore discontinued - EEG checked on 9/2, also negative for seizure activity; shows moderate diffuse encephalopathy - NH3 normal on admission 8/22 and on 8/26; repeated 9/2 and mildly elevated 42 (treated with lactulose enema); will continue on BID lactulose for now and also monitor mentation response  -Given poor improvement with patient unable to ambulate, will consult palliative care to help initiate some [place] discussions with his son, [name]  Acute renal failure superimposed on stage 3b chronic kidney disease ([place]) - patient has history of CKD3b. Baseline creat ~  1.6 - 1.7, eGFR 40 - patient presents with increase in creat >0.3 mg/dL above baseline, creat increase >1.5x baseline presumed to have occurred within past 7 days PTA -Presumed prerenal from dehydration on admission - Creatinine peaked at 3.7 and has returned back to baseline   Acute lower UTI-resolved as of 01/22/2021 -  Urine cultures grew E. coli and Enterobacter cloacae - Completed Zosyn course during hospitalization  Thyroid nodule - Last thyroid ultrasound 2017 noted with bilateral nodules with recommendation for 1 year follow-up.  Do not see a repeat ultrasound since then - TSH = 2.179 on 01/17/21 - will have patient follow up outpatient for repeat u/s  Physical deconditioning - Evaluated by PT/OT.  Plan is for discharge to SNF  Anxiety - d/c valium - ativan trial as above (did not respond)  Chronic diastolic CHF (congestive heart failure) (HCC) - No signs or symptoms of exacerbation -Last echo July 2021: EF greater than 48%, grade 1 diastolic dysfunction  DM (diabetes mellitus) type II controlled with renal manifestation (HCC) - Continue SSI and CBG monitoring -A1c 8.2% on 01/14/2021  Renal transplant recipient - Evaluated by nephrology - Tacrolimus level appropriate - Outpatient follow-up with transplant team  GERD (gastroesophageal reflux disease) - Continue Protonix  History of stroke - continue asa and plavix - MRI brain 8/26: negative for acute stroke.  Mild chronic small vessel ischemic disease and cerebral atrophy.  Chronic left cerebellar infarcts.  CAD -s/p DES to marginal vessel at [facility] 2014 - Continue aspirin, Plavix, statin  Gout - no signs of flare  Hypertensive urgency-resolved as of 01/22/2021 - Resolved with treatment   Old records reviewed in assessment of this patient  Antimicrobials: Zosyn 01/17/2021 >> 01/22/2021  DVT prophylaxis: heparin injection 5,000 Units Start: 01/15/21 1400 SCDs Start: 01/14/21 0740   Code Status:   Code Status: Full Code Family Communication: son, [name]   Disposition Plan: Status is: Inpatient  Remains inpatient appropriate because:Unsafe d/c plan and Inpatient level of care appropriate due to severity of illness  Dispo: The patient is from: Home              Anticipated d/c is to: SNF              Patient currently is  not medically stable to d/c.   Difficult to place patient No Risk of unplanned readmission score: Unplanned Admission- Pilot do not use: 24.55   Objective: Blood pressure (!) 142/88, pulse 81, temperature 97.8 F (36.6 C), resp. rate 16, height 5' 10.5" (1.791 m), weight 89.6 kg, SpO2 100 %.  Examination: General appearance:  No distress.  Staring around the room.  Does not speak or follow commands. Head: Normocephalic, without obvious abnormality, atraumatic Eyes:  EOMI Lungs: clear to auscultation bilaterally Heart: regular rate and rhythm and S1, S2 normal Abdomen: normal findings: bowel sounds normal and soft, non-tender Extremities:  no edema MSK: pain with passive movement of LE's Skin: mobility and turgor normal Neurologic: Follows some commands, moves all 4 extremities spontaneously  Consultants:  Palliative care  Procedures:    Data Reviewed: I have personally reviewed following labs and imaging studies Results for orders placed or performed during the hospital encounter of 01/13/21 (from the past 24 hour(s))  Glucose, capillary     Status: Abnormal   Collection Time: 01/26/21  4:34 PM  Result Value Ref Range   Glucose-Capillary 170 (H) 70 - 99 mg/dL  Glucose, capillary     Status: Abnormal   Collection Time: 01/26/21  8:08 PM  Result Value Ref Range   Glucose-Capillary 166 (H) 70 - 99 mg/dL  Glucose, capillary     Status: Abnormal   Collection Time: 01/27/21 12:19 AM  Result Value Ref Range   Glucose-Capillary 180 (H) 70 - 99 mg/dL  CBC with Differential/Platelet     Status: Abnormal   Collection Time: 01/27/21  4:23 AM  Result Value Ref Range   WBC 2.9 (L) 4.0 - 10.5 K/uL   RBC 5.94 (H) 4.22 - 5.81 MIL/uL   Hemoglobin 15.8 13.0 - 17.0 g/dL   HCT 52.1 (H) 39.0 - 52.0 %   MCV 87.7 80.0 - 100.0 fL   MCH 26.6 26.0 - 34.0 pg   MCHC 30.3 30.0 - 36.0 g/dL   RDW 15.2 11.5 - 15.5 %   Platelets 208 150 - 400 K/uL   nRBC 0.0 0.0 - 0.2 %   Neutrophils Relative % 71  %   Neutro Abs 2.0 1.7 - 7.7 K/uL   Lymphocytes Relative 13 %   Lymphs Abs 0.4 (L) 0.7 - 4.0 K/uL   Monocytes Relative 12 %   Monocytes Absolute 0.3 0.1 - 1.0 K/uL   Eosinophils Relative 4 %   Eosinophils Absolute 0.1 0.0 - 0.5 K/uL   Basophils Relative 0 %   Basophils Absolute 0.0 0.0 - 0.1 K/uL   Immature Granulocytes 0 %   Abs Immature Granulocytes 0.01 0.00 - 0.07 K/uL  Comprehensive metabolic panel     Status: Abnormal   Collection Time: 01/27/21  4:23 AM  Result Value Ref Range   Sodium 144 135 - 145 mmol/L   Potassium 4.4 3.5 - 5.1 mmol/L   Chloride 116 (H) 98 - 111 mmol/L   CO2 21 (L) 22 - 32 mmol/L   Glucose, Bld 168 (H) 70 - 99 mg/dL   BUN 27 (H) 8 - 23 mg/dL   Creatinine, Ser 1.52 (H) 0.61 - 1.24 mg/dL   Calcium 10.0 8.9 - 10.3 mg/dL   Total Protein 6.7 6.5 - 8.1 g/dL   Albumin 3.0 (L) 3.5 - 5.0 g/dL   AST 19 15 - 41 U/L   ALT 33 0 - 44 U/L   Alkaline Phosphatase 60 38 - 126 U/L   Total Bilirubin 0.8 0.3 - 1.2 mg/dL   GFR, Estimated 48 (L) >60 mL/min   Anion gap 7 5 - 15  Magnesium     Status: None   Collection Time: 01/27/21  4:23 AM  Result Value Ref Range   Magnesium 1.8 1.7 - 2.4 mg/dL  Glucose, capillary     Status: Abnormal   Collection Time: 01/27/21  7:11 AM  Result Value Ref Range   Glucose-Capillary 143 (H) 70 - 99 mg/dL  Glucose, capillary     Status: Abnormal   Collection Time: 01/27/21 11:13 AM  Result Value Ref Range   Glucose-Capillary 187 (H) 70 - 99 mg/dL    Recent Results (from the past 240 hour(s))  Culture, blood (routine x 2)     Status: None   Collection Time: 01/17/21 12:46 PM   Specimen: Right Antecubital; Blood  Result Value Ref Range Status   Specimen Description   Final    RIGHT ANTECUBITAL Performed at [facility], [place] [address] [place], [place] [address]    Special Requests   Final    BOTTLES DRAWN AEROBIC ONLY Blood Culture adequate volume Performed at [place] [address] [place], [place] [address]  Culture   Final    NO GROWTH 5 DAYS Performed at [facility], [place] [address], [place], [place] [address]    Report Status 01/22/2021 FINAL  Final  Culture, blood (routine x 2)     Status: None   Collection Time: 01/17/21 12:46 PM   Specimen: BLOOD RIGHT HAND  Result Value Ref Range Status   Specimen Description   Final    BLOOD RIGHT HAND Performed at [place] [address], [place], [place] [address]    Special Requests   Final    BOTTLES DRAWN AEROBIC ONLY Blood Culture adequate volume Performed at [place] [address], [place], [place] [address]    Culture   Final    NO GROWTH 5 DAYS Performed at [facility], [place] [address] [place], [place] [address]    Report Status 01/22/2021 FINAL  Final  Urine Culture     Status: Abnormal   Collection Time: 01/17/21  1:28 PM   Specimen: Urine, Clean Catch  Result Value Ref Range Status   Specimen Description   Final    URINE, CLEAN CATCH Performed at [facility], [place] [address], [place], [place] [address]    Special Requests   Final    NONE Performed at [facility], [place] [address], [place],  [address]    Culture (A)  Final    >=100,000 COLONIES/mL ESCHERICHIA COLI >=100,000 COLONIES/mL ENTEROBACTER CLOACAE    Report Status 01/20/2021 FINAL  Final   Organism ID, Bacteria ESCHERICHIA COLI (A)  Final   Organism ID, Bacteria ENTEROBACTER CLOACAE (A)  Final      Susceptibility   Enterobacter cloacae - MIC*    CEFAZOLIN >=64 RESISTANT Resistant     CEFEPIME <=0.12 SENSITIVE Sensitive     CIPROFLOXACIN <=0.25 SENSITIVE Sensitive     GENTAMICIN <=1 SENSITIVE Sensitive     IMIPENEM <=0.25 SENSITIVE Sensitive     NITROFURANTOIN 64 INTERMEDIATE Intermediate     TRIMETH/SULFA <=20 SENSITIVE Sensitive     PIP/TAZO <=4 SENSITIVE Sensitive     * >=100,000 COLONIES/mL ENTEROBACTER CLOACAE    Escherichia coli - MIC*    AMPICILLIN <=2 SENSITIVE Sensitive     CEFAZOLIN <=4 SENSITIVE Sensitive     CEFEPIME <=0.12 SENSITIVE Sensitive     CEFTRIAXONE <=0.25 SENSITIVE Sensitive     CIPROFLOXACIN <=0.25 SENSITIVE Sensitive     GENTAMICIN <=1 SENSITIVE Sensitive     IMIPENEM <=0.25 SENSITIVE Sensitive     NITROFURANTOIN <=16 SENSITIVE Sensitive     TRIMETH/SULFA <=20 SENSITIVE Sensitive     AMPICILLIN/SULBACTAM <=2 SENSITIVE Sensitive     PIP/TAZO <=4 SENSITIVE Sensitive     * >=100,000 COLONIES/mL ESCHERICHIA COLI     Radiology Studies: No results found. DG Chest Port 1 View  Final Result    MR BRAIN WO CONTRAST  Final Result    CT HEAD WO CONTRAST  Final Result      Scheduled Meds:  amLODipine  10 mg Oral Daily   aspirin  81 mg Oral QHS   atorvastatin  40 mg Oral Daily   chlorhexidine  15 mL Mouth Rinse BID   clopidogrel  75 mg Oral Daily   heparin injection (subcutaneous)  5,000 Units Subcutaneous Q8H   insulin aspart  0-15 Units Subcutaneous TID WC   insulin glargine-yfgn  8 Units Subcutaneous QHS   lactulose  20 g Oral BID   mouth rinse  15 mL  Mouth Rinse q12n4p   mycophenolate  360 mg Oral Daily   And   mycophenolate  180 mg Oral Q2200   nortriptyline  100 mg Oral QHS   pantoprazole  40 mg Oral Daily   tacrolimus  5 mg Oral BID   thiamine  100 mg Oral Daily   PRN Meds: acetaminophen **OR** acetaminophen, bisacodyl, metoprolol tartrate Continuous Infusions:  sodium chloride 75 mL/hr at 01/27/21 1109   lactated ringers Stopped (01/26/21 0934)     LOS: 13 days  Time spent: Greater than 50% of the 35 minute visit was spent in counseling/coordination of care for the patient as laid out in the A&P.   Dwyane Dee, MD [facility] 01/27/2021, 12:43 PM

## 2021-01-27 NOTE — Consult Note (Addendum)
Palliative Care Consult Note                                  Date: 01/27/2021   Patient Name: [name]  DOB: 28-Aug-1950  MRN: [id number]  Age / Sex: 70 y.o., male  PCP: [name], MD Referring Physician: Dwyane Dee, MD  Reason for Consultation: Establishing goals of care  HPI/Patient Profile: 70 y.o. male  with past medical history of CKD III s/p renal transplant on tacrolimus and mycophenolate, dCHF, CAD, HTN, HLD, OSA, CVA with left-sided weakness, diabetes with nephropathy, prostate cancer admitted on 01/13/2021 with AMS, acute metabolic encephalopathy, AKI on CKD, UTI.   APS involved because of patient's living conditions. He lives with his son. His other son [name]) is primary contact. Initially on admission patient was AAO. However, since then his mental status has significantly declined. Extensive work-up with no identified etiology. Concern that this state of AMS and physical deconditioning may become his new baseline. Current plan is to d/c to SNF at [facility].  PMT was consulted for goals of care.  Past Medical History:  Diagnosis Date  . Allergy   . Angina   . Arthritis   . Backache, unspecified   . CHF (congestive heart failure) ([place])   . Chills   . Chronic kidney disease, stage III (moderate) (HCC)    HD T- TH-SAT  . Complication of anesthesia    01/2011 could not eat,, hospt x2, was placed on hd and cleared up  . Coronary atherosclerosis of native coronary artery   . Diabetes mellitus 2004  . Dizziness   . Dysphagia, unspecified(787.20)   . Fall at home 11/2019  . Gastroparesis   . Headache(784.0)   . Hemorrhage of rectum and anus   . Hyperlipidemia   . Hypertrophy of prostate with urinary obstruction and other lower urinary tract symptoms (LUTS)   . INTERNAL HEMORRHOIDS 10/16/2008   Qualifier: Diagnosis of  By: Nolon Rod CMA (AAMA), Robin    . Internal hemorrhoids without mention of complication    . Myocardial infarction ([place]) 2002  . Nausea alone   . Obesity   . Other dyspnea and respiratory abnormality   . Other malaise and fatigue   . Personal history of unspecified circulatory disease   . Posttraumatic stress disorder   . Prostate cancer ([place])   . Sleep apnea    USES CPAP   . Stroke [facility])    2007  . Unspecified essential hypertension    hx htn     Subjective:   This NP [name] reviewed medical records, received report from team, assessed the patient and then meet at the patient's bedside to discuss diagnosis, prognosis, GOC, EOL wishes disposition and options.  I met with the patient at the bedside who seems to have significant AMS today. He does turn his head toward my voice, and follows simple commands (ie- "hand squeeze") albeit with significant delay. Unable to tell me year, place. Denies overt pain or N/V when asked multiple times.  I contacted his son [name] to discuss the patient's clinical situation and elicit some goals of care.   Concept of Palliative Care was introduced as specialized medical care for people and their families living with serious illness.  If focuses on providing relief from the symptoms and stress of a serious illness.  The goal is to improve quality of life for both the patient  and the family. Values and goals of care important to patient and family were attempted to be elicited.  Created space and opportunity for patient  and family to explore thoughts and feelings regarding current medical situation   Natural trajectory and current clinical status were discussed. Questions and concerns addressed. Patient  encouraged to call with questions or concerns.    Patient/Family Understanding of Illness: The patient is not able to communicate his understanding of his illness. I spoke with his son [name] who states his dad has been living with two of his siblings [name] and another unnamed brother). Tenisha called EMS and upon hospital arrival  he was dehydrated and malnourished (has lost significant weight). He was doing better with initial treatment, but then had a change in his mental status. He thinks it's because of his ammonia because he seems to be doing better ater lactulose. Today he saw his dad and he feels he's overall more communicative, but not near his known baseline.   At baseline the patient has had mobility issues, was using a cane a few onths ago and able to ambulate to his office and to the kitchen; however, more recently not able to adequately care for himself, which is why he was living with two of his children. However, his mental status has been sharp at baseline (still doing taxes, despite family efforts to get him to slow down and stop given his chronic worsening health). There is also a question of possible recent fall this past winter.  He admits his dad has had several health challenges, including MI x 3, CVA, prostate CA (s/p treatment and remission vs. Cure) and kidney failure requiring HD and eventual kidney transplant.  Life Review: The patient is a Programme researcher, broadcasting/film/video and was in the [employer] 10-15 years. He has multiple injuries/debilities due to his service (ie- neuropathy due to laying in "a ditch" in [place] for 3 days). 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  Patient Values: Independence, self-sufficiency  Today's Discussion: I spoke with [name] today about the disease trajectory, goals of care, and the difference between aggressive measures versus supportive care. I introduced the concept around multiple "illness hits" that can result in overall decline. Discussed hospitalist sentiments that he may not have a return to his mental status baseline. Offered support and encouragement. [name] states the plan right now is for discharge to a SNF facility and he is hopefull that some of his cognition may return as well as his functional status. However, he agrees  with the idea to hope/prey for the best, but plan for if things do not go how we hope.  He states his father has 4 children, although only 3 are biological or legally adopted: himself, an older sister [name], and a younger sister [name] (who the patient was living with). [name] has tried to get the patient to complete HCPOA paperwork, but he has not as of yet. He was married, but divorced last year. Legally, medical decisions would be the majority of the 3 legal children [name], [name], and [place]). [name] also shares that in the ED the patient stated he didn't want [name] (whom he was living with) to know about his status/care and asked a password be applied to his record. Also, [name] shares that an APS case has been opened. When [name] tried to ask the patient what happened in the home, the patient "shut down" and wouldn't talk and he feels this may be  a negative trigger.  We discussed Code Status and [name] shares that he and his sister [name] agree the patient wouldn't want aggressive intervention in the event of a cardiac arrest (and wouldn't want to live by artificial means). He shares that his dad refused dialysis until he was near death. He states [name] disagrees, but there seems to be a majority in agreement with DNR. He has agreed for me to enter DNR order, with the understanding that they can change his goals at any time.  Very limited ROS due to AMS Review of Systems  Constitutional:        Denies pain in general  Gastrointestinal:  Negative for nausea.       Denies need for bowel movement   Objective:   Primary Diagnoses: Present on Admission: . Acute metabolic encephalopathy . Acute renal failure superimposed on stage 3b chronic kidney disease ([place]) . (Resolved) Hypertensive urgency . DM (diabetes mellitus) type II controlled with renal manifestation ([place]) . CAD -s/p DES to marginal vessel at [facility] . GERD (gastroesophageal reflux disease) . Gout   Physical Exam Vitals and  nursing note reviewed.  Constitutional:      General: He is not in acute distress.    Appearance: He is obese.  HENT:     Head: Normocephalic and atraumatic.  Cardiovascular:     Rate and Rhythm: Normal rate and regular rhythm.     Heart sounds: No murmur heard. Pulmonary:     Effort: Pulmonary effort is normal. No respiratory distress.     Breath sounds: Normal breath sounds. No wheezing or rhonchi.  Abdominal:     General: Abdomen is flat.     Palpations: Abdomen is soft.     Tenderness: There is abdominal tenderness (apparent mild TTP with noted wincing).  Skin:    General: Skin is warm and dry.  Neurological:     Mental Status: He is alert. He is disoriented.    Vital Signs:  BP (!) 129/92 (BP Location: Right Arm)   Pulse 87   Temp (!) 97.5 F (36.4 C) (Oral)   Resp 18   Ht 5' 10.5" (1.791 m)   Wt 89.6 kg   SpO2 96%   BMI 27.94 kg/m   Palliative Assessment/Data: 40%    Advanced Care Planning:   Primary Decision Maker: NEXT OF KIN  Code Status/Advance Care Planning: DNR  A discussion was had today regarding advanced directives. Concepts specific to code status, artifical feeding and hydration, continued IV antibiotics and rehospitalization was had.  The difference between a aggressive medical intervention path and a palliative comfort care path for this patient at this time was had.  Decisions/Changes to ACP: Change to DNR Further discussions to follow  Assessment & Plan:   Impression: 70 year old male brought in from "home" (living with daughter and son) with dehydration, malnutrition, AKI on CKD s/p renal transplant. Patient with multiple chornic illnesses, now with AMS likely multifactorial. Overall mental status has improved, but not at baseline (and question of whether it could reasonably be expected to return to baseline). Plan for SNF discharge. We were consulted to Laurie discussion. Difficult family situation, as outlined above. Apparent 3 children  with legal standing to make decisions with two in favor of DNR.  SUMMARY OF RECOMMENDATIONS   Change to DNR Continue to treat the treatable Further GOC discussions if patient remains IP tomorrow Will try and arrange a family meeting to discuss MOST form PMT to continue to follow  Symptom Management:  Per primary team Palliative medicine is available to assist as needed  Prognosis:  Unable to determine  Discharge Planning:  To Be Determined   Discussed with: Patient's son [name], Dr. Sabino Gasser, nursing staff  ADDENDUM: Spoke with [name] (son) and have scheduled a family meeting tomorrow at 2:00 pm with he and his older sister [name] (who will either come in person from [place] or participate over the phone).  Thank you for allowing [place] to participate in the care of MEHKAI GALLO PMT will continue to support holistically.  Time Total: 90 min  Greater than 50%  of this time was spent counseling and coordinating care related to the above assessment and plan.  Signed by: Ronald Field, NP Palliative Medicine Team  Team Phone # [phone] (Nights/Weekends)  01/27/2021, 8:21 AM

## 2021-01-27 NOTE — Progress Notes (Signed)
Physical Therapy Treatment Patient Details Name: JAHLON ODOR MRN: [id number] DOB: March 28, 1951 Today's Date: 01/27/2021    History of Present Illness Patient is a 70 yo male admitted with encephalopathy, AKI, hypertensive urgency. Hx of CHF, frequent falls, chronic L sided weakness, diabetic neuropathy, prostate ca, MMI, CKD, renal transplant, CVA Left Hemi    PT Comments    General Comments: Pt awake but with poor eye contact and brief moments of grimmacing with mobility.  Remained Non Verbal and catatonic throughout session.  Only a slight improvement from last session. Assisted to EOB was difficult. General bed mobility comments: total assist x 2 to transfer to side of bed. Exhibiting posterior initial lean and needing assistance to sit up.  Pt offered 0% assist but did static sit after midline positioning. Severe L lean and rigid throughout. Pt unable to self sustain upright posture.  Assisted to recliner was VERY difficult.  General transfer comment: Unable to use STEDY due to poor interaction and NO assist offered by pt.  Catatonic.  Eye contact only and a few words (repeats words).  "Bear Hug" 1/4 pivot from very elevated bed to recliner + 2 Total Assist. Pt was UNABLE to stand and support his own weight.  Positioned in recliner with multiple pillows to achieve midline positioning.  Pillows on LEFT side to prevent LEFT lean.  Grimacing with pain when elevated B LE's.  Rigid/tight knees.   Follow Up Recommendations  SNF     Equipment Recommendations  None recommended by PT    Recommendations for Other Services       Precautions / Restrictions Precautions Precaution Comments: CVA L Hemi    Mobility  Bed Mobility Overal bed mobility: Needs Assistance Bed Mobility: Supine to Sit     Supine to sit: Total assist;+2 for safety/equipment;+2 for physical assistance;HOB elevated     General bed mobility comments: total assist x 2 to transfer to side of bed. Exhibiting posterior  initial lean and needing assistance to sit up.  Pt offered 0% assist but did static sit after midline positioning. Severe L lean and rigid throughout.    Transfers Overall transfer level: Needs assistance Equipment used: None Transfers: Squat Pivot Transfers Sit to Stand: Total assist;+2 safety/equipment;+2 physical assistance;From elevated surface   Squat pivot transfers: Total assist;+2 physical assistance;+2 safety/equipment     General transfer comment: Unable to use STEDY due to poor interaction and NO assist offered by pt.  Catatonic.  Eye contact only and a few words (repeats words).  "Bear Hug" 1/4 pivot from very elevated bed to recliner + 2 Total Assist.  Ambulation/Gait             General Gait Details: unable   Stairs             Wheelchair Mobility    Modified Rankin (Stroke Patients Only)       Balance                                            Cognition Arousal/Alertness: Awake/alert;Lethargic Behavior During Therapy: Flat affect                                   General Comments: Pt awake but with poor eye contact and brief moments of grimmacing with mobility.  Remained Non Verbal  and catatonic throughout session.  Only a slight improvement from last session.      Exercises      General Comments        Pertinent Vitals/Pain Pain Assessment: No/denies pain Pain Location: very little grimacing with being repositioned od B knees into extension and L UE ROM Pain Descriptors / Indicators: Grimacing    Home Living                      Prior Function            PT Goals (current goals can now be found in the care plan section) Progress towards PT goals: Progressing toward goals    Frequency    Min 2X/week      PT Plan Current plan remains appropriate    Co-evaluation              AM-PAC PT "6 Clicks" Mobility   Outcome Measure  Help needed turning from your back to your side  while in a flat bed without using bedrails?: Total Help needed moving from lying on your back to sitting on the side of a flat bed without using bedrails?: Total Help needed moving to and from a bed to a chair (including a wheelchair)?: Total Help needed standing up from a chair using your arms (e.g., wheelchair or bedside chair)?: Total Help needed to walk in hospital room?: Total Help needed climbing 3-5 steps with a railing? : Total 6 Click Score: 6    End of Session Equipment Utilized During Treatment: Gait belt Activity Tolerance: Other (comment) (Catatonic) Patient left: in chair;with call bell/phone within reach Nurse Communication: Mobility status;Need for lift equipment (Maxi Move Pad in recliner under pt) PT Visit Diagnosis: History of falling (Z91.81);Muscle weakness (generalized) (M62.81)     Time: 1100-1118 PT Time Calculation (min) (ACUTE ONLY): 18 min  Charges:  $Therapeutic Activity: 8-22 mins                     {Linwood Gullikson  PTA Acute  Rehabilitation Services Pager      [phone] Office      [phone]

## 2021-01-28 DIAGNOSIS — Z8673 Personal history of transient ischemic attack (TIA), and cerebral infarction without residual deficits: Secondary | ICD-10-CM

## 2021-01-28 DIAGNOSIS — R531 Weakness: Secondary | ICD-10-CM

## 2021-01-28 LAB — GLUCOSE, CAPILLARY
Glucose-Capillary: 171 mg/dL — ABNORMAL HIGH (ref 70–99)
Glucose-Capillary: 193 mg/dL — ABNORMAL HIGH (ref 70–99)
Glucose-Capillary: 214 mg/dL — ABNORMAL HIGH (ref 70–99)

## 2021-01-28 NOTE — Progress Notes (Signed)
Occupational Therapy Treatment Patient Details Name: [name] MRN: [id number] DOB: 04-25-1951 Today's Date: 01/28/2021    History of present illness Patient is a 70 yo male admitted with encephalopathy, AKI, hypertensive urgency. Hx of CHF, frequent falls, chronic L sided weakness, diabetic neuropathy, prostate ca, MMI, CKD, renal transplant, CVA Left Hemi   OT comments  Patient was seated in recliner on this date with max A for repositioning in chair with L lateral leaning noted. Nurse made aware.patient participated in washing face with mod A to complete task with increased time.patient continued to need increased assistance for All ADL tasks with recommendations for 24/7 care.  Patient's discharge plan remains appropriate at this time. OT will continue to follow acutely.    Follow Up Recommendations  SNF    Equipment Recommendations  Other (comment) (defer to next venue)    Recommendations for Other Services      Precautions / Restrictions Precautions Precautions: Fall Restrictions Weight Bearing Restrictions: No       Mobility Bed Mobility Overal bed mobility: Needs Assistance             General bed mobility comments: patient was up in recliner during session.    Transfers                              ADL either performed or assessed with clinical judgement   ADL Overall ADL's : Needs assistance/impaired     Grooming: Wash/dry face;Moderate assistance;Sitting Grooming Details (indicate cue type and reason): patient was seated in recliner on this date with lateral lean to L side seated in chair. patient was repositioned in chair with pillow to bring patient back to midline. patient participated in wiping face with wash cloth with mod A to complete task. patient was noted to lean to L side and hold head. patient did not endource having head ache or tired. patient was noted to be alert in chair but was stil not verbally engaging in tasks. patient  was noted to have pallative team meeting today with son.                                      Cognition Arousal/Alertness: Awake/alert;Lethargic Behavior During Therapy: Flat affect Overall Cognitive Status: No family/caregiver present to determine baseline cognitive functioning                  General Comments: patient is awake today with poor eye contact and no verbalizations noted. patient still easily fatigued.                          Pertinent Vitals/ Pain       Pain Assessment: No/denies pain   Frequency  Min 2X/week        Progress Toward Goals  OT Goals(current goals can now be found in the care plan section)  Progress towards OT goals: Not progressing toward goals - comment     Plan Discharge plan remains appropriate    Co-evaluation                 AM-PAC OT "6 Clicks" Daily Activity     Outcome Measure   Help from another person eating meals?: Total Help from another person taking care of personal grooming?: Total Help from another person toileting, which includes using toliet, bedpan,  or urinal?: Total Help from another person bathing (including washing, rinsing, drying)?: Total Help from another person to put on and taking off regular upper body clothing?: Total Help from another person to put on and taking off regular lower body clothing?: Total 6 Click Score: 6    End of Session    OT Visit Diagnosis: Unsteadiness on feet (R26.81);Muscle weakness (generalized) (M62.81);History of falling (Z91.81);Other symptoms and signs involving cognitive function   Activity Tolerance Patient limited by fatigue   Patient Left in chair;with call bell/phone within reach;with chair alarm set   Nurse Communication Other (comment) (nurse cleared patient to participate in session)        Time: [id number] OT Time Calculation (min): 8 min  Charges: OT General Charges $OT Visit: 1 Visit OT Treatments $Self Care/Home  Management : 8-22 mins  Jackelyn Poling OTR/L, MS Acute Rehabilitation Department Office# [phone] Pager# [phone]    [facility] 01/28/2021, 4:48 PM

## 2021-01-28 NOTE — Plan of Care (Signed)
  Problem: Activity: Goal: Risk for activity intolerance will decrease Outcome: Progressing   Problem: Coping: Goal: Level of anxiety will decrease Outcome: Progressing   Problem: Elimination: Goal: Will not experience complications related to bowel motility Outcome: Progressing Goal: Will not experience complications related to urinary retention Outcome: Progressing   

## 2021-01-28 NOTE — Care Management Important Message (Signed)
Important Message  Patient Details IM Letter placed in Patient's room. Name: [name] MRN: [id number] Date of Birth: [birth year]   Medicare Important Message Given:  Yes     Kerin Salen 01/28/2021, 2:22 PM

## 2021-01-28 NOTE — Progress Notes (Signed)
Progress Note    [name]   SNK:[id number]  DOB: [birth year]  DOA: 01/13/2021     14  PCP: [name], MD  Initial CC: Altered mental status  Hospital Course: [name] is a 70 yo male with PMH CKD III s/p renal transplant on tacrolimus and mycophenolate, dCHF, CAD, HTN, HLD, OSA, CVA with left-sided weakness, diabetes with nephropathy, prostate cancer who was brought to the ED due to altered mental status.   Patient lives with son at home, EMS was called and he was hypotensive with blood pressure in the 80s, IV fluids were started and he was brought to the ED. In the ED he was noted to be alert awake and oriented blood work showed AKI on CKD, UA negative for nitrates and leukocyte esterase, UDS is positive for benzodiazepine; ammonia 12, CT head no acute finding.   APS became involved due to patient's living conditions at home and clinical status on arrival. His mentation worsened after admission and he underwent further work-up for his encephalopathy.  MRI brain was performed on 01/17/2021 and negative for acute stroke but did show mild chronic small vessel ischemic disease and cerebral atrophy.  Also chronic left cerebellar infarcts. He then developed new fever during hospitalization and repeat UA was obtained.  This showed moderate leukocyte esterase and 21-50 WBCs with many bacteria.  He was started on Zosyn.  Urine cultures grew E. coli and Enterobacter cloacae.  He completed course of Zosyn during hospitalization.  His mentation improved some but did not return back to his normal baseline.  Interval History:  No events overnight.  About the same as yesterday.  Nurse had some concern earlier this morning for possible facial droop but it was resolved by the time I rounded. He actually said a few words to me this morning which was somewhat of an improvement and could still follow some commands but otherwise remains very withdrawn.  ROS: Constitutional: negative for chills  and fevers, Respiratory: negative for cough and sputum, Cardiovascular: negative for chest pain, and Gastrointestinal: negative  Assessment & Plan: * Acute metabolic encephalopathy - As of 01/22/2021 he is oriented to his name, year, city and son's name who visited, [name]. -Etiology is likely mixed in setting of nutrient deficiencies, resolved infection, dehydration on admission with ketosis, and underlying cerebral atrophy - other notable workup: B12 414, folate 11.3 - will send off B1 level as well (level is 104, mid range so not likely to be Wernicke's) - this current mentation may be patient's new baseline; have communicated this to family - some concern for possible catatonia on 9/1. He had +benzo on UDS on admission and is on consistent valium at home; no valium since admission and patient was noted to be talking normally on admission then had a mentation change; some concern for benzo withdrawal although half life for valium is quite long; no significant improvement with ativan trial and therefore discontinued - EEG checked on 9/2, also negative for seizure activity; shows moderate diffuse encephalopathy - NH3 normal on admission 8/22 and on 8/26; repeated 9/2 and mildly elevated 42 (treated with lactulose enema); will continue on BID lactulose for now and also monitor mentation response  -Given poor improvement with patient unable to ambulate, will consult palliative care to help initiate some [place] discussions with his son, [name] - changed to DNR on 9/5 after [facility] discussion with family; meeting with family on 9/6 as well for more talks. After this if he remains stable could  d/c to SNF on Wed or Thurs.   Acute renal failure superimposed on stage 3b chronic kidney disease ([place]) - patient has history of CKD3b. Baseline creat ~ 1.6 - 1.7, eGFR 40 - patient presents with increase in creat >0.3 mg/dL above baseline, creat increase >1.5x baseline presumed to have occurred within past 7 days  PTA -Presumed prerenal from dehydration on admission - Creatinine peaked at 3.7 and has returned back to baseline   Acute lower UTI-resolved as of 01/22/2021 - Urine cultures grew E. coli and Enterobacter cloacae - Completed Zosyn course during hospitalization  Thyroid nodule - Last thyroid ultrasound 2017 noted with bilateral nodules with recommendation for 1 year follow-up.  Do not see a repeat ultrasound since then - TSH = 2.179 on 01/17/21 - will have patient follow up outpatient for repeat u/s  Physical deconditioning - Evaluated by PT/OT.  Plan is for discharge to SNF  Anxiety - d/c valium - ativan trial as above (did not respond)  Chronic diastolic CHF (congestive heart failure) (HCC) - No signs or symptoms of exacerbation -Last echo July 2021: EF greater than 46%, grade 1 diastolic dysfunction  DM (diabetes mellitus) type II controlled with renal manifestation (HCC) - Continue SSI and CBG monitoring -A1c 8.2% on 01/14/2021  Renal transplant recipient - Evaluated by nephrology - Tacrolimus level appropriate - Outpatient follow-up with transplant team  GERD (gastroesophageal reflux disease) - Continue Protonix  History of stroke - continue asa and plavix - MRI brain 8/26: negative for acute stroke.  Mild chronic small vessel ischemic disease and cerebral atrophy.  Chronic left cerebellar infarcts.  CAD -s/p DES to marginal vessel at [facility] 2014 - Continue aspirin, Plavix, statin  Gout - no signs of flare  Hypertensive urgency-resolved as of 01/22/2021 - Resolved with treatment   Old records reviewed in assessment of this patient  Antimicrobials: Zosyn 01/17/2021 >> 01/22/2021  DVT prophylaxis: heparin injection 5,000 Units Start: 01/15/21 1400 SCDs Start: 01/14/21 0740   Code Status:   Code Status: DNR Family Communication: son, [name]   Disposition Plan: Status is: Inpatient  Remains inpatient appropriate because:Unsafe d/c plan and Inpatient level of  care appropriate due to severity of illness  Dispo: The patient is from: Home              Anticipated d/c is to: SNF              Patient currently is medically stable to d/c.   Difficult to place patient No Risk of unplanned readmission score: Unplanned Admission- Pilot do not use: 24.64   Objective: Blood pressure 135/87, pulse 83, temperature 97.8 F (36.6 C), resp. rate 16, height 5' 10.5" (1.791 m), weight 91.4 kg, SpO2 100 %.  Examination: General appearance:  No distress.  Staring around the room. Head: Normocephalic, without obvious abnormality, atraumatic Eyes:  EOMI Lungs: clear to auscultation bilaterally Heart: regular rate and rhythm and S1, S2 normal Abdomen: normal findings: bowel sounds normal and soft, non-tender Extremities:  no edema MSK: pain with passive movement of LE's Skin: mobility and turgor normal Neurologic: Follows some commands, moves all 4 extremities spontaneously  Consultants:  Palliative care  Procedures:    Data Reviewed: I have personally reviewed following labs and imaging studies Results for orders placed or performed during the hospital encounter of 01/13/21 (from the past 24 hour(s))  Glucose, capillary     Status: Abnormal   Collection Time: 01/27/21  3:54 PM  Result Value Ref Range   Glucose-Capillary 231 (  H) 70 - 99 mg/dL  Glucose, capillary     Status: Abnormal   Collection Time: 01/27/21  8:20 PM  Result Value Ref Range   Glucose-Capillary 234 (H) 70 - 99 mg/dL  Glucose, capillary     Status: Abnormal   Collection Time: 01/28/21  7:31 AM  Result Value Ref Range   Glucose-Capillary 171 (H) 70 - 99 mg/dL  Glucose, capillary     Status: Abnormal   Collection Time: 01/28/21 11:27 AM  Result Value Ref Range   Glucose-Capillary 214 (H) 70 - 99 mg/dL    No results found for this or any previous visit (from the past 240 hour(s)).    Radiology Studies: No results found. DG Chest Port 1 View  Final Result    MR BRAIN WO  CONTRAST  Final Result    CT HEAD WO CONTRAST  Final Result      Scheduled Meds:  amLODipine  10 mg Oral Daily   aspirin  81 mg Oral QHS   atorvastatin  40 mg Oral Daily   chlorhexidine  15 mL Mouth Rinse BID   clopidogrel  75 mg Oral Daily   heparin injection (subcutaneous)  5,000 Units Subcutaneous Q8H   insulin aspart  0-15 Units Subcutaneous TID WC   insulin glargine-yfgn  8 Units Subcutaneous QHS   lactulose  20 g Oral BID   mouth rinse  15 mL Mouth Rinse q12n4p   mycophenolate  360 mg Oral Daily   And   mycophenolate  180 mg Oral Q2200   nortriptyline  100 mg Oral QHS   pantoprazole  40 mg Oral Daily   tacrolimus  5 mg Oral BID   thiamine  100 mg Oral Daily   PRN Meds: acetaminophen **OR** acetaminophen, bisacodyl, metoprolol tartrate Continuous Infusions:  lactated ringers Stopped (01/26/21 0934)     LOS: 14 days  Time spent: Greater than 50% of the 35 minute visit was spent in counseling/coordination of care for the patient as laid out in the A&P.   Dwyane Dee, MD [facility] 01/28/2021, 1:41 PM

## 2021-01-28 NOTE — Progress Notes (Signed)
Daily Progress Note   Patient Name: [name]       Date: 01/28/2021 DOB: 03-09-1951  Age: 70 y.o. MRN#: [id number] Attending Physician: Dwyane Dee, MD Primary Care Physician: [name], MD Admit Date: 01/13/2021 Length of Stay: 14 days  Reason for Consultation/Follow-up: Establishing goals of care  HPI/Patient Profile:  70 y.o. male  with past medical history of CKD III s/p renal transplant on tacrolimus and mycophenolate, dCHF, CAD, HTN, HLD, OSA, CVA with left-sided weakness, diabetes with nephropathy, prostate cancer admitted on 01/13/2021 with AMS, acute metabolic encephalopathy, AKI on CKD, UTI.    APS involved because of patient's living conditions. He lives with his son. His other son [name]) is primary contact. Initially on admission patient was AAO. However, since then his mental status has significantly declined. Extensive work-up with no identified etiology. Concern that this state of AMS and physical deconditioning may become his new baseline. Current plan is to d/c to SNF at [facility].   PMT was consulted for goals of care.   Subjective:   Subjective: Chart Reviewed. Updates received. Patient Assessed. Created space and opportunity for patient  and family to explore thoughts and feelings regarding current medical situation.  Today's Discussion: When I saw the patient in the morning he was attempting to sit up. When asked he admitted some back pain, was told nursing coming to get him into the bedside chair in a few minutes. Denies N/V. Much more interactive today, seems to be making more sense.  Family meeting was held at 2:00 pm with son [name] (in person) and daughter [name] (via telephone).  We reviewed the patient's current clinical status.  Reviewed previously made decisions on DNR.  We had a discussion about his expected trajectory and biggest obstacles being physical debility as well as waxing and waning mental status.  While we are hopeful for improvement in  both these areas, we discussed the realism that this did not become his eventual new baseline.  The plan is for discharge to skilled nursing facility/long-term care in the next 24 to 48 hours.  A MOST form was introduced and we used this opportunity to discuss aggressive care versus more limited care.  With 2 of the 3 little children present for the meeting we made decisions on a MOST form which was scanned to the chart and placed in the front of the physical chart.  These include antibiotics and IV fluids as indicated, okay to transfer back to the hospital, limited care including avoiding ICU if possible, although agreeable to ICU care if necessary.  Except a feeding tube for limited trial but would not want a prolonged/long-term feeding tube.  All questions and concerns were answered.  I encouraged the patient's family to call with any further questions as they arise.  Review of Systems  Gastrointestinal:  Negative for abdominal pain, nausea and vomiting.  Musculoskeletal:  Positive for back pain.   Objective:   Physical Exam: Physical Exam Vitals and nursing note reviewed.  Constitutional:      General: He is not in acute distress.    Appearance: He is obese. He is not toxic-appearing.  HENT:     Head: Normocephalic and atraumatic.  Cardiovascular:     Rate and Rhythm: Normal rate and regular rhythm.     Heart sounds: No murmur heard. Pulmonary:     Effort: Pulmonary effort is normal. No respiratory distress.     Breath sounds: Normal breath sounds. No wheezing or rhonchi.  Abdominal:  General: Abdomen is protuberant.     Palpations: Abdomen is soft.     Tenderness: There is no abdominal tenderness.  Musculoskeletal:     Comments: Trying to sit up in bed, affirms some back pain (consistent with history). Informed nursing staff to get him into the chair shortly  Skin:    General: Skin is warm and dry.  Neurological:     Mental Status: He is alert.     Comments: Some improvement  in interaction and mental status noted today  Psychiatric:        Mood and Affect: Mood normal.        Behavior: Behavior normal.    Palliative Assessment/Data: 40%   Assessment & Plan:   Impression: 70 year old male brought in from "home" (living with daughter and son) with dehydration, malnutrition, AKI on CKD s/p renal transplant. Patient with multiple chornic illnesses, now with AMS likely multifactorial. Overall mental status has improved, but not at baseline (and question of whether it could reasonably be expected to return to baseline). Plan for SNF discharge. Some noted improvement today. We were consulted to [place] discussion. Difficult family situation, as outlined above. Apparent 3 children with legal standing to make decisions with two in favor of DNR. Planned family meeting 2:00 today. MOST form was completed with family input.  SUMMARY OF RECOMMENDATIONS   MOST form completed Remains DNR Planned admission to SNF in 24-48 hours Continue to treat the treatable PMT will continue to follow  Code Status: DNR  Prognosis: Unable to determine  Discharge Planning: To Be Determined  Discussed with: Patient's son [name], daughter [name], Dr. Sabino Gasser, Nursing staff  Thank you for allowing [place] to participate in the care of [name] PMT will continue to support holistically.  Time Total: 105 min in total (morning and afternoon)  Visit consisted of counseling and education dealing with the complex and emotionally intense issues of symptom management and palliative care in the setting of serious and potentially life-threatening illness. Greater than 50%  of this time was spent counseling and coordinating care related to the above assessment and plan.  Walden Field, NP Palliative Medicine Team  Team Phone # [phone] (Nights/Weekends)  01/21/2021, 8:17 AM

## 2021-01-28 NOTE — Progress Notes (Signed)
Patient alert and orientated to self, answer yes/no to some of the questions. ?? Slight lip droop and delayed swallowing noted, vitals 97.7, 140/84, 82, 18, 100%-RA. Dr. Sabino Gasser notified, will continue to assess patient.

## 2021-01-29 DIAGNOSIS — Z8546 Personal history of malignant neoplasm of prostate: Secondary | ICD-10-CM | POA: Diagnosis not present

## 2021-01-29 DIAGNOSIS — G459 Transient cerebral ischemic attack, unspecified: Secondary | ICD-10-CM | POA: Diagnosis not present

## 2021-01-29 DIAGNOSIS — M6281 Muscle weakness (generalized): Secondary | ICD-10-CM | POA: Diagnosis not present

## 2021-01-29 DIAGNOSIS — R410 Disorientation, unspecified: Secondary | ICD-10-CM | POA: Diagnosis not present

## 2021-01-29 DIAGNOSIS — E1122 Type 2 diabetes mellitus with diabetic chronic kidney disease: Secondary | ICD-10-CM | POA: Diagnosis not present

## 2021-01-29 DIAGNOSIS — I6350 Cerebral infarction due to unspecified occlusion or stenosis of unspecified cerebral artery: Secondary | ICD-10-CM | POA: Diagnosis not present

## 2021-01-29 DIAGNOSIS — N1832 Chronic kidney disease, stage 3b: Secondary | ICD-10-CM | POA: Diagnosis not present

## 2021-01-29 DIAGNOSIS — G9341 Metabolic encephalopathy: Secondary | ICD-10-CM | POA: Diagnosis not present

## 2021-01-29 DIAGNOSIS — N183 Chronic kidney disease, stage 3 unspecified: Secondary | ICD-10-CM | POA: Diagnosis not present

## 2021-01-29 DIAGNOSIS — Z7401 Bed confinement status: Secondary | ICD-10-CM | POA: Diagnosis not present

## 2021-01-29 DIAGNOSIS — E118 Type 2 diabetes mellitus with unspecified complications: Secondary | ICD-10-CM | POA: Diagnosis not present

## 2021-01-29 DIAGNOSIS — R4182 Altered mental status, unspecified: Secondary | ICD-10-CM | POA: Diagnosis not present

## 2021-01-29 DIAGNOSIS — E1165 Type 2 diabetes mellitus with hyperglycemia: Secondary | ICD-10-CM | POA: Diagnosis not present

## 2021-01-29 DIAGNOSIS — N390 Urinary tract infection, site not specified: Secondary | ICD-10-CM | POA: Diagnosis not present

## 2021-01-29 DIAGNOSIS — I501 Left ventricular failure: Secondary | ICD-10-CM | POA: Diagnosis not present

## 2021-01-29 DIAGNOSIS — E1140 Type 2 diabetes mellitus with diabetic neuropathy, unspecified: Secondary | ICD-10-CM | POA: Diagnosis not present

## 2021-01-29 DIAGNOSIS — K219 Gastro-esophageal reflux disease without esophagitis: Secondary | ICD-10-CM | POA: Diagnosis not present

## 2021-01-29 DIAGNOSIS — G8191 Hemiplegia, unspecified affecting right dominant side: Secondary | ICD-10-CM | POA: Diagnosis not present

## 2021-01-29 DIAGNOSIS — I2510 Atherosclerotic heart disease of native coronary artery without angina pectoris: Secondary | ICD-10-CM | POA: Diagnosis not present

## 2021-01-29 DIAGNOSIS — I5032 Chronic diastolic (congestive) heart failure: Secondary | ICD-10-CM | POA: Diagnosis not present

## 2021-01-29 DIAGNOSIS — Z940 Kidney transplant status: Secondary | ICD-10-CM | POA: Diagnosis not present

## 2021-01-29 DIAGNOSIS — W19XXXA Unspecified fall, initial encounter: Secondary | ICD-10-CM | POA: Diagnosis not present

## 2021-01-29 DIAGNOSIS — E7220 Disorder of urea cycle metabolism, unspecified: Secondary | ICD-10-CM | POA: Diagnosis not present

## 2021-01-29 DIAGNOSIS — Y92129 Unspecified place in nursing home as the place of occurrence of the external cause: Secondary | ICD-10-CM | POA: Diagnosis not present

## 2021-01-29 DIAGNOSIS — R278 Other lack of coordination: Secondary | ICD-10-CM | POA: Diagnosis not present

## 2021-01-29 DIAGNOSIS — E860 Dehydration: Secondary | ICD-10-CM | POA: Diagnosis not present

## 2021-01-29 DIAGNOSIS — R464 Slowness and poor responsiveness: Secondary | ICD-10-CM | POA: Diagnosis present

## 2021-01-29 DIAGNOSIS — R1312 Dysphagia, oropharyngeal phase: Secondary | ICD-10-CM | POA: Diagnosis not present

## 2021-01-29 DIAGNOSIS — N179 Acute kidney failure, unspecified: Secondary | ICD-10-CM | POA: Diagnosis not present

## 2021-01-29 DIAGNOSIS — I10 Essential (primary) hypertension: Secondary | ICD-10-CM | POA: Diagnosis not present

## 2021-01-29 DIAGNOSIS — E1143 Type 2 diabetes mellitus with diabetic autonomic (poly)neuropathy: Secondary | ICD-10-CM | POA: Diagnosis not present

## 2021-01-29 DIAGNOSIS — K5901 Slow transit constipation: Secondary | ICD-10-CM | POA: Diagnosis not present

## 2021-01-29 DIAGNOSIS — E1129 Type 2 diabetes mellitus with other diabetic kidney complication: Secondary | ICD-10-CM | POA: Diagnosis not present

## 2021-01-29 DIAGNOSIS — E782 Mixed hyperlipidemia: Secondary | ICD-10-CM | POA: Diagnosis not present

## 2021-01-29 DIAGNOSIS — G9340 Encephalopathy, unspecified: Secondary | ICD-10-CM | POA: Diagnosis not present

## 2021-01-29 DIAGNOSIS — E041 Nontoxic single thyroid nodule: Secondary | ICD-10-CM | POA: Diagnosis not present

## 2021-01-29 DIAGNOSIS — M1A9XX0 Chronic gout, unspecified, without tophus (tophi): Secondary | ICD-10-CM | POA: Diagnosis not present

## 2021-01-29 DIAGNOSIS — R531 Weakness: Secondary | ICD-10-CM | POA: Diagnosis not present

## 2021-01-29 DIAGNOSIS — I5022 Chronic systolic (congestive) heart failure: Secondary | ICD-10-CM | POA: Diagnosis not present

## 2021-01-29 DIAGNOSIS — I130 Hypertensive heart and chronic kidney disease with heart failure and stage 1 through stage 4 chronic kidney disease, or unspecified chronic kidney disease: Secondary | ICD-10-CM | POA: Diagnosis not present

## 2021-01-29 LAB — GLUCOSE, CAPILLARY
Glucose-Capillary: 223 mg/dL — ABNORMAL HIGH (ref 70–99)
Glucose-Capillary: 219 mg/dL — ABNORMAL HIGH (ref 70–99)

## 2021-01-29 LAB — RESP PANEL BY RT-PCR (FLU A&B, COVID) ARPGX2
Influenza A by PCR: NEGATIVE
Influenza B by PCR: NEGATIVE
SARS Coronavirus 2 by RT PCR: NEGATIVE

## 2021-01-29 MED ORDER — BISACODYL 10 MG RE SUPP: 10.0000 mg | Freq: Every day | RECTAL | 0 refills | Status: AC | PRN

## 2021-01-29 MED ORDER — THIAMINE HCL 100 MG PO TABS: 100.0000 mg | ORAL_TABLET | Freq: Every day | ORAL | Status: AC

## 2021-01-29 MED ORDER — LACTULOSE 10 GM/15ML PO SOLN: 20.0000 g | Freq: Two times a day (BID) | ORAL | 0 refills | Status: AC

## 2021-01-29 MED ORDER — AMLODIPINE BESYLATE 10 MG PO TABS: 10.0000 mg | ORAL_TABLET | Freq: Every day | ORAL | 0 refills | Status: AC

## 2021-01-29 MED ORDER — INSULIN GLARGINE-YFGN 100 UNIT/ML ~~LOC~~ SOLN: 8.0000 [IU] | Freq: Every day | SUBCUTANEOUS | 11 refills | Status: AC

## 2021-01-29 NOTE — TOC Transition Note (Addendum)
Transition of Care [facility]) - CM/SW Discharge Note   Patient Details  Name: [name] MRN: [id number] Date of Birth: 1950/08/15  [facility] CM/SW Contact:  Dessa Phi, RN Phone Number: 01/29/2021, 11:46 AM   Clinical Narrative: Received updated Blaine Hamper [id number] [name] [id number] Pl rep Audelia Acton has a bed,waiting on rm#,nsg tel# for report, & covid results. Spoke to son [name] informed of d/c to [address] today.Informed APS case worker IT sales professional of d/c today to [address]. 12:34p-going to rm 306,nsg call report tel#[phone]. Covid results neg,d/c summary sent to [facility].PTAR called.No further CM needs.    Final next level of care: Skilled Nursing Facility Barriers to Discharge: No Barriers Identified   Patient Goals and CMS Choice Patient states their goals for this hospitalization and ongoing recovery are:: go to rehab CMS Medicare.gov Compare Post Acute Care list provided to:: Patient Choice offered to / list presented to : Adult Children, Patient (patient, & son [name] [id number])  Discharge Placement                       Discharge Plan and Services   Discharge Planning Services: CM Consult                                 Social Determinants of Health (SDOH) Interventions     Readmission Risk Interventions No flowsheet data found.

## 2021-01-29 NOTE — Progress Notes (Signed)
Physical Therapy Treatment Patient Details Name: [name] MRN: [id number] DOB: 1951/04/02 Today's Date: 01/29/2021    History of Present Illness Patient is a 70 yo male admitted with encephalopathy, AKI, hypertensive urgency. Hx of CHF, frequent falls, chronic L sided weakness, diabetic neuropathy, prostate ca, MMI, CKD, renal transplant, CVA Left Hemi    PT Comments    Pt remains Non Verbal with poor interaction. Assisted OOB was difficult.  General bed mobility comments: Total Assist + 2 pt <5% to transition to EOB.  Severe posterior lean and poor righting. General transfer comment: Unable to use STEDY due to poor interaction and NO assist offered by pt.  Catatonic.  Eye contact only and a few words (repeats words).  "Bear Hug" 1/4 pivot from very elevated bed to [facility] then from [facility] to recliner + 2 Total Assist. Positioned in recliner due to severe Left lean.   Follow Up Recommendations  SNF     Equipment Recommendations  None recommended by PT    Recommendations for Other Services       Precautions / Restrictions Precautions Precautions: Fall Precaution Comments: CVA L Hemi Restrictions Weight Bearing Restrictions: No    Mobility  Bed Mobility Overal bed mobility: Needs Assistance       Supine to sit: Total assist;+2 for safety/equipment;+2 for physical assistance;HOB elevated     General bed mobility comments: Total Assist + 2 pt <5% to transition to EOB.  Severe posterior lean and poor righting.    Transfers Overall transfer level: Needs assistance Equipment used: None Transfers: Stand Pivot Transfers Sit to Stand: Total assist;+2 safety/equipment;+2 physical assistance;From elevated surface   Squat pivot transfers: Total assist;+2 physical assistance;+2 safety/equipment     General transfer comment: Unable to use STEDY due to poor interaction and NO assist offered by pt.  Catatonic.  Eye contact only and a few words (repeats words).  "Bear Hug" 1/4 pivot  from very elevated bed to [facility] then from [facility] to recliner + 2 Total Assist.  Ambulation/Gait             General Gait Details: unable   Stairs             Wheelchair Mobility    Modified Rankin (Stroke Patients Only)       Balance                                            Cognition Arousal/Alertness: Awake/alert;Lethargic Behavior During Therapy: Flat affect Overall Cognitive Status: No family/caregiver present to determine baseline cognitive functioning                                 General Comments: awake but mostly non verbal and brief attention      Exercises      General Comments        Pertinent Vitals/Pain Pain Assessment: Faces Pain Location: very little grimacing with being repositioned od B knees into extension and L UE ROM Pain Descriptors / Indicators: Grimacing Pain Intervention(s): Monitored during session;Repositioned    Home Living                      Prior Function            PT Goals (current goals can now be found in the care  plan section) Progress towards PT goals: Progressing toward goals    Frequency    Min 2X/week      PT Plan Current plan remains appropriate    Co-evaluation              AM-PAC PT "6 Clicks" Mobility   Outcome Measure  Help needed turning from your back to your side while in a flat bed without using bedrails?: Total Help needed moving from lying on your back to sitting on the side of a flat bed without using bedrails?: Total Help needed moving to and from a bed to a chair (including a wheelchair)?: Total Help needed standing up from a chair using your arms (e.g., wheelchair or bedside chair)?: Total Help needed to walk in hospital room?: Total Help needed climbing 3-5 steps with a railing? : Total 6 Click Score: 6    End of Session Equipment Utilized During Treatment: Gait belt   Patient left: in chair;with call bell/phone within  reach Nurse Communication: Mobility status;Need for lift equipment PT Visit Diagnosis: History of falling (Z91.81);Muscle weakness (generalized) (M62.81)     Time: [id number] PT Time Calculation (min) (ACUTE ONLY): 25 min  Charges:  $Therapeutic Activity: 23-37 mins                     {Jaycey Gens  PTA Acute  Rehabilitation Services Pager      [phone] Office      [phone]

## 2021-01-29 NOTE — Progress Notes (Signed)
Report called to [place] at [facility], waiting on PTAR fot transportation to facility.

## 2021-01-29 NOTE — Discharge Summary (Signed)
Physician Discharge Summary  [name] PRX:[id number] DOB: Aug 06, 1950 DOA: 01/13/2021  PCP: Ronald Olp, MD  Admit date: 01/13/2021 Discharge date: 01/29/2021  Admitted From: home  Disposition:  nursing home   Recommendations for Outpatient Follow-up:  Follow up with PCP in 1-2 weeks Please obtain BMP/CBC in one week Follow-up with transplant team  Home Health: None Equipment/Devices: None Discharge Condition: Stable CODE STATUS: DO NOT RESUSCITATE Diet recommendation: Cardiac diet Brief/Interim Summary:[name] is a 70 yo male with PMH CKD III s/p renal transplant on tacrolimus and mycophenolate, dCHF, CAD, HTN, HLD, OSA, CVA with left-sided weakness, diabetes with nephropathy, prostate cancer who was brought to the ED due to altered mental status.   Patient lived with son at home, EMS was called and he was hypotensive with blood pressure in the 80s, IV fluids were started and he was brought to the ED. In the ED he was noted to be alert awake and oriented blood work showed AKI on CKD, UA negative for nitrates and leukocyte esterase, UDS is positive for benzodiazepine; ammonia 12, CT head no acute finding.   APS became involved due to patient's living conditions at home and clinical status on arrival. His mentation worsened after admission and he underwent further work-up for his encephalopathy.  MRI brain was performed on 01/17/2021 and negative for acute stroke but did show mild chronic small vessel ischemic disease and cerebral atrophy.  Also chronic left cerebellar infarcts. He then developed new fever during hospitalization and repeat UA was obtained.  This showed moderate leukocyte esterase and 21-50 WBCs with many bacteria.  He was started on Zosyn.  Urine cultures grew E. coli and Enterobacter cloacae.  He completed course of Zosyn during hospitalization.  His mentation improved some but did not return back to his normal baseline.   Discharge Diagnoses:  Principal  Problem:   Acute metabolic encephalopathy Active Problems:   Gout   CAD -s/p DES to marginal vessel at [facility]   History of stroke   GERD (gastroesophageal reflux disease)   Renal transplant recipient   Acute renal failure superimposed on stage 3b chronic kidney disease (HCC)   DM (diabetes mellitus) type II controlled with renal manifestation (HCC)   Chronic diastolic CHF (congestive heart failure) (HCC)   Anxiety   Thyroid nodule   Physical deconditioning   Acute metabolic encephalopathy- -Etiology is likely mixed in setting of nutrient deficiencies, resolved infection, dehydration on admission with ketosis, and underlying cerebral atrophy - other notable workup: B12 414, folate 11.3 - B1 level is 104, mid range so not likely to be Wernicke's - this current mentation may be patient's new baseline; have communicated this to family - some concern for possible catatonia on 9/1. He had +benzo on UDS on admission and is on consistent valium at home; no valium since admission and patient was noted to be talking normally on admission then had a mentation change; some concern for benzo withdrawal although half life for valium is quite long; no significant improvement with ativan trial and therefore discontinued - EEG checked on 9/2, also negative for seizure activity; shows moderate diffuse encephalopathy - NH3 normal on admission 8/22 and on 8/26; repeated 9/2 and mildly elevated 42 (treated with lactulose enema); will continue on BID lactulose for now and also monitor mentation response  -Given poor improvement with patient unable to ambulate, consulted palliative care to help initiate some [place] discussions with his son, [name] - changed to DNR on 9/5 after Palliative care discussion  with family   Acute renal failure superimposed on stage 3b chronic kidney disease ([place]) - patient has history of CKD3b. Baseline creat ~ 1.6 - 1.7, eGFR 40.  Creatinine peaked at 3.7 and returned back to  baseline with IV fluids.  This was presumed to be prerenal from dehydration on admission.   Acute lower UTI-resolved as of 01/22/2021 - Urine cultures grew E. coli and Enterobacter cloacae - Completed Zosyn course during hospitalization   Thyroid nodule - TSH = 2.179 on 01/17/21 Need outpatient follow up ultrasound   Physical deconditioning - Evaluated by PT/OT.  Plan is for discharge to SNF   Anxiety - d/c valium - ativan trial as above (did not respond)   Chronic diastolic CHF (congestive heart failure) (HCC) - No signs or symptoms of exacerbation -Last echo July 2021: EF greater than 73%, grade 1 diastolic dysfunction -Was on Lasix 80 mg 3 times a day at home per EMR.  This was stopped upon discharge, this will need to be reevaluated as an outpatient and consider restarting Lasix at a lower dose if needed.   DM (diabetes mellitus) type II controlled with renal manifestation (HCC) - Continue SSI and Lantus -A1c 8.2% on 01/14/2021   Renal transplant recipient - Evaluated by nephrology - Tacrolimus level appropriate - Outpatient follow-up with transplant team  GERD (gastroesophageal reflux disease) - Continue Protonix   History of stroke - continue asa and plavix - MRI brain 8/26: negative for acute stroke.  Mild chronic small vessel ischemic disease and cerebral atrophy.  Chronic left cerebellar infarcts.   CAD -s/p DES to marginal vessel at [facility] - Continue aspirin, Plavix, statin   Gout - no signs of flare -Continue allopurinol   Hypertensive urgency-resolved as of 01/22/2021 - Resolved with treatment Continue Norvasc  Estimated body mass index is 28.5 kg/m as calculated from the following:   Height as of this encounter: 5' 10.5" (1.791 m).   Weight as of this encounter: 91.4 kg.  Discharge Instructions  Discharge Instructions     Diet - low sodium heart healthy   Complete by: As directed    Increase activity slowly   Complete by: As directed        Allergies as of 01/29/2021       Reactions   Codeine         Medication List     STOP taking these medications    AMBULATORY NON FORMULARY MEDICATION   AMBULATORY NON FORMULARY MEDICATION   atorvastatin 40 MG tablet Commonly known as: LIPITOR   diazepam 5 MG tablet Commonly known as: VALIUM   diclofenac Sodium 1 % Gel Commonly known as: VOLTAREN   furosemide 80 MG tablet Commonly known as: LASIX   gabapentin 300 MG capsule Commonly known as: NEURONTIN   hydrocortisone cream 1 %   insulin aspart protamine- aspart (70-30) 100 UNIT/ML injection Commonly known as: NOVOLOG MIX 70/30   Insulin Syringe-Needle U-100 31G X 5/16" 1 ML Misc Commonly known as: B-D INS SYR ULTRAFINE 1CC/31G   Magnesium 200 MG Tabs   nitroGLYCERIN 0.4 MG SL tablet Commonly known as: NITROSTAT   nortriptyline 50 MG capsule Commonly known as: PAMELOR   Ozempic (0.25 or 0.5 MG/DOSE) 2 MG/1.5ML Sopn Generic drug: Semaglutide(0.25 or 0.5MG /DOS)   tiZANidine 2 MG tablet Commonly known as: ZANAFLEX       TAKE these medications    allopurinol 100 MG tablet Commonly known as: ZYLOPRIM Take 200 mg by mouth daily.   amLODipine 10  MG tablet Commonly known as: NORVASC Take 1 tablet (10 mg total) by mouth daily. Start taking on: January 30, 2021   aspirin 81 MG tablet Take 81 mg by mouth at bedtime.   bisacodyl 10 MG suppository Commonly known as: DULCOLAX Place 1 suppository (10 mg total) rectally daily as needed for moderate constipation.   clopidogrel 75 MG tablet Commonly known as: PLAVIX Take 75 mg by mouth daily.   insulin glargine-yfgn 100 UNIT/ML injection Commonly known as: SEMGLEE Inject 0.08 mLs (8 Units total) into the skin at bedtime.   lactulose 10 GM/15ML solution Commonly known as: CHRONULAC Take 30 mLs (20 g total) by mouth 2 (two) times daily.   mycophenolate 180 MG EC tablet Commonly known as: MYFORTIC Take 180-360 mg by mouth See admin instructions.  Take 2 tablets ($RemoveBe'360mg'zXmAQBccs$ ) by mouth in the morning and 1 tablet ($RemoveB'180mg'ioOdqzxe$ ) by mouth at night.   omeprazole 20 MG capsule Commonly known as: PRILOSEC TAKE 1 CAPSULE (20 MG TOTAL) BY MOUTH 2 (TWO) TIMES DAILY BEFORE A MEAL.   tacrolimus 5 MG capsule Commonly known as: PROGRAF Take 5 mg by mouth 2 (two) times daily.   thiamine 100 MG tablet Take 1 tablet (100 mg total) by mouth daily. Start taking on: January 30, 2021   Vitamin D (Ergocalciferol) 1.25 MG (50000 UNIT) Caps capsule Commonly known as: DRISDOL Take 50,000 Units by mouth 2 (two) times a week.        Contact information for follow-up providers     Ronald Olp, MD Follow up.   Specialty: Family Medicine Contact information: [place] [address] [phone]         Lelon Perla, MD .   Specialty: Cardiology Contact information: [address] [place] [address] [phone]              Contact information for after-discharge care     Destination     [facility] .   Service: Skilled Nursing Contact information: [address] [place] [phone]                    Allergies  Allergen Reactions   Codeine     Consultations: Palliative care nephrology   Procedures/Studies: CT HEAD WO CONTRAST  Result Date: 01/13/2021 CLINICAL DATA:  Weakness, altered mental status EXAM: CT HEAD WITHOUT CONTRAST TECHNIQUE: Contiguous axial images were obtained from the base of the skull through the vertex without intravenous contrast. COMPARISON:  12/10/2020 FINDINGS: Brain: There is atrophy and chronic small vessel disease changes. No acute intracranial abnormality. Specifically, no hemorrhage, hydrocephalus, mass lesion, acute infarction, or significant intracranial injury. Vascular: No hyperdense vessel or unexpected calcification. Skull: No acute calvarial abnormality. Sinuses/Orbits: Air-fluid levels in  the maxillary sinuses bilaterally. Other: None IMPRESSION: Atrophy, chronic microvascular disease. No acute intracranial abnormality. Acute maxillary sinusitis. Electronically Signed   By: Rolm Baptise M.D.   On: 01/13/2021 18:11   MR BRAIN WO CONTRAST  Result Date: 01/17/2021 CLINICAL DATA:  Delirium. EXAM: MRI HEAD WITHOUT CONTRAST TECHNIQUE: Multiplanar, multiecho pulse sequences of the brain and surrounding structures were obtained without intravenous contrast. COMPARISON:  Head CT 01/13/2021 and MRI 12/19/2019 FINDINGS: Some sequences are up to moderately motion degraded. Brain: There is no evidence of an acute infarct, intracranial hemorrhage, mass, midline shift, or extra-axial fluid collection. T2 hyperintensities in the cerebral white matter are unchanged from the prior MRI and are nonspecific but compatible with  mild chronic small vessel ischemic disease. Small chronic left cerebellar infarcts are also unchanged. Generalized cerebral atrophy is mild for age. Vascular: Major intracranial vascular flow voids are preserved. Skull and upper cervical spine: Chronic nonspecific bone marrow heterogeneity. No suspicious focal skull lesion. Sinuses/Orbits: Unremarkable orbits. Mild mucosal thickening in the maxillary and sphenoid sinuses. Clear mastoid air cells. Other: None. IMPRESSION: 1. No acute intracranial abnormality. 2. Mild chronic small vessel ischemic disease and cerebral atrophy. 3. Chronic left cerebellar infarcts. Electronically Signed   By: Logan Bores M.D.   On: 01/17/2021 14:33   DG Chest Port 1 View  Result Date: 01/17/2021 CLINICAL DATA:  Fever. EXAM: PORTABLE CHEST 1 VIEW COMPARISON:  December 09, 2020. FINDINGS: The heart size and mediastinal contours are within normal limits. Hypoinflation of the lungs is noted with mild bibasilar subsegmental atelectasis. The visualized skeletal structures are unremarkable. IMPRESSION: Hypoinflation of the lungs with mild bibasilar subsegmental  atelectasis. Electronically Signed   By: Marijo Conception M.D.   On: 01/17/2021 16:10   EEG adult  Result Date: 01/24/2021 Lora Havens, MD     01/24/2021  7:03 PM Patient Name: [name] MRN: [id number] Epilepsy Attending: Lora Havens Referring Physician/Provider: Dwyane Dee Date: 01/24/2021 Duration: 23.50 mins Patient history: 70yo M with ams. EEG to evaluate for seizure Level of alertness: Asleep, lethargic AEDs during EEG study: None Technical aspects: This EEG study was done with scalp electrodes positioned according to the 10-20 International system of electrode placement. Electrical activity was acquired at a sampling rate of $Remov'500Hz'vBpDCb$  and reviewed with a high frequency filter of $RemoveB'70Hz'SlJGNdvQ$  and a low frequency filter of $RemoveB'1Hz'heykHuMk$ . EEG data were recorded continuously and digitally stored. Description: No posterior dominant rhythm was seen. Sleep was characterized by vertex waves, sleep spindles (12 to 14 Hz), maximal frontocentral region.  EEG showed continuous generalized 3 to 6 Hz theta-delta slowing.  Hyperventilation and photic stimulation were not performed.   ABNORMALITY - Continuous slow, generalized IMPRESSION: This study is suggestive of moderate diffuse encephalopathy, nonspecific etiology. No seizures or epileptiform discharges were seen throughout the recording. Priyanka Barbra Sarks   (Echo, Carotid, EGD, Colonoscopy, ERCP)    Subjective: He is laying in bed with eyes open but does not speak to me or follow commands or answer questions he did try to squeeze my left hand when I asked him to squeeze but he is very weak  Discharge Exam: Vitals:   01/29/21 0905 01/29/21 1152  BP: (!) 148/90 119/82  Pulse:  96  Resp:  17  Temp:  97.7 F (36.5 C)  SpO2:  98%   Vitals:   01/28/21 2035 01/29/21 0452 01/29/21 0905 01/29/21 1152  BP: 125/85 (!) 143/89 (!) 148/90 119/82  Pulse: 87 92  96  Resp: $Remo'20 20  17  'iiRQU$ Temp: 97.6 F (36.4 C) 98.3 F (36.8 C)  97.7 F (36.5 C)  TempSrc: Oral Oral   Oral  SpO2: 97% 99%  98%  Weight:      Height:        General: Pt is alert, awake, not in acute distress Cardiovascular: RRR, S1/S2 +, no rubs, no gallops Respiratory: CTA bilaterally, no wheezing, no rhonchi Abdominal: Soft, NT, ND, bowel sounds + Extremities: no edema, no cyanosis    The results of significant diagnostics from this hospitalization (including imaging, microbiology, ancillary and laboratory) are listed below for reference.     Microbiology: No results found for this or any previous visit (from the past 240  hour(s)).   Labs: BNP (last 3 results) No results for input(s): BNP in the last 8760 hours. Basic Metabolic Panel: Recent Labs  Lab 01/23/21 0503 01/24/21 0709 01/24/21 2346 01/25/21 0541 01/26/21 0501 01/27/21 0423  NA 144 139 140 142 141 144  K 4.3 4.5 4.4 4.7 4.5 4.4  CL 111 109 106 112* 111 116*  CO2 25 19* 25 23 20* 21*  GLUCOSE 211* 205* 164* 183* 164* 168*  BUN 21 20 26* 27* 30* 27*  CREATININE 1.50* 1.35* 1.70* 1.73* 1.75* 1.52*  CALCIUM 10.4* 10.1 10.1 10.5* 9.5 10.0  MG 1.5* 1.8  --  1.8 1.7 1.8   Liver Function Tests: Recent Labs  Lab 01/24/21 0709 01/24/21 2346 01/25/21 0541 01/26/21 0501 01/27/21 0423  AST 37 36 36 30 19  ALT 50* 49* 50* 41 33  ALKPHOS 68 70 64 57 60  BILITOT 1.0 1.0 0.8 0.7 0.8  PROT 7.3 7.3 7.0 6.1* 6.7  ALBUMIN 3.3* 3.2* 3.1* 2.9* 3.0*   No results for input(s): LIPASE, AMYLASE in the last 168 hours. Recent Labs  Lab 01/24/21 2346 01/26/21 0501  AMMONIA 42* 33   CBC: Recent Labs  Lab 01/23/21 0503 01/24/21 0709 01/25/21 0541 01/26/21 0501 01/27/21 0423  WBC 2.7* 4.7 4.7 3.0* 2.9*  NEUTROABS 1.8 3.8 3.3 2.2 2.0  HGB 15.7 16.6 16.0 15.3 15.8  HCT 50.9 52.4* 51.5 50.4 52.1*  MCV 85.1 84.5 85.7 87.5 87.7  PLT 235 286 257 240 208   Cardiac Enzymes: No results for input(s): CKTOTAL, CKMB, CKMBINDEX, TROPONINI in the last 168 hours. BNP: Invalid input(s): POCBNP CBG: Recent Labs  Lab  01/28/21 0731 01/28/21 1127 01/28/21 1648 01/28/21 2334 01/29/21 1149  GLUCAP 171* 214* 223* 193* 219*   D-Dimer No results for input(s): DDIMER in the last 72 hours. Hgb A1c No results for input(s): HGBA1C in the last 72 hours. Lipid Profile No results for input(s): CHOL, HDL, LDLCALC, TRIG, CHOLHDL, LDLDIRECT in the last 72 hours. Thyroid function studies No results for input(s): TSH, T4TOTAL, T3FREE, THYROIDAB in the last 72 hours.  Invalid input(s): FREET3 Anemia work up No results for input(s): VITAMINB12, FOLATE, FERRITIN, TIBC, IRON, RETICCTPCT in the last 72 hours. Urinalysis    Component Value Date/Time   COLORURINE YELLOW 01/17/2021 1138   APPEARANCEUR HAZY (A) 01/17/2021 1138   LABSPEC 1.010 01/17/2021 1138   PHURINE 6.0 01/17/2021 1138   GLUCOSEU 50 (A) 01/17/2021 1138   HGBUR MODERATE (A) 01/17/2021 1138   BILIRUBINUR NEGATIVE 01/17/2021 1138   KETONESUR 20 (A) 01/17/2021 1138   PROTEINUR NEGATIVE 01/17/2021 1138   UROBILINOGEN 0.2 01/15/2015 1642   NITRITE NEGATIVE 01/17/2021 1138   LEUKOCYTESUR MODERATE (A) 01/17/2021 1138   Sepsis Labs Invalid input(s): PROCALCITONIN,  WBC,  LACTICIDVEN Microbiology No results found for this or any previous visit (from the past 240 hour(s)).   Time coordinating discharge: 38 minutes  SIGNED:   Georgette Shell, MD  [facility] 01/29/2021, 12:11 PM

## 2021-01-30 DIAGNOSIS — E118 Type 2 diabetes mellitus with unspecified complications: Secondary | ICD-10-CM | POA: Diagnosis not present

## 2021-01-31 DIAGNOSIS — E860 Dehydration: Secondary | ICD-10-CM | POA: Diagnosis not present

## 2021-01-31 DIAGNOSIS — I10 Essential (primary) hypertension: Secondary | ICD-10-CM | POA: Diagnosis not present

## 2021-01-31 DIAGNOSIS — E1122 Type 2 diabetes mellitus with diabetic chronic kidney disease: Secondary | ICD-10-CM | POA: Diagnosis not present

## 2021-01-31 DIAGNOSIS — I5022 Chronic systolic (congestive) heart failure: Secondary | ICD-10-CM | POA: Diagnosis not present

## 2021-01-31 DIAGNOSIS — R1312 Dysphagia, oropharyngeal phase: Secondary | ICD-10-CM | POA: Diagnosis not present

## 2021-01-31 DIAGNOSIS — G9341 Metabolic encephalopathy: Secondary | ICD-10-CM | POA: Diagnosis not present

## 2021-01-31 DIAGNOSIS — N183 Chronic kidney disease, stage 3 unspecified: Secondary | ICD-10-CM | POA: Diagnosis not present

## 2021-01-31 DIAGNOSIS — E782 Mixed hyperlipidemia: Secondary | ICD-10-CM | POA: Diagnosis not present

## 2021-01-31 DIAGNOSIS — M1A9XX0 Chronic gout, unspecified, without tophus (tophi): Secondary | ICD-10-CM | POA: Diagnosis not present

## 2021-01-31 DIAGNOSIS — K5901 Slow transit constipation: Secondary | ICD-10-CM | POA: Diagnosis not present

## 2021-01-31 DIAGNOSIS — M6281 Muscle weakness (generalized): Secondary | ICD-10-CM | POA: Diagnosis not present

## 2021-01-31 DIAGNOSIS — Z940 Kidney transplant status: Secondary | ICD-10-CM | POA: Diagnosis not present

## 2021-01-31 LAB — GLUCOSE, CAPILLARY: Glucose-Capillary: 192 mg/dL — ABNORMAL HIGH (ref 70–99)

## 2021-02-01 ENCOUNTER — Emergency Department (HOSPITAL_COMMUNITY): Payer: No Typology Code available for payment source

## 2021-02-01 ENCOUNTER — Emergency Department (HOSPITAL_COMMUNITY)
Admission: EM | Admit: 2021-02-01 | Discharge: 2021-02-01 | Disposition: A | Discharge status: Home / Self Care | Payer: No Typology Code available for payment source | Attending: Emergency Medicine | Admitting: Emergency Medicine

## 2021-02-01 ENCOUNTER — Encounter (HOSPITAL_COMMUNITY): Payer: Self-pay | Admitting: Emergency Medicine

## 2021-02-01 DIAGNOSIS — E1122 Type 2 diabetes mellitus with diabetic chronic kidney disease: Secondary | ICD-10-CM | POA: Diagnosis not present

## 2021-02-01 DIAGNOSIS — I5032 Chronic diastolic (congestive) heart failure: Secondary | ICD-10-CM | POA: Diagnosis not present

## 2021-02-01 DIAGNOSIS — Z8546 Personal history of malignant neoplasm of prostate: Secondary | ICD-10-CM | POA: Insufficient documentation

## 2021-02-01 DIAGNOSIS — Y92129 Unspecified place in nursing home as the place of occurrence of the external cause: Secondary | ICD-10-CM | POA: Diagnosis not present

## 2021-02-01 DIAGNOSIS — E1143 Type 2 diabetes mellitus with diabetic autonomic (poly)neuropathy: Secondary | ICD-10-CM | POA: Insufficient documentation

## 2021-02-01 DIAGNOSIS — R464 Slowness and poor responsiveness: Secondary | ICD-10-CM | POA: Diagnosis present

## 2021-02-01 DIAGNOSIS — N1832 Chronic kidney disease, stage 3b: Secondary | ICD-10-CM | POA: Insufficient documentation

## 2021-02-01 DIAGNOSIS — W19XXXA Unspecified fall, initial encounter: Secondary | ICD-10-CM | POA: Insufficient documentation

## 2021-02-01 DIAGNOSIS — I130 Hypertensive heart and chronic kidney disease with heart failure and stage 1 through stage 4 chronic kidney disease, or unspecified chronic kidney disease: Secondary | ICD-10-CM | POA: Diagnosis not present

## 2021-02-01 DIAGNOSIS — R4182 Altered mental status, unspecified: Secondary | ICD-10-CM | POA: Diagnosis not present

## 2021-02-01 DIAGNOSIS — I2510 Atherosclerotic heart disease of native coronary artery without angina pectoris: Secondary | ICD-10-CM | POA: Diagnosis not present

## 2021-02-01 DIAGNOSIS — E1165 Type 2 diabetes mellitus with hyperglycemia: Secondary | ICD-10-CM | POA: Diagnosis not present

## 2021-02-01 DIAGNOSIS — E1140 Type 2 diabetes mellitus with diabetic neuropathy, unspecified: Secondary | ICD-10-CM | POA: Insufficient documentation

## 2021-02-01 LAB — CBC WITH DIFFERENTIAL/PLATELET
Abs Immature Granulocytes: 0.02 10*3/uL (ref 0.00–0.07)
Basophils Absolute: 0 10*3/uL (ref 0.0–0.1)
Basophils Relative: 0 %
Eosinophils Absolute: 0.1 10*3/uL (ref 0.0–0.5)
Eosinophils Relative: 2 %
HCT: 55.1 % — ABNORMAL HIGH (ref 39.0–52.0)
Hemoglobin: 16 g/dL (ref 13.0–17.0)
Immature Granulocytes: 0 %
Lymphocytes Relative: 12 %
Lymphs Abs: 0.6 10*3/uL — ABNORMAL LOW (ref 0.7–4.0)
MCH: 26.3 pg (ref 26.0–34.0)
MCHC: 29 g/dL — ABNORMAL LOW (ref 30.0–36.0)
MCV: 90.6 fL (ref 80.0–100.0)
Monocytes Absolute: 0.5 10*3/uL (ref 0.1–1.0)
Monocytes Relative: 10 %
Neutro Abs: 3.4 10*3/uL (ref 1.7–7.7)
Neutrophils Relative %: 76 %
Platelets: 267 10*3/uL (ref 150–400)
RBC: 6.08 MIL/uL — ABNORMAL HIGH (ref 4.22–5.81)
RDW: 15.6 % — ABNORMAL HIGH (ref 11.5–15.5)
WBC: 4.5 10*3/uL (ref 4.0–10.5)
nRBC: 0 % (ref 0.0–0.2)

## 2021-02-01 LAB — COMPREHENSIVE METABOLIC PANEL
ALT: 22 U/L (ref 0–44)
AST: 15 U/L (ref 15–41)
Albumin: 3 g/dL — ABNORMAL LOW (ref 3.5–5.0)
Alkaline Phosphatase: 71 U/L (ref 38–126)
Anion gap: 9 (ref 5–15)
BUN: 26 mg/dL — ABNORMAL HIGH (ref 8–23)
CO2: 22 mmol/L (ref 22–32)
Calcium: 10.3 mg/dL (ref 8.9–10.3)
Chloride: 117 mmol/L — ABNORMAL HIGH (ref 98–111)
Creatinine, Ser: 1.71 mg/dL — ABNORMAL HIGH (ref 0.61–1.24)
GFR, Estimated: 42 mL/min — ABNORMAL LOW (ref >60)
Glucose, Bld: 230 mg/dL — ABNORMAL HIGH (ref 70–99)
Potassium: 4.4 mmol/L (ref 3.5–5.1)
Sodium: 148 mmol/L — ABNORMAL HIGH (ref 135–145)
Total Bilirubin: 1 mg/dL (ref 0.3–1.2)
Total Protein: 6.7 g/dL (ref 6.5–8.1)

## 2021-02-01 LAB — URINALYSIS, ROUTINE W REFLEX MICROSCOPIC
Bilirubin Urine: NEGATIVE
Glucose, UA: 100 mg/dL — AB
Ketones, ur: NEGATIVE mg/dL
Leukocytes,Ua: NEGATIVE
Nitrite: NEGATIVE
Protein, ur: NEGATIVE mg/dL
Specific Gravity, Urine: 1.02 (ref 1.005–1.030)
pH: 6 (ref 5.0–8.0)

## 2021-02-01 LAB — PROTIME-INR
INR: 1.1 (ref 0.8–1.2)
Prothrombin Time: 14 seconds (ref 11.4–15.2)

## 2021-02-01 LAB — URINALYSIS, MICROSCOPIC (REFLEX): RBC / HPF: >50 RBC/hpf (ref 0–5)

## 2021-02-01 NOTE — ED Provider Notes (Signed)
Emergency Department Provider Note   I have reviewed the triage vital signs and the nursing notes.   HISTORY  Chief Complaint No chief complaint on file.   HPI [name] is a 70 y.o. male with PMH reviewed below presents to the emergency department with fall at the nursing facility.  He was reportedly found on the ground at [facility]. The fall was not witnessed.  He was recently discharged in the hospital 3 days ago after presenting with encephalopathy found to be multifactorial. Described as non-verbal with poor interaction during that admission which is how he presents today.   Level 5 caveat: Non-verbal status.   Past Medical History:  Diagnosis Date   Allergy    Angina    Arthritis    Backache, unspecified    CHF (congestive heart failure) ([place])    Chills    Chronic kidney disease, stage III (moderate) ([place])    HD T- TH-SAT   Complication of anesthesia    01/2011 could not eat,, hospt x2, was placed on hd and cleared up   Coronary atherosclerosis of native coronary artery    Diabetes mellitus 2004   Dizziness    Dysphagia, unspecified(787.20)    Fall at home 11/2019   Gastroparesis    Headache(784.0)    Hemorrhage of rectum and anus    Hyperlipidemia    Hypertrophy of prostate with urinary obstruction and other lower urinary tract symptoms (LUTS)    INTERNAL HEMORRHOIDS 10/16/2008   Qualifier: Diagnosis of  By: Nolon Rod CMA (AAMA), Robin     Internal hemorrhoids without mention of complication    Myocardial infarction ([place]) 2002   Nausea alone    Obesity    Other dyspnea and respiratory abnormality    Other malaise and fatigue    Personal history of unspecified circulatory disease    Posttraumatic stress disorder    Prostate cancer ([place])    Sleep apnea    USES CPAP    Stroke ([place])    2007   Unspecified essential hypertension    hx htn     Patient Active Problem List   Diagnosis Date Noted   Chronic diastolic CHF (congestive heart failure)  ([place]) 01/22/2021   Anxiety 01/22/2021   Thyroid nodule 01/22/2021   Physical deconditioning 01/22/2021   Acute encephalopathy [id number]   Acute metabolic encephalopathy [id number]   Acute renal failure superimposed on stage 3b chronic kidney disease ([place]) 01/14/2021   DM (diabetes mellitus) type II controlled with renal manifestation ([place]) 01/14/2021   AKI (acute kidney injury) ([place]) 01/13/2021   Right hemiplegia ([place]) 11/22/2020   Acute kidney injury superimposed on chronic kidney disease ([place]) 10/08/2020   Frequent falls 10/08/2020   Stable angina ([place]) 09/16/2020   Pre-operative clearance 06/20/2020   Protrusion of lumbar intervertebral disc    Malignant neoplasm of prostate ([place]) 06/09/2019   PAD (peripheral artery disease) ([place]) 01/16/2019   Spinal stenosis of lumbar region 09/21/2018   Morbid obesity ([place]) 12/04/2017   Mild cognitive impairment 06/18/2016   Hyperlipidemia 01/16/2016   Immunosuppressive management encounter following kidney transplant 07/12/2015   Renal transplant recipient 06/27/2015   Immunosuppression ([place]) 06/27/2015   Thoracic aortic aneurysm ([place]) 10/23/2014   GERD (gastroesophageal reflux disease) 05/16/2014   Trigger thumb of right hand 01/03/2014   Left foot drop 05/02/2013   ED (erectile dysfunction) 04/03/2013   H/O iron deficiency anemia 02/10/2012   Risk for falls 01/22/2012   Diabetic neuropathy ([place]) 10/14/2010   Gout 12/22/2009  Obstructive sleep apnea 07/10/2009   CAD -s/p DES to marginal vessel at [facility] 04/09/2009   BPH (benign prostatic hyperplasia) 09/25/2008   Gastroparesis 06/19/2008   Low back pain 12/15/2007   Posttraumatic stress disorder 11/03/2007   Diabetes mellitus due to underlying condition with diabetic autonomic (poly)neuropathy ([place]) 09/05/2007   Allergic rhinitis 03/01/2007   Essential hypertension 01/17/2007   History of stroke 01/17/2007    Past Surgical History:  Procedure Laterality Date   ANAL  FISSURECTOMY     AV FISTULA PLACEMENT     CARDIAC CATHETERIZATION     2005 DR BRODIE   HEMORRHOID SURGERY     kindey transplant  05/2017   PROSTATE BIOPSY     revision of fistula     renal failure   VENTRAL HERNIA REPAIR  07/01/2011   Procedure: HERNIA REPAIR VENTRAL ADULT;  Surgeon: Joyice Faster. Cornett, MD;  Location: [facility] OR;  Service: General;  Laterality: N/A;    Allergies Codeine  Family History  Problem Relation Age of Onset   Heart disease Father    Renal Disease Father    Dementia Mother    Heart attack Mother    Diabetes Sister    Heart disease Sister    Diabetes Maternal Aunt    Diabetes Maternal Uncle    Diabetes Paternal Aunt    Diabetes Paternal Uncle    Heart disease Other    Heart attack Brother    Lung cancer Sister    Renal Disease Brother    Ovarian cancer Cousin    Lung cancer Cousin    Breast cancer Neg Hx    Colon cancer Neg Hx    Pancreatic cancer Neg Hx    Prostate cancer Neg Hx     Social History Social History   Tobacco Use   Smoking status: Never   Smokeless tobacco: Never  Vaping Use   Vaping Use: Never used  Substance Use Topics   Alcohol use: No   Drug use: No    Review of Systems  Level 5 caveat: Non-verbal.   ____________________________________________   PHYSICAL EXAM:  VITAL SIGNS: ED Triage Vitals [02/01/21 1648]  Enc Vitals Group     BP 130/79     Pulse Rate 87     Resp 18     Temp 98.2 F (36.8 C)     Temp Source Oral     SpO2 96 %   Constitutional: Alert. Will make brief eye contact but not responding verbally. Not reliably following commands.  Eyes: Conjunctivae are normal. PERRL.  Head: Atraumatic. Nose: No congestion/rhinnorhea. Mouth/Throat: Mucous membranes are moist.  Neck: No stridor.  Cardiovascular: Normal rate, regular rhythm. Good peripheral circulation. Grossly normal heart sounds.   Respiratory: Normal respiratory effort.  No retractions. Lungs CTAB. Gastrointestinal: Soft and nontender. No  distention.  Musculoskeletal: No lower extremity tenderness nor edema. No gross deformities of extremities. Neurologic:  Non-verbal. Moving extremities equally but with non-verbal status, exam is limited.  Skin:  Skin is warm, dry and intact. No rash noted.   ____________________________________________   LABS (all labs ordered are listed, but only abnormal results are displayed)  Labs Reviewed  CBC WITH DIFFERENTIAL/PLATELET - Abnormal; Notable for the following components:      Result Value   RBC 6.08 (*)    HCT 55.1 (*)    MCHC 29.0 (*)    RDW 15.6 (*)    Lymphs Abs 0.6 (*)    All other components within normal limits  COMPREHENSIVE METABOLIC PANEL - Abnormal; Notable for the following components:   Sodium 148 (*)    Chloride 117 (*)    Glucose, Bld 230 (*)    BUN 26 (*)    Creatinine, Ser 1.71 (*)    Albumin 3.0 (*)    GFR, Estimated 42 (*)    All other components within normal limits  URINALYSIS, ROUTINE W REFLEX MICROSCOPIC - Abnormal; Notable for the following components:   Glucose, UA 100 (*)    Hgb urine dipstick LARGE (*)    All other components within normal limits  URINALYSIS, MICROSCOPIC (REFLEX) - Abnormal; Notable for the following components:   Bacteria, UA RARE (*)    All other components within normal limits  RESP PANEL BY RT-PCR (FLU A&B, COVID) ARPGX2  URINE CULTURE  PROTIME-INR  PROTIME-INR   ____________________________________________  EKG  None  ____________________________________________  RADIOLOGY  CT head, CXR, and Pelvis x-ray reviewed.   ____________________________________________   PROCEDURES  Procedure(s) performed:   Procedures  None  ____________________________________________   INITIAL IMPRESSION / ASSESSMENT AND PLAN / ED COURSE  Pertinent labs & imaging results that were available during my care of the patient were reviewed by me and considered in my medical decision making (see chart for details).   Patient  presents emergency department after a fall at his nursing facility.  His mental status as described above does appear to be his new baseline.  He had extensive inpatient work-up earlier this month for encephalopathy thought to be ultimately multifactorial but not significantly reversing.  As the fall was unwitnessed and the patient is unable to bite significant history will obtain imaging of the head and neck along with plain films of the chest and pelvis although no clear injury identified.  We will also obtain screening blood work and UA for comparison to recent values to see if there is some change in his metabolic status that would explain fall or persistent mental status change.   Labs show mild hypernatremia and baseline CKD.  Patient with hyperglycemia but no DKA. UA w/ Hb but no other sign of infection. Will send for culture. AMS appears baseline per review or recent admit notes. Plan for return to facility with strict ED return precautions.  ____________________________________________  FINAL CLINICAL IMPRESSION(S) / ED DIAGNOSES  Final diagnoses:  Fall, initial encounter      Note:  This document was prepared using Dragon voice recognition software and may include unintentional dictation errors.  Nanda Quinton, MD, [facility] Emergency Medicine    Maythe Deramo, Wonda Olds, MD 2021/02/13 [phone]

## 2021-02-01 NOTE — ED Notes (Signed)
PT changed and bathed in the bed, pads and brief changed. TV on lights dimmed bed in low position and locked

## 2021-02-01 NOTE — ED Provider Notes (Signed)
Emergency Medicine Provider Triage Evaluation Note  [name] , a 70 y.o. male  was evaluated in triage.  Pt complains of multiple falls, was found to the floor by staff at [facility].  Patient is nonverbal at baseline.  Review of Systems  Positive:  Negative:   Physical Exam  There were no vitals taken for this visit. Gen:   Awake, no distress   Resp:  Normal effort  MSK:   Moves extremities without difficulty  Other:    Medical Decision Making  Medically screening exam initiated at 4:48 PM.  Appropriate orders placed.  [name] was informed that the remainder of the evaluation will be completed by another provider, this initial triage assessment does not replace that evaluation, and the importance of remaining in the ED until their evaluation is complete.  Patient here status post fall was unwitnessed on Plavix.  Nonverbal at baseline.  No obvious injury noted.   Janeece Fitting, PA-C 02/01/21 1649    Long, Wonda Olds, MD 02/09/21 [phone]

## 2021-02-01 NOTE — ED Notes (Signed)
Called facility and spoke with front desk who then transferred me to back where pt room was and there was no answer and I was unable to leave a message

## 2021-02-01 NOTE — ED Notes (Signed)
Called facility and spoke with Katie at front desk to let her know pt would be coming back. She transferred me to the back and again there was no answer

## 2021-02-01 NOTE — Discharge Instructions (Addendum)
You were seen in the emergency department today after a fall.  CT scans of your head and neck did not show any fractures or bleeding.  Your lab work and urine tests were normal.  Please continue your current therapies and medications.  Follow closely with your primary care doctor.

## 2021-02-01 NOTE — ED Notes (Signed)
[facility] called  

## 2021-02-01 NOTE — ED Triage Notes (Addendum)
Pt to triage via [place] from [facility].  Pt found lying in the floor by staff.  ? Hit head.  Takes Plavix.  Pt alert.  Non-verbal.  Unable to follow commands.  No obvious injury noted by EMS.  C-collar in place.

## 2021-02-02 LAB — PROTIME-INR

## 2021-02-03 DIAGNOSIS — K219 Gastro-esophageal reflux disease without esophagitis: Secondary | ICD-10-CM | POA: Diagnosis not present

## 2021-02-03 DIAGNOSIS — N183 Chronic kidney disease, stage 3 unspecified: Secondary | ICD-10-CM | POA: Diagnosis not present

## 2021-02-03 DIAGNOSIS — R1312 Dysphagia, oropharyngeal phase: Secondary | ICD-10-CM | POA: Diagnosis not present

## 2021-02-03 DIAGNOSIS — I5022 Chronic systolic (congestive) heart failure: Secondary | ICD-10-CM | POA: Diagnosis not present

## 2021-02-03 DIAGNOSIS — M6281 Muscle weakness (generalized): Secondary | ICD-10-CM | POA: Diagnosis not present

## 2021-02-03 DIAGNOSIS — E041 Nontoxic single thyroid nodule: Secondary | ICD-10-CM | POA: Diagnosis not present

## 2021-02-03 DIAGNOSIS — N390 Urinary tract infection, site not specified: Secondary | ICD-10-CM | POA: Diagnosis not present

## 2021-02-03 DIAGNOSIS — E1122 Type 2 diabetes mellitus with diabetic chronic kidney disease: Secondary | ICD-10-CM | POA: Diagnosis not present

## 2021-02-03 DIAGNOSIS — M1A9XX0 Chronic gout, unspecified, without tophus (tophi): Secondary | ICD-10-CM | POA: Diagnosis not present

## 2021-02-03 DIAGNOSIS — I6350 Cerebral infarction due to unspecified occlusion or stenosis of unspecified cerebral artery: Secondary | ICD-10-CM | POA: Diagnosis not present

## 2021-02-04 LAB — URINE CULTURE: Culture: 50000 — AB

## 2021-02-05 ENCOUNTER — Telehealth: Payer: Self-pay | Admitting: *Deleted

## 2021-02-05 DIAGNOSIS — I10 Essential (primary) hypertension: Secondary | ICD-10-CM | POA: Diagnosis not present

## 2021-02-05 DIAGNOSIS — Z940 Kidney transplant status: Secondary | ICD-10-CM | POA: Diagnosis not present

## 2021-02-05 DIAGNOSIS — I501 Left ventricular failure: Secondary | ICD-10-CM | POA: Diagnosis not present

## 2021-02-05 DIAGNOSIS — R1312 Dysphagia, oropharyngeal phase: Secondary | ICD-10-CM | POA: Diagnosis not present

## 2021-02-05 DIAGNOSIS — E041 Nontoxic single thyroid nodule: Secondary | ICD-10-CM | POA: Diagnosis not present

## 2021-02-05 DIAGNOSIS — E782 Mixed hyperlipidemia: Secondary | ICD-10-CM | POA: Diagnosis not present

## 2021-02-05 DIAGNOSIS — K5901 Slow transit constipation: Secondary | ICD-10-CM | POA: Diagnosis not present

## 2021-02-05 DIAGNOSIS — I5022 Chronic systolic (congestive) heart failure: Secondary | ICD-10-CM | POA: Diagnosis not present

## 2021-02-05 DIAGNOSIS — N183 Chronic kidney disease, stage 3 unspecified: Secondary | ICD-10-CM | POA: Diagnosis not present

## 2021-02-05 DIAGNOSIS — E1122 Type 2 diabetes mellitus with diabetic chronic kidney disease: Secondary | ICD-10-CM | POA: Diagnosis not present

## 2021-02-05 DIAGNOSIS — E7220 Disorder of urea cycle metabolism, unspecified: Secondary | ICD-10-CM | POA: Diagnosis not present

## 2021-02-05 DIAGNOSIS — I6350 Cerebral infarction due to unspecified occlusion or stenosis of unspecified cerebral artery: Secondary | ICD-10-CM | POA: Diagnosis not present

## 2021-02-05 DIAGNOSIS — G9341 Metabolic encephalopathy: Secondary | ICD-10-CM | POA: Diagnosis not present

## 2021-02-17 ENCOUNTER — Ambulatory Visit: Payer: No Typology Code available for payment source | Admitting: Family Medicine

## 2021-02-22 NOTE — Telephone Encounter (Signed)
Post ED Visit - Positive Culture Follow-up  Culture report reviewed by antimicrobial stewardship pharmacist: [place] Team '[]'$  Elenor Quinones, Pharm.D. '[]'$  Heide Guile, Pharm.D., BCPS AQ-ID '[]'$  Parks Neptune, Pharm.D., BCPS '[]'$  Alycia Rossetti, Pharm.D., BCPS '[]'$  Kerri, Pharm.D., BCPS, AAHIVP '[]'$  Legrand Como, Pharm.D., BCPS, AAHIVP '[]'$  Salome Arnt, PharmD, BCPS '[]'$  Johnnette Gourd, PharmD, BCPS '[]'$  Hughes Better, PharmD, BCPS '[]'$  Leeroy Cha, PharmD '[]'$  Laqueta Linden, PharmD, BCPS '[]'$  Albertina Parr, PharmD  [place] Team '[]'$  Leodis Sias, PharmD '[]'$  Lindell Spar, PharmD '[]'$  Royetta Asal, PharmD '[]'$  Graylin Shiver, Rph '[]'$  Rema Fendt) Glennon Mac, PharmD '[]'$  Arlyn Dunning, PharmD '[]'$  Netta Cedars, PharmD '[]'$  Dia Sitter, PharmD '[]'$  Leone Haven, PharmD '[]'$  Gretta Arab, PharmD '[]'$  Theodis Shove, PharmD '[]'$  Peggyann Juba, PharmD '[]'$  Reuel Boom, PharmD   Positive urine culture No treatment needed and no further patient follow-up is required at this time.  Joetta Manners, Pharm d  Harlon Flor Talley 02/22/21, 8:25 AM

## 2021-02-22 DEATH — deceased

## 2021-02-28 ENCOUNTER — Ambulatory Visit: Payer: Medicare Other | Admitting: Adult Health

## 2021-03-07 ENCOUNTER — Ambulatory Visit: Payer: Medicare Other | Admitting: Neurology

## 2021-03-11 ENCOUNTER — Ambulatory Visit: Payer: Medicare Other | Admitting: Cardiology

## 2021-04-06 IMAGING — MR MR LUMBAR SPINE W/O CM
4 of 5 series · 30 of 48 positions shown · non-contrast
Comparison: 12/19/2019

CLINICAL DATA: Low back pain with cauda equina suspected

EXAM:
MRI LUMBAR SPINE WITHOUT CONTRAST
TECHNIQUE: Multiplanar, multisequence MR imaging of the lumbar spine was
performed. No intravenous contrast was administered.

[Series 5: T1 · sagittal · 4.0mm · 0.81mm/px · 6 of 15 slices shown (1 of 2)]
[im 1/15]
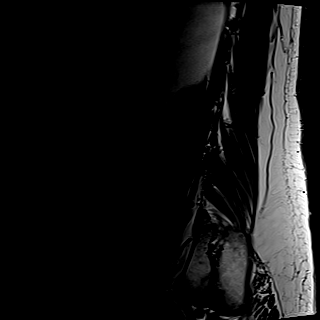
[im 3/15]
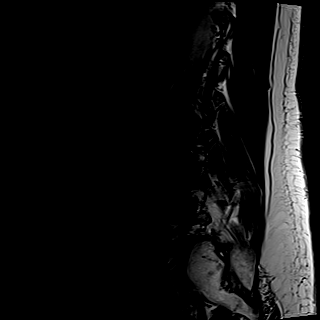
[im 6/15]
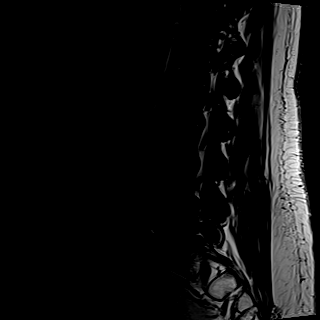
[im 9/15]
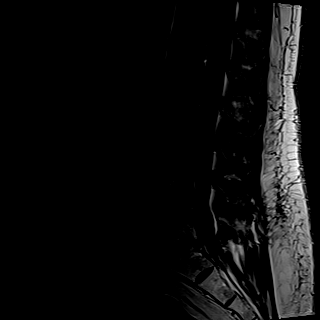
[im 12/15]
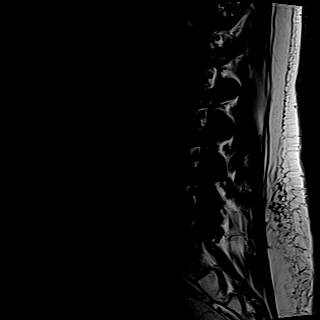
[im 15/15]
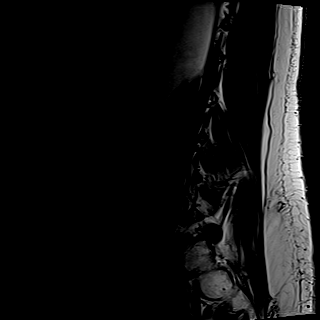

[Series 6: T2 · sagittal · 4.0mm · 0.81mm/px · 5 of 15 slices shown (1 of 2)]
[im 1/15]
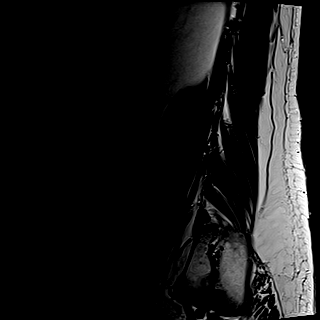
[im 4/15]
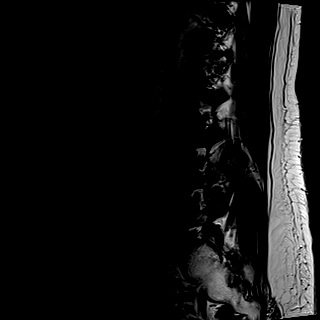
[im 8/15]
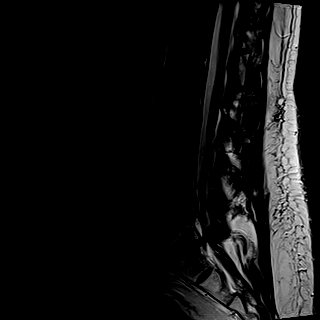
[im 11/15]
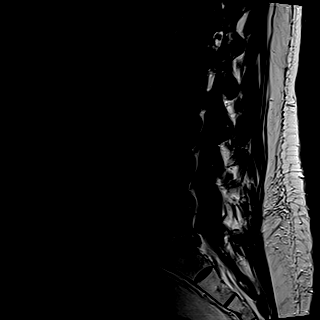
[im 15/15]
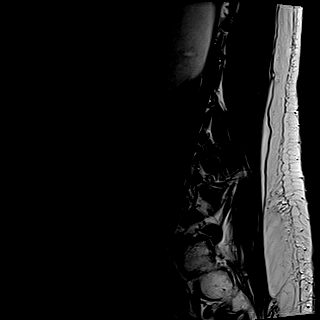

[Series 8: T2 · axial · 4.0mm · 0.62mm/px · z∈[-20,+189]mm · 10 of 45 slices shown (2 of 2)]
[im 3/45]
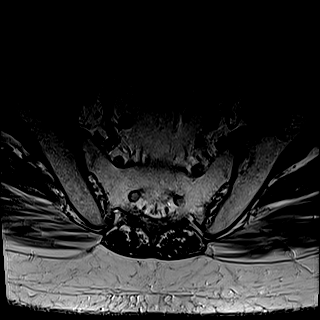
[im 6/45]
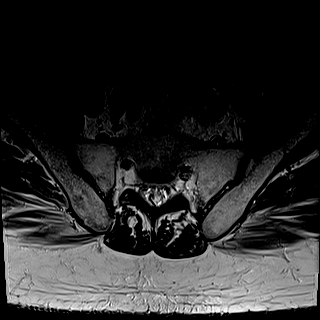
[im 9/45]
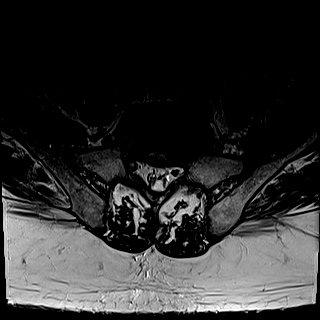
[im 15/45]
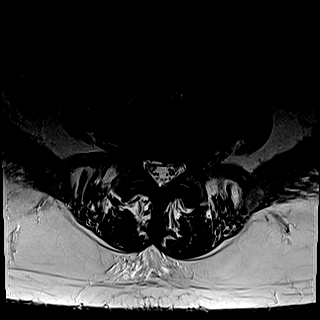
[im 21/45]
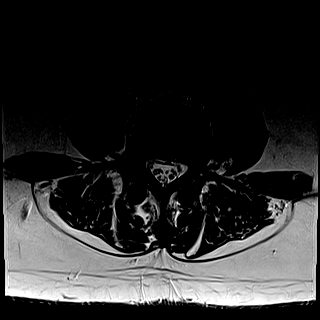
[im 24/45]
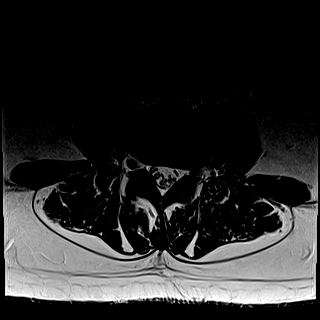
[im 27/45]
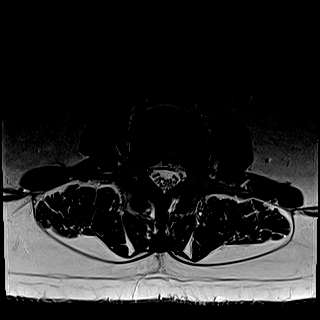
[im 33/45]
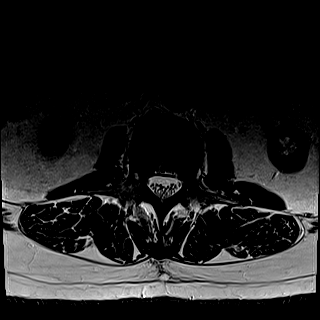
[im 39/45]
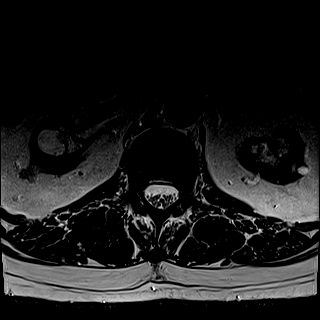
[im 45/45]
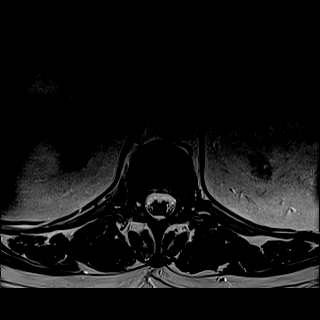

[Series 9: T1 · axial · 4.0mm · 0.39mm/px · z∈[-20,+159]mm · 9 of 45 slices shown (2 of 2)]
[im 3/45]
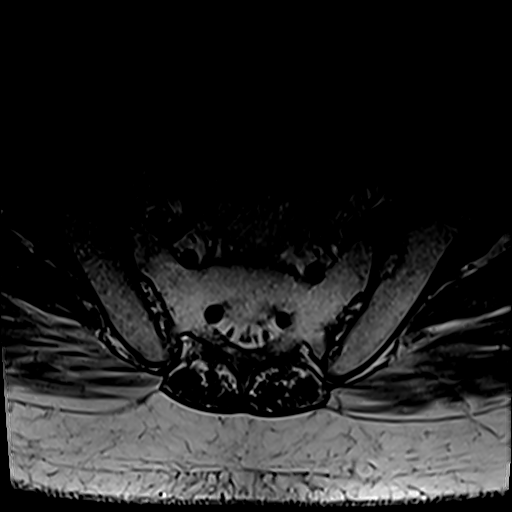
[im 6/45]
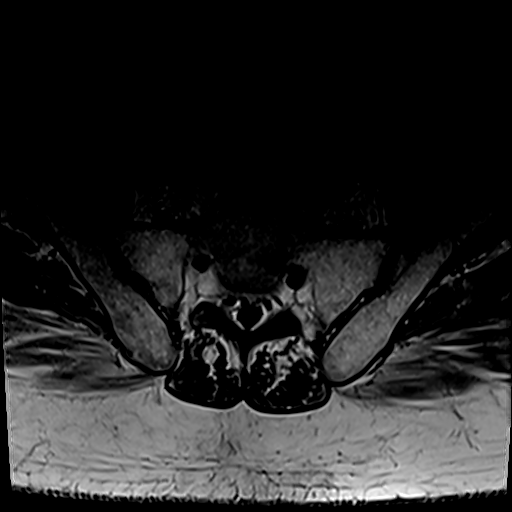
[im 9/45]
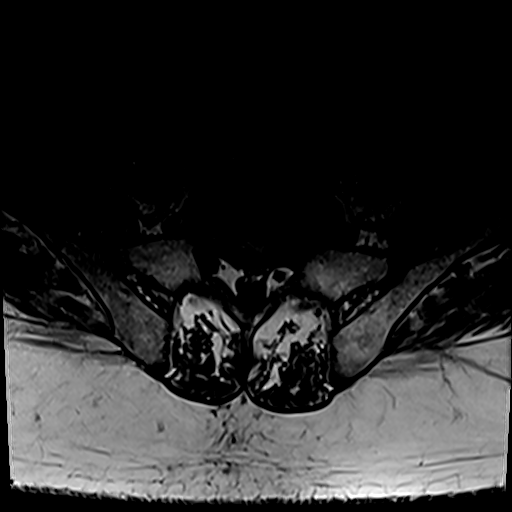
[im 15/45]
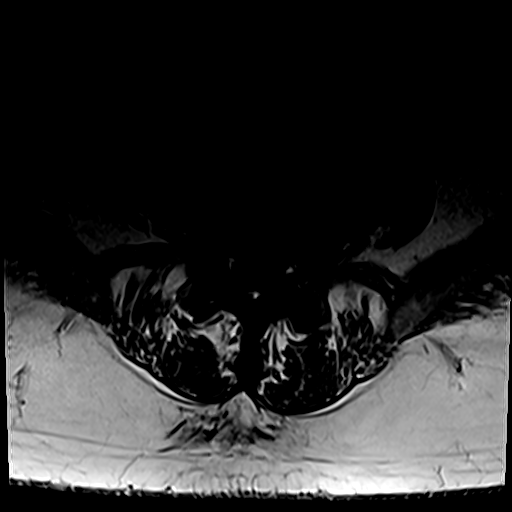
[im 21/45]
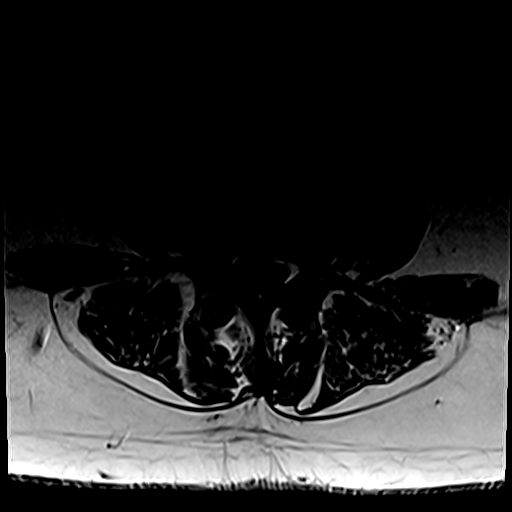
[im 24/45]
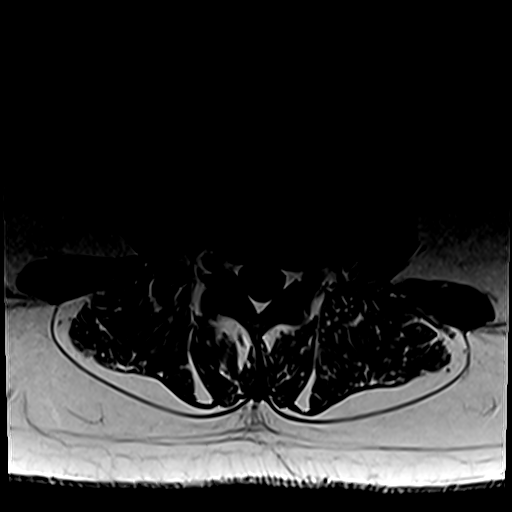
[im 27/45]
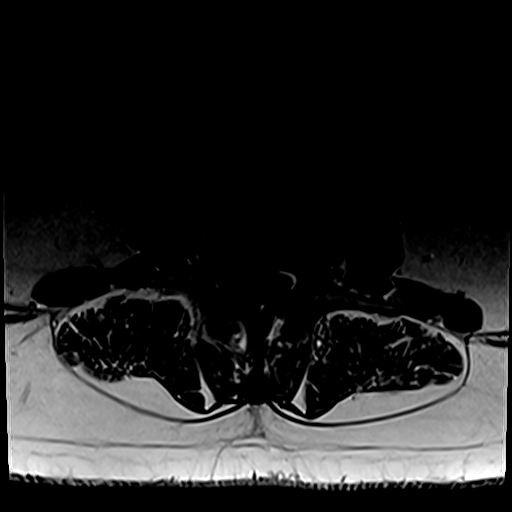
[im 33/45]
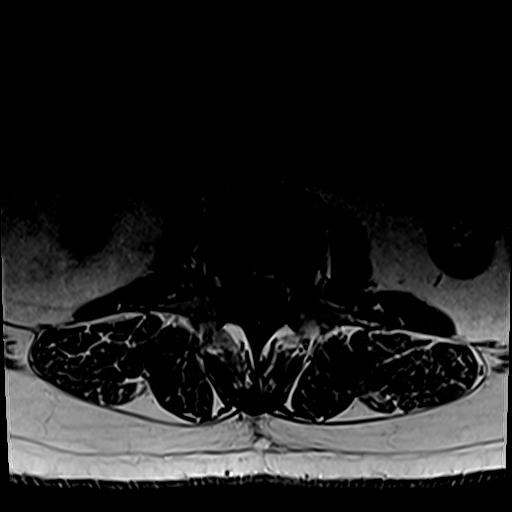
[im 39/45]
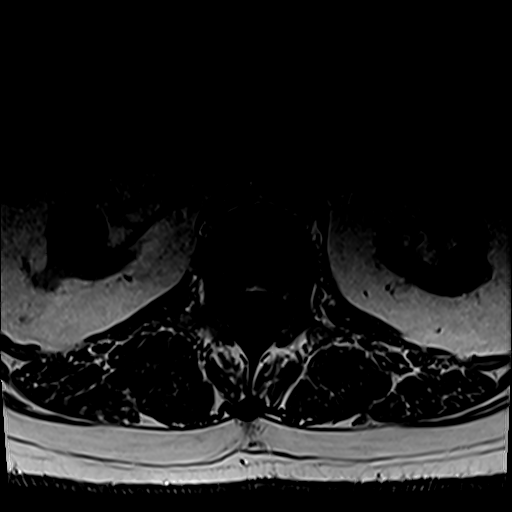

[30 of 48 positions shown; findings below may reference images not displayed]

FINDINGS: Segmentation:  Standard.

Alignment:  Borderline anterolisthesis at L4-5.

Vertebrae: No fracture, evidence of discitis, or bone lesion.
Prominent fatty marrow at L5 and below, question prior pelvic
radiotherapy. The appearance is unchanged.

Conus medullaris and cauda equina: Conus extends to the L1 level.
Conus and cauda equina appear normal.

Paraspinal and other soft tissues: Partially covered right pelvic
transplant kidney. Severe native renal atrophy with cystic areas of
various complexity and an isointense exophytic lesion extending from
the interpolar right kidney, unchanged from an abdominal MRI in
0303.

Disc levels:

T12- L1: Unremarkable.

L1-L2: Unremarkable.

L2-L3: Mild disc narrowing and bulging. Mild degenerative facet
spurring.

L3-L4: Bilateral degenerative facet spurring.

L4-L5: Facet osteoarthritis with spurring and borderline
anterolisthesis. The disc is bulging with a shallow left foraminal
protrusion. No neural compression or interval change.

L5-S1:Disc narrowing and desiccation with bulge and ridging.
Degenerative facet spurring on both sides. Advanced right foraminal
impingement which is likely exacerbated by tethered appearance of
the nerve roots from conjoined right S1 and L5 root sleeves.
Likewise, the right S1 nerve root exits the canal earlier and is
impinged upon by disc bulging and facet spurring. Mild to moderate
left foraminal narrowing. Patent spinal canal with mild thecal sac
narrowing from epidural fat expansion.
IMPRESSION: 1. No acute finding.  Diffusely patent spinal canal.
2. Right L5 and S1 neural impingement at the foramen and
subarticular recess due to degenerative disease and conjoined root
sleeves.
3. L4-5 facet osteoarthritis with mild anterolisthesis.

## 2021-07-28 ENCOUNTER — Ambulatory Visit: Payer: Medicare Other

## 2021-08-02 IMAGING — CR DG SHOULDER 2+V*L*
2 series · 2 of 2 positions shown · non-contrast
Comparison: None.

CLINICAL DATA: Recent fall with left shoulder pain, initial
encounter

EXAM:
LEFT SHOULDER - 2+ VIEW

[x shoulder ap left (1 of 2)]
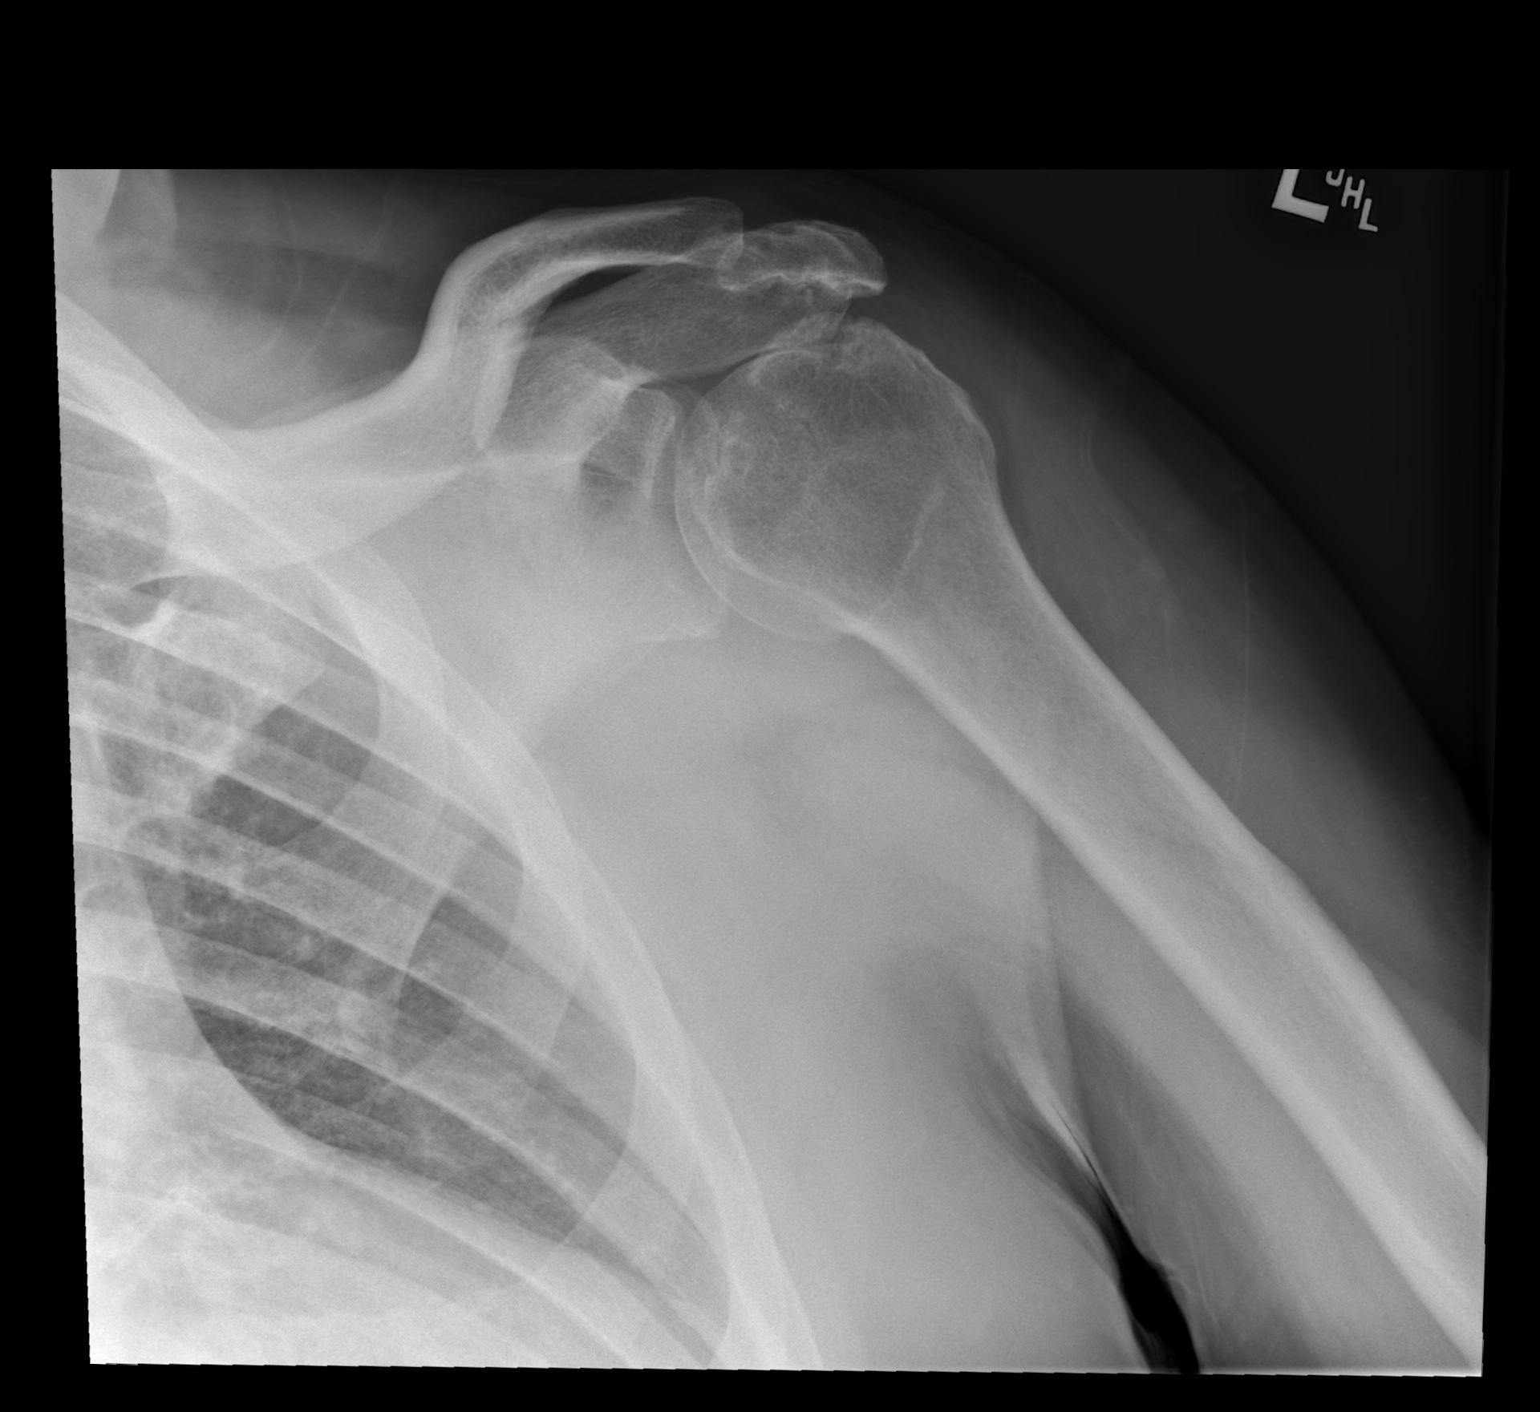

[x shoulder ap left (2 of 2)]
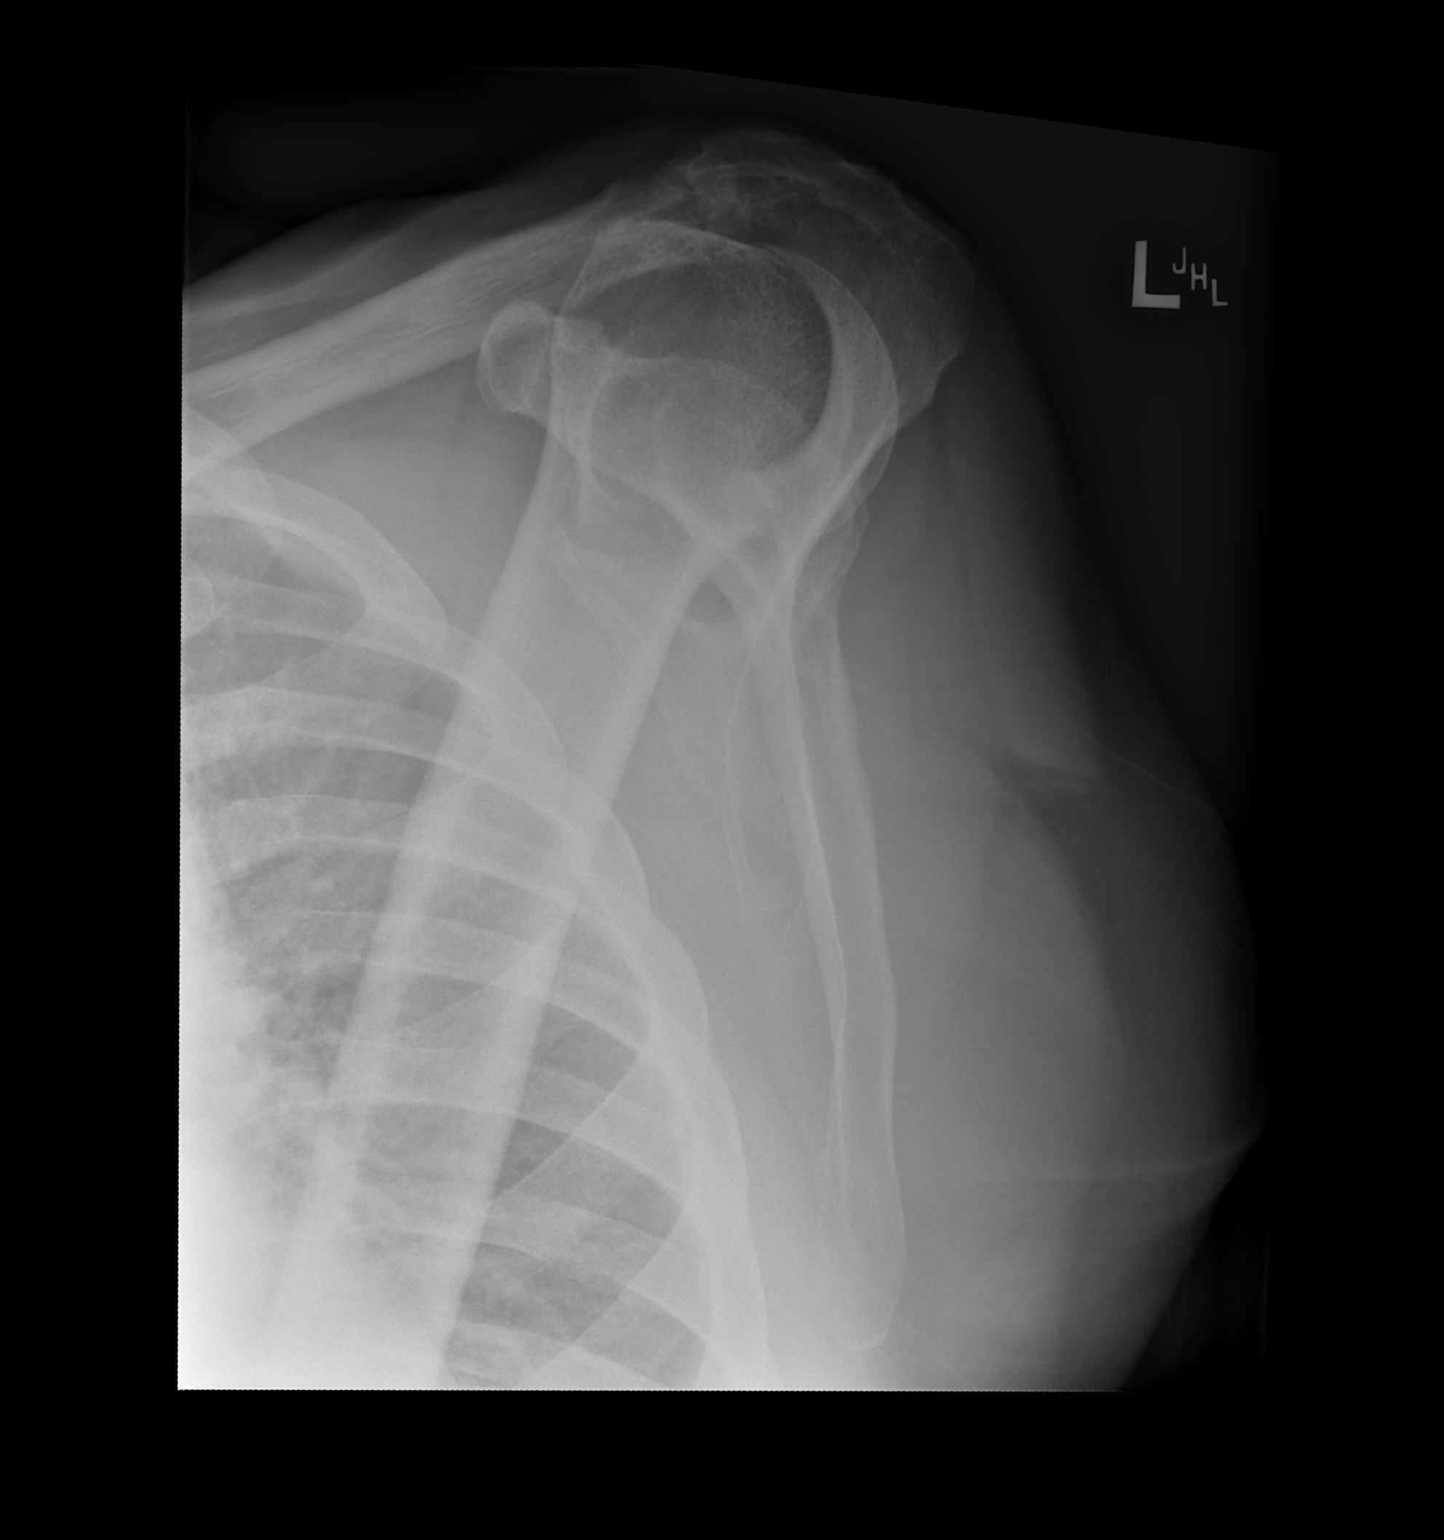

[2 of 2 positions shown; findings below may reference images not displayed]

FINDINGS: Degenerative changes of the acromioclavicular joint are noted. The
humeral head is somewhat high-riding articulating with acromion
which may be related to chronic underlying rotator cuff injury. Bony
thorax is within normal limits. No acute fracture is seen.
IMPRESSION: Degenerative change without acute abnormality.
